# Patient Record
Sex: Female | Born: 1945 | Race: Black or African American | Hispanic: No | Marital: Single | Smoking: Never smoker
Health system: Southern US, Community
[De-identification: ages and names within clinical notes are randomized; demographics above are authoritative.]

## PROBLEM LIST (undated history)

## (undated) DIAGNOSIS — I639 Cerebral infarction, unspecified: Secondary | ICD-10-CM

## (undated) DIAGNOSIS — E119 Type 2 diabetes mellitus without complications: Secondary | ICD-10-CM

## (undated) DIAGNOSIS — R4701 Aphasia: Secondary | ICD-10-CM

## (undated) DIAGNOSIS — E7521 Fabry (-Anderson) disease: Secondary | ICD-10-CM

## (undated) DIAGNOSIS — F329 Major depressive disorder, single episode, unspecified: Secondary | ICD-10-CM

## (undated) DIAGNOSIS — Z720 Tobacco use: Secondary | ICD-10-CM

## (undated) DIAGNOSIS — I4891 Unspecified atrial fibrillation: Secondary | ICD-10-CM

## (undated) DIAGNOSIS — I10 Essential (primary) hypertension: Secondary | ICD-10-CM

## (undated) DIAGNOSIS — F32A Depression, unspecified: Secondary | ICD-10-CM

## (undated) HISTORY — DX: Unspecified atrial fibrillation: I48.91

## (undated) HISTORY — DX: Aphasia: R47.01

## (undated) HISTORY — DX: Fabry (-anderson) disease: E75.21

## (undated) HISTORY — DX: Depression, unspecified: F32.A

## (undated) HISTORY — DX: Type 2 diabetes mellitus without complications: E11.9

## (undated) HISTORY — DX: Tobacco use: Z72.0

## (undated) HISTORY — DX: Major depressive disorder, single episode, unspecified: F32.9

---

## 1988-11-18 HISTORY — PX: OTHER SURGICAL HISTORY: SHX169

## 2006-02-28 ENCOUNTER — Emergency Department (HOSPITAL_COMMUNITY): Admission: EM | Admit: 2006-02-28 | Discharge: 2006-02-28 | Payer: Self-pay | Admitting: Family Medicine

## 2006-08-06 ENCOUNTER — Ambulatory Visit (HOSPITAL_COMMUNITY): Admission: RE | Admit: 2006-08-06 | Discharge: 2006-08-06 | Payer: Self-pay | Admitting: Obstetrics

## 2006-08-09 ENCOUNTER — Encounter: Admission: RE | Admit: 2006-08-09 | Discharge: 2006-08-09 | Payer: Self-pay | Admitting: Obstetrics

## 2006-08-28 ENCOUNTER — Encounter: Admission: RE | Admit: 2006-08-28 | Discharge: 2006-08-28 | Payer: Self-pay | Admitting: Obstetrics

## 2007-12-16 ENCOUNTER — Other Ambulatory Visit: Admission: RE | Admit: 2007-12-16 | Discharge: 2007-12-16 | Payer: Self-pay | Admitting: Gynecology

## 2007-12-26 ENCOUNTER — Ambulatory Visit (HOSPITAL_COMMUNITY): Admission: RE | Admit: 2007-12-26 | Discharge: 2007-12-26 | Payer: Self-pay | Admitting: Family Medicine

## 2007-12-30 ENCOUNTER — Encounter: Payer: Self-pay | Admitting: Gynecology

## 2007-12-30 ENCOUNTER — Ambulatory Visit (HOSPITAL_COMMUNITY): Admission: RE | Admit: 2007-12-30 | Discharge: 2007-12-30 | Payer: Self-pay | Admitting: Gynecology

## 2008-01-20 ENCOUNTER — Ambulatory Visit: Payer: Self-pay | Admitting: Gastroenterology

## 2009-08-23 ENCOUNTER — Encounter: Admission: RE | Admit: 2009-08-23 | Discharge: 2009-08-23 | Payer: Self-pay | Admitting: Family Medicine

## 2009-08-26 ENCOUNTER — Ambulatory Visit (HOSPITAL_COMMUNITY): Admission: RE | Admit: 2009-08-26 | Discharge: 2009-08-26 | Payer: Self-pay | Admitting: Family Medicine

## 2009-10-21 ENCOUNTER — Encounter: Payer: Self-pay | Admitting: Gastroenterology

## 2009-11-24 ENCOUNTER — Encounter (INDEPENDENT_AMBULATORY_CARE_PROVIDER_SITE_OTHER): Payer: Self-pay | Admitting: *Deleted

## 2009-11-25 ENCOUNTER — Encounter (INDEPENDENT_AMBULATORY_CARE_PROVIDER_SITE_OTHER): Payer: Self-pay | Admitting: *Deleted

## 2009-11-25 ENCOUNTER — Ambulatory Visit: Payer: Self-pay | Admitting: Gastroenterology

## 2009-12-09 ENCOUNTER — Ambulatory Visit: Payer: Self-pay | Admitting: Gastroenterology

## 2010-04-10 ENCOUNTER — Encounter: Payer: Self-pay | Admitting: Obstetrics

## 2010-04-19 NOTE — Letter (Signed)
Summary: Previsit letter  [facility]  [address] [place], [place] [address]   Phone: [phone]  Fax: [phone]       10/21/2009 MRN: [id number]  [place] [address] [place], [place]  [address]  Dear Ms. Mayford Knife,  Welcome to the Gastroenterology Division at [facility].    You are scheduled to see a nurse for your pre-procedure visit on 11-25-09 at 9AM on the 3rd floor at [facility], [address]  We ask that you try to arrive at our office 15 minutes prior to your appointment time to allow for check-in.  Your nurse visit will consist of discussing your medical and surgical history, your immediate family medical history, and your medications.    Please bring a complete list of all your medications or, if you prefer, bring the medication bottles and we will list them.  We will need to be aware of both prescribed and over the counter drugs.  We will need to know exact dosage information as well.  If you are on blood thinners (Coumadin, Plavix, Aggrenox, Ticlid, etc.) please call our office today/prior to your appointment, as we need to consult with your physician about holding your medication.   Please be prepared to read and sign documents such as consent forms, a financial agreement, and acknowledgement forms.  If necessary, and with your consent, a friend or relative is welcome to sit-in on the nurse visit with you.  Please bring your insurance card so that we may make a copy of it.  If your insurance requires a referral to see a specialist, please bring your referral form from your primary care physician.  No co-pay is required for this nurse visit.     If you cannot keep your appointment, please call [phone] to cancel or reschedule prior to your appointment date.  This allows [place] the opportunity to schedule an appointment for another patient in need of care.    Thank you for choosing [facility] for your medical  needs.  We appreciate the opportunity to care for you.  Please visit [place] at our website  to learn more about our practice.                     Sincerely.                                                                                                                   The Gastroenterology Division

## 2010-04-19 NOTE — Letter (Signed)
Summary: Diabetic Instructions  [facility]  [address] [place], [place] [address]   Phone: [phone]  Fax: [phone]    Rebecca [name] 08-16-1945 MRN: [id number]   _  _   ORAL DIABETIC MEDICATION INSTRUCTIONS  The day before your procedure:   Take your diabetic pill as you do normally  The day of your procedure:   Do not take your diabetic pill    We will check your blood sugar levels during the admission process and again in Recovery before discharging you home  ________________________________________________________________________

## 2010-04-19 NOTE — Letter (Signed)
Summary: [facility] Instructions  [facility]  [address] [place], [place] [address]   Phone: [phone]  Fax: [phone]       Rebecca [name]    04-18-1945    MRN: [id number]        Procedure Day /Date:  Thursday 12/09/2009     Arrival Time: 9:00 am      Procedure Time: 10:00 am     Location of Procedure:                    _x _   Endoscopy Center (4th Floor)                        PREPARATION FOR COLONOSCOPY WITH MOVIPREP   Starting 5 days prior to your procedure Saturday 9/17 do not eat nuts, seeds, popcorn, corn, beans, peas,  salads, or any raw vegetables.  Do not take any fiber supplements (e.g. Metamucil, Citrucel, and Benefiber).  THE DAY BEFORE YOUR PROCEDURE         DATE: Wednesday 9/21  1.  Drink clear liquids the entire day-NO SOLID FOOD  2.  Do not drink anything colored red or purple.  Avoid juices with pulp.  No orange juice.  3.  Drink at least 64 oz. (8 glasses) of fluid/clear liquids during the day to prevent dehydration and help the prep work efficiently.  CLEAR LIQUIDS INCLUDE: Water Jello Ice Popsicles Tea (sugar ok, no milk/cream) Powdered fruit flavored drinks Coffee (sugar ok, no milk/cream) Gatorade Juice: apple, white grape, white cranberry  Lemonade Clear bullion, consomm, broth Carbonated beverages (any kind) Strained chicken noodle soup Hard Candy                             4.  In the morning, mix first dose of MoviPrep solution:    Empty 1 Pouch A and 1 Pouch B into the disposable container    Add lukewarm drinking water to the top line of the container. Mix to dissolve    Refrigerate (mixed solution should be used within 24 hrs)  5.  Begin drinking the prep at 5:00 p.m. The MoviPrep container is divided by 4 marks.   Every 15 minutes drink the solution down to the next mark (approximately 8 oz) until the full liter is complete.   6.  Follow completed prep with 16 oz of clear liquid of your choice  (Nothing red or purple).  Continue to drink clear liquids until bedtime.  7.  Before going to bed, mix second dose of MoviPrep solution:    Empty 1 Pouch A and 1 Pouch B into the disposable container    Add lukewarm drinking water to the top line of the container. Mix to dissolve    Refrigerate  THE DAY OF YOUR PROCEDURE      DATE: Thursday 9/22  Beginning at 5:00 a.m. (5 hours before procedure):         1. Every 15 minutes, drink the solution down to the next mark (approx 8 oz) until the full liter is complete.  2. Follow completed prep with 16 oz. of clear liquid of your choice.    3. You may drink clear liquids until 8:00 am (2 HOURS BEFORE PROCEDURE).   MEDICATION INSTRUCTIONS  Unless otherwise instructed, you should take regular prescription medications with a small sip of water   as early as possible the morning of  your procedure.  Diabetic patients - see separate instructions.           OTHER INSTRUCTIONS  You will need a responsible adult at least 66 years of age to accompany you and drive you home.   This person must remain in the waiting room during your procedure.  Wear loose fitting clothing that is easily removed.  Leave jewelry and other valuables at home.  However, you may wish to bring a book to read or  an iPod/MP3 player to listen to music as you wait for your procedure to start.  Remove all body piercing jewelry and leave at home.  Total time from sign-in until discharge is approximately 2-3 hours.  You should go home directly after your procedure and rest.  You can resume normal activities the  day after your procedure.  The day of your procedure you should not:   Drive   Make legal decisions   Operate machinery   Drink alcohol   Return to work  You will receive specific instructions about eating, activities and medications before you leave.    The above instructions have been reviewed and explained to me by   Ezra Sites RN   September  66, 2011 9:26 AM     I fully understand and can verbalize these instructions _____________________________ Date _________

## 2010-04-19 NOTE — Procedures (Signed)
Summary: Colonoscopy  Patient: Rebecca [name] Note: All result statuses are Final unless otherwise noted.  Tests: (1) Colonoscopy (COL)   COL Colonoscopy           DONE     [facility]     [address]     [place], [place]  [address]           COLONOSCOPY PROCEDURE REPORT           PATIENT:  Rebecca [name], Rebecca [name]  MR#:  [id number]     BIRTHDATE:  10/02/1945, 64 yrs. old  GENDER:  female           ENDOSCOPIST:  Barbette Hair. Arlyce Dice, MD     Referred by:           PROCEDURE DATE:  12/09/2009     PROCEDURE:  Diagnostic Colonoscopy     ASA CLASS:  Class II     INDICATIONS:  1) Routine Risk Screening           MEDICATIONS:   Fentanyl 50 mcg IV, Versed 4 mg IV           DESCRIPTION OF PROCEDURE:   After the risks benefits and     alternatives of the procedure were thoroughly explained, informed     consent was obtained.  Digital rectal exam was performed and     revealed no abnormalities.   The LB PCF-H180AL [id number] endoscope     was introduced through the anus and advanced to the cecum, which     was identified by the ileocecal valve, without limitations.  The     quality of the prep was excellent, using MoviPrep.  The instrument     was then slowly withdrawn as the colon was fully examined.     <<PROCEDUREIMAGES>>           FINDINGS:  A normal appearing cecum, ileocecal valve, and     appendiceal orifice were identified. The ascending, hepatic     flexure, transverse, splenic flexure, descending, sigmoid colon,     and rectum appeared unremarkable (see image2, image4, image5,     image6, image7, image8, image10, image11, and image12).     Retroflexed views in the rectum revealed no abnormalities.    The     time to cecum =  9.25  minutes. The scope was then withdrawn (time     =  6.50  min) from the patient and the procedure completed.           COMPLICATIONS:  None           ENDOSCOPIC IMPRESSION:     1) Normal colon     RECOMMENDATIONS:     1) Continue current  colorectal screening recommendations for     "routine risk" patients with a repeat colonoscopy in 10 years.           REPEAT EXAM:  In 10 year(s) for Colonoscopy.           ______________________________     Barbette Hair. Arlyce Dice, MD           CC: Clyda Greener, MD           n.     Rosalie Doctor:   Barbette Hair. Kaplan at 12/09/2009 11:11 AM           Amado Coe, [phone]  Note: An exclamation mark (!) indicates a result that was not dispersed into the flowsheet. Document Creation Date: 12/09/2009 11:12 AM _______________________________________________________________________  (  1) Order result status: Final Collection or observation date-time: 12/09/2009 11:04 Requested date-time:  Receipt date-time:  Reported date-time:  Referring Physician:   Ordering Physician: Melvia Heaps [phone]) Specimen Source:  Source: Launa Grill Order Number: [id number] Lab site:   Appended Document: Colonoscopy    Clinical Lists Changes  Observations: Added new observation of COLONNXTDUE: 11/2019 (12/09/2009 14:07)

## 2010-04-19 NOTE — Miscellaneous (Signed)
Summary: [facility]  Clinical Lists Changes  Medications: Added new medication of MOVIPREP 100 GM  SOLR (PEG-KCL-NACL-NASULF-NA ASC-C) As per prep instructions. - Signed Rx of MOVIPREP 100 GM  SOLR (PEG-KCL-NACL-NASULF-NA ASC-C) As per prep instructions.;  #1 x 0;  Signed;  Entered by: Ezra Sites RN;  Authorized by: Louis Meckel MD;  Method used: Electronically to [facility] [address] [place], [place], [place]  [address], Ph: [phone], Fax: [phone] Observations: Added new observation of NKA: T (11/25/2009 8:35)    Prescriptions: MOVIPREP 100 GM  SOLR (PEG-KCL-NACL-NASULF-NA ASC-C) As per prep instructions.  #1 x 0   Entered by:   Ezra Sites RN   Authorized by:   Louis Meckel MD   Signed by:   Ezra Sites RN on 11/25/2009   Method used:   Electronically to        [facility] (retail)       [address]       [place], [place]  [address]       Ph: [phone]       Fax: [phone]   RxID:   [id number]

## 2010-06-02 LAB — GLUCOSE, CAPILLARY
Glucose-Capillary: 118 mg/dL — ABNORMAL HIGH (ref 70–99)
Glucose-Capillary: 63 mg/dL — ABNORMAL LOW (ref 70–99)
Glucose-Capillary: 74 mg/dL (ref 70–99)

## 2010-07-04 ENCOUNTER — Ambulatory Visit (HOSPITAL_COMMUNITY)
Admission: RE | Admit: 2010-07-04 | Discharge: 2010-07-04 | Disposition: A | Discharge status: Home / Self Care | Payer: Medicare Other | Source: Ambulatory Visit | Attending: Family Medicine | Admitting: Family Medicine

## 2010-07-04 ENCOUNTER — Other Ambulatory Visit (HOSPITAL_COMMUNITY): Payer: Self-pay | Admitting: Family Medicine

## 2010-07-04 DIAGNOSIS — R079 Chest pain, unspecified: Secondary | ICD-10-CM | POA: Insufficient documentation

## 2010-07-04 DIAGNOSIS — R52 Pain, unspecified: Secondary | ICD-10-CM

## 2010-07-04 DIAGNOSIS — R059 Cough, unspecified: Secondary | ICD-10-CM | POA: Insufficient documentation

## 2010-07-04 DIAGNOSIS — R05 Cough: Secondary | ICD-10-CM | POA: Insufficient documentation

## 2010-07-04 DIAGNOSIS — I517 Cardiomegaly: Secondary | ICD-10-CM | POA: Insufficient documentation

## 2010-07-04 LAB — CBC
Hemoglobin: 13.8 g/dL (ref 12.0–15.0)
MCH: 28.4 pg (ref 26.0–34.0)
MCV: 84 fL (ref 78.0–100.0)
Platelets: 148 10*3/uL — ABNORMAL LOW (ref 150–400)
WBC: 3.6 10*3/uL — ABNORMAL LOW (ref 4.0–10.5)

## 2010-07-04 LAB — LIPID PANEL
Triglycerides: 37 mg/dL (ref <150)
VLDL: 7 mg/dL (ref 0–40)

## 2010-07-04 LAB — COMPREHENSIVE METABOLIC PANEL
AST: 32 U/L (ref 0–37)
BUN: 23 mg/dL (ref 6–23)
Calcium: 8.9 mg/dL (ref 8.4–10.5)
GFR calc Af Amer: >60 mL/min (ref >60)
GFR calc non Af Amer: 52 mL/min — ABNORMAL LOW (ref >60)
Glucose, Bld: 94 mg/dL (ref 70–99)
Sodium: 140 mEq/L (ref 135–145)
Total Bilirubin: 0.7 mg/dL (ref 0.3–1.2)
Total Protein: 6.8 g/dL (ref 6.0–8.3)

## 2010-07-04 LAB — HEMOGLOBIN A1C: Hgb A1c MFr Bld: 6.2 % — ABNORMAL HIGH (ref <5.7)

## 2010-07-11 ENCOUNTER — Encounter: Payer: Medicare Other | Attending: Family Medicine | Admitting: *Deleted

## 2010-07-11 DIAGNOSIS — E789 Disorder of lipoprotein metabolism, unspecified: Secondary | ICD-10-CM | POA: Insufficient documentation

## 2010-07-11 DIAGNOSIS — Z713 Dietary counseling and surveillance: Secondary | ICD-10-CM | POA: Insufficient documentation

## 2010-07-11 DIAGNOSIS — I10 Essential (primary) hypertension: Secondary | ICD-10-CM | POA: Insufficient documentation

## 2010-08-08 ENCOUNTER — Ambulatory Visit: Payer: 59 | Admitting: *Deleted

## 2010-08-10 ENCOUNTER — Encounter: Payer: Medicare Other | Attending: Family Medicine | Admitting: *Deleted

## 2010-08-10 DIAGNOSIS — I10 Essential (primary) hypertension: Secondary | ICD-10-CM | POA: Insufficient documentation

## 2010-08-10 DIAGNOSIS — E789 Disorder of lipoprotein metabolism, unspecified: Secondary | ICD-10-CM | POA: Insufficient documentation

## 2010-08-10 DIAGNOSIS — Z713 Dietary counseling and surveillance: Secondary | ICD-10-CM | POA: Insufficient documentation

## 2010-09-13 ENCOUNTER — Encounter: Payer: Medicare Other | Attending: Family Medicine | Admitting: Dietician

## 2010-09-13 DIAGNOSIS — E789 Disorder of lipoprotein metabolism, unspecified: Secondary | ICD-10-CM | POA: Insufficient documentation

## 2010-09-13 DIAGNOSIS — Z713 Dietary counseling and surveillance: Secondary | ICD-10-CM | POA: Insufficient documentation

## 2010-09-13 DIAGNOSIS — I10 Essential (primary) hypertension: Secondary | ICD-10-CM | POA: Insufficient documentation

## 2010-11-10 ENCOUNTER — Ambulatory Visit: Payer: 59 | Admitting: *Deleted

## 2010-12-20 LAB — LIPID PANEL
Cholesterol: 244 — ABNORMAL HIGH
HDL: 81
LDL Cholesterol: 152 — ABNORMAL HIGH
Total CHOL/HDL Ratio: 3

## 2010-12-20 LAB — CBC
HCT: 40.6
MCV: 86.1
RBC: 4.72
RDW: 14.2

## 2010-12-20 LAB — COMPREHENSIVE METABOLIC PANEL
Albumin: 3.6
CO2: 28
Chloride: 109
GFR calc Af Amer: >60
Potassium: 4.1
Sodium: 143

## 2010-12-20 LAB — TSH: TSH: 2.299

## 2011-02-13 ENCOUNTER — Other Ambulatory Visit (HOSPITAL_COMMUNITY): Payer: Self-pay | Admitting: Family Medicine

## 2011-02-13 DIAGNOSIS — Z1231 Encounter for screening mammogram for malignant neoplasm of breast: Secondary | ICD-10-CM

## 2011-03-16 ENCOUNTER — Ambulatory Visit (HOSPITAL_COMMUNITY)
Admission: RE | Admit: 2011-03-16 | Discharge: 2011-03-16 | Disposition: A | Discharge status: Home / Self Care | Payer: Medicare Other | Source: Ambulatory Visit | Attending: Family Medicine | Admitting: Family Medicine

## 2011-03-16 DIAGNOSIS — Z1231 Encounter for screening mammogram for malignant neoplasm of breast: Secondary | ICD-10-CM

## 2011-08-10 ENCOUNTER — Other Ambulatory Visit (HOSPITAL_COMMUNITY)
Admission: RE | Admit: 2011-08-10 | Discharge: 2011-08-10 | Disposition: A | Discharge status: Home / Self Care | Payer: Medicare Other | Source: Ambulatory Visit | Attending: Obstetrics and Gynecology | Admitting: Obstetrics and Gynecology

## 2011-08-10 ENCOUNTER — Other Ambulatory Visit: Payer: Self-pay | Admitting: Nurse Practitioner

## 2011-08-10 DIAGNOSIS — Z1159 Encounter for screening for other viral diseases: Secondary | ICD-10-CM | POA: Insufficient documentation

## 2011-08-10 DIAGNOSIS — Z01419 Encounter for gynecological examination (general) (routine) without abnormal findings: Secondary | ICD-10-CM | POA: Insufficient documentation

## 2013-03-04 ENCOUNTER — Other Ambulatory Visit: Payer: Self-pay | Admitting: Interventional Cardiology

## 2013-03-19 ENCOUNTER — Other Ambulatory Visit: Payer: Self-pay

## 2013-03-19 MED ORDER — METOPROLOL SUCCINATE ER 25 MG PO TB24: 25.0000 mg | ORAL_TABLET | ORAL | Status: DC

## 2013-03-23 ENCOUNTER — Inpatient Hospital Stay (HOSPITAL_COMMUNITY)
Admission: EM | Admit: 2013-03-23 | Discharge: 2013-03-27 | DRG: 065 | Disposition: A | Discharge status: Home / Self Care | Payer: Medicare Other | Attending: Family Medicine | Admitting: Family Medicine

## 2013-03-23 ENCOUNTER — Emergency Department (HOSPITAL_COMMUNITY): Payer: Medicare Other

## 2013-03-23 ENCOUNTER — Encounter (HOSPITAL_COMMUNITY): Payer: Self-pay | Admitting: Emergency Medicine

## 2013-03-23 DIAGNOSIS — Z79899 Other long term (current) drug therapy: Secondary | ICD-10-CM

## 2013-03-23 DIAGNOSIS — I4891 Unspecified atrial fibrillation: Secondary | ICD-10-CM | POA: Diagnosis present

## 2013-03-23 DIAGNOSIS — I6340 Cerebral infarction due to embolism of unspecified cerebral artery: Principal | ICD-10-CM | POA: Diagnosis present

## 2013-03-23 DIAGNOSIS — Z96649 Presence of unspecified artificial hip joint: Secondary | ICD-10-CM

## 2013-03-23 DIAGNOSIS — R944 Abnormal results of kidney function studies: Secondary | ICD-10-CM | POA: Diagnosis present

## 2013-03-23 DIAGNOSIS — Z87891 Personal history of nicotine dependence: Secondary | ICD-10-CM

## 2013-03-23 DIAGNOSIS — R4701 Aphasia: Secondary | ICD-10-CM | POA: Diagnosis present

## 2013-03-23 DIAGNOSIS — I472 Ventricular tachycardia, unspecified: Secondary | ICD-10-CM | POA: Diagnosis present

## 2013-03-23 DIAGNOSIS — I700 Atherosclerosis of aorta: Secondary | ICD-10-CM | POA: Diagnosis present

## 2013-03-23 DIAGNOSIS — R748 Abnormal levels of other serum enzymes: Secondary | ICD-10-CM | POA: Diagnosis present

## 2013-03-23 DIAGNOSIS — I428 Other cardiomyopathies: Secondary | ICD-10-CM | POA: Diagnosis present

## 2013-03-23 DIAGNOSIS — I4729 Other ventricular tachycardia: Secondary | ICD-10-CM | POA: Diagnosis present

## 2013-03-23 DIAGNOSIS — E119 Type 2 diabetes mellitus without complications: Secondary | ICD-10-CM | POA: Diagnosis present

## 2013-03-23 DIAGNOSIS — I639 Cerebral infarction, unspecified: Secondary | ICD-10-CM

## 2013-03-23 DIAGNOSIS — I10 Essential (primary) hypertension: Secondary | ICD-10-CM | POA: Diagnosis present

## 2013-03-23 DIAGNOSIS — I441 Atrioventricular block, second degree: Secondary | ICD-10-CM | POA: Diagnosis present

## 2013-03-23 DIAGNOSIS — E785 Hyperlipidemia, unspecified: Secondary | ICD-10-CM | POA: Diagnosis present

## 2013-03-23 DIAGNOSIS — I2789 Other specified pulmonary heart diseases: Secondary | ICD-10-CM | POA: Diagnosis present

## 2013-03-23 HISTORY — DX: Aphasia: R47.01

## 2013-03-23 HISTORY — DX: Essential (primary) hypertension: I10

## 2013-03-23 LAB — POCT I-STAT, CHEM 8
BUN: 19 mg/dL (ref 6–23)
Calcium, Ion: 1.18 mmol/L (ref 1.13–1.30)
Chloride: 106 mEq/L (ref 96–112)
Creatinine, Ser: 1 mg/dL (ref 0.50–1.10)
Glucose, Bld: 107 mg/dL — ABNORMAL HIGH (ref 70–99)
HCT: 46 % (ref 36.0–46.0)
Hemoglobin: 15.6 g/dL — ABNORMAL HIGH (ref 12.0–15.0)
Potassium: 3.8 mEq/L (ref 3.7–5.3)
SODIUM: 145 meq/L (ref 137–147)
TCO2: 25 mmol/L (ref 0–100)

## 2013-03-23 LAB — PROTIME-INR
INR: 1.02 (ref 0.00–1.49)
Prothrombin Time: 13.2 seconds (ref 11.6–15.2)

## 2013-03-23 LAB — TROPONIN I: Troponin I: <0.3 ng/mL (ref <0.30)

## 2013-03-23 LAB — POCT I-STAT TROPONIN I: Troponin i, poc: 0.13 ng/mL (ref 0.00–0.08)

## 2013-03-23 LAB — COMPREHENSIVE METABOLIC PANEL
ALK PHOS: 37 U/L — AB (ref 39–117)
ALT: 13 U/L (ref 0–35)
AST: 28 U/L (ref 0–37)
Albumin: 3.8 g/dL (ref 3.5–5.2)
BILIRUBIN TOTAL: 0.6 mg/dL (ref 0.3–1.2)
BUN: 18 mg/dL (ref 6–23)
CHLORIDE: 104 meq/L (ref 96–112)
CO2: 25 mEq/L (ref 19–32)
Calcium: 9.3 mg/dL (ref 8.4–10.5)
Creatinine, Ser: 0.89 mg/dL (ref 0.50–1.10)
GFR, EST AFRICAN AMERICAN: 75 mL/min — AB (ref >90)
GFR, EST NON AFRICAN AMERICAN: 65 mL/min — AB (ref >90)
GLUCOSE: 103 mg/dL — AB (ref 70–99)
Potassium: 3.9 mEq/L (ref 3.7–5.3)
Sodium: 144 mEq/L (ref 137–147)
Total Protein: 8.1 g/dL (ref 6.0–8.3)

## 2013-03-23 LAB — DIFFERENTIAL
Basophils Absolute: 0 10*3/uL (ref 0.0–0.1)
Basophils Relative: 1 % (ref 0–1)
Eosinophils Absolute: 0 10*3/uL (ref 0.0–0.7)
Eosinophils Relative: 1 % (ref 0–5)
LYMPHS ABS: 1.4 10*3/uL (ref 0.7–4.0)
Lymphocytes Relative: 36 % (ref 12–46)
MONO ABS: 0.5 10*3/uL (ref 0.1–1.0)
MONOS PCT: 12 % (ref 3–12)
NEUTROS ABS: 2 10*3/uL (ref 1.7–7.7)
Neutrophils Relative %: 51 % (ref 43–77)

## 2013-03-23 LAB — URINE MICROSCOPIC-ADD ON

## 2013-03-23 LAB — ETHANOL: Alcohol, Ethyl (B): <11 mg/dL (ref 0–11)

## 2013-03-23 LAB — URINALYSIS, ROUTINE W REFLEX MICROSCOPIC
Bilirubin Urine: NEGATIVE
Glucose, UA: NEGATIVE mg/dL
Hgb urine dipstick: NEGATIVE
Ketones, ur: 15 mg/dL — AB
Leukocytes, UA: NEGATIVE
Nitrite: NEGATIVE
PH: 5.5 (ref 5.0–8.0)
PROTEIN: 100 mg/dL — AB
SPECIFIC GRAVITY, URINE: 1.019 (ref 1.005–1.030)
Urobilinogen, UA: 0.2 mg/dL (ref 0.0–1.0)

## 2013-03-23 LAB — RAPID URINE DRUG SCREEN, HOSP PERFORMED
Amphetamines: NOT DETECTED
Barbiturates: NOT DETECTED
Benzodiazepines: NOT DETECTED
Cocaine: NOT DETECTED
Opiates: NOT DETECTED
Tetrahydrocannabinol: NOT DETECTED

## 2013-03-23 LAB — CBC
HEMATOCRIT: 41 % (ref 36.0–46.0)
HEMOGLOBIN: 13.9 g/dL (ref 12.0–15.0)
MCH: 28.8 pg (ref 26.0–34.0)
MCHC: 33.9 g/dL (ref 30.0–36.0)
MCV: 84.9 fL (ref 78.0–100.0)
Platelets: 187 10*3/uL (ref 150–400)
RBC: 4.83 MIL/uL (ref 3.87–5.11)
RDW: 14.9 % (ref 11.5–15.5)
WBC: 4 10*3/uL (ref 4.0–10.5)

## 2013-03-23 LAB — GLUCOSE, CAPILLARY
GLUCOSE-CAPILLARY: 167 mg/dL — AB (ref 70–99)
Glucose-Capillary: 103 mg/dL — ABNORMAL HIGH (ref 70–99)

## 2013-03-23 LAB — APTT: aPTT: 21 seconds — ABNORMAL LOW (ref 24–37)

## 2013-03-23 MED ORDER — ONDANSETRON HCL 4 MG/2ML IJ SOLN: 4.0000 mg | Freq: Three times a day (TID) | INTRAMUSCULAR | Status: AC | PRN

## 2013-03-23 MED ORDER — ACETAMINOPHEN 650 MG RE SUPP: 650.0000 mg | RECTAL | Status: DC | PRN

## 2013-03-23 MED ORDER — ACETAMINOPHEN 325 MG PO TABS
650.0000 mg | ORAL_TABLET | ORAL | Status: DC | PRN
  Administered 2013-03-25: 650 mg via ORAL
  Filled 2013-03-23: qty 2

## 2013-03-23 MED ORDER — INSULIN ASPART 100 UNIT/ML ~~LOC~~ SOLN: 0.0000 [IU] | Freq: Three times a day (TID) | SUBCUTANEOUS | Status: DC

## 2013-03-23 MED ORDER — SODIUM CHLORIDE 0.45 % IV SOLN
INTRAVENOUS | Status: DC
  Administered 2013-03-23 – 2013-03-25 (×4): via INTRAVENOUS
  Administered 2013-03-26: 1000 mL via INTRAVENOUS

## 2013-03-23 MED ORDER — ASPIRIN EC 81 MG PO TBEC
81.0000 mg | DELAYED_RELEASE_TABLET | Freq: Every day | ORAL | Status: DC
  Administered 2013-03-23 – 2013-03-25 (×3): 81 mg via ORAL
  Filled 2013-03-23 (×3): qty 1

## 2013-03-23 NOTE — Consult Note (Signed)
NEURO HOSPITALIST CONSULT NOTE    Reason for Consult: aphasia  HPI:                                                                                                                                          Rebecca [name] is an 68 y.o. female, right handed, with a past medical history significant for HTN, DM, brought to [facility] ED by family for further evaluation of confusion and language disturbance. She denies having similar symptoms before. Last known well is uncertain, but around 1300 today she was at a local store and observed to be in a confused state and having difficulty expressing herself.  ED physician was unable to locate family member that lives with patient in order to establish her last known well. Upon arrival to the ED she was described as having expressive aphasia. Son is at the bedside and said that " my mother never had a problem like this, she is slow and not able to get things together as she used to". Denies HA, vertigo, double vision, focal weakness or numbness, unsteadiness, slurred speech, visual disturbances, or trouble with comprehension of language. CT brain pending.   No past medical history on file.  No past surgical history on file.  No family history on file.  Social History:  has no tobacco, alcohol, and drug history on file.  Allergies not on file  MEDICATIONS:                                                                                                                     I have reviewed the patient's current medications.   ROS:  History obtained from the patient and son  General ROS: negative for - chills, fatigue, fever, night sweats, weight gain or weight loss Psychological ROS: negative for - behavioral disorder, hallucinations, mood swings or suicidal ideation. Admits to having short term  memory problems. Ophthalmic ROS: negative for - blurry vision, double vision, eye pain or loss of vision ENT ROS: negative for - epistaxis, nasal discharge, oral lesions, sore throat, tinnitus or vertigo Allergy and Immunology ROS: negative for - hives or itchy/watery eyes Hematological and Lymphatic ROS: negative for - bleeding problems, bruising or swollen lymph nodes Endocrine ROS: negative for - galactorrhea, hair pattern changes, polydipsia/polyuria or temperature intolerance Respiratory ROS: negative for - cough, hemoptysis, shortness of breath or wheezing Cardiovascular ROS: negative for - chest pain, dyspnea on exertion, edema or irregular heartbeat Gastrointestinal ROS: negative for - abdominal pain, diarrhea, hematemesis, nausea/vomiting or stool incontinence Genito-Urinary ROS: negative for - dysuria, hematuria, incontinence or urinary frequency/urgency Musculoskeletal ROS: negative for - joint swelling or muscular weakness Neurological ROS: as noted in HPI Dermatological ROS: negative for rash and skin lesion changes   Physical exam: pleasant female in no apparent distress. Blood pressure 158/115, pulse 72, temperature 98.7 F (37.1 C), temperature source Oral, resp. rate 17, SpO2 100.00%. Head: normocephalic. Neck: supple, no bruits, no JVD. Cardiac: no murmurs. Lungs: clear. Abdomen: soft, no tender, no mass. Extremities: no edema.  Neurologic Examination:                                                                                                      Mental Status: Alert, awake, oriented x 4, thought content appropriate. Comprehension intact but she is non fluent due to expressive dysphasia.  Able to follow 3 step commands without difficulty. Cranial Nerves: II: Discs flat bilaterally; Visual fields grossly normal, pupils equal, round, reactive to light and accommodation III,IV, VI: ptosis not present, extra-ocular motions intact bilaterally V,VII: smile symmetric,  facial light touch sensation normal bilaterally VIII: hearing normal bilaterally IX,X: gag reflex present XI: bilateral shoulder shrug XII: midline tongue extension without atrophy or fasciculations  Motor: Right : Upper extremity   5/5    Left:     Upper extremity   5/5  Lower extremity   5/5     Lower extremity   5/5 Tone and bulk:normal tone throughout; no atrophy noted Sensory: Pinprick and light touch intact throughout, bilaterally Deep Tendon Reflexes:  Right: Upper Extremity   Left: Upper extremity   biceps (C-5 to C-6) 2/4   biceps (C-5 to C-6) 2/4 tricep (C7) 2/4    triceps (C7) 2/4 Brachioradialis (C6) 2/4  Brachioradialis (C6) 2/4  Lower Extremity Lower Extremity  quadriceps (L-2 to L-4) 2/4   quadriceps (L-2 to L-4) 2/4 Achilles (S1) 2/4   Achilles (S1) 2/4  Plantars: Right: downgoing   Left: downgoing Cerebellar: normal finger-to-nose,  normal heel-to-shin test Gait:  No ataxia. CV: pulses palpable throughout    Lab Results  Component Value Date/Time   CHOL  Value: 150        ATP III CLASSIFICATION:  <200  mg/dL   Desirable  [id number]  mg/dL   Borderline High  >=045>=240    mg/dL   High        4/09/81194/16/2012  8:47 AM    Results for orders placed during the hospital encounter of 03/23/13 (from the past 48 hour(s))  GLUCOSE, CAPILLARY     Status: Abnormal   Collection Time    03/23/13  2:18 PM      Result Value Range   Glucose-Capillary 103 (*) 70 - 99 mg/dL   Comment 1 Documented in Chart     Comment 2 Notify RN      No results found.  Assessment/Plan: 68 y/o with PMH HTN, DM, comes in with acute expressive dysphasia without other associated lateralizing signs on neuro-exam. CT brain pending. Last known well uncertain and thus thrombolysis was not considered. 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 Admit to medicine and complete stroke work up. ASA 81 mg pending results of neuro testing. Will follow up.  Wyatt Portelasvaldo Camilo, MD 03/23/2013, 3:02 PM

## 2013-03-23 NOTE — ED Provider Notes (Signed)
CSN: [id number]     Arrival date & time 03/23/13  1400 History   First MD Initiated Contact with Patient 03/23/13 1420     Chief Complaint  Patient presents with  . Cerebrovascular Accident    HPI  PT was observed at store today at 1300 in a confused state. Unable to locate family member who lives with PT. Last seen normal is unknown at this time. [facility]iga       No past medical history on file. No past surgical history on file. No family history on file. History  Substance Use Topics  . Smoking status: Not on file  . Smokeless tobacco: Not on file  . Alcohol Use: Not on file   OB History   No data available     Review of Systems  Unable to perform ROS: Other    Allergies  Review of patient's allergies indicates not on file.  Home Medications   Current Outpatient Rx  Name  Route  Sig  Dispense  Refill  . metoprolol succinate (TOPROL-XL) 25 MG 24 hr tablet   Oral   Take 1 tablet (25 mg total) by mouth 3 (three) times a week.   36 tablet   1    BP 158/115  Pulse 72  Temp(Src) 98.7 F (37.1 C) (Oral)  Resp 17  SpO2 100% Physical Exam  Nursing note and vitals reviewed. Constitutional: She appears well-developed and well-nourished. No distress.  HENT:  Head: Normocephalic and atraumatic.  Eyes: Pupils are equal, round, and reactive to light.  Neck: Normal range of motion.  Cardiovascular: Normal rate and intact distal pulses.   Pulmonary/Chest: No respiratory distress.  Abdominal: Normal appearance. She exhibits no distension.  Musculoskeletal: Normal range of motion.  Neurological: She is alert. No cranial nerve deficit.  Neurologic Examination:                                                                                                      Mental Status: Alert, awake, oriented x 4, thought content appropriate. Comprehension intact but she is non fluent due to expressive dysphasia.  Able to follow 3 step commands  without difficulty. Cranial Nerves: II: Discs flat bilaterally; Visual fields grossly normal, pupils equal, round, reactive to light and accommodation III,IV, VI: ptosis not present, extra-ocular motions intact bilaterally V,VII: smile symmetric, facial light touch sensation normal bilaterally VIII: hearing normal bilaterally IX,X: gag reflex present XI: bilateral shoulder shrug XII: midline tongue extension without atrophy or fasciculations   Motor: Right :  Upper extremity   5/5                                        Left:     Upper extremity   5/5  Cerebellar: normal finger-to-nose,  normal heel-to-shin test  Skin: Skin is warm and dry. No rash noted.  Psychiatric: She has a normal mood and affect. Her behavior is normal.    ED  Course  Procedures (including critical care time) Labs Review Labs Reviewed  GLUCOSE, CAPILLARY - Abnormal; Notable for the following:    Glucose-Capillary 103 (*)    All other components within normal limits  APTT - Abnormal; Notable for the following:    aPTT 21 (*)    All other components within normal limits  POCT I-STAT, CHEM 8 - Abnormal; Notable for the following:    Glucose, Bld 107 (*)    Hemoglobin 15.6 (*)    All other components within normal limits  POCT I-STAT TROPONIN I - Abnormal; Notable for the following:    Troponin i, poc 0.13 (*)    All other components within normal limits  PROTIME-INR  CBC  DIFFERENTIAL  ETHANOL  COMPREHENSIVE METABOLIC PANEL  TROPONIN I  URINE RAPID DRUG SCREEN (HOSP PERFORMED)  URINALYSIS, ROUTINE W REFLEX MICROSCOPIC  TROPONIN I  HEMOGLOBIN A1C  LIPID PANEL   Imaging Review No results found.  EKG Interpretation    Date/Time:  Sunday March 23 2013 14:01:29 EST Ventricular Rate:  76 PR Interval:  178 QRS Duration: 102 QT Interval:  432 QTC Calculation: 486 R Axis:   -40 Text Interpretation:  Sinus rhythm with Premature atrial complexes Left axis deviation Minimal voltage criteria for  LVH, may be normal variant T wave abnormality, consider lateral ischemia Prolonged QT Abnormal ECG No significant change since last tracing Confirmed by Thedore Pickel  MD, Joaopedro Eschbach ([id number]) on 03/23/2013 3:12:33 PM           Neurology saw the patient in emergency department.  Consult in order was written by neurology.  Abnormal slight elevation in troponin may be labs i.e.  No EKG or history which supports ACS.  Plan is to repeat and house troponin.  Will be followed by family medicine.  They were notified of abnormal value. MDM   1. Expressive aphasia        Nelia Shi, MD 03/23/13 [phone]

## 2013-03-23 NOTE — H&P (Signed)
Family Medicine Teaching [facility] Admission History and Physical Service Pager: [phone]  Patient name: Rebecca [name] Medical record number: [id number] Date of birth: January 15, 1946 Age: 68 y.o. Gender: female  Primary Care Provider: Burtis Junes, MD Consultants: Neurology  Code Status: Full  Chief Complaint: difficulty with speech  Assessment and Plan: Rebecca [name] is a 68 y.o. female presenting with acute onset expressive aphasia. 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 PMH is significant for HTN, DM, ?HLD - pt cannot provide home medication regimen and son is unsure of what medications she takes.  # Possible CVA/TIA: acute onset expressive aphasia, slight right arm pronator drift. Neurology consulted in ED and determined no indication for thrombolysis given time of onset unknown. Given symptoms suspected left MCA branch infarct. Tox screen negative. EKG with no significant changes per ED physician (no old tracings in epic to compare) - stroke/TIA workup: CT head without obvious acute changes but evidence for chronic periventricular white-matter disease / remote small infarcts  - lipid panel, a1c, carotid doppler, 2d echo, MRI/MRA  - f/u any further neuro recommendations (appreciated) - ASA 81mg  pending workup - PT/OT/SLP evals  # Elevated Troponin : POCT troponin slightly elevated 0.13 - two negative serum troponins in the ED and EKG / history not suggestive of ACS - follow serial serum troponin x3.  # Diabetes, HTN, ?HLD: unknown what home medication regimen is, son to bring in medications - SSI moderate until home regimen determined - monitor BP, permissive HTN given question of stroke, as above - f/u lipid panel, A1c  FEN/GI: 1/2NS at 100 mL/h, NPO pending bedside swallow eval Prophylaxis: SCD's for DVT prophylaxis with question of stroke  Disposition: admit to telemetry  History of Present Illness: Rebecca [name] is a 68 y.o. female presenting  with difficulty talking. Most of history per son, with patient contributing. Son says that he received a phone call from his mother's phone around 13:00 today but it was an employee at grocery store telling him that his mom was acting abnormally and had difficulty speaking. EMS arrived and brought her to ED where stroke workup was started. Patient does not describe the incident. She says she was feeling funny earlier today, but can't explain what she means; she states things felt like they were "going in slow motion" but is otherwise vague about any symptoms other than having difficulty "finding her voice." She denies chest pain, dizziness, loss of consciousness, weakness, any pain on her body. She denies any feelings of heart racing or palpitations.  Review Of Systems: Per HPI with the following additions: none Otherwise 12 point review of systems was performed and was unremarkable.  Patient Active Problem List   Diagnosis Date Noted  . Expressive aphasia 03/23/2013   Past Medical History: Hypertension Diabetes mellitus  Past Surgical History: Bilateral hip replacements  Social History: History  Substance Use Topics  . Smoking status: Not on file  . Smokeless tobacco: Not on file  . Alcohol Use: Not on file   Additional social history: none  Please also refer to relevant sections of EMR.  Family History: No family history on file. Allergies and Medications: Allergies not on file No current facility-administered medications on file prior to encounter.   Current Outpatient Prescriptions on File Prior to Encounter  Medication Sig Dispense Refill  . metoprolol succinate (TOPROL-XL) 25 MG 24 hr tablet Take 1 tablet (25 mg total) by mouth 3 (three) times a week.  36 tablet  1    Objective: BP 158/115  Pulse 72  Temp(Src) 98.7 F (37.1 C) (Oral)  Resp 17  SpO2 100% Exam: General: elderly adult female, alert, cooperative in NAD, lying in bed. HEENT: PERRL, EOMI. MMM.   Cardiovascular: RRR, normal heart sounds, no murmurs appreciated Respiratory: CTAB, very mild end expiratory wheezes without increased WOB Abdomen: soft, nontender, nondistended, bowel sounds present Extremities: no edema or cyanosis Skin: warm and dry Neuro: A+O. Expressive aphasia present, able to speak 1-5 word sentences (son says he has already noted improvement in speech). Follows commands. CN2-12 grossly normal. Grip strength, arm extension and flexion 5/5 bilaterally, toe extension and flexion 5/5 bilaterally. Slight pronator drift of right arm though pt did not keep eyes closed and required repeated instructions to keep hands supinated (pronated them voluntarily)  Labs and Imaging: CBC BMET   Recent Labs Lab 03/23/13 1440 03/23/13 1501  WBC 4.0  --   HGB 13.9 15.6*  HCT 41.0 46.0  PLT 187  --     Recent Labs Lab 03/23/13 1440 03/23/13 1501  NA 144 145  K 3.9 3.8  CL 104 106  CO2 25  --   BUN 18 19  CREATININE 0.89 1.00  GLUCOSE 103* 107*  CALCIUM 9.3  --       Recent Labs Lab 03/23/13 1440  INR 1.02  APTT 21*     Recent Labs Lab 03/23/13 1440 03/23/13 1514  TROPONINI <0.30 <0.30  POC troponin - 0.13 (positive) at 1459  Urine 1/4:  03/23/2013 15:20  Color, Urine YELLOW  APPearance CLOUDY (A)  Specific Gravity, Urine 1.019  pH 5.5  Glucose NEGATIVE  Bilirubin Urine NEGATIVE  Ketones, ur 15 (A)  Protein 100 (A)  Urobilinogen, UA 0.2  Nitrite NEGATIVE  Leukocytes, UA NEGATIVE  Hgb urine dipstick NEGATIVE  Urine-Other MUCOUS PRESENT  WBC, UA 3-6  Squamous Epithelial / LPF MANY (A)  Bacteria, UA FEW (A)  Casts HYALINE CASTS (A)   UDS and serum EtOH - negative  ED EKG - NSR, flipped lateral T-waves but no reported change from previous, per ED provider (previous EKG not visible in EPIC)  Imaging:  CT Head w/o contrast, 1/4 @1547  IMPRESSION:  Advanced periventricular white matter disease for age and evidence  of remote infarcts. No definite  acute hemispheric infarction or  intracranial hemorrhage. No mass lesions.   Tawni CarnesAndrew Wight, MD 03/23/2013, 4:10 PM PGY-1, [facility] FPTS Intern pager: [phone], text pages welcome  FPTS Upper-Level Resident Addendum  I have independently interviewed and examined the patient. I have discussed the above with Dr. Waynetta SandyWight and agree with his documentation as above. The above reflects his original note with my edits for correction/additions/clarification in orange. Please see also any attending notes.   Bobbye Mortonhristopher M Tihanna Goodson, MD PGY-2, [facility] FPTS Service pager: [phone] (text pages welcome through [facility])

## 2013-03-23 NOTE — ED Notes (Signed)
PT was observed at store today at 1300 in a confused state. Unable to locate family member who lives with PT. Last seen normal is unknown at this time. Pt arrived to [facility] Alert with expressive asphiga

## 2013-03-23 NOTE — Progress Notes (Signed)
Received call from tele around 1902 about patient having 7 beats of VTACH before resuming NSR at 61. Assessed patient who denied any symptomology. Paged resident on call, no orders received as of right now. This information passed on to oncoming shift.

## 2013-03-24 ENCOUNTER — Inpatient Hospital Stay (HOSPITAL_COMMUNITY): Payer: Medicare Other

## 2013-03-24 DIAGNOSIS — I10 Essential (primary) hypertension: Secondary | ICD-10-CM

## 2013-03-24 DIAGNOSIS — I6350 Cerebral infarction due to unspecified occlusion or stenosis of unspecified cerebral artery: Secondary | ICD-10-CM

## 2013-03-24 DIAGNOSIS — I359 Nonrheumatic aortic valve disorder, unspecified: Secondary | ICD-10-CM

## 2013-03-24 DIAGNOSIS — E119 Type 2 diabetes mellitus without complications: Secondary | ICD-10-CM

## 2013-03-24 DIAGNOSIS — R4701 Aphasia: Secondary | ICD-10-CM

## 2013-03-24 LAB — LIPID PANEL
Cholesterol: 202 mg/dL — ABNORMAL HIGH (ref 0–200)
HDL: 85 mg/dL (ref >39)
LDL Cholesterol: 104 mg/dL — ABNORMAL HIGH (ref 0–99)
Total CHOL/HDL Ratio: 2.4 RATIO
Triglycerides: 66 mg/dL (ref <150)
VLDL: 13 mg/dL (ref 0–40)

## 2013-03-24 LAB — HEMOGLOBIN A1C
HEMOGLOBIN A1C: 6.2 % — AB (ref <5.7)
Hgb A1c MFr Bld: 6.1 % — ABNORMAL HIGH (ref <5.7)
Mean Plasma Glucose: 128 mg/dL — ABNORMAL HIGH (ref <117)
Mean Plasma Glucose: 131 mg/dL — ABNORMAL HIGH (ref <117)

## 2013-03-24 LAB — URINE CULTURE

## 2013-03-24 LAB — GLUCOSE, CAPILLARY
GLUCOSE-CAPILLARY: 88 mg/dL (ref 70–99)
GLUCOSE-CAPILLARY: 85 mg/dL (ref 70–99)
Glucose-Capillary: 90 mg/dL (ref 70–99)
Glucose-Capillary: 93 mg/dL (ref 70–99)

## 2013-03-24 MED ORDER — ATORVASTATIN CALCIUM 40 MG PO TABS
40.0000 mg | ORAL_TABLET | Freq: Every day | ORAL | Status: DC
  Administered 2013-03-24 – 2013-03-26 (×3): 40 mg via ORAL
  Filled 2013-03-24 (×4): qty 1

## 2013-03-24 NOTE — H&P (Signed)
FMTS Attending Admission Note: Renold Don MD Personal pager:  [phone] FPTS Service Pager:  [phone]  I  have seen and examined this patient, reviewed their chart. I have discussed this patient with the resident. I agree with the resident's findings, assessment and care plan.  Additionally:  Briefly 68 yo Female with PMH of HTN, DM who presents with 1 day history of expressive aphasia.  Negative CT scan in ED.  Neuro saw in ED and likely Left sided MCA stroke.  Admitted to FPTS.    Exam: Gen:  Alert and oriented, NAD Heart:  Grade III SEM noted Lungs:  Clear  Ext:  Trace edema Bl Neuro:  Able to answer in 2 word sentences but struggles with anything more than this.  Follows commands.  Cranial nerves intact.  Strength and sensation intact throughout.    Imp/Plan: 1.  CVA: - most likely etiology - agree with stroke wu  2.  Elevated POC troponin: - Serials are negative after point of care  Tobey Grim, MD 03/24/2013 9:13 AM

## 2013-03-24 NOTE — Progress Notes (Signed)
PT Cancellation Note  Patient Details Name: Rebecca [name] MRN: [id number] DOB: 1945/07/04   Cancelled Treatment:    Reason Eval/Treat Not Completed: Patient at procedure or test/unavailable, will try back as time permits.   Fabio Asa 03/24/2013, 11:24 AM

## 2013-03-24 NOTE — Progress Notes (Signed)
VASCULAR LAB PRELIMINARY  PRELIMINARY  PRELIMINARY  PRELIMINARY  Carotid duplex  completed.    Preliminary report:  Bilateral:  1-39% ICA stenosis.  Vertebral artery flow is antegrade.      Dezman Granda, RVT 03/24/2013, 10:53 AM

## 2013-03-24 NOTE — Progress Notes (Signed)
   CARE MANAGEMENT NOTE 03/24/2013  Patient:  Rebecca [name], Rebecca [name]   Account Number:  [id number]  Date Initiated:  03/24/2013  Documentation initiated by:  Jiles Crocker  Subjective/Objective Assessment:   STROKE WORKUP     Action/Plan:   CM FOLLOWING FOR DCP   Anticipated DC Date:  03/28/2013   Anticipated DC Plan:  POSSIBLY HOME W HOME HEALTH SERVICES, AWAITIN FOR  PT/OT EVALS FOR DISPOSITION     DC Planning Services  CM consult          Status of service:  In process, will continue to follow Medicare Important Message given?  NA - LOS <3 / Initial given by admissions (If response is "NO", the following Medicare IM given date fields will be blank)  Per UR Regulation:  Reviewed for med. necessity/level of care/duration of stay  Comments:  1/5/2015Abelino Derrick RN,BSN,MHA [phone]

## 2013-03-24 NOTE — Discharge Summary (Signed)
Family Medicine Teaching [facility] Discharge Summary  Patient name: Rebecca [name] Medical record number: [id number] Date of birth: [birth year] Age: 68 y.o. Gender: female Date of Admission: 03/23/2013  Date of Discharge: 03/27/12 Admitting Physician: Tobey Grim, MD  Primary Care Provider: Burtis Junes, MD Consultants: Cardiology, Neurology   Indication for Hospitalization: Aphasia   Discharge Diagnoses/Problem List:  Patient Active Problem List   Diagnosis Date Noted  . Expressive aphasia 03/23/2013  . Diabetes mellitus 03/23/2013  . Hypertension 03/23/2013  . Decreased calculated GFR 03/23/2013    Disposition: home   Discharge Condition: stable   Brief Hospital Course:  Rebecca [name] is a 68 y.o. female presenting with acute onset expressive aphasia. 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 PMH is significant for HTN, DM.    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  CT head without obvious acute changes.  EKG showed NSR.  Due to results of lipid panel atorvastatin 40 mg daily (goal LDL < 70 for diabetics ) started. ASA 81 mg and plavix started for secondary stroke prevention. A 2D echo showed 60-65% EF, Pulmonary HTN, and Grade 2 diastolic dysfunction. Carotid Doppler revealed no evidence of hemodynamically significant internal carotid artery stenosis. Vertebral artery flow is antegrade. MRI revealed an acute anterior left MCA territory infarct.  TEE performed to evaluate for embolic source.  TEE showed no LA clot at the time of the study but there was strong evidence suggestive of recent AF and high risk of LA. Anticoagulation was recommended so Xarelto 20 mg daily was started and plavix was discontinued. ASA 81 mg was continued as well.  Home OT and SLP were established.   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 Checking alpha galactosidase to confirm.   # Elevated Troponin : POCT troponin slightly elevated 0.13 but denied any chest pain. Cycle troponins negative and no changes on EKG. ACS ruled out.   # Diabetes: Stable with HgbA1c 6.2. Discharged on home medications.   #HTN: Allowed for permissive hypertension in the setting of an acute stroke. Held home metroprolol. Discontinued metoprolol and started lisinopril and HCTZ. Titrated up to lisinopril 5 gm and HCTZ 25 mg.    #Social: Arranged home health options.   Issues for Follow Up:  1. Stroke: Patient has a 10-15% risk of having another stroke over the next year, the highest risk is within 2 weeks of the most recent stroke/TIA (risk of having a stroke following a stroke or TIA is the same).  Follow up with Dr. Pearlean Brownie, Stroke Clinic, in 2 months. 2. Chronic diseases: good control of sugars and HTN and continue statin. 3. Fabry's disease: results pending. Not a lot of data to guide deicsion re risk stratification for SCD with Fabry A paper from [facility] 2012 suggests tachyarrhythmia are rare; we dont know how to correlate with her very abnormal MR.   Significant Procedures: TEE   Significant Labs and Imaging:   Recent Labs Lab 03/23/13 1440 03/23/13 1501 03/26/13 1020  WBC 4.0  --  4.2  HGB 13.9 15.6* 13.3  HCT 41.0 46.0 39.7  PLT 187  --  189    Recent Labs Lab 03/23/13 1440 03/23/13 1501 03/26/13 1020  NA 144 145 145  K 3.9 3.8 4.1  CL 104  106 105  CO2 25  --  26  GLUCOSE 103* 107* 82  BUN 18 19 14   CREATININE 0.89 1.00 0.93  CALCIUM 9.3  --  8.9  ALKPHOS 37*  --   --   AST 28  --   --   ALT 13  --   --   ALBUMIN 3.8  --   --    Urine cx: multiple bacterial morphotype's present.   HgbA1c: 6.2  UDS and serum EtOH - negative  ED EKG - NSR, flipped lateral T-waves but no reported change from previous,  per ED provider (previous EKG not visible in EPIC)  CT Head w/o contrast, 1/4 @1547   IMPRESSION:  Advanced periventricular white matter disease for age and evidence  of remote infarcts. No definite acute hemispheric infarction or  intracranial hemorrhage. No mass lesions.  EXAM:  MRI HEAD WITHOUT CONTRAST  MRA HEAD WITHOUT CONTRAST  IMPRESSION:  1. Acute anterior left MCA territory infarct. No mass effect or  hemorrhage.  2. Advanced underlying chronic small and medium-sized vessel  ischemia throughout the brain.  3. Generalized intracranial artery dolichoectasia. No left MCA  branch occlusion or focal stenosis identified. Artifactual signal  loss in the proximal basilar artery  2D Echocardiogram EF 60-65%. No thrombus. Possible small PFO by color doppler. Moderate pulmonary hypertension  Carotid Doppler No evidence of hemodynamically significant internal carotid artery stenosis. Vertebral artery flow is antegrade.   TEE 03/26/2013 Severely dilated left atrium, large LA independent with borderline emptying velocities, no LA thrombus, severe left ventricular hypertrophy with preserved systolic function, no significant valvular disease. There is strong evidence suggestive of recent atrial fibrillation and high risk of LA thrombus. Cardiologist recommends anti-coagulation.  Results/Tests Pending at Time of Discharge: Alpha galactosidase: pending   Discharge Medications:    Medication List    STOP taking these medications       metoprolol succinate 25 MG 24 hr tablet  Commonly known as:  TOPROL-XL      TAKE these medications       aspirin 81 MG chewable tablet  Commonly known as:  ASPIRIN CHILDRENS  Chew 1 tablet (81 mg total) by mouth daily.     atorvastatin 40 MG tablet  Commonly known as:  LIPITOR  Take 1 tablet (40 mg total) by mouth daily at 6 PM.     hydrochlorothiazide 25 MG tablet  Commonly known as:  HYDRODIURIL  Take 1 tablet (25 mg total) by mouth daily.      lisinopril 5 MG tablet  Commonly known as:  PRINIVIL,ZESTRIL  Take 1 tablet (5 mg total) by mouth daily.     Rivaroxaban 20 MG Tabs tablet  Commonly known as:  XARELTO  Take 1 tablet (20 mg total) by mouth daily.        Discharge Instructions: Please refer to Patient Instructions section of EMR for full details.  Patient was counseled important signs and symptoms that should prompt return to medical care, changes in medications, dietary instructions, activity restrictions, and follow up appointments.   Follow-Up Appointments: Follow-up Information   Follow up with Burtis Junes, MD On 04/01/2013. (10:30)    Specialty:  Family Medicine   Contact information:   [address] [address] [place] [address] [phone]       Follow up with [facility] [address] (Our office will call you to schedule date and time to pick up cardiac event monitor)    Specialty:  Cardiology   Contact  information:   [address], [address] [place] [address] [phone]      Follow up with Sherryl MangesSteven Klein, MD On 05/01/2013. (At 12:00 noon )    Specialty:  Cardiology   Contact information:   [address] [place] [address] [phone]       Myra RudeJeremy E Isa Hitz, MD 03/29/2013, 8:49 PM PGY-1, [facility]

## 2013-03-24 NOTE — Progress Notes (Signed)
PT Cancellation Note  Patient Details Name: Rebecca [name] MRN: [id number] DOB: 1946/03/16   Cancelled Treatment:    Reason Eval/Treat Not Completed: Patient at procedure or test/unavailable; pt once again off the floor. Will perform evaluation tomorrow am if patient available.   Fabio Asa 03/24/2013, 1:54 PM Charlotte Crumb, PT DPT  [phone]

## 2013-03-24 NOTE — Progress Notes (Signed)
Family Medicine Teaching Service Daily Progress Note Intern Pager: [phone]  Patient name: Rebecca [name] Medical record number: [id number] Date of birth: [birth year] Age: 68 y.o. Gender: female  Primary Care Provider: Burtis Junes, MD Consultants: Neurology  Code Status: Full   Pt Overview and Major Events to Date:   Assessment and Plan: Rebecca [name] is a 68 y.o. female presenting with acute onset expressive aphasia. 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 PMH is significant for HTN, DM, ?HLD - pt cannot provide home medication regimen and son is unsure of what medications she takes.   # Possible CVA/TIA: unchanged since admission. Will follow up with studies.  - lipid panel --> consider high intensity - 2D echo - Carotid Doppler    - MRI/MRA  - ASA 81mg  pending workup  - PT/OT/SLP evals  - speech therapy c/s   # Elevated Troponin : POCT troponin slightly elevated 0.13  - two negative serum troponins in the ED and EKG / history not suggestive of ACS  - trop (-) x 2.   # Diabetes, HTN, ?HLD: unknown what home medication regimen is, son to bring in medications  - SSI moderate until home regimen determined  - monitor BP, permissive HTN given question of stroke, as above  - f/u lipid panel, A1c   FEN/GI: 1/2NS at 100 mL/h, NPO pending bedside swallow eval  Prophylaxis: SCD's for DVT prophylaxis with question of stroke   Disposition: pending improvement   Subjective: Unable to speak this AM. Seems frustrated with her situation. No acute events overnight.   Objective: Temp:  [97.8 F (36.6 C)-98.7 F (37.1 C)] 98 F (36.7 C) (01/05 0445) Pulse Rate:  [57-78] 69 (01/05 0445) Resp:  [12-22] 18 (01/05 0445) BP: (153-178)/(81-103) 157/95 mmHg (01/05 0445) SpO2:  [96 %-100 %] 96 % (01/05 0445) Weight:  [172 lb (78.019 kg)] 172 lb (78.019 kg) (01/04 1934) Physical Exam: General: elderly adult female, alert, cooperative in NAD, lying in bed.   Cardiovascular: RRR, normal heart sounds, no murmurs appreciated  Respiratory: CTAB, no wheezes or crackles.  Extremities: warm and well perfused.  Skin: warm and dry  Neuro: A+O. Expressive aphasia present, able to speak 1-5 word sentences (Seems unchanged since admission). Follows commands. Grip strength,   Laboratory:  Recent Labs Lab 03/23/13 1440 03/23/13 1501  WBC 4.0  --   HGB 13.9 15.6*  HCT 41.0 46.0  PLT 187  --     Recent Labs Lab 03/23/13 1440 03/23/13 1501  NA 144 145  K 3.9 3.8  CL 104 106  CO2 25  --   BUN 18 19  CREATININE 0.89 1.00  CALCIUM 9.3  --   PROT 8.1  --   BILITOT 0.6  --   ALKPHOS 37*  --   ALT 13  --   AST 28  --   GLUCOSE 103* 107*   Lipid Panel     Component Value Date/Time   CHOL 202* 03/24/2013 0350   TRIG 66 03/24/2013 0350   HDL 85 03/24/2013 0350   CHOLHDL 2.4 03/24/2013 0350   VLDL 13 03/24/2013 0350   LDLCALC 104* 03/24/2013 0350    Recent Labs Lab 03/23/13 1418 03/23/13 2138  GLUCAP 103* 167*    Urine cx: pending   HgbA1c: pending   Imaging/Diagnostic Tests:   Myra Rude, MD 03/24/2013, 6:57 AM PGY-1, [place] [facility] FPTS Intern pager: [phone], text pages welcome

## 2013-03-24 NOTE — Progress Notes (Signed)
FMTS Attending  Note: Kinan Safley,MD I  have seen and examined this patient, reviewed their chart. I have discussed this patient with the resident. I agree with the resident's findings, assessment and care plan.  Awaiting MRI/MRA as part of stroke work up. Carotid doppler and ECHO as well, will follow report. Continue ASA 81 mg consider Statin.

## 2013-03-24 NOTE — Progress Notes (Signed)
Patient had 7 beats of V. Tach. Patient remain asymptomatic.Vitals signs are stable. Dr.Grunz notified. No new orders at this time.

## 2013-03-24 NOTE — Progress Notes (Signed)
2D Echo performed  03/24/2013 mlm  

## 2013-03-24 NOTE — Progress Notes (Signed)
NEURO HOSPITALIST PROGRESS NOTE   SUBJECTIVE:                                                                                                                        Remains unchanged, can not speak fluently. No other complains. MRI brain pending.  OBJECTIVE:                                                                                                                           Vital signs in last 24 hours: Temp:  [97.8 F (36.6 C)-98.7 F (37.1 C)] 98 F (36.7 C) (01/05 0445) Pulse Rate:  [57-78] 69 (01/05 0445) Resp:  [12-22] 18 (01/05 0445) BP: (153-178)/(81-103) 157/95 mmHg (01/05 0445) SpO2:  [96 %-100 %] 96 % (01/05 0445) Weight:  [78.019 kg (172 lb)] 78.019 kg (172 lb) (01/04 1934)  Intake/Output from previous day:   Intake/Output this shift: Total I/O In: 120 [P.O.:120] Out: -  Nutritional status: Carb Control  Past Medical History  Diagnosis Date  . Hypertension     Neurologic Exam:  Mental Status:  Alert, awake, oriented x 4, thought content appropriate. Comprehension intact but she is non fluent due to expressive dysphasia.  Can not write a sentence unless I dictate her what to write. Able to follow 3 step commands without difficulty.  Cranial Nerves:  II: Discs flat bilaterally; Visual fields grossly normal, pupils equal, round, reactive to light and accommodation  III,IV, VI: ptosis not present, extra-ocular motions intact bilaterally  V,VII: smile symmetric, facial light touch sensation normal bilaterally  VIII: hearing normal bilaterally  IX,X: gag reflex present  XI: bilateral shoulder shrug  XII: midline tongue extension without atrophy or fasciculations  Motor:  Right : Upper extremity 5/5 Left: Upper extremity 5/5  Lower extremity 5/5 Lower extremity 5/5  Tone and bulk:normal tone throughout; no atrophy noted  Sensory: Pinprick and light touch intact throughout, bilaterally  Deep Tendon Reflexes:  Right:  Upper Extremity Left: Upper extremity  biceps (C-5 to C-6) 2/4 biceps (C-5 to C-6) 2/4  tricep (C7) 2/4 triceps (C7) 2/4  Brachioradialis (C6) 2/4 Brachioradialis (C6) 2/4  Lower Extremity Lower Extremity  quadriceps (L-2 to L-4) 2/4 quadriceps (L-2 to L-4) 2/4  Achilles (S1) 2/4  Achilles (S1) 2/4  Plantars:  Right: downgoing Left: downgoing  Cerebellar:  normal finger-to-nose, normal heel-to-shin test  Gait:  No ataxia.   Lab Results: Lab Results  Component Value Date/Time   CHOL 202* 03/24/2013  3:50 AM   Lipid Panel  Recent Labs  03/24/13 0350  CHOL 202*  TRIG 66  HDL 85  CHOLHDL 2.4  VLDL 13  LDLCALC 216*    Studies/Results: Ct Head Wo Contrast  03/23/2013   CLINICAL DATA:  Mental status change.  EXAM: CT HEAD WITHOUT CONTRAST  TECHNIQUE: Contiguous axial images were obtained from the base of the skull through the vertex without intravenous contrast.  COMPARISON:  None.  FINDINGS: The ventricles are in the midline without mass effect or shift. Mild ventriculomegaly not out of proportion to the degree of cerebral atrophy. Fairly extensive periventricular white matter disease and probable remote infarct in the left centrum semiovale and also in the right frontal an posterior parietal regions. I do not see any definite CT findings for acute hemispheric infarction or intracranial hemorrhage. No mass lesions. The brainstem and cerebellum are grossly normal. Moderate vascular calcifications are noted.  No acute bony findings. The paranasal sinuses and mastoid air cells are clear. The globes are intact.  IMPRESSION: Advanced periventricular white matter disease for age and evidence of remote infarcts. No definite acute hemispheric infarction or intracranial hemorrhage. No mass lesions.   Electronically Signed   By: Loralie Champagne M.D.   On: 03/23/2013 15:58    MEDICATIONS                                                                                                                        I have reviewed the patient's current medications.  ASSESSMENT/PLAN:                                                                                                            Acute non fluent dysphasia. Suspect left cortical infarct involving a branch left MCA. Awaiting MRI/MRA brain. Speech therapy consult. Continue aspirin. Will follow up.       Rebecca Portela, MD [facility] [phone]  03/24/2013, 8:55 AM

## 2013-03-25 LAB — GLUCOSE, CAPILLARY
GLUCOSE-CAPILLARY: 95 mg/dL (ref 70–99)
Glucose-Capillary: 89 mg/dL (ref 70–99)
Glucose-Capillary: 88 mg/dL (ref 70–99)
Glucose-Capillary: 100 mg/dL — ABNORMAL HIGH (ref 70–99)
Glucose-Capillary: 87 mg/dL (ref 70–99)

## 2013-03-25 MED ORDER — CLOPIDOGREL BISULFATE 75 MG PO TABS
75.0000 mg | ORAL_TABLET | Freq: Every day | ORAL | Status: DC
  Administered 2013-03-25 – 2013-03-27 (×3): 75 mg via ORAL
  Filled 2013-03-25 (×4): qty 1

## 2013-03-25 MED ORDER — LISINOPRIL 2.5 MG PO TABS
2.5000 mg | ORAL_TABLET | Freq: Every day | ORAL | Status: DC
  Administered 2013-03-25 – 2013-03-26 (×2): 2.5 mg via ORAL
  Filled 2013-03-25 (×2): qty 1

## 2013-03-25 MED ORDER — HYDROCHLOROTHIAZIDE 10 MG/ML ORAL SUSPENSION
6.2500 mg | Freq: Every day | ORAL | Status: DC
  Filled 2013-03-25: qty 1.25

## 2013-03-25 MED ORDER — HYDROCHLOROTHIAZIDE 12.5 MG PO CAPS
12.5000 mg | ORAL_CAPSULE | Freq: Every day | ORAL | Status: DC
  Administered 2013-03-25 – 2013-03-26 (×2): 12.5 mg via ORAL
  Filled 2013-03-25 (×2): qty 1

## 2013-03-25 NOTE — Consult Note (Signed)
ELECTROPHYSIOLOGY CONSULT NOTE  Patient ID: Rebecca CoeConstance Donoso MRN: [id number], DOB/AGE: 69/11/1945   Admit date: 03/23/2013 Date of Consult: 03/25/2013  Primary Physician: Burtis JunesBLOUNT,ALVIN VINCENT, MD Primary Cardiologist: [facility] Reason for Consultation: Cryptogenic stroke; recommendations regarding Implantable Loop Recorder  History of Present Illness Rebecca [name] is a 68 y.o. female with a past medical history significant for hypertension.  She was found to have confusion and speech changes on 03-23-13 and [facility].  Imaging demonstrated a left anterior MCA infarct.  No cause has been identified. Inpatient stroke work-up is to be completed with a TEE. EP has been asked to evaluate for placement of an implantable loop recorder to monitor for atrial fibrillation.  She reports intermittent palpitations that are not associated with dizziness or pre-syncope and are self limiting.  She denies chest pain.  She has chronic shortness of breath that she feels is related to prior smoking history.  She denies prior syncope.  She denies family history of sudden death.  She states that she has seen a cardiologist before but is unsure of who it was.  Telemetry demonstrates sinus rhythm with intermittent Mobitz I heart block, PVC's, short run NSVT.   Echo has demonstrated severe LVH LAE and Pulm HTN in context of elevated LVEDP  ROS is negative except as outlined above.   FAMILY HX Notable for FABRYS Disease manifested in her son with renal failure >> x linked recessive so that the mother is an obligate heterozygote  Past Medical History Past Medical History  Diagnosis Date  . Hypertension     Past Surgical History History reviewed. No pertinent past surgical history.  Allergies/Intolerances Not on File Inpatient Medications . atorvastatin  40 mg Oral q1800  . clopidogrel  75 mg Oral Q breakfast  . hydrochlorothiazide  12.5 mg Oral Daily  .  insulin aspart  0-15 Units Subcutaneous TID WC  . lisinopril  2.5 mg Oral Daily   . sodium chloride 100 mL/hr at 03/25/13 1619   Social History History   Social History  . Marital Status: Single    Spouse Name: N/A    Number of Children: N/A  . Years of Education: N/A   Occupational History  . Not on file.   Social History Main Topics  . Smoking status: Never Smoker   . Smokeless tobacco: Not on file  . Alcohol Use: Not on file  . Drug Use: Not on file  . Sexual Activity: Not on file   Other Topics Concern  . Not on file   Social History Narrative  . No narrative on file     Physical Exam Blood pressure 155/95, pulse 71, temperature 97.9 F (36.6 C), temperature source Oral, resp. rate 18, height 5\' 1"  (1.549 m), weight 172 lb (78.019 kg), SpO2 98.00%.  General: Well developed, well appearing 68 y.o. female in no acute distress. HEENT: Normocephalic, atraumatic. EOMs intact. Sclera nonicteric. Oropharynx clear.  Neck: Supple without bruits. No JVD. Lungs: Respirations regular and unlabored, CTA bilaterally. No wheezes, rales or rhonchi. Heart: RRR. S1, S2 present. No murmurs, rub, S3 or S4. Abdomen: Soft, non-tender, non-distended. BS present x 4 quadrants. No hepatosplenomegaly.  Extremities: No clubbing, cyanosis or edema. DP/PT/Radials 2+ and equal bilaterally. Psych: Normal affect. Neuro: Alert and oriented X 3. Moves all extremities spontaneously. Musculoskeletal: No kyphosis. Skin: Intact. Warm and dry. No rashes or petechiae in exposed areas.   Labs Lab Results  Component Value Date   WBC  4.0 03/23/2013   HGB 15.6* 03/23/2013   HCT 46.0 03/23/2013   MCV 84.9 03/23/2013   PLT 187 03/23/2013    Recent Labs Lab 03/23/13 1440 03/23/13 1501  NA 144 145  K 3.9 3.8  CL 104 106  CO2 25  --   BUN 18 19  CREATININE 0.89 1.00  CALCIUM 9.3  --   PROT 8.1  --   BILITOT 0.6  --   ALKPHOS 37*  --   ALT 13  --   AST 28  --   GLUCOSE 103* 107*    Recent Labs   03/23/13 1440  INR 1.02    Radiology/Studies Ct Head Wo Contrast 03/23/2013   CLINICAL DATA:  Mental status change.  EXAM: CT HEAD WITHOUT CONTRAST  TECHNIQUE: Contiguous axial images were obtained from the base of the skull through the vertex without intravenous contrast.  COMPARISON:  None.  FINDINGS: The ventricles are in the midline without mass effect or shift. Mild ventriculomegaly not out of proportion to the degree of cerebral atrophy. Fairly extensive periventricular white matter disease and probable remote infarct in the left centrum semiovale and also in the right frontal an posterior parietal regions. I do not see any definite CT findings for acute hemispheric infarction or intracranial hemorrhage. No mass lesions. The brainstem and cerebellum are grossly normal. Moderate vascular calcifications are noted.  No acute bony findings. The paranasal sinuses and mastoid air cells are clear. The globes are intact.  IMPRESSION: Advanced periventricular white matter disease for age and evidence of remote infarcts. No definite acute hemispheric infarction or intracranial hemorrhage. No mass lesions.   Electronically Signed   By: Loralie Champagne M.D.   On: 03/23/2013 15:58   Mr Shirlee Latch Wo Contrast 03/24/2013   CLINICAL DATA:  68 year old female with acute onset expressive aphasia, right upper extremity pronator drift. Initial encounter.  EXAM: MRI HEAD WITHOUT CONTRAST  MRA HEAD WITHOUT CONTRAST  TECHNIQUE: Multiplanar, multiecho pulse sequences of the brain and surrounding structures were obtained without intravenous contrast. Angiographic images of the head were obtained using MRA technique without contrast.  COMPARISON:  Head CT without contrast 03/23/2013.  FINDINGS: MRI HEAD FINDINGS  Confluent restricted diffusion in the left middle frontal gyrus tracking to the left operculum (series 4, image 19). Primarily cortex is affected with mild cortical swelling, T2 and FLAIR hyperintensity. No associated  hemorrhage or mass effect. Major intracranial vascular flow voids are preserved with intracranial artery dolichoectasia.  Underlying multiple chronic micro hemorrhages in the brain, primarily in the deep gray matter nuclei. Underlying confluent bilateral cerebral white matter T2 and FLAIR hyperintensity and small chronic cortically based infarcts in the right ACA and MCA territories. Chronic lacunar infarcts in the corona radiata, deep gray matter nuclei, and cerebellar hemispheres. Brainstem within normal limits.  No midline shift, mass effect, evidence of mass lesion, ventriculomegaly, or extra-axial collection. Cervicomedullary junction and pituitary are within normal limits. Negative visualized cervical spine. Normal bone marrow signal. Postoperative changes to the globes. Visualized paranasal sinuses and mastoids are clear. Visible internal auditory structures appear normal. Negative scalp soft tissues.  MRA HEAD FINDINGS  Antegrade flow in the distal vertebral arteries, the left is dominant. There is motion artifact at the skullbase. The vertebrobasilar junction and proximal basilar artery are tortuous, which is felt to account for signal loss in the proximal basilar artery which is thought to be artifactual. Normal flow signal in the distal basilar artery, SCA origins and PCA origins. Bilateral PCA branches  are within normal limits.  Antegrade flow in both ICA siphons which are mildly to moderately dolichoectatic, more so the right. No ICA stenosis. Normal carotid termini. Normal MCA and ACA origins. Tortuous bilateral ACA vessels with diminutive or absent anterior communicating artery. Normal left posterior communicating artery, the right is diminutive or absent. Bilateral MCA branches are normal except for mild dolichoectasia. No left MCA M1 or M2 branch stenosis or occlusion identified.  IMPRESSION: 1. Acute anterior left MCA territory infarct. No mass effect or hemorrhage. 2. Advanced underlying chronic  small and medium-sized vessel ischemia throughout the brain. 3. Generalized intracranial artery dolichoectasia. No left MCA branch occlusion or focal stenosis identified. Artifactual signal loss in the proximal basilar artery.   Electronically Signed   By: Augusto Gamble M.D.   On: 03/24/2013 15:35   Echocardiogram  03-24-2013 EF 60-65% with severe concentric hypertrophy - consider infiltrative or hypertrophic cardiomyopathy.  No RWMA, grade 2 diastolic dysfunction, moderate pulmonary hypertension, LA 54.   12-lead ECG sinus rhythm, rate 76, QT 432 Telemetry sinus rhythm, intermittent Mobitz I heart block, PVC's, short run NSVT   Assessment and Plan 1. 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 fabrys disease heterozygote  3. LV thickening with diastolic dysfunction 4. HTN 5. 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

## 2013-03-25 NOTE — Progress Notes (Signed)
Stroke Team Progress Note  HISTORY Rebecca [name] is an 68 y.o. female, right handed, with a past medical history significant for HTN, DM, brought to [facility] ED 03/23/2013 by family for further evaluation of confusion and language disturbance. She denies having similar symptoms before. Last known well is uncertain, but around 1300 today she was at a local store and observed to be in a confused state and having difficulty expressing herself.  ED physician was unable to locate family member that lives with patient in order to establish her last known well.  Upon arrival to the ED she was described as having expressive aphasia. Son is at the bedside and said that " my mother never had a problem like this, she is slow and not able to get things together as she used to".  Denies HA, vertigo, double vision, focal weakness or numbness, unsteadiness, slurred speech, visual disturbances, or trouble with comprehension of language.  CT brain pending.   Patient was not a TPA candidate secondary to unknown time last known well. She was admitted for further evaluation and treatment.  SUBJECTIVE No family is at the bedside.  Overall she feels her condition is gradually improving. She felt her symptoms at time of onset were very frustrating.  OBJECTIVE Most recent Vital Signs: Filed Vitals:   03/25/13 0227 03/25/13 0538 03/25/13 0944 03/25/13 0945  BP: 174/99 178/98 163/104 160/99  Pulse: 63 62  67  Temp: 98.1 F (36.7 C) 97.8 F (36.6 C)  97.6 F (36.4 C)  TempSrc: Oral Oral  Oral  Resp: 18 18  18   Height:      Weight:      SpO2: 100% 100%  98%   CBG (last 3)   Recent Labs  03/25/13 0700 03/25/13 0836 03/25/13 1126  GLUCAP 89 88 95    IV Fluid Intake:   . sodium chloride 100 mL/hr at 03/25/13 0122    MEDICATIONS  . aspirin EC  81 mg Oral Daily  . atorvastatin  40 mg Oral q1800  . clopidogrel  75 mg Oral Q breakfast  . hydrochlorothiazide  6.25 mg Oral Daily  . insulin aspart  0-15 Units  Subcutaneous TID WC  . lisinopril  2.5 mg Oral Daily   PRN:  acetaminophen, acetaminophen  Diet:  Carb Control thin liquids Activity:  OOB with assistance DVT Prophylaxis:  SCDs   CLINICALLY SIGNIFICANT STUDIES Basic Metabolic Panel:  Recent Labs Lab 03/23/13 1440 03/23/13 1501  NA 144 145  K 3.9 3.8  CL 104 106  CO2 25  --   GLUCOSE 103* 107*  BUN 18 19  CREATININE 0.89 1.00  CALCIUM 9.3  --    Liver Function Tests:  Recent Labs Lab 03/23/13 1440  AST 28  ALT 13  ALKPHOS 37*  BILITOT 0.6  PROT 8.1  ALBUMIN 3.8   CBC:  Recent Labs Lab 03/23/13 1440 03/23/13 1501  WBC 4.0  --   NEUTROABS 2.0  --   HGB 13.9 15.6*  HCT 41.0 46.0  MCV 84.9  --   PLT 187  --    Coagulation:  Recent Labs Lab 03/23/13 1440  LABPROT 13.2  INR 1.02   Cardiac Enzymes:  Recent Labs Lab 03/23/13 1440 03/23/13 1514  TROPONINI <0.30 <0.30   Urinalysis:  Recent Labs Lab 03/23/13 1520  COLORURINE YELLOW  LABSPEC 1.019  PHURINE 5.5  GLUCOSEU NEGATIVE  HGBUR NEGATIVE  BILIRUBINUR NEGATIVE  KETONESUR 15*  PROTEINUR 100*  UROBILINOGEN 0.2  NITRITE NEGATIVE  LEUKOCYTESUR NEGATIVE   Lipid Panel    Component Value Date/Time   CHOL 202* 03/24/2013 0350   TRIG 66 03/24/2013 0350   HDL 85 03/24/2013 0350   CHOLHDL 2.4 03/24/2013 0350   VLDL 13 03/24/2013 0350   LDLCALC 104* 03/24/2013 0350   HgbA1C  Lab Results  Component Value Date   HGBA1C 6.2* 03/24/2013    Urine Drug Screen:     Component Value Date/Time   LABOPIA NONE DETECTED 03/23/2013 1520   COCAINSCRNUR NONE DETECTED 03/23/2013 1520   LABBENZ NONE DETECTED 03/23/2013 1520   AMPHETMU NONE DETECTED 03/23/2013 1520   THCU NONE DETECTED 03/23/2013 1520   LABBARB NONE DETECTED 03/23/2013 1520    Alcohol Level:  Recent Labs Lab 03/23/13 1440  ETH <11    CT of the brain  03/23/2013  Advanced periventricular white matter disease for age and evidence of remote infarcts. No definite acute hemispheric infarction or intracranial  hemorrhage. No mass lesions.     MRI of the brain  03/24/2013   1. Acute anterior left MCA territory infarct. No mass effect or hemorrhage. 2. Advanced underlying chronic small and medium-sized vessel ischemia throughout the brain.   MRA of the brain  03/24/2013     Generalized intracranial artery dolichoectasia. No left MCA branch occlusion or focal stenosis identified. Artifactual signal loss in the proximal basilar artery.     2D Echocardiogram  EF 60-65%. No thrombus. Possible small PFO by color doppler. Moderate pulmonary hypertension  TEE    Carotid Doppler  No evidence of hemodynamically significant internal carotid artery stenosis. Vertebral artery flow is antegrade.   CXR    EKG  normal sinus rhythm, PAC's noted.   Therapy Recommendations No PT, OP OT  Physical Exam   Present middle-aged [place] lady currently not in distress but appears frustrated due to speech difficulties.Awake alert. Afebrile. Head is nontraumatic. Neck is supple without bruit. Hearing is normal. Cardiac exam no murmur or gallop. Lungs are clear to auscultation. Distal pulses are well felt Neurological Exam : . Awake alert oriented x3. Mild expressive aphasia with word finding difficulties and nonfluent speech. No paraphasic errors. Good comprehension. Mild difficulty with naming and repetition. Extraocular movements are full range without nystagmus. Blinks to threat bilaterally. Mild right lower facial weakness. Tongue is midline. Motor system exam no upper or lower eczema to drift. Slightly diminished fine finger movements on the right. Orbits left-to-right upper extremity. Symmetric lower extremity strength without drift. Sensation is intact. Coordination is slow but I correct. Gait was not tested. ASSESSMENT Rebecca [name] is a 68 y.o. female presenting with aphasia. 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 Infarct felt to be  embolic secondary to unknown source.  On no antithrombotics prior  to admission. Now on aspirin 81 mg orally every day and clopidogrel 75 mg orally every day for secondary stroke prevention. Patient with resultant expressive aphasia. Work up underway.  hypertension Diabetes, HgbA1c 6.2, goal < 7.0 Hyperlipidemia, LDL 104, on no statin PTA, now on lipitor 40, goal LDL < 70 for diabetics  Hospital day # 2  TREATMENT/PLAN  Continue clopidogrel 75 mg orally every day alone for secondary stroke prevention. No indication for dual antiplatelets in this patient (may also use aspirin 325 mg alone as pt was naive prior to admission). Dual antiplatelets increases risk for intracerebral hemorrhage. TEE to look for embolic source. Arranged with [facility] for tomorrow.  If positive for PFO (patent foramen ovale), check bilateral  lower extremity venous dopplers to rule out DVT as possible source of stroke. (I have made patient NPO after midnight tonight). If TEE negative, a [facility] electrophysiologist will consult and place implantable loop recorder to evaluate for atrial fibrillation as etiology of stroke. This has been explained to patient/family by Dr. Pearlean Brownie and they are agreeable.   Annie Main, MSN, RN, ANVP-BC, ANP-BC, [facility] Pager: [phone] 03/25/2013 12:15 PM  I have personally obtained a history, examined the patient, evaluated imaging results, and formulated the assessment and plan of care. I agree with the above. Delia Heady, MD

## 2013-03-25 NOTE — Progress Notes (Signed)
Family Medicine Teaching Service Daily Progress Note Intern Pager: [phone]  Patient name: Rebecca [name] Medical record number: [id number] Date of birth: 06/20/1945 Age: 68 y.o. Gender: female  Primary Care Provider: Burtis JunesBLOUNT,ALVIN VINCENT, MD Consultants: Neurology  Code Status: Full   Pt Overview and Major Events to Date:   Assessment and Plan: Rebecca CoeConstance Balducci is a 68 y.o. female presenting with acute onset expressive aphasia. Found to have acute anterior left MCA territory infarct. Source is undetermined but will attain TEE, if negative will have loop recoder. PMH is significant for HTN, DM, ?HLD - pt cannot provide home medication regimen and son is unsure of what medications she takes.   # Acute anterior left MCA territory infarct.  - Atorvastatin 40 mg QD  - 2D echo: 60-65% EF, Pulmonary HTN, Grade 2 diastolic dysfunction  - Carotid Doppler: 1-39%  - ASA 81mg   - Plavix   - PT - no PT follow up.  - OT/SLP evals  - Neuro rec's: TEE and loop recorder.   # Elevated Troponin : POCT troponin slightly elevated 0.13  - two negative serum troponins in the ED and EKG / history not suggestive of ACS  - trop (-) x 2.   # Diabetes:  - SSI moderate until home regimen determined  - HgbA1c 6.2  #HTN: unknown what home medication regimen is, son to bring in medications  - metoprolol 25 mg held  - start lisinopril 2.5 and HCTZ 12.5 - monitor BP, permissive HTN given stroke, as above   #?HLD: started on statin as above.   FEN/GI: 1/2NS at 100 mL/h Prophylaxis: SCD's for DVT prophylaxis with question of stroke  Disposition: pending improvement   Subjective: Speaking seems improved this morning. She is able to put a sentence together and is able to read the paper. She still endorses a break and cannot have a full conversation.    Objective: Temp:  [97.5 F (36.4 C)-98.1 F (36.7 C)] 97.8 F (36.6 C) (01/06 0538) Pulse Rate:  [59-64] 62 (01/06 0538) Resp:  [18-20] 18 (01/06  0538) BP: (158-178)/(90-99) 178/98 mmHg (01/06 0538) SpO2:  [96 %-100 %] 100 % (01/06 0538) Physical Exam: General: elderly adult female, alert, cooperative in NAD, sitting on side of bed.  Cardiovascular: RRR, normal heart sounds, no murmurs appreciated  Respiratory: CTAB, no wheezes or crackles.  Extremities: warm and well perfused.  Skin: warm and dry  Neuro: A+O. Expressive aphasia presentbut improved compared to yesterday. Follows commands. ,   Laboratory:  Recent Labs Lab 03/23/13 1440 03/23/13 1501  WBC 4.0  --   HGB 13.9 15.6*  HCT 41.0 46.0  PLT 187  --     Recent Labs Lab 03/23/13 1440 03/23/13 1501  NA 144 145  K 3.9 3.8  CL 104 106  CO2 25  --   BUN 18 19  CREATININE 0.89 1.00  CALCIUM 9.3  --   PROT 8.1  --   BILITOT 0.6  --   ALKPHOS 37*  --   ALT 13  --   AST 28  --   GLUCOSE 103* 107*    Recent Labs Lab 03/24/13 0701 03/24/13 1209 03/24/13 1619 03/24/13 2103 03/25/13 0700  GLUCAP 88 90 93 85 89    Urine cx: multiple bacterial morphotype's present.   HgbA1c: 6.2  Imaging/Diagnostic Tests: EXAM:  MRI HEAD WITHOUT CONTRAST  MRA HEAD WITHOUT CONTRAST IMPRESSION:  1. Acute anterior left MCA territory infarct. No mass effect or  hemorrhage.  2. Advanced underlying  chronic small and medium-sized vessel  ischemia throughout the brain.  3. Generalized intracranial artery dolichoectasia. No left MCA  branch occlusion or focal stenosis identified. Artifactual signal  loss in the proximal basilar artery   Myra Rude, MD 03/25/2013, 7:11 AM PGY-1, [facility] Family Medicine FPTS Intern pager: [phone], text pages welcome

## 2013-03-25 NOTE — Progress Notes (Signed)
FMTS Attending  Note: Kana Reimann,MD I  have seen and examined this patient, reviewed their chart. I have discussed this patient with the resident. I agree with the resident's findings, assessment and care plan.  

## 2013-03-25 NOTE — Evaluation (Signed)
Speech Language Pathology Evaluation Patient Details Name: Rebecca [name] MRN: [id number] DOB: 03/23/1945 Today's Date: 03/25/2013 Time: 5643-3295 SLP Time Calculation (min): 47 min  Problem List:  Patient Active Problem List   Diagnosis Date Noted  . Expressive aphasia 03/23/2013  . Diabetes mellitus 03/23/2013  . Hypertension 03/23/2013  . Decreased calculated GFR 03/23/2013   Past Medical History:  Past Medical History  Diagnosis Date  . Hypertension    Past Surgical History: History reviewed. No pertinent past surgical history. HPI:  Rebecca [name] is a 68 y.o. female presenting with acute onset expressive aphasia. 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 PMH is significant for HTN, DM, ?HLD - pt cannot provide home medication regimen and son is unsure of what medications she takes.   Assessment / Plan / Recommendation Clinical Impression  Pt. exhibits a moderate expressive aphasia that is most noticable during conversational speech.  Pt. is able to answer basic questions and her speech is clear, but her language breaks down when complexity increases (such as in connceted conversational speech).  Pt. describes this as "gaps" stating, "I start out fine, and then it just stops."  Receptive language, reading aloud, and reading comprehension all appear intact.  Pt. becomes frustrated, and is frequently unable to complete her thought in conversation.  Pt. assisted her son, who does not drive, but lives with her.  She has been responsible for getting him to dialysis. Pt. has another son that also lives in [place].     SLP Assessment  Patient needs continued Speech Lanaguage Pathology Services    Follow Up Recommendations  Home health SLP;24 hour supervision/assistance    Frequency and Duration min 2x/week  2 weeks   Pertinent Vitals/Pain n/a   SLP Goals  SLP Goals Potential to Achieve Goals: Good SLP Goal #1: Pt. will use complete sentences to describe pictures  with min cues. SLP Goal #2: Pt. will utilize compensatory strategies to complete her thought in conversational speech.with min cues. and only moderate delays.  SLP Evaluation Prior Functioning  Cognitive/Linguistic Baseline: Within functional limits Type of Home: House  Lives With: Son (Who requires her assistance (on HD)) Available Help at Discharge: Friend(s) Education: 11th grade Vocation: Other (comment)   Cognition  Overall Cognitive Status: Impaired/Different from baseline Orientation Level: Oriented X4 Attention: Divided Divided Attention: Impaired Divided Attention Impairment: Verbal basic;Functional basic Memory: Impaired Memory Impairment: Decreased recall of new information Awareness: Appears intact Problem Solving: Appears intact Executive Function: Organizing;Decision Making;Self Correcting Organizing: Impaired Organizing Impairment: Verbal complex;Functional complex Decision Making: Impaired Decision Making Impairment: Verbal complex;Functional complex Self Correcting: Impaired Self Correcting Impairment: Verbal complex;Functional complex Safety/Judgment: Appears intact    Comprehension  Auditory Comprehension Overall Auditory Comprehension: Appears within functional limits for tasks assessed Reading Comprehension Reading Status: Within funtional limits    Expression Expression Primary Mode of Expression: Verbal Verbal Expression Overall Verbal Expression: Impaired Initiation: Impaired Level of Generative/Spontaneous Verbalization: Sentence Repetition: No impairment Naming: No impairment Pragmatics: Impairment Impairments: Abnormal affect;Eye contact Effective Techniques: Semantic cues;Phonemic cues;Sentence completion Written Expression Dominant Hand: Right Written Expression: Not tested   Oral / Motor Oral Motor/Sensory Function Overall Oral Motor/Sensory Function: Appears within functional limits for tasks assessed Motor Speech Overall Motor  Speech: Appears within functional limits for tasks assessed Respiration: Within functional limits Phonation: Normal Resonance: Within functional limits Articulation: Within functional limitis Intelligibility: Intelligible Motor Planning: Witnin functional limits   GO     Rebecca [name] 03/25/2013, 5:11 PM

## 2013-03-25 NOTE — Evaluation (Signed)
Occupational Therapy Evaluation Patient Details Name: Rebecca [name]: [id number] DOB: 04/13/1945 Today's Date: 03/25/2013 Time: [id number] OT Time Calculation (min): 29 min  OT Assessment / Plan / Recommendation History of present illness Rebecca [name] is a 68 y.o. female presenting with acute onset expressive aphasia. Leading Dx is left-MCA-territory CVA    Clinical Impression   Pt presents with below problem list. Pt will benefit from acute OT to increase independence prior to d/c.     OT Assessment  Patient needs continued OT Services    Follow Up Recommendations  Outpatient OT;Supervision/Assistance - 24 hour    Barriers to Discharge      Equipment Recommendations  Other (comment) (tbd)    Recommendations for Other Services    Frequency  Min 2X/week    Precautions / Restrictions Precautions Precautions: Fall Restrictions Weight Bearing Restrictions: No   Pertinent Vitals/Pain Pain in left hand (IV site). Nurse notified.     ADL  Grooming: Min guard;Wash/dry hands;Teeth care;Denture care Where Assessed - Grooming: Supported standing Lower Body Dressing: Min guard Where Assessed - Lower Body Dressing: Unsupported sit to stand Toilet Transfer: Supervision/safety Toilet Transfer Method: Sit to [place] Toilet Transfer Equipment: Other (comment);Bedside commode (bed) Toileting - Clothing Manipulation and Hygiene: Min guard Where Assessed - Toileting Clothing Manipulation and Hygiene: Sit to stand from 3-in-1 or toilet Tub/Shower Transfer Method: Not assessed Equipment Used: Gait belt Transfers/Ambulation Related to ADLs: Min guard ADL Comments: Pt out of breath during session. Educated on energy conservation. Recommended sitting to bathe and sitting for dressing.  Pt insisting on performing her routine in order. Pt became upset when standing at sink saying she has family in [place] and has children with health issues and no mother or father.  Pt stood at sink  and performed teeth/denture care as well as washed hands. Pt standing at sink and leaning over (appeared tired).    OT Diagnosis: Generalized weakness;Altered mental status  OT Problem List: Decreased strength;Impaired balance (sitting and/or standing);Decreased activity tolerance;Decreased knowledge of use of DME or AE;Decreased knowledge of precautions;Pain;Decreased cognition OT Treatment Interventions: Self-care/ADL training;Therapeutic exercise;DME and/or AE instruction;Therapeutic activities;Cognitive remediation/compensation;Visual/perceptual remediation/compensation;Patient/family education;Balance training   OT Goals(Current goals can be found in the care plan section) Acute Rehab OT Goals Patient Stated Goal: not stated OT Goal Formulation: With patient Time For Goal Achievement: 04/01/13 Potential to Achieve Goals: Good ADL Goals Pt Will Perform Lower Body Dressing: with modified independence;sit to/from stand Pt Will Transfer to Toilet: with modified independence;ambulating;regular height toilet Pt Will Perform Toileting - Clothing Manipulation and hygiene: with modified independence;sit to/from stand Pt Will Perform Tub/Shower Transfer: Shower transfer;with supervision;ambulating (shower equipment tbd) Additional ADL Goal #1: Pt will participate in further vision assessment.  Visit Information  Last OT Received On: 03/25/13 Assistance Needed: +1 History of Present Illness: Rebecca [name] is a 68 y.o. female presenting with acute onset expressive aphasia. Leading Dx is left-MCA-territory CVA        Prior Functioning     Home Living Family/patient expects to be discharged to:: Private residence Living Arrangements: Children Available Help at Discharge: Friend(s) Type of Home: House Home Access: Stairs to enter Entergy CorporationEntrance Stairs-Number of Steps: 14 Entrance Stairs-Rails: Right;Left Home Layout: Two level Alternate Level Stairs-Number of Steps: 14 Alternate Level  Stairs-Rails: Can reach both Home Equipment: Walker - 2 wheels Additional Comments: unsure of accuracy Prior Function Level of Independence: Independent Communication Communication: Expressive difficulties Dominant Hand: Right         Vision/Perception Vision - History  Baseline Vision: Wears glasses all the time Vision - Assessment Vision Assessment: Vision tested Visual Fields:  (inconsistent)   Cognition  Cognition Arousal/Alertness: Awake/alert Behavior During Therapy: Flat affect Overall Cognitive Status: No family/caregiver present to determine baseline cognitive functioning Area of Impairment: Problem solving;Following commands Following Commands: Follows one step commands with increased time Problem Solving: Slow processing    Extremity/Trunk Assessment Upper Extremity Assessment Upper Extremity Assessment: Generalized weakness Lower Extremity Assessment Lower Extremity Assessment: Defer to PT evaluation     Mobility Bed Mobility Bed Mobility: Supine to Sit;Sit to Supine Supine to Sit: 5: Supervision Sitting - Scoot to Edge of Bed: 5: Supervision Sit to Supine: 5: Supervision Details for Bed Mobility Assistance: Pt taking increased time. Supervision for safety. Transfers Transfers: Sit to Stand;Stand to Sit Sit to Stand: 5: Supervision;From bed;From chair/3-in-1 Stand to Sit: 5: Supervision;To bed;To chair/3-in-1 Details for Transfer Assistance: Supervision for safety. Cues for hand placement.     Exercise     Balance     End of Session OT - End of Session Equipment Utilized During Treatment: Gait belt Activity Tolerance: Patient tolerated treatment well Patient left: in bed;with call bell/phone within reach;with bed alarm set Nurse Communication: Other (comment) (cognition)  GO     Earlie Raveling OTR/L [phone] 03/25/2013, 2:31 PM

## 2013-03-25 NOTE — Evaluation (Signed)
Physical Therapy Evaluation Patient Details Name: Erini Sarkissian MRN: [id number] DOB: [birth year] Today's Date: 03/25/2013 Time: 9509-3267 PT Time Calculation (min): 25 min  PT Assessment / Plan / Recommendation History of Present Illness  Demica Sugai is a 68 y.o. female presenting with acute onset expressive aphasia. Leading Dx is left-MCA-territory CVA   Clinical Impression  Patient demonstrates significant deficits related to cognition and safety at this time as indicated below. Feel patient is a high safety risk due to no awareness of deficits present. At this time recommend 24/7 supervision upon discharge. Will continue to see and progress activity with education and safety focus.     PT Assessment  Patient needs continued PT services    Follow Up Recommendations  No PT follow up;Supervision/Assistance - 24 hour    Does the patient have the potential to tolerate intense rehabilitation      Barriers to Discharge Other (comment) unsure of home set up, patient unsafe to be without supervision secondary to concerning cognition deficts    Equipment Recommendations  None recommended by PT    Recommendations for Other Services Speech consult   Frequency Min 3X/week    Precautions / Restrictions Precautions Precautions:  (Safety concern secondary to aphasia) Restrictions Weight Bearing Restrictions: No   Pertinent Vitals/Pain No pain at this time, VSS      Mobility  Bed Mobility Bed Mobility: Supine to Sit;Sitting - Scoot to [place] of Bed;Sit to Supine Supine to Sit: 4: Min assist Sitting - Scoot to [place] of Bed: 4: Min assist Sit to Supine: 4: Min assist Details for Bed Mobility Assistance: increased time to perform, unable to follow command to sit EOB despite 4 attempts ( needed manual and visual cues to come to EOB (patient remained long sitting in bed not recognizing that she was not following command to sit EOB) Transfers Transfers: Sit to Stand;Stand to Sit Sit  to Stand: 5: Supervision Stand to Sit: 5: Supervision Details for Transfer Assistance: No physical assist required. Ambulation/Gait Ambulation/Gait Assistance: 4: Min assist Ambulation Distance (Feet): 440 Feet Assistive device: None Ambulation/Gait Assistance Details: assist for direction, assist to avoid objects (chairs, people) Unable to follow direction cues consistently Gait Pattern: Step-through pattern;Decreased stride length;Narrow base of support Gait velocity: WFL General Gait Details: patient fixated on touching every chair in path despite cues to avoid chairs, unable to follow directional cues, no recognition of deficits, stating "i thought I did everything you said"    Exercises     PT Diagnosis: Difficulty walking;Altered mental status  PT Problem List: Decreased activity tolerance;Decreased cognition;Decreased safety awareness PT Treatment Interventions: Gait training;Stair training;Functional mobility training;Therapeutic activities;Therapeutic exercise;Cognitive remediation;Patient/family education     PT Goals(Current goals can be found in the care plan section) Acute Rehab PT Goals PT Goal Formulation: With patient Time For Goal Achievement: 04/08/13 Potential to Achieve Goals: Fair  Visit Information  Last PT Received On: 03/25/13 Assistance Needed: +1 History of Present Illness: Kimana Week is a 68 y.o. female presenting with acute onset expressive aphasia. Leading Dx is left-MCA-territory CVA        Prior Functioning  Home Living Family/patient expects to be discharged to:: Private residence Available Help at Discharge: Friend(s) Type of Home: House Home Access: Stairs to enter [employer] of Steps: 14 Entrance Stairs-Rails: Right;Left Home Layout: Two level Alternate Level Stairs-Number of Steps: 14 Alternate Level Stairs-Rails: Can reach both Home Equipment: Walker - 2 wheels Additional Comments: question accuarcy of information  provided, significant prompting required secondary to aphasia  as when asked if she had family or children she said she did not, per chart, communication has been with son therefor patient clearly has children. Prior Function Level of Independence: Independent Dominant Hand: Right    Cognition  Cognition Arousal/Alertness: Awake/alert Behavior During Therapy: Flat affect Overall Cognitive Status: Impaired/Different from baseline Area of Impairment: Attention;Following commands;Safety/judgement;Awareness;Problem solving Current Attention Level: Focused;Sustained Following Commands: Follows one step commands inconsistently;Follows one step commands with increased time;Follows multi-step commands inconsistently Safety/Judgement: Decreased awareness of deficits Awareness: Emergent Problem Solving: Slow processing;Difficulty sequencing;Requires verbal cues;Requires tactile cues General Comments: patient unable to follow directional commands, unable to follow task oriented commands properly, difficulty word finding, poor awareness of environment and when discuessed patient states that she felt she did not make mistakes when attempting to follow commands. (patient cued to go a specific direction on 8 occassions, only able to correctly follow command x2 out of 8. Patient instructed to perform particular tasks, but could not carry out tasks directed.     Extremity/Trunk Assessment Upper Extremity Assessment Upper Extremity Assessment: Defer to OT evaluation Lower Extremity Assessment Lower Extremity Assessment: Generalized weakness   Balance    End of Session PT - End of Session Equipment Utilized During Treatment: Gait belt Activity Tolerance: Patient tolerated treatment well Patient left: in bed;with call bell/phone within reach;with bed alarm set Nurse Communication: Mobility status  GP     Fabio AsaWerner, Jemarion Roycroft J 03/25/2013, 10:43 AM  Charlotte Crumbevon Ondine Gemme, PT DPT  [phone]

## 2013-03-26 ENCOUNTER — Encounter (HOSPITAL_COMMUNITY)
Admission: EM | Disposition: A | Discharge status: Home / Self Care | Payer: Self-pay | Source: Home / Self Care | Attending: Family Medicine

## 2013-03-26 ENCOUNTER — Encounter (HOSPITAL_COMMUNITY): Payer: Self-pay

## 2013-03-26 ENCOUNTER — Inpatient Hospital Stay (HOSPITAL_COMMUNITY): Payer: Medicare Other

## 2013-03-26 DIAGNOSIS — I421 Obstructive hypertrophic cardiomyopathy: Secondary | ICD-10-CM

## 2013-03-26 DIAGNOSIS — I359 Nonrheumatic aortic valve disorder, unspecified: Secondary | ICD-10-CM

## 2013-03-26 HISTORY — PX: TEE WITHOUT CARDIOVERSION: SHX5443

## 2013-03-26 LAB — CBC
HEMATOCRIT: 39.7 % (ref 36.0–46.0)
Hemoglobin: 13.3 g/dL (ref 12.0–15.0)
MCH: 28.1 pg (ref 26.0–34.0)
MCHC: 33.5 g/dL (ref 30.0–36.0)
MCV: 83.8 fL (ref 78.0–100.0)
Platelets: 189 10*3/uL (ref 150–400)
RBC: 4.74 MIL/uL (ref 3.87–5.11)
RDW: 14.6 % (ref 11.5–15.5)
WBC: 4.2 10*3/uL (ref 4.0–10.5)

## 2013-03-26 LAB — BASIC METABOLIC PANEL
BUN: 14 mg/dL (ref 6–23)
CO2: 26 mEq/L (ref 19–32)
CREATININE: 0.93 mg/dL (ref 0.50–1.10)
Calcium: 8.9 mg/dL (ref 8.4–10.5)
Chloride: 105 mEq/L (ref 96–112)
GFR calc non Af Amer: 62 mL/min — ABNORMAL LOW (ref >90)
GFR, EST AFRICAN AMERICAN: 72 mL/min — AB (ref >90)
Glucose, Bld: 82 mg/dL (ref 70–99)
POTASSIUM: 4.1 meq/L (ref 3.7–5.3)
Sodium: 145 mEq/L (ref 137–147)

## 2013-03-26 LAB — GLUCOSE, CAPILLARY
GLUCOSE-CAPILLARY: 73 mg/dL (ref 70–99)
Glucose-Capillary: 73 mg/dL (ref 70–99)
Glucose-Capillary: 77 mg/dL (ref 70–99)
Glucose-Capillary: 76 mg/dL (ref 70–99)
Glucose-Capillary: 74 mg/dL (ref 70–99)

## 2013-03-26 SURGERY — LOOP RECORDER IMPLANT
Anesthesia: LOCAL

## 2013-03-26 SURGERY — ECHOCARDIOGRAM, TRANSESOPHAGEAL
Anesthesia: Moderate Sedation

## 2013-03-26 MED ORDER — FENTANYL CITRATE 0.05 MG/ML IJ SOLN
INTRAMUSCULAR | Status: AC
  Filled 2013-03-26: qty 2

## 2013-03-26 MED ORDER — LIDOCAINE VISCOUS 2 % MT SOLN
OROMUCOSAL | Status: AC
  Filled 2013-03-26: qty 15

## 2013-03-26 MED ORDER — MIDAZOLAM HCL 10 MG/2ML IJ SOLN
INTRAMUSCULAR | Status: DC | PRN
  Administered 2013-03-26: 2 mg via INTRAVENOUS
  Administered 2013-03-26: 1 mg via INTRAVENOUS

## 2013-03-26 MED ORDER — LISINOPRIL 5 MG PO TABS
5.0000 mg | ORAL_TABLET | Freq: Every day | ORAL | Status: DC
  Administered 2013-03-27: 5 mg via ORAL
  Filled 2013-03-26: qty 1

## 2013-03-26 MED ORDER — HYDROCHLOROTHIAZIDE 12.5 MG PO CAPS
12.5000 mg | ORAL_CAPSULE | Freq: Once | ORAL | Status: AC
  Administered 2013-03-26: 12.5 mg via ORAL
  Filled 2013-03-26 (×2): qty 1

## 2013-03-26 MED ORDER — LISINOPRIL 2.5 MG PO TABS
2.5000 mg | ORAL_TABLET | Freq: Once | ORAL | Status: AC
  Administered 2013-03-26: 2.5 mg via ORAL
  Filled 2013-03-26 (×2): qty 1

## 2013-03-26 MED ORDER — MIDAZOLAM HCL 5 MG/ML IJ SOLN
INTRAMUSCULAR | Status: AC
  Filled 2013-03-26: qty 2

## 2013-03-26 MED ORDER — BUTAMBEN-TETRACAINE-BENZOCAINE 2-2-14 % EX AERO
INHALATION_SPRAY | CUTANEOUS | Status: DC | PRN
  Administered 2013-03-26: 2 via TOPICAL

## 2013-03-26 MED ORDER — SODIUM CHLORIDE 0.9 % IV SOLN
INTRAVENOUS | Status: DC
  Administered 2013-03-26: 19:00:00 via INTRAVENOUS

## 2013-03-26 MED ORDER — GADOBENATE DIMEGLUMINE 529 MG/ML IV SOLN
24.0000 mL | Freq: Once | INTRAVENOUS | Status: AC
  Administered 2013-03-26: 24 mL via INTRAVENOUS

## 2013-03-26 MED ORDER — HYDROCHLOROTHIAZIDE 25 MG PO TABS
25.0000 mg | ORAL_TABLET | Freq: Every day | ORAL | Status: DC
  Administered 2013-03-27: 25 mg via ORAL
  Filled 2013-03-26: qty 1

## 2013-03-26 MED ORDER — FENTANYL CITRATE 0.05 MG/ML IJ SOLN
INTRAMUSCULAR | Status: DC | PRN
  Administered 2013-03-26: 25 ug via INTRAVENOUS

## 2013-03-26 MED ORDER — GLUCOSE-VITAMIN C 4-6 GM-MG PO CHEW
1.0000 | CHEWABLE_TABLET | Freq: Once | ORAL | Status: AC
  Administered 2013-03-26: 1 via ORAL

## 2013-03-26 MED ORDER — GLUCOSE-VITAMIN C 4-6 GM-MG PO CHEW
CHEWABLE_TABLET | ORAL | Status: AC
  Filled 2013-03-26: qty 1

## 2013-03-26 NOTE — Progress Notes (Signed)
Talked to patient about home health care choices, patient chose [facility]; Olegario Messier with [facility] called for arrangements; Abelino Derrick RN,BSN,MHA [phone]

## 2013-03-26 NOTE — Progress Notes (Signed)
Family Medicine Teaching Service Daily Progress Note Intern Pager: [phone]  Patient name: Rebecca CoeConstance [name] Medical record number: [id number] Date of birth: 05/07/1945 Age: 68 y.o. Gender: female  Primary Care Provider: Burtis JunesBLOUNT,ALVIN VINCENT, MD Consultants: Neurology  Code Status: Full   Pt Overview and Major Events to Date:  1/7: TEE today  Assessment and Plan: Rebecca [name] is a 68 y.o. female presenting with acute onset expressive aphasia. Found to have acute anterior left MCA territory infarct. Source is undetermined but will attain TEE, if negative will have loop recoder. PMH is significant for HTN, DM, ?HLD - pt cannot provide home medication regimen and son is unsure of what medications she takes.   # Acute anterior left MCA territory infarct.  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   # Diabetes:  - SSI moderate until home regimen determined  - HgbA1c 6.2  #HTN: unknown what home medication regimen is, son to bring in medications  - metoprolol 25 mg held  - start lisinopril 2.5 and HCTZ 12.5 - monitor BP, permissive HTN given stroke, as above   #?HLD: started on statin as above.   FEN/GI: 1/2NS at 100 mL/h Prophylaxis: SCD's for DVT prophylaxis with question of stroke  Disposition: pending improvement   Subjective: She is able to put a sentence together and is able to read the paper. She still endorses a break and cannot have a full conversation, which is unchanged from yesterday.      Objective: Temp:  [97.6 F (36.4  C)-98.5 F (36.9 C)] 98.4 F (36.9 C) (01/07 0546) Pulse Rate:  [54-71] 64 (01/07 0546) Resp:  [18] 18 (01/07 0546) BP: (142-164)/(85-104) 162/94 mmHg (01/07 0546) SpO2:  [97 %-100 %] 100 % (01/07 0546) Physical Exam: General: elderly adult female, alert, cooperative in NAD, sitting on side of bed.  Cardiovascular: RRR, normal heart sounds, no murmurs appreciated  Respiratory: CTAB, no wheezes or crackles.  Extremities: warm and well perfused.  Skin: warm and dry  Neuro: A+O. Expressive aphasia present. Follows commands. ,   Laboratory:  Recent Labs Lab 03/23/13 1440 03/23/13 1501  WBC 4.0  --   HGB 13.9 15.6*  HCT 41.0 46.0  PLT 187  --     Recent Labs Lab 03/23/13 1440 03/23/13 1501  NA 144 145  K 3.9 3.8  CL 104 106  CO2 25  --   BUN 18 19  CREATININE 0.89 1.00  CALCIUM 9.3  --   PROT 8.1  --   BILITOT 0.6  --   ALKPHOS 37*  --   ALT 13  --   AST 28  --   GLUCOSE 103* 107*    Recent Labs Lab 03/25/13 0836 03/25/13 1126 03/25/13 1621 03/25/13 2115 03/26/13 0645  GLUCAP 88 95 100* 87 73    Urine cx: multiple bacterial morphotype's present.   HgbA1c: 6.2  Imaging/Diagnostic Tests: EXAM:  MRI HEAD WITHOUT CONTRAST  MRA HEAD WITHOUT CONTRAST IMPRESSION:  1. Acute anterior left MCA territory  infarct. No mass effect or  hemorrhage.  2. Advanced underlying chronic small and medium-sized vessel  ischemia throughout the brain.  3. Generalized intracranial artery dolichoectasia. No left MCA  branch occlusion or focal stenosis identified. Artifactual signal  loss in the proximal basilar artery   Wenda Low, MD 03/26/2013, 8:53 AM PGY-1, [facility] Family Medicine FPTS Intern pager: [phone], text pages welcome

## 2013-03-26 NOTE — Progress Notes (Signed)
FMTS Attending  Note: Rebecca Aguon,MD I  have seen and examined this patient, reviewed their chart. I have discussed this patient with the resident. I agree with the resident's findings, assessment and care plan.  

## 2013-03-26 NOTE — Progress Notes (Signed)
  Echocardiogram Echocardiogram Transesophageal has been performed.  Rebecca [name] 03/26/2013, 4:14 PM

## 2013-03-26 NOTE — Progress Notes (Signed)
Occupational Therapy Treatment Patient Details Name: Rebecca [name] MRN: [id number] DOB: 1945-11-03 Today's Date: 03/26/2013 Time: 5929-2446 OT Time Calculation (min): 9 min  OT Assessment / Plan / Recommendation  History of present illness Rebecca [name] is a 68 y.o. female presenting with acute onset expressive aphasia. Leading Dx is left-MCA-territory CVA    OT comments  Pt very reluctant to engage in visual assessment. Therapist using functional reading task to help assess. Pt able to read news paper in all 4 quadrants of vision and various font sizes. Testing was not formal due to participation and patient reports regular checkups as the reason for not needing testing by therapist.  Follow Up Recommendations  Outpatient OT;Supervision/Assistance - 24 hour    Barriers to Discharge       Equipment Recommendations  Other (comment)    Recommendations for Other Services    Frequency Min 2X/week   Progress towards OT Goals Progress towards OT goals: Progressing toward goals  Plan Discharge plan remains appropriate    Precautions / Restrictions Precautions Precautions: Fall   Pertinent Vitals/Pain None reported    ADL  ADL Comments: Pt seens for visual assessment. pt reports "prisms in her glasses for Fabrys disease". Pt noted to have thicker lense but does not appear to be a "prism". Pt don glasses and was able to read all 4 quadrants of visual fields. Pt read print ranging from > 40 font to 10 font. Pt very luctant to engage in therapy at this time. Session stopped with MD Pearlean Brownie arrival to discuss pending treatment with patient.     OT Diagnosis:    OT Problem List:   OT Treatment Interventions:     OT Goals(current goals can now be found in the care plan section) Acute Rehab OT Goals Patient Stated Goal: not stated OT Goal Formulation: With patient Time For Goal Achievement: 04/01/13 Potential to Achieve Goals: Good ADL Goals Pt Will Perform Lower Body Dressing:  with modified independence;sit to/from stand Pt Will Transfer to Toilet: with modified independence;ambulating;regular height toilet Pt Will Perform Toileting - Clothing Manipulation and hygiene: with modified independence;sit to/from stand Pt Will Perform Tub/Shower Transfer: Shower transfer;with supervision;ambulating Additional ADL Goal #1: Pt will participate in further vision assessment.  Visit Information  Last OT Received On: 03/26/13 Assistance Needed: +1 History of Present Illness: Rebecca [name] is a 68 y.o. female presenting with acute onset expressive aphasia. Leading Dx is left-MCA-territory CVA     Subjective Data      Prior Functioning       Cognition  Cognition Arousal/Alertness: Awake/alert Behavior During Therapy: Flat affect General Comments: Pt able to verbalize Fabrys disease and reason for glasses. Pt very flat affect and closing eyes at time to avoid therapist    Mobility       Exercises      Balance    End of Session OT - End of Session Activity Tolerance: Patient tolerated treatment well Patient left: in bed  GO     Harolyn Rutherford 03/26/2013, 1:29 PM Pager: [phone]

## 2013-03-26 NOTE — Progress Notes (Signed)
Stroke Team Progress Note  HISTORY Amado CoeConstance Nippert is an 68 y.o. female, right handed, with a past medical history significant for HTN, DM, brought to [facility] ED 03/23/2013 by family for further evaluation of confusion and language disturbance. She denies having similar symptoms before. Last known well is uncertain, but around 1300 today she was at a local store and observed to be in a confused state and having difficulty expressing herself.  ED physician was unable to locate family member that lives with patient in order to establish her last known well.  Upon arrival to the ED she was described as having expressive aphasia. Son is at the bedside and said that " my mother never had a problem like this, she is slow and not able to get things together as she used to".  Denies HA, vertigo, double vision, focal weakness or numbness, unsteadiness, slurred speech, visual disturbances, or trouble with comprehension of language.  CT brain pending.   Patient was not a TPA candidate secondary to unknown time last known well. She was admitted for further evaluation and treatment.  SUBJECTIVE No family at bedside.  OBJECTIVE Most recent Vital Signs: Filed Vitals:   03/25/13 2144 03/26/13 0112 03/26/13 0546 03/26/13 1039  BP: 161/99 164/103 162/94 169/105  Pulse: 54 60 64 67  Temp: 98.5 F (36.9 C) 98.1 F (36.7 C) 98.4 F (36.9 C) 98 F (36.7 C)  TempSrc: Oral Oral Oral Oral  Resp: 18 18 18 18   Height:      Weight:      SpO2: 100% 98% 100% 100%   CBG (last 3)   Recent Labs  03/25/13 2115 03/26/13 0645 03/26/13 1211  GLUCAP 87 73 77    IV Fluid Intake:   . sodium chloride 1,000 mL (03/26/13 0546)    MEDICATIONS  . atorvastatin  40 mg Oral q1800  . clopidogrel  75 mg Oral Q breakfast  . hydrochlorothiazide  12.5 mg Oral Daily  . insulin aspart  0-15 Units Subcutaneous TID WC  . lisinopril  2.5 mg Oral Daily   PRN:  acetaminophen, acetaminophen  Diet:  NPO  Activity:  OOB with  assistance DVT Prophylaxis:  SCDs   CLINICALLY SIGNIFICANT STUDIES Basic Metabolic Panel:   Recent Labs Lab 03/23/13 1440 03/23/13 1501 03/26/13 1020  NA 144 145 145  K 3.9 3.8 4.1  CL 104 106 105  CO2 25  --  26  GLUCOSE 103* 107* 82  BUN 18 19 14   CREATININE 0.89 1.00 0.93  CALCIUM 9.3  --  8.9   Liver Function Tests:   Recent Labs Lab 03/23/13 1440  AST 28  ALT 13  ALKPHOS 37*  BILITOT 0.6  PROT 8.1  ALBUMIN 3.8   CBC:   Recent Labs Lab 03/23/13 1440 03/23/13 1501 03/26/13 1020  WBC 4.0  --  4.2  NEUTROABS 2.0  --   --   HGB 13.9 15.6* 13.3  HCT 41.0 46.0 39.7  MCV 84.9  --  83.8  PLT 187  --  189   Coagulation:   Recent Labs Lab 03/23/13 1440  LABPROT 13.2  INR 1.02   Cardiac Enzymes:   Recent Labs Lab 03/23/13 1440 03/23/13 1514  TROPONINI <0.30 <0.30   Urinalysis:   Recent Labs Lab 03/23/13 1520  COLORURINE YELLOW  LABSPEC 1.019  PHURINE 5.5  GLUCOSEU NEGATIVE  HGBUR NEGATIVE  BILIRUBINUR NEGATIVE  KETONESUR 15*  PROTEINUR 100*  UROBILINOGEN 0.2  NITRITE NEGATIVE  LEUKOCYTESUR NEGATIVE   Lipid  Panel    Component Value Date/Time   CHOL 202* 03/24/2013 0350   TRIG 66 03/24/2013 0350   HDL 85 03/24/2013 0350   CHOLHDL 2.4 03/24/2013 0350   VLDL 13 03/24/2013 0350   LDLCALC 104* 03/24/2013 0350   HgbA1C  Lab Results  Component Value Date   HGBA1C 6.2* 03/24/2013    Urine Drug Screen:     Component Value Date/Time   LABOPIA NONE DETECTED 03/23/2013 1520   COCAINSCRNUR NONE DETECTED 03/23/2013 1520   LABBENZ NONE DETECTED 03/23/2013 1520   AMPHETMU NONE DETECTED 03/23/2013 1520   THCU NONE DETECTED 03/23/2013 1520   LABBARB NONE DETECTED 03/23/2013 1520    Alcohol Level:   Recent Labs Lab 03/23/13 1440  ETH <11    CT of the brain  03/23/2013  Advanced periventricular white matter disease for age and evidence of remote infarcts. No definite acute hemispheric infarction or intracranial hemorrhage. No mass lesions.     MRI of the  brain  03/24/2013   1. Acute anterior left MCA territory infarct. No mass effect or hemorrhage. 2. Advanced underlying chronic small and medium-sized vessel ischemia throughout the brain.   MRA of the brain  03/24/2013     Generalized intracranial artery dolichoectasia. No left MCA branch occlusion or focal stenosis identified. Artifactual signal loss in the proximal basilar artery.     2D Echocardiogram  EF 60-65%. No thrombus. Possible small PFO by color doppler. Moderate pulmonary hypertension  TEE    Carotid Doppler  No evidence of hemodynamically significant internal carotid artery stenosis. Vertebral artery flow is antegrade.   CXR    EKG  normal sinus rhythm, PAC's noted.   Therapy Recommendations No PT, OP OT  Physical Exam   Present middle-aged [place] American lady currently not in distress but appears frustrated due to speech difficulties.Awake alert. Afebrile. Head is nontraumatic. Neck is supple without bruit. Hearing is normal. Cardiac exam no murmur or gallop. Lungs are clear to auscultation. Distal pulses are well felt Neurological Exam : . Awake alert oriented x3. Mild expressive aphasia with word finding difficulties and nonfluent speech. No paraphasic errors. Good comprehension. Mild difficulty with naming and repetition. Extraocular movements are full range without nystagmus. Blinks to threat bilaterally. Mild right lower facial weakness. Tongue is midline. Motor system exam no upper or lower eczema to drift. Slightly diminished fine finger movements on the right. Orbits left-to-right upper extremity. Symmetric lower extremity strength without drift. Sensation is intact. Coordination is slow but I correct. Gait was not tested.  ASSESSMENT [name] is a 68 y.o. female presenting with aphasia. 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 Infarct felt to be  embolic secondary to unknown source.  On no antithrombotics prior to admission. Now on clopidogrel 75 mg orally  every day for secondary stroke prevention. Patient with resultant expressive aphasia. Work up underway.  hypertension Diabetes, HgbA1c 6.2, goal < 7.0 Hyperlipidemia, LDL 104, on no statin PTA, now on lipitor 40, goal LDL < 70 for diabetics Pt's son has Fabre's disease  Hospital day # 3  TREATMENT/PLAN  Continue clopidogrel 75 mg orally every day for secondary stroke prevention (may use aspirin 325 mg alone as pt was naive prior to admission).  TEE to look for embolic source. Arranged with [facility] for today - scheduled for 1500.  If positive for PFO (patent foramen ovale), check bilateral lower extremity venous dopplers to rule out DVT as possible source of stroke. (I have made patient  NPO after midnight tonight). If TEE negative, recommend OP tele monitoring, to assess for atrial fibrillation.  OP OT and ST  SHARON BIBY, MSN, RN, ANVP-BC, ANP-BC, GNP-BC [facility] Pager: [phone] 03/26/2013 12:26 PM  I have personally obtained a history, examined the patient, evaluated imaging results, and formulated the assessment and plan of care. I agree with the above.  Delia Heady, MD

## 2013-03-26 NOTE — Progress Notes (Signed)
Patient returned from TEE at this time. Resting quietly in bed with son at side.

## 2013-03-26 NOTE — Progress Notes (Signed)
PT Cancellation Note  Patient Details Name: Rebecca [name] MRN: [id number] DOB: 30-Mar-1945   Cancelled Treatment:    Reason Eval/Treat Not Completed: Patient at procedure or test/unavailable, at TEE will follow up tomorrow.   Fabio Asa 03/26/2013, 2:09 PM

## 2013-03-26 NOTE — CV Procedure (Signed)
INDICATIONS: Stroke  PROCEDURE:   Informed consent was obtained prior to the procedure. The risks, benefits and alternatives for the procedure were discussed and the patient comprehended these risks.  Risks include, but are not limited to, cough, sore throat, vomiting, nausea, somnolence, esophageal and stomach trauma or perforation, bleeding, low blood pressure, aspiration, pneumonia, infection, trauma to the teeth and death.    After a procedural time-out, the oropharynx was anesthetized with 20% benzocaine spray. The patient was given 3 mg versed and 25 mcg fentanyl for moderate sedation.   The transesophageal probe was inserted in the esophagus and stomach without difficulty and multiple views were obtained.  The patient was kept under observation until the patient left the procedure room.  The patient left the procedure room in stable condition.   Agitated microbubble saline contrast was administered.  COMPLICATIONS:   There were no immediate complications.  FINDINGS:  Severely dilated left atrium Large LA appendage with borderline emptying velocities. No LA thrombus. Severe LV hypertrophy with preserved systolic function. No significant valvular disease (mild central MR). Very mild aortic atherosclerosis.  RECOMMENDATIONS:   Although there was no LA clot at the time of the study, there is strong evidence suggestive of recent AF and high risk of LA thrombus. Recommend anticoagulation.   Time Spent Directly with the Patient:  45 minutes   Ela Moffat 03/26/2013, 3:42 PM

## 2013-03-27 ENCOUNTER — Encounter (HOSPITAL_COMMUNITY): Payer: Self-pay | Admitting: Cardiovascular Disease

## 2013-03-27 DIAGNOSIS — I429 Cardiomyopathy, unspecified: Secondary | ICD-10-CM

## 2013-03-27 DIAGNOSIS — I6350 Cerebral infarction due to unspecified occlusion or stenosis of unspecified cerebral artery: Secondary | ICD-10-CM

## 2013-03-27 LAB — GLUCOSE, CAPILLARY
GLUCOSE-CAPILLARY: 78 mg/dL (ref 70–99)
GLUCOSE-CAPILLARY: 110 mg/dL — AB (ref 70–99)

## 2013-03-27 MED ORDER — RIVAROXABAN 20 MG PO TABS
20.0000 mg | ORAL_TABLET | Freq: Every day | ORAL | Status: DC
  Administered 2013-03-27: 20 mg via ORAL
  Filled 2013-03-27: qty 1

## 2013-03-27 MED ORDER — LISINOPRIL 5 MG PO TABS: 5.0000 mg | ORAL_TABLET | Freq: Every day | ORAL | Status: DC

## 2013-03-27 MED ORDER — RIVAROXABAN 20 MG PO TABS: 20.0000 mg | ORAL_TABLET | Freq: Every day | ORAL | Status: DC

## 2013-03-27 MED ORDER — ATORVASTATIN CALCIUM 40 MG PO TABS: 40.0000 mg | ORAL_TABLET | Freq: Every day | ORAL | Status: DC

## 2013-03-27 MED ORDER — ASPIRIN 81 MG PO CHEW: 81.0000 mg | CHEWABLE_TABLET | Freq: Every day | ORAL | Status: DC

## 2013-03-27 MED ORDER — HYDROCHLOROTHIAZIDE 25 MG PO TABS: 25.0000 mg | ORAL_TABLET | Freq: Every day | ORAL | Status: DC

## 2013-03-27 NOTE — Progress Notes (Signed)
SLP Cancellation Note  Patient Details Name: Rebecca [name] MRN: [id number] DOB: [birth year]   Cancelled treatment:       Reason Eval/Treat Not Completed: Patient declined, no reason specified   Nashley Cordoba, Riley Nearing 03/27/2013, 0930 AM

## 2013-03-27 NOTE — Progress Notes (Signed)
Family Medicine Teaching Service Daily Progress Note Intern Pager: [phone]  Patient name: Amado CoeConstance Hockett Medical record number: [id number] Date of birth: 09/29/1945 Age: 68 y.o. Gender: female  Primary Care Provider: Burtis JunesBLOUNT,ALVIN VINCENT, MD Consultants: Neurology, Cardiology   Code Status: Full   Pt Overview and Major Events to Date:  1/4: MRI Acute anterior left MCA territory infarct. 1/7: TEE   Assessment and Plan: Amado CoeConstance Snider is a 68 y.o. female presenting with acute onset expressive aphasia. Found to have acute anterior left MCA territory infarct. Source is undetermined but will attain TEE, if negative will have loop recoder. PMH is significant for HTN, DM, ?HLD - pt cannot provide home medication regimen and son is unsure of what medications she takes.   # Acute anterior left MCA territory infarct.  - Atorvastatin 40 mg QD  - TEE: no PFO - Plavix 75mg  qd  - PT/OT/SLP: rec home health - Cardio rec's: No LA clot, strong evidence suggestive of recent AF and high risk of LA thrombus. Recommend anticoagulation.  - Neuro: OP tele monitoring. To assess for Afib. OP OT and ST.   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   # Diabetes:  - SSI moderate until home regimen determined  - HgbA1c 6.2  #HTN: stable   - metoprolol 25 mg held and discontinued  - start lisinopril 5 and HCTZ 25 mg  - monitor BP, permissive HTN given stroke, as above   #Social: Arranging home health options.  - ST:  - OT: supervision/assistance   FEN/GI: 1/2NS at 100 mL/h Prophylaxis: SCD's for DVT prophylaxis with question of stroke  Disposition:  pending improvement   Subjective: patient's aphasia is unchanged. Still frustrated with her symptoms.   Objective: Temp:  [97.9 F (36.6 C)-98.9 F (37.2 C)] 98.7 F (37.1 C) (01/08 0500) Pulse Rate:  [56-83] 68 (01/08 0500) Resp:  [12-23] 20 (01/08 0500) BP: (136-192)/(68-129) 150/82 mmHg (01/08 0500) SpO2:  [94 %-100 %] 98 % (01/08 0500) Physical Exam: General: elderly adult female, alert, cooperative in NAD, sitting on side of bed.  Cardiovascular: RRR, normal heart sounds, no murmurs appreciated  Respiratory: CTAB, no wheezes or crackles.  Extremities: warm and well perfused.  Skin: warm and dry  Neuro: A+O. Expressive aphasia present. Follows commands. ,   Laboratory:  Recent Labs Lab 03/23/13 1440 03/23/13 1501 03/26/13 1020  WBC 4.0  --  4.2  HGB 13.9 15.6* 13.3  HCT 41.0 46.0 39.7  PLT 187  --  189    Recent Labs Lab 03/23/13 1440 03/23/13 1501 03/26/13 1020  NA 144 145 145  K 3.9 3.8 4.1  CL 104 106 105  CO2 25  --  26  BUN 18 19 14   CREATININE 0.89 1.00 0.93  CALCIUM 9.3  --  8.9  PROT 8.1  --   --   BILITOT 0.6  --   --   ALKPHOS 37*  --   --   ALT 13  --   --   AST 28  --   --   GLUCOSE 103* 107* 82    Recent Labs Lab 03/26/13 1211 03/26/13  1534 03/26/13 1705 03/26/13 2047 03/27/13 0616  GLUCAP 77 76 74 73 78    Urine cx: multiple bacterial morphotype's present.   HgbA1c: 6.2  Imaging/Diagnostic Tests: EXAM:  MRI HEAD WITHOUT CONTRAST  MRA HEAD WITHOUT CONTRAST IMPRESSION:  1. Acute anterior left MCA territory infarct. No mass effect or  hemorrhage.  2. Advanced underlying chronic small and medium-sized vessel  ischemia throughout the brain.  3. Generalized intracranial artery dolichoectasia. No left MCA  branch occlusion or focal stenosis identified. Artifactual signal  loss in the proximal basilar artery  TEE 1/7 FINDINGS:  Severely dilated left atrium  Large LA appendage with borderline emptying velocities.  No LA  thrombus.  Severe LV hypertrophy with preserved systolic function.  No significant valvular disease (mild central MR).  Very mild aortic atherosclerosis.   Myra Rude, MD 03/27/2013, 7:02 AM PGY-1, [facility] Family Medicine FPTS Intern pager: [phone], text pages welcome

## 2013-03-27 NOTE — Progress Notes (Signed)
Stroke Team Progress Note  HISTORY Rebecca [name] is an 68 y.o. female, right handed, with a past medical history significant for HTN, DM, brought to [facility] ED 03/23/2013 by family for further evaluation of confusion and language disturbance. She denies having similar symptoms before. Last known well is uncertain, but around 1300 today she was at a local store and observed to be in a confused state and having difficulty expressing herself.  ED physician was unable to locate family member that lives with patient in order to establish her last known well.  Upon arrival to the ED she was described as having expressive aphasia. Son is at the bedside and said that " my mother never had a problem like this, she is slow and not able to get things together as she used to".  Denies HA, vertigo, double vision, focal weakness or numbness, unsteadiness, slurred speech, visual disturbances, or trouble with comprehension of language.  CT brain pending.   Patient was not a TPA candidate secondary to unknown time last known well. She was admitted for further evaluation and treatment.  SUBJECTIVE No complaints.  OBJECTIVE Most recent Vital Signs: Filed Vitals:   03/26/13 2152 03/27/13 0213 03/27/13 0500 03/27/13 0900  BP: 156/98 148/88 150/82 132/86  Pulse: 63 67 68 68  Temp: 98.1 F (36.7 C) 98.9 F (37.2 C) 98.7 F (37.1 C) 98.4 F (36.9 C)  TempSrc: Oral Oral Oral Oral  Resp: 20 20 20 20   Height:      Weight:      SpO2: 94% 96% 98% 100%   CBG (last 3)   Recent Labs  03/26/13 2047 03/27/13 0616 03/27/13 1214  GLUCAP 73 78 110*    IV Fluid Intake:   . sodium chloride 1,000 mL (03/26/13 0546)  . sodium chloride 20 mL/hr at 03/26/13 1848    MEDICATIONS  . atorvastatin  40 mg Oral q1800  . hydrochlorothiazide  25 mg Oral Daily  . insulin aspart  0-15 Units Subcutaneous TID WC  . lisinopril  5 mg Oral Daily  . rivaroxaban  20 mg Oral Daily   PRN:  acetaminophen, acetaminophen  Diet:   Carb Control thin liquids Activity:  OOB with assistance DVT Prophylaxis:  SCDs   CLINICALLY SIGNIFICANT STUDIES Basic Metabolic Panel:   Recent Labs Lab 03/23/13 1440 03/23/13 1501 03/26/13 1020  NA 144 145 145  K 3.9 3.8 4.1  CL 104 106 105  CO2 25  --  26  GLUCOSE 103* 107* 82  BUN 18 19 14   CREATININE 0.89 1.00 0.93  CALCIUM 9.3  --  8.9   Liver Function Tests:   Recent Labs Lab 03/23/13 1440  AST 28  ALT 13  ALKPHOS 37*  BILITOT 0.6  PROT 8.1  ALBUMIN 3.8   CBC:   Recent Labs Lab 03/23/13 1440 03/23/13 1501 03/26/13 1020  WBC 4.0  --  4.2  NEUTROABS 2.0  --   --   HGB 13.9 15.6* 13.3  HCT 41.0 46.0 39.7  MCV 84.9  --  83.8  PLT 187  --  189   Coagulation:   Recent Labs Lab 03/23/13 1440  LABPROT 13.2  INR 1.02   Cardiac Enzymes:   Recent Labs Lab 03/23/13 1440 03/23/13 1514  TROPONINI <0.30 <0.30   Urinalysis:   Recent Labs Lab 03/23/13 1520  COLORURINE YELLOW  LABSPEC 1.019  PHURINE 5.5  GLUCOSEU NEGATIVE  HGBUR NEGATIVE  BILIRUBINUR NEGATIVE  KETONESUR 15*  PROTEINUR 100*  UROBILINOGEN 0.2  NITRITE NEGATIVE  LEUKOCYTESUR NEGATIVE   Lipid Panel    Component Value Date/Time   CHOL 202* 03/24/2013 0350   TRIG 66 03/24/2013 0350   HDL 85 03/24/2013 0350   CHOLHDL 2.4 03/24/2013 0350   VLDL 13 03/24/2013 0350   LDLCALC 104* 03/24/2013 0350   HgbA1C  Lab Results  Component Value Date   HGBA1C 6.2* 03/24/2013    Urine Drug Screen:     Component Value Date/Time   LABOPIA NONE DETECTED 03/23/2013 1520   COCAINSCRNUR NONE DETECTED 03/23/2013 1520   LABBENZ NONE DETECTED 03/23/2013 1520   AMPHETMU NONE DETECTED 03/23/2013 1520   THCU NONE DETECTED 03/23/2013 1520   LABBARB NONE DETECTED 03/23/2013 1520    Alcohol Level:   Recent Labs Lab 03/23/13 1440  ETH <11    CT of the brain  03/23/2013  Advanced periventricular white matter disease for age and evidence of remote infarcts. No definite acute hemispheric infarction or intracranial  hemorrhage. No mass lesions.     MRI of the brain  03/24/2013   1. Acute anterior left MCA territory infarct. No mass effect or hemorrhage. 2. Advanced underlying chronic small and medium-sized vessel ischemia throughout the brain.   MRA of the brain  03/24/2013     Generalized intracranial artery dolichoectasia. No left MCA branch occlusion or focal stenosis identified. Artifactual signal loss in the proximal basilar artery.     2D Echocardiogram  EF 60-65%. No thrombus. Possible small PFO by color doppler. Moderate pulmonary hypertension  TEE  03/26/2013  Severely dilated left atrium, large LA independent with borderline emptying velocities, no LA thrombus, severe left ventricular hypertrophy with preserved systolic function, no significant valvular disease. There is strong evidence suggestive of recent atrial fibrillation and high risk of LA thrombus. Cardiologist recommends anti-coagulation.  Carotid Doppler  No evidence of hemodynamically significant internal carotid artery stenosis. Vertebral artery flow is antegrade.    EKG  normal sinus rhythm, PAC's noted.   Therapy Recommendations No PT, OP OT  Physical Exam   Present middle-aged [place] lady currently not in distress but appears frustrated due to speech difficulties.Awake alert. Afebrile. Head is nontraumatic. Neck is supple without bruit. Hearing is normal. Cardiac exam no murmur or gallop. Lungs are clear to auscultation. Distal pulses are well felt Neurological Exam : . Awake alert oriented x3. Mild expressive aphasia with word finding difficulties and nonfluent speech. No paraphasic errors. Good comprehension. Mild difficulty with naming and repetition. Extraocular movements are full range without nystagmus. Blinks to threat bilaterally. Mild right lower facial weakness. Tongue is midline. Motor system exam no upper or lower eczema to drift. Slightly diminished fine finger movements on the right. Orbits left-to-right upper  extremity. Symmetric lower extremity strength without drift. Sensation is intact. Coordination is slow but I correct. Gait was not tested.  ASSESSMENT Rebecca [name] is a 68 y.o. female presenting with aphasia. 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 Infarct felt to be  embolic secondary to likely atrial fibrillation given TEE findings. Anti-coagulation recommended.  On no antithrombotics prior to admission. Changed to  Xarelto for secondary stroke prevention. Patient with resultant expressive aphasia. Workup completed.  hypertension Diabetes, HgbA1c 6.2, goal < 7.0 Hyperlipidemia, LDL 104, on no statin PTA, now on lipitor 40, goal LDL < 70 for diabetics Pt's son has Fabre's disease  Hospital day # 4  TREATMENT/PLAN  Agree with Xarelto for secondary stroke prevention OP OT and ST No further stroke workup indicated. Patient has a 10-15%  risk of having another stroke over the next year, the highest risk is within 2 weeks of the most recent stroke/TIA (risk of having a stroke following a stroke or TIA is the same). Ongoing risk factor control by Primary Care Physician Stroke Service will sign off. Please call should any needs arise. Follow up with Dr. Pearlean Brownie, Stroke Clinic, in 2 months.   Annie Main, MSN, RN, ANVP-BC, ANP-BC, [facility] Pager: [phone] 03/27/2013 3:23 PM  I have personally obtained a history, examined the patient, evaluated imaging results, and formulated the assessment and plan of care. I agree with the above. Delia Heady, MD

## 2013-03-27 NOTE — Progress Notes (Signed)
Patient noted to have 8 beat run of Vtach on telemetry. Reviewed past strips and noted this is not the first time. Will continue to monitor patient. Currently NSR on telemetry and sleeping quietly. Rebecca [name]

## 2013-03-27 NOTE — Progress Notes (Signed)
Discharge instructions given. Patient and her son verbalized understanding;  All questions were answered.

## 2013-03-27 NOTE — Discharge Instructions (Signed)
[name], you were admitted for a stroke. We have home health set up in order to keep working with you.  We are starting a medication called Xarelto which will help prevent a stroke.  Xarelto is a blood thinner so if you hit your head or have any bleeding anywhere on your body, please call your doctor. We are also starting a medication called atorvastatin. This help lower the cholesterol in your blood. We would also like you to take a 81 mg aspirin daily.   We are also changing your home blood pressure medications.  We would like you to stop taking metoprolol and start taking amlodipine and hydrochlorothiazide.   Please follow up with your regular doctor.    Stroke Prevention Some health problems and behaviors may make it more likely for you to have a stroke. Below are ways to lessen your risk of having a stroke.   Be active for at least 30 minutes on most or all days.  Do not smoke. Try not to be around others who smoke (secondhand smoke).  Do not drink too much alcohol.  Eat healthy foods, such as fruits and vegetables. If you were put on a specific diet, follow the diet as told.  Keep your cholesterol levels under control through diet and medicines. Look for foods that are low in saturated fat, trans fat, cholesterol, and are high in fiber.  If you have diabetes, follow all diet plans and take your medicine as told.  If you have high blood pressure (hypertension), follow all diet plans and take your medicine as told.  Keep a healthy weight. Eat foods that are low in calories, salt, saturated fat, trans fat, and cholesterol.  Do not take drugs.  Avoid birth control pills, if this applies. Talk to your doctor about the risks of taking birth control pills.  Talk to your doctor if you have sleep problems (sleep apnea).  Take all medicine as told by your doctor.  You may be told to take aspirin or blood thinner medicine. Take this medicine as told.  Understand your medicine  instructions. GET HELP RIGHT AWAY IF:  You lose feeling (numbness) or have weakness in the face, arm, or leg.  You lose feeling or have weakness on one side of the body.  You feel suddenly confused.  You have trouble talking or understanding what people are saying.  You have sudden trouble seeing in one or both eyes.  You have sudden trouble walking.  You are dizzy.  You lose your balance or your movements are clumsy (uncoordinated).  You suddenly have a very bad headache, and you do not know the cause.  You have new chest pain.  Your heart feels like it is fluttering or skipping a beat (irregular heartbeat). Do not wait to see if the symptoms above go away. Get help right away. Call your local emergency services (911 in [place] Do not drive yourself to the hospital. Document Released: 09/05/2011 Document Reviewed: 09/06/2012 [facility] Patient Information 2014 [place], [place].

## 2013-03-27 NOTE — Progress Notes (Signed)
      Patient Care Team: Burtis Junes, MD as PCP - General (Family Medicine)   HPI  Rebecca [name] is a 68 y.o. 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 Hypertension     Past Surgical History  Procedure Laterality Date  . Tee without cardioversion N/A 03/26/2013    Procedure: TRANSESOPHAGEAL ECHOCARDIOGRAM (TEE);  Surgeon: Thurmon Fair, MD;  Location: [facility] ENDOSCOPY;  Service: Cardiovascular;  Laterality: N/A;    Current Facility-Administered Medications  Medication Dose Route Frequency Provider Last Rate Last Dose  . 0.45 % sodium chloride infusion   Intravenous Continuous Stephanie Coup Street, MD 100 mL/hr at 03/26/13 0546 1,000 mL at 03/26/13 0546  . 0.9 %  sodium chloride infusion   Intravenous Continuous Thurmon Fair, MD 20 mL/hr at 03/26/13 1848    . acetaminophen (TYLENOL) tablet 650 mg  650 mg Oral Q4H PRN Stephanie Coup Street, MD   650 mg at 03/25/13 1248   Or  . acetaminophen (TYLENOL) suppository 650 mg  650 mg Rectal Q4H PRN Stephanie Coup Street, MD      . atorvastatin (LIPITOR) tablet 40 mg  40 mg Oral q1800 Myra Rude, MD   40 mg at 03/26/13 1846  . hydrochlorothiazide (HYDRODIURIL) tablet 25 mg  25 mg Oral Daily Wenda Low, MD   25 mg at 03/27/13 0953  . insulin aspart (novoLOG) injection 0-15 Units  0-15 Units Subcutaneous TID WC Stephanie Coup Street, MD      . lisinopril (PRINIVIL,ZESTRIL) tablet 5 mg  5 mg Oral Daily Wenda Low, MD   5 mg at 03/27/13 0953  . Rivaroxaban (XARELTO) tablet 20 mg  20 mg Oral Daily Andrena Mews, DO   20 mg at 03/27/13 1232     Not on File  Review of Systems negative except from HPI and PMH  Physical Exam BP 132/86  Pulse 68  Temp(Src) 98.4 F (36.9 C) (Oral)  Resp 20  Ht 5\' 1"  (1.549 m)  Wt 172 lb (78.019 kg)  BMI 32.52 kg/m2  SpO2 100% Well developed and well nourished in no acute distress HENT normal E scleral and icterus clear Neck Supple JVP flat; carotids brisk and full Clear to ausculation  Regular rate and rhythm, no murmurs gallops or rub Soft with active bowel sounds No clubbing cyanosis none Edema Alert and oriented, grossly normal motor and sensory function Skin Warm and Dry    Assessment and  Plan   FABRY cardiomyopathy  Cryptogenic stroke   not a lot of data to guide deicsion re risk stratification for SCD with Fabry  A paper from [employer] 2012 suggests tachyarrhythmia are rare; we dont know how to correlate with her very abnormal MR  Would anticipate discharge with monitor

## 2013-03-27 NOTE — Progress Notes (Signed)
FMTS Attending  Note: Cortny Bambach,MD I  have seen and examined this patient, reviewed their chart. I have discussed this patient with the resident. I agree with the resident's findings, assessment and care plan.  

## 2013-03-30 NOTE — Discharge Summary (Signed)
FMTS Attending  Note: Meagan Spease,MD I  have seen and examined this patient, reviewed their chart. I have discussed this patient with the resident. I agree with the resident's findings, assessment and care plan.  

## 2013-03-31 LAB — ALPHA GALACTOSIDASE: ALPHA GALACTOSIDASE, SERUM: 0.127 U/L (ref 0.074–0.457)

## 2013-04-11 ENCOUNTER — Other Ambulatory Visit: Payer: Self-pay

## 2013-04-11 DIAGNOSIS — I4891 Unspecified atrial fibrillation: Secondary | ICD-10-CM

## 2013-04-16 ENCOUNTER — Encounter (INDEPENDENT_AMBULATORY_CARE_PROVIDER_SITE_OTHER): Payer: Medicare Other

## 2013-04-16 ENCOUNTER — Encounter: Payer: Self-pay | Admitting: *Deleted

## 2013-04-16 DIAGNOSIS — I4891 Unspecified atrial fibrillation: Secondary | ICD-10-CM

## 2013-04-16 NOTE — Progress Notes (Unsigned)
Patient ID: Rebecca [name], female   DOB: [birth year], 68 y.o.   MRN: [id number] E-Cardio verite 30 day cardiac event monitor applied to patient.

## 2013-04-18 ENCOUNTER — Telehealth: Payer: Self-pay | Admitting: Internal Medicine

## 2013-04-18 NOTE — Telephone Encounter (Signed)
Rebecca [name] with dr coladonato's office calling for pt who states dr Graciela Husbands was to refer her to dr Arrie Aran for enzyme replacement therapy, and they haven't heard from [place], can we check with klein and call Rebecca [name] back with a referral if still needed?

## 2013-04-22 NOTE — Telephone Encounter (Signed)
Left message explaining Dr. Graciela Husbands will not be referring this pt for therapy. Dr. Graciela Husbands left message for Dr. Arrie Aran about this matter. Advised to call back with further questions/concerns. Closing encounter.

## 2013-04-23 ENCOUNTER — Telehealth: Payer: Self-pay

## 2013-04-23 NOTE — Telephone Encounter (Signed)
Spoke w/ Rebecca [name] and advised him that pt has not been seen in our office, but has appt to see Dr. Graciela Husbands in [place] on 05/01/13. He states that he spoke w/ Dr. Graciela Husbands about this pt's situation previously and that hers "is a special case". Advised him that Dr. Graciela Husbands is not in our office until Tuesday and that pt is sched to be seen in [place]. Rebecca [name] states that he will call the [place] office, as he would like to discuss this w/ Dr. Graciela Husbands.

## 2013-04-23 NOTE — Telephone Encounter (Signed)
Calling requesting a referral to neuphologist. With [facility] [employer]. Please call.

## 2013-04-29 ENCOUNTER — Encounter: Payer: Self-pay | Admitting: *Deleted

## 2013-05-01 ENCOUNTER — Ambulatory Visit (INDEPENDENT_AMBULATORY_CARE_PROVIDER_SITE_OTHER): Payer: Medicare Other | Admitting: Internal Medicine

## 2013-05-01 ENCOUNTER — Encounter: Payer: Self-pay | Admitting: Internal Medicine

## 2013-05-01 VITALS — BP 154/92 | HR 81 | Ht 60.0 in | Wt 175.0 lb

## 2013-05-01 DIAGNOSIS — Z720 Tobacco use: Secondary | ICD-10-CM

## 2013-05-01 DIAGNOSIS — E7521 Fabry (-Anderson) disease: Secondary | ICD-10-CM

## 2013-05-01 DIAGNOSIS — I4891 Unspecified atrial fibrillation: Secondary | ICD-10-CM

## 2013-05-01 DIAGNOSIS — F17200 Nicotine dependence, unspecified, uncomplicated: Secondary | ICD-10-CM

## 2013-05-01 DIAGNOSIS — E756 Lipid storage disorder, unspecified: Secondary | ICD-10-CM

## 2013-05-01 DIAGNOSIS — I6350 Cerebral infarction due to unspecified occlusion or stenosis of unspecified cerebral artery: Secondary | ICD-10-CM

## 2013-05-01 DIAGNOSIS — I639 Cerebral infarction, unspecified: Secondary | ICD-10-CM

## 2013-05-01 HISTORY — DX: Tobacco use: Z72.0

## 2013-05-01 HISTORY — DX: Fabry (-anderson) disease: E75.21

## 2013-05-01 HISTORY — DX: Unspecified atrial fibrillation: I48.91

## 2013-05-01 NOTE — Assessment & Plan Note (Signed)
Identified in her event recorder. We will terminate the event recorder. We'll stop her aspirin. We'll continue her on Rivaroxaban

## 2013-05-01 NOTE — Assessment & Plan Note (Addendum)
Dr Reynolds Bowl willl arrange for enzyme replacement therapy

## 2013-05-01 NOTE — Progress Notes (Signed)
      Patient Care Team: Burtis Junes, MD as PCP - General (Family Medicine)   HPI  Rebecca [name] is a 68 y.o. 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 An event recorder so that we demonstrated atrial fibrillation. She is currently on Rivaroxaban   She's had a vast improvement in her aphasic effects; she is also ambulating quite well  Past Medical History  Diagnosis Date  . Hypertension   . 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 Expressive aphasia 03/23/2013  . Atrial fibrillation 05/01/2013  . Tobacco abuse 05/01/2013    Past Surgical History  Procedure Laterality Date  . Tee without cardioversion N/A 03/26/2013    Procedure: TRANSESOPHAGEAL ECHOCARDIOGRAM (TEE);  Surgeon: Thurmon Fair, MD;  Location: [facility] ENDOSCOPY;  Service: Cardiovascular;  Laterality: N/A;    Current Outpatient Prescriptions  Medication Sig Dispense Refill  . atorvastatin (LIPITOR) 40 MG tablet Take 1 tablet (40 mg total) by mouth daily at 6 PM.  30 tablet  0  . hydrochlorothiazide (HYDRODIURIL) 25 MG tablet Take 1 tablet (25 mg total) by mouth daily.  30 tablet  0  . lisinopril (PRINIVIL,ZESTRIL) 5 MG tablet Take 1 tablet (5 mg total) by mouth daily.  30 tablet  0  . Rivaroxaban (XARELTO) 20 MG TABS tablet Take 1 tablet (20 mg total) by mouth daily.  30 tablet  0   No current facility-administered medications for this visit.    Not on File  Review of Systems negative except from HPI and PMH  Physical Exam BP 154/92  Pulse 81  Ht 5' (1.524 m)  Wt 175 lb (79.379 kg)  BMI 34.18 kg/m2 Well developed and well nourished in no acute distress HENT normal E scleral and icterus clear Neck Supple JVP flat; carotids brisk and full Clear to ausculation  Regular rate and rhythm, no murmurs gallops or rub Soft with active bowel sounds No clubbing cyanosis none Edema Alert and oriented, grossly normal motor and sensory function, walking with cane  Speech almost  normal Skin Warm and Dry   event recorder was reviewed and demonstrated atrial fibrillation  Assessment and  Plan

## 2013-05-01 NOTE — Patient Instructions (Signed)
Your physician has recommended you make the following change in your medication:  1) Stop Aspirin  Your physician wants you to follow-up in: 6 months with [employer], PA-C.  You will receive a reminder letter in the mail two months in advance. If you don't receive a letter, please call our office to schedule the follow-up appointment.

## 2013-05-02 ENCOUNTER — Telehealth: Payer: Self-pay | Admitting: Internal Medicine

## 2013-05-02 NOTE — Telephone Encounter (Signed)
Has this pt ever seen Dr Arrie Aran before?  Is this a new referral?  What is it for? Was this already ordered and pt is calling to follow up about when the appt is? If this is the case did you check with Wyvonnia Lora or Bonita Quin to see if they called pt with an appointment?

## 2013-05-02 NOTE — Telephone Encounter (Signed)
New message    Referral to Dr. Arrie Aran

## 2013-05-07 NOTE — Telephone Encounter (Signed)
Follow up   Faxing over form today . Any questions to give him call.

## 2013-05-09 ENCOUNTER — Telehealth: Payer: Self-pay | Admitting: Internal Medicine

## 2013-05-09 NOTE — Telephone Encounter (Signed)
New problem    Pt need to speak to nurse and Dr Graciela Husbands for pt to take infusions that her 2 sons takes. Pt needs a referral to Dr Arrie Aran for this infusions.

## 2013-05-13 NOTE — Telephone Encounter (Signed)
Referral paperwork in process now

## 2013-05-20 NOTE — Telephone Encounter (Signed)
Dr. Arrie Aran referral paperwork faxed

## 2013-07-04 ENCOUNTER — Telehealth: Payer: Self-pay | Admitting: *Deleted

## 2013-07-04 NOTE — Telephone Encounter (Signed)
Patient requests xarelto samples. I will place at the front desk for pick up. 

## 2013-07-07 ENCOUNTER — Other Ambulatory Visit (HOSPITAL_COMMUNITY): Payer: Self-pay | Admitting: Nephrology

## 2013-07-10 ENCOUNTER — Encounter: Payer: Self-pay | Admitting: *Deleted

## 2013-07-11 NOTE — Telephone Encounter (Signed)
This encounter was created in error - please disregard.

## 2013-07-25 ENCOUNTER — Telehealth: Payer: Self-pay | Admitting: *Deleted

## 2013-07-25 NOTE — Telephone Encounter (Signed)
Patient requests xarelto samples. I will place at the front desk for pick up. 

## 2013-08-07 ENCOUNTER — Ambulatory Visit: Payer: Self-pay | Admitting: Podiatry

## 2013-08-12 ENCOUNTER — Ambulatory Visit (INDEPENDENT_AMBULATORY_CARE_PROVIDER_SITE_OTHER): Payer: Medicare Other

## 2013-08-12 VITALS — BP 122/74 | HR 70 | Resp 12

## 2013-08-12 DIAGNOSIS — E1149 Type 2 diabetes mellitus with other diabetic neurological complication: Secondary | ICD-10-CM

## 2013-08-12 DIAGNOSIS — M79609 Pain in unspecified limb: Secondary | ICD-10-CM

## 2013-08-12 DIAGNOSIS — B351 Tinea unguium: Secondary | ICD-10-CM

## 2013-08-12 DIAGNOSIS — I6350 Cerebral infarction due to unspecified occlusion or stenosis of unspecified cerebral artery: Secondary | ICD-10-CM

## 2013-08-12 DIAGNOSIS — E1142 Type 2 diabetes mellitus with diabetic polyneuropathy: Secondary | ICD-10-CM

## 2013-08-12 DIAGNOSIS — L299 Pruritus, unspecified: Secondary | ICD-10-CM

## 2013-08-12 DIAGNOSIS — E1140 Type 2 diabetes mellitus with diabetic neuropathy, unspecified: Secondary | ICD-10-CM

## 2013-08-12 NOTE — Patient Instructions (Signed)
Diabetes and Foot Care Diabetes may cause you to have problems because of poor blood supply (circulation) to your feet and legs. This may cause the skin on your feet to become thinner, break easier, and heal more slowly. Your skin may become dry, and the skin may peel and crack. You may also have nerve damage in your legs and feet causing decreased feeling in them. You may not notice minor injuries to your feet that could lead to infections or more serious problems. Taking care of your feet is one of the most important things you can do for yourself.  HOME CARE INSTRUCTIONS  Wear shoes at all times, even in the house. Do not go barefoot. Bare feet are easily injured.  Check your feet daily for blisters, cuts, and redness. If you cannot see the bottom of your feet, use a mirror or ask someone for help.  Wash your feet with warm water (do not use hot water) and mild soap. Then pat your feet and the areas between your toes until they are completely dry. Do not soak your feet as this can dry your skin.  Apply a moisturizing lotion or petroleum jelly (that does not contain alcohol and is unscented) to the skin on your feet and to dry, brittle toenails. Do not apply lotion between your toes.  Trim your toenails straight across. Do not dig under them or around the cuticle. File the edges of your nails with an emery board or nail file.  Do not cut corns or calluses or try to remove them with medicine.  Wear clean socks or stockings every day. Make sure they are not too tight. Do not wear knee-high stockings since they may decrease blood flow to your legs.  Wear shoes that fit properly and have enough cushioning. To break in new shoes, wear them for just a few hours a day. This prevents you from injuring your feet. Always look in your shoes before you put them on to be sure there are no objects inside.  Do not cross your legs. This may decrease the blood flow to your feet.  If you find a minor scrape,  cut, or break in the skin on your feet, keep it and the skin around it clean and dry. These areas may be cleansed with mild soap and water. Do not cleanse the area with peroxide, alcohol, or iodine.  When you remove an adhesive bandage, be sure not to damage the skin around it.  If you have a wound, look at it several times a day to make sure it is healing.  Do not use heating pads or hot water bottles. They may burn your skin. If you have lost feeling in your feet or legs, you may not know it is happening until it is too late.  Make sure your health care provider performs a complete foot exam at least annually or more often if you have foot problems. Report any cuts, sores, or bruises to your health care provider immediately. SEEK MEDICAL CARE IF:   You have an injury that is not healing.  You have cuts or breaks in the skin.  You have an ingrown nail.  You notice redness on your legs or feet.  You feel burning or tingling in your legs or feet.  You have pain or cramps in your legs and feet.  Your legs or feet are numb.  Your feet always feel cold. SEEK IMMEDIATE MEDICAL CARE IF:   There is increasing redness,   swelling, or pain in or around a wound.  There is a red line that goes up your leg.  Pus is coming from a wound.  You develop a fever or as directed by your health care provider.  You notice a bad smell coming from an ulcer or wound. Document Released: 03/03/2000 Document Revised: 11/06/2012 Document Reviewed: 08/13/2012 [employer] Patient Information 2014 [employer].  

## 2013-08-12 NOTE — Progress Notes (Signed)
   Subjective:    Patient ID: Rebecca [name], female    DOB: Apr 22, 1945, 68 y.o.   MRN: [id number]  HPI  PT STATED SHE IS DIABETIC AND NEED TOENAILS TRIM.    Review of Systems  Respiratory: Positive for shortness of breath and wheezing.   Cardiovascular: Positive for palpitations and leg swelling.  Musculoskeletal: Positive for gait problem.  Skin: Positive for rash.       Objective:   Physical Exam 68 year old [place] American female presents at this time is to be well-developed well-nourished of the somewhat slow in her demeanor patient is status post a stroke within the last 6-9 months does have sometimes difficulty in putting or thoughts together however otherwise communicates well patient does have some hypersensitivity or her feet during the exam evaluation. 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 Patient is a scheduled have care and followup on this with some enzyme type treatment I am not familiar with. At this time patient does have thick brittle crumbly dystrophic gratified nails 1 through 5 bilateral patient been applying nail polish or his old nail polish on her nails currently the nails are tender both on palpation and attempted debridement at this time. There friable discolored dark and brittle in crumbly 1 through 5 bilateral interdigitally no maceration is noted patient may have a slight moccasin distribution of erythematous plaque are dry scaling fissured skin in particular run the heel areas. There is also nucleated keratotic lesion sub-hallux MTP joint left foot which a pinpoint bleeding which on debridement revealed pinpoint bleeding possibly verrucoid  appearance of the lesion. 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 On Semmes Weinstein testing intact sensation of dorsum of the foot and ankle areas decreased sensation to the plantar forefoot and arch. There is normal plantar response and DTRs.       Assessment & Plan:  Assessments at this time #1 onychomycosis and the presence of the nails 1 through 5 bilateral painful tender symptomatic all debris and the presence of pain in symptomology as well as diabetes with history peripheral neuropathy decreased epicritic sensation was confirmed on toes and plantar forefoot there is onychomycosis nails we did give the option of topical treatment however patient declined to accept the treatment at this time was told to take a year of topical applications once daily patient also is advised diabetic footcare recommendations for shoes and possibly obtain diabetic shoes for her at her future no barefoot no flimsy shoes or flip-flops are recommended. Patient is following up with her dermatologist regarding her skin conditions and is advised to continue to do so. Nails painful mycotic brittle nails 1 through 5 bilateral debridement at this time return in 3 months for continued diabetic foot and mycotic nail care next  Alvan Dame DPM

## 2013-08-13 ENCOUNTER — Other Ambulatory Visit: Payer: Self-pay

## 2013-08-19 ENCOUNTER — Telehealth: Payer: Self-pay | Admitting: *Deleted

## 2013-08-19 NOTE — Telephone Encounter (Signed)
Faxed patient assistance application to [employer] for help with patients xarelto, I did not get any tax return forms to fax with this application.

## 2013-08-21 ENCOUNTER — Other Ambulatory Visit (HOSPITAL_COMMUNITY): Payer: Self-pay | Admitting: Family Medicine

## 2013-08-21 DIAGNOSIS — Z1231 Encounter for screening mammogram for malignant neoplasm of breast: Secondary | ICD-10-CM

## 2013-08-26 ENCOUNTER — Ambulatory Visit (HOSPITAL_COMMUNITY)
Admission: RE | Admit: 2013-08-26 | Discharge: 2013-08-26 | Disposition: A | Discharge status: Home / Self Care | Payer: Medicare Other | Source: Ambulatory Visit | Attending: Family Medicine | Admitting: Family Medicine

## 2013-08-26 ENCOUNTER — Telehealth: Payer: Self-pay | Admitting: *Deleted

## 2013-08-26 ENCOUNTER — Other Ambulatory Visit: Payer: Self-pay | Admitting: Family Medicine

## 2013-08-26 ENCOUNTER — Other Ambulatory Visit (HOSPITAL_COMMUNITY): Payer: Self-pay | Admitting: Family Medicine

## 2013-08-26 DIAGNOSIS — R079 Chest pain, unspecified: Secondary | ICD-10-CM | POA: Insufficient documentation

## 2013-08-26 DIAGNOSIS — I517 Cardiomegaly: Secondary | ICD-10-CM | POA: Insufficient documentation

## 2013-08-26 DIAGNOSIS — Z1231 Encounter for screening mammogram for malignant neoplasm of breast: Secondary | ICD-10-CM | POA: Insufficient documentation

## 2013-08-26 DIAGNOSIS — I77819 Aortic ectasia, unspecified site: Secondary | ICD-10-CM | POA: Insufficient documentation

## 2013-08-26 DIAGNOSIS — N644 Mastodynia: Secondary | ICD-10-CM

## 2013-08-26 NOTE — Telephone Encounter (Signed)
2 week supply xarelto samples placed up front office for patient

## 2013-08-29 ENCOUNTER — Ambulatory Visit
Admission: RE | Admit: 2013-08-29 | Discharge: 2013-08-29 | Disposition: A | Discharge status: Home / Self Care | Payer: Medicare Other | Source: Ambulatory Visit | Attending: Family Medicine | Admitting: Family Medicine

## 2013-08-29 DIAGNOSIS — N644 Mastodynia: Secondary | ICD-10-CM

## 2013-08-30 ENCOUNTER — Other Ambulatory Visit: Payer: Self-pay | Admitting: Interventional Cardiology

## 2013-09-03 ENCOUNTER — Encounter: Payer: Self-pay | Admitting: Cardiology

## 2013-09-09 ENCOUNTER — Other Ambulatory Visit: Payer: Self-pay | Admitting: *Deleted

## 2013-09-09 MED ORDER — RIVAROXABAN 20 MG PO TABS: 20.0000 mg | ORAL_TABLET | Freq: Every day | ORAL | Status: DC

## 2013-09-23 ENCOUNTER — Telehealth: Payer: Self-pay | Admitting: *Deleted

## 2013-09-23 NOTE — Telephone Encounter (Signed)
Patient requests xarelto samples. I will place at the front desk for pick up. 

## 2013-09-24 ENCOUNTER — Telehealth: Payer: Self-pay | Admitting: Internal Medicine

## 2013-09-24 NOTE — Telephone Encounter (Signed)
Walk In pt Form " Patient Assistance Form" dropped off gave to [facility] 7.8.15/km

## 2013-09-26 ENCOUNTER — Telehealth: Payer: Self-pay | Admitting: Internal Medicine

## 2013-09-26 NOTE — Telephone Encounter (Signed)
New message     Pt dropped off forms for Dr Graciela Husbands to complete to get assistance with her medicatins.  When completed, please mail to patients home.

## 2013-10-01 NOTE — Telephone Encounter (Signed)
Informed pt that Dr. Graciela Husbands completed medication patient assistance form (for Xarelto). Completed form mailed to her home address per her request.

## 2013-10-06 ENCOUNTER — Encounter: Payer: Self-pay | Admitting: Internal Medicine

## 2013-10-10 ENCOUNTER — Ambulatory Visit (HOSPITAL_COMMUNITY)
Admission: RE | Admit: 2013-10-10 | Discharge: 2013-10-10 | Disposition: A | Discharge status: Home / Self Care | Payer: Medicare Other | Source: Ambulatory Visit | Attending: Nephrology | Admitting: Nephrology

## 2013-10-10 ENCOUNTER — Encounter (HOSPITAL_COMMUNITY): Payer: Self-pay

## 2013-10-10 DIAGNOSIS — E756 Lipid storage disorder, unspecified: Secondary | ICD-10-CM | POA: Diagnosis present

## 2013-10-10 MED ORDER — AGALSIDASE BETA 5 MG IV SOLR
1.0000 mg/kg | INTRAVENOUS | Status: DC
  Administered 2013-10-10: 80 mg via INTRAVENOUS
  Filled 2013-10-10: qty 16

## 2013-10-10 MED ORDER — ACETAMINOPHEN 500 MG PO TABS
1000.0000 mg | ORAL_TABLET | ORAL | Status: DC
  Administered 2013-10-10: 500 mg via ORAL
  Filled 2013-10-10: qty 2

## 2013-10-10 MED ORDER — SODIUM CHLORIDE 0.9 % IV SOLN
INTRAVENOUS | Status: DC
  Administered 2013-10-10: 250 mL via INTRAVENOUS

## 2013-10-10 NOTE — Discharge Instructions (Signed)
FABRAZYME Agalsidase Beta injection What is this medicine? AGALSIDASE BETA is used to replace an enzyme that is missing in patients with Fabry disease. It is not a cure. This medicine may be used for other purposes; ask your health care provider or pharmacist if you have questions. COMMON BRAND NAME(S): Fabrazyme What should I tell my health care provider before I take this medicine? They need to know if you have any of these conditions: -heart disease -an unusual or allergic reaction to agalsidase beta, mannitol, other medicines, foods, dyes, or preservatives -pregnant or trying to get pregnant -breast-feeding How should I use this medicine? This medicine is for infusion into a vein. It is given by a health care professional in a hospital or clinic setting. Talk to your pediatrician regarding the use of this medicine in children. Special care may be needed. Overdosage: If you think you have taken too much of this medicine contact a poison control center or emergency room at once. NOTE: This medicine is only for you. Do not share this medicine with others. What if I miss a dose? It is important not to miss your dose. Call your doctor or health care professional if you are unable to keep an appointment. What may interact with this medicine? -amiodarone -chloroquine -gentamicin -hydroxychloroquine -monobenzone This list may not describe all possible interactions. Give your health care provider a list of all the medicines, herbs, non-prescription drugs, or dietary supplements you use. Also tell them if you smoke, drink alcohol, or use illegal drugs. Some items may interact with your medicine. What should I watch for while using this medicine? Visit your doctor or health care professional for regular checks on your progress. Tell your doctor or healthcare professional if your symptoms do not start to get better or if they get worse. There is a registry for patients with Fabry disease. The  registry is used to gather information about the disease and its effects. Talk to your health care provider if you would like to join the registry. What side effects may I notice from receiving this medicine? Side effects that you should report to your doctor or health care professional as soon as possible: -allergic reactions like skin rash, itching or hives, swelling of the face, lips, or tongue -breathing problems -chest pain, tightness -depression -dizziness -fast, irregular heart beat -swelling of the arms or legs Side effects that usually do not require medical attention (report to your doctor or health care professional if they continue or are bothersome): -aches or pains -anxiety -fever or chills at the time of injection -headache -nausea, vomiting -stomach pain, upset This list may not describe all possible side effects. Call your doctor for medical advice about side effects. You may report side effects to FDA at 1-800-FDA-1088. Where should I keep my medicine? This drug is given in a hospital or clinic and will not be stored at home. NOTE: This sheet is a summary. It may not cover all possible information. If you have questions about this medicine, talk to your doctor, pharmacist, or health care provider.  2015, Elsevier/Gold Standard. (2005-11-13 12:25:00)

## 2013-10-13 ENCOUNTER — Encounter (HOSPITAL_COMMUNITY): Admission: RE | Admit: 2013-10-13 | Payer: Medicare Other | Source: Ambulatory Visit

## 2013-10-13 ENCOUNTER — Other Ambulatory Visit (HOSPITAL_COMMUNITY): Payer: Self-pay | Admitting: Nephrology

## 2013-10-21 ENCOUNTER — Telehealth: Payer: Self-pay

## 2013-10-21 NOTE — Telephone Encounter (Signed)
Patient called for samples of xarelto placed samples up front 

## 2013-10-24 ENCOUNTER — Encounter (HOSPITAL_COMMUNITY)
Admission: RE | Admit: 2013-10-24 | Discharge: 2013-10-24 | Disposition: A | Discharge status: Home / Self Care | Payer: Medicare Other | Source: Ambulatory Visit | Attending: Nephrology | Admitting: Nephrology

## 2013-10-24 ENCOUNTER — Encounter (HOSPITAL_COMMUNITY): Payer: Self-pay

## 2013-10-24 DIAGNOSIS — I4891 Unspecified atrial fibrillation: Secondary | ICD-10-CM | POA: Insufficient documentation

## 2013-10-24 DIAGNOSIS — I10 Essential (primary) hypertension: Secondary | ICD-10-CM | POA: Insufficient documentation

## 2013-10-24 DIAGNOSIS — E119 Type 2 diabetes mellitus without complications: Secondary | ICD-10-CM | POA: Insufficient documentation

## 2013-10-24 DIAGNOSIS — E756 Lipid storage disorder, unspecified: Secondary | ICD-10-CM | POA: Insufficient documentation

## 2013-10-24 DIAGNOSIS — R944 Abnormal results of kidney function studies: Secondary | ICD-10-CM | POA: Insufficient documentation

## 2013-10-24 DIAGNOSIS — F17200 Nicotine dependence, unspecified, uncomplicated: Secondary | ICD-10-CM | POA: Insufficient documentation

## 2013-10-24 DIAGNOSIS — R4701 Aphasia: Secondary | ICD-10-CM | POA: Diagnosis not present

## 2013-10-24 MED ORDER — SODIUM CHLORIDE 0.9 % IV SOLN
1.0000 mg/kg | INTRAVENOUS | Status: DC
  Administered 2013-10-24: 80 mg via INTRAVENOUS
  Filled 2013-10-24: qty 16

## 2013-10-24 MED ORDER — ACETAMINOPHEN 500 MG PO TABS: 1000.0000 mg | ORAL_TABLET | ORAL | Status: DC

## 2013-10-24 MED ORDER — SODIUM CHLORIDE 0.9 % IV SOLN
INTRAVENOUS | Status: DC
  Administered 2013-10-24: 08:00:00 via INTRAVENOUS

## 2013-10-24 NOTE — Discharge Instructions (Signed)
Agalsidase Beta injection °What is this medicine? °AGALSIDASE BETA is used to replace an enzyme that is missing in patients with Fabry disease. It is not a cure. °This medicine may be used for other purposes; ask your health care provider or pharmacist if you have questions. °COMMON BRAND NAME(S): Fabrazyme °What should I tell my health care provider before I take this medicine? °They need to know if you have any of these conditions: °-heart disease °-an unusual or allergic reaction to agalsidase beta, mannitol, other medicines, foods, dyes, or preservatives °-pregnant or trying to get pregnant °-breast-feeding °How should I use this medicine? °This medicine is for infusion into a vein. It is given by a health care professional in a hospital or clinic setting. °Talk to your pediatrician regarding the use of this medicine in children. Special care may be needed. °Overdosage: If you think you have taken too much of this medicine contact a poison control center or emergency room at once. °NOTE: This medicine is only for you. Do not share this medicine with others. °What if I miss a dose? °It is important not to miss your dose. Call your doctor or health care professional if you are unable to keep an appointment. °What may interact with this medicine? °-amiodarone °-chloroquine °-gentamicin °-hydroxychloroquine °-monobenzone °This list may not describe all possible interactions. Give your health care provider a list of all the medicines, herbs, non-prescription drugs, or dietary supplements you use. Also tell them if you smoke, drink alcohol, or use illegal drugs. Some items may interact with your medicine. °What should I watch for while using this medicine? °Visit your doctor or health care professional for regular checks on your progress. Tell your doctor or healthcare professional if your symptoms do not start to get better or if they get worse. °There is a registry for patients with Fabry disease. The registry is  used to gather information about the disease and its effects. Talk to your health care provider if you would like to join the registry. °What side effects may I notice from receiving this medicine? °Side effects that you should report to your doctor or health care professional as soon as possible: °-allergic reactions like skin rash, itching or hives, swelling of the face, lips, or tongue °-breathing problems °-chest pain, tightness °-depression °-dizziness °-fast, irregular heart beat °-swelling of the arms or legs °Side effects that usually do not require medical attention (report to your doctor or health care professional if they continue or are bothersome): °-aches or pains °-anxiety °-fever or chills at the time of injection °-headache °-nausea, vomiting °-stomach pain, upset °This list may not describe all possible side effects. Call your doctor for medical advice about side effects. You may report side effects to FDA at 1-800-FDA-1088. °Where should I keep my medicine? °This drug is given in a hospital or clinic and will not be stored at home. °NOTE: This sheet is a summary. It may not cover all possible information. If you have questions about this medicine, talk to your doctor, pharmacist, or health care provider. °© 2015, [employer]vier/[employer]. (2005-11-13 12:25:00) ° °

## 2013-10-28 ENCOUNTER — Ambulatory Visit: Payer: Medicare Other | Admitting: Cardiology

## 2013-11-07 ENCOUNTER — Other Ambulatory Visit: Payer: Self-pay | Admitting: Interventional Cardiology

## 2013-11-07 ENCOUNTER — Encounter (HOSPITAL_COMMUNITY)
Admission: RE | Admit: 2013-11-07 | Discharge: 2013-11-07 | Disposition: A | Discharge status: Home / Self Care | Payer: Medicare Other | Source: Ambulatory Visit | Attending: Nephrology | Admitting: Nephrology

## 2013-11-07 ENCOUNTER — Encounter (HOSPITAL_COMMUNITY): Payer: Self-pay

## 2013-11-07 DIAGNOSIS — E756 Lipid storage disorder, unspecified: Secondary | ICD-10-CM | POA: Diagnosis not present

## 2013-11-07 MED ORDER — SODIUM CHLORIDE 0.9 % IV SOLN
1.0000 mg/kg | INTRAVENOUS | Status: DC
  Administered 2013-11-07: 80 mg via INTRAVENOUS
  Filled 2013-11-07: qty 16

## 2013-11-07 MED ORDER — ACETAMINOPHEN 500 MG PO TABS: 1000.0000 mg | ORAL_TABLET | ORAL | Status: DC

## 2013-11-07 MED ORDER — SODIUM CHLORIDE 0.9 % IV SOLN
INTRAVENOUS | Status: DC
  Administered 2013-11-07: 08:00:00 via INTRAVENOUS

## 2013-11-11 ENCOUNTER — Ambulatory Visit: Payer: Medicare Other

## 2013-11-11 ENCOUNTER — Encounter (HOSPITAL_COMMUNITY): Payer: Medicare Other

## 2013-11-21 ENCOUNTER — Encounter (HOSPITAL_COMMUNITY): Payer: Self-pay

## 2013-11-21 ENCOUNTER — Encounter (HOSPITAL_COMMUNITY)
Admission: RE | Admit: 2013-11-21 | Discharge: 2013-11-21 | Disposition: A | Discharge status: Home / Self Care | Payer: Medicare Other | Source: Ambulatory Visit | Attending: Nephrology | Admitting: Nephrology

## 2013-11-21 DIAGNOSIS — E119 Type 2 diabetes mellitus without complications: Secondary | ICD-10-CM | POA: Diagnosis not present

## 2013-11-21 DIAGNOSIS — R944 Abnormal results of kidney function studies: Secondary | ICD-10-CM | POA: Diagnosis not present

## 2013-11-21 DIAGNOSIS — I10 Essential (primary) hypertension: Secondary | ICD-10-CM | POA: Insufficient documentation

## 2013-11-21 DIAGNOSIS — R4701 Aphasia: Secondary | ICD-10-CM | POA: Diagnosis not present

## 2013-11-21 DIAGNOSIS — E756 Lipid storage disorder, unspecified: Secondary | ICD-10-CM | POA: Insufficient documentation

## 2013-11-21 DIAGNOSIS — F17200 Nicotine dependence, unspecified, uncomplicated: Secondary | ICD-10-CM | POA: Insufficient documentation

## 2013-11-21 DIAGNOSIS — I4891 Unspecified atrial fibrillation: Secondary | ICD-10-CM | POA: Insufficient documentation

## 2013-11-21 MED ORDER — SODIUM CHLORIDE 0.9 % IV SOLN
INTRAVENOUS | Status: DC
  Administered 2013-11-21: 250 mL via INTRAVENOUS

## 2013-11-21 MED ORDER — SODIUM CHLORIDE 0.9 % IV SOLN
1.0000 mg/kg | INTRAVENOUS | Status: DC
  Administered 2013-11-21: 80 mg via INTRAVENOUS
  Filled 2013-11-21: qty 16

## 2013-11-21 MED ORDER — ACETAMINOPHEN 500 MG PO TABS: 1000.0000 mg | ORAL_TABLET | ORAL | Status: DC

## 2013-11-25 ENCOUNTER — Encounter (HOSPITAL_COMMUNITY): Payer: Medicare Other

## 2013-11-28 ENCOUNTER — Telehealth: Payer: Self-pay

## 2013-11-28 NOTE — Telephone Encounter (Signed)
Patient called for xarelto samples I placed samples up front

## 2013-12-09 ENCOUNTER — Encounter (HOSPITAL_COMMUNITY)
Admission: RE | Admit: 2013-12-09 | Discharge: 2013-12-09 | Disposition: A | Discharge status: Home / Self Care | Payer: Medicare Other | Source: Ambulatory Visit | Attending: Nephrology | Admitting: Nephrology

## 2013-12-09 ENCOUNTER — Encounter (HOSPITAL_COMMUNITY): Payer: Self-pay

## 2013-12-09 DIAGNOSIS — E756 Lipid storage disorder, unspecified: Secondary | ICD-10-CM | POA: Diagnosis not present

## 2013-12-09 LAB — GLUCOSE, CAPILLARY: Glucose-Capillary: 158 mg/dL — ABNORMAL HIGH (ref 70–99)

## 2013-12-09 MED ORDER — SODIUM CHLORIDE 0.9 % IV SOLN
INTRAVENOUS | Status: DC
  Administered 2013-12-09: 250 mL via INTRAVENOUS

## 2013-12-09 MED ORDER — ACETAMINOPHEN 500 MG PO TABS: 1000.0000 mg | ORAL_TABLET | ORAL | Status: DC

## 2013-12-09 MED ORDER — AGALSIDASE BETA 5 MG IV SOLR
1.0000 mg/kg | INTRAVENOUS | Status: DC
  Administered 2013-12-09: 70 mg via INTRAVENOUS
  Filled 2013-12-09: qty 14

## 2013-12-09 NOTE — Progress Notes (Signed)
Pt. Still not very communicative, pt. Stated " I feel rested now", pt's son stated " she's getting ready to move", pt. Ambulated to bathroom with RN via cane, and placed in W/C and taken to car per RN.

## 2013-12-09 NOTE — Progress Notes (Signed)
Pt arrived today. States she just feels weak and washed out. Very non-communicative, Son [name] is here with her. States he has seen no major changes at home but" there is a lot off stress at home" and" just life in general" VSS Pt warma nd dry. Pt took her tylenol and Allegra at home prior to arrival. Pt states she did not sleep well last night and just wants to take a nap Pt and son have upcoming appointment with Dr Abel Presto in 2 weeks. [name]

## 2013-12-10 ENCOUNTER — Encounter (HOSPITAL_COMMUNITY): Payer: Medicare Other

## 2013-12-18 ENCOUNTER — Ambulatory Visit (INDEPENDENT_AMBULATORY_CARE_PROVIDER_SITE_OTHER): Payer: Medicare Other | Admitting: Internal Medicine

## 2013-12-18 ENCOUNTER — Encounter: Payer: Self-pay | Admitting: Internal Medicine

## 2013-12-18 VITALS — BP 120/64 | HR 75 | Ht 60.0 in

## 2013-12-18 DIAGNOSIS — I480 Paroxysmal atrial fibrillation: Secondary | ICD-10-CM

## 2013-12-18 DIAGNOSIS — E7521 Fabry (-Anderson) disease: Secondary | ICD-10-CM

## 2013-12-18 DIAGNOSIS — I639 Cerebral infarction, unspecified: Secondary | ICD-10-CM

## 2013-12-18 LAB — BASIC METABOLIC PANEL
BUN: 20 mg/dL (ref 6–23)
CO2: 29 mEq/L (ref 19–32)
Calcium: 9 mg/dL (ref 8.4–10.5)
Chloride: 107 mEq/L (ref 96–112)
Creatinine, Ser: 1.1 mg/dL (ref 0.4–1.2)
GFR: 61.34 mL/min (ref >60.00)
GLUCOSE: 89 mg/dL (ref 70–99)
Potassium: 3.7 mEq/L (ref 3.5–5.1)
SODIUM: 141 meq/L (ref 135–145)

## 2013-12-18 NOTE — Patient Instructions (Signed)
Your physician recommends that you continue on your current medications as directed. Please refer to the Current Medication list given to you today.  Lab today: BMET  Your physician wants you to follow-up in: 6 months with Dr. Klein. You will receive a reminder letter in the mail two months in advance. If you don't receive a letter, please call our office to schedule the follow-up appointment.  

## 2013-12-18 NOTE — Progress Notes (Signed)
      Patient Care Team: Burtis Junes, MD as PCP - General (Family Medicine)   HPI  Rebecca [name] is a 68 y.o. 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 An event recorder so that we demonstrated atrial fibrillation. She is currently on Rivaroxaban   She's had a vast improvement in her aphasic effects; she is also ambulating quite well  Past Medical History  Diagnosis Date  . Hypertension   . 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 Expressive aphasia 03/23/2013  . Atrial fibrillation 05/01/2013  . Tobacco abuse 05/01/2013  . Diabetes mellitus without complication     Past Surgical History  Procedure Laterality Date  . Tee without cardioversion N/A 03/26/2013    Procedure: TRANSESOPHAGEAL ECHOCARDIOGRAM (TEE);  Surgeon: Thurmon Fair, MD;  Location: [facility] ENDOSCOPY;  Service: Cardiovascular;  Laterality: N/A;  . Cesarean section      x3  . Hip replacement Right 1990's  . Hip replacemet Left 1990's    Current Outpatient Prescriptions  Medication Sig Dispense Refill  . ALPHAGAN P 0.1 % SOLN       . atorvastatin (LIPITOR) 40 MG tablet Take 1 tablet (40 mg total) by mouth daily at 6 PM.  30 tablet  0  . lisinopril (PRINIVIL,ZESTRIL) 5 MG tablet Take 1 tablet (5 mg total) by mouth daily.  30 tablet  0  . metoprolol succinate (TOPROL-XL) 25 MG 24 hr tablet TAKE 1 TABLET THREE TIMES A WEEK  39 tablet  0  . rivaroxaban (XARELTO) 20 MG TABS tablet Take 1 tablet (20 mg total) by mouth daily.  30 tablet  0  . TOPROL XL 25 MG 24 hr tablet       . triamcinolone cream (KENALOG) 0.1 %       . hydrochlorothiazide (HYDRODIURIL) 25 MG tablet Take 1 tablet (25 mg total) by mouth daily.  30 tablet  0   No current facility-administered medications for this visit.    No Known Allergies  Review of Systems negative except from HPI and PMH  Physical Exam BP 120/64  Pulse 75  Ht 5' (1.524 m) Well developed and well nourished in no acute distress HENT  normal E scleral and icterus clear Neck Supple JVP flat; carotids brisk and full Clear to ausculation  Regular rate and rhythm, no murmurs gallops or rub Soft with active bowel sounds No clubbing cyanosis none Edema Alert and oriented, grossly normal motor and sensory function, walking with cane  Speech almost normal Skin Warm and Dry   event recorder was reviewed and demonstrated atrial fibrillation  Assessment and  Plan   Fabrys  Stroke   Atrial fibrillation-paroxysmal  We will continue her on her anticoagulation.  Is a long time discussing her daughter's recent diagnosis of bladder cancer

## 2013-12-24 ENCOUNTER — Encounter (HOSPITAL_COMMUNITY): Payer: Self-pay | Admitting: Emergency Medicine

## 2013-12-24 ENCOUNTER — Emergency Department (HOSPITAL_COMMUNITY)
Admission: EM | Admit: 2013-12-24 | Discharge: 2013-12-24 | Disposition: A | Discharge status: Home / Self Care | Payer: Medicare Other | Attending: Emergency Medicine | Admitting: Emergency Medicine

## 2013-12-24 ENCOUNTER — Emergency Department (HOSPITAL_COMMUNITY): Payer: Medicare Other

## 2013-12-24 DIAGNOSIS — Y92410 Unspecified street and highway as the place of occurrence of the external cause: Secondary | ICD-10-CM | POA: Diagnosis not present

## 2013-12-24 DIAGNOSIS — Z79899 Other long term (current) drug therapy: Secondary | ICD-10-CM | POA: Diagnosis not present

## 2013-12-24 DIAGNOSIS — Y9389 Activity, other specified: Secondary | ICD-10-CM | POA: Insufficient documentation

## 2013-12-24 DIAGNOSIS — Z8673 Personal history of transient ischemic attack (TIA), and cerebral infarction without residual deficits: Secondary | ICD-10-CM | POA: Insufficient documentation

## 2013-12-24 DIAGNOSIS — I4891 Unspecified atrial fibrillation: Secondary | ICD-10-CM | POA: Insufficient documentation

## 2013-12-24 DIAGNOSIS — R4789 Other speech disturbances: Secondary | ICD-10-CM | POA: Diagnosis not present

## 2013-12-24 DIAGNOSIS — Z8639 Personal history of other endocrine, nutritional and metabolic disease: Secondary | ICD-10-CM | POA: Diagnosis not present

## 2013-12-24 DIAGNOSIS — R2689 Other abnormalities of gait and mobility: Secondary | ICD-10-CM | POA: Diagnosis not present

## 2013-12-24 DIAGNOSIS — R4182 Altered mental status, unspecified: Secondary | ICD-10-CM | POA: Diagnosis present

## 2013-12-24 DIAGNOSIS — E119 Type 2 diabetes mellitus without complications: Secondary | ICD-10-CM | POA: Diagnosis not present

## 2013-12-24 DIAGNOSIS — Z7901 Long term (current) use of anticoagulants: Secondary | ICD-10-CM | POA: Diagnosis not present

## 2013-12-24 DIAGNOSIS — Z7952 Long term (current) use of systemic steroids: Secondary | ICD-10-CM | POA: Insufficient documentation

## 2013-12-24 DIAGNOSIS — Z043 Encounter for examination and observation following other accident: Secondary | ICD-10-CM | POA: Diagnosis not present

## 2013-12-24 DIAGNOSIS — I10 Essential (primary) hypertension: Secondary | ICD-10-CM | POA: Insufficient documentation

## 2013-12-24 DIAGNOSIS — Z96643 Presence of artificial hip joint, bilateral: Secondary | ICD-10-CM | POA: Insufficient documentation

## 2013-12-24 LAB — CBC WITH DIFFERENTIAL/PLATELET
BASOS ABS: 0 10*3/uL (ref 0.0–0.1)
Basophils Relative: 0 % (ref 0–1)
Eosinophils Absolute: 0 10*3/uL (ref 0.0–0.7)
Eosinophils Relative: 1 % (ref 0–5)
HCT: 37.4 % (ref 36.0–46.0)
HEMOGLOBIN: 12.3 g/dL (ref 12.0–15.0)
Lymphocytes Relative: 38 % (ref 12–46)
Lymphs Abs: 1 10*3/uL (ref 0.7–4.0)
MCH: 27.2 pg (ref 26.0–34.0)
MCHC: 32.9 g/dL (ref 30.0–36.0)
MCV: 82.7 fL (ref 78.0–100.0)
MONOS PCT: 11 % (ref 3–12)
Monocytes Absolute: 0.3 10*3/uL (ref 0.1–1.0)
NEUTROS ABS: 1.4 10*3/uL — AB (ref 1.7–7.7)
NEUTROS PCT: 50 % (ref 43–77)
Platelets: 160 10*3/uL (ref 150–400)
RBC: 4.52 MIL/uL (ref 3.87–5.11)
RDW: 15.1 % (ref 11.5–15.5)
WBC: 2.7 10*3/uL — ABNORMAL LOW (ref 4.0–10.5)

## 2013-12-24 LAB — COMPREHENSIVE METABOLIC PANEL
ALBUMIN: 3.6 g/dL (ref 3.5–5.2)
ALT: 13 U/L (ref 0–35)
AST: 27 U/L (ref 0–37)
Alkaline Phosphatase: 41 U/L (ref 39–117)
Anion gap: 11 (ref 5–15)
BILIRUBIN TOTAL: 0.4 mg/dL (ref 0.3–1.2)
BUN: 17 mg/dL (ref 6–23)
CHLORIDE: 106 meq/L (ref 96–112)
CO2: 26 mEq/L (ref 19–32)
Calcium: 9.1 mg/dL (ref 8.4–10.5)
Creatinine, Ser: 1.13 mg/dL — ABNORMAL HIGH (ref 0.50–1.10)
GFR calc Af Amer: 56 mL/min — ABNORMAL LOW (ref >90)
GFR calc non Af Amer: 48 mL/min — ABNORMAL LOW (ref >90)
Glucose, Bld: 127 mg/dL — ABNORMAL HIGH (ref 70–99)
Potassium: 3.9 mEq/L (ref 3.7–5.3)
Sodium: 143 mEq/L (ref 137–147)
Total Protein: 7.8 g/dL (ref 6.0–8.3)

## 2013-12-24 LAB — URINE MICROSCOPIC-ADD ON

## 2013-12-24 LAB — URINALYSIS, ROUTINE W REFLEX MICROSCOPIC
BILIRUBIN URINE: NEGATIVE
GLUCOSE, UA: NEGATIVE mg/dL
HGB URINE DIPSTICK: NEGATIVE
KETONES UR: NEGATIVE mg/dL
Leukocytes, UA: NEGATIVE
Nitrite: NEGATIVE
PH: 6 (ref 5.0–8.0)
Protein, ur: 30 mg/dL — AB
Specific Gravity, Urine: 1.027 (ref 1.005–1.030)
Urobilinogen, UA: 1 mg/dL (ref 0.0–1.0)
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
## 2013-12-24 NOTE — ED Notes (Signed)
Pt returned from x-ray, pt remains alert and oriented x's 3.  Family at bedside.

## 2013-12-24 NOTE — ED Notes (Signed)
Pt in x-ray at this time.  Family waiting in room for pt's return.
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
## 2013-12-24 NOTE — ED Notes (Signed)
EKG completed and given to EDP.  
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
## 2013-12-26 ENCOUNTER — Encounter (HOSPITAL_COMMUNITY): Payer: Self-pay

## 2013-12-26 ENCOUNTER — Encounter (HOSPITAL_COMMUNITY)
Admission: RE | Admit: 2013-12-26 | Discharge: 2013-12-26 | Disposition: A | Discharge status: Home / Self Care | Payer: Medicare Other | Source: Ambulatory Visit | Attending: Nephrology | Admitting: Nephrology

## 2013-12-26 DIAGNOSIS — I4891 Unspecified atrial fibrillation: Secondary | ICD-10-CM | POA: Insufficient documentation

## 2013-12-26 DIAGNOSIS — R4701 Aphasia: Secondary | ICD-10-CM | POA: Insufficient documentation

## 2013-12-26 DIAGNOSIS — F17200 Nicotine dependence, unspecified, uncomplicated: Secondary | ICD-10-CM | POA: Diagnosis not present

## 2013-12-26 DIAGNOSIS — E119 Type 2 diabetes mellitus without complications: Secondary | ICD-10-CM | POA: Diagnosis not present

## 2013-12-26 DIAGNOSIS — E779 Disorder of glycoprotein metabolism, unspecified: Secondary | ICD-10-CM | POA: Diagnosis not present

## 2013-12-26 DIAGNOSIS — R944 Abnormal results of kidney function studies: Secondary | ICD-10-CM | POA: Insufficient documentation

## 2013-12-26 DIAGNOSIS — I10 Essential (primary) hypertension: Secondary | ICD-10-CM | POA: Diagnosis not present

## 2013-12-26 MED ORDER — AGALSIDASE BETA 5 MG IV SOLR
1.0000 mg/kg | INTRAVENOUS | Status: DC
  Administered 2013-12-26: 70 mg via INTRAVENOUS
  Filled 2013-12-26: qty 14

## 2013-12-26 MED ORDER — ACETAMINOPHEN 500 MG PO TABS: 1000.0000 mg | ORAL_TABLET | ORAL | Status: DC

## 2013-12-26 MED ORDER — SODIUM CHLORIDE 0.9 % IV SOLN
INTRAVENOUS | Status: DC
  Administered 2013-12-26: 08:00:00 via INTRAVENOUS

## 2014-01-09 ENCOUNTER — Encounter (HOSPITAL_COMMUNITY)
Admission: RE | Admit: 2014-01-09 | Discharge: 2014-01-09 | Disposition: A | Discharge status: Home / Self Care | Payer: Medicare Other | Source: Ambulatory Visit | Attending: Nephrology | Admitting: Nephrology

## 2014-01-09 ENCOUNTER — Encounter (HOSPITAL_COMMUNITY): Payer: Self-pay

## 2014-01-09 ENCOUNTER — Other Ambulatory Visit (HOSPITAL_COMMUNITY): Payer: Self-pay | Admitting: Nephrology

## 2014-01-09 DIAGNOSIS — E779 Disorder of glycoprotein metabolism, unspecified: Secondary | ICD-10-CM | POA: Diagnosis not present

## 2014-01-09 MED ORDER — SODIUM CHLORIDE 0.9 % IV SOLN
1.0000 mg/kg | INTRAVENOUS | Status: DC
  Administered 2014-01-09: 70 mg via INTRAVENOUS
  Filled 2014-01-09: qty 14

## 2014-01-09 MED ORDER — SODIUM CHLORIDE 0.9 % IV SOLN
INTRAVENOUS | Status: DC
  Administered 2014-01-09: 08:00:00 via INTRAVENOUS

## 2014-01-09 MED ORDER — ACETAMINOPHEN 500 MG PO TABS: 500.0000 mg | ORAL_TABLET | ORAL | Status: DC

## 2014-01-23 ENCOUNTER — Encounter (HOSPITAL_COMMUNITY): Admission: RE | Admit: 2014-01-23 | Payer: Medicare Other | Source: Ambulatory Visit

## 2014-02-02 ENCOUNTER — Other Ambulatory Visit: Payer: Self-pay | Admitting: Interventional Cardiology

## 2014-02-06 ENCOUNTER — Encounter (HOSPITAL_COMMUNITY): Payer: Self-pay

## 2014-02-06 ENCOUNTER — Encounter (HOSPITAL_COMMUNITY)
Admission: RE | Admit: 2014-02-06 | Discharge: 2014-02-06 | Disposition: A | Discharge status: Home / Self Care | Payer: Medicare Other | Source: Ambulatory Visit | Attending: Nephrology | Admitting: Nephrology

## 2014-02-06 DIAGNOSIS — E779 Disorder of glycoprotein metabolism, unspecified: Secondary | ICD-10-CM | POA: Diagnosis not present

## 2014-02-06 DIAGNOSIS — I4891 Unspecified atrial fibrillation: Secondary | ICD-10-CM | POA: Insufficient documentation

## 2014-02-06 DIAGNOSIS — R944 Abnormal results of kidney function studies: Secondary | ICD-10-CM | POA: Diagnosis not present

## 2014-02-06 DIAGNOSIS — E119 Type 2 diabetes mellitus without complications: Secondary | ICD-10-CM | POA: Diagnosis not present

## 2014-02-06 DIAGNOSIS — I10 Essential (primary) hypertension: Secondary | ICD-10-CM | POA: Diagnosis not present

## 2014-02-06 DIAGNOSIS — F17200 Nicotine dependence, unspecified, uncomplicated: Secondary | ICD-10-CM | POA: Insufficient documentation

## 2014-02-06 DIAGNOSIS — R4701 Aphasia: Secondary | ICD-10-CM | POA: Diagnosis not present

## 2014-02-06 MED ORDER — ACETAMINOPHEN 500 MG PO TABS: 500.0000 mg | ORAL_TABLET | ORAL | Status: DC

## 2014-02-06 MED ORDER — SODIUM CHLORIDE 0.9 % IV SOLN
INTRAVENOUS | Status: DC
  Administered 2014-02-06: 08:00:00 via INTRAVENOUS

## 2014-02-06 MED ORDER — SODIUM CHLORIDE 0.9 % IV SOLN
1.0000 mg/kg | INTRAVENOUS | Status: DC
  Administered 2014-02-06: 70 mg via INTRAVENOUS
  Filled 2014-02-06: qty 14

## 2014-02-17 ENCOUNTER — Telehealth: Payer: Self-pay | Admitting: *Deleted

## 2014-02-17 NOTE — Telephone Encounter (Signed)
Tried to reach patient on 2 other occassions, left messages (11/17, 11/24). Received office visit from [facility] stating pt had not been on Xarelto for about a month. Spoke with patient who states she is currently taking this medication. Stressed importance of taking this medication.  Patient verbalized understanding.

## 2014-02-19 ENCOUNTER — Ambulatory Visit (INDEPENDENT_AMBULATORY_CARE_PROVIDER_SITE_OTHER): Payer: Medicare Other | Admitting: Psychiatry

## 2014-02-19 ENCOUNTER — Encounter (HOSPITAL_COMMUNITY): Payer: Self-pay | Admitting: Psychiatry

## 2014-02-19 VITALS — BP 122/75 | HR 84 | Ht 61.0 in | Wt 152.0 lb

## 2014-02-19 DIAGNOSIS — F0630 Mood disorder due to known physiological condition, unspecified: Secondary | ICD-10-CM

## 2014-02-19 DIAGNOSIS — F331 Major depressive disorder, recurrent, moderate: Secondary | ICD-10-CM

## 2014-02-19 MED ORDER — ESCITALOPRAM OXALATE 5 MG PO TABS: 5.0000 mg | ORAL_TABLET | Freq: Every day | ORAL | Status: DC

## 2014-02-19 MED ORDER — ESCITALOPRAM OXALATE 5 MG PO TABS: 10.0000 mg | ORAL_TABLET | Freq: Every day | ORAL | Status: DC
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
## 2014-02-20 ENCOUNTER — Encounter (HOSPITAL_COMMUNITY)
Admission: RE | Admit: 2014-02-20 | Discharge: 2014-02-20 | Disposition: A | Discharge status: Home / Self Care | Payer: Medicare Other | Source: Ambulatory Visit | Attending: Nephrology | Admitting: Nephrology

## 2014-02-20 ENCOUNTER — Encounter (HOSPITAL_COMMUNITY): Payer: Self-pay

## 2014-02-20 DIAGNOSIS — F17200 Nicotine dependence, unspecified, uncomplicated: Secondary | ICD-10-CM | POA: Diagnosis not present

## 2014-02-20 DIAGNOSIS — I4891 Unspecified atrial fibrillation: Secondary | ICD-10-CM | POA: Diagnosis not present

## 2014-02-20 DIAGNOSIS — E119 Type 2 diabetes mellitus without complications: Secondary | ICD-10-CM | POA: Diagnosis not present

## 2014-02-20 DIAGNOSIS — I10 Essential (primary) hypertension: Secondary | ICD-10-CM | POA: Diagnosis not present

## 2014-02-20 DIAGNOSIS — R944 Abnormal results of kidney function studies: Secondary | ICD-10-CM | POA: Insufficient documentation

## 2014-02-20 DIAGNOSIS — R4701 Aphasia: Secondary | ICD-10-CM | POA: Diagnosis not present

## 2014-02-20 DIAGNOSIS — E779 Disorder of glycoprotein metabolism, unspecified: Secondary | ICD-10-CM | POA: Insufficient documentation

## 2014-02-20 MED ORDER — ACETAMINOPHEN 500 MG PO TABS
500.0000 mg | ORAL_TABLET | ORAL | Status: DC
Start: 2014-02-20 — End: 2014-02-21

## 2014-02-20 MED ORDER — SODIUM CHLORIDE 0.9 % IV SOLN
INTRAVENOUS | Status: DC
  Administered 2014-02-20: 08:00:00 via INTRAVENOUS

## 2014-02-20 MED ORDER — SODIUM CHLORIDE 0.9 % IV SOLN
1.0000 mg/kg | INTRAVENOUS | Status: DC
  Administered 2014-02-20: 70 mg via INTRAVENOUS
  Filled 2014-02-20: qty 14

## 2014-02-20 NOTE — Discharge Instructions (Signed)
Agalsidase Beta injection °What is this medicine? °AGALSIDASE BETA is used to replace an enzyme that is missing in patients with Fabry disease. It is not a cure. °This medicine may be used for other purposes; ask your health care provider or pharmacist if you have questions. °COMMON BRAND NAME(S): Fabrazyme °What should I tell my health care provider before I take this medicine? °They need to know if you have any of these conditions: °-heart disease °-an unusual or allergic reaction to agalsidase beta, mannitol, other medicines, foods, dyes, or preservatives °-pregnant or trying to get pregnant °-breast-feeding °How should I use this medicine? °This medicine is for infusion into a vein. It is given by a health care professional in a hospital or clinic setting. °Talk to your pediatrician regarding the use of this medicine in children. Special care may be needed. °Overdosage: If you think you have taken too much of this medicine contact a poison control center or emergency room at once. °NOTE: This medicine is only for you. Do not share this medicine with others. °What if I miss a dose? °It is important not to miss your dose. Call your doctor or health care professional if you are unable to keep an appointment. °What may interact with this medicine? °-amiodarone °-chloroquine °-gentamicin °-hydroxychloroquine °-monobenzone °This list may not describe all possible interactions. Give your health care provider a list of all the medicines, herbs, non-prescription drugs, or dietary supplements you use. Also tell them if you smoke, drink alcohol, or use illegal drugs. Some items may interact with your medicine. °What should I watch for while using this medicine? °Visit your doctor or health care professional for regular checks on your progress. Tell your doctor or healthcare professional if your symptoms do not start to get better or if they get worse. °There is a registry for patients with Fabry disease. The registry is  used to gather information about the disease and its effects. Talk to your health care provider if you would like to join the registry. °What side effects may I notice from receiving this medicine? °Side effects that you should report to your doctor or health care professional as soon as possible: °-allergic reactions like skin rash, itching or hives, swelling of the face, lips, or tongue °-breathing problems °-chest pain, tightness °-depression °-dizziness °-fast, irregular heart beat °-swelling of the arms or legs °Side effects that usually do not require medical attention (report to your doctor or health care professional if they continue or are bothersome): °-aches or pains °-anxiety °-fever or chills at the time of injection °-headache °-nausea, vomiting °-stomach pain, upset °This list may not describe all possible side effects. Call your doctor for medical advice about side effects. You may report side effects to FDA at 1-800-FDA-1088. °Where should I keep my medicine? °This drug is given in a hospital or clinic and will not be stored at home. °NOTE: This sheet is a summary. It may not cover all possible information. If you have questions about this medicine, talk to your doctor, pharmacist, or health care provider. °© 2015, [employer]vier/[employer]. (2005-11-13 12:25:00) ° °

## 2014-03-03 ENCOUNTER — Other Ambulatory Visit: Payer: Self-pay | Admitting: *Deleted

## 2014-03-03 MED ORDER — RIVAROXABAN 20 MG PO TABS: 20.0000 mg | ORAL_TABLET | Freq: Every day | ORAL | Status: DC

## 2014-03-06 ENCOUNTER — Encounter (HOSPITAL_COMMUNITY)
Admission: RE | Admit: 2014-03-06 | Discharge: 2014-03-06 | Disposition: A | Discharge status: Home / Self Care | Payer: Medicare Other | Source: Ambulatory Visit | Attending: Nephrology | Admitting: Nephrology

## 2014-03-06 ENCOUNTER — Encounter (HOSPITAL_COMMUNITY): Payer: Self-pay

## 2014-03-06 DIAGNOSIS — E779 Disorder of glycoprotein metabolism, unspecified: Secondary | ICD-10-CM | POA: Diagnosis not present

## 2014-03-06 MED ORDER — SODIUM CHLORIDE 0.9 % IV SOLN
INTRAVENOUS | Status: DC
  Administered 2014-03-06: 250 mL via INTRAVENOUS

## 2014-03-06 MED ORDER — ACETAMINOPHEN 500 MG PO TABS
500.0000 mg | ORAL_TABLET | ORAL | Status: DC
Start: 2014-03-06 — End: 2014-03-07

## 2014-03-06 MED ORDER — SODIUM CHLORIDE 0.9 % IV SOLN
1.0000 mg/kg | INTRAVENOUS | Status: DC
  Administered 2014-03-06: 70 mg via INTRAVENOUS
  Filled 2014-03-06: qty 14

## 2014-03-10 ENCOUNTER — Telehealth: Payer: Self-pay | Admitting: *Deleted

## 2014-03-10 NOTE — Telephone Encounter (Signed)
Xarelto samples placed at the front desk for patient. 

## 2014-03-20 DIAGNOSIS — I639 Cerebral infarction, unspecified: Secondary | ICD-10-CM

## 2014-03-20 HISTORY — DX: Cerebral infarction, unspecified: I63.9

## 2014-03-24 ENCOUNTER — Encounter (HOSPITAL_COMMUNITY): Payer: Self-pay

## 2014-03-24 ENCOUNTER — Encounter (HOSPITAL_COMMUNITY)
Admission: RE | Admit: 2014-03-24 | Discharge: 2014-03-24 | Disposition: A | Discharge status: Home / Self Care | Payer: Medicare Other | Source: Ambulatory Visit | Attending: Nephrology | Admitting: Nephrology

## 2014-03-24 DIAGNOSIS — R4701 Aphasia: Secondary | ICD-10-CM | POA: Diagnosis not present

## 2014-03-24 DIAGNOSIS — F17200 Nicotine dependence, unspecified, uncomplicated: Secondary | ICD-10-CM | POA: Insufficient documentation

## 2014-03-24 DIAGNOSIS — R944 Abnormal results of kidney function studies: Secondary | ICD-10-CM | POA: Diagnosis not present

## 2014-03-24 DIAGNOSIS — E779 Disorder of glycoprotein metabolism, unspecified: Secondary | ICD-10-CM | POA: Diagnosis not present

## 2014-03-24 DIAGNOSIS — I4891 Unspecified atrial fibrillation: Secondary | ICD-10-CM | POA: Diagnosis not present

## 2014-03-24 DIAGNOSIS — E119 Type 2 diabetes mellitus without complications: Secondary | ICD-10-CM | POA: Insufficient documentation

## 2014-03-24 DIAGNOSIS — I10 Essential (primary) hypertension: Secondary | ICD-10-CM | POA: Diagnosis not present

## 2014-03-24 MED ORDER — ACETAMINOPHEN 500 MG PO TABS: 500.0000 mg | ORAL_TABLET | ORAL | Status: DC

## 2014-03-24 MED ORDER — SODIUM CHLORIDE 0.9 % IV SOLN
INTRAVENOUS | Status: DC
  Administered 2014-03-24: 08:00:00 via INTRAVENOUS

## 2014-03-24 MED ORDER — SODIUM CHLORIDE 0.9 % IV SOLN
1.0000 mg/kg | INTRAVENOUS | Status: DC
  Administered 2014-03-24: 70 mg via INTRAVENOUS
  Filled 2014-03-24: qty 14

## 2014-04-01 ENCOUNTER — Emergency Department (HOSPITAL_COMMUNITY): Payer: Medicare Other

## 2014-04-01 ENCOUNTER — Inpatient Hospital Stay (HOSPITAL_COMMUNITY)
Admission: EM | Admit: 2014-04-01 | Discharge: 2014-04-07 | DRG: 064 | Disposition: A | Discharge status: Rehabilitation Facility | Payer: Medicare Other | Attending: Internal Medicine | Admitting: Internal Medicine

## 2014-04-01 ENCOUNTER — Encounter (HOSPITAL_COMMUNITY): Payer: Self-pay

## 2014-04-01 DIAGNOSIS — R4701 Aphasia: Secondary | ICD-10-CM | POA: Diagnosis present

## 2014-04-01 DIAGNOSIS — I472 Ventricular tachycardia: Secondary | ICD-10-CM | POA: Diagnosis not present

## 2014-04-01 DIAGNOSIS — I69351 Hemiplegia and hemiparesis following cerebral infarction affecting right dominant side: Secondary | ICD-10-CM | POA: Diagnosis not present

## 2014-04-01 DIAGNOSIS — N390 Urinary tract infection, site not specified: Secondary | ICD-10-CM | POA: Diagnosis not present

## 2014-04-01 DIAGNOSIS — E46 Unspecified protein-calorie malnutrition: Secondary | ICD-10-CM | POA: Diagnosis not present

## 2014-04-01 DIAGNOSIS — R7989 Other specified abnormal findings of blood chemistry: Secondary | ICD-10-CM

## 2014-04-01 DIAGNOSIS — I272 Other secondary pulmonary hypertension: Secondary | ICD-10-CM | POA: Diagnosis present

## 2014-04-01 DIAGNOSIS — I429 Cardiomyopathy, unspecified: Secondary | ICD-10-CM | POA: Diagnosis present

## 2014-04-01 DIAGNOSIS — I5043 Acute on chronic combined systolic (congestive) and diastolic (congestive) heart failure: Secondary | ICD-10-CM | POA: Diagnosis present

## 2014-04-01 DIAGNOSIS — Z96649 Presence of unspecified artificial hip joint: Secondary | ICD-10-CM | POA: Diagnosis present

## 2014-04-01 DIAGNOSIS — I4891 Unspecified atrial fibrillation: Secondary | ICD-10-CM | POA: Diagnosis present

## 2014-04-01 DIAGNOSIS — E1142 Type 2 diabetes mellitus with diabetic polyneuropathy: Secondary | ICD-10-CM | POA: Diagnosis present

## 2014-04-01 DIAGNOSIS — Z7901 Long term (current) use of anticoagulants: Secondary | ICD-10-CM

## 2014-04-01 DIAGNOSIS — I63412 Cerebral infarction due to embolism of left middle cerebral artery: Secondary | ICD-10-CM | POA: Diagnosis present

## 2014-04-01 DIAGNOSIS — I69320 Aphasia following cerebral infarction: Secondary | ICD-10-CM | POA: Diagnosis not present

## 2014-04-01 DIAGNOSIS — I480 Paroxysmal atrial fibrillation: Secondary | ICD-10-CM | POA: Diagnosis present

## 2014-04-01 DIAGNOSIS — E119 Type 2 diabetes mellitus without complications: Secondary | ICD-10-CM

## 2014-04-01 DIAGNOSIS — I5031 Acute diastolic (congestive) heart failure: Secondary | ICD-10-CM | POA: Diagnosis present

## 2014-04-01 DIAGNOSIS — E785 Hyperlipidemia, unspecified: Secondary | ICD-10-CM | POA: Diagnosis present

## 2014-04-01 DIAGNOSIS — Z4659 Encounter for fitting and adjustment of other gastrointestinal appliance and device: Secondary | ICD-10-CM

## 2014-04-01 DIAGNOSIS — Z8673 Personal history of transient ischemic attack (TIA), and cerebral infarction without residual deficits: Secondary | ICD-10-CM

## 2014-04-01 DIAGNOSIS — I248 Other forms of acute ischemic heart disease: Secondary | ICD-10-CM | POA: Diagnosis present

## 2014-04-01 DIAGNOSIS — I482 Chronic atrial fibrillation, unspecified: Secondary | ICD-10-CM | POA: Diagnosis present

## 2014-04-01 DIAGNOSIS — I639 Cerebral infarction, unspecified: Secondary | ICD-10-CM | POA: Diagnosis present

## 2014-04-01 DIAGNOSIS — E43 Unspecified severe protein-calorie malnutrition: Secondary | ICD-10-CM | POA: Diagnosis present

## 2014-04-01 DIAGNOSIS — I10 Essential (primary) hypertension: Secondary | ICD-10-CM | POA: Diagnosis present

## 2014-04-01 DIAGNOSIS — R0682 Tachypnea, not elsewhere classified: Secondary | ICD-10-CM | POA: Diagnosis present

## 2014-04-01 DIAGNOSIS — Z720 Tobacco use: Secondary | ICD-10-CM | POA: Diagnosis present

## 2014-04-01 DIAGNOSIS — I5033 Acute on chronic diastolic (congestive) heart failure: Secondary | ICD-10-CM

## 2014-04-01 DIAGNOSIS — E7521 Fabry (-Anderson) disease: Secondary | ICD-10-CM | POA: Diagnosis present

## 2014-04-01 DIAGNOSIS — I481 Persistent atrial fibrillation: Secondary | ICD-10-CM

## 2014-04-01 DIAGNOSIS — R531 Weakness: Secondary | ICD-10-CM

## 2014-04-01 DIAGNOSIS — F329 Major depressive disorder, single episode, unspecified: Secondary | ICD-10-CM | POA: Diagnosis present

## 2014-04-01 DIAGNOSIS — R778 Other specified abnormalities of plasma proteins: Secondary | ICD-10-CM | POA: Diagnosis present

## 2014-04-01 HISTORY — DX: Cerebral infarction, unspecified: I63.9

## 2014-04-01 LAB — COMPREHENSIVE METABOLIC PANEL
ALK PHOS: 36 U/L — AB (ref 39–117)
ALT: 15 U/L (ref 0–35)
AST: 38 U/L — ABNORMAL HIGH (ref 0–37)
Albumin: 3.9 g/dL (ref 3.5–5.2)
Anion gap: 19 — ABNORMAL HIGH (ref 5–15)
BUN: 21 mg/dL (ref 6–23)
CO2: 19 mmol/L (ref 19–32)
Calcium: 9.6 mg/dL (ref 8.4–10.5)
Chloride: 105 mEq/L (ref 96–112)
Creatinine, Ser: 1.04 mg/dL (ref 0.50–1.10)
GFR calc Af Amer: 62 mL/min — ABNORMAL LOW (ref >90)
GFR calc non Af Amer: 54 mL/min — ABNORMAL LOW (ref >90)
Glucose, Bld: 130 mg/dL — ABNORMAL HIGH (ref 70–99)
Potassium: 4.3 mmol/L (ref 3.5–5.1)
SODIUM: 143 mmol/L (ref 135–145)
TOTAL PROTEIN: 7.5 g/dL (ref 6.0–8.3)
Total Bilirubin: 0.9 mg/dL (ref 0.3–1.2)

## 2014-04-01 LAB — CBC WITH DIFFERENTIAL/PLATELET
BASOS PCT: 0 % (ref 0–1)
Basophils Absolute: 0 10*3/uL (ref 0.0–0.1)
EOS ABS: 0 10*3/uL (ref 0.0–0.7)
EOS PCT: 0 % (ref 0–5)
HCT: 38.6 % (ref 36.0–46.0)
HEMOGLOBIN: 12.7 g/dL (ref 12.0–15.0)
Lymphocytes Relative: 28 % (ref 12–46)
Lymphs Abs: 1.5 10*3/uL (ref 0.7–4.0)
MCH: 27.6 pg (ref 26.0–34.0)
MCHC: 32.9 g/dL (ref 30.0–36.0)
MCV: 83.9 fL (ref 78.0–100.0)
MONO ABS: 0.4 10*3/uL (ref 0.1–1.0)
MONOS PCT: 7 % (ref 3–12)
NEUTROS PCT: 65 % (ref 43–77)
Neutro Abs: 3.4 10*3/uL (ref 1.7–7.7)
Platelets: 181 10*3/uL (ref 150–400)
RBC: 4.6 MIL/uL (ref 3.87–5.11)
RDW: 15.6 % — ABNORMAL HIGH (ref 11.5–15.5)
WBC: 5.4 10*3/uL (ref 4.0–10.5)

## 2014-04-01 LAB — URINALYSIS, ROUTINE W REFLEX MICROSCOPIC
Bilirubin Urine: NEGATIVE
GLUCOSE, UA: NEGATIVE mg/dL
Hgb urine dipstick: NEGATIVE
KETONES UR: NEGATIVE mg/dL
LEUKOCYTES UA: NEGATIVE
Nitrite: NEGATIVE
PROTEIN: 100 mg/dL — AB
Specific Gravity, Urine: 1.021 (ref 1.005–1.030)
Urobilinogen, UA: 0.2 mg/dL (ref 0.0–1.0)
pH: 5 (ref 5.0–8.0)

## 2014-04-01 LAB — URINE MICROSCOPIC-ADD ON

## 2014-04-01 LAB — ETHANOL: Alcohol, Ethyl (B): <5 mg/dL (ref 0–9)

## 2014-04-01 LAB — TROPONIN I: Troponin I: 0.25 ng/mL — ABNORMAL HIGH (ref <0.031)

## 2014-04-01 MED ORDER — POTASSIUM CHLORIDE CRYS ER 20 MEQ PO TBCR: 20.0000 meq | EXTENDED_RELEASE_TABLET | Freq: Once | ORAL | Status: DC

## 2014-04-01 MED ORDER — POTASSIUM CHLORIDE 10 MEQ/100ML IV SOLN
10.0000 meq | INTRAVENOUS | Status: AC
  Administered 2014-04-01: 10 meq via INTRAVENOUS
  Filled 2014-04-01: qty 100

## 2014-04-01 MED ORDER — FUROSEMIDE 10 MG/ML IJ SOLN
40.0000 mg | Freq: Two times a day (BID) | INTRAMUSCULAR | Status: DC
Start: 2014-04-01 — End: 2014-04-02
  Administered 2014-04-01 – 2014-04-02 (×2): 40 mg via INTRAVENOUS
  Filled 2014-04-01 (×4): qty 4

## 2014-04-01 NOTE — ED Notes (Signed)
Respiratory at bedside. Pt placed on bi-pap 

## 2014-04-01 NOTE — ED Notes (Signed)
MD at bedside. 

## 2014-04-01 NOTE — ED Notes (Addendum)
Per [facility]: Pts last seen normal around 0830 today when pts son left for dialysis. Pts son came home around 1600 and found the pt leaning on the kitchen cabinets. The son said that it wasn't like she was standing, but it wasn't like she was falling. The son helped the pt to the sofa in the living room. The pts other son noted that the pts right side "wasn't working like it should". Stroke scale positive due to right sided deficit. Family said that the pt was "normal, walking, and talking at 0830 this morning". Pt has not been verbal. Pt had slight expiratory wheeze. [facility] started albuterol, 10 mg, atrovent 0.5 mg. Pupils constricted. [facility] gave 125 mg of solumedrol and 50 cc of normal saline.   Pt had a stroke about 1 year ago and "wasn't right since then" "didn't act right for a little bit afterwards".

## 2014-04-01 NOTE — ED Notes (Signed)
Admitting MD at bedside.

## 2014-04-01 NOTE — ED Notes (Signed)
In-Out cath performed by RN Vance Peper. 2nd by EMT Apolinar Junes.

## 2014-04-01 NOTE — ED Notes (Signed)
Admitting MD requesting foley to be placed due to pt's altered mental status, unresponsiveness and IV diuresis.

## 2014-04-01 NOTE — ED Notes (Signed)
Respiratory at bedside.

## 2014-04-01 NOTE — Consult Note (Signed)
CARDIOLOGY CONSULT NOTE  Patient ID: Rebecca [name] MRN: [id number] DOB/AGE: 04/23/1945 69 y.o.  Admit date: 04/01/2014 Primary Cardiologist: Dr. 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  Patient had CVA with aphasia (resolved) in 1/15. She had a loop recorder that documented paroxysmal atrial fibrillation so was started on Xarelto.  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  At baseline, she walked with a cane and had mild residual aphasia.    She was admitted tonight with recurrent CVA.  She is aphasic (not able to communicate) and weak, R>L.  CT showed subacute left frontotemporal infarct.  Patient also was tachypneic with oxygen saturation in 80s on room air, now on Bipap.  Troponin was mildly elevated at 0.25, so cardiology was consulted.    Review of systems complete and found to be negative unless listed above in HPI  Past Medical History: 1. Fabry's disease: Gets Fabrozyme infusions regularly. 2. 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  Cardiac MRI 1/15 with EF 46%, moderate LVH, normal RV, extensive mid-wall LGE pattern concerning for Fabry's involvement. TEE (1/15) with EF 60-65%, severe LVH.  3. Atrial fibrillation: Paroxysmal with history of CVA.  4. HTN 5. Type II diabetes 6. 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  7. Smoker  Family History  Problem Relation Age of Onset  . Alcohol abuse Maternal Aunt     History   Social History  . Marital Status: Single    Spouse Name: N/A    Number of Children: N/A  . Years of Education: N/A   Occupational History  . Not on file.   Social History Main Topics  . Smoking status: Never Smoker   . Smokeless tobacco: Not on file  . Alcohol Use:  No  . Drug Use: No  . Sexual Activity: Not Currently   Other Topics Concern  . Not on file   Social History Narrative    Current Facility-Administered Medications  Medication Dose Route Frequency Provider Last Rate Last Dose  . furosemide (LASIX) injection 40 mg  40 mg Intravenous BID Laurey Morale, MD      . potassium chloride SA (K-DUR,KLOR-CON) CR tablet 20 mEq  20 mEq Oral Once Laurey Morale, MD       Current Outpatient Prescriptions  Medication Sig Dispense Refill  . acetaminophen (TYLENOL) 500 MG tablet Take 500 mg by mouth every 6 (six) hours as needed for moderate pain.    [name] agalsidase beta 1 mg/kg in sodium chloride 0.9 % Inject 1 mg/kg into the vein every 14 (fourteen) days.    [name] aspirin EC 81 MG tablet Take 81 mg by mouth daily.    [name] atorvastatin (LIPITOR) 40 MG tablet Take 1 tablet (40 mg total) by mouth daily at 6 PM. 30 tablet 0  . carboxymethylcellulose 1 % ophthalmic solution Apply 1 drop to eye as needed (for dry eyes).    . fexofenadine (ALLEGRA) 180 MG tablet Take 180 mg by mouth daily.    . Ibuprofen-Diphenhydramine Cit 200-38 MG TABS Take 1 tablet by mouth at bedtime as needed (for sleep).    [name] lisinopril (PRINIVIL,ZESTRIL) 5 MG tablet Take 1 tablet (5 mg total) by mouth daily. 30 tablet 0  . lisinopril (PRINIVIL,ZESTRIL) 5 MG tablet Take 5 mg by mouth daily.    [name]  metoprolol succinate (TOPROL-XL) 25 MG 24 hr tablet TAKE 1 TABLET THREE TIMES A WEEK 39 tablet 6  . Multiple Vitamin (MULTIVITAMIN WITH MINERALS) TABS tablet Take 1 tablet by mouth daily.    . rivaroxaban (XARELTO) 20 MG TABS tablet Take 1 tablet (20 mg total) by mouth daily. 90 tablet 0  . escitalopram (LEXAPRO) 5 MG tablet Take 1 tablet (5 mg total) by mouth daily. 30 tablet 1  . hydrochlorothiazide (HYDRODIURIL) 25 MG tablet Take 1 tablet (25 mg total) by mouth daily. 30 tablet 0    Physical exam Blood pressure 137/83, pulse 68, temperature 98.5 F (36.9 C), temperature source Rectal, resp. rate  17, SpO2 100 %. General: On Bipap, mildly tachypneic Neck: JVP 10+ cm, no thyromegaly or thyroid nodule.  Lungs: Crackles at bases.  CV: Nondisplaced PMI.  Heart regular S1/S2, no S3/S4, no murmur.  1+ edema 1/2 to knees bilaterally.  No carotid bruit.  Normal pedal pulses.  Abdomen: Soft, nontender, no hepatosplenomegaly, no distention.  Skin: Intact without lesions or rashes.  Neurologic: Awake, unable to answer questions, weak right side.  Extremities: No clubbing or cyanosis.  HEENT: Normal.   Labs:   Lab Results  Component Value Date   WBC 5.4 04/01/2014   HGB 12.7 04/01/2014   HCT 38.6 04/01/2014   MCV 83.9 04/01/2014   PLT 181 04/01/2014    Recent Labs Lab 04/01/14 1745  NA 143  K 4.3  CL 105  CO2 19  BUN 21  CREATININE 1.04  CALCIUM 9.6  PROT 7.5  BILITOT 0.9  ALKPHOS 36*  ALT 15  AST 38*  GLUCOSE 130*   Lab Results  Component Value Date   TROPONINI 0.25* 04/01/2014     Radiology: - CXR: Cardiomegaly, vascular congestion - CT head: Old CVA also subacute left frontotemporal infarction  EKG: NSR, PVC, LAFB, LVH, anteroseptal Qs (similar to prior ECG)  ASSESSMENT AND PLAN: 69 yo with 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 1. CVA: Per neurology.  She has a history of paroxysmal atrial fibrillation and prior CVA.  She has been on Xarelto, but unable to tell me whether she has been taking it compliantly.  If she has been taking it regularly, would consider eventual transition to Eliquis 5 mg bid.  She does not have bleeding on her CT, so likely can continue her anticoagulation.  2. Atrial fibrillation: Paroxysmal.  Currently in NSR.  As above, if she was compliant with Xarelto, would consider eventual transition to Eliquis.  3. 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  She is requiring Bipap due to hypoxia.  She has JVD and peripheral edema, so suspect a component of CHF.  - Start Lasix 40 mg IV bid, 1st dose now.   - Check BNP, follow BMET.  4. Elevated troponin: Mild elevation at 0.25.  This may be demand ischemia with acute on chronic diastolic CHF and hypoxia, may also be related to CVA (can see mild rise in troponin with CVA in absence of plaque rupture).  ECG is abnormal but not changed from prior.  Cannot tell me whether or not she has chest pain.  Of note, Fabry's does increase the risk of both epicardial and small vessel CAD.  - Cycle troponin to peak.  -  Would maintain her on a statin.  - She is on Xarelto.   Marca Ancona 04/01/2014 10:25 PM   Signed: @ 04/01/2014, 10:10 PM Co-Sign MD

## 2014-04-01 NOTE — ED Notes (Signed)
Pt remains on BiPap.  No change in mental status

## 2014-04-01 NOTE — ED Notes (Signed)
Pt to CT at this time.

## 2014-04-01 NOTE — ED Notes (Signed)
Lab at bedside

## 2014-04-01 NOTE — ED Notes (Signed)
Pharmacy at bedside

## 2014-04-01 NOTE — ED Notes (Signed)
Foley Catheter inserted. Witnessed by [employer].

## 2014-04-01 NOTE — H&P (Signed)
[facility] History and Physical  MARCIE SHEARON WGN:[id number] DOB: 1945/07/18 DOA: 04/01/2014  Referring physician: ED physician PCP: Burtis Junes, MD  Specialists:   Chief Complaint: aphasia  HPI: Rebecca [name] is a 69 y.o. female with a past medical history of hypertension, atrial fibrillation on Xarelto, Fabry disease, hx of tobacco abuse, diabetes mellitus, stroke, depression, who presents with aphasia.  Patient was recently hospitalized because of stroke. He was discharged on Xeralto for Afib. At that time she was noted to have expressive aphasia and mild weakness on the right side. Patient was brought to hospital today after her son found patient leaning on the counter "slumped over and not communicating and was shivering in all 4 extremities". [place] was found to have oxygen desaturation to 88% on room air, which improved to 100% after placed on BIPAP. When I evaluated the patient in ED, she was awake, but is nonverbal. She only followed some simple commands. It is difficult to assess whether patient has any pain.   Work up in the ED demonstrates CT-head no bleeding. Chest x-rays negative.No leukocytosis. His EKG showed PVC with T-wave inversion in lead 1. Trop is elevated at 0.25. Patient is admitted to inpatient for further evaluation and treatment. Neurology and cardiology were consulted by ED.  Review of Systems: As presented in the history of presenting illness, rest negative.  Where does patient live?  At home Can patient participate in ADLs? none  Allergy: No Known Allergies  Past Medical History  Diagnosis Date  . Hypertension   . 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 Expressive aphasia 03/23/2013  . Atrial fibrillation 05/01/2013  . Tobacco abuse 05/01/2013  . Diabetes mellitus without complication   . Depression   . Stroke     2015    Past Surgical History  Procedure Laterality Date  . Tee without cardioversion N/A 03/26/2013    Procedure:  TRANSESOPHAGEAL ECHOCARDIOGRAM (TEE);  Surgeon: Thurmon Fair, MD;  Location: [facility] ENDOSCOPY;  Service: Cardiovascular;  Laterality: N/A;  . Cesarean section      x3  . Hip replacement Right 1990's  . Hip replacemet Left 1990's    Social History:  reports that she has never smoked. She does not have any smokeless tobacco history on file. She reports that she does not drink alcohol or use illicit drugs.  Family History:  Family History  Problem Relation Age of Onset  . Alcohol abuse Maternal Aunt      Prior to Admission medications   Medication Sig Start Date End Date Taking? Authorizing Provider  acetaminophen (TYLENOL) 500 MG tablet Take 500 mg by mouth every 6 (six) hours as needed for moderate pain.   Yes Historical Provider, MD  agalsidase beta 1 mg/kg in sodium chloride 0.9 % Inject 1 mg/kg into the vein every 14 (fourteen) days.   Yes Historical Provider, MD  aspirin EC 81 MG tablet Take 81 mg by mouth daily.   Yes Historical Provider, MD  atorvastatin (LIPITOR) 40 MG tablet Take 1 tablet (40 mg total) by mouth daily at 6 PM. 03/27/13  Yes Myra Rude, MD  carboxymethylcellulose 1 % ophthalmic solution Apply 1 drop to eye as needed (for dry eyes).   Yes Historical Provider, MD  fexofenadine (ALLEGRA) 180 MG tablet Take 180 mg by mouth daily.   Yes Historical Provider, MD  Ibuprofen-Diphenhydramine Cit 200-38 MG TABS Take 1 tablet by mouth at bedtime as needed (for sleep).   Yes Historical Provider, MD  lisinopril (PRINIVIL,ZESTRIL) 5 MG  tablet Take 1 tablet (5 mg total) by mouth daily. 03/27/13  Yes Myra Rude, MD  lisinopril (PRINIVIL,ZESTRIL) 5 MG tablet Take 5 mg by mouth daily.   Yes Historical Provider, MD  metoprolol succinate (TOPROL-XL) 25 MG 24 hr tablet TAKE 1 TABLET THREE TIMES A WEEK 02/03/14  Yes Lyn Records III, MD  Multiple Vitamin (MULTIVITAMIN WITH MINERALS) TABS tablet Take 1 tablet by mouth daily.   Yes Historical Provider, MD  rivaroxaban (XARELTO) 20 MG  TABS tablet Take 1 tablet (20 mg total) by mouth daily. 03/03/14  Yes Duke Salvia, MD  escitalopram (LEXAPRO) 5 MG tablet Take 1 tablet (5 mg total) by mouth daily. 02/19/14   Thresa Ross, MD  hydrochlorothiazide (HYDRODIURIL) 25 MG tablet Take 1 tablet (25 mg total) by mouth daily. 03/27/13   Myra Rude, MD    Physical Exam: Filed Vitals:   04/01/14 2215 04/01/14 2230 04/01/14 2245 04/01/14 2300  BP: 155/100 155/113 139/92 156/94  Pulse: 74  82 74  Temp:      TempSrc:      Resp: 21 17 19 13   SpO2: 100%  100% 100%   General: Alert, on Bipap, follows only simple commands.  Non vocal.   Cranial Nerves:  HEENT:       Eyes: PERRL, EOMI, no scleral icterus       ENT: No discharge from the ears and nose, no pharynx injection, no tonsillar enlargement.        Neck: No JVD, no bruit, no mass felt. Cardiac: S1/S2, RRR, No murmurs, No gallops or rubs Pulm: Good air movement bilaterally. Clear to auscultation bilaterally. No rales, wheezing, rhonchi or rubs. Abd: Soft, nondistended, nontender, no rebound pain, no organomegaly, BS present Ext: trace leg edema bilaterally. 2+DP/PT pulse bilaterally Musculoskeletal: No joint deformities, erythema, or stiffness, ROM full Skin: No rashes.  Neuro: alert, Not oriented X3, Able to mover left arm antigravity, right arm falls to bed.  Withdraws bilateral legs from noxious stimuli. Brachial reflex 2+ bilaterally. Knee reflex 1+ bilaterally. Negative Babinski's sign.  Psych: could not be assessed   Labs on Admission:  Basic Metabolic Panel:  Recent Labs Lab 04/01/14 1745  NA 143  K 4.3  CL 105  CO2 19  GLUCOSE 130*  BUN 21  CREATININE 1.04  CALCIUM 9.6   Liver Function Tests:  Recent Labs Lab 04/01/14 1745  AST 38*  ALT 15  ALKPHOS 36*  BILITOT 0.9  PROT 7.5  ALBUMIN 3.9   No results for input(s): LIPASE, AMYLASE in the last 168 hours. No results for input(s): AMMONIA in the last 168 hours. CBC:  Recent Labs Lab  04/01/14 1745  WBC 5.4  NEUTROABS 3.4  HGB 12.7  HCT 38.6  MCV 83.9  PLT 181   Cardiac Enzymes:  Recent Labs Lab 04/01/14 1745  TROPONINI 0.25*    BNP (last 3 results) No results for input(s): PROBNP in the last 8760 hours. CBG: No results for input(s): GLUCAP in the last 168 hours.  Radiological Exams on Admission: Ct Head Wo Contrast  04/01/2014   CLINICAL DATA:  Unresponsive tonight.  Initial encounter.  EXAM: CT HEAD WITHOUT CONTRAST  TECHNIQUE: Contiguous axial images were obtained from the base of the skull through the vertex without intravenous contrast.  COMPARISON:  Head CT 12/25/2003.  MRI brain 03/24/2013.  FINDINGS: There is no evidence of acute intracranial hemorrhage, mass lesion, brain edema or extra-axial fluid collection. The ventricles and subarachnoid spaces are mildly  prominent but stable. Extensive chronic small vessel ischemic changes and old cortical infarcts are again noted bilaterally. There is increased low-density and gyral swelling in the left frontotemporal region (images 13-16) which could reflect a subacute infarct. Intracranial vascular calcifications again noted.  The visualized paranasal sinuses, mastoid air cells and middle ears are clear. The calvarium is intact.  IMPRESSION: Extensive chronic ischemic changes bilaterally with possible subacute extension in the left frontotemporal region. No evidence of acute intracranial hemorrhage.   Electronically Signed   By: Roxy Horseman M.D.   On: 04/01/2014 20:55   Dg Chest Port 1 View  04/01/2014   CLINICAL DATA:  Altered mental status with weakness. Possible stroke. Initial encounter.  EXAM: PORTABLE CHEST - 1 VIEW  COMPARISON:  12/24/2013.  FINDINGS: 1812 hr. There is stable cardiomegaly and vascular ectasia. The lungs are clear. There is no pleural effusion or pneumothorax. No acute osseous findings are seen. Multiple telemetry leads overlie the chest.  IMPRESSION: Stable cardiomegaly.  No acute  cardiopulmonary process demonstrated.   Electronically Signed   By: Roxy Horseman M.D.   On: 04/01/2014 18:41    EKG: Independently reviewed. PVC, T-wave inversion in lead I  Assessment/Plan Principal Problem:   Expressive aphasia Active Problems:   Diabetes mellitus   Hypertension   Fabry disease   Atrial fibrillation   Tobacco abuse   Elevated troponin   Stroke   Aphasia  Expressive aphagia: CT head is negative for intracranial bleeding. Neurology was consulted, concerning for left brain CVA versus possible seizure. -will admit to SDU -follow up Neuro's recommendations as follows: 1. HgbA1c, fasting lipid panel 2. MRI, MRA  of the brain without contrast 3. PT consult, OT consult, Speech consult 4. Echocardiogram 5. Carotid dopplers 6. Prophylactic therapy-Xeralto at home dose unless CT head shows bleed 7. Risk factor modification 8. Telemetry monitoring 9. Frequent neuro checks 10 NPO until passes stroke swallow screen 11. EEG  HTN:  -will hold lisinopril, HCTZ in the setting of possible acute stroke. -Continue metoprolol  Elevated troponin: Troponin 0.25. Difficult to assess whether patient has chest pain. Cardiology was consulted. Dr. Shirlee Latch evaluated the patient, and thought this may be demand ischemia with acute on chronic diastolic CHF and hypoxia, may also be related to CVA. -Appreciate Dr. Alford Highland recommendations -Trop x 3 - ASA and lipitor,  -continue metoprolol -Continue Xarelto.     Acute on chronic diastolic CHF:  Per Dr. 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  She is requiring Bipap due to hypoxia.  She has JVD and peripheral edema, so Dr. Shirlee Latch suspects patient has component of CHF.   - Start Lasix 40 mg IV bid, 1st dose now.    - Check BNP, follow BMET.    Afib: Heart rate is well controlled.  Patient is on Xarelto at home. -Continue metoprolol -Continue Xarelto  Fabry disease: on Agalsidase at home.  Hyperlipidemia: LDL was 104 on 03/24/13. -Continue Lipitor  DVT ppx: Xarelto  Code Status: Full code Family Communication: None at bed side.   Disposition Plan: Admit to inpatient   Date of Service 04/01/2014    Lorretta Harp [facility] Pager [phone]  If 7PM-7AM, please contact night-coverage [email or web address] Password [facility] 04/01/2014, 11:21 PM

## 2014-04-01 NOTE — ED Notes (Signed)
Resp. Tech called to transport pt to CT

## 2014-04-01 NOTE — ED Notes (Signed)
Return from CT.  Pt will open eyes to loud stimuli, but will not follow any other commands.

## 2014-04-01 NOTE — Consult Note (Addendum)
Referring Physician: Preston Fleeting    Chief Complaint: Stroke  HPI:                                                                                                                                         Rebecca [name] is an 69 y.o. female Who was recently  In hospital with stroke and placed on Xeralto at that time for Afib. Stroke work up at that time as below. At that time she was noted to have expressive aphasia and mild weakness on the right side. Patient was brought to hospital today after her sone came home and found her leaning on the counter "slumped over and not communicating.  She also was shivering in all 4 extremities".  She was brought to ED where she was noted to have respiratory compromise and placed on BIPAP. Currently she is awake but not conversive, will follow only simple commands and more notably on the left side. Winces to pain on the left greater than right.   Per son her baseline was --walking with a cane, able to converse but had difficulty with exp[ressing herself. No right sided weakness.    MRI of the brain 03/24/2013 1. Acute anterior left MCA territory infarct. No mass effect or hemorrhage. 2. Advanced underlying chronic small and medium-sized vessel ischemia throughout the brain.   MRA of the brain 03/24/2013 Generalized intracranial artery dolichoectasia. No left MCA branch occlusion or focal stenosis identified. Artifactual signal loss in the proximal basilar artery.   2D Echocardiogram EF 60-65%. No thrombus. Possible small PFO by color doppler. Moderate pulmonary hypertension  TEE 03/26/2013 Severely dilated left atrium, large LA independent with borderline emptying velocities, no LA thrombus, severe left ventricular hypertrophy with preserved systolic function, no significant valvular disease. There is strong evidence suggestive of recent atrial fibrillation and high risk of LA thrombus. Cardiologist recommends anti-coagulation.  Carotid Doppler No  evidence of hemodynamically significant internal carotid artery stenosis. Vertebral artery flow is antegrade.       Date last known well: Date: 04/01/2014 Time last known well: Time: 08:30 tPA Given: No: on Xeralto  Past Medical History  Diagnosis Date  . Hypertension   . 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 Expressive aphasia 03/23/2013  . Atrial fibrillation 05/01/2013  . Tobacco abuse 05/01/2013  . Diabetes mellitus without complication   . Depression   . Stroke     2015    Past Surgical History  Procedure Laterality Date  . Tee without cardioversion N/A 03/26/2013    Procedure: TRANSESOPHAGEAL ECHOCARDIOGRAM (TEE);  Surgeon: Thurmon Fair, MD;  Location: [facility] ENDOSCOPY;  Service: Cardiovascular;  Laterality: N/A;  . Cesarean section      x3  . Hip replacement Right 1990's  . Hip replacemet Left 1990's    Family History  Problem Relation Age of Onset  . Alcohol abuse Maternal Aunt    Social History:  reports that  she has never smoked. She does not have any smokeless tobacco history on file. She reports that she does not drink alcohol or use illicit drugs.  Allergies: No Known Allergies  Medications:                                                                                                                           No current facility-administered medications for this encounter.   Current Outpatient Prescriptions  Medication Sig Dispense Refill  . ALPHAGAN P 0.1 % SOLN Place 1 drop into both eyes at bedtime.     [name] atorvastatin (LIPITOR) 40 MG tablet Take 1 tablet (40 mg total) by mouth daily at 6 PM. 30 tablet 0  . escitalopram (LEXAPRO) 5 MG tablet Take 1 tablet (5 mg total) by mouth daily. 30 tablet 1  . hydrochlorothiazide (HYDRODIURIL) 25 MG tablet Take 1 tablet (25 mg total) by mouth daily. 30 tablet 0  . lisinopril (PRINIVIL,ZESTRIL) 5 MG tablet Take 1 tablet (5 mg total) by mouth daily. 30 tablet 0  . metoprolol succinate (TOPROL-XL) 25 MG 24 hr tablet TAKE 1 TABLET  THREE TIMES A WEEK 39 tablet 6  . rivaroxaban (XARELTO) 20 MG TABS tablet Take 1 tablet (20 mg total) by mouth daily. 90 tablet 0     ROS:                                                                                                                                       History obtained from son  General ROS: negative for - chills, fatigue, fever, night sweats, weight gain or weight loss Psychological ROS: negative for - behavioral disorder, hallucinations, memory difficulties, mood swings or suicidal ideation Ophthalmic ROS: negative for - blurry vision, double vision, eye pain or loss of vision ENT ROS: negative for - epistaxis, nasal discharge, oral lesions, sore throat, tinnitus or vertigo Allergy and Immunology ROS: negative for - hives or itchy/watery eyes Hematological and Lymphatic ROS: negative for - bleeding problems, bruising or swollen lymph nodes Endocrine ROS: negative for - galactorrhea, hair pattern changes, polydipsia/polyuria or temperature intolerance Respiratory ROS: negative for - cough, hemoptysis, shortness of breath or wheezing Cardiovascular ROS: negative for - chest pain, dyspnea on exertion, edema or irregular heartbeat Gastrointestinal ROS: negative for - abdominal pain, diarrhea, hematemesis, nausea/vomiting or stool incontinence Genito-Urinary ROS: negative for - dysuria, hematuria, incontinence or  urinary frequency/urgency Musculoskeletal ROS: negative for - joint swelling or muscular weakness Neurological ROS: as noted in HPI Dermatological ROS: negative for rash and skin lesion changes  Neurologic Examination:                                                                                                      Blood pressure 166/98, pulse 84, resp. rate 24, SpO2 90 %.  HEENT-  Normocephalic, no lesions, without obvious abnormality.  Normal external eye and conjunctiva.  Normal TM's bilaterally.  Normal auditory canals and external ears. Normal external  nose, mucus membranes and septum.  Normal pharynx. Cardiovascular- S1, S2 normal, pulses palpable throughout   Lungs- mild expiratory wheezing heard both lower lobes Abdomen- soft, non-tender; bowel sounds normal; no masses,  no organomegaly Extremities- no edema Lymph-no adenopathy palpable Musculoskeletal-no muscular tenderness noted Skin-warm and dry, no hyperpigmentation, vitiligo, or suspicious lesions  Neurological Examination Mental Status: Alert, on Bipap, follows only simple commands such as squeezing my hand.  Non vocal.  Cranial Nerves: II: Discs flat bilaterally; blinks to threat bilaterally, pupils equal, round, reactive to light and accommodation III,IV, VI: ptosis not present, doll's intact but will not track my finger V,VII: face symmetric, winces to pain in bilateral face VIII: opens eyes to name called IX,X: gag reflex present XI: bilateral shoulder shrug XII: midline tongue extension Motor: Able to left arm antigravity, right arm falls to bed.  Withdraws bilateral legs from noxious stimuli left >right.  Sensory: Pinprick and light touch intact throughout, bilaterally Deep Tendon Reflexes: 2+ and symmetric throughout, 1+ bilateral KJ and no AJ Plantars: Right: downgoing   Left: downgoing Cerebellar: Unable to assess Gait: unable to assess.        Lab Results: Basic Metabolic Panel: No results for input(s): NA, K, CL, CO2, GLUCOSE, BUN, CREATININE, CALCIUM, MG, PHOS in the last 168 hours.  Liver Function Tests: No results for input(s): AST, ALT, ALKPHOS, BILITOT, PROT, ALBUMIN in the last 168 hours. No results for input(s): LIPASE, AMYLASE in the last 168 hours. No results for input(s): AMMONIA in the last 168 hours.  CBC: No results for input(s): WBC, NEUTROABS, HGB, HCT, MCV, PLT in the last 168 hours.  Cardiac Enzymes: No results for input(s): CKTOTAL, CKMB, CKMBINDEX, TROPONINI in the last 168 hours.  Lipid Panel: No results for input(s): CHOL,  TRIG, HDL, CHOLHDL, VLDL, LDLCALC in the last 168 hours.  CBG: No results for input(s): GLUCAP in the last 168 hours.  Microbiology: Results for orders placed or performed during the hospital encounter of 03/23/13  Urine culture     Status: None   Collection Time: 03/23/13  3:20 PM  Result Value Ref Range Status   Specimen Description URINE, CLEAN CATCH  Final   Special Requests NONE  Final   Culture  Setup Time   Final    03/23/2013 21:22 Performed at [facility] Count   Final    40,000 COLONIES/ML Performed at [facility]   Culture   Final    Multiple bacterial morphotypes present, none  predominant. Suggest appropriate recollection if clinically indicated. Performed at [facility]   Report Status 03/24/2013 FINAL  Final    Coagulation Studies: No results for input(s): LABPROT, INR in the last 72 hours.  Imaging: No results found.   Felicie Morn PA-C [facility] [phone]  04/01/2014, 6:02 PM   Assessment: 69 y.o. female with multiple risk factors for stroke, including previous cerebral infarction, presenting with probable recurrent left MCA territory acute ischemic infarction. Seizure activity is less likely but cannot be ruled out at this point. CT scan of the head is pending.   Stroke Risk Factors - atrial fibrillation, diabetes mellitus and hypertension   1. HgbA1c, fasting lipid panel 2. MRI, MRA  of the brain without contrast 3. PT consult, OT consult, Speech consult 4. Echocardiogram 5. Carotid dopplers 6. Prophylactic therapy-Xeralto at home dose unless CT head shows bleed 7. Risk factor modification 8. Telemetry monitoring 9. Frequent neuro checks 10 NPO until passes stroke swallow screen 11. EEG  I personally participated in this patient's evaluation and management, including formulating the above clinical impression and management recommendations.  Venetia Maxon M.D. [facility] [phone]

## 2014-04-01 NOTE — ED Notes (Signed)
Family at bedside. 

## 2014-04-01 NOTE — ED Provider Notes (Signed)
CSN: [id number]     Arrival date & time 04/01/14  1726 History   First MD Initiated Contact with Patient 04/01/14 1728     Chief Complaint  Patient presents with  . Shortness of Breath  . Weakness     (Consider location/radiation/quality/duration/timing/severity/associated sxs/prior Treatment) Patient is a 69 y.o. female presenting with shortness of breath and weakness. The history is provided by the EMS personnel. The history is limited by the condition of the patient (Aphasia).  Shortness of Breath Weakness Associated symptoms include shortness of breath.  She was found by family some significant wall. Last seen normal at about 8:30 AM. She was not able to speak. EMS was called and did not activate code stroke because it had been greater than 8 hours since last seen normal. She does have history of a stroke. EMS noted some wheezing and oxygen saturation 90% and gave her a nebulizer treatment which is in process currently.  Past Medical History  Diagnosis Date  . Hypertension   . 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 Expressive aphasia 03/23/2013  . Atrial fibrillation 05/01/2013  . Tobacco abuse 05/01/2013  . Diabetes mellitus without complication   . Depression   . Stroke     2015   Past Surgical History  Procedure Laterality Date  . Tee without cardioversion N/A 03/26/2013    Procedure: TRANSESOPHAGEAL ECHOCARDIOGRAM (TEE);  Surgeon: Thurmon Fair, MD;  Location: [facility] ENDOSCOPY;  Service: Cardiovascular;  Laterality: N/A;  . Cesarean section      x3  . Hip replacement Right 1990's  . Hip replacemet Left 1990's   Family History  Problem Relation Age of Onset  . Alcohol abuse Maternal Aunt    History  Substance Use Topics  . Smoking status: Never Smoker   . Smokeless tobacco: Not on file  . Alcohol Use: No   OB History    No data available     Review of Systems  Unable to perform ROS: Patient nonverbal  Respiratory: Positive for shortness of breath.   Neurological: Positive for  weakness.      Allergies  Review of patient's allergies indicates no known allergies.  Home Medications   Prior to Admission medications   Medication Sig Start Date End Date Taking? Authorizing Provider  ALPHAGAN P 0.1 % SOLN Place 1 drop into both eyes at bedtime.  07/07/13   Historical Provider, MD  atorvastatin (LIPITOR) 40 MG tablet Take 1 tablet (40 mg total) by mouth daily at 6 PM. 03/27/13   Myra Rude, MD  escitalopram (LEXAPRO) 5 MG tablet Take 1 tablet (5 mg total) by mouth daily. 02/19/14   Thresa Ross, MD  hydrochlorothiazide (HYDRODIURIL) 25 MG tablet Take 1 tablet (25 mg total) by mouth daily. 03/27/13   Myra Rude, MD  lisinopril (PRINIVIL,ZESTRIL) 5 MG tablet Take 1 tablet (5 mg total) by mouth daily. 03/27/13   Myra Rude, MD  metoprolol succinate (TOPROL-XL) 25 MG 24 hr tablet TAKE 1 TABLET THREE TIMES A WEEK 02/03/14   Lyn Records III, MD  rivaroxaban (XARELTO) 20 MG TABS tablet Take 1 tablet (20 mg total) by mouth daily. 03/03/14   Duke Salvia, MD   BP 188/109 mmHg  Pulse 80  Resp 33  SpO2 99% Physical Exam  Nursing note and vitals reviewed.  69 year old female, resting comfortably and in no acute distress. Vital signs are significant for tachypnea and hypertension. Oxygen saturation is 99%, which is normal. Head is normocephalic and atraumatic.  Pupils are 3 mm and sluggishly reactive Oropharynx is clear. Neck is nontender and supple without adenopathy or JVD. Back is nontender and there is no CVA tenderness. Lungs have faint expiratory wheezes. Chest is nontender. Heart has regular rate and rhythm without murmur. Abdomen is soft, flat, nontender without masses or hepatosplenomegaly and peristalsis is normoactive. Extremities have 2+ edema, full range of motion is present. Skin is warm and dry without rash. Neurologic: She is awake but completely nonverbal and does not follow commands. She will look to the left but not to the right. Slight  right facial droop is present. She does seem to have some spontaneous movement of her left arm but there is very little spontaneous movement and she does not grasp with either hand and both arms will fall when picked up. There is no Babinski reflex.  ED Course  Procedures (including critical care time) Labs Review Results for orders placed or performed during the hospital encounter of 04/01/14  Comprehensive metabolic panel  Result Value Ref Range   Sodium 143 135 - 145 mmol/L   Potassium 4.3 3.5 - 5.1 mmol/L   Chloride 105 96 - 112 mEq/L   CO2 19 19 - 32 mmol/L   Glucose, Bld 130 (H) 70 - 99 mg/dL   BUN 21 6 - 23 mg/dL   Creatinine, Ser 2.44 0.50 - 1.10 mg/dL   Calcium 9.6 8.4 - 01.0 mg/dL   Total Protein 7.5 6.0 - 8.3 g/dL   Albumin 3.9 3.5 - 5.2 g/dL   AST 38 (H) 0 - 37 U/L   ALT 15 0 - 35 U/L   Alkaline Phosphatase 36 (L) 39 - 117 U/L   Total Bilirubin 0.9 0.3 - 1.2 mg/dL   GFR calc non Af Amer 54 (L) >90 mL/min   GFR calc Af Amer 62 (L) >90 mL/min   Anion gap 19 (H) 5 - 15  Troponin I  Result Value Ref Range   Troponin I 0.25 (H) <0.031 ng/mL  CBC with Differential  Result Value Ref Range   WBC 5.4 4.0 - 10.5 K/uL   RBC 4.60 3.87 - 5.11 MIL/uL   Hemoglobin 12.7 12.0 - 15.0 g/dL   HCT 27.2 53.6 - 64.4 %   MCV 83.9 78.0 - 100.0 fL   MCH 27.6 26.0 - 34.0 pg   MCHC 32.9 30.0 - 36.0 g/dL   RDW 03.4 (H) 74.2 - 59.5 %   Platelets 181 150 - 400 K/uL   Neutrophils Relative % 65 43 - 77 %   Neutro Abs 3.4 1.7 - 7.7 K/uL   Lymphocytes Relative 28 12 - 46 %   Lymphs Abs 1.5 0.7 - 4.0 K/uL   Monocytes Relative 7 3 - 12 %   Monocytes Absolute 0.4 0.1 - 1.0 K/uL   Eosinophils Relative 0 0 - 5 %   Eosinophils Absolute 0.0 0.0 - 0.7 K/uL   Basophils Relative 0 0 - 1 %   Basophils Absolute 0.0 0.0 - 0.1 K/uL  Urinalysis, Routine w reflex microscopic  Result Value Ref Range   Color, Urine YELLOW YELLOW   APPearance HAZY (A) CLEAR   Specific Gravity, Urine 1.021 1.005 - 1.030    pH 5.0 5.0 - 8.0   Glucose, UA NEGATIVE NEGATIVE mg/dL   Hgb urine dipstick NEGATIVE NEGATIVE   Bilirubin Urine NEGATIVE NEGATIVE   Ketones, ur NEGATIVE NEGATIVE mg/dL   Protein, ur 638 (A) NEGATIVE mg/dL   Urobilinogen, UA 0.2 0.0 - 1.0 mg/dL  Nitrite NEGATIVE NEGATIVE   Leukocytes, UA NEGATIVE NEGATIVE  Ethanol  Result Value Ref Range   Alcohol, Ethyl (B) <5 0 - 9 mg/dL  Urine microscopic-add on  Result Value Ref Range   Squamous Epithelial / LPF RARE RARE   RBC / HPF 0-2 <3 RBC/hpf   Bacteria, UA RARE RARE   Casts HYALINE CASTS (A) NEGATIVE   Urine-Other MUCOUS PRESENT    Imaging Review Ct Head Wo Contrast  04/01/2014   CLINICAL DATA:  Unresponsive tonight.  Initial encounter.  EXAM: CT HEAD WITHOUT CONTRAST  TECHNIQUE: Contiguous axial images were obtained from the base of the skull through the vertex without intravenous contrast.  COMPARISON:  Head CT 12/25/2003.  MRI brain 03/24/2013.  FINDINGS: There is no evidence of acute intracranial hemorrhage, mass lesion, brain edema or extra-axial fluid collection. The ventricles and subarachnoid spaces are mildly prominent but stable. Extensive chronic small vessel ischemic changes and old cortical infarcts are again noted bilaterally. There is increased low-density and gyral swelling in the left frontotemporal region (images 13-16) which could reflect a subacute infarct. Intracranial vascular calcifications again noted.  The visualized paranasal sinuses, mastoid air cells and middle ears are clear. The calvarium is intact.  IMPRESSION: Extensive chronic ischemic changes bilaterally with possible subacute extension in the left frontotemporal region. No evidence of acute intracranial hemorrhage.   Electronically Signed   By: Roxy Horseman M.D.   On: 04/01/2014 20:55   Dg Chest Port 1 View  04/01/2014   CLINICAL DATA:  Altered mental status with weakness. Possible stroke. Initial encounter.  EXAM: PORTABLE CHEST - 1 VIEW  COMPARISON:   12/24/2013.  FINDINGS: 1812 hr. There is stable cardiomegaly and vascular ectasia. The lungs are clear. There is no pleural effusion or pneumothorax. No acute osseous findings are seen. Multiple telemetry leads overlie the chest.  IMPRESSION: Stable cardiomegaly.  No acute cardiopulmonary process demonstrated.   Electronically Signed   By: Roxy Horseman M.D.   On: 04/01/2014 18:41     EKG Interpretation   Date/Time:  Wednesday April 01 2014 17:36:41 EST Ventricular Rate:  81 PR Interval:  183 QRS Duration: 108 QT Interval:  384 QTC Calculation: 446 R Axis:   -49 Text Interpretation:  Sinus rhythm Multiform ventricular premature  complexes Left anterior fascicular block LVH with secondary repolarization  abnormality Anterior Q waves, possibly due to LVH When compared with ECG  of 12/24/2013, Premature ventricular complexes are now Present Confirmed by  [facility]  MD, Samayra Hebel ([id number]) on 04/01/2014 6:00:17 PM      CRITICAL CARE Performed by: [email or web address] Total critical care time: 55 minutes Critical care time was exclusive of separately billable procedures and treating other patients. Critical care was necessary to treat or prevent imminent or life-threatening deterioration. Critical care was time spent personally by me on the following activities: development of treatment plan with patient and/or surrogate as well as nursing, discussions with consultants, evaluation of patient's response to treatment, examination of patient, obtaining history from patient or surrogate, ordering and performing treatments and interventions, ordering and review of laboratory studies, ordering and review of radiographic studies, pulse oximetry and re-evaluation of patient's condition.  MDM   Final diagnoses:  Weakness  Stroke  Elevated troponin I level    Sudden change in mentation with vague findings of right sided weakness relative to the left. Stroke is strongly suspected and she'll be sent for CT scan.  However, she is outside the window for interventions. Neurology has been consulted. Old records  of been reviewed and she had states stroke 1 year ago which manifested itself with expressive aphasia.  Family members arrived and states that current presentation is vertex identical to what she had last year. She is on rivaroxaban, so she is ready anticoagulated and does not need additional anticoagulation. Neurology has been consulted and agreed that this does appear to be a new stroke and just recommend continued current treatment. She is not a candidate for any interventions. Troponin is noted to be mildly elevated and this is of uncertain cause. Case is discussed with Dr. Clyde Lundborg of [facility] who agrees to admit the patient. He requests cardiology consultation be obtained because of elevated troponin. She actually is being treated appropriately for possible ACS and she is anticoagulated. Case has been discussed with Dr. Shirlee Latch of cardiology agrees to see the patient in consultation.  Dione Booze, MD 04/01/14 [phone]

## 2014-04-01 NOTE — ED Notes (Signed)
Neurology at bedside.

## 2014-04-02 ENCOUNTER — Ambulatory Visit (HOSPITAL_COMMUNITY): Payer: Medicare Other | Admitting: Psychiatry

## 2014-04-02 ENCOUNTER — Inpatient Hospital Stay (HOSPITAL_COMMUNITY): Payer: Medicare Other

## 2014-04-02 DIAGNOSIS — I4891 Unspecified atrial fibrillation: Secondary | ICD-10-CM

## 2014-04-02 DIAGNOSIS — R7989 Other specified abnormal findings of blood chemistry: Secondary | ICD-10-CM | POA: Diagnosis present

## 2014-04-02 DIAGNOSIS — R748 Abnormal levels of other serum enzymes: Secondary | ICD-10-CM

## 2014-04-02 DIAGNOSIS — R778 Other specified abnormalities of plasma proteins: Secondary | ICD-10-CM | POA: Diagnosis present

## 2014-04-02 DIAGNOSIS — E785 Hyperlipidemia, unspecified: Secondary | ICD-10-CM

## 2014-04-02 DIAGNOSIS — I5043 Acute on chronic combined systolic (congestive) and diastolic (congestive) heart failure: Secondary | ICD-10-CM | POA: Diagnosis present

## 2014-04-02 DIAGNOSIS — I359 Nonrheumatic aortic valve disorder, unspecified: Secondary | ICD-10-CM

## 2014-04-02 DIAGNOSIS — I5031 Acute diastolic (congestive) heart failure: Secondary | ICD-10-CM

## 2014-04-02 DIAGNOSIS — I272 Other secondary pulmonary hypertension: Secondary | ICD-10-CM

## 2014-04-02 DIAGNOSIS — R130 Aphagia: Secondary | ICD-10-CM

## 2014-04-02 DIAGNOSIS — I482 Chronic atrial fibrillation, unspecified: Secondary | ICD-10-CM | POA: Diagnosis present

## 2014-04-02 DIAGNOSIS — I10 Essential (primary) hypertension: Secondary | ICD-10-CM | POA: Diagnosis present

## 2014-04-02 DIAGNOSIS — E119 Type 2 diabetes mellitus without complications: Secondary | ICD-10-CM

## 2014-04-02 LAB — BASIC METABOLIC PANEL
Anion gap: 13 (ref 5–15)
BUN: 19 mg/dL (ref 6–23)
CALCIUM: 9.5 mg/dL (ref 8.4–10.5)
CO2: 23 mmol/L (ref 19–32)
CREATININE: 0.97 mg/dL (ref 0.50–1.10)
Chloride: 109 mEq/L (ref 96–112)
GFR, EST AFRICAN AMERICAN: 68 mL/min — AB (ref >90)
GFR, EST NON AFRICAN AMERICAN: 58 mL/min — AB (ref >90)
Glucose, Bld: 149 mg/dL — ABNORMAL HIGH (ref 70–99)
Potassium: 3.9 mmol/L (ref 3.5–5.1)
SODIUM: 145 mmol/L (ref 135–145)

## 2014-04-02 LAB — GLUCOSE, CAPILLARY
GLUCOSE-CAPILLARY: 129 mg/dL — AB (ref 70–99)
GLUCOSE-CAPILLARY: 107 mg/dL — AB (ref 70–99)
GLUCOSE-CAPILLARY: 91 mg/dL (ref 70–99)
Glucose-Capillary: 103 mg/dL — ABNORMAL HIGH (ref 70–99)
Glucose-Capillary: 142 mg/dL — ABNORMAL HIGH (ref 70–99)
Glucose-Capillary: 154 mg/dL — ABNORMAL HIGH (ref 70–99)

## 2014-04-02 LAB — TROPONIN I
Troponin I: 0.27 ng/mL — ABNORMAL HIGH (ref <0.031)
Troponin I: 0.28 ng/mL — ABNORMAL HIGH (ref <0.031)
Troponin I: 0.28 ng/mL — ABNORMAL HIGH (ref <0.031)

## 2014-04-02 LAB — LIPID PANEL
CHOL/HDL RATIO: 2.5 ratio
CHOLESTEROL: 177 mg/dL (ref 0–200)
HDL: 71 mg/dL (ref >39)
LDL CALC: 96 mg/dL (ref 0–99)
Triglycerides: 50 mg/dL (ref <150)
VLDL: 10 mg/dL (ref 0–40)

## 2014-04-02 LAB — RAPID URINE DRUG SCREEN, HOSP PERFORMED
AMPHETAMINES: NOT DETECTED
BARBITURATES: NOT DETECTED
BENZODIAZEPINES: NOT DETECTED
Cocaine: NOT DETECTED
Opiates: NOT DETECTED
Tetrahydrocannabinol: NOT DETECTED

## 2014-04-02 LAB — HEMOGLOBIN A1C
Hgb A1c MFr Bld: 6.1 % — ABNORMAL HIGH (ref <5.7)
MEAN PLASMA GLUCOSE: 128 mg/dL — AB (ref <117)

## 2014-04-02 LAB — MRSA PCR SCREENING: MRSA BY PCR: NEGATIVE

## 2014-04-02 LAB — BRAIN NATRIURETIC PEPTIDE: B NATRIURETIC PEPTIDE 5: 2537.4 pg/mL — AB (ref 0.0–100.0)

## 2014-04-02 MED ORDER — ACETAMINOPHEN 500 MG PO TABS: 500.0000 mg | ORAL_TABLET | Freq: Four times a day (QID) | ORAL | Status: DC | PRN

## 2014-04-02 MED ORDER — RIVAROXABAN 20 MG PO TABS
20.0000 mg | ORAL_TABLET | Freq: Every day | ORAL | Status: DC
  Filled 2014-04-02 (×2): qty 1

## 2014-04-02 MED ORDER — CETYLPYRIDINIUM CHLORIDE 0.05 % MT LIQD
7.0000 mL | Freq: Two times a day (BID) | OROMUCOSAL | Status: DC
  Administered 2014-04-02 – 2014-04-07 (×10): 7 mL via OROMUCOSAL

## 2014-04-02 MED ORDER — METOPROLOL SUCCINATE ER 25 MG PO TB24
25.0000 mg | ORAL_TABLET | Freq: Every day | ORAL | Status: DC
  Filled 2014-04-02: qty 1

## 2014-04-02 MED ORDER — ADULT MULTIVITAMIN W/MINERALS CH
1.0000 | ORAL_TABLET | Freq: Every day | ORAL | Status: DC
  Administered 2014-04-05 – 2014-04-07 (×3): 1 via ORAL
  Filled 2014-04-02 (×6): qty 1

## 2014-04-02 MED ORDER — METOPROLOL TARTRATE 1 MG/ML IV SOLN
5.0000 mg | Freq: Four times a day (QID) | INTRAVENOUS | Status: DC
  Administered 2014-04-02 – 2014-04-03 (×3): 5 mg via INTRAVENOUS
  Filled 2014-04-02 (×7): qty 5

## 2014-04-02 MED ORDER — CHLORHEXIDINE GLUCONATE 0.12 % MT SOLN
15.0000 mL | Freq: Two times a day (BID) | OROMUCOSAL | Status: DC
  Administered 2014-04-02 – 2014-04-07 (×11): 15 mL via OROMUCOSAL
  Filled 2014-04-02 (×14): qty 15

## 2014-04-02 MED ORDER — LORATADINE 10 MG PO TABS
10.0000 mg | ORAL_TABLET | Freq: Every day | ORAL | Status: DC
  Administered 2014-04-05 – 2014-04-07 (×3): 10 mg via ORAL
  Filled 2014-04-02 (×6): qty 1

## 2014-04-02 MED ORDER — ASPIRIN 300 MG RE SUPP
300.0000 mg | Freq: Every day | RECTAL | Status: DC
  Administered 2014-04-02 – 2014-04-06 (×5): 300 mg via RECTAL
  Filled 2014-04-02 (×5): qty 1

## 2014-04-02 MED ORDER — ATORVASTATIN CALCIUM 40 MG PO TABS
40.0000 mg | ORAL_TABLET | Freq: Every day | ORAL | Status: DC
Start: 2014-04-02 — End: 2014-04-07
  Administered 2014-04-04 – 2014-04-06 (×2): 40 mg via ORAL
  Filled 2014-04-02 (×7): qty 1

## 2014-04-02 MED ORDER — ESCITALOPRAM OXALATE 5 MG PO TABS
5.0000 mg | ORAL_TABLET | Freq: Every day | ORAL | Status: DC
  Administered 2014-04-05 – 2014-04-07 (×3): 5 mg via ORAL
  Filled 2014-04-02 (×7): qty 1

## 2014-04-02 MED ORDER — STROKE: EARLY STAGES OF RECOVERY BOOK
Freq: Once | Status: AC
  Administered 2014-04-02: 02:00:00
  Filled 2014-04-02: qty 1

## 2014-04-02 MED ORDER — ASPIRIN EC 81 MG PO TBEC
81.0000 mg | DELAYED_RELEASE_TABLET | Freq: Every day | ORAL | Status: DC
  Filled 2014-04-02: qty 1

## 2014-04-02 MED ORDER — DEXTROSE 5 % IV SOLN
INTRAVENOUS | Status: DC
  Administered 2014-04-02: 50 mL via INTRAVENOUS

## 2014-04-02 MED ORDER — INSULIN ASPART 100 UNIT/ML ~~LOC~~ SOLN
0.0000 [IU] | SUBCUTANEOUS | Status: DC
  Administered 2014-04-05 – 2014-04-06 (×7): 1 [IU] via SUBCUTANEOUS

## 2014-04-02 MED ORDER — POLYVINYL ALCOHOL 1.4 % OP SOLN: 1.0000 [drp] | OPHTHALMIC | Status: DC | PRN

## 2014-04-02 MED ORDER — SODIUM CHLORIDE 0.9 % IV SOLN
INTRAVENOUS | Status: DC
  Administered 2014-04-02: 01:00:00 via INTRAVENOUS
  Administered 2014-04-04: 10 mL/h via INTRAVENOUS

## 2014-04-02 MED ORDER — SENNOSIDES-DOCUSATE SODIUM 8.6-50 MG PO TABS
1.0000 | ORAL_TABLET | Freq: Every evening | ORAL | Status: DC | PRN
Start: 2014-04-02 — End: 2014-04-07
  Filled 2014-04-02: qty 1

## 2014-04-02 NOTE — Progress Notes (Signed)
Subjective: No chest pain, no SOB, responded by shaking her head  Objective: Vital signs in last 24 hours: Temp:  [98.1 F (36.7 C)-98.5 F (36.9 C)] 98.1 F (36.7 C) (01/14 0400) Pulse Rate:  [54-88] 62 (01/14 0800) Resp:  [11-33] 23 (01/14 0800) BP: (133-188)/(71-121) 133/71 mmHg (01/14 0800) SpO2:  [88 %-100 %] 95 % (01/14 0800) FiO2 (%):  [50 %] 50 % (01/14 0400) Weight change:    Intake/Output from previous day: -1105 01/13 0701 - 01/14 0700 In: 570 [I.V.:470; IV Piggyback:100] Out: 1600 [Urine:1600] Intake/Output this shift:    PE: General:Pleasant to flat affect, NAD, having echo done Skin:Warm and dry, brisk capillary refill HEENT:normocephalic, sclera clear, mucus membranes moist Heart:S1S2 RRR without murmur, gallup, rub or click Lungs:clear without rales, rhonchi, or wheezes ZOX:WRUE, non tender, + BS, do not palpate liver spleen or masses Ext:tr lower ext edema, 2+ pedal pulses, 2+ radial pulses Neuro:alert would not smile or stick out her tongue.appeared irritated to be asked, MAE, + facial symmetry Tele:  With SR with PACs, PVCs and some non conducted PACs, also short bursts of NSVT at times.  EKG:  SR with T wave inversion in V5-6  Lab Results:  Recent Labs  04/01/14 1745  WBC 5.4  HGB 12.7  HCT 38.6  PLT 181   BMET  Recent Labs  04/01/14 1745 04/02/14 0420  NA 143 145  K 4.3 3.9  CL 105 109  CO2 19 23  GLUCOSE 130* 149*  BUN 21 19  CREATININE 1.04 0.97  CALCIUM 9.6 9.5    Recent Labs  04/01/14 2210 04/02/14 0420  TROPONINI 0.27* 0.28*    Lab Results  Component Value Date   CHOL 177 04/02/2014   HDL 71 04/02/2014   LDLCALC 96 04/02/2014   TRIG 50 04/02/2014   CHOLHDL 2.5 04/02/2014   Lab Results  Component Value Date   HGBA1C 6.2* 03/24/2013      Hepatic Function Panel  Recent Labs  04/01/14 1745  PROT 7.5  ALBUMIN 3.9  AST 38*  ALT 15  ALKPHOS 36*  BILITOT 0.9    Recent Labs  04/02/14 0420    CHOL 177   No results for input(s): PROTIME in the last 72 hours.     Studies/Results: Dg Chest 2 View  04/02/2014   CLINICAL DATA:  Shortness of breath, and aphasia  EXAM: CHEST  2 VIEW  COMPARISON:  Portable chest x-ray of April 01, 2014  FINDINGS: The lungs are well-expanded and clear. The cardiopericardial silhouette is enlarged. The pulmonary vascularity is mildly prominent centrally without cephalization. There is no pulmonary edema. There is tortuosity of the descending thoracic aorta. The bony thorax exhibits no acute abnormalities.  IMPRESSION: Cardiomegaly without pulmonary edema. There is no pneumonia. Stable appearance of the chest since yesterday's study.   Electronically Signed   By: David  [place]   On: 04/02/2014 07:38   Ct Head Wo Contrast  04/01/2014   CLINICAL DATA:  Unresponsive tonight.  Initial encounter.  EXAM: CT HEAD WITHOUT CONTRAST  TECHNIQUE: Contiguous axial images were obtained from the base of the skull through the vertex without intravenous contrast.  COMPARISON:  Head CT 12/25/2003.  MRI brain 03/24/2013.  FINDINGS: There is no evidence of acute intracranial hemorrhage, mass lesion, brain edema or extra-axial fluid collection. The ventricles and subarachnoid spaces are mildly prominent but stable. Extensive chronic small vessel ischemic changes and old cortical infarcts are again noted bilaterally. There  is increased low-density and gyral swelling in the left frontotemporal region (images 13-16) which could reflect a subacute infarct. Intracranial vascular calcifications again noted.  The visualized paranasal sinuses, mastoid air cells and middle ears are clear. The calvarium is intact.  IMPRESSION: Extensive chronic ischemic changes bilaterally with possible subacute extension in the left frontotemporal region. No evidence of acute intracranial hemorrhage.   Electronically Signed   By: Roxy Horseman M.D.   On: 04/01/2014 20:55   Mr Brain Wo Contrast  04/02/2014    CLINICAL DATA:  Recent hospitalization for stroke, found noncommunicating and shivering today.  EXAM: MRI HEAD WITHOUT CONTRAST  MRA HEAD WITHOUT CONTRAST  TECHNIQUE: Multiplanar, multiecho pulse sequences of the brain and surrounding structures were obtained without intravenous contrast. Angiographic images of the head were obtained using MRA technique without contrast.  COMPARISON:  CT of the head March 24, 2013 and CT of the head April 01, 2014  FINDINGS: MRI HEAD FINDINGS  Moderately motion degraded examination. Reduced diffusion in LEFT frontal lobe, spanning greater territory than on prior MRI. Corresponding low ADC values. Multiple subcentimeter central with a lesser extent peripheral foci of susceptibility artifact, increased from prior examination though, in a similar distribution.  Moderate ventriculomegaly, likely on the basis of global parenchymal brain volume loss as there is overall commensurate enlargement of cerebral sulci and cerebellar folia. Bilateral small cerebellar infarcts, relatively unchanged. Bilateral occipital lobe encephalomalacia, advanced on the RIGHT, new on the LEFT. Severe confluent supratentorial white matter FLAIR T2 hyperintensities without mass effect. Cystic encephalomalacia of the LEFT frontal lobe underlying the area of T2 bright cytotoxic edema. Bilateral basal ganglia and thalamus lacunar infarcts.  No abnormal extra-axial fluid collections. Status post bilateral ocular lens implants. RIGHT maxillary mucosal retention cyst without paranasal sinus air-fluid levels. The mastoid air cells are well aerated. Mild sellar expansion, can be seen with arachnoid cyst or less likely empty sella considering a thin rind of apparent pituitary tissue along the floor. No cerebellar tonsillar ectopia. No suspicious calvarial bone marrow signal.  MRA HEAD FINDINGS  Moderately motion degraded examination.  Anterior circulation: Flow related enhancement observed within the cervical,  petrous, cavernous and supra clinoid internal carotid arteries. Due to motion, there is a duplicated appearance of the carotid siphons. Flow related enhancement within the anterior cerebral arteries, robust flow related enhancement of the RIGHT middle cerebral artery, somewhat less robust flow related enhancement of LEFT middle cerebral artery without focal conclusion.  Posterior circulation: LEFT vertebral artery is dominant. Very poor visualization of the proximal basilar artery, attributed to motion and tortuosity, with normal flow related enhancement of basilar tip. Normal flow early enhancement of the posterior cerebral arteries.  Dolichoectatic appearance intracranial vessels without large vessel occlusion. Limited assessment for aneurysm or vasculopathy due to the degree of motion.  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  Susceptibility artifact in a pattern suggesting sequela of chronic hypertension with severe white matter changes which may be seen with chronic small vessel ischemic disease. Bilateral basal ganglia and thalamus lacunar infarcts.  Bifrontal encephalomalacia, further propagation on the RIGHT, new on the LEFT consistent with interval bilateral posterior cerebral artery territory infarcts.  MRA HEAD: Moderately motion degraded examination, no large vessel occlusion. Slightly decreased flow related enhancement of the LEFT middle cerebral artery. Findings may be better characterized on CTA of the head as patient had difficulty remaining still for the examination. In addition, poor visualization of the proximal basilar artery which is  likely artifact though could be confirmed on CTA of the head.  Dolicoectatic intracranial vessels can be seen with chronic hypertension.   Electronically Signed   By: Awilda Metro   On: 04/02/2014 06:15   Dg Chest Port 1 View  04/01/2014   CLINICAL DATA:  Altered mental status with weakness.  Possible stroke. Initial encounter.  EXAM: PORTABLE CHEST - 1 VIEW  COMPARISON:  12/24/2013.  FINDINGS: 1812 hr. There is stable cardiomegaly and vascular ectasia. The lungs are clear. There is no pleural effusion or pneumothorax. No acute osseous findings are seen. Multiple telemetry leads overlie the chest.  IMPRESSION: Stable cardiomegaly.  No acute cardiopulmonary process demonstrated.   Electronically Signed   By: Roxy Horseman M.D.   On: 04/01/2014 18:41   Mr Maxine Glenn Head/brain Wo Cm  04/02/2014   CLINICAL DATA:  Recent hospitalization for stroke, found noncommunicating and shivering today.  EXAM: MRI HEAD WITHOUT CONTRAST  MRA HEAD WITHOUT CONTRAST  TECHNIQUE: Multiplanar, multiecho pulse sequences of the brain and surrounding structures were obtained without intravenous contrast. Angiographic images of the head were obtained using MRA technique without contrast.  COMPARISON:  CT of the head March 24, 2013 and CT of the head April 01, 2014  FINDINGS: MRI HEAD FINDINGS  Moderately motion degraded examination. Reduced diffusion in LEFT frontal lobe, spanning greater territory than on prior MRI. Corresponding low ADC values. Multiple subcentimeter central with a lesser extent peripheral foci of susceptibility artifact, increased from prior examination though, in a similar distribution.  Moderate ventriculomegaly, likely on the basis of global parenchymal brain volume loss as there is overall commensurate enlargement of cerebral sulci and cerebellar folia. Bilateral small cerebellar infarcts, relatively unchanged. Bilateral occipital lobe encephalomalacia, advanced on the RIGHT, new on the LEFT. Severe confluent supratentorial white matter FLAIR T2 hyperintensities without mass effect. Cystic encephalomalacia of the LEFT frontal lobe underlying the area of T2 bright cytotoxic edema. Bilateral basal ganglia and thalamus lacunar infarcts.  No abnormal extra-axial fluid collections. Status post bilateral ocular  lens implants. RIGHT maxillary mucosal retention cyst without paranasal sinus air-fluid levels. The mastoid air cells are well aerated. Mild sellar expansion, can be seen with arachnoid cyst or less likely empty sella considering a thin rind of apparent pituitary tissue along the floor. No cerebellar tonsillar ectopia. No suspicious calvarial bone marrow signal.  MRA HEAD FINDINGS  Moderately motion degraded examination.  Anterior circulation: Flow related enhancement observed within the cervical, petrous, cavernous and supra clinoid internal carotid arteries. Due to motion, there is a duplicated appearance of the carotid siphons. Flow related enhancement within the anterior cerebral arteries, robust flow related enhancement of the RIGHT middle cerebral artery, somewhat less robust flow related enhancement of LEFT middle cerebral artery without focal conclusion.  Posterior circulation: LEFT vertebral artery is dominant. Very poor visualization of the proximal basilar artery, attributed to motion and tortuosity, with normal flow related enhancement of basilar tip. Normal flow early enhancement of the posterior cerebral arteries.  Dolichoectatic appearance intracranial vessels without large vessel occlusion. Limited assessment for aneurysm or vasculopathy due to the degree of motion.  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  Susceptibility artifact in a pattern suggesting sequela of chronic hypertension with severe white matter changes which may be seen with chronic small vessel ischemic disease. Bilateral basal ganglia and thalamus lacunar infarcts.  Bifrontal encephalomalacia, further propagation on the RIGHT, new on the LEFT consistent with interval bilateral posterior cerebral artery territory infarcts.  MRA HEAD: Moderately motion degraded examination, no large vessel occlusion. Slightly decreased flow related enhancement of the LEFT middle cerebral  artery. Findings may be better characterized on CTA of the head as patient had difficulty remaining still for the examination. In addition, poor visualization of the proximal basilar artery which is likely artifact though could be confirmed on CTA of the head.  Dolicoectatic intracranial vessels can be seen with chronic hypertension.   Electronically Signed   By: Awilda Metro   On: 04/02/2014 06:15    Medications: I have reviewed the patient's current medications. Scheduled Meds: . antiseptic oral rinse  7 mL Mouth Rinse q12n4p  . aspirin  300 mg Rectal Daily  . atorvastatin  40 mg Oral q1800  . chlorhexidine  15 mL Mouth Rinse BID  . escitalopram  5 mg Oral Daily  . furosemide  40 mg Intravenous BID  . loratadine  10 mg Oral Daily  . metoprolol succinate  25 mg Oral Daily  . multivitamin with minerals  1 tablet Oral Daily  . rivaroxaban  20 mg Oral QAC supper   Continuous Infusions: . 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  She is aphasic (not able to communicate) and weak, R>L. CT showed subacute left frontotemporal infarct. Patient also was tachypneic with oxygen saturation in 80s on room air, now on Bipap. Troponin was mildly elevated at 0.25, so cardiology was consulted.   1. CVA: Per neurology. She has a history of paroxysmal atrial fibrillation and prior CVA. She has been on Xarelto, but unable to tell me whether she has been taking it compliantly. If she has been taking it regularly, would consider eventual transition to Eliquis 5 mg bid. She does not have bleeding on her CT, so likely can continue her anticoagulation.   2. Atrial fibrillation: Paroxysmal. Currently in NSR. As above, if she was compliant with Xarelto, would  consider eventual transition to Eliquis.   3. 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 She is requiring Bipap due to hypoxia. She has JVD and peripheral edema, so suspect a component of CHF.  - Start Lasix 40 mg IV bid, 1st dose yesterday. -1105, no weights noted will add. - BNP  2537, follow BMET--Cr stable.at 0.97.   Echo has been done , results P  4. Elevated troponin: Mild elevation at 0.25 and has stayed in that range. This may be demand ischemia with acute on chronic diastolic CHF and hypoxia, may also be related to CVA (can see mild rise in troponin with CVA in absence of plaque rupture). ECG is abnormal but not changed from prior. Cannot tell me whether or not she has chest pain. Of note, Fabry's does increase the risk of both epicardial and small vessel CAD.  - Cycle troponin to peak. --> 0.28 today - Would maintain her on a statin. Lipitor 40 - She is on Xarelto.      LOS: 1 day   Time spent with pt. :15 minutes. [facility]  Nurse Practitioner Certified Pager [phone] or after 5pm and on weekends call [phone] 04/02/2014, 11:10 AM  I have personally seen and examined patient and agree with note as outlined by  Nada Boozer NP.  Patient admitted with acute CVA and loop recorder noted PAF.  She has been on Xarelto and BB at home but unclear whether she has been compliant. Patient is aphasic and cannot respond to questions.  Currently NPO and Neuro placed patient on ASA PR for now and hopefully will start Xarelto tomorrow. Currently in NSR with a 5 beat run of WCT.  EF normal on echo.  If she has been compliant on Xarelto would consider changing to Eliquis which has a lower bleeding risk but if not compliant would not change to a BID dosing med.  Will place her on Lopressor 5mg  q6 hours while  unable to take PO.  Lungs are clear but has some residual LE edema after IV lasix.  IVF running at 75cc/hr - will change to [facility].  Signed: Armanda Magic, MD [facility] 04/02/2014

## 2014-04-02 NOTE — Progress Notes (Signed)
PT Cancellation Note  Patient Details Name: LADENA PENA MRN: [id number] DOB: 20-Nov-1945   Cancelled Treatment:    Reason Eval/Treat Not Completed: Patient not medically ready.  Pt currently with bedrest orders and Stroke Team note indicates lethargy.  Please update activity orders and advise when pt appropriate for mobility and therapy.     Estrella Alcaraz, Alison Murray 04/02/2014, 11:20 AM

## 2014-04-02 NOTE — Progress Notes (Signed)
UR completed.  Ishika Chesterfield, RN BSN MHA CCM Trauma/Neuro ICU Case Manager [phone]  

## 2014-04-02 NOTE — Progress Notes (Signed)
[facility] TEAM 1 - Stepdown/ICU TEAM Progress Note  Rebecca [name] RZN:[id number] DOB: [birth year] DOA: 04/01/2014 PCP: Burtis Junes, MD  Admit HPI / Brief Narrative: Rebecca [name] is a 69 y.o.BF PMHx Depression, hypertension, atrial fibrillation on Xarelto, Fabry disease, tobacco abuse, diabetes mellitus Type 2 uncontrolled, stroke, who presents with aphasia.  Patient was recently hospitalized because of stroke. He was discharged on Xeralto for Afib. At that time she was noted to have expressive aphasia and mild weakness on the right side. Patient was brought to hospital today after her son found patient leaning on the counter "slumped over and not communicating and was shivering in all 4 extremities".Patiwent was found to have oxygen desaturation to 88% on room air, which improved to 100% after placed on BIPAP. When I evaluated the patient in ED, she was awake, but is nonverbal. She only followed some simple commands. It is difficult to assess whether patient has any pain.   Work up in the ED demonstrates CT-head no bleeding. Chest x-rays negative.No leukocytosis. Her EKG showed PVC with T-wave inversion in lead 1. Trop is elevated at 0.25. Patient is admitted to inpatient for further evaluation and treatment. Neurology and cardiology were consulted by ED.   HPI/Subjective: 1/14 A/O 0 patient will open her eyes to painful stimuli, moves all extremities to painful stimuli, follows no commands.  Assessment/Plan: Expressive aphagia: CT head is negative for intracranial bleeding. Neurology was consulted, concerning for left brain CVA versus possible seizure. -will admit to SDU -follow up Neuro's recommendations as follows: -HgbA1c, fasting lipid panel pending- -PT consult, OT consult, Speech consult pending - Carotid dopplers pending -Frequent neuro checks -NPO until passes stroke swallow screen  HTN:  -In the setting of acute stroke maintain SBP 145-165 will hold  lisinopril, HCTZ in the setting of possible acute stroke. -Continue metoprolol IV 5 mg QID -Discontinue furosemide  Elevated troponin: Troponin 0.25. Difficult to assess whether patient has chest pain. Cardiology was consulted. Dr. Shirlee Latch evaluated the patient, and thought this may be demand ischemia with acute on chronic diastolic CHF and hypoxia, may also be related to CVA. -Trop x 3 - ASA and lipitor,  -continue metoprolol -Continue Xarelto.   Acute on chronic combined systolic and diastolic CHF:  -Per Dr. 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 -Was requiring Bipap due to hypoxia.  -She has JVD and peripheral edema, so Dr. Shirlee Latch suspects patient has component of CHF.  - Discontinue Lasix secondary to patient's acute stroke, and soft BP for acute stroke.In the setting of acute stroke maintain SBP 145-165 - Check BNP, follow BMET.  -Strict in and out since admission -1.5 L -Daily a.m. weight question mark?  Pulmonary hypertension -See HTN  Afib:  -Currently in NSR -Heart rate is well controlled.  -Continue metoprolol IV 5 mg QID -Continue home dose Xarelto  Diabetes type 2 controlled -Place on sensitive SSI -Obtain hemoglobin A1c  Hyperlipidemia:  -LDL was 104 on 03/24/13. -Continue Lipitor 40 mg daily  Fabry disease:  -Agalsidase 1 mg/kilogram q 14 days. Last dose some time last week.     Code Status: FULL Family Communication: no family present at time of exam Disposition Plan: Per neurology; SNF    Consultants: Dr Georga Hacking Camilo,(Neurology) Dr. Delia Heady (neurology, stroke) Dr. Marca Ancona (cardiology )    Procedure/Significant Events: 1/14 MRI/MRA Brain without contrast;- Acute on  chronic LEFT frontal lobe infarct (middle cerebral artery territory). - Bilateral basal ganglia and  thalamus lacunar infarcts. -Bifrontal encephalomalacia, further propagation on the RIGHT, new on the LEFT consistent with interval bilateral posterior cerebral artery territory infarcts. -no large vessel occlusion. Slightly decreased flow related enhancement of the LEFT middle cerebral artery.  1/14 EEG: normal asleep EEG. No electrographic seizures noted 1/14 echocardiogram;- Left ventricle: severeconcentric hypertrophy. -LVEF=45% to 50%.  -(grade 2 diastolic dysfunction). - Aortic valve:Moderate regurgitation. - Left atrium: severely dilated. -Right atrium: moderately dilated. -Pulmonary arteries: PApeak pressure: 42 mm Hg (S).   Culture NA  Antibiotics: NA  DVT prophylaxis: Xarelto   Devices    LINES / TUBES:      Continuous Infusions: . sodium chloride 75 mL/hr at 04/02/14 0700    Objective: VITAL SIGNS: Temp: 98.5 F (36.9 C) (01/14 1122) Temp Source: Oral (01/14 1122) BP: 132/79 mmHg (01/14 1100) Pulse Rate: 69 (01/14 1100) SPO2; FIO2:   Intake/Output Summary (Last 24 hours) at 04/02/14 1435 Last data filed at 04/02/14 1200  Gross per 24 hour  Intake    570 ml  Output   2600 ml  Net  -2030 ml     Exam: General: A/O 0, does not respond to commands, will open eyes with stimulation, No acute respiratory distress Lungs: Clear to auscultation bilaterally without wheezes or crackles Cardiovascular: Regular rate and rhythm without murmur gallop or rub normal S1 and S2 Abdomen: Nontender, nondistended, soft, bowel sounds positive, no rebound, no ascites, no appreciable mass Extremities: No significant cyanosis, clubbing, or edema bilateral lower extremities Neurologic; cranial nerves II through XII intact?, moves all extremities with painful stimuli, opens eyes will not track objects.  Data Reviewed: Basic Metabolic Panel:  Recent Labs Lab 04/01/14 1745 04/02/14 0420  NA 143 145  K 4.3 3.9  CL 105 109  CO2 19 23  GLUCOSE 130* 149*  BUN 21  19  CREATININE 1.04 0.97  CALCIUM 9.6 9.5   Liver Function Tests:  Recent Labs Lab 04/01/14 1745  AST 38*  ALT 15  ALKPHOS 36*  BILITOT 0.9  PROT 7.5  ALBUMIN 3.9   No results for input(s): LIPASE, AMYLASE in the last 168 hours. No results for input(s): AMMONIA in the last 168 hours. CBC:  Recent Labs Lab 04/01/14 1745  WBC 5.4  NEUTROABS 3.4  HGB 12.7  HCT 38.6  MCV 83.9  PLT 181   Cardiac Enzymes:  Recent Labs Lab 04/01/14 1745 04/01/14 2210 04/02/14 0420 04/02/14 1000  TROPONINI 0.25* 0.27* 0.28* 0.28*   BNP (last 3 results) No results for input(s): PROBNP in the last 8760 hours. CBG:  Recent Labs Lab 04/02/14 0035 04/02/14 0334 04/02/14 0746 04/02/14 1121  GLUCAP 142* 154* 129* 107*    Recent Results (from the past 240 hour(s))  MRSA PCR Screening     Status: None   Collection Time: 04/02/14  1:17 AM  Result Value Ref Range Status   MRSA by PCR NEGATIVE NEGATIVE Final    Comment:        The GeneXpert MRSA Assay (FDA approved for NASAL specimens only), is one component of a comprehensive MRSA colonization surveillance program. It is not intended to diagnose MRSA infection nor to guide or monitor treatment for MRSA infections.      Studies:  Recent x-ray studies have been reviewed in detail by the Attending Physician  Scheduled Meds:  Scheduled Meds: . antiseptic oral rinse  7 mL Mouth Rinse q12n4p  . aspirin  300 mg Rectal Daily  . atorvastatin  40 mg Oral q1800  . chlorhexidine  15 mL Mouth Rinse BID  . escitalopram  5 mg Oral Daily  . furosemide  40 mg Intravenous BID  . loratadine  10 mg Oral Daily  . metoprolol  5 mg Intravenous 4 times per day  . multivitamin with minerals  1 tablet Oral Daily  . rivaroxaban  20 mg Oral QAC supper    Time spent on care of this patient: 40 mins   Drema Dallas , MD   [facility] Office  [phone] Pager [phone]  On-Call/Text Page:      [email or web address]       password [email or web address]  If 7PM-7AM, please contact night-coverage [email or web address] Password [email or web address] 04/02/2014, 2:35 PM   LOS: 1 day

## 2014-04-02 NOTE — Progress Notes (Signed)
STROKE TEAM PROGRESS NOTE   HISTORY Rebecca [name] is an 69 y.o. female Who was recently In hospital with stroke and placed on Xeralto at that time for Afib. Stroke work up at that time as below. At that time she was noted to have expressive aphasia and mild weakness on the right side. Patient was brought to hospital today 04/01/2014 after her son came home and found her leaning on the counter "slumped over and not communicating. She also was shivering in all 4 extremities". She was brought to ED where she was noted to have respiratory compromise and placed on BIPAP. she was LKW earlier in the day at 0830a.  In the ED she is awake but not conversive, will follow only simple commands and more notably on the left side. Winces to pain on the left greater than right.  Per son her baseline was --walking with a cane, able to converse but had difficulty with exp[ressing herself. No right sided weakness.  Patient was not administered TPA secondary to being on xarelto. She was admitted for further evaluation and treatment.    SUBJECTIVE (INTERVAL HISTORY) Her RN and echo tech are at the bedside.   No family present. She remains quite sleepy and hard to arouse and aphasic. No witnessed seizure activity.   OBJECTIVE Temp:  [98.1 F (36.7 C)-98.5 F (36.9 C)] 98.1 F (36.7 C) (01/14 0400) Pulse Rate:  [54-88] 54 (01/14 0700) Cardiac Rhythm:  [-] Normal sinus rhythm (01/14 0533) Resp:  [11-33] 16 (01/14 0700) BP: (136-188)/(77-121) 154/91 mmHg (01/14 0600) SpO2:  [88 %-100 %] 100 % (01/14 0700) FiO2 (%):  [50 %] 50 % (01/14 0400)   Recent Labs Lab 04/02/14 0035  GLUCAP 142*    Recent Labs Lab 04/01/14 1745 04/02/14 0420  NA 143 145  K 4.3 3.9  CL 105 109  CO2 19 23  GLUCOSE 130* 149*  BUN 21 19  CREATININE 1.04 0.97  CALCIUM 9.6 9.5    Recent Labs Lab 04/01/14 1745  AST 38*  ALT 15  ALKPHOS 36*  BILITOT 0.9  PROT 7.5  ALBUMIN 3.9    Recent Labs Lab 04/01/14 1745   WBC 5.4  NEUTROABS 3.4  HGB 12.7  HCT 38.6  MCV 83.9  PLT 181    Recent Labs Lab 04/01/14 1745 04/01/14 2210 04/02/14 0420  TROPONINI 0.25* 0.27* 0.28*   No results for input(s): LABPROT, INR in the last 72 hours.  Recent Labs  04/01/14 1843  COLORURINE YELLOW  LABSPEC 1.021  PHURINE 5.0  GLUCOSEU NEGATIVE  HGBUR NEGATIVE  BILIRUBINUR NEGATIVE  KETONESUR NEGATIVE  PROTEINUR 100*  UROBILINOGEN 0.2  NITRITE NEGATIVE  LEUKOCYTESUR NEGATIVE       Component Value Date/Time   CHOL 177 04/02/2014 0420   TRIG 50 04/02/2014 0420   HDL 71 04/02/2014 0420   CHOLHDL 2.5 04/02/2014 0420   VLDL 10 04/02/2014 0420   LDLCALC 96 04/02/2014 0420   Lab Results  Component Value Date   HGBA1C 6.2* 03/24/2013      Component Value Date/Time   LABOPIA NONE DETECTED 04/02/2014 0130   COCAINSCRNUR NONE DETECTED 04/02/2014 0130   LABBENZ NONE DETECTED 04/02/2014 0130   AMPHETMU NONE DETECTED 04/02/2014 0130   THCU NONE DETECTED 04/02/2014 0130   LABBARB NONE DETECTED 04/02/2014 0130     Recent Labs Lab 04/01/14 1745  ETH <5    Dg Chest 2 View  04/02/2014   CLINICAL DATA:  Shortness of breath, and aphasia  EXAM:  CHEST  2 VIEW  COMPARISON:  Portable chest x-ray of April 01, 2014  FINDINGS: The lungs are well-expanded and clear. The cardiopericardial silhouette is enlarged. The pulmonary vascularity is mildly prominent centrally without cephalization. There is no pulmonary edema. There is tortuosity of the descending thoracic aorta. The bony thorax exhibits no acute abnormalities.  IMPRESSION: Cardiomegaly without pulmonary edema. There is no pneumonia. Stable appearance of the chest since yesterday's study.   Electronically Signed   By: David  [place]   On: 04/02/2014 07:38   Ct Head Wo Contrast  04/01/2014   CLINICAL DATA:  Unresponsive tonight.  Initial encounter.  EXAM: CT HEAD WITHOUT CONTRAST  TECHNIQUE: Contiguous axial images were obtained from the base of the skull  through the vertex without intravenous contrast.  COMPARISON:  Head CT 12/25/2003.  MRI brain 03/24/2013.  FINDINGS: There is no evidence of acute intracranial hemorrhage, mass lesion, brain edema or extra-axial fluid collection. The ventricles and subarachnoid spaces are mildly prominent but stable. Extensive chronic small vessel ischemic changes and old cortical infarcts are again noted bilaterally. There is increased low-density and gyral swelling in the left frontotemporal region (images 13-16) which could reflect a subacute infarct. Intracranial vascular calcifications again noted.  The visualized paranasal sinuses, mastoid air cells and middle ears are clear. The calvarium is intact.  IMPRESSION: Extensive chronic ischemic changes bilaterally with possible subacute extension in the left frontotemporal region. No evidence of acute intracranial hemorrhage.   Electronically Signed   By: Roxy Horseman M.D.   On: 04/01/2014 20:55   Mr Brain Wo Contrast  04/02/2014   CLINICAL DATA:  Recent hospitalization for stroke, found noncommunicating and shivering today.  EXAM: MRI HEAD WITHOUT CONTRAST  MRA HEAD WITHOUT CONTRAST  TECHNIQUE: Multiplanar, multiecho pulse sequences of the brain and surrounding structures were obtained without intravenous contrast. Angiographic images of the head were obtained using MRA technique without contrast.  COMPARISON:  CT of the head March 24, 2013 and CT of the head April 01, 2014  FINDINGS: MRI HEAD FINDINGS  Moderately motion degraded examination. Reduced diffusion in LEFT frontal lobe, spanning greater territory than on prior MRI. Corresponding low ADC values. Multiple subcentimeter central with a lesser extent peripheral foci of susceptibility artifact, increased from prior examination though, in a similar distribution.  Moderate ventriculomegaly, likely on the basis of global parenchymal brain volume loss as there is overall commensurate enlargement of cerebral sulci and  cerebellar folia. Bilateral small cerebellar infarcts, relatively unchanged. Bilateral occipital lobe encephalomalacia, advanced on the RIGHT, new on the LEFT. Severe confluent supratentorial white matter FLAIR T2 hyperintensities without mass effect. Cystic encephalomalacia of the LEFT frontal lobe underlying the area of T2 bright cytotoxic edema. Bilateral basal ganglia and thalamus lacunar infarcts.  No abnormal extra-axial fluid collections. Status post bilateral ocular lens implants. RIGHT maxillary mucosal retention cyst without paranasal sinus air-fluid levels. The mastoid air cells are well aerated. Mild sellar expansion, can be seen with arachnoid cyst or less likely empty sella considering a thin rind of apparent pituitary tissue along the floor. No cerebellar tonsillar ectopia. No suspicious calvarial bone marrow signal.  MRA HEAD FINDINGS  Moderately motion degraded examination.  Anterior circulation: Flow related enhancement observed within the cervical, petrous, cavernous and supra clinoid internal carotid arteries. Due to motion, there is a duplicated appearance of the carotid siphons. Flow related enhancement within the anterior cerebral arteries, robust flow related enhancement of the RIGHT middle cerebral artery, somewhat less robust flow related enhancement of LEFT middle  cerebral artery without focal conclusion.  Posterior circulation: LEFT vertebral artery is dominant. Very poor visualization of the proximal basilar artery, attributed to motion and tortuosity, with normal flow related enhancement of basilar tip. Normal flow early enhancement of the posterior cerebral arteries.  Dolichoectatic appearance intracranial vessels without large vessel occlusion. Limited assessment for aneurysm or vasculopathy due to the degree of motion.  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  Susceptibility artifact in a pattern suggesting sequela of  chronic hypertension with severe white matter changes which may be seen with chronic small vessel ischemic disease. Bilateral basal ganglia and thalamus lacunar infarcts.  Bifrontal encephalomalacia, further propagation on the RIGHT, new on the LEFT consistent with interval bilateral posterior cerebral artery territory infarcts.  MRA HEAD: Moderately motion degraded examination, no large vessel occlusion. Slightly decreased flow related enhancement of the LEFT middle cerebral artery. Findings may be better characterized on CTA of the head as patient had difficulty remaining still for the examination. In addition, poor visualization of the proximal basilar artery which is likely artifact though could be confirmed on CTA of the head.  Dolicoectatic intracranial vessels can be seen with chronic hypertension.   Electronically Signed   By: Awilda Metro   On: 04/02/2014 06:15   Dg Chest Port 1 View  04/01/2014   CLINICAL DATA:  Altered mental status with weakness. Possible stroke. Initial encounter.  EXAM: PORTABLE CHEST - 1 VIEW  COMPARISON:  12/24/2013.  FINDINGS: 1812 hr. There is stable cardiomegaly and vascular ectasia. The lungs are clear. There is no pleural effusion or pneumothorax. No acute osseous findings are seen. Multiple telemetry leads overlie the chest.  IMPRESSION: Stable cardiomegaly.  No acute cardiopulmonary process demonstrated.   Electronically Signed   By: Roxy Horseman M.D.   On: 04/01/2014 18:41   Mr Maxine Glenn Head/brain Wo Cm  04/02/2014   CLINICAL DATA:  Recent hospitalization for stroke, found noncommunicating and shivering today.  EXAM: MRI HEAD WITHOUT CONTRAST  MRA HEAD WITHOUT CONTRAST  TECHNIQUE: Multiplanar, multiecho pulse sequences of the brain and surrounding structures were obtained without intravenous contrast. Angiographic images of the head were obtained using MRA technique without contrast.  COMPARISON:  CT of the head March 24, 2013 and CT of the head April 01, 2014   FINDINGS: MRI HEAD FINDINGS  Moderately motion degraded examination. Reduced diffusion in LEFT frontal lobe, spanning greater territory than on prior MRI. Corresponding low ADC values. Multiple subcentimeter central with a lesser extent peripheral foci of susceptibility artifact, increased from prior examination though, in a similar distribution.  Moderate ventriculomegaly, likely on the basis of global parenchymal brain volume loss as there is overall commensurate enlargement of cerebral sulci and cerebellar folia. Bilateral small cerebellar infarcts, relatively unchanged. Bilateral occipital lobe encephalomalacia, advanced on the RIGHT, new on the LEFT. Severe confluent supratentorial white matter FLAIR T2 hyperintensities without mass effect. Cystic encephalomalacia of the LEFT frontal lobe underlying the area of T2 bright cytotoxic edema. Bilateral basal ganglia and thalamus lacunar infarcts.  No abnormal extra-axial fluid collections. Status post bilateral ocular lens implants. RIGHT maxillary mucosal retention cyst without paranasal sinus air-fluid levels. The mastoid air cells are well aerated. Mild sellar expansion, can be seen with arachnoid cyst or less likely empty sella considering a thin rind of apparent pituitary tissue along the floor. No cerebellar tonsillar ectopia. No suspicious calvarial bone marrow signal.  MRA HEAD FINDINGS  Moderately motion degraded examination.  Anterior circulation: Flow related  enhancement observed within the cervical, petrous, cavernous and supra clinoid internal carotid arteries. Due to motion, there is a duplicated appearance of the carotid siphons. Flow related enhancement within the anterior cerebral arteries, robust flow related enhancement of the RIGHT middle cerebral artery, somewhat less robust flow related enhancement of LEFT middle cerebral artery without focal conclusion.  Posterior circulation: LEFT vertebral artery is dominant. Very poor visualization of the  proximal basilar artery, attributed to motion and tortuosity, with normal flow related enhancement of basilar tip. Normal flow early enhancement of the posterior cerebral arteries.  Dolichoectatic appearance intracranial vessels without large vessel occlusion. Limited assessment for aneurysm or vasculopathy due to the degree of motion.  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  Susceptibility artifact in a pattern suggesting sequela of chronic hypertension with severe white matter changes which may be seen with chronic small vessel ischemic disease. Bilateral basal ganglia and thalamus lacunar infarcts.  Bifrontal encephalomalacia, further propagation on the RIGHT, new on the LEFT consistent with interval bilateral posterior cerebral artery territory infarcts.  MRA HEAD: Moderately motion degraded examination, no large vessel occlusion. Slightly decreased flow related enhancement of the LEFT middle cerebral artery. Findings may be better characterized on CTA of the head as patient had difficulty remaining still for the examination. In addition, poor visualization of the proximal basilar artery which is likely artifact though could be confirmed on CTA of the head.  Dolicoectatic intracranial vessels can be seen with chronic hypertension.   Electronically Signed   By: Awilda Metro   On: 04/02/2014 06:15    PHYSICAL EXAM Elderly african american lady not in distress.Awake alert. Afebrile. Head is nontraumatic. Neck is supple without bruit. Hearing is normal. Cardiac exam no murmur or gallop. Lungs are clear to auscultation. Distal pulses are well felt. Neurological Exam : Drowsy but can be aroused. Globally Aphasic and mute not speaking. Follows only occasional midline and few simple commands. Right gaze preference but able to look to the left past midline. Blinks to threat more on the left than the right. No facial weakness. Tongue midline. Able to  move all 4 extremities well against gravity without focal weakness noted. Deep tendon reflexes are 2+ symmetric. Plantars downgoing. Sensation appears preserved bilaterally. Coordination and gait cannot be tested reliably.   ASSESSMENT/PLAN [name] is a 69 y.o. female with history of hypertension, atrial fibrillation on Xarelto, Fabry disease, hx of tobacco abuse, diabetes mellitus, recent stroke and  depression presenting with aphasia. She did not receive IV t-PA due to being on anticoagulation.   Stroke:  Relatively large left frontal branch infarct,  Embolic secondary to known atrial fibrillation on xarelto  Resultant  Lethargy out of proportion to size of stroke, global aphasia, right hemianopsia   MRI  Relatively large new Left frontal infarct in setting of old left frontal, old bilateral basal banglia lacunes  MRA  Decreased flow L MCA  Carotid Doppler  pending   2D Echo  pending   HgbA1c pending  EEG pending. Concerned d/t increased lethargy  SCDs for VTE prophylaxis  Diet NPO time specified . Failed swallow eval d/t lethargy.  aspirin 81 mg orally every day and xarelto ( rivaroxaban) prior to admission, continued in hospital. Due to NPO status, changed to aspirin 300 mg suppository daily  Ongoing aggressive stroke risk factor management  Complete stroke work up  Therapy recommendations:  pending   Disposition:  pending   Atrial Fibrillation  Found  via loop recorder placed post stroke  Home meds:  Xarelto and aspirin, continued in the hospital, though pt unable to swallow. Changed to asa suppository for now. Once able to swallow, will change back   Hypertension  BP 133-188/71-109 past 24h (04/02/2014 @ 9:12 AM)  Permissive hypertension (OK if < 220/120) but gradually normalize in 5-7 days  Hyperlipidemia  Home meds:  lipitor 40, resumed in hospital though pt currently unable to swallow  LDL 96, goal < 70  Diabetes  HgbA1c pending  goal <  7.0  Other Stroke Risk Factors  Fabry's disease  Advanced age  Hx stroke/TIA - 1/15 left MCA CVA with aphasia, symptoms mostly resolved  Other Active Problems  Respiratory insuffiencey. On bipap at admission.  Depression   Elevated troponin: Mild elevation at 0.25. demand ischemia with acute on chronic diastolic CHF and hypoxia, may also be related to CVA. 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  Asymptomatic VT, 8 beats x 2 this am  Hospital day # 1  Rhoderick Moody [facility] See Amion for Pager information 04/02/2014 9:02 AM  I have personally examined this patient, reviewed notes, independently viewed imaging studies, participated in medical decision making and plan of care. I have made any additions or clarifications directly to the above note. Agree with note above. Mr. Caris has an embolic left middle cerebral artery infarct with significant residual aphasia. Etiology likely cardioembolic from known history of atrial fibrillation. Patient appears more drowsy than the size of her stroke hence we will check EEG for silent seizure activity. Interestingly she also has history of Fabry`s disease which is also on known to cause strokes and a younger age  Delia Heady, MD Medical Director [facility] Pager: [phone] 04/02/2014 12:39 PM   To contact Stroke Continuity provider, please refer to [email or web address]. After hours, contact General Neurology

## 2014-04-02 NOTE — Procedures (Signed)
EEG report.  Brief clinical history:  69 y.o. female with a past medical history of hypertension, atrial fibrillation on Xarelto, Fabry disease, hx of tobacco abuse, diabetes mellitus, stroke, depression, who presents with aphasia  Technique: this is a 17 channel routine scalp EEG performed at the bedside with bipolar and monopolar montages arranged in accordance to the international 10/20 system of electrode placement. One channel was dedicated to EKG recording.  No activating procedures.  Description: as the study begins and throughout the entire recording, there is evidence of intermixed intermittent and diffuse alpha frequencies and sleep pattern.  No focal or generalized epileptiform discharges noted.  EKG showed sinus rhythm.  Impression: this is a normal asleep EEG. No electrographic seizures noted. Please, be aware that a normal EEG does not exclude the possibility of epilepsy.  Clinical correlation is advised.   Wyatt Portela, MD

## 2014-04-02 NOTE — Evaluation (Signed)
Clinical/Bedside Swallow Evaluation Patient Details  Name: Rebecca [name] MRN: [id number] Date of Birth: 22-Feb-1946  Today's Date: 04/02/2014 Time: 1424-1430 SLP Time Calculation (min) (ACUTE ONLY): 6 min  Past Medical History:  Past Medical History  Diagnosis Date  . Hypertension   . 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 Expressive aphasia 03/23/2013  . Atrial fibrillation 05/01/2013  . Tobacco abuse 05/01/2013  . Diabetes mellitus without complication   . Depression   . Stroke     2015   Past Surgical History:  Past Surgical History  Procedure Laterality Date  . Tee without cardioversion N/A 03/26/2013    Procedure: TRANSESOPHAGEAL ECHOCARDIOGRAM (TEE);  Surgeon: Thurmon Fair, MD;  Location: [facility] ENDOSCOPY;  Service: Cardiovascular;  Laterality: N/A;  . Cesarean section      x3  . Hip replacement Right 1990's  . Hip replacemet Left 1990's   HPI:  69 y.o. 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 MRI Acute on chronic LEFT frontal lobe infarct (middle cerebral artery territory. Recently hospitalized with stroke with expressive aphasia and mild weakness on the right side. Failed RN stroke swallow scren.   Assessment / Plan / Recommendation Clinical Impression  Pt lethargic but awakened to verbal and tactile stimuli with frequent arousal required. Unable to follow oral-motor commands due to baseline aphasia. No attempts to manipulate ice cube or applesauce in oral cavity given cues an additional time; material suctioned from oral cavity. Continue NPO with ST continuing to follow for swallow and complete/docuement language assessment.     Aspiration Risk  Severe    Diet Recommendation NPO        Other  Recommendations Oral Care  Recommendations: Oral care BID   Follow Up Recommendations   (TBD)    Frequency and Duration min 2x/week  2 weeks   Pertinent Vitals/Pain None observed       Swallow Study           Oral/Motor/Sensory Function Overall Oral Motor/Sensory Function:  (unable to assess, no response to commands)   Ice Chips Ice chips: Impaired Presentation: Spoon Oral Phase Impairments:  (no attempts to manipulate)   Thin Liquid Thin Liquid: Not tested    Nectar Thick Nectar Thick Liquid: Not tested   Honey Thick Honey Thick Liquid: Not tested   Puree Puree: Impaired Presentation: Spoon Oral Phase Impairments:  (no attempts to manipulate) Oral Phase Functional Implications: Oral holding   Solid   GO    Solid: Not tested       [name] 04/02/2014,3:01 PM  Breck Coons Lonell Face.Ed [employer] [phone]

## 2014-04-02 NOTE — Progress Notes (Signed)
EEG completed; results pending.    

## 2014-04-02 NOTE — Progress Notes (Signed)
VASCULAR LAB PRELIMINARY  PRELIMINARY  PRELIMINARY  PRELIMINARY  Carotid Dopplers completed.    Preliminary report:  1-39% ICA stenosis.  Vertebral artery flow is antegrade.   Stavroula Rohde, RVT 04/02/2014, 11:50 AM

## 2014-04-02 NOTE — Progress Notes (Signed)
Echocardiogram 2D Echocardiogram has been performed.  Rebecca [name] 04/02/2014, 10:05 AM

## 2014-04-02 NOTE — Progress Notes (Signed)
eLink Physician-Brief Progress Note Patient Name: Rebecca [name] DOB: Dec 21, 1945 MRN: [id number]   Date of Service  04/02/2014  HPI/Events of Note  Acute encephalopathy > stroke? Stable on BIPAP/camera check Has Fabry's disease with cardiomyopathy, now with elevated Troponin of undetermined significance  eICU Interventions  No eICU intervention     Intervention Category Evaluation Type: New Patient Evaluation  [name] 04/02/2014, 1:06 AM

## 2014-04-02 NOTE — Progress Notes (Signed)
RT placed pt on venturi mask of 50%. Pt tolerating well at this time with all vitals within normal limits.

## 2014-04-03 DIAGNOSIS — R4701 Aphasia: Secondary | ICD-10-CM | POA: Diagnosis present

## 2014-04-03 DIAGNOSIS — I5043 Acute on chronic combined systolic (congestive) and diastolic (congestive) heart failure: Secondary | ICD-10-CM

## 2014-04-03 DIAGNOSIS — E785 Hyperlipidemia, unspecified: Secondary | ICD-10-CM | POA: Diagnosis present

## 2014-04-03 DIAGNOSIS — I480 Paroxysmal atrial fibrillation: Secondary | ICD-10-CM | POA: Diagnosis present

## 2014-04-03 LAB — GLUCOSE, CAPILLARY
GLUCOSE-CAPILLARY: 118 mg/dL — AB (ref 70–99)
Glucose-Capillary: 109 mg/dL — ABNORMAL HIGH (ref 70–99)
Glucose-Capillary: 111 mg/dL — ABNORMAL HIGH (ref 70–99)
Glucose-Capillary: 114 mg/dL — ABNORMAL HIGH (ref 70–99)
Glucose-Capillary: 117 mg/dL — ABNORMAL HIGH (ref 70–99)
Glucose-Capillary: 123 mg/dL — ABNORMAL HIGH (ref 70–99)

## 2014-04-03 LAB — LIPID PANEL
Cholesterol: 187 mg/dL (ref 0–200)
HDL: 66 mg/dL (ref >39)
LDL CALC: 102 mg/dL — AB (ref 0–99)
TRIGLYCERIDES: 96 mg/dL (ref <150)
Total CHOL/HDL Ratio: 2.8 RATIO
VLDL: 19 mg/dL (ref 0–40)

## 2014-04-03 LAB — COMPREHENSIVE METABOLIC PANEL
ALK PHOS: 35 U/L — AB (ref 39–117)
ALT: 16 U/L (ref 0–35)
ANION GAP: 10 (ref 5–15)
AST: 36 U/L (ref 0–37)
Albumin: 3.6 g/dL (ref 3.5–5.2)
BILIRUBIN TOTAL: 0.8 mg/dL (ref 0.3–1.2)
BUN: 18 mg/dL (ref 6–23)
CO2: 29 mmol/L (ref 19–32)
Calcium: 9.1 mg/dL (ref 8.4–10.5)
Chloride: 102 mEq/L (ref 96–112)
Creatinine, Ser: 1.03 mg/dL (ref 0.50–1.10)
GFR calc Af Amer: 63 mL/min — ABNORMAL LOW (ref >90)
GFR, EST NON AFRICAN AMERICAN: 54 mL/min — AB (ref >90)
Glucose, Bld: 117 mg/dL — ABNORMAL HIGH (ref 70–99)
Potassium: 4 mmol/L (ref 3.5–5.1)
Sodium: 141 mmol/L (ref 135–145)
Total Protein: 7.3 g/dL (ref 6.0–8.3)

## 2014-04-03 LAB — CBC
HEMATOCRIT: 37.5 % (ref 36.0–46.0)
HEMOGLOBIN: 12.4 g/dL (ref 12.0–15.0)
MCH: 27.6 pg (ref 26.0–34.0)
MCHC: 33.1 g/dL (ref 30.0–36.0)
MCV: 83.3 fL (ref 78.0–100.0)
PLATELETS: 200 10*3/uL (ref 150–400)
RBC: 4.5 MIL/uL (ref 3.87–5.11)
RDW: 15.1 % (ref 11.5–15.5)
WBC: 5.6 10*3/uL (ref 4.0–10.5)

## 2014-04-03 LAB — HEMOGLOBIN A1C
Hgb A1c MFr Bld: 6 % — ABNORMAL HIGH (ref <5.7)
MEAN PLASMA GLUCOSE: 126 mg/dL — AB (ref <117)

## 2014-04-03 LAB — MAGNESIUM: Magnesium: 1.8 mg/dL (ref 1.5–2.5)

## 2014-04-03 MED ORDER — APIXABAN 5 MG PO TABS
5.0000 mg | ORAL_TABLET | Freq: Two times a day (BID) | ORAL | Status: DC
  Filled 2014-04-03 (×4): qty 1

## 2014-04-03 MED ORDER — ACETAMINOPHEN 650 MG RE SUPP
650.0000 mg | Freq: Four times a day (QID) | RECTAL | Status: DC | PRN
  Administered 2014-04-03 – 2014-04-05 (×2): 650 mg via RECTAL
  Filled 2014-04-03 (×2): qty 1

## 2014-04-03 MED ORDER — METOPROLOL TARTRATE 1 MG/ML IV SOLN
2.5000 mg | Freq: Four times a day (QID) | INTRAVENOUS | Status: DC
  Administered 2014-04-03 – 2014-04-06 (×12): 2.5 mg via INTRAVENOUS
  Filled 2014-04-03 (×12): qty 5

## 2014-04-03 MED ORDER — WHITE PETROLATUM GEL
Status: AC
Start: 2014-04-03 — End: 2014-04-03
  Administered 2014-04-03: 11:00:00
  Filled 2014-04-03: qty 1

## 2014-04-03 NOTE — Plan of Care (Signed)
Problem: Progression Outcomes Goal: Educational plan initiated Outcome: Progressing Family advocators for patient explained all procedures today and MD had conversation with family

## 2014-04-03 NOTE — Progress Notes (Signed)
Dr. Joseph Art made aware of patient having short runs of V-Tach /remains stable

## 2014-04-03 NOTE — Evaluation (Signed)
Speech Language Pathology Evaluation Patient Details Name: Rebecca [name] MRN: [id number] DOB: 10/01/1945 Today's Date: 04/03/2014 Time: 5374-8270 SLP Time Calculation (min) (ACUTE ONLY): 8 min  Problem List:  Patient Active Problem List   Diagnosis Date Noted  . Global aphasia   . Paroxysmal atrial fibrillation   . Hyperlipidemia   . Acute diastolic heart failure 04/02/2014  . Elevated troponin I level   . Essential hypertension   . Acute on chronic combined systolic and diastolic CHF (congestive heart failure)   . Pulmonary hypertension   . Chronic atrial fibrillation   . Diabetes type 2, controlled   . HLD (hyperlipidemia)   . Elevated troponin 04/01/2014  . Stroke 04/01/2014  . Aphasia 04/01/2014  . 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 Atrial fibrillation 05/01/2013  . Tobacco abuse 05/01/2013  . Expressive aphasia 03/23/2013  . Diabetes mellitus 03/23/2013  . Hypertension 03/23/2013  . Decreased calculated GFR 03/23/2013   Past Medical History:  Past Medical History  Diagnosis Date  . Hypertension   . 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 Expressive aphasia 03/23/2013  . Atrial fibrillation 05/01/2013  . Tobacco abuse 05/01/2013  . Diabetes mellitus without complication   . Depression   . Stroke     2015   Past Surgical History:  Past Surgical History  Procedure Laterality Date  . Tee without cardioversion N/A 03/26/2013    Procedure: TRANSESOPHAGEAL ECHOCARDIOGRAM (TEE);  Surgeon: Thurmon Fair, MD;  Location: [facility] ENDOSCOPY;  Service: Cardiovascular;  Laterality: N/A;  . Cesarean section      x3  . Hip replacement Right 1990's  . Hip replacemet Left 1990's   HPI:  68 y.o. 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 MRI Acute  on chronic LEFT frontal lobe infarct (middle cerebral artery territory. Bilateral basal ganglia and thalamus lacunar infarcts. Recently hospitalized with stroke with expressive aphasia and mild weakness on the right side. Failed RN stroke swallow scren.   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 Poor motor planning/problem solving requiring hand over hand assist during trials liquid via cup. No response to yes/no questions re: biographical or environmental information. ST will continue cognitiv-linguistic tx while in hospital and recommend continued ST.     SLP Assessment  Patient needs continued Speech Lanaguage Pathology Services    Follow Up Recommendations  Skilled Nursing facility    Frequency and Duration min 2x/week  2 weeks   Pertinent Vitals/Pain Pain Assessment:  (discomfort versus restlessness, RN in room (aware)   SLP Goals  Progression toward goals: Progressing toward goals Potential to Achieve Goals (ACUTE ONLY):  (fair-good) Potential Considerations (ACUTE ONLY): Severity of impairments  SLP Evaluation Prior Functioning  Cognitive/Linguistic Baseline: Baseline deficits (global aphasia since 03/24/13)   Cognition  Overall Cognitive Status:  (CVA 1/5 global aphasia) Arousal/Alertness: Awake/alert Orientation Level:  (no response to y/n ?'s) Attention: Sustained Sustained Attention: Impaired Sustained Attention Impairment: Verbal basic;Functional basic Memory:  (TBA) Awareness: Impaired Awareness Impairment: Intellectual impairment;Emergent impairment;Anticipatory impairment Problem Solving: Appears intact Behaviors: Restless Safety/Judgment: Impaired    Comprehension  Auditory Comprehension Overall Auditory Comprehension: Appears within functional limits for tasks assessed Yes/No Questions:  Impaired Basic  Biographical Questions: 0-25% accurate Basic Immediate Environment Questions: 0-24% accurate Commands: Impaired One Step Basic Commands: 0-24% accurate Interfering Components: Attention Visual Recognition/Discrimination Discrimination: Not tested Reading Comprehension Reading Status: Not tested    Expression Expression Primary Mode of Expression:  (global aphasia, no gestures observed) Verbal Expression Overall Verbal Expression: Impaired Initiation: Impaired Automatic Speech:  (no response) Repetition:  (NT) Naming: Not tested Pragmatics: Impairment Impairments: Abnormal affect;Eye contact Written Expression Dominant Hand:  (? right?) Written Expression: Not tested   Oral / Motor Oral Motor/Sensory Function Overall Oral Motor/Sensory Function:  (unable to follow commands) Motor Speech Overall Motor Speech: Impaired Respiration: Within functional limits Phonation:  (global aphasia)   GO     [name] 04/03/2014, 12:21 PM  Breck Coons Lonell Face.Ed [employer] [phone]

## 2014-04-03 NOTE — Progress Notes (Signed)
Subjective: Shrugs her shoulders when asked if she had pain.  Objective: Vital signs in last 24 hours: Temp:  [98.3 F (36.8 C)-98.9 F (37.2 C)] 98.5 F (36.9 C) (01/15 0744) Pulse Rate:  [51-74] 61 (01/15 0744) Resp:  [12-26] 19 (01/15 0744) BP: (123-151)/(43-124) 129/43 mmHg (01/15 0744) SpO2:  [94 %-100 %] 100 % (01/15 0744) Weight:  [137 lb 12.6 oz (62.5 kg)-138 lb 0.1 oz (62.6 kg)] 138 lb 0.1 oz (62.6 kg) (01/15 0500) Weight change: -1 lb 15.8 oz (-0.9 kg) Last BM Date: 04/02/14 Intake/Output from previous day: -1125 (-2230 since admit)  Wt 138, down from 139  01/14 0701 - 01/15 0700 In: 1050 [I.V.:1050] Out: 2250 [Urine:2250] Intake/Output this shift: Total I/O In: 200 [P.O.:200] Out: 150 [Urine:150]  PE:  PE: General:Pleasant to flat affect, NAD, more alert today Skin:Warm and dry, brisk capillary refill HEENT:normocephalic, sclera clear, mucus membranes moist Heart:S1S2 RRR without murmur, gallup, rub or click Lungs:clear, ant.  without rales, rhonchi, or wheezes PFX:TKWI, non tender, + BS, do not palpate liver spleen or masses Ext:tr lower ext edema, 2+ pedal pulses, 2+ radial pulses Neuro:alert shakes her head and shrugs shoulders more appropriately   Tele: With SR with PACs, PVCs also short bursts of NSVT at times.  Lab Results:  Recent Labs  04/01/14 1745  WBC 5.4  HGB 12.7  HCT 38.6  PLT 181   BMET  Recent Labs  04/01/14 1745 04/02/14 0420  NA 143 145  K 4.3 3.9  CL 105 109  CO2 19 23  GLUCOSE 130* 149*  BUN 21 19  CREATININE 1.04 0.97  CALCIUM 9.6 9.5    Recent Labs  04/02/14 0420 04/02/14 1000  TROPONINI 0.28* 0.28*    Lab Results  Component Value Date   CHOL 187 04/03/2014   HDL 66 04/03/2014   LDLCALC 102* 04/03/2014   TRIG 96 04/03/2014   CHOLHDL 2.8 04/03/2014   Lab Results  Component Value Date   HGBA1C 6.1* 04/02/2014     Recent Labs  04/01/14 1745  PROT 7.5  ALBUMIN 3.9  AST 38*  ALT 15    ALKPHOS 36*  BILITOT 0.9    Recent Labs  04/03/14 0414  CHOL 187   No results for input(s): PROTIME in the last 72 hours.     Studies/Results: Dg Chest 2 View  04/02/2014   CLINICAL DATA:  Shortness of breath, and aphasia  EXAM: CHEST  2 VIEW  COMPARISON:  Portable chest x-ray of April 01, 2014  FINDINGS: The lungs are well-expanded and clear. The cardiopericardial silhouette is enlarged. The pulmonary vascularity is mildly prominent centrally without cephalization. There is no pulmonary edema. There is tortuosity of the descending thoracic aorta. The bony thorax exhibits no acute abnormalities.  IMPRESSION: Cardiomegaly without pulmonary edema. There is no pneumonia. Stable appearance of the chest since yesterday's study.   Electronically Signed   By: David  [place]   On: 04/02/2014 07:38   Ct Head Wo Contrast  04/01/2014   CLINICAL DATA:  Unresponsive tonight.  Initial encounter.  EXAM: CT HEAD WITHOUT CONTRAST  TECHNIQUE: Contiguous axial images were obtained from the base of the skull through the vertex without intravenous contrast.  COMPARISON:  Head CT 12/25/2003.  MRI brain 03/24/2013.  FINDINGS: There is no evidence of acute intracranial hemorrhage, mass lesion, brain edema or extra-axial fluid collection. The ventricles and subarachnoid spaces are mildly prominent but stable. Extensive chronic small vessel ischemic changes  and old cortical infarcts are again noted bilaterally. There is increased low-density and gyral swelling in the left frontotemporal region (images 13-16) which could reflect a subacute infarct. Intracranial vascular calcifications again noted.  The visualized paranasal sinuses, mastoid air cells and middle ears are clear. The calvarium is intact.  IMPRESSION: Extensive chronic ischemic changes bilaterally with possible subacute extension in the left frontotemporal region. No evidence of acute intracranial hemorrhage.   Electronically Signed   By: Roxy Horseman M.D.    On: 04/01/2014 20:55   Mr Brain Wo Contrast  04/02/2014   CLINICAL DATA:  Recent hospitalization for stroke, found noncommunicating and shivering today.  EXAM: MRI HEAD WITHOUT CONTRAST  MRA HEAD WITHOUT CONTRAST  TECHNIQUE: Multiplanar, multiecho pulse sequences of the brain and surrounding structures were obtained without intravenous contrast. Angiographic images of the head were obtained using MRA technique without contrast.  COMPARISON:  CT of the head March 24, 2013 and CT of the head April 01, 2014  FINDINGS: MRI HEAD FINDINGS  Moderately motion degraded examination. Reduced diffusion in LEFT frontal lobe, spanning greater territory than on prior MRI. Corresponding low ADC values. Multiple subcentimeter central with a lesser extent peripheral foci of susceptibility artifact, increased from prior examination though, in a similar distribution.  Moderate ventriculomegaly, likely on the basis of global parenchymal brain volume loss as there is overall commensurate enlargement of cerebral sulci and cerebellar folia. Bilateral small cerebellar infarcts, relatively unchanged. Bilateral occipital lobe encephalomalacia, advanced on the RIGHT, new on the LEFT. Severe confluent supratentorial white matter FLAIR T2 hyperintensities without mass effect. Cystic encephalomalacia of the LEFT frontal lobe underlying the area of T2 bright cytotoxic edema. Bilateral basal ganglia and thalamus lacunar infarcts.  No abnormal extra-axial fluid collections. Status post bilateral ocular lens implants. RIGHT maxillary mucosal retention cyst without paranasal sinus air-fluid levels. The mastoid air cells are well aerated. Mild sellar expansion, can be seen with arachnoid cyst or less likely empty sella considering a thin rind of apparent pituitary tissue along the floor. No cerebellar tonsillar ectopia. No suspicious calvarial bone marrow signal.  MRA HEAD FINDINGS  Moderately motion degraded examination.  Anterior circulation:  Flow related enhancement observed within the cervical, petrous, cavernous and supra clinoid internal carotid arteries. Due to motion, there is a duplicated appearance of the carotid siphons. Flow related enhancement within the anterior cerebral arteries, robust flow related enhancement of the RIGHT middle cerebral artery, somewhat less robust flow related enhancement of LEFT middle cerebral artery without focal conclusion.  Posterior circulation: LEFT vertebral artery is dominant. Very poor visualization of the proximal basilar artery, attributed to motion and tortuosity, with normal flow related enhancement of basilar tip. Normal flow early enhancement of the posterior cerebral arteries.  Dolichoectatic appearance intracranial vessels without large vessel occlusion. Limited assessment for aneurysm or vasculopathy due to the degree of motion.  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  Susceptibility artifact in a pattern suggesting sequela of chronic hypertension with severe white matter changes which may be seen with chronic small vessel ischemic disease. Bilateral basal ganglia and thalamus lacunar infarcts.  Bifrontal encephalomalacia, further propagation on the RIGHT, new on the LEFT consistent with interval bilateral posterior cerebral artery territory infarcts.  MRA HEAD: Moderately motion degraded examination, no large vessel occlusion. Slightly decreased flow related enhancement of the LEFT middle cerebral artery. Findings may be better characterized on CTA of the head as patient had difficulty remaining still for the examination. In addition,  poor visualization of the proximal basilar artery which is likely artifact though could be confirmed on CTA of the head.  Dolicoectatic intracranial vessels can be seen with chronic hypertension.   Electronically Signed   By: Awilda Metro   On: 04/02/2014 06:15   Dg Chest Port 1 View  04/01/2014    CLINICAL DATA:  Altered mental status with weakness. Possible stroke. Initial encounter.  EXAM: PORTABLE CHEST - 1 VIEW  COMPARISON:  12/24/2013.  FINDINGS: 1812 hr. There is stable cardiomegaly and vascular ectasia. The lungs are clear. There is no pleural effusion or pneumothorax. No acute osseous findings are seen. Multiple telemetry leads overlie the chest.  IMPRESSION: Stable cardiomegaly.  No acute cardiopulmonary process demonstrated.   Electronically Signed   By: Roxy Horseman M.D.   On: 04/01/2014 18:41   Mr Maxine Glenn Head/brain Wo Cm  04/02/2014   CLINICAL DATA:  Recent hospitalization for stroke, found noncommunicating and shivering today.  EXAM: MRI HEAD WITHOUT CONTRAST  MRA HEAD WITHOUT CONTRAST  TECHNIQUE: Multiplanar, multiecho pulse sequences of the brain and surrounding structures were obtained without intravenous contrast. Angiographic images of the head were obtained using MRA technique without contrast.  COMPARISON:  CT of the head March 24, 2013 and CT of the head April 01, 2014  FINDINGS: MRI HEAD FINDINGS  Moderately motion degraded examination. Reduced diffusion in LEFT frontal lobe, spanning greater territory than on prior MRI. Corresponding low ADC values. Multiple subcentimeter central with a lesser extent peripheral foci of susceptibility artifact, increased from prior examination though, in a similar distribution.  Moderate ventriculomegaly, likely on the basis of global parenchymal brain volume loss as there is overall commensurate enlargement of cerebral sulci and cerebellar folia. Bilateral small cerebellar infarcts, relatively unchanged. Bilateral occipital lobe encephalomalacia, advanced on the RIGHT, new on the LEFT. Severe confluent supratentorial white matter FLAIR T2 hyperintensities without mass effect. Cystic encephalomalacia of the LEFT frontal lobe underlying the area of T2 bright cytotoxic edema. Bilateral basal ganglia and thalamus lacunar infarcts.  No abnormal  extra-axial fluid collections. Status post bilateral ocular lens implants. RIGHT maxillary mucosal retention cyst without paranasal sinus air-fluid levels. The mastoid air cells are well aerated. Mild sellar expansion, can be seen with arachnoid cyst or less likely empty sella considering a thin rind of apparent pituitary tissue along the floor. No cerebellar tonsillar ectopia. No suspicious calvarial bone marrow signal.  MRA HEAD FINDINGS  Moderately motion degraded examination.  Anterior circulation: Flow related enhancement observed within the cervical, petrous, cavernous and supra clinoid internal carotid arteries. Due to motion, there is a duplicated appearance of the carotid siphons. Flow related enhancement within the anterior cerebral arteries, robust flow related enhancement of the RIGHT middle cerebral artery, somewhat less robust flow related enhancement of LEFT middle cerebral artery without focal conclusion.  Posterior circulation: LEFT vertebral artery is dominant. Very poor visualization of the proximal basilar artery, attributed to motion and tortuosity, with normal flow related enhancement of basilar tip. Normal flow early enhancement of the posterior cerebral arteries.  Dolichoectatic appearance intracranial vessels without large vessel occlusion. Limited assessment for aneurysm or vasculopathy due to the degree of motion.  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  Susceptibility artifact in a pattern suggesting sequela of chronic hypertension with severe white matter changes which may be seen with chronic small vessel ischemic disease. Bilateral basal ganglia and thalamus lacunar infarcts.  Bifrontal encephalomalacia, further propagation on the RIGHT, new on the LEFT  consistent with interval bilateral posterior cerebral artery territory infarcts.  MRA HEAD: Moderately motion degraded examination, no large vessel occlusion. Slightly  decreased flow related enhancement of the LEFT middle cerebral artery. Findings may be better characterized on CTA of the head as patient had difficulty remaining still for the examination. In addition, poor visualization of the proximal basilar artery which is likely artifact though could be confirmed on CTA of the head.  Dolicoectatic intracranial vessels can be seen with chronic hypertension.   Electronically Signed   By: Awilda Metro   On: 04/02/2014 06:15   ECHO: Left ventricle: The cavity size was normal. There was severe concentric hypertrophy. Systolic function was mildly reduced. The estimated ejection fraction was in the range of 45% to 50%. Wall motion was normal; there were no regional wall motion abnormalities. Features are consistent with a pseudonormal left ventricular filling pattern, with concomitant abnormal relaxation and increased filling pressure (grade 2 diastolic dysfunction). Doppler parameters are consistent with elevated ventricular end-diastolic filling pressure. - Aortic valve: Posteriorly directed jet of the aortic insufficiency hitting the anterior mitral valve leaflet. There was moderate regurgitation. - Aortic root: The aortic root was normal in size. - Mitral valve: Structurally normal valve. There was mild regurgitation. - Left atrium: The atrium was severely dilated. - Right ventricle: Systolic function was mildly reduced. - Right atrium: The atrium was moderately dilated. - Tricuspid valve: There was mild regurgitation. - Pulmonic valve: There was no regurgitation. - Pulmonary arteries: Systolic pressure was mildly increased. PA peak pressure: 42 mm Hg (S). - Inferior vena cava: The vessel was normal in size. - Pericardium, extracardiac: There was no pericardial effusion. Impressions: - Compared to the prior study from 03/24/2013 the left ventricle still has severe left ventricular hypertrophy but there is now mild  systolic dysfunction with grade 2 diastolic dysfunction and elevated filling pressures. Right ventricular systolic function is mildly reduced. RVSP 42 mmHg. Moderate aortic and mitral regurgitation. Severely dilated left atrium. 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  Medications: I have reviewed the patient's current medications. Scheduled Meds: . antiseptic oral rinse  7 mL Mouth Rinse q12n4p  . apixaban  5 mg Oral BID  . aspirin  300 mg Rectal Daily  . atorvastatin  40 mg Oral q1800  . chlorhexidine  15 mL Mouth Rinse BID  . escitalopram  5 mg Oral Daily  . insulin aspart  0-9 Units Subcutaneous 6 times per day  . loratadine  10 mg Oral Daily  . metoprolol  2.5 mg Intravenous 4 times per day  . multivitamin with minerals  1 tablet Oral Daily   Continuous Infusions: . sodium chloride 10 mL/hr at 04/02/14 1800  . 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 She is aphasic (not able to communicate) and weak, R>L. CT showed subacute left frontotemporal infarct. Patient also was tachypneic with oxygen saturation in 80s on room air, was on Bipap. Troponin was mildly elevated at 0.25, so cardiology was consulted.   1. CVA: Per neurology. She has a history of paroxysmal atrial fibrillation and prior CVA. She has been on Xarelto, but unable to tell me whether she has been taking it compliantly. If she has been taking it regularly, Eliquis 5 mg bid has been ordered instead due to better bleeding risk profile. She does not have bleeding on  her CT, so likely can continue her anticoagulation.  2. Atrial fibrillation: Paroxysmal. Currently in NSR. As above, she was compliant with Xarelto, would consider eventual transition to  Eliquis. Son states she was compliant.  NPO, still not taking oral, unable to do swallowing test until pt tries.  con't IV lopressor.  Pt not on anticoagulation, she did not pass swallowing test, If feeding tube placed could give meds but currently may need to be on heparin.  3. 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 She is requiring Bipap due to hypoxia. She has JVD and peripheral edema, so suspect a component of CHF.  - Lasix 40 mg IV bid, 1st dose yesterday.now has been stopped -1125, today, wt down 2 lbs. . - BNP 2537, follow BMET--Cr stable.at 0.97.   Echo has been done , results  Above, similar to previous echo.  4. Elevated troponin: Mild elevation at 0.25 and has stayed in that range. This may be demand ischemia with acute on chronic diastolic CHF and hypoxia, may also be related to CVA (can see mild rise in troponin with CVA in absence of plaque rupture). ECG is abnormal but not changed from prior. no chest pain. Of note, Fabry's does increase the risk of both epicardial and small vessel CAD.  - Cycle troponin to peak. --> 0.28 today - Would maintain her on a statin. Lipitor 40 ? Need for nuc study   5. NSVT still with brief episodes, 5 beats at times. On lopressor at 2.5 every 6 hours  6. HTN mostly in the 150s/90s on occ down.  7.  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 :15 minutes. [facility] R  Nurse Practitioner Certified Pager [phone] or after 5pm and on weekends call [phone] 04/03/2014, 11:04 AM  I have personally seen and examined patient and agree with note as outlined by Nada Boozer NP.  Maintaining NSR on IV lopressor.  Change back to PO once taking PO.  Xarelto and ASA ordered but only getting ASA per rectum and not able to swallow  Xarelto at this time.  Per neuro, will restart once she can swallow.  If this is going to be a prolonged time recommend that Heparin gtt IV be started in the interim.  Will leave final decision to Neuro.  Troponin bump is flat and unlikely to represent NSTEMI.  This is most likely secondary to demand ischemia and can see troponin bump with CVA.  EF on most recent echo is decreased at 45-50% compared to prior echo a year ago.  I do not think nuclear stress test is warranted at this time because cath contraindicated at this time due to acute CVA especially if she has not had any chest pain.    Signed: Armanda Magic, MD [facility] 04/03/2014

## 2014-04-03 NOTE — Progress Notes (Signed)
OT Cancellation Note  Patient Details Name: Rebecca [name] MRN: [id number] DOB: Jan 17, 1946   Cancelled Treatment:    Reason Eval/Treat Not Completed: Patient not medically ready Pt with bedrest orders. Please update activity orders when appropriate in order to begin therapy. Thanks [facility] Jeidy Hoerner, OTR/L  [phone] 04/03/2014 04/03/2014, 6:56 AM

## 2014-04-03 NOTE — Progress Notes (Signed)
STROKE TEAM PROGRESS NOTE   HISTORY Rebecca [name] is an 69 y.o. female Who was recently In hospital with stroke and placed on Xeralto at that time for Afib. Stroke work up at that time as below. At that time she was noted to have expressive aphasia and mild weakness on the right side. Patient was brought to hospital today 04/01/2014 after her son came home and found her leaning on the counter "slumped over and not communicating. She also was shivering in all 4 extremities". She was brought to ED where she was noted to have respiratory compromise and placed on BIPAP. she was LKW earlier in the day at 0830a.  In the ED she is awake but not conversive, will follow only simple commands and more notably on the left side. Winces to pain on the left greater than right.  Per son her baseline was --walking with a cane, able to converse but had difficulty with exp[ressing herself. No right sided weakness.  Patient was not administered TPA secondary to being on xarelto. She was admitted for further evaluation and treatment.    SUBJECTIVE (INTERVAL HISTORY) Her son is the bedside.   She is kore alert and interactive but remains quite aphasic. No witnessed seizure activity.   OBJECTIVE Temp:  [98.3 F (36.8 C)-98.9 F (37.2 C)] 98.5 F (36.9 C) (01/15 0744) Pulse Rate:  [51-74] 61 (01/15 0744) Cardiac Rhythm:  [-] Normal sinus rhythm (01/15 0937) Resp:  [12-26] 19 (01/15 0744) BP: (123-151)/(43-124) 129/43 mmHg (01/15 0744) SpO2:  [94 %-100 %] 100 % (01/15 0744) Weight:  [137 lb 12.6 oz (62.5 kg)-138 lb 0.1 oz (62.6 kg)] 138 lb 0.1 oz (62.6 kg) (01/15 0500)   Recent Labs Lab 04/02/14 1121 04/02/14 1756 04/02/14 2015 04/03/14 0032 04/03/14 0423  GLUCAP 107* 103* 91 109* 118*    Recent Labs Lab 04/01/14 1745 04/02/14 0420  NA 143 145  K 4.3 3.9  CL 105 109  CO2 19 23  GLUCOSE 130* 149*  BUN 21 19  CREATININE 1.04 0.97  CALCIUM 9.6 9.5    Recent Labs Lab 04/01/14 1745  AST  38*  ALT 15  ALKPHOS 36*  BILITOT 0.9  PROT 7.5  ALBUMIN 3.9    Recent Labs Lab 04/01/14 1745  WBC 5.4  NEUTROABS 3.4  HGB 12.7  HCT 38.6  MCV 83.9  PLT 181    Recent Labs Lab 04/01/14 1745 04/01/14 2210 04/02/14 0420 04/02/14 1000  TROPONINI 0.25* 0.27* 0.28* 0.28*   No results for input(s): LABPROT, INR in the last 72 hours.  Recent Labs  04/01/14 1843  COLORURINE YELLOW  LABSPEC 1.021  PHURINE 5.0  GLUCOSEU NEGATIVE  HGBUR NEGATIVE  BILIRUBINUR NEGATIVE  KETONESUR NEGATIVE  PROTEINUR 100*  UROBILINOGEN 0.2  NITRITE NEGATIVE  LEUKOCYTESUR NEGATIVE       Component Value Date/Time   CHOL 187 04/03/2014 0414   TRIG 96 04/03/2014 0414   HDL 66 04/03/2014 0414   CHOLHDL 2.8 04/03/2014 0414   VLDL 19 04/03/2014 0414   LDLCALC 102* 04/03/2014 0414   Lab Results  Component Value Date   HGBA1C 6.1* 04/02/2014      Component Value Date/Time   LABOPIA NONE DETECTED 04/02/2014 0130   COCAINSCRNUR NONE DETECTED 04/02/2014 0130   LABBENZ NONE DETECTED 04/02/2014 0130   AMPHETMU NONE DETECTED 04/02/2014 0130   THCU NONE DETECTED 04/02/2014 0130   LABBARB NONE DETECTED 04/02/2014 0130     Recent Labs Lab 04/01/14 1745  ETH <5  Dg Chest 2 View  04/02/2014   CLINICAL DATA:  Shortness of breath, and aphasia  EXAM: CHEST  2 VIEW  COMPARISON:  Portable chest x-ray of April 01, 2014  FINDINGS: The lungs are well-expanded and clear. The cardiopericardial silhouette is enlarged. The pulmonary vascularity is mildly prominent centrally without cephalization. There is no pulmonary edema. There is tortuosity of the descending thoracic aorta. The bony thorax exhibits no acute abnormalities.  IMPRESSION: Cardiomegaly without pulmonary edema. There is no pneumonia. Stable appearance of the chest since yesterday's study.   Electronically Signed   By: David  [place]   On: 04/02/2014 07:38   Ct Head Wo Contrast  04/01/2014   CLINICAL DATA:  Unresponsive tonight.   Initial encounter.  EXAM: CT HEAD WITHOUT CONTRAST  TECHNIQUE: Contiguous axial images were obtained from the base of the skull through the vertex without intravenous contrast.  COMPARISON:  Head CT 12/25/2003.  MRI brain 03/24/2013.  FINDINGS: There is no evidence of acute intracranial hemorrhage, mass lesion, brain edema or extra-axial fluid collection. The ventricles and subarachnoid spaces are mildly prominent but stable. Extensive chronic small vessel ischemic changes and old cortical infarcts are again noted bilaterally. There is increased low-density and gyral swelling in the left frontotemporal region (images 13-16) which could reflect a subacute infarct. Intracranial vascular calcifications again noted.  The visualized paranasal sinuses, mastoid air cells and middle ears are clear. The calvarium is intact.  IMPRESSION: Extensive chronic ischemic changes bilaterally with possible subacute extension in the left frontotemporal region. No evidence of acute intracranial hemorrhage.   Electronically Signed   By: Roxy Horseman M.D.   On: 04/01/2014 20:55   Mr Brain Wo Contrast  04/02/2014   CLINICAL DATA:  Recent hospitalization for stroke, found noncommunicating and shivering today.  EXAM: MRI HEAD WITHOUT CONTRAST  MRA HEAD WITHOUT CONTRAST  TECHNIQUE: Multiplanar, multiecho pulse sequences of the brain and surrounding structures were obtained without intravenous contrast. Angiographic images of the head were obtained using MRA technique without contrast.  COMPARISON:  CT of the head March 24, 2013 and CT of the head April 01, 2014  FINDINGS: MRI HEAD FINDINGS  Moderately motion degraded examination. Reduced diffusion in LEFT frontal lobe, spanning greater territory than on prior MRI. Corresponding low ADC values. Multiple subcentimeter central with a lesser extent peripheral foci of susceptibility artifact, increased from prior examination though, in a similar distribution.  Moderate ventriculomegaly,  likely on the basis of global parenchymal brain volume loss as there is overall commensurate enlargement of cerebral sulci and cerebellar folia. Bilateral small cerebellar infarcts, relatively unchanged. Bilateral occipital lobe encephalomalacia, advanced on the RIGHT, new on the LEFT. Severe confluent supratentorial white matter FLAIR T2 hyperintensities without mass effect. Cystic encephalomalacia of the LEFT frontal lobe underlying the area of T2 bright cytotoxic edema. Bilateral basal ganglia and thalamus lacunar infarcts.  No abnormal extra-axial fluid collections. Status post bilateral ocular lens implants. RIGHT maxillary mucosal retention cyst without paranasal sinus air-fluid levels. The mastoid air cells are well aerated. Mild sellar expansion, can be seen with arachnoid cyst or less likely empty sella considering a thin rind of apparent pituitary tissue along the floor. No cerebellar tonsillar ectopia. No suspicious calvarial bone marrow signal.  MRA HEAD FINDINGS  Moderately motion degraded examination.  Anterior circulation: Flow related enhancement observed within the cervical, petrous, cavernous and supra clinoid internal carotid arteries. Due to motion, there is a duplicated appearance of the carotid siphons. Flow related enhancement within the anterior cerebral arteries, robust  flow related enhancement of the RIGHT middle cerebral artery, somewhat less robust flow related enhancement of LEFT middle cerebral artery without focal conclusion.  Posterior circulation: LEFT vertebral artery is dominant. Very poor visualization of the proximal basilar artery, attributed to motion and tortuosity, with normal flow related enhancement of basilar tip. Normal flow early enhancement of the posterior cerebral arteries.  Dolichoectatic appearance intracranial vessels without large vessel occlusion. Limited assessment for aneurysm or vasculopathy due to the degree of motion.  IMPRESSION: MRI HEAD: Acute on chronic  LEFT frontal lobe infarct (middle cerebral artery territory).  Susceptibility artifact in a pattern suggesting sequela of chronic hypertension with severe white matter changes which may be seen with chronic small vessel ischemic disease. Bilateral basal ganglia and thalamus lacunar infarcts.  Bifrontal encephalomalacia, further propagation on the RIGHT, new on the LEFT consistent with interval bilateral posterior cerebral artery territory infarcts.  MRA HEAD: Moderately motion degraded examination, no large vessel occlusion. Slightly decreased flow related enhancement of the LEFT middle cerebral artery. Findings may be better characterized on CTA of the head as patient had difficulty remaining still for the examination. In addition, poor visualization of the proximal basilar artery which is likely artifact though could be confirmed on CTA of the head.  Dolicoectatic intracranial vessels can be seen with chronic hypertension.   Electronically Signed   By: Awilda Metro   On: 04/02/2014 06:15   Dg Chest Port 1 View  04/01/2014   CLINICAL DATA:  Altered mental status with weakness. Possible stroke. Initial encounter.  EXAM: PORTABLE CHEST - 1 VIEW  COMPARISON:  12/24/2013.  FINDINGS: 1812 hr. There is stable cardiomegaly and vascular ectasia. The lungs are clear. There is no pleural effusion or pneumothorax. No acute osseous findings are seen. Multiple telemetry leads overlie the chest.  IMPRESSION: Stable cardiomegaly.  No acute cardiopulmonary process demonstrated.   Electronically Signed   By: Roxy Horseman M.D.   On: 04/01/2014 18:41   Mr Maxine Glenn Head/brain Wo Cm  04/02/2014   CLINICAL DATA:  Recent hospitalization for stroke, found noncommunicating and shivering today.  EXAM: MRI HEAD WITHOUT CONTRAST  MRA HEAD WITHOUT CONTRAST  TECHNIQUE: Multiplanar, multiecho pulse sequences of the brain and surrounding structures were obtained without intravenous contrast. Angiographic images of the head were obtained  using MRA technique without contrast.  COMPARISON:  CT of the head March 24, 2013 and CT of the head April 01, 2014  FINDINGS: MRI HEAD FINDINGS  Moderately motion degraded examination. Reduced diffusion in LEFT frontal lobe, spanning greater territory than on prior MRI. Corresponding low ADC values. Multiple subcentimeter central with a lesser extent peripheral foci of susceptibility artifact, increased from prior examination though, in a similar distribution.  Moderate ventriculomegaly, likely on the basis of global parenchymal brain volume loss as there is overall commensurate enlargement of cerebral sulci and cerebellar folia. Bilateral small cerebellar infarcts, relatively unchanged. Bilateral occipital lobe encephalomalacia, advanced on the RIGHT, new on the LEFT. Severe confluent supratentorial white matter FLAIR T2 hyperintensities without mass effect. Cystic encephalomalacia of the LEFT frontal lobe underlying the area of T2 bright cytotoxic edema. Bilateral basal ganglia and thalamus lacunar infarcts.  No abnormal extra-axial fluid collections. Status post bilateral ocular lens implants. RIGHT maxillary mucosal retention cyst without paranasal sinus air-fluid levels. The mastoid air cells are well aerated. Mild sellar expansion, can be seen with arachnoid cyst or less likely empty sella considering a thin rind of apparent pituitary tissue along the floor. No cerebellar tonsillar ectopia. No suspicious  calvarial bone marrow signal.  MRA HEAD FINDINGS  Moderately motion degraded examination.  Anterior circulation: Flow related enhancement observed within the cervical, petrous, cavernous and supra clinoid internal carotid arteries. Due to motion, there is a duplicated appearance of the carotid siphons. Flow related enhancement within the anterior cerebral arteries, robust flow related enhancement of the RIGHT middle cerebral artery, somewhat less robust flow related enhancement of LEFT middle cerebral  artery without focal conclusion.  Posterior circulation: LEFT vertebral artery is dominant. Very poor visualization of the proximal basilar artery, attributed to motion and tortuosity, with normal flow related enhancement of basilar tip. Normal flow early enhancement of the posterior cerebral arteries.  Dolichoectatic appearance intracranial vessels without large vessel occlusion. Limited assessment for aneurysm or vasculopathy due to the degree of motion.  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  Susceptibility artifact in a pattern suggesting sequela of chronic hypertension with severe white matter changes which may be seen with chronic small vessel ischemic disease. Bilateral basal ganglia and thalamus lacunar infarcts.  Bifrontal encephalomalacia, further propagation on the RIGHT, new on the LEFT consistent with interval bilateral posterior cerebral artery territory infarcts.  MRA HEAD: Moderately motion degraded examination, no large vessel occlusion. Slightly decreased flow related enhancement of the LEFT middle cerebral artery. Findings may be better characterized on CTA of the head as patient had difficulty remaining still for the examination. In addition, poor visualization of the proximal basilar artery which is likely artifact though could be confirmed on CTA of the head.  Dolicoectatic intracranial vessels can be seen with chronic hypertension.   Electronically Signed   By: Awilda Metro   On: 04/02/2014 06:15    PHYSICAL EXAM Elderly african american lady not in distress.Awake alert. Afebrile. Head is nontraumatic. Neck is supple without bruit. Hearing is normal. Cardiac exam no murmur or gallop. Lungs are clear to auscultation. Distal pulses are well felt. Neurological Exam : awake alert. Globally Aphasic and mute not speaking. Follows   few simple commands. Right gaze preference but able to look to the left past midline. Blinks to  threat more on the left than the right. No facial weakness. Tongue midline. Able to move all 4 extremities well against gravity without focal weakness noted. Deep tendon reflexes are 2+ symmetric. Plantars downgoing. Sensation appears preserved bilaterally. Coordination and gait cannot be tested reliably.   ASSESSMENT/PLAN Rebecca [name] is a 69 y.o. female with history of hypertension, atrial fibrillation on Xarelto, Fabry disease, hx of tobacco abuse, diabetes mellitus, recent stroke and  depression presenting with aphasia. She did not receive IV t-PA due to being on anticoagulation.   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 Vertebral artery flow is antegrade.     2D Echo  Left ventricle: The cavity size was normal. There was severe concentric hypertrophy. Systolic function was mildly reduced. The estimated ejection fraction was in the range of 45% to 50%. Wall motion was normal; there were no regional wall motion abnormalities.  HgbA1c 6.1  EEG normal. No epileptiform activity  SCDs for VTE prophylaxis    . Failed swallow eval d/t lethargy.  aspirin 81 mg orally  every day and xarelto ( rivaroxaban) prior to admission, continued in hospital. Due to NPO status, changed to aspirin 300 mg suppository daily  Ongoing aggressive stroke risk factor management  Complete stroke work up  Therapy recommendations:  pending   Disposition:  pending   Atrial Fibrillation  Found via loop recorder placed post stroke  Home meds:  Xarelto and aspirin, continued in the hospital, though pt unable to swallow. Changed to asa suppository for now. Once able to swallow, will  change back   Hypertension  BP 133-188/71-109 past 24h (04/03/2014 @ 9:55 AM)  Permissive hypertension (OK if < 220/120) but gradually normalize in 5-7 days  Hyperlipidemia  Home meds:  lipitor 40, resumed in hospital though pt currently unable to swallow  LDL 96, goal < 70  Diabetes  HgbA1c 6.1 goal < 7.0  Other Stroke Risk Factors  Fabry's disease  Advanced age  Hx stroke/TIA - 1/15 left MCA CVA with aphasia, symptoms mostly resolved  Other Active Problems  Respiratory insuffiencey. On bipap at admission.  Depression   Elevated troponin: Mild elevation at 0.25. demand ischemia with acute on chronic diastolic CHF and hypoxia, may also be related to CVA. 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  Asymptomatic VT, 8 beats x 2 this am  Hospital day # 2    Rebecca [name] has an embolic left middle cerebral artery infarct with significant residual aphasia. Etiology likely cardioembolic from known history of atrial fibrillation. nterestingly she also has history of Fabry`s disease which is also on known to cause strokes and a younger age .Unlcear as to wether she was compliant with taking xarelto or not. Continue for now but consider changing to Eliquis if history of compliance is confirmed from son.Give aspirin rectally till she is able to swallow. D/W Dr Joseph Art and patient`s son.Mobilize out of bed and therapy consults. She will likely need rehab.Stroke team will sign off. Call for questions. Follow up as outpatient in 2 months. Delia Heady, MD Medical Director [facility] Pager: [phone] 04/03/2014 9:55 AM   To contact Stroke Continuity provider, please refer to [email or web address]. After hours, contact General Neurology

## 2014-04-03 NOTE — Progress Notes (Signed)
PT Cancellation Note  Patient Details Name: Rebecca [name] MRN: [id number] DOB: 1945-12-17   Cancelled Treatment:      Pt with bedrest orders. Please update activity orders when appropriate in order to begin therapy. Thank you.  Fabio Asa 04/03/2014, 8:03 AM Charlotte Crumb, PT DPT  [phone]

## 2014-04-03 NOTE — Progress Notes (Signed)
Speech Language Pathology Treatment: Dysphagia  Patient Details Name: Rebecca [name] MRN: [id number] DOB: [birth year] Today's Date: 04/03/2014 Time: 3845-3646 SLP Time Calculation (min) (ACUTE ONLY): 8 min  Assessment / Plan / Recommendation Clinical Impression  Pt seen for ability to initiate po's and speech-language-cognitive assessment (see full report). Pt alert and extremely restless in bed, globally aphasic and unable to follow SLP's commands or communicate needs (? pain, etc although no true grimacing). Multiple attempts with ice, thin liquid and applesauce. Initiated swallow x 1 with cup sip water, self fed with hand over hand total assist. Verbal/oral apraxia present in addition to global aphasia. Given clinical finding since yesterday, she is not yet appropriate for objective swallow evaluation and should remain NPO with temporary means of nutrition. SLP will continue to follow for objective assessment when able. Continue oral care.   HPI HPI: 69 y.o. 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 MRI Acute on chronic LEFT frontal lobe infarct (middle cerebral artery territory. Bilateral basal ganglia and thalamus lacunar infarcts. Recently hospitalized with stroke with expressive aphasia and mild weakness on the right side. Failed RN stroke swallow scren.   Pertinent Vitals Pain Assessment:  (discomfort versus restlessness, RN in room (aware))  SLP Plan  Continue with current plan of care    Recommendations Diet recommendations: NPO              Oral Care Recommendations: Oral care BID Follow up Recommendations: Skilled Nursing facility Plan: Continue with current plan of care    GO     [name] 04/03/2014,  11:53 AM  Breck Coons Lonell Face.Ed [employer] [phone]

## 2014-04-03 NOTE — Progress Notes (Signed)
Patients son says that his mother does not take flu shots

## 2014-04-03 NOTE — Progress Notes (Addendum)
[place] TEAM 1 - Stepdown/ICU TEAM Progress Note  Rebecca [name] ZOX:[id number] DOB: 01-10-1946 DOA: 04/01/2014 PCP: Burtis Junes, MD  Admit HPI / Brief Narrative: Rebecca [name] is a 69 y.o.BF PMHx Depression, hypertension, atrial fibrillation on Xarelto, Fabry disease, tobacco abuse, diabetes mellitus Type 2 uncontrolled, stroke, who presents with aphasia.  Patient was recently hospitalized because of stroke. He was discharged on Xeralto for Afib. At that time she was noted to have expressive aphasia and mild weakness on the right side. Patient was brought to hospital today after her son found patient leaning on the counter "slumped over and not communicating and was shivering in all 4 extremities".Patiwent was found to have oxygen desaturation to 88% on room air, which improved to 100% after placed on BIPAP. When I evaluated the patient in ED, she was awake, but is nonverbal. She only followed some simple commands. It is difficult to assess whether patient has any pain.   Work up in the ED demonstrates CT-head no bleeding. Chest x-rays negative.No leukocytosis. Her EKG showed PVC with T-wave inversion in lead 1. Trop is elevated at 0.25. Patient is admitted to inpatient for further evaluation and treatment. Neurology and cardiology were consulted by ED.   HPI/Subjective: 1/15 A/O patient opens her eyes simultaneously, moved simultaneously, appears to be mildly agitated. Follows all commands   Assessment/Plan: Global aphagia: CT head is negative for intracranial bleeding. Neurology was consulted, concerning for left brain CVA versus possible seizure. -will admit to SDU -follow up Neuro's recommendations as follows: -HgbA1c, fasting lipid panel pending- -PT consult, OT consult, Speech consult pending - Carotid dopplers pending -Frequent neuro checks -NPO until passes stroke swallow screen  HTN:  -In the setting of acute stroke maintain SBP 145-165 will hold  lisinopril, HCTZ in the setting of possible acute stroke. -Continue metoprolol IV 2.5 mg QID -Discontinue furosemide  Elevated troponin:  -Troponin 0.25. Difficult to assess whether patient has chest pain. Cardiology was consulted. Dr. Shirlee Latch evaluated the patient, and thought this may be demand ischemia with acute on chronic diastolic CHF and hypoxia, may also be related to CVA. - ASA 300 mg PR -Lipitor 40 mg daily,  -continue metoprolol 2.5 mg QID -Son [name] states that mother is religious about taking her Xarelto. After talking with Dr. Pearlean Brownie (stroke team) Will change patient to Eliquis 5 mg BID, after patient passes swallow evaluation  Acute on chronic combined systolic and diastolic CHF:  -Per Dr. 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 -Was requiring Bipap due to hypoxia.  -She has JVD and peripheral edema, so Dr. Shirlee Latch suspects patient has component of CHF.  - Discontinue Lasix secondary to patient's acute stroke, and soft BP for acute stroke.In the setting of acute stroke maintain SBP 145-165 - Check BNP, follow BMET.  -Strict in and out since admission -2.2 L -Daily a.m. weight question mark; 1/14 bed weight= 62.5 kg             1/15 bed weight= 62.6 kg  Pulmonary hypertension -See HTN  Paroxysmal Afib:  -Currently in NSR -Heart rate is well controlled.  -Patient still running slightly low BP for new stroke patient decrease  metoprolol IV 2.5 mg QID -Patient had new CVA while on Xarelto; DC Xarelto start Eliquis 5 mg BID  Diabetes type 2 controlled -Continue on sensitive SSI -hemoglobin A1c pending -Patient able  to cooperate today will reconsult speech for swallowing evaluation  Hyperlipidemia:  -LDL was 104 on 03/24/13. -Continue Lipitor 40 mg daily  Fabry disease:  -Agalsidase 1 mg/kilogram q 14  days. Last dose per son last dose administered on 8 January  week.     Code Status: FULL Family Communication: [name] (Son) present at time of exam Disposition Plan: Per neurology; SNF    Consultants: Dr Georga Hacking Camilo,(Neurology) Dr. Delia Heady (neurology, stroke) Dr. Marca Ancona (cardiology )    Procedure/Significant Events: 1/14 MRI/MRA Brain without contrast;- Acute on chronic LEFT frontal lobe infarct (middle cerebral artery territory). - Bilateral basal ganglia and thalamus lacunar infarcts. -Bifrontal encephalomalacia, further propagation on the RIGHT, new on the LEFT consistent with interval bilateral posterior cerebral artery territory infarcts. -no large vessel occlusion. Slightly decreased flow related enhancement of the LEFT middle cerebral artery.  1/14 EEG: normal asleep EEG. No electrographic seizures noted 1/14 echocardiogram;- Left ventricle: severeconcentric hypertrophy. -LVEF=45% to 50%.  -(grade 2 diastolic dysfunction). - Aortic valve:Moderate regurgitation. - Left atrium: severely dilated. -Right atrium: moderately dilated. -Pulmonary arteries: PApeak pressure: 42 mm Hg (S).   Culture NA  Antibiotics: NA  DVT prophylaxis: Xarelto   Devices    LINES / TUBES:      Continuous Infusions: . sodium chloride 10 mL/hr at 04/02/14 1800  . dextrose 50 mL (04/02/14 1903)    Objective: VITAL SIGNS: Temp: 98.5 F (36.9 C) (01/15 0744) Temp Source: Oral (01/15 0744) BP: 129/43 mmHg (01/15 0744) Pulse Rate: 61 (01/15 0744) SPO2; FIO2:   Intake/Output Summary (Last 24 hours) at 04/03/14 0910 Last data filed at 04/03/14 0600  Gross per 24 hour  Intake   1050 ml  Output   2250 ml  Net  -1200 ml     Exam: General: A/O, responds to commands, eyes are spontaneously open, will track you around room,  No acute respiratory distress Lungs: Positive diffuse expiratory wheezing, negative crackles  Cardiovascular: Regular rate  and rhythm without murmur gallop or rub normal S1 and S2 Abdomen: Nontender, nondistended, soft, bowel sounds positive, no rebound, no ascites, no appreciable mass Extremities: No significant cyanosis, clubbing, or edema bilateral lower extremities Neurologic; cranial nerves II through XII intact, tongue/uvula midline, all extremities strength 5/5, appears to have sensation intact throughout. Follows all commands. Continued global aphasia. .  Data Reviewed: Basic Metabolic Panel:  Recent Labs Lab 04/01/14 1745 04/02/14 0420  NA 143 145  K 4.3 3.9  CL 105 109  CO2 19 23  GLUCOSE 130* 149*  BUN 21 19  CREATININE 1.04 0.97  CALCIUM 9.6 9.5   Liver Function Tests:  Recent Labs Lab 04/01/14 1745  AST 38*  ALT 15  ALKPHOS 36*  BILITOT 0.9  PROT 7.5  ALBUMIN 3.9   No results for input(s): LIPASE, AMYLASE in the last 168 hours. No results for input(s): AMMONIA in the last 168 hours. CBC:  Recent Labs Lab 04/01/14 1745  WBC 5.4  NEUTROABS 3.4  HGB 12.7  HCT 38.6  MCV 83.9  PLT 181   Cardiac Enzymes:  Recent Labs Lab 04/01/14 1745 04/01/14 2210 04/02/14 0420 04/02/14 1000  TROPONINI 0.25* 0.27* 0.28* 0.28*   BNP (last 3 results) No results for input(s): PROBNP in the last 8760 hours. CBG:  Recent Labs Lab 04/02/14 1121 04/02/14 1756 04/02/14 2015 04/03/14 0032 04/03/14 0423  GLUCAP 107* 103* 91 109* 118*    Recent Results (from the past 240 hour(s))  MRSA PCR Screening  Status: None   Collection Time: 04/02/14  1:17 AM  Result Value Ref Range Status   MRSA by PCR NEGATIVE NEGATIVE Final    Comment:        The GeneXpert MRSA Assay (FDA approved for NASAL specimens only), is one component of a comprehensive MRSA colonization surveillance program. It is not intended to diagnose MRSA infection nor to guide or monitor treatment for MRSA infections.      Studies:  Recent x-ray studies have been reviewed in detail by the Attending  Physician  Scheduled Meds:  Scheduled Meds: . antiseptic oral rinse  7 mL Mouth Rinse q12n4p  . aspirin  300 mg Rectal Daily  . atorvastatin  40 mg Oral q1800  . chlorhexidine  15 mL Mouth Rinse BID  . escitalopram  5 mg Oral Daily  . insulin aspart  0-9 Units Subcutaneous 6 times per day  . loratadine  10 mg Oral Daily  . metoprolol  5 mg Intravenous 4 times per day  . multivitamin with minerals  1 tablet Oral Daily  . rivaroxaban  20 mg Oral QAC supper    Time spent on care of this patient: 40 mins   Drema Dallas , MD   [facility] Office  [phone] Pager [phone]  On-Call/Text Page:      [email or web address]      password [email or web address]  If 7PM-7AM, please contact night-coverage [email or web address] Password [email or web address] 04/03/2014, 9:10 AM   LOS: 2 days

## 2014-04-04 ENCOUNTER — Inpatient Hospital Stay (HOSPITAL_COMMUNITY): Payer: Medicare Other

## 2014-04-04 LAB — GLUCOSE, CAPILLARY
GLUCOSE-CAPILLARY: 111 mg/dL — AB (ref 70–99)
GLUCOSE-CAPILLARY: 120 mg/dL — AB (ref 70–99)
GLUCOSE-CAPILLARY: 104 mg/dL — AB (ref 70–99)
GLUCOSE-CAPILLARY: 106 mg/dL — AB (ref 70–99)
Glucose-Capillary: 108 mg/dL — ABNORMAL HIGH (ref 70–99)
Glucose-Capillary: 134 mg/dL — ABNORMAL HIGH (ref 70–99)

## 2014-04-04 LAB — HEPARIN LEVEL (UNFRACTIONATED): Heparin Unfractionated: 0.53 IU/mL (ref 0.30–0.70)

## 2014-04-04 MED ORDER — JEVITY 1.2 CAL PO LIQD
1000.0000 mL | ORAL | Status: DC
  Administered 2014-04-04: 20 mL/h
  Administered 2014-04-06: 1000 mL
  Filled 2014-04-04 (×7): qty 1000

## 2014-04-04 MED ORDER — HEPARIN BOLUS VIA INFUSION
3000.0000 [IU] | Freq: Once | INTRAVENOUS | Status: AC
  Administered 2014-04-04: 3000 [IU] via INTRAVENOUS
  Filled 2014-04-04: qty 3000

## 2014-04-04 MED ORDER — HEPARIN (PORCINE) IN NACL 100-0.45 UNIT/ML-% IJ SOLN
900.0000 [IU]/h | INTRAMUSCULAR | Status: DC
  Administered 2014-04-04 – 2014-04-06 (×3): 900 [IU]/h via INTRAVENOUS
  Filled 2014-04-04 (×4): qty 250

## 2014-04-04 NOTE — Progress Notes (Signed)
Patient is in no respiratory distress and BiPAP is not needed at this time.  BBS-Clear, SPO2-100%,.  RN to notify RT if needed.

## 2014-04-04 NOTE — Progress Notes (Signed)
ANTICOAGULATION CONSULT NOTE - Initial Consult  Pharmacy Consult for Heparin Indication: atrial fibrillation  No Known Allergies  Patient Measurements: Height: 5\' 4"  (162.6 cm) Weight: 138 lb 0.1 oz (62.6 kg) IBW/kg (Calculated) : 54.7 Heparin Dosing Weight: 62.6 kg  Vital Signs: Temp: 97.5 F (36.4 C) (01/16 0747) Temp Source: Oral (01/16 0747) BP: 171/94 mmHg (01/16 0747) Pulse Rate: 67 (01/16 0747)  Labs:  Recent Labs  04/01/14 1745 04/01/14 2210 04/02/14 0420 04/02/14 1000 04/03/14 1005  HGB 12.7  --   --   --  12.4  HCT 38.6  --   --   --  37.5  PLT 181  --   --   --  200  CREATININE 1.04  --  0.97  --  1.03  TROPONINI 0.25* 0.27* 0.28* 0.28*  --     Estimated Creatinine Clearance: 44.5 mL/min (by C-G formula based on Cr of 1.03).   Medical History: Past Medical History  Diagnosis Date  . Hypertension   . 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 Expressive aphasia 03/23/2013  . Atrial fibrillation 05/01/2013  . Tobacco abuse 05/01/2013  . Diabetes mellitus without complication   . Depression   . Stroke     2015    Assessment: 69 yo F presents on 1/13 with aphasia. PMH pertinent for Afib, HTN, DM, stroke, depression. Transitioned to Eliquis for Xarelto this hospital visit but unable to pass swallow eval. Plan is to place NG tube today but will cover with heparin until then. CBC stable. No s/s of bleed.  Goal of Therapy:  Heparin level 0.3-0.7 units/ml Monitor platelets by anticoagulation protocol: Yes   Plan:  Give heparin bolus of 3,000 units Start heparin gtt at 900 units/hr Check 6 HL at 1930 Monitor daily HL, CBC, s/s of bleed F/U restart of apixaban (swallow eval or NG tube placement)   Mavin Dyke J 04/04/2014,12:30 PM

## 2014-04-04 NOTE — Progress Notes (Signed)
Pharmacy: [facility]  Patient is a 69 y.o F on heparin for afib.  Heparin level now back therapeutic at 0.53.  No bleeding documented.  Plan: - continue current rate of 900 units/hr  Dorna Leitz, PharmD, BCPS

## 2014-04-04 NOTE — Progress Notes (Signed)
INITIAL NUTRITION ASSESSMENT  Pt meets criteria for SEVERE MALNUTRITION in the context of acute illness as evidenced by 9 % weight loss x 1 month and severe fat and muscle depletion.  DOCUMENTATION CODES Per approved criteria  -Severe malnutrition in the context of acute illness or injury   INTERVENTION: Initiate Jevity 1.2 @ 20 ml/hr via NG tube and increase by 10 ml every 4 hours to goal rate of 60 ml/hr.   Tube feeding regimen provides 1728 kcal (>100% of minimum needs), 80 grams of protein, and 1166 ml of H2O.   Monitor magnesium, potassium, and phosphorus daily for at least 3 days, MD to replete as needed, as pt is at risk for refeeding syndrome given severe malnutrition.  NUTRITION DIAGNOSIS: Inadequate oral intake related to inability to eat as evidenced by NPO status  Goal: Pt to meet >/= 90% of their estimated nutrition needs   Monitor:  TF tolerance and adequacy, weight trends  Reason for Assessment: Consult received to initiate and manage enteral nutrition support.  69 y.o. female  Admitting Dx: Expressive aphasia  ASSESSMENT: Pt with recent admission for stroke admitted with expressive aphasia.  Pt failed swallow eval and consulted to start TF.  No family present, pt unable to answer questions. Per chart review pt has lost 9% of her body weight x 1 month.   Nutrition Focused Physical Exam:  Subcutaneous Fat:  Orbital Region: WDL Upper Arm Region: WDL Thoracic and Lumbar Region: WDl  Muscle:  Temple Region: severe depletion Clavicle Bone Region: severe depletion Clavicle and Acromion Bone Region: severe depletion Scapular Bone Region: severe depletion  Dorsal Hand: severe depletion Patellar Region: mild/moderate depletion Anterior Thigh Region: severe depletion Posterior Calf Region: severe depletion  Edema: not present   Height: Ht Readings from Last 1 Encounters:  04/02/14 5\' 4"  (1.626 m)    Weight: Wt Readings from Last 1 Encounters:   04/03/14 138 lb 0.1 oz (62.6 kg)    Ideal Body Weight: 54.5 kg   % Ideal Body Weight: 115%  Wt Readings from Last 10 Encounters:  04/03/14 138 lb 0.1 oz (62.6 kg)  02/19/14 152 lb (68.947 kg)  12/24/13 150 lb (68.04 kg)  11/07/13 158 lb (71.668 kg)  05/01/13 175 lb (79.379 kg)  03/23/13 172 lb (78.019 kg)    Usual Body Weight: 152 lb   % Usual Body Weight: 91%  BMI:  Body mass index is 23.68 kg/(m^2).  Estimated Nutritional Needs: Kcal: 1600-1800 Protein: 75-90 grams Fluid: > 1.6 L/day  Skin: intact   Diet Order:    EDUCATION NEEDS: -No education needs identified at this time   Intake/Output Summary (Last 24 hours) at 04/04/14 1211 Last data filed at 04/04/14 0951  Gross per 24 hour  Intake      0 ml  Output    625 ml  Net   -625 ml    Last BM: 1/15   Labs:   Recent Labs Lab 04/01/14 1745 04/02/14 0420 04/03/14 1005  NA 143 145 141  K 4.3 3.9 4.0  CL 105 109 102  CO2 19 23 29   BUN 21 19 18   CREATININE 1.04 0.97 1.03  CALCIUM 9.6 9.5 9.1  MG  --   --  1.8  GLUCOSE 130* 149* 117*    CBG (last 3)   Recent Labs  04/03/14 2340 04/04/14 0423 04/04/14 0746  GLUCAP 108* 111* 134*    Scheduled Meds: . antiseptic oral rinse  7 mL Mouth Rinse q12n4p  .  apixaban  5 mg Oral BID  . aspirin  300 mg Rectal Daily  . atorvastatin  40 mg Oral q1800  . chlorhexidine  15 mL Mouth Rinse BID  . escitalopram  5 mg Oral Daily  . insulin aspart  0-9 Units Subcutaneous 6 times per day  . loratadine  10 mg Oral Daily  . metoprolol  2.5 mg Intravenous 4 times per day  . multivitamin with minerals  1 tablet Oral Daily    Continuous Infusions: . sodium chloride 10 mL/hr at 04/02/14 1800  . dextrose 50 mL/hr at 04/03/14 1600    Past Medical History  Diagnosis Date  . Hypertension   . 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 Expressive aphasia 03/23/2013  . Atrial fibrillation 05/01/2013  . Tobacco abuse 05/01/2013  . Diabetes mellitus without complication   .  Depression   . Stroke     2015    Past Surgical History  Procedure Laterality Date  . Tee without cardioversion N/A 03/26/2013    Procedure: TRANSESOPHAGEAL ECHOCARDIOGRAM (TEE);  Surgeon: Thurmon Fair, MD;  Location: [facility] ENDOSCOPY;  Service: Cardiovascular;  Laterality: N/A;  . Cesarean section      x3  . Hip replacement Right 1990's  . Hip replacemet Left 1990's    Kendell Bane RD, LDN, CNSC [phone] Pager [phone] After Hours Pager

## 2014-04-04 NOTE — Progress Notes (Signed)
Speech Language Pathology Treatment: Dysphagia;Cognitive-Linquistic  Patient Details Name: Rebecca [name] MRN: [id number] DOB: [birth year] Today's Date: 04/04/2014 Time: 0601-5615 SLP Time Calculation (min) (ACUTE ONLY): 22 min  Assessment / Plan / Recommendation Clinical Impression  SLP provided max verbal and contextual cues to facilitate pt awareness and participation in basic functional tasks and PO trials. Pt was not able to follow any verbal commands even with max visual cues, but she was able to grasp wash cloth, wash face briefly with contextual and visual cues, grasp cup and initiate cup sips. Despite this progress, pt continues to orally hold thick and puree boluses. Initially pt did initiate transit and swallow with one bite of puree and one sips of thin liquids. Immediate aspiration of thin liquids occurred with weak cough. Attention to task waned with oral suction needed to remove bolus.   Overall, severe oral weakness and minimal ability to attend to PO necessitate recommendation for short term alternate nutrition. Even if pt could safely consume purees or thickened liquids, quantity of intake and consistency with function would be poor. SLP will f/u for improvement in further trials.    HPI HPI: 69 y.o. 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 MRI Acute on chronic LEFT frontal lobe infarct (middle cerebral artery territory. Bilateral basal ganglia and thalamus lacunar infarcts. Recently hospitalized with stroke with expressive aphasia and mild weakness on the right side. Failed RN stroke swallow scren.   Pertinent Vitals Pain Assessment: Faces Faces Pain Scale: Hurts little more  SLP Plan  Continue with current plan of care     Recommendations Diet recommendations: NPO Medication Administration: Via alternative means              Oral Care Recommendations: Oral care Q4 per protocol Follow up Recommendations: Inpatient Rehab Plan: Continue with current plan of care    GO    [facility], [place] CCC-SLP [phone]  [name] 04/04/2014, 8:46 AM

## 2014-04-04 NOTE — Progress Notes (Signed)
[facility] TEAM 1 - Stepdown/ICU TEAM Progress Note  Rebecca [name]:[id number] DOB: December 31, 1945 DOA: 04/01/2014 PCP: Burtis Junes, MD  Admit HPI / Brief Narrative: Rebecca [name] is a 69 y.o.BF PMHx Depression, hypertension, atrial fibrillation on Xarelto, Fabry disease, tobacco abuse, diabetes mellitus Type 2 uncontrolled, stroke, who presents with aphasia.  Patient was recently hospitalized because of stroke. He was discharged on Xeralto for Afib. At that time she was noted to have expressive aphasia and mild weakness on the right side. Patient was brought to hospital today after her son found patient leaning on the counter "slumped over and not communicating and was shivering in all 4 extremities".Patiwent was found to have oxygen desaturation to 88% on room air, which improved to 100% after placed on BIPAP. When I evaluated the patient in ED, she was awake, but is nonverbal. She only followed some simple commands. It is difficult to assess whether patient has any pain.   Work up in the ED demonstrates CT-head no bleeding. Chest x-rays negative.No leukocytosis. Her EKG showed PVC with T-wave inversion in lead 1. Trop is elevated at 0.25. Patient is admitted to inpatient for further evaluation and treatment. Neurology and cardiology were consulted by ED.   HPI/Subjective: 1/16 A/O patient opens her eyes simultaneously, moved simultaneously, appears to be mildly agitated. Follows all commands   Assessment/Plan: Global aphagia: CT head is negative for intracranial bleeding. Neurology was consulted, concerning for left brain CVA versus possible seizure. -will admit to SDU -follow up Neuro's recommendations as follows: -HgbA1c, fasting lipid panel pending- -PT consult, OT consult, Speech consult pending - Carotid dopplers pending -Frequent neuro checks -1/16 Failed swallow eval remain NPO; reevaluate in 72 hours   HTN:  -In the setting of acute stroke maintain SBP  145-165 will hold lisinopril, HCTZ in the setting of possible acute stroke. -Continue metoprolol IV 2.5 mg QID -Discontinue furosemide  Elevated troponin:  -Troponin 0.25. Difficult to assess whether patient has chest pain. Cardiology was consulted. Dr. Shirlee Latch evaluated the patient, and thought this may be demand ischemia with acute on chronic diastolic CHF and hypoxia, may also be related to CVA. - ASA 300 mg PR -Lipitor 40 mg daily,  -continue metoprolol 2.5 mg QID -Son Alveta Quintela states that mother is religious about taking her Xarelto. After talking with Dr. Pearlean Brownie (stroke team) Will change patient to Eliquis 5 mg BID, after patient passes swallow evaluation -1/16 patient failed swallow evaluation NG tube will be placed; all compatible meds to be administered via NG tube  Acute on chronic combined systolic and diastolic CHF:  -Per Dr. 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 -Was requiring Bipap due to hypoxia.  -She has JVD and peripheral edema, so Dr. Shirlee Latch suspects patient has component of CHF.  - Continue Hold Lasix secondary to patient's acute stroke. maintain SBP 145-165 - Check BNP, follow BMET.  -Strict in and out since admission -2.8 L -Daily a.m. weight question mark; 1/14 bed weight= 62.5 kg             1/15 bed weight= 62.6 kg  Pulmonary hypertension -See HTN  Paroxysmal Afib:  -Currently in NSR -Heart rate is well controlled.  -new stroke patient continue metoprolol IV 2.5 mg QID, allow permissive HTN -Continue Eliquis 5 mg BID  Diabetes type 2 controlled -Continue on sensitive SSI -1/15 hemoglobin A1c =6.0 -Patient  failed swallow study by speech; remain NPO. -Place NG tube and begin feedings per dietitian (sons agreed to placement)   Hyperlipidemia:  -LDL was 104 on 03/24/13. -Continue Lipitor 40  mg daily  Fabry disease:  -Agalsidase 1 mg/kilogram q 14 days. Last dose per son last dose administered on 8 January  week.     Code Status: FULL Family Communication: [name] (Son) present at time of exam Disposition Plan: Per neurology; SNF    Consultants: Dr Georga Hacking Camilo,(Neurology) Dr. Delia Heady (neurology, stroke) Dr. Marca Ancona (cardiology )    Procedure/Significant Events: 1/14 MRI/MRA Brain without contrast;- Acute on chronic LEFT frontal lobe infarct (middle cerebral artery territory). - Bilateral basal ganglia and thalamus lacunar infarcts. -Bifrontal encephalomalacia, further propagation on the RIGHT, new on the LEFT consistent with interval bilateral posterior cerebral artery territory infarcts. -no large vessel occlusion. Slightly decreased flow related enhancement of the LEFT middle cerebral artery.  1/14 EEG: normal asleep EEG. No electrographic seizures noted 1/14 echocardiogram;- Left ventricle: severeconcentric hypertrophy. -LVEF=45% to 50%.  -(grade 2 diastolic dysfunction). - Aortic valve:Moderate regurgitation. - Left atrium: severely dilated. -Right atrium: moderately dilated. -Pulmonary arteries: PApeak pressure: 42 mm Hg (S).   Culture NA  Antibiotics: NA  DVT prophylaxis: Heparin-->  Eliquis (once NG tube placed)    Devices    LINES / TUBES:      Continuous Infusions: . sodium chloride 10 mL/hr at 04/02/14 1800  . dextrose 50 mL/hr at 04/03/14 1600    Objective: VITAL SIGNS: Temp: 97.5 F (36.4 C) (01/16 0747) Temp Source: Oral (01/16 0747) BP: 171/94 mmHg (01/16 0747) Pulse Rate: 67 (01/16 0747) SPO2; FIO2:   Intake/Output Summary (Last 24 hours) at 04/04/14 1120 Last data filed at 04/04/14 0951  Gross per 24 hour  Intake      0 ml  Output    625 ml  Net   -625 ml     Exam: General: A/O, responds to commands, eyes are spontaneously open, will track you around room,  No acute respiratory  distress, patient appears very frustrated that she is unable to communicate. Lungs: Positive diffuse expiratory wheezing, negative crackles  Cardiovascular: Regular rate and rhythm without murmur gallop or rub normal S1 and S2 Abdomen: Nontender, nondistended, soft, bowel sounds positive, no rebound, no ascites, no appreciable mass Extremities: No significant cyanosis, clubbing, or edema bilateral lower extremities Neurologic; cranial nerves II through XII intact, tongue/uvula midline, all extremities strength 5/5, appears to have sensation intact throughout. Follows all commands. Continued global aphasia. .  Data Reviewed: Basic Metabolic Panel:  Recent Labs Lab 04/01/14 1745 04/02/14 0420 04/03/14 1005  NA 143 145 141  K 4.3 3.9 4.0  CL 105 109 102  CO2 GLUCOSE 130* 149* 117*  BUN CREATININE 1.04 0.97 1.03  CALCIUM 9.6 9.5 9.1  MG  --   --  1.8   Liver Function Tests:  Recent Labs Lab 04/01/14 1745 04/03/14 1005  AST 38* 36  ALT 15 16  ALKPHOS 36* 35*  BILITOT 0.9 0.8  PROT 7.5 7.3  ALBUMIN 3.9 3.6   No results for input(s): LIPASE, AMYLASE in the last 168 hours. No results for input(s): AMMONIA in the last 168 hours. CBC:  Recent Labs Lab 04/01/14 1745 04/03/14 1005  WBC 5.4 5.6  NEUTROABS 3.4  --   HGB 12.7 12.4  HCT 38.6 37.5  MCV 83.9 83.3  PLT 181 200   Cardiac Enzymes:  Recent  Labs Lab 04/01/14 1745 04/01/14 2210 04/02/14 0420 04/02/14 1000  TROPONINI 0.25* 0.27* 0.28* 0.28*   BNP (last 3 results) No results for input(s): PROBNP in the last 8760 hours. CBG:  Recent Labs Lab 04/03/14 1547 04/03/14 2007 04/03/14 2340 04/04/14 0423 04/04/14 0746  GLUCAP 111* 117* 108* 111* 134*    Recent Results (from the past 240 hour(s))  MRSA PCR Screening     Status: None   Collection Time: 04/02/14  1:17 AM  Result Value Ref Range Status   MRSA by PCR NEGATIVE NEGATIVE Final    Comment:        The GeneXpert MRSA Assay  (FDA approved for NASAL specimens only), is one component of a comprehensive MRSA colonization surveillance program. It is not intended to diagnose MRSA infection nor to guide or monitor treatment for MRSA infections.      Studies:  Recent x-ray studies have been reviewed in detail by the Attending Physician  Scheduled Meds:  Scheduled Meds: . antiseptic oral rinse  7 mL Mouth Rinse q12n4p  . apixaban  5 mg Oral BID  . aspirin  300 mg Rectal Daily  . atorvastatin  40 mg Oral q1800  . chlorhexidine  15 mL Mouth Rinse BID  . escitalopram  5 mg Oral Daily  . insulin aspart  0-9 Units Subcutaneous 6 times per day  . loratadine  10 mg Oral Daily  . metoprolol  2.5 mg Intravenous 4 times per day  . multivitamin with minerals  1 tablet Oral Daily    Time spent on care of this patient: 40 mins   Drema Dallas , MD   [facility] Office  [phone] Pager [phone]  On-Call/Text Page:      [email or web address]      password [email or web address]  If 7PM-7AM, please contact night-coverage [email or web address] Password [email or web address] 04/04/2014, 11:20 AM   LOS: 3 days

## 2014-04-05 ENCOUNTER — Inpatient Hospital Stay (HOSPITAL_COMMUNITY): Payer: Medicare Other

## 2014-04-05 DIAGNOSIS — E43 Unspecified severe protein-calorie malnutrition: Secondary | ICD-10-CM

## 2014-04-05 LAB — PHOSPHORUS: PHOSPHORUS: 3.6 mg/dL (ref 2.3–4.6)

## 2014-04-05 LAB — GLUCOSE, CAPILLARY
GLUCOSE-CAPILLARY: 122 mg/dL — AB (ref 70–99)
GLUCOSE-CAPILLARY: 66 mg/dL — AB (ref 70–99)
GLUCOSE-CAPILLARY: 123 mg/dL — AB (ref 70–99)
GLUCOSE-CAPILLARY: 100 mg/dL — AB (ref 70–99)
Glucose-Capillary: 104 mg/dL — ABNORMAL HIGH (ref 70–99)
Glucose-Capillary: 131 mg/dL — ABNORMAL HIGH (ref 70–99)
Glucose-Capillary: 92 mg/dL (ref 70–99)

## 2014-04-05 LAB — CBC
HCT: 37.1 % (ref 36.0–46.0)
HEMOGLOBIN: 12.3 g/dL (ref 12.0–15.0)
MCH: 27.2 pg (ref 26.0–34.0)
MCHC: 33.2 g/dL (ref 30.0–36.0)
MCV: 82.1 fL (ref 78.0–100.0)
Platelets: 205 10*3/uL (ref 150–400)
RBC: 4.52 MIL/uL (ref 3.87–5.11)
RDW: 14.6 % (ref 11.5–15.5)
WBC: 4.4 10*3/uL (ref 4.0–10.5)

## 2014-04-05 LAB — HEPARIN LEVEL (UNFRACTIONATED): HEPARIN UNFRACTIONATED: 0.49 [IU]/mL (ref 0.30–0.70)

## 2014-04-05 LAB — MAGNESIUM: MAGNESIUM: 2 mg/dL (ref 1.5–2.5)

## 2014-04-05 MED ORDER — DEXTROSE 50 % IV SOLN
25.0000 mL | Freq: Once | INTRAVENOUS | Status: AC
  Administered 2014-04-05: 25 mL via INTRAVENOUS

## 2014-04-05 MED ORDER — DEXTROSE 50 % IV SOLN
INTRAVENOUS | Status: AC
  Filled 2014-04-05: qty 50

## 2014-04-05 NOTE — Progress Notes (Signed)
ANTICOAGULATION CONSULT NOTE - Follow Up Consult  Pharmacy Consult for Heparin Indication: atrial fibrillation  No Known Allergies  Patient Measurements: Height: 5\' 4"  (162.6 cm) Weight: 138 lb 0.1 oz (62.6 kg) IBW/kg (Calculated) : 54.7 Heparin Dosing Weight: 62.6 kg  Vital Signs: Temp: 97.3 F (36.3 C) (01/17 0800) Temp Source: Axillary (01/17 0800) BP: 114/85 mmHg (01/17 0800) Pulse Rate: 56 (01/17 0800)  Labs:  Recent Labs  04/02/14 1000 04/03/14 1005 04/04/14 1915 04/05/14 0305  HGB  --  12.4  --  12.3  HCT  --  37.5  --  37.1  PLT  --  200  --  205  HEPARINUNFRC  --   --  0.53 0.49  CREATININE  --  1.03  --   --   TROPONINI 0.28*  --   --   --     Estimated Creatinine Clearance: 44.5 mL/min (by C-G formula based on Cr of 1.03).  Assessment: 69 yo F presents on 1/13 with aphasia. PMH pertinent for Afib, HTN, DM, stroke, depression. Transitioned to Eliquis for Xarelto this hospital visit but unable to pass swallow eval. Plan is to place NG tube but will cover with heparin until then. HL therapeutic at 0.49. CBC stable. No s/s of bleed.  Goal of Therapy:  Heparin level 0.3-0.7 units/ml Monitor platelets by anticoagulation protocol: Yes   Plan:  Heparin gtt at 900 units/hr Hold apixaban Monitor daily HL, CBC, s/s of bleed F/U swallow eval or NG tube placement for apixaban restart  Memori Sammon J 04/05/2014,8:30 AM

## 2014-04-05 NOTE — Progress Notes (Signed)
    Nothing new to update Notes reviewed.   Donato Schultz, MD

## 2014-04-05 NOTE — Evaluation (Signed)
Physical Therapy Evaluation Patient Details Name: Rebecca [name] MRN: [id number] DOB: August 19, 1945 Today's Date: 04/05/2014   History of Present Illness  69 y.o. female admitted to [facility] on 04/01/14 for syncopal episode at home with resulting aphasia.  Stroke workup in progress.  MRI revealed, "Acute on chronic LEFT frontal lobe infarct (middle cerebral artery territory).  Susceptibility artifact in a pattern suggesting sequela of chronic hypertension with severe white matter changes which may be seen with chronic small vessel ischemic disease. Bilateral basal ganglia and thalamus lacunar infarcts. Bifrontal encephalomalacia, further propagation on the RIGHT, new on the LEFT consistent with interval bilateral posterior cerebral artery territory infarcts."Pt also with HTN, elevated triponin (cardiology consulted and thought this may be demand ischemia).  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  Clinical Impression  Pt seems to be better at command following today ~75% of one step commands she could follow and at times needed both visual and or manual assist to get the cues.  Pt was also able to walk with two person assist for safety down the hallway, but demonstrates some balance deficits with staggering gait pattern.  She would benefit from intensive therapy in an inpatient rehab setting before going home.   PT to follow acutely for deficits listed below.       Follow Up Recommendations CIR    Equipment Recommendations  Rolling walker with 5" wheels    Recommendations for Other Services Rehab consult     Precautions / Restrictions Precautions Precautions: Fall Precaution Comments: pt is unstready on her feet.       Mobility  Bed Mobility Overal bed mobility: Needs Assistance Bed Mobility: Supine to Sit     Supine to sit: Mod assist     General bed mobility comments: Mod assist to support trunk during  transitions.  I think this would have been easier if I were not waking the pt up from sleeping.   Transfers Overall transfer level: Needs assistance Equipment used: 1 person hand held assist Transfers: Sit to/from [employer] Sit to Stand: Min assist Stand pivot transfers: Min assist       General transfer comment: Min assist to support trunk to get to standing.  Pt has a posterior lean and is supported by bil legs on the bed for balance.  Min hand held assist to stand and pivot 1-2 steps to the recliner chair (also posterior preference during transfer).    Ambulation/Gait Ambulation/Gait assistance: +2 safety/equipment;Min assist Ambulation Distance (Feet): 75 Feet Assistive device: 2 person hand held assist Gait Pattern/deviations: Step-through pattern;Staggering left;Staggering right Gait velocity: decreased Gait velocity interpretation: Below normal speed for age/gender General Gait Details: two person hand held assist to support pt for balance.  Min assist level overall.  Second person needed for line management, safety and balance.  Pt with staggering gait pattern, and increased DOE during gait (despite O2 in the high 90s on RA).  DOE 2/4.        Modified Rankin (Stroke Patients Only) Modified Rankin (Stroke Patients Only) Pre-Morbid Rankin Score:  (unknown) Modified Rankin: Moderately severe disability     Balance Overall balance assessment: Needs assistance Sitting-balance support: Feet supported;No upper extremity supported Sitting balance-Leahy Scale: Good     Standing balance support: Bilateral upper extremity supported;No upper extremity supported;Single extremity supported Standing balance-Leahy Scale: Fair  Pertinent Vitals/Pain Pain Assessment: Faces Pain Score: 0-No pain    Home Living Family/patient expects to be discharged to:: Unsure                 Additional Comments: Pt with  significant aphasia and unable to report hx.  No family present to report.     Prior Function                    Extremity/Trunk Assessment   Upper Extremity Assessment: Difficult to assess due to impaired cognition           Lower Extremity Assessment: Difficult to assess due to impaired cognition (functionally can bear weight bil with no signs of buckling)      Cervical / Trunk Assessment: Normal  Communication   Communication: Receptive difficulties;Expressive difficulties;Other (comment) (per SLP note global )  Cognition     Overall Cognitive Status: Difficult to assess                               Assessment/Plan    PT Assessment Patient needs continued PT services  PT Diagnosis Difficulty walking;Abnormality of gait;Generalized weakness   PT Problem List Decreased strength;Decreased activity tolerance;Decreased balance;Decreased mobility;Decreased cognition;Decreased knowledge of use of DME;Decreased safety awareness;Decreased knowledge of precautions  PT Treatment Interventions DME instruction;Gait training;Functional mobility training;Stair training;Therapeutic activities;Therapeutic exercise;Balance training;Neuromuscular re-education;Patient/family education;Cognitive remediation   PT Goals (Current goals can be found in the Care Plan section) Acute Rehab PT Goals Patient Stated Goal: unable to state PT Goal Formulation: Patient unable to participate in goal setting Time For Goal Achievement: 04/19/14 Potential to Achieve Goals: Good    Frequency Min 4X/week   Barriers to discharge   unsure of home environment.        End of Session Equipment Utilized During Treatment: Gait belt Activity Tolerance: Patient limited by fatigue;Other (comment) (limited by DOE) Patient left: in chair;with call bell/phone within reach;with chair alarm set Nurse Communication: Mobility status         Time: 7619-5093 PT Time Calculation (min) (ACUTE  ONLY): 38 min   Charges:   PT Evaluation $Initial PT Evaluation Tier I: 1 Procedure PT Treatments $Gait Training: 8-22 mins $Therapeutic Activity: 8-22 mins        Ariannah Arenson B. Trica Usery, PT, DPT [phone]   04/05/2014, 3:09 PM

## 2014-04-05 NOTE — Progress Notes (Signed)
[place] TEAM 1 - Stepdown/ICU TEAM Progress Note  NATASSJA OLLIS ZOX:[id number] DOB: [birth year] DOA: 04/01/2014 PCP: Burtis Junes, MD  Admit HPI / Brief Narrative: Rebecca [name] is a 69 y.o.BF PMHx Depression, hypertension, atrial fibrillation on Xarelto, Fabry disease, tobacco abuse, diabetes mellitus Type 2 uncontrolled, stroke, who presents with aphasia.  Patient was recently hospitalized because of stroke. He was discharged on Xeralto for Afib. At that time she was noted to have expressive aphasia and mild weakness on the right side. Patient was brought to hospital today after her son found patient leaning on the counter "slumped over and not communicating and was shivering in all 4 extremities".Patiwent was found to have oxygen desaturation to 88% on room air, which improved to 100% after placed on BIPAP. When I evaluated the patient in ED, she was awake, but is nonverbal. She only followed some simple commands. It is difficult to assess whether patient has any pain.   Work up in the ED demonstrates CT-head no bleeding. Chest x-rays negative.No leukocytosis. Her EKG showed PVC with T-wave inversion in lead 1. Trop is elevated at 0.25. Patient is admitted to inpatient for further evaluation and treatment. Neurology and cardiology were consulted by ED.   HPI/Subjective: 1/17 A/O patient opens her eyes simultaneously, moved simultaneously, appears to be mildly agitated. Follows all commands   Assessment/Plan: Global aphagia: CT head is negative for intracranial bleeding. Neurology was consulted, concerning for left brain CVA versus possible seizure. -will admit to SDU -follow up Neuro's recommendations as follows: -HgbA1c, fasting lipid panel pending- -PT consult, OT consult, Speech consult pending - Carotid dopplers pending -Frequent neuro checks -1/16 Failed swallow eval remain NPO; reevaluate in 72 hours   HTN:  -In the setting of acute stroke maintain SBP  145-165 will hold lisinopril, HCTZ in the setting of possible acute stroke. -Continue metoprolol IV 2.5 mg QID -Discontinue furosemide  Elevated troponin:  -Troponin 0.25. Difficult to assess whether patient has chest pain. Cardiology was consulted. Dr. Shirlee Latch evaluated the patient, and thought this may be demand ischemia with acute on chronic diastolic CHF and hypoxia, may also be related to CVA. - ASA 300 mg PR -Lipitor 40 mg daily,  -continue metoprolol 2.5 mg QID -Son Christalyn Goertz states that mother is religious about taking her Xarelto. After talking with Dr. Pearlean Brownie (stroke team) Will change patient to Eliquis 5 mg BID, after patient passes swallow evaluation -1/16 patient failed swallow evaluation NG tube will be placed; all compatible meds to be administered via NG tube  Acute on chronic combined systolic and diastolic CHF:  -Per Dr. 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 -Was requiring Bipap due to hypoxia.  -She has JVD and peripheral edema, so Dr. Shirlee Latch suspects patient has component of CHF.  - Continue Hold Lasix secondary to patient's acute stroke. maintain SBP 145-165 - Check BNP, follow BMET.  -Strict in and out since admission -2.8 L -Daily a.m. weight question mark; 1/14 bed weight= 62.5 kg             1/15 bed weight= 62.6 kg  Pulmonary hypertension -See HTN  Paroxysmal Afib:  -Currently in NSR -Heart rate is well controlled.  -new stroke patient continue metoprolol IV 2.5 mg QID, allow permissive HTN -Continue Eliquis 5 mg BID  Diabetes type 2 controlled -Continue on sensitive SSI -1/15 hemoglobin A1c =6.0 -Patient  failed swallow study by speech; remain NPO. -NG tube placed continue to feed Jevity 1.2 CAL, goal rate of 21ml/hr   Hyperlipidemia:  -LDL was 104 on 03/24/13. -Continue Lipitor 40 mg  daily  Fabry disease:  -Agalsidase 1 mg/kilogram q 14 days. Last dose per son last dose administered on 8 January  week.  Protein-calorie malnutrition, severe -Continue Jevity 1.2 CAL, goal rate of 79ml/hr -Currently at 27ml/hr    Code Status: FULL Family Communication: [name] (Son) present at time of exam Disposition Plan: Per neurology; SNF    Consultants: Dr Georga Hacking Camilo,(Neurology) Dr. Delia Heady (neurology, stroke) Dr. Marca Ancona (cardiology )    Procedure/Significant Events: 1/14 MRI/MRA Brain without contrast;- Acute on chronic LEFT frontal lobe infarct (middle cerebral artery territory). - Bilateral basal ganglia and thalamus lacunar infarcts. -Bifrontal encephalomalacia, further propagation on the RIGHT, new on the LEFT consistent with interval bilateral posterior cerebral artery territory infarcts. -no large vessel occlusion. Slightly decreased flow related enhancement of the LEFT middle cerebral artery.  1/14 EEG: normal asleep EEG. No electrographic seizures noted 1/14 echocardiogram;- Left ventricle: severeconcentric hypertrophy. -LVEF=45% to 50%.  -(grade 2 diastolic dysfunction). - Aortic valve:Moderate regurgitation. - Left atrium: severely dilated. -Right atrium: moderately dilated. -Pulmonary arteries: PApeak pressure: 42 mm Hg (S).   Culture NA  Antibiotics: NA  DVT prophylaxis: Heparin-->  Eliquis (once NG tube placed)    Devices    LINES / TUBES:      Continuous Infusions: . sodium chloride 10 mL/hr (04/04/14 1419)  . dextrose 50 mL/hr at 04/03/14 1600  . feeding supplement (JEVITY 1.2 CAL) 1,000 mL (04/05/14 0524)  . heparin 900 Units/hr (04/05/14 1325)    Objective: VITAL SIGNS: Temp: 97.8 F (36.6 C) (01/17 1210) Temp Source: Axillary (01/17 1210) BP: 154/96 mmHg (01/17 1421) Pulse Rate: 59 (01/17 1421) SPO2; FIO2:   Intake/Output Summary (Last 24 hours) at 04/05/14 1431 Last data filed at  04/05/14 1018  Gross per 24 hour  Intake  997.3 ml  Output    500 ml  Net  497.3 ml     Exam: General: A/O, responds to commands, eyes are spontaneously open, will track you around room,  No acute respiratory distress, patient appears very frustrated that she is unable to communicate. Lungs: Positive diffuse expiratory wheezing, negative crackles  Cardiovascular: Regular rate and rhythm without murmur gallop or rub normal S1 and S2 Abdomen: Nontender, nondistended, soft, bowel sounds positive, no rebound, no ascites, no appreciable mass Extremities: No significant cyanosis, clubbing, or edema bilateral lower extremities Neurologic; cranial nerves II through XII intact, tongue/uvula midline, all extremities strength 5/5, appears to have sensation intact throughout. Follows all commands. Continued global aphasia. .  Data Reviewed: Basic Metabolic Panel:  Recent Labs Lab 04/01/14 1745 04/02/14 0420 04/03/14 1005 04/05/14 0305  NA 143 145 141  --   K 4.3 3.9 4.0  --   CL 105 109 102  --   CO2 --   GLUCOSE 130* 149* 117*  --   BUN --   CREATININE 1.04 0.97 1.03  --   CALCIUM 9.6 9.5 9.1  --   MG  --   --  1.8 2.0  PHOS  --   --   --  3.6   Liver Function Tests:  Recent Labs Lab 04/01/14 1745 04/03/14 1005  AST 38* 36  ALT 15 16  ALKPHOS 36* 35*  BILITOT 0.9 0.8  PROT 7.5 7.3  ALBUMIN  3.9 3.6   No results for input(s): LIPASE, AMYLASE in the last 168 hours. No results for input(s): AMMONIA in the last 168 hours. CBC:  Recent Labs Lab 04/01/14 1745 04/03/14 1005 04/05/14 0305  WBC 5.4 5.6 4.4  NEUTROABS 3.4  --   --   HGB 12.7 12.4 12.3  HCT 38.6 37.5 37.1  MCV 83.9 83.3 82.1  PLT 181 200 205   Cardiac Enzymes:  Recent Labs Lab 04/01/14 1745 04/01/14 2210 04/02/14 0420 04/02/14 1000  TROPONINI 0.25* 0.27* 0.28* 0.28*   BNP (last 3 results) No results for input(s): PROBNP in the last 8760 hours. CBG:  Recent Labs Lab  04/04/14 2355 04/05/14 0436 04/05/14 0759 04/05/14 1205 04/05/14 1421  GLUCAP 104* 122* 131* 66* 123*    Recent Results (from the past 240 hour(s))  MRSA PCR Screening     Status: None   Collection Time: 04/02/14  1:17 AM  Result Value Ref Range Status   MRSA by PCR NEGATIVE NEGATIVE Final    Comment:        The GeneXpert MRSA Assay (FDA approved for NASAL specimens only), is one component of a comprehensive MRSA colonization surveillance program. It is not intended to diagnose MRSA infection nor to guide or monitor treatment for MRSA infections.      Studies:  Recent x-ray studies have been reviewed in detail by the Attending Physician  Scheduled Meds:  Scheduled Meds: . antiseptic oral rinse  7 mL Mouth Rinse q12n4p  . aspirin  300 mg Rectal Daily  . atorvastatin  40 mg Oral q1800  . chlorhexidine  15 mL Mouth Rinse BID  . escitalopram  5 mg Oral Daily  . insulin aspart  0-9 Units Subcutaneous 6 times per day  . loratadine  10 mg Oral Daily  . metoprolol  2.5 mg Intravenous 4 times per day  . multivitamin with minerals  1 tablet Oral Daily    Time spent on care of this patient: 40 mins   Drema Dallas , MD   [facility] Office  [phone] Pager [phone]  On-Call/Text Page:      [email or web address]      password [email or web address]  If 7PM-7AM, please contact night-coverage [email or web address] Password [email or web address] 04/05/2014, 2:31 PM   LOS: 4 days

## 2014-04-05 NOTE — Progress Notes (Signed)
Patient is in no respiratory distress and BiPAP is not needed at this time. 

## 2014-04-05 NOTE — Progress Notes (Signed)
CBG dropped to 66 D50 ,half amp given per protocol ,MD made aware.

## 2014-04-05 NOTE — Progress Notes (Signed)
NGT removed per order from Dr. Joseph Art d/t incorrect placement. No s/s of acute distress noted.

## 2014-04-06 ENCOUNTER — Inpatient Hospital Stay (HOSPITAL_COMMUNITY): Payer: Medicare Other

## 2014-04-06 LAB — CBC
HCT: 41.7 % (ref 36.0–46.0)
Hemoglobin: 13.9 g/dL (ref 12.0–15.0)
MCH: 28.4 pg (ref 26.0–34.0)
MCHC: 33.3 g/dL (ref 30.0–36.0)
MCV: 85.3 fL (ref 78.0–100.0)
PLATELETS: 214 10*3/uL (ref 150–400)
RBC: 4.89 MIL/uL (ref 3.87–5.11)
RDW: 14.8 % (ref 11.5–15.5)
WBC: 4.1 10*3/uL (ref 4.0–10.5)

## 2014-04-06 LAB — GLUCOSE, CAPILLARY
GLUCOSE-CAPILLARY: 125 mg/dL — AB (ref 70–99)
GLUCOSE-CAPILLARY: 122 mg/dL — AB (ref 70–99)
Glucose-Capillary: 123 mg/dL — ABNORMAL HIGH (ref 70–99)
Glucose-Capillary: 132 mg/dL — ABNORMAL HIGH (ref 70–99)
Glucose-Capillary: 145 mg/dL — ABNORMAL HIGH (ref 70–99)
Glucose-Capillary: 93 mg/dL (ref 70–99)

## 2014-04-06 LAB — HEPARIN LEVEL (UNFRACTIONATED): Heparin Unfractionated: 0.68 IU/mL (ref 0.30–0.70)

## 2014-04-06 LAB — MAGNESIUM: Magnesium: 2 mg/dL (ref 1.5–2.5)

## 2014-04-06 LAB — PHOSPHORUS: Phosphorus: 4.1 mg/dL (ref 2.3–4.6)

## 2014-04-06 MED ORDER — RESOURCE THICKENUP CLEAR PO POWD
ORAL | Status: DC | PRN
  Filled 2014-04-06: qty 125

## 2014-04-06 MED ORDER — METOPROLOL TARTRATE 12.5 MG HALF TABLET
12.5000 mg | ORAL_TABLET | Freq: Two times a day (BID) | ORAL | Status: DC
  Administered 2014-04-06 – 2014-04-07 (×2): 12.5 mg via ORAL
  Filled 2014-04-06 (×3): qty 1

## 2014-04-06 MED ORDER — APIXABAN 5 MG PO TABS
5.0000 mg | ORAL_TABLET | Freq: Two times a day (BID) | ORAL | Status: DC
  Administered 2014-04-06 – 2014-04-07 (×2): 5 mg via ORAL
  Filled 2014-04-06 (×3): qty 1

## 2014-04-06 MED ORDER — ASPIRIN 81 MG PO CHEW
81.0000 mg | CHEWABLE_TABLET | Freq: Every day | ORAL | Status: DC
  Administered 2014-04-07: 81 mg via ORAL
  Filled 2014-04-06: qty 1

## 2014-04-06 MED ORDER — ACETAMINOPHEN 325 MG PO TABS: 650.0000 mg | ORAL_TABLET | Freq: Four times a day (QID) | ORAL | Status: DC | PRN

## 2014-04-06 NOTE — Progress Notes (Signed)
Physical Therapy Treatment Patient Details Name: Rebecca [name] MRN: [id number] DOB: July 13, 1945 Today's Date: 04/06/2014    History of Present Illness 69 y.o. female admitted to [facility] on 04/01/14 for syncopal episode at home with resulting aphasia.  Stroke workup in progress.  MRI revealed, "Acute on chronic LEFT frontal lobe infarct (middle cerebral artery territory).  Susceptibility artifact in a pattern suggesting sequela of chronic hypertension with severe white matter changes which may be seen with chronic small vessel ischemic disease. Bilateral basal ganglia and thalamus lacunar infarcts. Bifrontal encephalomalacia, further propagation on the RIGHT, new on the LEFT consistent with interval bilateral posterior cerebral artery territory infarcts."Pt also with HTN, elevated triponin (cardiology consulted and thought this may be demand ischemia).  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    PT Comments    Patient progressing with mobility at this time, tolerated increased activity with use of assistive device today. Demonstrates awareness deficits with mobility, continuous cues for positioning and balance. Will continue to see and progress as tolerated. Continue to feel patient is an ideal candidate for comprehensive inpatient rehabilitations.    Follow Up Recommendations  CIR     Equipment Recommendations  Rolling walker with 5" wheels    Recommendations for Other Services Rehab consult     Precautions / Restrictions Precautions Precautions: Fall Precaution Comments: pt is unstready on her feet.  Restrictions Weight Bearing Restrictions: No    Mobility  Bed Mobility Overal bed mobility: Needs Assistance Bed Mobility: Supine to Sit     Supine to sit: Mod assist     General bed mobility comments: Observed with nursing to come to EOB,   Transfers Overall transfer level: Needs assistance Equipment  used: Rolling walker (2 wheeled) Transfers: Sit to/from Stand Sit to Stand: Min guard         General transfer comment: min guard for stability, patient was able to perform x3 with no physical assist on the 2nd and 3rd transfer  Ambulation/Gait Ambulation/Gait assistance: Min assist;Mod assist Ambulation Distance (Feet): 90 Feet Assistive device: Rolling walker (2 wheeled) Gait Pattern/deviations: Step-through pattern;Scissoring;Staggering left;Staggering right Gait velocity: decreased Gait velocity interpretation: Below normal speed for age/gender General Gait Details: patient with difficulty controlling RW initially, continuously off to the right during mobility with manual assist to bring back in positionin.  Patient with occassional occurance of stepping on her own feet during scissoring, attempted cues- unsuccessful response.     Stairs            Wheelchair Mobility    Modified Rankin (Stroke Patients Only) Modified Rankin (Stroke Patients Only) Pre-Morbid Rankin Score:  (unknown) Modified Rankin: Moderately severe disability     Balance Overall balance assessment: Needs assistance Sitting-balance support: Feet supported Sitting balance-Leahy Scale: Good     Standing balance support: Bilateral upper extremity supported Standing balance-Leahy Scale: Fair                      Cognition Arousal/Alertness: Awake/alert Behavior During Therapy: Restless Overall Cognitive Status: Difficult to assess Area of Impairment: Following commands;Safety/judgement;Problem solving       Following Commands: Follows one step commands consistently;Follows one step commands with increased time;Follows multi-step commands inconsistently     Problem Solving: Slow processing;Difficulty sequencing;Requires verbal cues;Requires tactile cues General Comments: patient responding to commands with verbal and tactile cues    Exercises      General Comments General  comments (skin integrity, edema,  etc.): Verbal cues to direct to task, patient able to performed transfer trials without physical assist, increased cues for general LE ther ex, receptive to verbal and tactile cues, limited with visual cues      Pertinent Vitals/Pain Pain Assessment: Faces Pain Score: 0-No pain Faces Pain Scale: No hurt    Home Living Family/patient expects to be discharged to:: Unsure               Additional Comments: Pt with significant aphasia and unable to report hx.  No family present to report.     Prior Function        Comments: Patient with significant aphasia and unable to report PLOF. No family present.   PT Goals (current goals can now be found in the care plan section) Acute Rehab PT Goals Patient Stated Goal: unable to state PT Goal Formulation: Patient unable to participate in goal setting Time For Goal Achievement: 04/19/14 Potential to Achieve Goals: Good Progress towards PT goals: Progressing toward goals    Frequency  Min 4X/week    PT Plan Current plan remains appropriate    Co-evaluation             End of Session Equipment Utilized During Treatment: Gait belt Activity Tolerance: Patient limited by fatigue;Other Patient left: in chair;with call bell/phone within reach;with chair alarm set (with SLP)     Time: 5462-7035 PT Time Calculation (min) (ACUTE ONLY): 25 min  Charges:  $Gait Training: 8-22 mins $Therapeutic Activity: 8-22 mins                    G CodesFabio Asa 04-24-14, 11:04 AM Charlotte Crumb, PT DPT  [phone]

## 2014-04-06 NOTE — Progress Notes (Signed)
Patient had a 5 beat run vtach and nonsustained ventricular bigeminy. Notified MD and EKG obtained.  Will continue to monitor

## 2014-04-06 NOTE — Progress Notes (Addendum)
Speech Language Pathology Treatment: Dysphagia;Cognitive-Linquistic  Patient Details Name: Rebecca [name] MRN: [id number] DOB: May 08, 1945 Today's Date: 04/06/2014 Time: 1103-1594 SLP Time Calculation (min) (ACUTE ONLY): 53 min  Assessment / Plan / Recommendation Clinical Impression  SLP provided therapeutic intervention and moderate contextual and tactile cues to improve automaticity with PO intake. Pt was able to self feed puree, initially with limited ability to initiate moving spoon tot lips and opening mouth. With tactile cues, pt became very automatic and only needed min tactile cues to initiate spoon to puree. Pts function appears consistent with apraxic impairment.  Significant concern for aspiration with liquids; recommend MBS to determine best diet textures.   Also provided max cues to facilitate auditory comprehension with single and multi-step tasks. Pt again needs extra time and sometimes tactile cues to initiate movements for commands, but appears to comprehend 1-2 step commands well. She was unable to give clear Y/N responses via head nod with max cues. Confrontational oral and head movements appear most difficult for pt. 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 Attempted automatic tasks such as counting spoons and Melodic Intonation Therapy techniques with no significant response. Will continue efforts. Hopeful for CIR soon.    HPI HPI: 69 y.o. 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 MRI Acute on chronic LEFT frontal lobe infarct (middle cerebral artery territory. Bilateral basal ganglia and thalamus lacunar infarcts. Recently hospitalized with stroke with expressive aphasia and mild weakness on the right  side. Failed RN stroke swallow scren.   Pertinent Vitals Pain Assessment: Faces Faces Pain Scale: No hurt  SLP Plan  Continue with current plan of care    Recommendations Diet recommendations: NPO              Oral Care Recommendations: Oral care Q4 per protocol Follow up Recommendations: Inpatient Rehab Plan: Continue with current plan of care    GO    [facility], MA CCC-SLP [phone]  [name] 04/06/2014, 1:19 PM

## 2014-04-06 NOTE — Progress Notes (Signed)
Rehab Admissions Coordinator Note:  Patient was screened by Trish Mage for appropriateness for an Inpatient Acute Rehab Consult.  At this time, we are recommending Inpatient Rehab consult.  Trish Mage 04/06/2014, 9:31 AM  I can be reached at [phone].

## 2014-04-06 NOTE — Progress Notes (Signed)
SUBJECTIVE:  Shrugs shoulders and does not indicate dyspnea or pain  PHYSICAL EXAM Filed Vitals:   04/05/14 1658 04/05/14 1944 04/05/14 2332 04/06/14 0408  BP: 130/93 156/90 124/85 147/99  Pulse: 62 61 60 63  Temp: 98.8 F (37.1 C) 97.3 F (36.3 C) 97.9 F (36.6 C) 98 F (36.7 C)  TempSrc: Axillary Axillary Axillary Axillary  Resp: Height:      Weight:      SpO2: 100% 100% 100% 94%   General:  No distress Lungs:  Clear with decreased breath sounds Heart:  RRR Abdomen:  Positive bowel sounds, no rebound no guarding Extremities:  No edema   LABS:  Results for orders placed or performed during the hospital encounter of 04/01/14 (from the past 24 hour(s))  Glucose, capillary     Status: Abnormal   Collection Time: 04/05/14  7:59 AM  Result Value Ref Range   Glucose-Capillary 131 (H) 70 - 99 mg/dL  Glucose, capillary     Status: Abnormal   Collection Time: 04/05/14 12:05 PM  Result Value Ref Range   Glucose-Capillary 66 (L) 70 - 99 mg/dL  Glucose, capillary     Status: Abnormal   Collection Time: 04/05/14  2:21 PM  Result Value Ref Range   Glucose-Capillary 123 (H) 70 - 99 mg/dL  Glucose, capillary     Status: None   Collection Time: 04/05/14  4:56 PM  Result Value Ref Range   Glucose-Capillary 92 70 - 99 mg/dL  Glucose, capillary     Status: Abnormal   Collection Time: 04/05/14  7:46 PM  Result Value Ref Range   Glucose-Capillary 100 (H) 70 - 99 mg/dL  Glucose, capillary     Status: Abnormal   Collection Time: 04/06/14 12:00 AM  Result Value Ref Range   Glucose-Capillary 132 (H) 70 - 99 mg/dL  Heparin level (unfractionated)     Status: None   Collection Time: 04/06/14  3:59 AM  Result Value Ref Range   Heparin Unfractionated 0.68 0.30 - 0.70 IU/mL  CBC     Status: None   Collection Time: 04/06/14  3:59 AM  Result Value Ref Range   WBC 4.1 4.0 - 10.5 K/uL   RBC 4.89 3.87 - 5.11 MIL/uL   Hemoglobin 13.9 12.0 - 15.0 g/dL   HCT 16.1 09.6 - 04.5  %   MCV 85.3 78.0 - 100.0 fL   MCH 28.4 26.0 - 34.0 pg   MCHC 33.3 30.0 - 36.0 g/dL   RDW 40.9 81.1 - 91.4 %   Platelets 214 150 - 400 K/uL  Phosphorus     Status: None   Collection Time: 04/06/14  3:59 AM  Result Value Ref Range   Phosphorus 4.1 2.3 - 4.6 mg/dL  Magnesium     Status: None   Collection Time: 04/06/14  3:59 AM  Result Value Ref Range   Magnesium 2.0 1.5 - 2.5 mg/dL  Glucose, capillary     Status: Abnormal   Collection Time: 04/06/14  4:07 AM  Result Value Ref Range   Glucose-Capillary 125 (H) 70 - 99 mg/dL    Intake/Output Summary (Last 24 hours) at 04/06/14 0740 Last data filed at 04/06/14 0500  Gross per 24 hour  Intake    833 ml  Output    300 ml  Net    533 ml    ASSESSMENT AND PLAN:  PAF:  Eliquis when she passes her swallow study.  Currently maintaining NSR.  ELEVATED TROPONIN:  Mild troponin bump without new all motion abnormality on echo.  No plans for further noninvasive evaluation.    ACUTE ON CHRONIC DIASTOLIC HF:   Weight was down yesterday if this is accurate.  She seems to be euvolemic.  No change in meds.    Rollene Rotunda 04/06/2014 7:40 AM

## 2014-04-06 NOTE — Progress Notes (Addendum)
[facility] TEAM 1 - Stepdown/ICU TEAM Progress Note  Rebecca [name] XOV:[id number] DOB: 21-Aug-1945 DOA: 04/01/2014 PCP: Burtis Junes, MD  Admit HPI / Brief Narrative: 69 y.o.F w/ Hx depression, hypertension, atrial fibrillation on Xarelto, Fabry disease, tobacco abuse, diabetes mellitus 2 uncontrolled, and stroke, who presented with aphasia.  Patient was recently hospitalized because of stroke and d/c on Xeralto for Afib. At that time she was noted to have expressive aphasia and mild weakness on the right side. Patient was brought to hospital again after her son found patient leaning on the counter "slumped over and not communicating and shivering in all 4 extremities".Patient was found to have oxygen desaturation to 88% on room air, which improved to 100% after placed on BIPAP.  In ED she was awake but nonverbal. She only followed some simple commands. It is difficult to assess whether patient has any pain. Her compliance w/ Xarelto was questioned.    Work up in the ED demonstrates CT-head no bleeding. Chest x-rays negative. No leukocytosis. Her EKG showed PVC with T-wave inversion in lead 1. Trop was elevated at 0.25.   HPI/Subjective: Pt is alert and interactive, but unable to provide a hx due to her aphasia.  She appears comfortable.  She follows commands.    Assessment/Plan:  Aphasia -CT head negative for intracranial bleeding - Neurology consulted -concerning for left brain CVA versus possible seizure -PT consult, OT consult, Speech consult completed - diet begun and CIR recommended  -Carotid dopplers w/o signif stenosis   HTN -BP is currently reasonably controlled - follow w/o change today   Elevated troponin -peaked at 0.28 - Cardiology following - without new all motion abnormality on echo no further eval planning   Acute on chronic combined systolic and diastolic CHF  -Per Dr. 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 -care per Cardiology - appears to be euvolemic today   Pulmonary hypertension  Paroxysmal Afib   -Currently in NSR -Heart rate is well controlled -Eliquis 5 mg BID  Diabetes type 2 controlled -CBG reasonably controlled at present - follow trend   Hyperlipidemia  -LDL was 104 on 03/24/13 -Continue Lipitor 40 mg daily  Fabry disease  -Agalsidase 1 mg/kilogram q 14 days. Last dose per son administered on 8 January   Protein-calorie malnutrition, severe -Continue Jevity 1.2 CAL until clear pt will tolerate near dysphagia diet recommended by SLP   Code Status: FULL Family Communication: no family present at time of exam today  Disposition Plan: SDU - possible transfer to tele bed in AM, w/ ultimate d/c to CIR   Consultants: Dr Georga Hacking Camilo,(Neurology) Dr. Delia Heady (neurology, stroke) Dr. Marca Ancona (cardiology )  Procedure/Significant Events: 1/14 MRI/MRA Brain without contrast;- Acute on chronic LEFT frontal lobe infarct (middle cerebral artery territory). - Bilateral basal ganglia and thalamus lacunar infarcts. -Bifrontal encephalomalacia, further propagation on the RIGHT, new on the LEFT consistent with interval bilateral posterior cerebral artery territory infarcts. -no large vessel occlusion. Slightly decreased flow related enhancement of the LEFT middle cerebral artery.  1/14 EEG: normal asleep EEG. No electrographic seizures noted 1/14 echocardiogram;- Left ventricle: severeconcentric hypertrophy. -LVEF=45% to 50%.  -(grade 2 diastolic dysfunction). - Aortic valve:Moderate regurgitation. - Left atrium: severely dilated. -Right atrium: moderately dilated. -Pulmonary arteries: PApeak pressure: 42 mm Hg (S).  Antibiotics: NA  DVT prophylaxis: Heparin-->  Eliquis  Objective: Blood pressure 134/76,  pulse 61, temperature 98.6 F (37 C),  temperature source Oral, resp. rate 22, height  (1.626 m), weight 58.605 kg (129 lb 3.2 oz), SpO2 96 %.  Intake/Output Summary (Last 24 hours) at 04/06/14 1158 Last data filed at 04/06/14 0900  Gross per 24 hour  Intake    804 ml  Output    300 ml  Net    504 ml   Exam: General: No acute respiratory distress, alert and interactive  Lungs: CTA th/o w/o wheeze  Cardiovascular: Regular rate and rhythm without murmur gallop or rub  Abdomen: Nontender, nondistended, soft, bowel sounds positive, no rebound, no ascites, no appreciable mass Extremities: No significant cyanosis, clubbing, or edema bilateral lower extremities Neurologic:  Continued expressive aphasia  Data Reviewed: Basic Metabolic Panel:  Recent Labs Lab 04/01/14 1745 04/02/14 0420 04/03/14 1005 04/05/14 0305 04/06/14 0359  NA 143 145 141  --   --   K 4.3 3.9 4.0  --   --   CL 105 109 102  --   --   CO2 --   --   GLUCOSE 130* 149* 117*  --   --   BUN --   --   CREATININE 1.04 0.97 1.03  --   --   CALCIUM 9.6 9.5 9.1  --   --   MG  --   --  1.8 2.0 2.0  PHOS  --   --   --  3.6 4.1   Liver Function Tests:  Recent Labs Lab 04/01/14 1745 04/03/14 1005  AST 38* 36  ALT 15 16  ALKPHOS 36* 35*  BILITOT 0.9 0.8  PROT 7.5 7.3  ALBUMIN 3.9 3.6   CBC:  Recent Labs Lab 04/01/14 1745 04/03/14 1005 04/05/14 0305 04/06/14 0359  WBC 5.4 5.6 4.4 4.1  NEUTROABS 3.4  --   --   --   HGB 12.7 12.4 12.3 13.9  HCT 38.6 37.5 37.1 41.7  MCV 83.9 83.3 82.1 85.3  PLT 181 200 205 214   Cardiac Enzymes:  Recent Labs Lab 04/01/14 1745 04/01/14 2210 04/02/14 0420 04/02/14 1000  TROPONINI 0.25* 0.27* 0.28* 0.28*   CBG:  Recent Labs Lab 04/05/14 1656 04/05/14 1946 04/06/14 04/06/14 0407 04/06/14 0831  GLUCAP 92 100* 132* 125* 122*    Recent Results (from the past 240 hour(s))  MRSA PCR Screening     Status: None   Collection Time: 04/02/14  1:17 AM  Result Value Ref Range  Status   MRSA by PCR NEGATIVE NEGATIVE Final    Comment:        The GeneXpert MRSA Assay (FDA approved for NASAL specimens only), is one component of a comprehensive MRSA colonization surveillance program. It is not intended to diagnose MRSA infection nor to guide or monitor treatment for MRSA infections.      Studies:  Recent x-ray studies have been reviewed in detail by the Attending Physician  Scheduled Meds:  Scheduled Meds: . antiseptic oral rinse  7 mL Mouth Rinse q12n4p  . aspirin  300 mg Rectal Daily  . atorvastatin  40 mg Oral q1800  . chlorhexidine  15 mL Mouth Rinse BID  . escitalopram  5 mg Oral Daily  . insulin aspart  0-9 Units Subcutaneous 6 times per day  . loratadine  10 mg Oral Daily  . metoprolol  2.5 mg Intravenous 4 times per day  . multivitamin with minerals  1 tablet Oral Daily  Time spent on care of this patient: 35 mins  Lonia Blood, MD [facility] For Consults/Admissions - Flow Manager [phone] Office  [phone]  Contact MD directly via text page:      [email or web address]      password [facility]  04/06/2014, 11:58 AM   LOS: 5 days

## 2014-04-06 NOTE — Progress Notes (Signed)
Occupational Therapy Evaluation Patient Details Name: SHEMAIAH DEMARS MRN: [id number] DOB: Feb 11, 1946 Today's Date: 04/06/2014    History of Present Illness 69 y.o. female admitted to [facility] on 04/01/14 for syncopal episode at home with resulting aphasia.  Stroke workup in progress.  MRI revealed, "Acute on chronic LEFT frontal lobe infarct (middle cerebral artery territory).  Susceptibility artifact in a pattern suggesting sequela of chronic hypertension with severe white matter changes which may be seen with chronic small vessel ischemic disease. Bilateral basal ganglia and thalamus lacunar infarcts. Bifrontal encephalomalacia, further propagation on the RIGHT, new on the LEFT consistent with interval bilateral posterior cerebral artery territory infarcts."Pt also with HTN, elevated triponin (cardiology consulted and thought this may be demand ischemia).  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   Clinical Impression   Patient presents to OT with decreased ADL independence and safety due to the deficits listed below. She will benefit from skilled OT to maximize independence and to facilitate a safe discharge plan. Feel patient will benefit from intensive rehab program. Recommend CIR. OT will follow.    Follow Up Recommendations  CIR    Equipment Recommendations  Other (comment) (to be determined)    Recommendations for Other Services Rehab consult     Precautions / Restrictions Precautions Precautions: Fall      Mobility Bed Mobility                  Transfers                      Balance                                            ADL Overall ADL's : Needs assistance/impaired Eating/Feeding: NPO   Grooming: Wash/dry hands;Wash/dry face;Oral care;Moderate assistance Grooming Details (indicate cue type and reason): hand over hand to initiate washing hands with cloth, then  patient perseverating on task. Patient unable to follow verbal/visual/tactile cues to wash face. Mod A to swab mouth with toothette after verbal and hand over hand initiation of task. Upper Body Bathing: Total assistance Upper Body Bathing Details (indicate cue type and reason): Attempted to use verbal/visual/tactile cues and hand over hand to initiate this task but patient unable to follow. Lower Body Bathing: Total assistance                       Functional mobility during ADLs:  (mod A supine to long sit in the bed) General ADL Comments: Patient limited by global aphasia, decreased motor planning, apraxia, difficulty processing information and following commands. She does move her arms spontaneously in bed and was able to wash hands once given verbal/visual/tactile cues but then became perseverative. Unable to wash face or perform UB bathing. Swabbed mouth with toothette and verbal/visual/tactile cues with initial hand over hand and mod A for thoroughness.     Vision                 Additional Comments: Formal testing difficult due to global apahasia. Able to track to left without difficulty. Can track to R with cues but difficult to sustain gaze. Further testing warranted.   Perception     Praxis Praxis Praxis tested?: Deficits Deficits: Perseveration;Organization    Pertinent Vitals/Pain Pain Assessment: Faces Pain Score: 0-No  pain     Hand Dominance  (unknown and patient unable to state)   Extremity/Trunk Assessment Upper Extremity Assessment Upper Extremity Assessment: Difficult to assess due to impaired cognition (PROM WFL, does move arms against gravity)   Lower Extremity Assessment Lower Extremity Assessment: Defer to PT evaluation   Cervical / Trunk Assessment Cervical / Trunk Assessment: Normal   Communication Communication Communication: Receptive difficulties;Expressive difficulties;Other (comment)   Cognition Arousal/Alertness:  Awake/alert Behavior During Therapy: Restless Overall Cognitive Status: Difficult to assess                     General Comments       Exercises       Shoulder Instructions      Home Living Family/patient expects to be discharged to:: Unsure                                 Additional Comments: Pt with significant aphasia and unable to report hx.  No family present to report.       Prior Functioning/Environment          Comments: Patient with significant aphasia and unable to report PLOF. No family present.    OT Diagnosis: Generalized weakness;Apraxia;Cognitive deficits   OT Problem List: Decreased strength;Decreased activity tolerance;Impaired balance (sitting and/or standing);Impaired vision/perception;Decreased cognition;Decreased knowledge of use of DME or AE   OT Treatment/Interventions: Self-care/ADL training;Neuromuscular education;DME and/or AE instruction;Therapeutic activities;Cognitive remediation/compensation;Visual/perceptual remediation/compensation;Patient/family education;Balance training    OT Goals(Current goals can be found in the care plan section) Acute Rehab OT Goals Patient Stated Goal: unable to state OT Goal Formulation: Patient unable to participate in goal setting Time For Goal Achievement: 04/20/14 Potential to Achieve Goals: Good  OT Frequency: Min 3X/week   Barriers to D/C:            Co-evaluation              End of Session    Activity Tolerance: Patient tolerated treatment well Patient left: in bed;with call bell/phone within reach   Time: 0804-0824 OT Time Calculation (min): 20 min Charges:  OT General Charges $OT Visit: 1 Procedure OT Evaluation $Initial OT Evaluation Tier I: 1 Procedure OT Treatments $Self Care/Home Management : 8-22 mins G-Codes:    Emileigh Kellett A 2014/04/19, 9:10 AM

## 2014-04-06 NOTE — Progress Notes (Signed)
ANTICOAGULATION CONSULT NOTE - Follow Up Consult  Pharmacy Consult for Heparin Indication: atrial fibrillation  No Known Allergies  Patient Measurements: Height: 5\' 4"  (162.6 cm) Weight: 129 lb 3.2 oz (58.605 kg) IBW/kg (Calculated) : 54.7 Heparin Dosing Weight: 62.6 kg  Vital Signs: Temp: 98 F (36.7 C) (01/18 0408) Temp Source: Axillary (01/18 0408) BP: 147/99 mmHg (01/18 0408) Pulse Rate: 63 (01/18 0408)  Labs:  Recent Labs  04/03/14 1005 04/04/14 1915 04/05/14 0305 04/06/14 0359  HGB 12.4  --  12.3 13.9  HCT 37.5  --  37.1 41.7  PLT 200  --  205 214  HEPARINUNFRC  --  0.53 0.49 0.68  CREATININE 1.03  --   --   --     Estimated Creatinine Clearance: 44.5 mL/min (by C-G formula based on Cr of 1.03).  Assessment: 69 yo F presents on 1/13 with aphasia. PMH pertinent for Afib, HTN, DM, stroke, depression. Transitioned to Eliquis for Xarelto this hospital visit but unable to pass swallow eval. Plan is to place NG tube but will cover with heparin until then. HL therapeutic at 0.68. CBC stable. No s/s of bleed.  Goal of Therapy:  Heparin level 0.3-0.7 units/ml Monitor platelets by anticoagulation protocol: Yes   Plan:  Continue Heparin gtt at 900 units/hr Hold apixaban Monitor daily HL, CBC, s/s of bleed F/U swallow eval or NG tube placement for apixaban restart  Link Snuffer, PharmD, BCPS Clinical Pharmacist [phone]  04/06/2014,7:44 AM

## 2014-04-06 NOTE — Procedures (Signed)
Objective Swallowing Evaluation: Modified Barium Swallowing Study  Patient Details  Name: Rebecca [name] MRN: [id number] Date of Birth: 1945-04-18  Today's Date: 04/06/2014 Time: SLP Start Time (ACUTE ONLY): 1330-SLP Stop Time (ACUTE ONLY): 1400 SLP Time Calculation (min) (ACUTE ONLY): 30 min  Past Medical History:  Past Medical History  Diagnosis Date  . Hypertension   . 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 Expressive aphasia 03/23/2013  . Atrial fibrillation 05/01/2013  . Tobacco abuse 05/01/2013  . Diabetes mellitus without complication   . Depression   . Stroke     2015   Past Surgical History:  Past Surgical History  Procedure Laterality Date  . Tee without cardioversion N/A 03/26/2013    Procedure: TRANSESOPHAGEAL ECHOCARDIOGRAM (TEE);  Surgeon: Thurmon Fair, MD;  Location: [facility] ENDOSCOPY;  Service: Cardiovascular;  Laterality: N/A;  . Cesarean section      x3  . Hip replacement Right 1990's  . Hip replacemet Left 1990's   HPI:  HPI: 69 y.o. 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 MRI Acute on chronic LEFT frontal lobe infarct (middle cerebral artery territory. Bilateral basal ganglia and thalamus lacunar infarcts. Recently hospitalized with stroke with expressive aphasia and mild weakness on the right side. Failed RN stroke swallow scren.  No Data Recorded  Assessment / Plan / Recommendation CHL IP CLINICAL IMPRESSIONS 04/06/2014  Dysphagia Diagnosis Moderate pharyngeal phase dysphagia;Moderate oral phase dysphagia  Clinical impression Pt demonstrates a primary oral phase dysphagia with reduced ability to initiate oral movements for PO intake. Pt requires moderate assist for cup to mouth and appropriate head posture (tilts  head back). There is right sided oral weakness with moderate residuals remaining post swallow. Moderate sensorimotor oropharyngeal impairment is characterized by significantly delayed swallow response and pt silently aspirates thin liquids before the swallow (trace). Suspect larger amount of aspirate would elicit strong cough as seen at bedside. Pt tolerates nectar thick liquids and purees without penetration or aspiration though bolus size does need to be supervised to decrease risk. Pt should also be cued to attempt transit of oral residuals with second swallow, or suction to right side should be applied intermittently during meals. Recommend initiating a Dys 1 (puree) diet with nectar thick liquids with full supervision.       CHL IP TREATMENT RECOMMENDATION 04/06/2014  Treatment Plan Recommendations Therapy as outlined in treatment plan below     CHL IP DIET RECOMMENDATION 04/06/2014  Diet Recommendations Dysphagia 1 (Puree);Nectar-thick liquid  Liquid Administration via Cup  Medication Administration Whole meds with puree  Compensations Slow rate;Small sips/bites;Check for pocketing;Check for anterior loss;Multiple dry swallows after each bite/sip  Postural Changes and/or Swallow Maneuvers Seated upright 90 degrees;Out of bed for meals     CHL IP OTHER RECOMMENDATIONS 04/06/2014  Recommended Consults (None)  Oral Care Recommendations Oral care BID  Other Recommendations Order thickener from pharmacy;Have oral suction available     CHL IP FOLLOW UP RECOMMENDATIONS 04/06/2014  Follow up Recommendations Inpatient Rehab     CHL IP FREQUENCY AND DURATION 04/06/2014  Speech Therapy Frequency (ACUTE ONLY) min 2x/week  Treatment Duration 2 weeks     Pertinent Vitals/Pain NA    SLP Swallow Goals No flowsheet data found.  No flowsheet data found.    CHL IP REASON FOR REFERRAL 04/06/2014  Reason  for Referral Objectively evaluate swallowing function     CHL IP ORAL PHASE 04/06/2014  Lips  (None)  Tongue (None)  Mucous membranes (None)  Nutritional status (None)  Other (None)  Oxygen therapy (None)  Oral Phase Impaired  Oral - Pudding Teaspoon (None)  Oral - Pudding Cup (None)  Oral - Honey Teaspoon (None)  Oral - Honey Cup (None)  Oral - Honey Syringe (None)  Oral - Nectar Teaspoon (None)  Oral - Nectar Cup Right anterior bolus loss;Weak lingual manipulation;Lingual pumping;Reduced posterior propulsion;Right pocketing in lateral sulci;Lingual/palatal residue;Delayed oral transit  Oral - Nectar Straw Other (Comment)  Oral - Nectar Syringe (None)  Oral - Ice Chips (None)  Oral - Thin Teaspoon (None)  Oral - Thin Cup Right anterior bolus loss;Weak lingual manipulation;Lingual pumping;Reduced posterior propulsion;Right pocketing in lateral sulci;Lingual/palatal residue;Delayed oral transit  Oral - Thin Straw (None)  Oral - Thin Syringe (None)  Oral - Puree Right anterior bolus loss;Weak lingual manipulation;Lingual pumping;Reduced posterior propulsion;Right pocketing in lateral sulci;Delayed oral transit  Oral - Mechanical Soft (None)  Oral - Regular Right anterior bolus loss;Weak lingual manipulation;Lingual pumping;Reduced posterior propulsion;Right pocketing in lateral sulci;Lingual/palatal residue;Delayed oral transit;Impaired mastication  Oral - Multi-consistency (None)  Oral - Pill Weak lingual manipulation;Lingual pumping;Reduced posterior propulsion;Right pocketing in lateral sulci;Delayed oral transit  Oral Phase - Comment (None)      CHL IP PHARYNGEAL PHASE 04/06/2014  Pharyngeal Phase Impaired  Pharyngeal - Pudding Teaspoon (None)  Penetration/Aspiration details (pudding teaspoon) (None)  Pharyngeal - Pudding Cup (None)  Penetration/Aspiration details (pudding cup) (None)  Pharyngeal - Honey Teaspoon (None)  Penetration/Aspiration details (honey teaspoon) (None)  Pharyngeal - Honey Cup (None)  Penetration/Aspiration details (honey cup) (None)  Pharyngeal -  Honey Syringe (None)  Penetration/Aspiration details (honey syringe) (None)  Pharyngeal - Nectar Teaspoon (None)  Penetration/Aspiration details (nectar teaspoon) (None)  Pharyngeal - Nectar Cup Delayed swallow initiation;Pharyngeal residue - valleculae  Penetration/Aspiration details (nectar cup) Material does not enter airway  Pharyngeal - Nectar Straw (None)  Penetration/Aspiration details (nectar straw) (None)  Pharyngeal - Nectar Syringe (None)  Penetration/Aspiration details (nectar syringe) (None)  Pharyngeal - Ice Chips (None)  Penetration/Aspiration details (ice chips) (None)  Pharyngeal - Thin Teaspoon (None)  Penetration/Aspiration details (thin teaspoon) (None)  Pharyngeal - Thin Cup Delayed swallow initiation;Penetration/Aspiration before swallow;Trace aspiration;Pharyngeal residue - valleculae  Penetration/Aspiration details (thin cup) Material enters airway, passes BELOW cords without attempt by patient to eject out (silent aspiration)  Pharyngeal - Thin Straw (None)  Penetration/Aspiration details (thin straw) (None)  Pharyngeal - Thin Syringe (None)  Penetration/Aspiration details (thin syringe') (None)  Pharyngeal - Puree Delayed swallow initiation  Penetration/Aspiration details (puree) (None)  Pharyngeal - Mechanical Soft (None)  Penetration/Aspiration details (mechanical soft) (None)  Pharyngeal - Regular Delayed swallow initiation  Penetration/Aspiration details (regular) (None)  Pharyngeal - Multi-consistency (None)  Penetration/Aspiration details (multi-consistency) (None)  Pharyngeal - Pill Delayed swallow initiation  Penetration/Aspiration details (pill) (None)  Pharyngeal Comment (None)     No flowsheet data found.  No flowsheet data found.        [name], MA CCC-SLP [phone]  [name] 04/06/2014, 3:01 PM

## 2014-04-07 ENCOUNTER — Inpatient Hospital Stay (HOSPITAL_COMMUNITY)
Admission: RE | Admit: 2014-04-07 | Discharge: 2014-05-01 | DRG: 056 | Disposition: A | Discharge status: Skilled Nursing Facility | Payer: Medicare Other | Source: Intra-hospital | Attending: Physical Medicine & Rehabilitation | Admitting: Physical Medicine & Rehabilitation

## 2014-04-07 DIAGNOSIS — I10 Essential (primary) hypertension: Secondary | ICD-10-CM | POA: Diagnosis present

## 2014-04-07 DIAGNOSIS — F17200 Nicotine dependence, unspecified, uncomplicated: Secondary | ICD-10-CM | POA: Diagnosis present

## 2014-04-07 DIAGNOSIS — B9620 Unspecified Escherichia coli [E. coli] as the cause of diseases classified elsewhere: Secondary | ICD-10-CM | POA: Diagnosis not present

## 2014-04-07 DIAGNOSIS — R627 Adult failure to thrive: Secondary | ICD-10-CM | POA: Diagnosis present

## 2014-04-07 DIAGNOSIS — I69351 Hemiplegia and hemiparesis following cerebral infarction affecting right dominant side: Principal | ICD-10-CM

## 2014-04-07 DIAGNOSIS — I5033 Acute on chronic diastolic (congestive) heart failure: Secondary | ICD-10-CM | POA: Diagnosis present

## 2014-04-07 DIAGNOSIS — E46 Unspecified protein-calorie malnutrition: Secondary | ICD-10-CM | POA: Diagnosis not present

## 2014-04-07 DIAGNOSIS — I69392 Facial weakness following cerebral infarction: Secondary | ICD-10-CM

## 2014-04-07 DIAGNOSIS — Z96643 Presence of artificial hip joint, bilateral: Secondary | ICD-10-CM | POA: Diagnosis present

## 2014-04-07 DIAGNOSIS — I69391 Dysphagia following cerebral infarction: Secondary | ICD-10-CM | POA: Diagnosis not present

## 2014-04-07 DIAGNOSIS — I69390 Apraxia following cerebral infarction: Secondary | ICD-10-CM | POA: Diagnosis not present

## 2014-04-07 DIAGNOSIS — R1312 Dysphagia, oropharyngeal phase: Secondary | ICD-10-CM | POA: Diagnosis present

## 2014-04-07 DIAGNOSIS — R4701 Aphasia: Secondary | ICD-10-CM

## 2014-04-07 DIAGNOSIS — F329 Major depressive disorder, single episode, unspecified: Secondary | ICD-10-CM | POA: Diagnosis present

## 2014-04-07 DIAGNOSIS — I69320 Aphasia following cerebral infarction: Secondary | ICD-10-CM | POA: Diagnosis not present

## 2014-04-07 DIAGNOSIS — Z4659 Encounter for fitting and adjustment of other gastrointestinal appliance and device: Secondary | ICD-10-CM

## 2014-04-07 DIAGNOSIS — E1142 Type 2 diabetes mellitus with diabetic polyneuropathy: Secondary | ICD-10-CM | POA: Diagnosis present

## 2014-04-07 DIAGNOSIS — R1310 Dysphagia, unspecified: Secondary | ICD-10-CM

## 2014-04-07 DIAGNOSIS — I4891 Unspecified atrial fibrillation: Secondary | ICD-10-CM | POA: Diagnosis present

## 2014-04-07 DIAGNOSIS — N390 Urinary tract infection, site not specified: Secondary | ICD-10-CM | POA: Diagnosis not present

## 2014-04-07 DIAGNOSIS — E785 Hyperlipidemia, unspecified: Secondary | ICD-10-CM | POA: Diagnosis present

## 2014-04-07 DIAGNOSIS — Z7189 Other specified counseling: Secondary | ICD-10-CM | POA: Insufficient documentation

## 2014-04-07 DIAGNOSIS — I639 Cerebral infarction, unspecified: Secondary | ICD-10-CM

## 2014-04-07 DIAGNOSIS — Z431 Encounter for attention to gastrostomy: Secondary | ICD-10-CM

## 2014-04-07 DIAGNOSIS — E7521 Fabry (-Anderson) disease: Secondary | ICD-10-CM | POA: Diagnosis present

## 2014-04-07 DIAGNOSIS — I63512 Cerebral infarction due to unspecified occlusion or stenosis of left middle cerebral artery: Secondary | ICD-10-CM

## 2014-04-07 LAB — GLUCOSE, CAPILLARY
GLUCOSE-CAPILLARY: 100 mg/dL — AB (ref 70–99)
GLUCOSE-CAPILLARY: 88 mg/dL (ref 70–99)
Glucose-Capillary: 113 mg/dL — ABNORMAL HIGH (ref 70–99)
Glucose-Capillary: 86 mg/dL (ref 70–99)
Glucose-Capillary: 111 mg/dL — ABNORMAL HIGH (ref 70–99)

## 2014-04-07 LAB — COMPREHENSIVE METABOLIC PANEL
ALBUMIN: 3 g/dL — AB (ref 3.5–5.2)
ALK PHOS: 34 U/L — AB (ref 39–117)
ALT: 15 U/L (ref 0–35)
AST: 28 U/L (ref 0–37)
Anion gap: 8 (ref 5–15)
BUN: 20 mg/dL (ref 6–23)
CO2: 31 mmol/L (ref 19–32)
CREATININE: 0.9 mg/dL (ref 0.50–1.10)
Calcium: 8.9 mg/dL (ref 8.4–10.5)
Chloride: 105 mEq/L (ref 96–112)
GFR calc Af Amer: 74 mL/min — ABNORMAL LOW (ref >90)
GFR, EST NON AFRICAN AMERICAN: 64 mL/min — AB (ref >90)
Glucose, Bld: 96 mg/dL (ref 70–99)
Potassium: 3.9 mmol/L (ref 3.5–5.1)
SODIUM: 144 mmol/L (ref 135–145)
TOTAL PROTEIN: 6.4 g/dL (ref 6.0–8.3)
Total Bilirubin: 0.7 mg/dL (ref 0.3–1.2)

## 2014-04-07 MED ORDER — ADULT MULTIVITAMIN W/MINERALS CH
1.0000 | ORAL_TABLET | Freq: Every day | ORAL | Status: DC
  Administered 2014-04-08 – 2014-04-09 (×2): 1 via ORAL
  Filled 2014-04-07 (×4): qty 1

## 2014-04-07 MED ORDER — POLYVINYL ALCOHOL 1.4 % OP SOLN: 1.0000 [drp] | OPHTHALMIC | Status: DC | PRN

## 2014-04-07 MED ORDER — ONDANSETRON HCL 4 MG PO TABS: 4.0000 mg | ORAL_TABLET | Freq: Four times a day (QID) | ORAL | Status: DC | PRN

## 2014-04-07 MED ORDER — ESCITALOPRAM OXALATE 5 MG PO TABS
5.0000 mg | ORAL_TABLET | Freq: Every day | ORAL | Status: DC
  Administered 2014-04-08 – 2014-05-01 (×22): 5 mg via ORAL
  Filled 2014-04-07 (×27): qty 1

## 2014-04-07 MED ORDER — SENNOSIDES-DOCUSATE SODIUM 8.6-50 MG PO TABS: 1.0000 | ORAL_TABLET | Freq: Every evening | ORAL | Status: DC | PRN

## 2014-04-07 MED ORDER — ACETAMINOPHEN 325 MG PO TABS: 650.0000 mg | ORAL_TABLET | Freq: Four times a day (QID) | ORAL | Status: DC | PRN

## 2014-04-07 MED ORDER — CETYLPYRIDINIUM CHLORIDE 0.05 % MT LIQD
7.0000 mL | Freq: Two times a day (BID) | OROMUCOSAL | Status: DC
  Administered 2014-04-08 – 2014-04-30 (×28): 7 mL via OROMUCOSAL

## 2014-04-07 MED ORDER — ACETAMINOPHEN 325 MG PO TABS
325.0000 mg | ORAL_TABLET | ORAL | Status: DC | PRN
  Administered 2014-04-08 – 2014-04-09 (×2): 650 mg via ORAL
  Filled 2014-04-07 (×3): qty 2

## 2014-04-07 MED ORDER — APIXABAN 5 MG PO TABS
5.0000 mg | ORAL_TABLET | Freq: Two times a day (BID) | ORAL | Status: DC
  Administered 2014-04-07 – 2014-04-14 (×15): 5 mg via ORAL
  Filled 2014-04-07 (×19): qty 1

## 2014-04-07 MED ORDER — METOPROLOL TARTRATE 25 MG PO TABS: 12.5000 mg | ORAL_TABLET | Freq: Two times a day (BID) | ORAL | Status: DC

## 2014-04-07 MED ORDER — SORBITOL 70 % SOLN
30.0000 mL | Freq: Every day | Status: DC | PRN
  Administered 2014-04-14 – 2014-04-25 (×3): 30 mL via ORAL
  Filled 2014-04-07 (×3): qty 30

## 2014-04-07 MED ORDER — APIXABAN 5 MG PO TABS: 5.0000 mg | ORAL_TABLET | Freq: Two times a day (BID) | ORAL | Status: DC

## 2014-04-07 MED ORDER — CHLORHEXIDINE GLUCONATE 0.12 % MT SOLN
15.0000 mL | Freq: Two times a day (BID) | OROMUCOSAL | Status: DC
  Administered 2014-04-07 – 2014-05-01 (×38): 15 mL via OROMUCOSAL
  Filled 2014-04-07 (×52): qty 15

## 2014-04-07 MED ORDER — LORATADINE 10 MG PO TABS
10.0000 mg | ORAL_TABLET | Freq: Every day | ORAL | Status: DC
  Administered 2014-04-08 – 2014-05-01 (×22): 10 mg via ORAL
  Filled 2014-04-07 (×29): qty 1

## 2014-04-07 MED ORDER — RESOURCE THICKENUP CLEAR PO POWD
ORAL | Status: DC | PRN
  Filled 2014-04-07: qty 125

## 2014-04-07 MED ORDER — JEVITY 1.2 CAL PO LIQD
1000.0000 mL | ORAL | Status: DC
Start: 2014-04-07 — End: 2014-09-22

## 2014-04-07 MED ORDER — ONDANSETRON HCL 4 MG/2ML IJ SOLN: 4.0000 mg | Freq: Four times a day (QID) | INTRAMUSCULAR | Status: DC | PRN

## 2014-04-07 MED ORDER — ATORVASTATIN CALCIUM 40 MG PO TABS
40.0000 mg | ORAL_TABLET | Freq: Every day | ORAL | Status: DC
  Administered 2014-04-07 – 2014-04-30 (×20): 40 mg via ORAL
  Filled 2014-04-07 (×25): qty 1

## 2014-04-07 MED ORDER — RESOURCE THICKENUP CLEAR PO POWD: 125.0000 | ORAL | Status: DC | PRN

## 2014-04-07 MED ORDER — INSULIN ASPART 100 UNIT/ML ~~LOC~~ SOLN: 0.0000 [IU] | SUBCUTANEOUS | Status: DC

## 2014-04-07 MED ORDER — METOPROLOL TARTRATE 12.5 MG HALF TABLET
12.5000 mg | ORAL_TABLET | Freq: Two times a day (BID) | ORAL | Status: DC
  Administered 2014-04-07 – 2014-04-10 (×6): 12.5 mg via ORAL
  Filled 2014-04-07 (×8): qty 1

## 2014-04-07 NOTE — Consult Note (Signed)
Physical Medicine and Rehabilitation Consult Reason for Consult: CVA Referring Physician: [facility]   HPI: Rebecca [name] is a 69 y.o. right handed female  with history of Fabrys disease, hypertension, atrial fibrillation maintained on Xarelto question compliance, tobacco abuse, diabetes mellitus and peripheral neuropathy past CVA January 2015 and residual expressive aphasia. At baseline patient used a cane. 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 Bifrontal encephalomalacia further propagation on the right new on the left consistent with interval bilateral posterior cerebral artery territory infarct. MRA with decreased flow in left MCA. Carotid Dopplers with no ICA stenosis. Echocardiogram with ejection fraction of 50% no wall motion abnormalities. EEG negative for seizure. Patient did not receive TPA. Neurology consulted placed on Eliquis as there was some question to her medical compliance with Xarelto prior to admission as well as low-dose aspirin. Dysphagia one nectar thick liquid diet.   Review of Systems  Unable to perform ROS: language   Past Medical History  Diagnosis Date  . Hypertension   . 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 Expressive aphasia 03/23/2013  . Atrial fibrillation 05/01/2013  . Tobacco abuse 05/01/2013  . Diabetes mellitus without complication   . Depression   . Stroke     2015   Past Surgical History  Procedure Laterality Date  . Tee without cardioversion N/A 03/26/2013    Procedure: TRANSESOPHAGEAL ECHOCARDIOGRAM (TEE);  Surgeon: Thurmon Fair, MD;  Location: [facility] ENDOSCOPY;  Service: Cardiovascular;  Laterality: N/A;  . Cesarean section      x3  . Hip replacement Right 1990's  . Hip replacemet Left 1990's   Family History  Problem Relation Age of Onset  . Alcohol abuse Maternal  Aunt    Social History:  reports that she has never smoked. She does not have any smokeless tobacco history on file. She reports that she does not drink alcohol or use illicit drugs. Allergies: No Known Allergies Medications Prior to Admission  Medication Sig Dispense Refill  . atorvastatin (LIPITOR) 40 MG tablet Take 1 tablet (40 mg total) by mouth daily at 6 PM. 30 tablet 0  . lisinopril (PRINIVIL,ZESTRIL) 5 MG tablet Take 1 tablet (5 mg total) by mouth daily. 30 tablet 0  . metoprolol succinate (TOPROL-XL) 25 MG 24 hr tablet TAKE 1 TABLET THREE TIMES A WEEK (Patient taking differently: Take 25 mg by mouth 3 (three) times a week. ) 39 tablet 6  . acetaminophen (TYLENOL) 500 MG tablet Take 500 mg by mouth every 6 (six) hours as needed for moderate pain.    [name] agalsidase beta 1 mg/kg in sodium chloride 0.9 % Inject 1 mg/kg into the vein every 14 (fourteen) days.    [name] aspirin EC 81 MG tablet Take 81 mg by mouth daily.    . brimonidine (ALPHAGAN P) 0.1 % SOLN Place 1 drop into both eyes 2 (two) times daily.    . carboxymethylcellulose 1 % ophthalmic solution Apply 1 drop to eye as needed (for dry eyes).    [name] escitalopram (LEXAPRO) 5 MG tablet Take 1 tablet (5 mg total) by mouth daily. 30 tablet 1  . fexofenadine (ALLEGRA) 180 MG tablet Take 180 mg by mouth daily.    . hydrochlorothiazide (HYDRODIURIL) 25 MG tablet Take 1 tablet (25 mg total) by mouth daily. 30 tablet 0  . Ibuprofen-Diphenhydramine Cit 200-38 MG TABS  Take 1 tablet by mouth at bedtime as needed (for sleep).    . Multiple Vitamin (MULTIVITAMIN WITH MINERALS) TABS tablet Take 1 tablet by mouth daily.    . rivaroxaban (XARELTO) 20 MG TABS tablet Take 1 tablet (20 mg total) by mouth daily. 90 tablet 0    Home: Home Living Family/patient expects to be discharged to:: Unsure Living Arrangements: Spouse/significant other Additional Comments: Pt with significant aphasia and unable to report hx.  No family present to report.     Functional History: Prior Function Comments: Patient with significant aphasia and unable to report PLOF. No family present. Functional Status:  Mobility: Bed Mobility Overal bed mobility: Needs Assistance Bed Mobility: Supine to Sit Supine to sit: Mod assist General bed mobility comments: Observed with nursing to come to EOB,  Transfers Overall transfer level: Needs assistance Equipment used: Rolling walker (2 wheeled) Transfers: Sit to/from Stand Sit to Stand: Min guard Stand pivot transfers: Min assist General transfer comment: min guard for stability, patient was able to perform x3 with no physical assist on the 2nd and 3rd transfer Ambulation/Gait Ambulation/Gait assistance: Min assist, Mod assist Ambulation Distance (Feet): 90 Feet Assistive device: Rolling walker (2 wheeled) Gait Pattern/deviations: Step-through pattern, Scissoring, Staggering left, Staggering right Gait velocity: decreased Gait velocity interpretation: Below normal speed for age/gender General Gait Details: patient with difficulty controlling RW initially, continuously off to the right during mobility with manual assist to bring back in positionin.  Patient with occassional occurance of stepping on her own feet during scissoring, attempted cues- unsuccessful response.      ADL: ADL Overall ADL's : Needs assistance/impaired Eating/Feeding: NPO Grooming: Wash/dry hands, Wash/dry face, Oral care, Moderate assistance Grooming Details (indicate cue type and reason): hand over hand to initiate washing hands with cloth, then patient perseverating on task. Patient unable to follow verbal/visual/tactile cues to wash face. Mod A to swab mouth with toothette after verbal and hand over hand initiation of task. Upper Body Bathing: Total assistance Upper Body Bathing Details (indicate cue type and reason): Attempted to use verbal/visual/tactile cues and hand over hand to initiate this task but patient unable to  follow. Lower Body Bathing: Total assistance Functional mobility during ADLs:  (mod A supine to long sit in the bed) General ADL Comments: Patient limited by global aphasia, decreased motor planning, apraxia, difficulty processing information and following commands. She does move her arms spontaneously in bed and was able to wash hands once given verbal/visual/tactile cues but then became perseverative. Unable to wash face or perform UB bathing. Swabbed mouth with toothette and verbal/visual/tactile cues with initial hand over hand and mod A for thoroughness.  Cognition: Cognition Overall Cognitive Status: Difficult to assess Arousal/Alertness: Awake/alert Orientation Level: Other (comment) (UTA; nonverbal) Attention: Sustained Sustained Attention: Impaired Sustained Attention Impairment: Verbal basic, Functional basic Memory:  (TBA) Awareness: Impaired Awareness Impairment: Intellectual impairment, Emergent impairment, Anticipatory impairment Problem Solving: Appears intact Behaviors: Restless Safety/Judgment: Impaired Cognition Arousal/Alertness: Awake/alert Behavior During Therapy: Restless Overall Cognitive Status: Difficult to assess Area of Impairment: Following commands, Safety/judgement, Problem solving Following Commands: Follows one step commands consistently, Follows one step commands with increased time, Follows multi-step commands inconsistently Problem Solving: Slow processing, Difficulty sequencing, Requires verbal cues, Requires tactile cues General Comments: patient responding to commands with verbal and tactile cues Difficult to assess due to: Impaired communication  Blood pressure 138/85, pulse 56, temperature 98.3 F (36.8 C), temperature source Oral, resp. rate 16, height  (1.626 m), weight 58.605 kg (129 lb 3.2 oz),  SpO2 100 %. Physical Exam  Constitutional:  69 year old African-American female  Eyes:  Pupils round and reactive to light  Neck: Normal  range of motion. Neck supple. No thyromegaly present.  Cardiovascular:  Cardiac rate controlled  Respiratory: Effort normal and breath sounds normal. No respiratory distress.  GI: Soft. Bowel sounds are normal. She exhibits no distension.  Neurological: She is alert.  Patient globally aphasic. She did not follow simple one-step demonstrated commands. Ate her ice cream independently. Slow to initiate tasks. Mild right facial doop. Right > Left weakness but moves all 4's (very inconsistent due to language deficits.) very alert   Skin: Skin is warm and dry.  Psychiatric:  flat    Results for orders placed or performed during the hospital encounter of 04/01/14 (from the past 24 hour(s))  Glucose, capillary     Status: Abnormal   Collection Time: 04/06/14  8:31 AM  Result Value Ref Range   Glucose-Capillary 122 (H) 70 - 99 mg/dL  Glucose, capillary     Status: Abnormal   Collection Time: 04/06/14 11:58 AM  Result Value Ref Range   Glucose-Capillary 145 (H) 70 - 99 mg/dL  Glucose, capillary     Status: Abnormal   Collection Time: 04/06/14  4:17 PM  Result Value Ref Range   Glucose-Capillary 123 (H) 70 - 99 mg/dL  Glucose, capillary     Status: None   Collection Time: 04/06/14  8:39 PM  Result Value Ref Range   Glucose-Capillary 93 70 - 99 mg/dL  Glucose, capillary     Status: None   Collection Time: 04/06/14 11:56 PM  Result Value Ref Range   Glucose-Capillary 88 70 - 99 mg/dL  Comprehensive metabolic panel     Status: Abnormal   Collection Time: 04/07/14  3:06 AM  Result Value Ref Range   Sodium 144 135 - 145 mmol/L   Potassium 3.9 3.5 - 5.1 mmol/L   Chloride 105 96 - 112 mEq/L   CO2 31 19 - 32 mmol/L   Glucose, Bld 96 70 - 99 mg/dL   BUN 20 6 - 23 mg/dL   Creatinine, Ser 3.42 0.50 - 1.10 mg/dL   Calcium 8.9 8.4 - 87.6 mg/dL   Total Protein 6.4 6.0 - 8.3 g/dL   Albumin 3.0 (L) 3.5 - 5.2 g/dL   AST 28 0 - 37 U/L   ALT 15 0 - 35 U/L   Alkaline Phosphatase 34 (L) 39 - 117 U/L    Total Bilirubin 0.7 0.3 - 1.2 mg/dL   GFR calc non Af Amer 64 (L) >90 mL/min   GFR calc Af Amer 74 (L) >90 mL/min   Anion gap 8 5 - 15   Dg Abd Portable 1v  04/05/2014   CLINICAL DATA:  NG tube placement  EXAM: PORTABLE ABDOMEN - 1 VIEW  COMPARISON:  04/04/2014  FINDINGS: Feeding tube tip is in the mid portion of the stomach, in similar position to prior study. Nonobstructive bowel gas pattern.  IMPRESSION: Feeding tube tip in the mid portion of the stomach.   Electronically Signed   By: Charlett Nose M.D.   On: 04/05/2014 18:37   Dg Swallowing Func-speech Pathology  04/06/2014   Riley Nearing [place]bl[place], CCC-SLP     04/06/2014  3:03 PM  Objective Swallowing Evaluation: Modified Barium Swallowing Study   Patient Details  Name: LONI KOLIN MRN: [id number] Date of Birth: 03-Jul-1945  Today's Date: 04/06/2014 Time: SLP Start Time (ACUTE ONLY): 1330-SLP Stop Time (ACUTE  ONLY):  1400 SLP Time Calculation (min) (ACUTE ONLY): 30 min  Past Medical History:  Past Medical History  Diagnosis Date  . Hypertension   . 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 Expressive aphasia 03/23/2013  . Atrial fibrillation 05/01/2013  . Tobacco abuse 05/01/2013  . Diabetes mellitus without complication   . Depression   . Stroke     2015   Past Surgical History:  Past Surgical History  Procedure Laterality Date  . Tee without cardioversion N/A 03/26/2013    Procedure: TRANSESOPHAGEAL ECHOCARDIOGRAM (TEE);  Surgeon:  Thurmon Fair, MD;  Location: [facility] ENDOSCOPY;  Service:  Cardiovascular;  Laterality: N/A;  . Cesarean section      x3  . Hip replacement Right 1990's  . Hip replacemet Left 1990's   HPI:  HPI: 69 y.o. female with a past medical history of hypertension,  atrial fibrillation, Fabry disease (Fabry disease causes a  buildup of fatty material in the autonomic nervous system, eyes,  kidneys, and cardiovascular system) , hx of tobacco abuse,  diabetes mellitus, stroke, depression, who presents with aphasia.  MRI Acute on chronic LEFT frontal  lobe infarct (middle cerebral  artery territory. Bilateral basal ganglia and thalamus lacunar  infarcts. Recently hospitalized with stroke with expressive  aphasia and mild weakness on the right side. Failed RN stroke  swallow scren.  No Data Recorded  Assessment / Plan / Recommendation CHL IP CLINICAL IMPRESSIONS 04/06/2014  Dysphagia Diagnosis Moderate pharyngeal phase dysphagia;Moderate  oral phase dysphagia  Clinical impression Pt demonstrates a primary oral phase  dysphagia with reduced ability to initiate oral movements for PO  intake. Pt requires moderate assist for cup to mouth and  appropriate head posture (tilts head back). There is right sided  oral weakness with moderate residuals remaining post swallow.  Moderate sensorimotor oropharyngeal impairment is characterized  by significantly delayed swallow response and pt silently  aspirates thin liquids before the swallow (trace). Suspect larger  amount of aspirate would elicit strong cough as seen at bedside.  Pt tolerates nectar thick liquids and purees without penetration  or aspiration though bolus size does need to be supervised to  decrease risk. Pt should also be cued to attempt transit of oral  residuals with second swallow, or suction to right side should be  applied intermittently during meals. Recommend initiating a Dys 1  (puree) diet with nectar thick liquids with full supervision.       CHL IP TREATMENT RECOMMENDATION 04/06/2014  Treatment Plan Recommendations Therapy as outlined in treatment  plan below     CHL IP DIET RECOMMENDATION 04/06/2014  Diet Recommendations Dysphagia 1 (Puree);Nectar-thick liquid  Liquid Administration via Cup Medication Administration Whole  meds with puree  Compensations Slow rate;Small sips/bites;Check for  pocketing;Check for anterior loss;Multiple dry swallows after  each bite/sip  Postural Changes and/or Swallow Maneuvers Seated upright 90  degrees;Out of bed for meals     CHL IP OTHER RECOMMENDATIONS 04/06/2014   Recommended Consults (None)  Oral Care Recommendations Oral care BID  Other Recommendations Order thickener from pharmacy;Have oral  suction available     CHL IP FOLLOW UP RECOMMENDATIONS 04/06/2014  Follow up Recommendations Inpatient Rehab     CHL IP FREQUENCY AND DURATION 04/06/2014  Speech Therapy Frequency (ACUTE ONLY) min 2x/week  Treatment Duration 2 weeks     Pertinent Vitals/Pain NA    SLP Swallow Goals No flowsheet data found.  No flowsheet data found.    CHL IP REASON FOR REFERRAL 04/06/2014  Reason for Referral Objectively evaluate  swallowing function     CHL IP ORAL PHASE 04/06/2014  Lips (None)  Tongue (None)  Mucous membranes (None)  Nutritional status (None)  Other (None)  Oxygen therapy (None)  Oral Phase Impaired  Oral - Pudding Teaspoon (None)  Oral - Pudding Cup (None)  Oral - Honey Teaspoon (None)  Oral - Honey Cup (None)  Oral - Honey Syringe (None)  Oral - Nectar Teaspoon (None)  Oral - Nectar Cup Right anterior bolus loss;Weak lingual  manipulation;Lingual pumping;Reduced posterior propulsion;Right  pocketing in lateral sulci;Lingual/palatal residue;Delayed oral  transit  Oral - Nectar Straw Other (Comment)  Oral - Nectar Syringe (None)  Oral - Ice Chips (None)  Oral - Thin Teaspoon (None)  Oral - Thin Cup Right anterior bolus loss;Weak lingual  manipulation;Lingual pumping;Reduced posterior propulsion;Right  pocketing in lateral sulci;Lingual/palatal residue;Delayed oral  transit  Oral - Thin Straw (None)  Oral - Thin Syringe (None)  Oral - Puree Right anterior bolus loss;Weak lingual  manipulation;Lingual pumping;Reduced posterior propulsion;Right  pocketing in lateral sulci;Delayed oral transit  Oral - Mechanical Soft (None)  Oral - Regular Right anterior bolus loss;Weak lingual  manipulation;Lingual pumping;Reduced posterior propulsion;Right  pocketing in lateral sulci;Lingual/palatal residue;Delayed oral  transit;Impaired mastication  Oral - Multi-consistency (None)  Oral - Pill Weak lingual  manipulation;Lingual pumping;Reduced  posterior propulsion;Right pocketing in lateral sulci;Delayed  oral transit  Oral Phase - Comment (None)      CHL IP PHARYNGEAL PHASE 04/06/2014  Pharyngeal Phase Impaired  Pharyngeal - Pudding Teaspoon (None)  Penetration/Aspiration details (pudding teaspoon) (None)  Pharyngeal - Pudding Cup (None)  Penetration/Aspiration details (pudding cup) (None)  Pharyngeal - Honey Teaspoon (None)  Penetration/Aspiration details (honey teaspoon) (None)  Pharyngeal - Honey Cup (None)  Penetration/Aspiration details (honey cup) (None)  Pharyngeal - Honey Syringe (None)  Penetration/Aspiration details (honey syringe) (None)  Pharyngeal - Nectar Teaspoon (None)  Penetration/Aspiration details (nectar teaspoon) (None)  Pharyngeal - Nectar Cup Delayed swallow initiation;Pharyngeal  residue - valleculae  Penetration/Aspiration details (nectar cup) Material does not  enter airway  Pharyngeal - Nectar Straw (None)  Penetration/Aspiration details (nectar straw) (None)  Pharyngeal - Nectar Syringe (None)  Penetration/Aspiration details (nectar syringe) (None)  Pharyngeal - Ice Chips (None)  Penetration/Aspiration details (ice chips) (None)  Pharyngeal - Thin Teaspoon (None)  Penetration/Aspiration details (thin teaspoon) (None)  Pharyngeal - Thin Cup Delayed swallow  initiation;Penetration/Aspiration before swallow;Trace  aspiration;Pharyngeal residue - valleculae  Penetration/Aspiration details (thin cup) Material enters airway,  passes BELOW cords without attempt by patient to eject out  (silent aspiration)  Pharyngeal - Thin Straw (None)  Penetration/Aspiration details (thin straw) (None)  Pharyngeal - Thin Syringe (None)  Penetration/Aspiration details (thin syringe') (None)  Pharyngeal - Puree Delayed swallow initiation  Penetration/Aspiration details (puree) (None)  Pharyngeal - Mechanical Soft (None)  Penetration/Aspiration details (mechanical soft) (None)  Pharyngeal - Regular Delayed swallow  initiation  Penetration/Aspiration details (regular) (None)  Pharyngeal - Multi-consistency (None)  Penetration/Aspiration details (multi-consistency) (None)  Pharyngeal - Pill Delayed swallow initiation  Penetration/Aspiration details (pill) (None)  Pharyngeal Comment (None)     No flowsheet data found.  No flowsheet data found.        [name], [place] CCC-SLP [phone]  [name] 04/06/2014, 3:01 PM     Assessment/Plan: Diagnosis: left frontal infarct 1. Does the need for close, 24 hr/day medical supervision in concert with the patient's rehab needs make it unreasonable for this patient to be served in a less intensive setting? Yes 2. Co-Morbidities requiring supervision/potential complications: afib, dm,  htn 3. Due to bladder management, bowel management, safety, skin/wound care, disease management, medication administration, pain management and patient education, does the patient require 24 hr/day rehab nursing? Yes 4. Does the patient require coordinated care of a physician, rehab nurse, PT (1-2 hrs/day, 5 days/week), OT (1-2 hrs/day, 5 days/week) and SLP (1-2 hrs/day, 5 days/week) to address physical and functional deficits in the context of the above medical diagnosis(es)? Yes Addressing deficits in the following areas: balance, endurance, locomotion, strength, transferring, bowel/bladder control, bathing, dressing, feeding, grooming, toileting, cognition, speech, language, swallowing and psychosocial support 5. Can the patient actively participate in an intensive therapy program of at least 3 hrs of therapy per day at least 5 days per week? Yes 6. The potential for patient to make measurable gains while on inpatient rehab is good 7. Anticipated functional outcomes upon discharge from inpatient rehab are supervision  with PT, supervision with OT, mod assist with SLP. 8. Estimated rehab length of stay to reach the above functional goals is: 11-15 days 9. Does the patient have  adequate social supports and living environment to accommodate these discharge functional goals? Yes 10. Anticipated D/C setting: Home 11. Anticipated post D/C treatments: HH therapy and Outpatient therapy 12. Overall Rehab/Functional Prognosis: excellent  RECOMMENDATIONS: This patient's condition is appropriate for continued rehabilitative care in the following setting: CIR Patient has agreed to participate in recommended program. Yes Note that insurance prior authorization may be required for reimbursement for recommended care.  Comment: Rehab Admissions Coordinator to follow up.  Thanks,  Ranelle Oyster, MD, [place] [facility]     04/07/2014

## 2014-04-07 NOTE — Plan of Care (Signed)
Problem: Progression Outcomes Goal: Pain controlled Outcome: Completed/Met Date Met:  04/07/14 Patient has no complaints of pain and continues to have CPOT of 0.

## 2014-04-07 NOTE — PMR Pre-admission (Signed)
PMR Admission Coordinator Pre-Admission Assessment  Patient: Rebecca [name] is an 69 y.o., female MRN: [id number] DOB: 09/09/1945 Height: 5\' 4"  (162.6 cm) Weight: 58.605 kg (129 lb 3.2 oz)              Insurance Information  PRIMARY: Medicare A & B      Policy#: [id number]      Subscriber: self Pre-Cert#: verified in [employer]: retired Financial risk analyst. Date: A & B: 08-18-09     Deduct: $1288      Out of Pocket Max: none      Life Max: unlimited CIR: 100%      SNF: 100% days 1-20; 80% days 21-100 (100 days max) Outpatient: 80%     Co-Pay: 20% Home Health: 100%      Co-Pay: none DME: 80%     Co-Pay: 20% Providers: pt's preference  SECONDARY: Generic [employer]      Policy#: [id number]      Subscriber: self Benefits:  Phone #: [phone]       Emergency Contact Information Contact Information    Name Relation Home Work [place] Son [phone]       Current Medical History  Patient Admitting Diagnosis: left frontal infarct  History of Present Illness: Rebecca [name] is a 69 y.o. 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 At baseline patient used a cane. 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 Bifrontal encephalomalacia further propagation on the right new on the left consistent with interval bilateral posterior cerebral artery territory infarct. MRA with decreased flow in left MCA. Carotid Dopplers with no ICA stenosis. Echocardiogram with ejection fraction of 50% no wall motion abnormalities. EEG negative for seizure. Patient did not receive TPA. Neurology consulted placed on Eliquis as  there was some question to her medical compliance with Xarelto prior to admission as well as low-dose aspirin. Dysphagia one nectar thick liquid diet.  NIH Total: 6  Past Medical History  Past Medical History  Diagnosis Date  . Hypertension   . 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 Expressive aphasia 03/23/2013  . Atrial fibrillation 05/01/2013  . Tobacco abuse 05/01/2013  . Diabetes mellitus without complication   . Depression   . Stroke     2015    Family History  family history includes Alcohol abuse in her maternal aunt.  Prior Rehab/Hospitalizations: pt had a previous CVA in January 2015 and had follow up SLP.   Current Medications   Current facility-administered medications:  .  0.9 %  sodium chloride infusion, , Intravenous, Continuous, Leone Brand, NP, Last Rate: 10 mL/hr at 04/04/14 1419, 10 mL/hr at 04/04/14 1419 .  acetaminophen (TYLENOL) suppository 650 mg, 650 mg, Rectal, Q6H PRN, Drema Dallas, MD, 650 mg at 04/05/14 0016 .  acetaminophen (TYLENOL) tablet 650 mg, 650 mg, Oral, Q6H PRN, Lonia Blood, MD .  antiseptic oral rinse (CPC / CETYLPYRIDINIUM CHLORIDE 0.05%) solution 7 mL, 7 mL, Mouth Rinse, q12n4p, Lorretta Harp, MD, 7 mL at 04/06/14 1600 .  apixaban (ELIQUIS) tablet 5 mg, 5 mg, Oral, BID, Lonia Blood, MD, 5 mg at 04/07/14 1049 .  aspirin chewable tablet 81 mg, 81 mg, Oral,  Daily, Lonia Blood, MD, 81 mg at 04/07/14 1049 .  atorvastatin (LIPITOR) tablet 40 mg, 40 mg, Oral, q1800, Lorretta Harp, MD, 40 mg at 04/06/14 1712 .  chlorhexidine (PERIDEX) 0.12 % solution 15 mL, 15 mL, Mouth Rinse, BID, Lorretta Harp, MD, 15 mL at 04/07/14 0852 .  escitalopram (LEXAPRO) tablet 5 mg, 5 mg, Oral, Daily, Lorretta Harp, MD, 5 mg at 04/06/14 0906 .  feeding supplement (JEVITY 1.2 CAL) liquid 1,000 mL, 1,000 mL, Per Tube, Continuous, Heather Cornelison Pitts, RD, Last Rate: 60 mL/hr at 04/06/14 0955, 1,000 mL at 04/06/14 0955 .  insulin aspart (novoLOG) injection 0-9 Units, 0-9 Units,  Subcutaneous, 6 times per day, Drema Dallas, MD, 1 Units at 04/06/14 1711 .  loratadine (CLARITIN) tablet 10 mg, 10 mg, Oral, Daily, Lorretta Harp, MD, 10 mg at 04/07/14 1049 .  metoprolol tartrate (LOPRESSOR) tablet 12.5 mg, 12.5 mg, Oral, BID, Lonia Blood, MD, 12.5 mg at 04/07/14 1049 .  multivitamin with minerals tablet 1 tablet, 1 tablet, Oral, Daily, Lorretta Harp, MD, 1 tablet at 04/07/14 1048 .  polyvinyl alcohol (LIQUIFILM TEARS) 1.4 % ophthalmic solution 1 drop, 1 drop, Both Eyes, PRN, Lorretta Harp, MD .  RESOURCE THICKENUP CLEAR, , Oral, PRN, Lonia Blood, MD .  senna-docusate (Senokot-S) tablet 1 tablet, 1 tablet, Oral, QHS PRN, Lorretta Harp, MD  Patients Current Diet: DIET - DYS 1, nectar thick, full supervision, small sips, check for pocketing, pills whole in puree  Precautions / Restrictions Precautions Precautions: Fall Precaution Comments: pt is unstready on her feet.  Restrictions Weight Bearing Restrictions: No   Prior Activity Level Limited Community (1-2x/wk): Pt was independent prior to CVA and did get out and drive on errands about 3 x/week. Son states that pt prefers to be solitary and used to enjoy gardening and being outside.   Home Assistive Devices / Equipment  pt has a shower bench and cane per son.  Prior Functional Level Prior Function Comments: Patient with significant aphasia and unable to report PLOF. Pt's son [name] gave baseline information.   Current Functional Level Cognition  Arousal/Alertness: Awake/alert Overall Cognitive Status: Difficult to assess Difficult to assess due to: Impaired communication Orientation Level: Oriented to person, Other (comment) (nonverbal) Following Commands: Follows one step commands consistently, Follows one step commands with increased time, Follows multi-step commands inconsistently General Comments: patient responding to commands with verbal and tactile cues Attention: Sustained Sustained Attention:  Impaired Sustained Attention Impairment: Verbal basic, Functional basic Memory:  (TBA) Awareness: Impaired Awareness Impairment: Intellectual impairment, Emergent impairment, Anticipatory impairment Problem Solving: Appears intact Behaviors: Restless Safety/Judgment: Impaired    Extremity Assessment (includes Sensation/Coordination)  Upper Extremity Assessment: Difficult to assess due to impaired cognition (PROM WFL, does move arms against gravity)  Lower Extremity Assessment: Defer to PT evaluation    ADLs  Overall ADL's : Needs assistance/impaired Eating/Feeding: NPO Grooming: Wash/dry hands, Wash/dry face, Oral care, Moderate assistance Grooming Details (indicate cue type and reason): hand over hand to initiate washing hands with cloth, then patient perseverating on task. Patient unable to follow verbal/visual/tactile cues to wash face. Mod A to swab mouth with toothette after verbal and hand over hand initiation of task. Upper Body Bathing: Total assistance Upper Body Bathing Details (indicate cue type and reason): Attempted to use verbal/visual/tactile cues and hand over hand to initiate this task but patient unable to follow. Lower Body Bathing: Total assistance Functional mobility during ADLs:  (mod A supine to long sit in the bed) General  ADL Comments: Patient limited by global aphasia, decreased motor planning, apraxia, difficulty processing information and following commands. She does move her arms spontaneously in bed and was able to wash hands once given verbal/visual/tactile cues but then became perseverative. Unable to wash face or perform UB bathing. Swabbed mouth with toothette and verbal/visual/tactile cues with initial hand over hand and mod A for thoroughness.    Mobility  Overal bed mobility: Needs Assistance Bed Mobility: Supine to Sit Supine to sit: Mod assist General bed mobility comments: Observed with nursing to come to EOB,     Transfers  Overall transfer  level: Needs assistance Equipment used: Rolling walker (2 wheeled) Transfers: Sit to/from Stand Sit to Stand: Min guard Stand pivot transfers: Min assist General transfer comment: min guard for stability, patient was able to perform x3 with no physical assist on the 2nd and 3rd transfer    Ambulation / Gait / Stairs / Wheelchair Mobility  Ambulation/Gait Ambulation/Gait assistance: Min assist, Mod assist Ambulation Distance (Feet): 90 Feet Assistive device: Rolling walker (2 wheeled) Gait Pattern/deviations: Step-through pattern, Scissoring, Staggering left, Staggering right Gait velocity: decreased Gait velocity interpretation: Below normal speed for age/gender General Gait Details: patient with difficulty controlling RW initially, continuously off to the right during mobility with manual assist to bring back in positionin.  Patient with occassional occurance of stepping on her own feet during scissoring, attempted cues- unsuccessful response.      Posture / Balance  see PT note, needing min to mod assist with dynamic standing activities    Special needs/care consideration BiPAP/CPAP no CPM no  Continuous Drip IV no Dialysis no         Life Vest no  Oxygen no  Special Bed no  Trach Size no  Wound Vac (area) no       Skin - no current issues                               Bowel mgmt: last BM on 04-01-14 Bladder mgmt: incontinence noted per RN Diabetic mgmt -managed at home with meds    Previous Home Environment Living Arrangements: Spouse/significant other Additional Comments: Pt with significant aphasia and unable to report hx.     Discharge Living Setting Plans for Discharge Living Setting: Other (Comment) (Possible plan to go live with pt's son [name], see comments below) Type of Home at Discharge: House Discharge Home Layout: Two level, One level Discharge Home Access: Stairs to enter Does the patient have any problems obtaining your medications?:  No  Social/Family/Support Systems Patient Roles: Caregiver (pt cares for adult disabled son who is on HD. This son does not need physical assist and will be moving out to his own apartment soon per [name].) Contact Information: Son Pamie [place]  Anticipated Caregiver: son [name] (other son who takes HD will be moving into his own apartment soon) Anticipated Caregiver's Contact Information: see above Ability/Limitations of Caregiver: Son [name] has some concerns over helping his mom with self care skills and if she will be incontinent or not. He is willing to try to help her and hopes she will be as independent as possible. [name] also stated they are working towards putting pt's house on the market. Caregiver Availability: 24/7 Discharge Plan Discussed with Primary Caregiver: Yes (discussed ) Is Caregiver In Agreement with Plan?: Yes Does Caregiver/Family have Issues with Lodging/Transportation while Pt is in Rehab?: No  Goals/Additional Needs Patient/Family Goal for Rehab: Supervision  with PT/OT and mod assist with SLP Expected length of stay: 11-15 days Cultural Considerations: none Dietary Needs: Dys 1, nectar thick, full supervision, small sips, check for pocketing, pills whole in puree Equipment Needs: to be determined Pt/Family Agrees to Admission and willing to participate: Yes (spoke with pt's son [name] by phone on 04-07-14) Program Orientation Provided & Reviewed with Pt/Caregiver Including Roles  & Responsibilities: Yes   Decrease burden of Care through IP rehab admission: NA   Possible need for SNF placement upon discharge: Possible, pt's son [name] shared that he is willing to have pt move in with him at the end of rehab but he has concerns about pt's self care tasks (like bathing/being incontinent). He is willing to have her home with him if he is comfortable assisting her.  Patient Condition: This patient's condition remains as documented in the consult dated 04-07-14, in which  the Rehabilitation Physician determined and documented that the patient's condition is appropriate for intensive rehabilitative care in an inpatient rehabilitation facility. Will admit to inpatient rehab today.  Preadmission Screen Completed By:  Juliann Mule, PT, 04/07/2014 12:09 PM ______________________________________________________________________   Discussed status with Dr. Riley Kill on 04-07-14 at 1204 and received telephone approval for admission today.  Admission Coordinator:  Juliann Mule, PT, time1204/Date 04-07-14

## 2014-04-07 NOTE — H&P (Signed)
Physical Medicine and Rehabilitation Admission H&P   Chief Complaint  Patient presents with  . Shortness of Breath  . Weakness  : HPI: Rebecca [name] is a 69 y.o. right handed female with history of Fabrys disease, diastolic congestive heart failure, hypertension, atrial fibrillation maintained on Xarelto and low-dose aspirin with question of medical compliance, tobacco abuse, diabetes mellitus and peripheral neuropathy past CVA January 2015 and residual expressive aphasia. Independent prior to admission living with her son. 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 Bifrontal encephalomalacia further propagation on the right new on the left consistent with interval bilateral posterior cerebral artery territory infarct. MRA with decreased flow in left MCA. Carotid Dopplers with no ICA stenosis. Echocardiogram with ejection fraction of 50% no wall motion abnormalities. EEG negative for seizure. Patient did not receive TPA. Neurology consulted placed on Eliquis alone as there was some question to her medical compliance with Xarelto prior to admission. Dysphagia one nectar thick liquid diet. Physical therapy and occupational evaluations completed with recommendations of physical medicine rehabilitation consult. Patient was admitted for comprehensive rehabilitation program  ROS Review of Systems  Unable to perform ROS: language     Past Medical History  Diagnosis Date  . Hypertension   . 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 Expressive aphasia 03/23/2013  . Atrial fibrillation 05/01/2013  . Tobacco abuse 05/01/2013  . Diabetes mellitus without complication   . Depression   . Stroke     2015   Past Surgical History  Procedure Laterality Date  . Tee without cardioversion  N/A 03/26/2013    Procedure: TRANSESOPHAGEAL ECHOCARDIOGRAM (TEE); Surgeon: Sanda Klein, MD; Location: [facility] ENDOSCOPY; Service: Cardiovascular; Laterality: N/A;  . Cesarean section      x3  . Hip replacement Right 1990's  . Hip replacemet Left 1990's   Family History  Problem Relation Age of Onset  . Alcohol abuse Maternal Aunt    Social History:  reports that she has never smoked. She does not have any smokeless tobacco history on file. She reports that she does not drink alcohol or use illicit drugs. Allergies: No Known Allergies Medications Prior to Admission  Medication Sig Dispense Refill  . atorvastatin (LIPITOR) 40 MG tablet Take 1 tablet (40 mg total) by mouth daily at 6 PM. 30 tablet 0  . lisinopril (PRINIVIL,ZESTRIL) 5 MG tablet Take 1 tablet (5 mg total) by mouth daily. 30 tablet 0  . metoprolol succinate (TOPROL-XL) 25 MG 24 hr tablet TAKE 1 TABLET THREE TIMES A WEEK (Patient taking differently: Take 25 mg by mouth 3 (three) times a week. ) 39 tablet 6  . acetaminophen (TYLENOL) 500 MG tablet Take 500 mg by mouth every 6 (six) hours as needed for moderate pain.    [name] agalsidase beta 1 mg/kg in sodium chloride 0.9 % Inject 1 mg/kg into the vein every 14 (fourteen) days.    [name] aspirin EC 81 MG tablet Take 81 mg by mouth daily.    . brimonidine (ALPHAGAN P) 0.1 % SOLN Place 1 drop into both eyes 2 (two) times daily.    . carboxymethylcellulose 1 % ophthalmic solution Apply 1 drop to eye as needed (for dry eyes).    [name] escitalopram (LEXAPRO) 5 MG tablet Take 1 tablet (5 mg total) by mouth daily. 30 tablet 1  . fexofenadine (ALLEGRA) 180 MG tablet Take  180 mg by mouth daily.    . hydrochlorothiazide (HYDRODIURIL) 25 MG tablet Take 1 tablet (25 mg total) by mouth daily. 30 tablet 0  . Ibuprofen-Diphenhydramine Cit 200-38 MG TABS Take 1 tablet by mouth at bedtime as needed (for sleep).    .  Multiple Vitamin (MULTIVITAMIN WITH MINERALS) TABS tablet Take 1 tablet by mouth daily.    . rivaroxaban (XARELTO) 20 MG TABS tablet Take 1 tablet (20 mg total) by mouth daily. 90 tablet 0    Home: Home Living Family/patient expects to be discharged to:: Unsure Living Arrangements: Spouse/significant other Additional Comments: Pt with significant aphasia and unable to report hx. No family present to report.   Functional History: Prior Function Comments: Patient with significant aphasia and unable to report PLOF. No family present.  Functional Status:  Mobility: Bed Mobility Overal bed mobility: Needs Assistance Bed Mobility: Supine to Sit Supine to sit: Mod assist General bed mobility comments: Observed with nursing to come to EOB,  Transfers Overall transfer level: Needs assistance Equipment used: Rolling walker (2 wheeled) Transfers: Sit to/from Stand Sit to Stand: Min guard Stand pivot transfers: Min assist General transfer comment: min guard for stability, patient was able to perform x3 with no physical assist on the 2nd and 3rd transfer Ambulation/Gait Ambulation/Gait assistance: Min assist, Mod assist Ambulation Distance (Feet): 90 Feet Assistive device: Rolling walker (2 wheeled) Gait Pattern/deviations: Step-through pattern, Scissoring, Staggering left, Staggering right Gait velocity: decreased Gait velocity interpretation: Below normal speed for age/gender General Gait Details: patient with difficulty controlling RW initially, continuously off to the right during mobility with manual assist to bring back in positionin. Patient with occassional occurance of stepping on her own feet during scissoring, attempted cues- unsuccessful response.     ADL: ADL Overall ADL's : Needs assistance/impaired Eating/Feeding: NPO Grooming: Wash/dry hands, Wash/dry face, Oral care, Moderate assistance Grooming Details (indicate cue type and reason): hand over hand to  initiate washing hands with cloth, then patient perseverating on task. Patient unable to follow verbal/visual/tactile cues to wash face. Mod A to swab mouth with toothette after verbal and hand over hand initiation of task. Upper Body Bathing: Total assistance Upper Body Bathing Details (indicate cue type and reason): Attempted to use verbal/visual/tactile cues and hand over hand to initiate this task but patient unable to follow. Lower Body Bathing: Total assistance Functional mobility during ADLs: (mod A supine to long sit in the bed) General ADL Comments: Patient limited by global aphasia, decreased motor planning, apraxia, difficulty processing information and following commands. She does move her arms spontaneously in bed and was able to wash hands once given verbal/visual/tactile cues but then became perseverative. Unable to wash face or perform UB bathing. Swabbed mouth with toothette and verbal/visual/tactile cues with initial hand over hand and mod A for thoroughness.  Cognition: Cognition Overall Cognitive Status: Difficult to assess Arousal/Alertness: Awake/alert Orientation Level: Oriented to person, Other (comment) (nonverbal) Attention: Sustained Sustained Attention: Impaired Sustained Attention Impairment: Verbal basic, Functional basic Memory: (TBA) Awareness: Impaired Awareness Impairment: Intellectual impairment, Emergent impairment, Anticipatory impairment Problem Solving: Appears intact Behaviors: Restless Safety/Judgment: Impaired Cognition Arousal/Alertness: Awake/alert Behavior During Therapy: Restless Overall Cognitive Status: Difficult to assess Area of Impairment: Following commands, Safety/judgement, Problem solving Following Commands: Follows one step commands consistently, Follows one step commands with increased time, Follows multi-step commands inconsistently Problem Solving: Slow processing, Difficulty sequencing, Requires verbal cues, Requires tactile  cues General Comments: patient responding to commands with verbal and tactile cues Difficult to assess due to:  Impaired communication  Physical Exam: Blood pressure 146/117, pulse 61, temperature 98.5 F (36.9 C), temperature source Oral, resp. rate 12, height 5' 4"  (1.626 m), weight 58.605 kg (129 lb 3.2 oz), SpO2 100 %. Physical Exam Constitutional:  69 year old African-American female  Eyes:  Pupils round and reactive to light  Neck: Normal range of motion. Neck supple. No thyromegaly present.  Cardiovascular:  Cardiac rate controlled  Respiratory: Effort normal and breath sounds normal. No respiratory distress.  GI: Soft. Bowel sounds are normal. She exhibits no distension.  Neurological: She is alert.  Patient globally aphasic. She did not follow simple one-step demonstrated commands. Ate her ice cream independently. Slow to initiate tasks. Mild right facial doop. Right > Left weakness but moves all 4's (very inconsistent due to language deficits.) very alert  Skin: Skin is warm and dry.  Psychiatric:  flat     Lab Results Last 48 Hours    Results for orders placed or performed during the hospital encounter of 04/01/14 (from the past 48 hour(s))  Glucose, capillary Status: Abnormal   Collection Time: 04/05/14 12:05 PM  Result Value Ref Range   Glucose-Capillary 66 (L) 70 - 99 mg/dL  Glucose, capillary Status: Abnormal   Collection Time: 04/05/14 2:21 PM  Result Value Ref Range   Glucose-Capillary 123 (H) 70 - 99 mg/dL  Glucose, capillary Status: None   Collection Time: 04/05/14 4:56 PM  Result Value Ref Range   Glucose-Capillary 92 70 - 99 mg/dL  Glucose, capillary Status: Abnormal   Collection Time: 04/05/14 7:46 PM  Result Value Ref Range   Glucose-Capillary 100 (H) 70 - 99 mg/dL  Glucose, capillary Status: Abnormal   Collection Time: 04/06/14 12:00 AM  Result Value Ref Range    Glucose-Capillary 132 (H) 70 - 99 mg/dL  Heparin level (unfractionated) Status: None   Collection Time: 04/06/14 3:59 AM  Result Value Ref Range   Heparin Unfractionated 0.68 0.30 - 0.70 IU/mL    Comment:   IF HEPARIN RESULTS ARE BELOW EXPECTED VALUES, AND PATIENT DOSAGE HAS BEEN CONFIRMED, SUGGEST FOLLOW UP TESTING OF ANTITHROMBIN III LEVELS.   CBC Status: None   Collection Time: 04/06/14 3:59 AM  Result Value Ref Range   WBC 4.1 4.0 - 10.5 K/uL   RBC 4.89 3.87 - 5.11 MIL/uL   Hemoglobin 13.9 12.0 - 15.0 g/dL   HCT 41.7 36.0 - 46.0 %   MCV 85.3 78.0 - 100.0 fL   MCH 28.4 26.0 - 34.0 pg   MCHC 33.3 30.0 - 36.0 g/dL   RDW 14.8 11.5 - 15.5 %   Platelets 214 150 - 400 K/uL  Phosphorus Status: None   Collection Time: 04/06/14 3:59 AM  Result Value Ref Range   Phosphorus 4.1 2.3 - 4.6 mg/dL  Magnesium Status: None   Collection Time: 04/06/14 3:59 AM  Result Value Ref Range   Magnesium 2.0 1.5 - 2.5 mg/dL  Glucose, capillary Status: Abnormal   Collection Time: 04/06/14 4:07 AM  Result Value Ref Range   Glucose-Capillary 125 (H) 70 - 99 mg/dL  Glucose, capillary Status: Abnormal   Collection Time: 04/06/14 8:31 AM  Result Value Ref Range   Glucose-Capillary 122 (H) 70 - 99 mg/dL  Glucose, capillary Status: Abnormal   Collection Time: 04/06/14 11:58 AM  Result Value Ref Range   Glucose-Capillary 145 (H) 70 - 99 mg/dL  Glucose, capillary Status: Abnormal   Collection Time: 04/06/14 4:17 PM  Result Value Ref Range   Glucose-Capillary 123 (H) 70 -  99 mg/dL  Glucose, capillary Status: None   Collection Time: 04/06/14 8:39 PM  Result Value Ref Range   Glucose-Capillary 93 70 - 99 mg/dL  Glucose, capillary Status: None   Collection Time: 04/06/14 11:56 PM  Result Value Ref Range    Glucose-Capillary 88 70 - 99 mg/dL  Comprehensive metabolic panel Status: Abnormal   Collection Time: 04/07/14 3:06 AM  Result Value Ref Range   Sodium 144 135 - 145 mmol/L    Comment: Please note change in reference range.   Potassium 3.9 3.5 - 5.1 mmol/L    Comment: Please note change in reference range.   Chloride 105 96 - 112 mEq/L   CO2 31 19 - 32 mmol/L   Glucose, Bld 96 70 - 99 mg/dL   BUN 20 6 - 23 mg/dL   Creatinine, Ser 0.90 0.50 - 1.10 mg/dL   Calcium 8.9 8.4 - 10.5 mg/dL   Total Protein 6.4 6.0 - 8.3 g/dL   Albumin 3.0 (L) 3.5 - 5.2 g/dL   AST 28 0 - 37 U/L   ALT 15 0 - 35 U/L   Alkaline Phosphatase 34 (L) 39 - 117 U/L   Total Bilirubin 0.7 0.3 - 1.2 mg/dL   GFR calc non Af Amer 64 (L) >90 mL/min   GFR calc Af Amer 74 (L) >90 mL/min    Comment: (NOTE) The eGFR has been calculated using the CKD EPI equation. This calculation has not been validated in all clinical situations. eGFR's persistently <90 mL/min signify possible Chronic Kidney Disease.    Anion gap 8 5 - 15  Glucose, capillary Status: Abnormal   Collection Time: 04/07/14 4:33 AM  Result Value Ref Range   Glucose-Capillary 113 (H) 70 - 99 mg/dL  Glucose, capillary Status: None   Collection Time: 04/07/14 8:18 AM  Result Value Ref Range   Glucose-Capillary 86 70 - 99 mg/dL      Imaging Results (Last 48 hours)    Dg Abd Portable 1v  04/05/2014 CLINICAL DATA: NG tube placement EXAM: PORTABLE ABDOMEN - 1 VIEW COMPARISON: 04/04/2014 FINDINGS: Feeding tube tip is in the mid portion of the stomach, in similar position to prior study. Nonobstructive bowel gas pattern. IMPRESSION: Feeding tube tip in the mid portion of the stomach. Electronically Signed By: Rolm Baptise M.D. On: 04/05/2014 18:37   Dg Swallowing Func-speech Pathology  04/06/2014 Katherene Ponto Deblois,  CCC-SLP 04/06/2014 3:03 PM Objective Swallowing Evaluation: Modified Barium Swallowing Study Patient Details Name: Rebecca [name] MRN: [id number] Date of Birth: [birth year] Today's Date: 04/06/2014 Time: SLP Start Time (ACUTE ONLY): 1330-SLP Stop Time (ACUTE ONLY): 1400 SLP Time Calculation (min) (ACUTE ONLY): 30 min Past Medical History: Past Medical History Diagnosis Date . Hypertension . Fabry disease 05/01/2013 . Expressive aphasia 03/23/2013 . Atrial fibrillation 05/01/2013 . Tobacco abuse 05/01/2013 . Diabetes mellitus without complication . Depression . Stroke 2015 Past Surgical History: Past Surgical History Procedure Laterality Date . Tee without cardioversion N/A 03/26/2013 Procedure: TRANSESOPHAGEAL ECHOCARDIOGRAM (TEE); Surgeon: Sanda Klein, MD; Location: [facility] ENDOSCOPY; Service: Cardiovascular; Laterality: N/A; . Cesarean section x3 . Hip replacement Right 1990's . Hip replacemet Left 1990's HPI: HPI: 69 y.o. 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 MRI Acute on chronic LEFT frontal lobe infarct (middle cerebral artery territory. Bilateral basal ganglia and  thalamus lacunar infarcts. Recently hospitalized with stroke with expressive aphasia and mild weakness on the right side. Failed RN stroke swallow scren. No Data Recorded Assessment / Plan / Recommendation CHL IP CLINICAL IMPRESSIONS 04/06/2014 Dysphagia Diagnosis Moderate pharyngeal phase dysphagia;Moderate oral phase dysphagia Clinical impression Pt demonstrates a primary oral phase dysphagia with reduced ability to initiate oral movements for PO intake. Pt requires moderate assist for cup to mouth and appropriate head posture  (tilts head back). There is right sided oral weakness with moderate residuals remaining post swallow. Moderate sensorimotor oropharyngeal impairment is characterized by significantly delayed swallow response and pt silently aspirates thin liquids before the swallow (trace). Suspect larger amount of aspirate would elicit strong cough as seen at bedside. Pt tolerates nectar thick liquids and purees without penetration or aspiration though bolus size does need to be supervised to decrease risk. Pt should also be cued to attempt transit of oral residuals with second swallow, or suction to right side should be applied intermittently during meals. Recommend initiating a Dys 1 (puree) diet with nectar thick liquids with full supervision. CHL IP TREATMENT RECOMMENDATION 04/06/2014 Treatment Plan Recommendations Therapy as outlined in treatment plan below CHL IP DIET RECOMMENDATION 04/06/2014 Diet Recommendations Dysphagia 1 (Puree);Nectar-thick liquid Liquid Administration via Cup Medication Administration Whole meds with puree Compensations Slow rate;Small sips/bites;Check for pocketing;Check for anterior loss;Multiple dry swallows after each bite/sip Postural Changes and/or Swallow Maneuvers Seated upright 90 degrees;Out of bed for meals CHL IP OTHER RECOMMENDATIONS 04/06/2014 Recommended Consults (None) Oral Care Recommendations Oral care BID Other Recommendations Order thickener from pharmacy;Have oral suction available CHL IP FOLLOW UP RECOMMENDATIONS 04/06/2014 Follow up Recommendations Inpatient Rehab CHL IP FREQUENCY AND DURATION 04/06/2014 Speech Therapy Frequency (ACUTE ONLY) min 2x/week Treatment Duration 2 weeks Pertinent Vitals/Pain NA SLP Swallow Goals No flowsheet data found. No flowsheet data found. CHL IP REASON FOR REFERRAL 04/06/2014 Reason for Referral Objectively evaluate swallowing function CHL IP ORAL PHASE 04/06/2014 Lips (None) Tongue  (None) Mucous membranes (None) Nutritional status (None) Other (None) Oxygen therapy (None) Oral Phase Impaired Oral - Pudding Teaspoon (None) Oral - Pudding Cup (None) Oral - Honey Teaspoon (None) Oral - Honey Cup (None) Oral - Honey Syringe (None) Oral - Nectar Teaspoon (None) Oral - Nectar Cup Right anterior bolus loss;Weak lingual manipulation;Lingual pumping;Reduced posterior propulsion;Right pocketing in lateral sulci;Lingual/palatal residue;Delayed oral transit Oral - Nectar Straw Other (Comment) Oral - Nectar Syringe (None) Oral - Ice Chips (None) Oral - Thin Teaspoon (None) Oral - Thin Cup Right anterior bolus loss;Weak lingual manipulation;Lingual pumping;Reduced posterior propulsion;Right pocketing in lateral sulci;Lingual/palatal residue;Delayed oral transit Oral - Thin Straw (None) Oral - Thin Syringe (None) Oral - Puree Right anterior bolus loss;Weak lingual manipulation;Lingual pumping;Reduced posterior propulsion;Right pocketing in lateral sulci;Delayed oral transit Oral - Mechanical Soft (None) Oral - Regular Right anterior bolus loss;Weak lingual manipulation;Lingual pumping;Reduced posterior propulsion;Right pocketing in lateral sulci;Lingual/palatal residue;Delayed oral transit;Impaired mastication Oral - Multi-consistency (None) Oral - Pill Weak lingual manipulation;Lingual pumping;Reduced posterior propulsion;Right pocketing in lateral sulci;Delayed oral transit Oral Phase - Comment (None) CHL IP PHARYNGEAL PHASE 04/06/2014 Pharyngeal Phase Impaired Pharyngeal - Pudding Teaspoon (None) Penetration/Aspiration details (pudding teaspoon) (None) Pharyngeal - Pudding Cup (None) Penetration/Aspiration details (pudding cup) (None) Pharyngeal - Honey Teaspoon (None) Penetration/Aspiration details (honey teaspoon) (None) Pharyngeal - Honey Cup (None) Penetration/Aspiration details (honey cup) (None) Pharyngeal - Honey Syringe (None)  Penetration/Aspiration details (honey syringe) (None) Pharyngeal - Nectar Teaspoon (None) Penetration/Aspiration details (nectar teaspoon) (None) Pharyngeal - Nectar Cup Delayed swallow initiation;Pharyngeal residue - valleculae Penetration/Aspiration details (  nectar cup) Material does not enter airway Pharyngeal - Nectar Straw (None) Penetration/Aspiration details (nectar straw) (None) Pharyngeal - Nectar Syringe (None) Penetration/Aspiration details (nectar syringe) (None) Pharyngeal - Ice Chips (None) Penetration/Aspiration details (ice chips) (None) Pharyngeal - Thin Teaspoon (None) Penetration/Aspiration details (thin teaspoon) (None) Pharyngeal - Thin Cup Delayed swallow initiation;Penetration/Aspiration before swallow;Trace aspiration;Pharyngeal residue - valleculae Penetration/Aspiration details (thin cup) Material enters airway, passes BELOW cords without attempt by patient to eject out (silent aspiration) Pharyngeal - Thin Straw (None) Penetration/Aspiration details (thin straw) (None) Pharyngeal - Thin Syringe (None) Penetration/Aspiration details (thin syringe') (None) Pharyngeal - Puree Delayed swallow initiation Penetration/Aspiration details (puree) (None) Pharyngeal - Mechanical Soft (None) Penetration/Aspiration details (mechanical soft) (None) Pharyngeal - Regular Delayed swallow initiation Penetration/Aspiration details (regular) (None) Pharyngeal - Multi-consistency (None) Penetration/Aspiration details (multi-consistency) (None) Pharyngeal - Pill Delayed swallow initiation Penetration/Aspiration details (pill) (None) Pharyngeal Comment (None) No flowsheet data found. No flowsheet data found. Herbie [place], [place] [phone] Lynann Beaver 04/06/2014, 3:01 PM        Medical Problem List and Plan: 1. 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 DVT  Prophylaxis/Anticoagulation: Eliquis. Monitor for any bleeding episodes 3. Pain Management: Tylenol as needed  4. Dysphagia.[name] Dysphagia 1 nectar liquids. Follow-up speech therapy 5. Neuropsych: This patient is not capable of making decisions on her own behalf. 6. Skin/Wound Care: Routine skin checks 7. Fluids/Electrolytes/Nutrition: Strict I and O follow-up chemistries 8. Atrial fibrillation/hypertension. Presently on Eliquis and low-dose Lopressor. Follow-up cardiology services. Cardiac rate control.(Patient on lisinopril 5 mg daily and hydrochlorothiazide 25 mg daily prior to admission and will resume as tolerated) 9. Mood/depression. Lexapro 5 mg daily. Provide emotional support 10. Hyperlipidemia. Lipitor 11. Diabetes mellitus with peripheral neuropathy. Hemoglobin A1c 6.0. Check blood sugars before meals and at bedtime 12. Acute on chronic diastolic congestive heart failure. Monitor for any signs of fluid overload 13. Tobacco abuse. Counseling    Post Admission Physician Evaluation: 1. Functional deficits secondary to cardio-embolic left frontal infarct. 2. Patient is admitted to receive collaborative, interdisciplinary care between the physiatrist, rehab nursing staff, and therapy team. 3. Patient's level of medical complexity and substantial therapy needs in context of that medical necessity cannot be provided at a lesser intensity of care such as a SNF. 4. Patient has experienced substantial functional loss from his/her baseline which was documented above under the "Functional History" and "Functional Status" headings. Judging by the patient's diagnosis, physical exam, and functional history, the patient has potential for functional progress which will result in measurable gains while on inpatient rehab. These gains will be of substantial and practical use upon discharge in facilitating mobility and self-care at the household level. 5. Physiatrist will provide 24 hour management of  medical needs as well as oversight of the therapy plan/treatment and provide guidance as appropriate regarding the interaction of the two. 6. 24 hour rehab nursing will assist with bladder management, bowel management, safety, skin/wound care, disease management, medication administration, pain management and patient education and help integrate therapy concepts, techniques,education, etc. 7. PT will assess and treat for/with: Lower extremity strength, range of motion, stamina, balance, functional mobility, safety, adaptive techniques and equipment, NMR, visual and cognitive perceptual rx, family education, leisure activities. Goals are: supervision. 8. OT will assess and treat for/with: ADL's, functional mobility, safety, upper extremity strength, adaptive techniques and equipment,NMR, visual and cognitive perceptual rx, leisure awareness, ego-support. Goals are: supervision. Therapy may proceed with showering this patient. 9. SLP will assess  and treat for/with: language, swallowing, communication, safety, stroke education. Goals are: min to mod assist. 10. Case Management and Social Worker will assess and treat for psychological issues and discharge planning. 11. Team conference will be held weekly to assess progress toward goals and to determine barriers to discharge. 12. Patient will receive at least 3 hours of therapy per day at least 5 days per week. 13. ELOS: 11-15 days  14. Prognosis: excellent     Meredith Staggers, MD, [place] [facility] 04/07/2014

## 2014-04-07 NOTE — Progress Notes (Signed)
Pt transferred to Rehab from 2C14. Asleep at time of arrival. Nonverbal, per report and upon assessment.  IV heparin infusing. Spoke to pharmacy, as no order received for IV heparin. Per pharmacist, okay to discontinue. Unable to orientate pt to rehab, due to nonverbal issues. Staff will await until family is at bedside to proceed with orientation.

## 2014-04-07 NOTE — Plan of Care (Signed)
Problem: Progression Outcomes Goal: Tolerating diet/TF at goal rate Outcome: Completed/Met Date Met:  04/07/14 Pt is off of tube feed and tolerating a dysphagia 1 diet (nectar thick)

## 2014-04-07 NOTE — Care Management Note (Addendum)
    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  YES (If response is "NO", the following Medicare IM given date fields will be blank) Date Medicare IM given:  04/07/2014 Medicare IM given by:  [place] Throckmorton Date Additional Medicare IM given:   Additional Medicare IM given by:    Discharge Disposition:  IP REHAB FACILITY  Per UR Regulation:  Reviewed for med. necessity/level of care/duration of stay  If discussed at Long Length of Stay Meetings, dates discussed:    Comments:  04/07/13 1504 [facility] RN MSN BSN CCM MD determined pt appropriate for admission to CIR.  Inpt rehab liaison talked with son and stated he agreed to assist with discharge planning when she discharges from rehab.  04/06/14 0733 Verdis Prime RN MSN BSN CCM Transferred to [facility], therapies recommend CIR but chart review indicate pt lives alone.  Will notify CSW re probable SNF. 1513 TC to number listed for son, [employer], no answer, VM message left requesting return call. 1540 Per son, pt lives with another son who is disabled and has ESRD on HD.  Mr Depaepe also states that pt is attempting to sell her house 2/2 inability to manage same, and was borderline functional PTA.  He inquired about in-home assistance for both pt and the son she lives with - provided information about [facility]  PCS.

## 2014-04-07 NOTE — Progress Notes (Signed)
SUBJECTIVE:   Nonverbal but alert and responsive.  Denies pain.   PHYSICAL EXAM Filed Vitals:   04/07/14 0436 04/07/14 0500 04/07/14 0600 04/07/14 0700  BP:   138/79   Pulse:  60 58 62  Temp: 98.3 F (36.8 C)     TempSrc: Oral     Resp:  Height:      Weight:      SpO2:  100% 100% 98%   General:  No distress Lungs:  Clear with decreased breath sounds Heart:  RRR Abdomen:  Positive bowel sounds, no rebound no guarding Extremities:  No edema   LABS:  Results for orders placed or performed during the hospital encounter of 04/01/14 (from the past 24 hour(s))  Glucose, capillary     Status: Abnormal   Collection Time: 04/06/14  8:31 AM  Result Value Ref Range   Glucose-Capillary 122 (H) 70 - 99 mg/dL  Glucose, capillary     Status: Abnormal   Collection Time: 04/06/14 11:58 AM  Result Value Ref Range   Glucose-Capillary 145 (H) 70 - 99 mg/dL  Glucose, capillary     Status: Abnormal   Collection Time: 04/06/14  4:17 PM  Result Value Ref Range   Glucose-Capillary 123 (H) 70 - 99 mg/dL  Glucose, capillary     Status: None   Collection Time: 04/06/14  8:39 PM  Result Value Ref Range   Glucose-Capillary 93 70 - 99 mg/dL  Glucose, capillary     Status: None   Collection Time: 04/06/14 11:56 PM  Result Value Ref Range   Glucose-Capillary 88 70 - 99 mg/dL  Comprehensive metabolic panel     Status: Abnormal   Collection Time: 04/07/14  3:06 AM  Result Value Ref Range   Sodium 144 135 - 145 mmol/L   Potassium 3.9 3.5 - 5.1 mmol/L   Chloride 105 96 - 112 mEq/L   CO2 31 19 - 32 mmol/L   Glucose, Bld 96 70 - 99 mg/dL   BUN 20 6 - 23 mg/dL   Creatinine, Ser 1.61 0.50 - 1.10 mg/dL   Calcium 8.9 8.4 - 09.6 mg/dL   Total Protein 6.4 6.0 - 8.3 g/dL   Albumin 3.0 (L) 3.5 - 5.2 g/dL   AST 28 0 - 37 U/L   ALT 15 0 - 35 U/L   Alkaline Phosphatase 34 (L) 39 - 117 U/L   Total Bilirubin 0.7 0.3 - 1.2 mg/dL   GFR calc non Af Amer 64 (L) >90 mL/min   GFR calc Af Amer 74  (L) >90 mL/min   Anion gap 8 5 - 15  Glucose, capillary     Status: Abnormal   Collection Time: 04/07/14  4:33 AM  Result Value Ref Range   Glucose-Capillary 113 (H) 70 - 99 mg/dL    Intake/Output Summary (Last 24 hours) at 04/07/14 0827 Last data filed at 04/07/14 0700  Gross per 24 hour  Intake 845.15 ml  Output    250 ml  Net 595.15 ml    ASSESSMENT AND PLAN:  PAF:   NSR, now on Eliquis.  Discuss with neurology.  Does she need both ASA and Eliquis.  She would not need ASA from a cardiac standpoint.    ELEVATED TROPONIN:  Mild troponin bump without new all motion abnormality on echo.  No plans for further noninvasive evaluation.    ACUTE ON CHRONIC DIASTOLIC HF:    She seems to be euvolemic.  She can follow  with Dr. Graciela Husbands as an out patient.  Please call us with further questions.     Rebecca [name] [facility] 04/07/2014 8:27 AM

## 2014-04-07 NOTE — Progress Notes (Signed)
Rehab admissions - I received a call back from pt's son [name] and he was supportive of his mother coming to inpatient rehab. Further information was given about our rehab program and questions were answered. Son also said he is willing for pt to come live with him at the end of rehab but did share concerns over possibly needing to assist her with self care skills. Support was provided and I explained that skilled nursing could be explored at that point if needed.  I received medical clearance from Dr. Joseph Art and we will admit pt today to inpatient rehab. I also updated [place], social work.  I will update pt soon and discussed to meet up with pt's son [name] later today to complete admission paperwork.  Please call me with any questions. Thanks.  Juliann Mule, PT Rehabilitation Admissions Coordinator [phone]

## 2014-04-07 NOTE — Progress Notes (Signed)
Patient is being transferred to inpatient rehab per wheelchair.  [name], patient's son is notified of this transfer.

## 2014-04-07 NOTE — Progress Notes (Signed)
Rehab admissions - I met with pt in follow up to rehab MD consult to explain the possibility of inpatient rehab. Pt was aphasic and could only nod her head appropriately. Informational brochures were given and I left a voicemail with pt's son as pt was unable to give any baseline information.  I also spoke with Blain Pais, case manager about pt's case.  I will update later today after I have spoken with family. Pt is a good candidate for CIR, however it is likely that son (and other disabled son) may not be able to provide needed care at the end of a possible rehab stay. It is likely that pt may need skilled nursing at the end of rehab.  Please call me with any questions. Thanks.  Nanetta Batty, PT Rehabilitation Admissions Coordinator [phone]

## 2014-04-07 NOTE — Discharge Summary (Signed)
Physician Discharge Summary  Rebecca [name] ZOX:[id number] DOB: November 28, 1945 DOA: 04/01/2014  PCP: Rebecca Junes, MD  Admit date: 04/01/2014 Discharge date: 04/07/2014  Time spent: 40 minutes Recommendations for Outpatient Follow-up:  Global aphagia:  -1/14 brain MRI/MRA Acute on chronic LEFT frontal lobe infarct (middle cerebral artery territory)/ Bilateral basal ganglia and thalamus lacunar infarcts. -Carotid dopplers w/o signif stenosis  -1/19 past swallow test  Dysphagia 1 (puree);Nectar-thick liquid; would continue Jevity 1.2 CAL 3ml/hr in order to meet her caloric intake needs -Follow-up with PCP once discharged from CIR  HTN:  -Continue metoprolol 12.5 mg BID  Elevated troponin:  -peaked at 0.28 - Cardiology following - without new all motion abnormality on echo no further eval planning   Acute on chronic combined systolic and diastolic CHF: -Per Dr. 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 -Per cardiology appears to be euvolemic   Pulmonary hypertension -See HTN  Paroxysmal Afib:  -Currently in NSR -Heart rate is well controlled.  -Continue Eliquis 5 mg BID  Diabetes type 2 controlled -1/15 hemoglobin A1c =6.0 -1/19 past swallow test  Dysphagia 1 (puree);Nectar-thick liquid; -NG tube placed continue to feed Jevity 1.2 CAL, goal rate of 50ml/hr in order for patient to meet caloric needs  Hyperlipidemia:  -LDL was 104 on 03/24/13. -Continue Lipitor 40 mg daily  Fabry disease:  -Agalsidase 1 mg/kilogram q 14 days. Last dose per son last dose administered on 8 January week.  Protein-calorie malnutrition, severe -Continue Jevity 1.2 CAL, goal rate of 35ml/hr -See diabetes type 2 controlled       Discharge Diagnoses:  Principal Problem:   Expressive aphasia Active Problems:   Diabetes  mellitus   Hypertension   Fabry disease   Atrial fibrillation   Tobacco abuse   Elevated troponin   Stroke   Aphasia   Acute diastolic heart failure   Elevated troponin I level   Essential hypertension   Acute on chronic combined systolic and diastolic CHF (congestive heart failure)   Pulmonary hypertension   Chronic atrial fibrillation   Diabetes type 2, controlled   HLD (hyperlipidemia)   Global aphasia   Paroxysmal atrial fibrillation   Hyperlipidemia   Protein-calorie malnutrition, severe   Discharge Condition: Stable  Diet recommendation: Carb modified  Filed Weights   04/02/14 1800 04/03/14 0500 04/05/14 1442  Weight: 62.5 kg (137 lb 12.6 oz) 62.6 kg (138 lb 0.1 oz) 58.605 kg (129 lb 3.2 oz)    History of present illness:  Rebecca [name] is a 69 y.o.BF PMHx Depression, hypertension, atrial fibrillation on Xarelto, Fabry disease, tobacco abuse, diabetes mellitus Type 2 uncontrolled, stroke, who presents with aphasia.  Patient was recently hospitalized because of stroke. He was discharged on Xeralto for Afib. At that time she was noted to have expressive aphasia and mild weakness on the right side. Patient was brought to hospital today after her son found patient leaning on the counter "slumped over and not communicating and was shivering in all 4 extremities".Patiwent was found to have oxygen desaturation to 88% on room air, which improved to 100% after placed on BIPAP. When I evaluated the patient in ED, she was awake, but is nonverbal. She only followed some simple commands. It is difficult to assess whether patient has any pain.   Work up in the ED demonstrates CT-head no bleeding. Chest x-rays negative.No leukocytosis. Her EKG showed PVC  with T-wave inversion in lead 1. Trop is elevated at 0.25. Patient is admitted to inpatient for further evaluation and treatment. Neurology and cardiology were consulted by ED. During his hospitalization patient was found to  have Acute on chronic LEFT frontal lobe infarct (middle Cerebral artery territory)/ Bilateral basal ganglia and thalamus lacunar infarcts.   Consultants: Dr Georga Hacking Camilo,(Neurology) Dr. Delia Heady (neurology, stroke) Dr. Marca Ancona (cardiology )    Procedure/Significant Events: 1/14 MRI/MRA Brain without contrast;- Acute on chronic LEFT frontal lobe infarct (middle cerebral artery territory). - Bilateral basal ganglia and thalamus lacunar infarcts. -Bifrontal encephalomalacia, further propagation on the RIGHT, new on the LEFT consistent with interval bilateral posterior cerebral artery territory infarcts. -no large vessel occlusion. Slightly decreased flow related enhancement of the LEFT middle cerebral artery.  1/14 EEG: normal asleep EEG. No electrographic seizures noted 1/14 echocardiogram;- Left ventricle: severeconcentric hypertrophy. -LVEF=45% to 50%.  -(grade 2 diastolic dysfunction). - Aortic valve:Moderate regurgitation. - Left atrium: severely dilated. -Right atrium: moderately dilated. -Pulmonary arteries: PApeak pressure: 42 mm Hg (S).  Discharge Exam: Filed Vitals:   04/07/14 0700 04/07/14 0833 04/07/14 1049 04/07/14 1152  BP:  146/117 163/87 108/85  Pulse: 62 61 81 60  Temp:  98.5 F (36.9 C)  98.9 F (37.2 C)  TempSrc:  Oral  Oral  Resp: 21 12  24   Height:      Weight:      SpO2: 98% 100%  98%    General: No acute respiratory distress, alert and interactive, sitting in chair comfortably  Lungs: CTA th/o w/o wheeze  Cardiovascular: Regular rate and rhythm without murmur gallop or rub  Abdomen: Nontender, nondistended, soft, bowel sounds positive, no rebound, no ascites, no appreciable mass Extremities: No significant cyanosis, clubbing, or edema bilateral lower extremities Neurologic: Continued global aphasia Discharge Instructions     Medication List    STOP taking these medications        hydrochlorothiazide 25 MG tablet   Commonly known as:  HYDRODIURIL     lisinopril 5 MG tablet  Commonly known as:  PRINIVIL,ZESTRIL     metoprolol succinate 25 MG 24 hr tablet  Commonly known as:  TOPROL-XL     rivaroxaban 20 MG Tabs tablet  Commonly known as:  XARELTO      TAKE these medications        acetaminophen 500 MG tablet  Commonly known as:  TYLENOL  Take 500 mg by mouth every 6 (six) hours as needed for moderate pain.     agalsidase beta 1 mg/kg in sodium chloride 0.9 %  Inject 1 mg/kg into the vein every 14 (fourteen) days.     apixaban 5 MG Tabs tablet  Commonly known as:  ELIQUIS  Take 1 tablet (5 mg total) by mouth 2 (two) times daily.     aspirin EC 81 MG tablet  Take 81 mg by mouth daily.     atorvastatin 40 MG tablet  Commonly known as:  LIPITOR  Take 1 tablet (40 mg total) by mouth daily at 6 PM.     brimonidine 0.1 % Soln  Commonly known as:  ALPHAGAN P  Place 1 drop into both eyes 2 (two) times daily.     carboxymethylcellulose 1 % ophthalmic solution  Apply 1 drop to eye as needed (for dry eyes).     escitalopram 5 MG tablet  Commonly known as:  LEXAPRO  Take 1 tablet (5 mg total) by mouth daily.     feeding supplement (  JEVITY 1.2 CAL) Liqd  Place 1,000 mLs into feeding tube continuous.     fexofenadine 180 MG tablet  Commonly known as:  ALLEGRA  Take 180 mg by mouth daily.     Ibuprofen-Diphenhydramine Cit 200-38 MG Tabs  Take 1 tablet by mouth at bedtime as needed (for sleep).     metoprolol tartrate 25 MG tablet  Commonly known as:  LOPRESSOR  Take 0.5 tablets (12.5 mg total) by mouth 2 (two) times daily.     multivitamin with minerals Tabs tablet  Take 1 tablet by mouth daily.     RESOURCE THICKENUP CLEAR Powd  Take 15,000 g by mouth as needed.     senna-docusate 8.6-50 MG per tablet  Commonly known as:  Senokot-S  Take 1 tablet by mouth at bedtime as needed for mild constipation.       No Known Allergies Follow-up Information    Follow up with  Rebecca Junes, MD. Schedule an appointment as soon as possible for a visit in 2 weeks.   Specialty:  Family Medicine   Why:  Hospital follow-up stroke, uncontrolled diabetes, HTN   Contact information:   [address] [address] [place] [address] [phone]        The results of significant diagnostics from this hospitalization (including imaging, microbiology, ancillary and laboratory) are listed below for reference.    Significant Diagnostic Studies: Dg Chest 2 View  04/02/2014   CLINICAL DATA:  Shortness of breath, and aphasia  EXAM: CHEST  2 VIEW  COMPARISON:  Portable chest x-ray of April 01, 2014  FINDINGS: The lungs are well-expanded and clear. The cardiopericardial silhouette is enlarged. The pulmonary vascularity is mildly prominent centrally without cephalization. There is no pulmonary edema. There is tortuosity of the descending thoracic aorta. The bony thorax exhibits no acute abnormalities.  IMPRESSION: Cardiomegaly without pulmonary edema. There is no pneumonia. Stable appearance of the chest since yesterday's study.   Electronically Signed   By: David  [place]   On: 04/02/2014 07:38   Ct Head Wo Contrast  04/01/2014   CLINICAL DATA:  Unresponsive tonight.  Initial encounter.  EXAM: CT HEAD WITHOUT CONTRAST  TECHNIQUE: Contiguous axial images were obtained from the base of the skull through the vertex without intravenous contrast.  COMPARISON:  Head CT 12/25/2003.  MRI brain 03/24/2013.  FINDINGS: There is no evidence of acute intracranial hemorrhage, mass lesion, brain edema or extra-axial fluid collection. The ventricles and subarachnoid spaces are mildly prominent but stable. Extensive chronic small vessel ischemic changes and old cortical infarcts are again noted bilaterally. There is increased low-density and gyral swelling in the left frontotemporal region (images 13-16) which could reflect a subacute infarct. Intracranial vascular calcifications again noted.   The visualized paranasal sinuses, mastoid air cells and middle ears are clear. The calvarium is intact.  IMPRESSION: Extensive chronic ischemic changes bilaterally with possible subacute extension in the left frontotemporal region. No evidence of acute intracranial hemorrhage.   Electronically Signed   By: Roxy Horseman M.D.   On: 04/01/2014 20:55   Mr Brain Wo Contrast  04/02/2014   CLINICAL DATA:  Recent hospitalization for stroke, found noncommunicating and shivering today.  EXAM: MRI HEAD WITHOUT CONTRAST  MRA HEAD WITHOUT CONTRAST  TECHNIQUE: Multiplanar, multiecho pulse sequences of the brain and surrounding structures were obtained without intravenous contrast. Angiographic images of the head were obtained using MRA technique without contrast.  COMPARISON:  CT of the head March 24, 2013 and CT of the  head April 01, 2014  FINDINGS: MRI HEAD FINDINGS  Moderately motion degraded examination. Reduced diffusion in LEFT frontal lobe, spanning greater territory than on prior MRI. Corresponding low ADC values. Multiple subcentimeter central with a lesser extent peripheral foci of susceptibility artifact, increased from prior examination though, in a similar distribution.  Moderate ventriculomegaly, likely on the basis of global parenchymal brain volume loss as there is overall commensurate enlargement of cerebral sulci and cerebellar folia. Bilateral small cerebellar infarcts, relatively unchanged. Bilateral occipital lobe encephalomalacia, advanced on the RIGHT, new on the LEFT. Severe confluent supratentorial white matter FLAIR T2 hyperintensities without mass effect. Cystic encephalomalacia of the LEFT frontal lobe underlying the area of T2 bright cytotoxic edema. Bilateral basal ganglia and thalamus lacunar infarcts.  No abnormal extra-axial fluid collections. Status post bilateral ocular lens implants. RIGHT maxillary mucosal retention cyst without paranasal sinus air-fluid levels. The mastoid air cells are  well aerated. Mild sellar expansion, can be seen with arachnoid cyst or less likely empty sella considering a thin rind of apparent pituitary tissue along the floor. No cerebellar tonsillar ectopia. No suspicious calvarial bone marrow signal.  MRA HEAD FINDINGS  Moderately motion degraded examination.  Anterior circulation: Flow related enhancement observed within the cervical, petrous, cavernous and supra clinoid internal carotid arteries. Due to motion, there is a duplicated appearance of the carotid siphons. Flow related enhancement within the anterior cerebral arteries, robust flow related enhancement of the RIGHT middle cerebral artery, somewhat less robust flow related enhancement of LEFT middle cerebral artery without focal conclusion.  Posterior circulation: LEFT vertebral artery is dominant. Very poor visualization of the proximal basilar artery, attributed to motion and tortuosity, with normal flow related enhancement of basilar tip. Normal flow early enhancement of the posterior cerebral arteries.  Dolichoectatic appearance intracranial vessels without large vessel occlusion. Limited assessment for aneurysm or vasculopathy due to the degree of motion.  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  Susceptibility artifact in a pattern suggesting sequela of chronic hypertension with severe white matter changes which may be seen with chronic small vessel ischemic disease. Bilateral basal ganglia and thalamus lacunar infarcts.  Bifrontal encephalomalacia, further propagation on the RIGHT, new on the LEFT consistent with interval bilateral posterior cerebral artery territory infarcts.  MRA HEAD: Moderately motion degraded examination, no large vessel occlusion. Slightly decreased flow related enhancement of the LEFT middle cerebral artery. Findings may be better characterized on CTA of the head as patient had difficulty remaining still for the  examination. In addition, poor visualization of the proximal basilar artery which is likely artifact though could be confirmed on CTA of the head.  Dolicoectatic intracranial vessels can be seen with chronic hypertension.   Electronically Signed   By: Awilda Metro   On: 04/02/2014 06:15   Dg Chest Port 1 View  04/01/2014   CLINICAL DATA:  Altered mental status with weakness. Possible stroke. Initial encounter.  EXAM: PORTABLE CHEST - 1 VIEW  COMPARISON:  12/24/2013.  FINDINGS: 1812 hr. There is stable cardiomegaly and vascular ectasia. The lungs are clear. There is no pleural effusion or pneumothorax. No acute osseous findings are seen. Multiple telemetry leads overlie the chest.  IMPRESSION: Stable cardiomegaly.  No acute cardiopulmonary process demonstrated.   Electronically Signed   By: Roxy Horseman M.D.   On: 04/01/2014 18:41   Dg Abd Portable 1v  04/05/2014   CLINICAL DATA:  NG tube placement  EXAM: PORTABLE ABDOMEN - 1 VIEW  COMPARISON:  04/04/2014  FINDINGS: Feeding tube tip is in the mid portion of the stomach, in similar position to prior study. Nonobstructive bowel gas pattern.  IMPRESSION: Feeding tube tip in the mid portion of the stomach.   Electronically Signed   By: Charlett Nose M.D.   On: 04/05/2014 18:37   Dg Abd Portable 1v  04/04/2014   CLINICAL DATA:  69 year old female undergoing feeding tube placement  EXAM: PORTABLE ABDOMEN - 1 VIEW  COMPARISON:  Chest x-ray 04/02/2014  FINDINGS: Interval placement of a weighted tip enteric feeding tube. The tip of the tube overlies the body of the stomach. The bowel gas pattern is not obstructed. Atherosclerotic calcifications are present within the abdominal aorta and the iliac arteries. Rotary levoconvex scoliosis with multilevel degenerative disc disease. Surgical changes of bilateral total hip arthroplasties. Incompletely imaged cardiomegaly.  IMPRESSION: The tip of the enteric feeding tube overlies the mid gastric body.   Electronically  Signed   By: Malachy Moan M.D.   On: 04/04/2014 14:21   Dg Swallowing Func-speech Pathology  04/06/2014   Riley Nearing [place], CCC-SLP     04/06/2014  3:03 PM  Objective Swallowing Evaluation: Modified Barium Swallowing Study   Patient Details  Name: BETTYE AMMAN MRN: [id number] Date of Birth: 1946-03-05  Today's Date: 04/06/2014 Time: SLP Start Time (ACUTE ONLY): 1330-SLP Stop Time (ACUTE  ONLY): 1400 SLP Time Calculation (min) (ACUTE ONLY): 30 min  Past Medical History:  Past Medical History  Diagnosis Date  . Hypertension   . 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 Expressive aphasia 03/23/2013  . Atrial fibrillation 05/01/2013  . Tobacco abuse 05/01/2013  . Diabetes mellitus without complication   . Depression   . Stroke     2015   Past Surgical History:  Past Surgical History  Procedure Laterality Date  . Tee without cardioversion N/A 03/26/2013    Procedure: TRANSESOPHAGEAL ECHOCARDIOGRAM (TEE);  Surgeon:  Thurmon Fair, MD;  Location: [facility] ENDOSCOPY;  Service:  Cardiovascular;  Laterality: N/A;  . Cesarean section      x3  . Hip replacement Right 1990's  . Hip replacemet Left 1990's   HPI:  HPI: 69 y.o. female with a past medical history of hypertension,  atrial fibrillation, Fabry disease (Fabry disease causes a  buildup of fatty material in the autonomic nervous system, eyes,  kidneys, and cardiovascular system) , hx of tobacco abuse,  diabetes mellitus, stroke, depression, who presents with aphasia.  MRI Acute on chronic LEFT frontal lobe infarct (middle cerebral  artery territory. Bilateral basal ganglia and thalamus lacunar  infarcts. Recently hospitalized with stroke with expressive  aphasia and mild weakness on the right side. Failed RN stroke  swallow scren.  No Data Recorded  Assessment / Plan / Recommendation CHL IP CLINICAL IMPRESSIONS 04/06/2014  Dysphagia Diagnosis Moderate pharyngeal phase dysphagia;Moderate  oral phase dysphagia  Clinical impression Pt demonstrates a primary oral phase  dysphagia  with reduced ability to initiate oral movements for PO  intake. Pt requires moderate assist for cup to mouth and  appropriate head posture (tilts head back). There is right sided  oral weakness with moderate residuals remaining post swallow.  Moderate sensorimotor oropharyngeal impairment is characterized  by significantly delayed swallow response and pt silently  aspirates thin liquids before the swallow (trace). Suspect larger  amount of aspirate would elicit strong cough as seen at bedside.  Pt tolerates nectar thick liquids and purees without penetration  or aspiration though bolus size does need to be supervised to  decrease risk. Pt should also be cued to attempt transit of oral  residuals with second swallow, or suction to right side should be  applied intermittently during meals. Recommend initiating a Dys 1  (puree) diet with nectar thick liquids with full supervision.       CHL IP TREATMENT RECOMMENDATION 04/06/2014  Treatment Plan Recommendations Therapy as outlined in treatment  plan below     CHL IP DIET RECOMMENDATION 04/06/2014  Diet Recommendations Dysphagia 1 (Puree);Nectar-thick liquid  Liquid Administration via Cup Medication Administration Whole  meds with puree  Compensations Slow rate;Small sips/bites;Check for  pocketing;Check for anterior loss;Multiple dry swallows after  each bite/sip  Postural Changes and/or Swallow Maneuvers Seated upright 90  degrees;Out of bed for meals     CHL IP OTHER RECOMMENDATIONS 04/06/2014  Recommended Consults (None)  Oral Care Recommendations Oral care BID  Other Recommendations Order thickener from pharmacy;Have oral  suction available     CHL IP FOLLOW UP RECOMMENDATIONS 04/06/2014  Follow up Recommendations Inpatient Rehab     CHL IP FREQUENCY AND DURATION 04/06/2014  Speech Therapy Frequency (ACUTE ONLY) min 2x/week  Treatment Duration 2 weeks     Pertinent Vitals/Pain NA    SLP Swallow Goals No flowsheet data found.  No flowsheet data found.    CHL IP REASON FOR  REFERRAL 04/06/2014  Reason for Referral Objectively evaluate swallowing function     CHL IP ORAL PHASE 04/06/2014  Lips (None)  Tongue (None)  Mucous membranes (None)  Nutritional status (None)  Other (None)  Oxygen therapy (None)  Oral Phase Impaired  Oral - Pudding Teaspoon (None)  Oral - Pudding Cup (None)  Oral - Honey Teaspoon (None)  Oral - Honey Cup (None)  Oral - Honey Syringe (None)  Oral - Nectar Teaspoon (None)  Oral - Nectar Cup Right anterior bolus loss;Weak lingual  manipulation;Lingual pumping;Reduced posterior propulsion;Right  pocketing in lateral sulci;Lingual/palatal residue;Delayed oral  transit  Oral - Nectar Straw Other (Comment)  Oral - Nectar Syringe (None)  Oral - Ice Chips (None)  Oral - Thin Teaspoon (None)  Oral - Thin Cup Right anterior bolus loss;Weak lingual  manipulation;Lingual pumping;Reduced posterior propulsion;Right  pocketing in lateral sulci;Lingual/palatal residue;Delayed oral  transit  Oral - Thin Straw (None)  Oral - Thin Syringe (None)  Oral - Puree Right anterior bolus loss;Weak lingual  manipulation;Lingual pumping;Reduced posterior propulsion;Right  pocketing in lateral sulci;Delayed oral transit  Oral - Mechanical Soft (None)  Oral - Regular Right anterior bolus loss;Weak lingual  manipulation;Lingual pumping;Reduced posterior propulsion;Right  pocketing in lateral sulci;Lingual/palatal residue;Delayed oral  transit;Impaired mastication  Oral - Multi-consistency (None)  Oral - Pill Weak lingual manipulation;Lingual pumping;Reduced  posterior propulsion;Right pocketing in lateral sulci;Delayed  oral transit  Oral Phase - Comment (None)      CHL IP PHARYNGEAL PHASE 04/06/2014  Pharyngeal Phase Impaired  Pharyngeal - Pudding Teaspoon (None)  Penetration/Aspiration details (pudding teaspoon) (None)  Pharyngeal - Pudding Cup (None)  Penetration/Aspiration details (pudding cup) (None)  Pharyngeal - Honey Teaspoon (None)  Penetration/Aspiration details (honey teaspoon) (None)   Pharyngeal - Honey Cup (None)  Penetration/Aspiration details (honey cup) (None)  Pharyngeal - Honey Syringe (None)  Penetration/Aspiration details (honey syringe) (None)  Pharyngeal - Nectar Teaspoon (None)  Penetration/Aspiration details (nectar teaspoon) (None)  Pharyngeal - Nectar Cup Delayed swallow initiation;Pharyngeal  residue - valleculae  Penetration/Aspiration details (nectar cup) Material does not  enter airway  Pharyngeal - Nectar Straw (None)  Penetration/Aspiration details (nectar straw) (None)  Pharyngeal - Nectar Syringe (None)  Penetration/Aspiration details (nectar syringe) (None)  Pharyngeal - Ice Chips (None)  Penetration/Aspiration details (ice chips) (None)  Pharyngeal - Thin Teaspoon (None)  Penetration/Aspiration details (thin teaspoon) (None)  Pharyngeal - Thin Cup Delayed swallow  initiation;Penetration/Aspiration before swallow;Trace  aspiration;Pharyngeal residue - valleculae  Penetration/Aspiration details (thin cup) Material enters airway,  passes BELOW cords without attempt by patient to eject out  (silent aspiration)  Pharyngeal - Thin Straw (None)  Penetration/Aspiration details (thin straw) (None)  Pharyngeal - Thin Syringe (None)  Penetration/Aspiration details (thin syringe') (None)  Pharyngeal - Puree Delayed swallow initiation  Penetration/Aspiration details (puree) (None)  Pharyngeal - Mechanical Soft (None)  Penetration/Aspiration details (mechanical soft) (None)  Pharyngeal - Regular Delayed swallow initiation  Penetration/Aspiration details (regular) (None)  Pharyngeal - Multi-consistency (None)  Penetration/Aspiration details (multi-consistency) (None)  Pharyngeal - Pill Delayed swallow initiation  Penetration/Aspiration details (pill) (None)  Pharyngeal Comment (None)     No flowsheet data found.  No flowsheet data found.        [name], [place] CCC-SLP [phone]  Dyanne Iha Riley Nearing 04/06/2014, 3:01 PM    Mr Maxine Glenn Head/brain Wo Cm  04/02/2014   CLINICAL DATA:   Recent hospitalization for stroke, found noncommunicating and shivering today.  EXAM: MRI HEAD WITHOUT CONTRAST  MRA HEAD WITHOUT CONTRAST  TECHNIQUE: Multiplanar, multiecho pulse sequences of the brain and surrounding structures were obtained without intravenous contrast. Angiographic images of the head were obtained using MRA technique without contrast.  COMPARISON:  CT of the head March 24, 2013 and CT of the head April 01, 2014  FINDINGS: MRI HEAD FINDINGS  Moderately motion degraded examination. Reduced diffusion in LEFT frontal lobe, spanning greater territory than on prior MRI. Corresponding low ADC values. Multiple subcentimeter central with a lesser extent peripheral foci of susceptibility artifact, increased from prior examination though, in a similar distribution.  Moderate ventriculomegaly, likely on the basis of global parenchymal brain volume loss as there is overall commensurate enlargement of cerebral sulci and cerebellar folia. Bilateral small cerebellar infarcts, relatively unchanged. Bilateral occipital lobe encephalomalacia, advanced on the RIGHT, new on the LEFT. Severe confluent supratentorial white matter FLAIR T2 hyperintensities without mass effect. Cystic encephalomalacia of the LEFT frontal lobe underlying the area of T2 bright cytotoxic edema. Bilateral basal ganglia and thalamus lacunar infarcts.  No abnormal extra-axial fluid collections. Status post bilateral ocular lens implants. RIGHT maxillary mucosal retention cyst without paranasal sinus air-fluid levels. The mastoid air cells are well aerated. Mild sellar expansion, can be seen with arachnoid cyst or less likely empty sella considering a thin rind of apparent pituitary tissue along the floor. No cerebellar tonsillar ectopia. No suspicious calvarial bone marrow signal.  MRA HEAD FINDINGS  Moderately motion degraded examination.  Anterior circulation: Flow related enhancement observed within the cervical, petrous, cavernous and  supra clinoid internal carotid arteries. Due to motion, there is a duplicated appearance of the carotid siphons. Flow related enhancement within the anterior cerebral arteries, robust flow related enhancement of the RIGHT middle cerebral artery, somewhat less robust flow related enhancement of LEFT middle cerebral artery without focal conclusion.  Posterior circulation: LEFT vertebral artery is dominant. Very poor visualization of the proximal basilar artery, attributed to motion and tortuosity, with normal flow related enhancement of basilar tip. Normal flow early enhancement of the posterior cerebral arteries.  Dolichoectatic appearance intracranial vessels without large vessel occlusion. Limited assessment for aneurysm or vasculopathy due to the degree of motion.  IMPRESSION: MRI HEAD: Acute on chronic LEFT frontal lobe infarct (middle cerebral  artery territory).  Susceptibility artifact in a pattern suggesting sequela of chronic hypertension with severe white matter changes which may be seen with chronic small vessel ischemic disease. Bilateral basal ganglia and thalamus lacunar infarcts.  Bifrontal encephalomalacia, further propagation on the RIGHT, new on the LEFT consistent with interval bilateral posterior cerebral artery territory infarcts.  MRA HEAD: Moderately motion degraded examination, no large vessel occlusion. Slightly decreased flow related enhancement of the LEFT middle cerebral artery. Findings may be better characterized on CTA of the head as patient had difficulty remaining still for the examination. In addition, poor visualization of the proximal basilar artery which is likely artifact though could be confirmed on CTA of the head.  Dolicoectatic intracranial vessels can be seen with chronic hypertension.   Electronically Signed   By: Awilda Metro   On: 04/02/2014 06:15    Microbiology: Recent Results (from the past 240 hour(s))  MRSA PCR Screening     Status: None   Collection Time:  04/02/14  1:17 AM  Result Value Ref Range Status   MRSA by PCR NEGATIVE NEGATIVE Final    Comment:        The GeneXpert MRSA Assay (FDA approved for NASAL specimens only), is one component of a comprehensive MRSA colonization surveillance program. It is not intended to diagnose MRSA infection nor to guide or monitor treatment for MRSA infections.      Labs: Basic Metabolic Panel:  Recent Labs Lab 04/01/14 1745 04/02/14 0420 04/03/14 1005 04/05/14 0305 04/06/14 0359 04/07/14 0306  NA 143 145 141  --   --  144  K 4.3 3.9 4.0  --   --  3.9  CL 105 109 102  --   --  105  CO2 --   --  31  GLUCOSE 130* 149* 117*  --   --  96  BUN --   --  20  CREATININE 1.04 0.97 1.03  --   --  0.90  CALCIUM 9.6 9.5 9.1  --   --  8.9  MG  --   --  1.8 2.0 2.0  --   PHOS  --   --   --  3.6 4.1  --    Liver Function Tests:  Recent Labs Lab 04/01/14 1745 04/03/14 1005 04/07/14 0306  AST 38* 36 28  ALT ALKPHOS 36* 35* 34*  BILITOT 0.9 0.8 0.7  PROT 7.5 7.3 6.4  ALBUMIN 3.9 3.6 3.0*   No results for input(s): LIPASE, AMYLASE in the last 168 hours. No results for input(s): AMMONIA in the last 168 hours. CBC:  Recent Labs Lab 04/01/14 1745 04/03/14 1005 04/05/14 0305 04/06/14 0359  WBC 5.4 5.6 4.4 4.1  NEUTROABS 3.4  --   --   --   HGB 12.7 12.4 12.3 13.9  HCT 38.6 37.5 37.1 41.7  MCV 83.9 83.3 82.1 85.3  PLT 181 200 205 214   Cardiac Enzymes:  Recent Labs Lab 04/01/14 1745 04/01/14 2210 04/02/14 0420 04/02/14 1000  TROPONINI 0.25* 0.27* 0.28* 0.28*   BNP: BNP (last 3 results) No results for input(s): PROBNP in the last 8760 hours. CBG:  Recent Labs Lab 04/06/14 2039 04/06/14 2356 04/07/14 0433 04/07/14 0818 04/07/14 1151  GLUCAP 93 88 113* 86 111*       Signed:  Carolyne Littles, MD [facility] [phone] pager

## 2014-04-07 NOTE — Discharge Instructions (Signed)

## 2014-04-07 NOTE — Progress Notes (Signed)
Speech Language Pathology Treatment: Dysphagia  Patient Details Name: Rebecca [name] MRN: [id number] DOB: February 03, 1946 Today's Date: 04/07/2014 Time: 2800-3491 SLP Time Calculation (min) (ACUTE ONLY): 26 min  Assessment / Plan / Recommendation Clinical Impression  Pt appeared tired and distracted. Nurse revealed pt did not get any sleep last night. SLP provided visual, tactile and contextual cues during PO trials to initiate motor movements with feeding. When given nectar thick liquids presented in a cup no overt s/s of aspiration were noted. Pt refused PO trials with puree. Pt coughed immediately after upgraded trial with small sips of thin liquids. Pt was given visual cues to answer yes/no questions, to select a choice when given two options and to identify a named object. SLP attempted pt use writing to answer questions with no success.  Recommended pt continue on current dys1/nectar thick liquid diet and rehabilitation services.     HPI HPI: 69 y.o. 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 MRI Acute on chronic LEFT frontal lobe infarct (middle cerebral artery territory. Bilateral basal ganglia and thalamus lacunar infarcts. Recently hospitalized with stroke with expressive aphasia and mild weakness on the right side. Failed RN stroke swallow scren.   Pertinent Vitals Pain Assessment: Faces Faces Pain Scale: No hurt  SLP Plan  Continue with current plan of care    Recommendations Diet recommendations: Dysphagia 1 (puree);Nectar-thick liquid Liquids provided via: Cup;Straw Medication Administration: Whole meds with puree Supervision: Staff to assist with self feeding Compensations: Slow rate;Small sips/bites Postural Changes and/or  Swallow Maneuvers: Seated upright 90 degrees              Oral Care Recommendations: Oral care BID Follow up Recommendations: Inpatient Rehab Plan: Continue with current plan of care    GO     Bermudez-Bosch, Audyn Dimercurio 04/07/2014, 10:52 AM

## 2014-04-08 ENCOUNTER — Inpatient Hospital Stay (HOSPITAL_COMMUNITY): Payer: Self-pay

## 2014-04-08 ENCOUNTER — Inpatient Hospital Stay (HOSPITAL_COMMUNITY): Payer: Medicare Other | Admitting: Occupational Therapy

## 2014-04-08 ENCOUNTER — Inpatient Hospital Stay (HOSPITAL_COMMUNITY): Payer: Self-pay | Admitting: Speech Pathology

## 2014-04-08 ENCOUNTER — Encounter (HOSPITAL_COMMUNITY): Payer: Self-pay | Admitting: *Deleted

## 2014-04-08 ENCOUNTER — Encounter: Payer: Self-pay | Admitting: *Deleted

## 2014-04-08 DIAGNOSIS — I69391 Dysphagia following cerebral infarction: Secondary | ICD-10-CM

## 2014-04-08 LAB — CBC WITH DIFFERENTIAL/PLATELET
BASOS ABS: 0 10*3/uL (ref 0.0–0.1)
Basophils Relative: 0 % (ref 0–1)
Eosinophils Absolute: 0 10*3/uL (ref 0.0–0.7)
Eosinophils Relative: 0 % (ref 0–5)
HCT: 38.7 % (ref 36.0–46.0)
Hemoglobin: 12.7 g/dL (ref 12.0–15.0)
LYMPHS ABS: 1.3 10*3/uL (ref 0.7–4.0)
Lymphocytes Relative: 28 % (ref 12–46)
MCH: 27.4 pg (ref 26.0–34.0)
MCHC: 32.8 g/dL (ref 30.0–36.0)
MCV: 83.4 fL (ref 78.0–100.0)
Monocytes Absolute: 0.6 10*3/uL (ref 0.1–1.0)
Monocytes Relative: 14 % — ABNORMAL HIGH (ref 3–12)
Neutro Abs: 2.6 10*3/uL (ref 1.7–7.7)
Neutrophils Relative %: 58 % (ref 43–77)
PLATELETS: 209 10*3/uL (ref 150–400)
RBC: 4.64 MIL/uL (ref 3.87–5.11)
RDW: 15 % (ref 11.5–15.5)
WBC: 4.6 10*3/uL (ref 4.0–10.5)

## 2014-04-08 LAB — GLUCOSE, CAPILLARY
GLUCOSE-CAPILLARY: 103 mg/dL — AB (ref 70–99)
Glucose-Capillary: 100 mg/dL — ABNORMAL HIGH (ref 70–99)
Glucose-Capillary: 103 mg/dL — ABNORMAL HIGH (ref 70–99)
Glucose-Capillary: 108 mg/dL — ABNORMAL HIGH (ref 70–99)
Glucose-Capillary: 113 mg/dL — ABNORMAL HIGH (ref 70–99)
Glucose-Capillary: 116 mg/dL — ABNORMAL HIGH (ref 70–99)
Glucose-Capillary: 97 mg/dL (ref 70–99)
Glucose-Capillary: 99 mg/dL (ref 70–99)

## 2014-04-08 LAB — COMPREHENSIVE METABOLIC PANEL
ALBUMIN: 3.6 g/dL (ref 3.5–5.2)
ALT: 19 U/L (ref 0–35)
AST: 35 U/L (ref 0–37)
Alkaline Phosphatase: 37 U/L — ABNORMAL LOW (ref 39–117)
Anion gap: 13 (ref 5–15)
BUN: 26 mg/dL — ABNORMAL HIGH (ref 6–23)
CO2: 27 mmol/L (ref 19–32)
Calcium: 9.5 mg/dL (ref 8.4–10.5)
Chloride: 104 mEq/L (ref 96–112)
Creatinine, Ser: 1 mg/dL (ref 0.50–1.10)
GFR calc Af Amer: 65 mL/min — ABNORMAL LOW (ref >90)
GFR calc non Af Amer: 56 mL/min — ABNORMAL LOW (ref >90)
Glucose, Bld: 97 mg/dL (ref 70–99)
Potassium: 4.1 mmol/L (ref 3.5–5.1)
SODIUM: 144 mmol/L (ref 135–145)
TOTAL PROTEIN: 7.5 g/dL (ref 6.0–8.3)
Total Bilirubin: 0.9 mg/dL (ref 0.3–1.2)

## 2014-04-08 MED ORDER — MEGESTROL ACETATE 400 MG/10ML PO SUSP
400.0000 mg | Freq: Two times a day (BID) | ORAL | Status: DC
  Administered 2014-04-08 – 2014-05-01 (×41): 400 mg via ORAL
  Filled 2014-04-08 (×52): qty 10

## 2014-04-08 MED ORDER — SODIUM CHLORIDE 0.45 % IV SOLN
INTRAVENOUS | Status: DC
  Administered 2014-04-08 – 2014-04-10 (×2): via INTRAVENOUS

## 2014-04-08 NOTE — Evaluation (Signed)
Occupational Therapy Assessment and Plan  Patient Details  Name: Rebecca [name] MRN: [id number] Date of Birth: 08-09-1945  OT Diagnosis: apraxia, cognitive deficits, hemiplegia affecting dominant side and muscle weakness (generalized) Rehab Potential: Rehab Potential (ACUTE ONLY): Fair ELOS: 7-10 days   Today's Date: 04/08/2014 OT Individual Time: 1448-1856 OT Individual Time Calculation (min): 60 min     Problem List:  Patient Active Problem List   Diagnosis Date Noted  . Cardioembolic stroke 31/49/7026  . Protein-calorie malnutrition, severe 04/05/2014  . Global aphasia   . Paroxysmal atrial fibrillation   . Hyperlipidemia   . Acute diastolic heart failure 37/85/8850  . Elevated troponin I level   . Essential hypertension   . Acute on chronic combined systolic and diastolic CHF (congestive heart failure)   . Pulmonary hypertension   . Chronic atrial fibrillation   . Diabetes type 2, controlled   . HLD (hyperlipidemia)   . Elevated troponin 04/01/2014  . Stroke 04/01/2014  . Aphasia 04/01/2014  . 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 Atrial fibrillation 05/01/2013  . Tobacco abuse 05/01/2013  . Expressive aphasia 03/23/2013  . Diabetes mellitus 03/23/2013  . Hypertension 03/23/2013  . Decreased calculated GFR 03/23/2013    Past Medical History:  Past Medical History  Diagnosis Date  . Hypertension   . 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 Expressive aphasia 03/23/2013  . Atrial fibrillation 05/01/2013  . Tobacco abuse 05/01/2013  . Diabetes mellitus without complication   . Depression   . Stroke     2015   Past Surgical History:  Past Surgical History  Procedure Laterality Date  . Tee without cardioversion N/A 03/26/2013    Procedure: TRANSESOPHAGEAL ECHOCARDIOGRAM (TEE);  Surgeon: Sanda Klein, MD;  Location: [facility] ENDOSCOPY;  Service: Cardiovascular;  Laterality: N/A;  . Cesarean section      x3  . Hip replacement Right 1990's  . Hip replacemet Left 1990's    Assessment &  Plan Clinical Impression: Rebecca [name] is a 69 y.o. 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 Independent prior to admission living with her son. 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 Bifrontal encephalomalacia further propagation on the right new on the left consistent with interval bilateral posterior cerebral artery territory infarct. MRA with decreased flow in left MCA. Carotid Dopplers with no ICA stenosis. Echocardiogram with ejection fraction of 50% no wall motion abnormalities. EEG negative for seizure. Patient did not receive TPA. Neurology consulted placed on Eliquis alone as there was some question to her medical compliance with Xarelto prior to admission. Dysphagia one nectar thick liquid diet. Physical therapy and occupational evaluations completed with recommendations of physical medicine rehabilitation consult. Patient was admitted for comprehensive rehabilitation program.  Patient transferred to CIR on 04/07/2014 .    Patient currently requires max with basic self-care skills secondary to muscle weakness, decreased cardiorespiratoy endurance, motor apraxia and decreased coordination, ideational apraxia, decreased attention and decreased problem solving and decreased standing balance and hemiplegia.  Prior to hospitalization, patient was independent per pre admission report.  Patient will benefit from skilled intervention to increase independence with basic self-care skills prior to discharge to SNF.[name]  Anticipate patient  will require 24  hour supervision and minimal physical assistance and OT in SNF.[name]  OT - End of Session Activity Tolerance: Tolerates 10 - 20 min activity with multiple rests Endurance Deficit: Yes Endurance Deficit Description: gait quality declined after 30' OT Assessment Rehab Potential (ACUTE ONLY): Fair Barriers to Discharge: Decreased caregiver support Barriers to Discharge Comments: Uncertain at this time who pt will have as a caregiver OT Patient demonstrates impairments in the following area(s): Balance;Cognition;Endurance;Motor;Perception OT Basic ADL's Functional Problem(s): Eating;Grooming;Bathing;Dressing;Toileting OT Transfers Functional Problem(s): Toilet;Tub/Shower OT Additional Impairment(s): Fuctional Use of Upper Extremity OT Plan OT Intensity: Minimum of 1-2 x/day, 45 to 90 minutes OT Frequency: 5 out of 7 days OT Duration/Estimated Length of Stay: 7-10 days OT Treatment/Interventions: Balance/vestibular training;Cognitive remediation/compensation;Discharge planning;DME/adaptive equipment instruction;Functional mobility training;Neuromuscular re-education;Patient/family education;Self Care/advanced ADL retraining;Therapeutic Activities;Therapeutic Exercise;UE/LE Strength taining/ROM;UE/LE Coordination activities;Visual/perceptual remediation/compensation OT Self Feeding Anticipated Outcome(s): supervision OT Basic Self-Care Anticipated Outcome(s): min A OT Toileting Anticipated Outcome(s): min A OT Bathroom Transfers Anticipated Outcome(s): supervision stand pivot to toilet or tub bench OT Recommendation Patient destination: [facility] (SNF) Follow Up Recommendations: Skilled nursing facility Equipment Recommended: To be determined   Skilled Therapeutic Intervention Pt seen for initial evaluation and self care training with a focus on attention, problem solving, motor planning, balance and endurance. No family present and pt appeared somewhat anxious. Explained to pt  with slow verbal instructions, tactile cues, and visual instructions role of OT and what she would be working on. It is very unclear as to how much pt is comprehending. Pt is not able to express herself. Pt taken via w/c to toilet and pt guided to transfer to toilet. She would not respond with head nods if she needed to use bathroom. She did urinate, but could not understand when asked if she needed to sit longer. Pt needed max cues and A to thoroughly cleanse herself as she did have bowel on her body from earlier incontinence.  Pt needed total cues of visual and tactile to complete basic self care along with mod A for bathing and max to total A for dressing as pt would not initiate on her own. Pt assisted with TED hose and quick release belt applied to w/c. Pt taken to nursing station for supervision.  OT Evaluation Precautions/Restrictions  Precautions Precautions: Fall Precaution Comments: pt is unstready on her feet. pt apparently knows how to turn off bed alarm   Vital Signs Therapy Vitals Pulse Rate: 85 BP: (!) 158/100 mmHg Patient Position (if appropriate): Sitting (after gait x 40') Oxygen Therapy SpO2: 91 % O2 Device: Not Delivered Pain Pain Assessment Pain Assessment: Faces Faces Pain Scale: No hurt Pain Location: Head Patients Stated Pain Goal: 0 Pain Intervention(s): Medication (See eMAR) Home Living/Prior Functioning Home Living Available Help at Discharge: Family (other son, [name], possibly) Type of Home: House Home Layout: Two level Additional Comments: Pt with significant aphasia and unable to report hx.  No family present to report.   Lives With: Son (disabled on HD; planned to move out soon) Prior Function Level of Independence: Independent with gait (owned [facility]) Driving: Yes Vocation: Retired Leisure: Hobbies-yes (Comment) (gardening; preferred to be alon) Comments: Patient with significant aphasia and unable to report PLOF. No family present. ADL ADL ADL  Comments: Refer to FIM Vision/Perception  Vision- History Patient Visual Report: Other (comment) (difficult to assess) Vision- Assessment Additional Comments: Difficult to assess due to aphasia Perception Comments: difficult to assess due to aphasia  Cognition Overall Cognitive Status: Difficult to  assess Arousal/Alertness: Awake/alert Orientation Level:  (non verbal) Attention: Sustained Sustained Attention: Impaired Sustained Attention Impairment: Functional basic Problem Solving: Impaired Problem Solving Impairment: Functional basic Sensation Sensation Light Touch:  (difficult to assess; appears intact) Stereognosis: Not tested Hot/Cold: Not tested Proprioception: Appears Intact Coordination Gross Motor Movements are Fluid and Coordinated: No Fine Motor Movements are Fluid and Coordinated: No Coordination and Movement Description: Impaired RUE coordination in shoulder and fingers Motor  Motor Motor: Hemiplegia;Motor apraxia Motor - Skilled Clinical Observations: generalized weakness Mobility  Bed Mobility Bed Mobility: Not assessed  Trunk/Postural Assessment  Cervical Assessment Cervical Assessment: Within Functional Limits Thoracic Assessment Thoracic Assessment: Within Functional Limits Lumbar Assessment Lumbar Assessment: Within Functional Limits Postural Control Postural Control: Within Functional Limits  Balance Balance Balance Assessed: Yes Static Sitting Balance Static Sitting - Level of Assistance: 5: Stand by assistance Static Standing Balance Static Standing - Level of Assistance: 4: Min assist Dynamic Standing Balance Dynamic Standing - Balance Support: Right upper extremity supported (mod assist) Dynamic Standing - Level of Assistance: 3: Mod assist Extremity/Trunk Assessment RUE Assessment RUE Assessment: Exceptions to [facility] RUE AROM (degrees) Overall AROM Right Upper Extremity: Within functional limits for tasks performed RUE Strength RUE  Overall Strength: Deficits (3+/5) LUE Assessment LUE Assessment: Within Functional Limits  FIM:  FIM - Grooming Grooming Steps: Wash, rinse, dry face Grooming: 2: Patient completes 1 of 4 or 2 of 5 steps FIM - Bathing Bathing Steps Patient Completed: Right Arm;Abdomen;Front perineal area;Right upper leg;Left upper leg Bathing: 3: Mod-Patient completes 5-7 50f10 parts or 50-74% FIM - Upper Body Dressing/Undressing Upper body dressing/undressing steps patient completed: Thread/unthread left sleeve of pullover shirt/dress Upper body dressing/undressing: 2: Max-Patient completed 25-49% of tasks FIM - Lower Body Dressing/Undressing Lower body dressing/undressing steps patient completed: Pull pants up/down Lower body dressing/undressing: 1: Total-Patient completed less than 25% of tasks FIM - Toileting Toileting: 1: Total-Patient completed zero steps, helper did all 3 FIM - BControl and instrumentation engineerDevices: Arm rests Bed/Chair Transfer: 4: Bed > Chair or W/C: Min A (steadying Pt. > 75%) (upright recliner> w/c) FIM - TRadio producerDevices: Grab bars Toilet Transfers: 4-To toilet/BSC: Min A (steadying Pt. > 75%);4-From toilet/BSC: Min A (steadying Pt. > 75%) FIM - Tub/Shower Transfers Tub/shower Transfers: 0-Activity did not occur or was simulated   Refer to Care Plan for Long Term Goals  Recommendations for other services: None  Discharge Criteria: Patient will be discharged from OT if patient refuses treatment 3 consecutive times without medical reason, if treatment goals not met, if there is a change in medical status, if patient makes no progress towards goals or if patient is discharged from hospital.  The above assessment, treatment plan, treatment alternatives and goals were discussed and mutually agreed upon: No family available/patient unable  [facility]1/20/2016, 11:38 AM

## 2014-04-08 NOTE — Evaluation (Signed)
Speech Language Pathology Assessment and Plan  Patient Details  Name: BONNY VANLEEUWEN MRN: [id number] Date of Birth: 1946/01/09  SLP Diagnosis: Apraxia;Cognitive Impairments;Dysphagia;Aphasia  Rehab Potential: Poor ELOS: 7-10 days     Today's Date: 04/08/2014 SLP Individual Time: 1300-1405 SLP Individual Time Calculation (min): 65 min   Problem List:  Patient Active Problem List   Diagnosis Date Noted  . Cardioembolic stroke 87/68/1157  . Protein-calorie malnutrition, severe 04/05/2014  . Global aphasia   . Paroxysmal atrial fibrillation   . Hyperlipidemia   . Acute diastolic heart failure 26/20/3559  . Elevated troponin I level   . Essential hypertension   . Acute on chronic combined systolic and diastolic CHF (congestive heart failure)   . Pulmonary hypertension   . Chronic atrial fibrillation   . Diabetes type 2, controlled   . HLD (hyperlipidemia)   . Elevated troponin 04/01/2014  . Stroke 04/01/2014  . Aphasia 04/01/2014  . 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 Atrial fibrillation 05/01/2013  . Tobacco abuse 05/01/2013  . Expressive aphasia 03/23/2013  . Diabetes mellitus 03/23/2013  . Hypertension 03/23/2013  . Decreased calculated GFR 03/23/2013   Past Medical History:  Past Medical History  Diagnosis Date  . Hypertension   . 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 Expressive aphasia 03/23/2013  . Atrial fibrillation 05/01/2013  . Tobacco abuse 05/01/2013  . Diabetes mellitus without complication   . Depression   . Stroke     2015   Past Surgical History:  Past Surgical History  Procedure Laterality Date  . Tee without cardioversion N/A 03/26/2013    Procedure: TRANSESOPHAGEAL ECHOCARDIOGRAM (TEE);  Surgeon: Sanda Klein, MD;  Location: [facility] ENDOSCOPY;  Service: Cardiovascular;  Laterality: N/A;  . Cesarean section      x3  . Hip replacement Right 1990's  . Hip replacemet Left 1990's    Assessment / Plan / Recommendation Clinical Impression   SHERILYNN DIEU is a  69 y.o. right handed female with history of Fabrys disease, diastolic congestive heart failure, hypertension, atrial fibrillation maintained on Xarelto and low-dose aspirin with question of medical compliance, tobacco abuse, diabetes mellitus and peripheral neuropathy post CVA January 2015 and residual expressive aphasia. Independent prior to admission living with her son. Presented 04/01/2014 with worsening aphasia and right-sided weakness as well as respiratory compromise and placed on BiPAP. MRI of the brain shows acute on chronic left frontal lobe middle cerebral artery territory infarct. Bifrontal encephalomalacia further propagation on the right new on the left consistent with interval bilateral posterior cerebral artery territory infarct.  Dysphagia one nectar thick liquid diet. Physical therapy and occupational evaluations completed with recommendations of physical medicine rehabilitation consult. Patient was admitted for comprehensive rehabilitation program Pt presents with s/s of a severe oralpharyngeal dysphagia with motor planning and cognitive components.  Pt required max cuing to initiate a swallow response with both dys 1 solids and nectar thick liquids.  Pt presents with right labial, lingual, and buccal weakness and impaired oral motor coordination with poor management of oral secretions and right anterior loss of saliva and PO due to decreased labial seal.  Pt also exhibits poor awareness of boluses with limited attempts to manipulate or posteriorly transit materials which SLP suspects is related to both decreased focused attention, decreased alertness, and motor planning impairments.  Pt also requires constant cuing or hand over hand assist for self feeding due to perseveration and poor initiation.  Anticipate that pt will have difficulty meeting nutritional needs orally and may benefit from alternative means  of nutrition.  P.A.  Made aware of SLP's concerns.   Additionally, pt presents with  severe global aphasia with apraxia and is nonverbal at this time.  Pt also presents with poor arousal during functional tasks and decreased focused attention which impact all higher level cognitive processes such as memory, awareness, and problem solving.  Pt is able to intermittently follow 1-step commands with max to total assist multimodal cuing as well as auditory and tactile stimulation.   Pt would benefit from skilled ST while inpatient in order to maximize functional independence and reduce burden of care upon discharge.  Anticipate that pt will require long term 24/7 supervision, assistance for medication and financial management, and SLP follow up at next level of care.  Unsure of assistance available at discharge, so pt may need SNF placement at discharge.    Skilled Therapeutic Interventions          Cognitive-linguistic and bedside swallow evaluation completed.  See note above.     SLP Assessment  Patient will need skilled Speech Lanaguage Pathology Services during CIR admission    Recommendations  Diet Recommendations: Dysphagia 1 (Puree);Nectar-thick liquid Liquid Administration via: Cup Medication Administration: Whole meds with puree Supervision: Staff to assist with self feeding;Full supervision/cueing for compensatory strategies Compensations: Slow rate;Small sips/bites Postural Changes and/or Swallow Maneuvers: Seated upright 90 degrees Oral Care Recommendations: Oral care BID Patient destination:  (TBD) Follow up Recommendations: 24 hour supervision/assistance;Skilled Nursing facility Equipment Recommended: None recommended by SLP    SLP Frequency 5 out of 7 days   SLP Treatment/Interventions Cognitive remediation/compensation;Cueing hierarchy;Dysphagia/aspiration precaution training;Functional tasks;Patient/family education;Oral motor exercises;Multimodal communication approach;Internal/external aids;Environmental controls;Speech/Language facilitation    Pain Pain  Assessment Pain Assessment: Faces Faces Pain Scale: No hurt Prior Functioning Cognitive/Linguistic Baseline: Baseline deficits (global aphasia since 03/24/13) Type of Home: House  Lives With: Son Available Help at Discharge: Family Vocation: Retired  Industrial/product designer Term Goals: Week 1: SLP Short Term Goal 1 (Week 1): Pt will initiate a timely swallow response over 50% of observable opportunities during presentations of her currently prescribed diet with max assist multimodal cuing.   SLP Short Term Goal 2 (Week 1): Pt will focus attention to verbal/auditory stimulation during structured therapeutic tasks for 15-30 seconds with max assist.   SLP Short Term Goal 3 (Week 1): Pt will follow 1 step commands for 25-50% accuracy with max assist multimodal cuing.  SLP Short Term Goal 4 (Week 1): Pt will improve functional communication via any modality (i.e. gestures, pointing, facial expressions, vocalization) during structured tasks with max assist multimodal cuing.   See FIM for current functional status Refer to Care Plan for Long Term Goals  Recommendations for other services: None  Discharge Criteria: Patient will be discharged from SLP if patient refuses treatment 3 consecutive times without medical reason, if treatment goals not met, if there is a change in medical status, if patient makes no progress towards goals or if patient is discharged from hospital.  The above assessment, treatment plan, treatment alternatives and goals were discussed and mutually agreed upon: No family available/patient unable  Emilio Math 04/08/2014, 4:35 PM

## 2014-04-08 NOTE — Discharge Instructions (Signed)
Information on my medicine - ELIQUIS® (apixaban) ° °This medication education was reviewed with me or my healthcare representative as part of my discharge preparation.  ° °Why was Eliquis® prescribed for you? °Eliquis® was prescribed for you to reduce the risk of a blood clot forming that can cause a stroke if you have a medical condition called atrial fibrillation (a type of irregular heartbeat). ° °What do You need to know about Eliquis® ? °Take your Eliquis® 5mg TWICE DAILY - one tablet in the morning and one tablet in the evening with or without food. If you have difficulty swallowing the tablet whole please discuss with your pharmacist how to take the medication safely. ° °Take Eliquis® exactly as prescribed by your doctor and DO NOT stop taking Eliquis® without talking to the doctor who prescribed the medication.  Stopping may increase your risk of developing a stroke.  Refill your prescription before you run out. ° °After discharge, you should have regular check-up appointments with your healthcare provider that is prescribing your Eliquis®.  In the future your dose may need to be changed if your kidney function or weight changes by a significant amount or as you get older. ° °What do you do if you miss a dose? °If you miss a dose, take it as soon as you remember on the same day and resume taking twice daily.  Do not take more than one dose of ELIQUIS at the same time to make up a missed dose. ° °Important Safety Information °A possible side effect of Eliquis® is bleeding. You should call your healthcare provider right away if you experience any of the following: °? Bleeding from an injury or your nose that does not stop. °? Unusual colored urine (red or dark brown) or unusual colored stools (red or black). °? Unusual bruising for unknown reasons. °? A serious fall or if you hit your head (even if there is no bleeding). ° °Some medicines may interact with Eliquis® and might increase your risk of bleeding or  clotting while on Eliquis®. To help avoid this, consult your healthcare provider or pharmacist prior to using any new prescription or non-prescription medications, including herbals, vitamins, non-steroidal anti-inflammatory drugs (NSAIDs) and supplements. ° °This website has more information on Eliquis® (apixaban): http://www.eliquis.com/eliquis/home ° °

## 2014-04-08 NOTE — Progress Notes (Signed)
Patient information reviewed and entered into eRehab system by Tora Duck, RN, CRRN, PPS Coordinator.  Information including medical coding and functional independence measure will be reviewed and updated through discharge.     Per nursing patient was given "Data Collection Information Summary for Patients in Inpatient Rehabilitation Facilities with attached "Privacy Act Statement-Health Care Records" upon admission but unable to review until family is present.  Handout is at bedside.

## 2014-04-08 NOTE — Evaluation (Addendum)
Physical Therapy Assessment and Plan  Patient Details  Name: VARA MAIRENA MRN: [id number] Date of Birth: 07-Jun-1945  PT Diagnosis: Abnormality of gait, Cognitive deficits, Hemiparesis dominant and Muscle weakness Rehab Potential: Good ELOS: 7-10   Today's Date: 04/08/2014 PT Individual Time: 0800-0900 PT Individual Time Calculation (min): 60 min    Problem List:  Patient Active Problem List   Diagnosis Date Noted  . Cardioembolic stroke 75/12/2583  . Protein-calorie malnutrition, severe 04/05/2014  . Global aphasia   . Paroxysmal atrial fibrillation   . Hyperlipidemia   . Acute diastolic heart failure 27/78/2423  . Elevated troponin I level   . Essential hypertension   . Acute on chronic combined systolic and diastolic CHF (congestive heart failure)   . Pulmonary hypertension   . Chronic atrial fibrillation   . Diabetes type 2, controlled   . HLD (hyperlipidemia)   . Elevated troponin 04/01/2014  . Stroke 04/01/2014  . Aphasia 04/01/2014  . 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 Atrial fibrillation 05/01/2013  . Tobacco abuse 05/01/2013  . Expressive aphasia 03/23/2013  . Diabetes mellitus 03/23/2013  . Hypertension 03/23/2013  . Decreased calculated GFR 03/23/2013    Past Medical History:  Past Medical History  Diagnosis Date  . Hypertension   . 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 Expressive aphasia 03/23/2013  . Atrial fibrillation 05/01/2013  . Tobacco abuse 05/01/2013  . Diabetes mellitus without complication   . Depression   . Stroke     2015   Past Surgical History:  Past Surgical History  Procedure Laterality Date  . Tee without cardioversion N/A 03/26/2013    Procedure: TRANSESOPHAGEAL ECHOCARDIOGRAM (TEE);  Surgeon: Sanda Klein, MD;  Location: [facility] ENDOSCOPY;  Service: Cardiovascular;  Laterality: N/A;  . Cesarean section      x3  . Hip replacement Right 1990's  . Hip replacemet Left 1990's    Assessment & Plan Clinical Impression: RANELL FINELLI is a 69  y.o. right handed female with history of Fabrys disease, diastolic congestive heart failure, hypertension, atrial fibrillation maintained on Xarelto and low-dose aspirin with question of medical compliance, tobacco abuse, diabetes mellitus and peripheral neuropathy past CVA January 2015 and residual expressive aphasia. Independent prior to admission living with her son. 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 Bifrontal encephalomalacia further propagation on the right new on the left consistent with interval bilateral posterior cerebral artery territory infarct. MRA with decreased flow in left MCA. Carotid Dopplers with no ICA stenosis. Echocardiogram with ejection fraction of 50% no wall motion abnormalities.  Patient transferred to CIR on 04/07/2014 .   Patient currently requires mod with mobility secondary to decreased cardiorespiratoy endurance and unbalanced muscle activation.  Prior to hospitalization, patient was modified independent  with mobility and lived with Son (disabled on HD; planned to move out soon) in a House home.  Home access is unknown.   .  Patient will benefit from skilled PT intervention to maximize safe functional mobility, minimize fall risk and decrease caregiver burden for planned discharge home with 24 hour supervision.  Anticipate patient will benefit from follow up [place] at discharge.  PT - End of Session Activity Tolerance: Tolerates < 10 min activity with changes in vital signs Endurance Deficit: Yes Endurance Deficit Description: gait quality declined after 30' PT Assessment Rehab Potential (ACUTE/IP ONLY): Good Barriers to Discharge: Decreased caregiver support  Barriers to Discharge Comments: unsure of home environment PT Patient demonstrates impairments in the following area(s):  Balance;Edema;Endurance;Motor;Safety;Sensory PT Transfers Functional Problem(s): Bed Mobility;Bed to Chair;Car;Furniture;Floor PT Locomotion Functional Problem(s): Ambulation;Wheelchair Mobility;Stairs PT Plan PT Intensity: Minimum of 1-2 x/day ,45 to 90 minutes PT Frequency: 5 out of 7 days PT Duration Estimated Length of Stay: 7-10 PT Treatment/Interventions: Ambulation/gait training;Balance/vestibular training;Cognitive remediation/compensation;Discharge planning;DME/adaptive equipment instruction;Functional electrical stimulation;Functional mobility training;Patient/family education;Neuromuscular re-education;Psychosocial support;Splinting/orthotics;Therapeutic Exercise;Therapeutic Activities;Stair training;UE/LE Strength taining/ROM;UE/LE Coordination activities;Wheelchair propulsion/positioning PT Transfers Anticipated Outcome(s): supervision for basic and car PT Locomotion Anticipated Outcome(s): supervision x 150' gait and up/down 5 steps 2 rails PT Recommendation Follow Up Recommendations: Home health PT Patient destination:  (unknown at admission) Equipment Recommended: To be determined  Skilled Therapeutic Intervention- pt compliant and cooperative during eval, but non-verbal.  She did smile several times when negotiating stairs.  Pt required HTN meds during tx.  PT assessed pt's swallowing of pills, nectar and applesauce, which was extremely slow and required max cueing. Pt placed in 16x16 w/c, and bil legrests adjusted for pt's height and frame. At end of session, pt's quick release belt was applied in w/c and pt placed at nurse's station until next therapy session. PT Evaluation Precautions/Restrictions Precautions Precautions: Fall Precaution Comments: pt is unstready on her feet. pt apparently knows how to turn off bed alarm General   Vital SignsTherapy Vitals Pulse Rate: 85 Resp: 18 BP: (!) 158/100 mmHg; RN informed and pt took meds during PT eval Patient Position (if  appropriate): Sitting (after gait x 40') Oxygen Therapy SpO2: 91 % O2 Device: Not Delivered Pain Pain Assessment Pain Assessment:  (difficult to assess; pt gripped forehand a couple of times, ? HA; RN informed) Home Living/Prior Functioning Home Living Available Help at Discharge: Family (other son, [name], possibly) Type of Home: House Home Layout: Two level Additional Comments: Pt with significant aphasia and unable to report hx.  No family present to report.   Lives With: Son (disabled on HD; planned to move out soon) Prior Function Level of Independence: Independent with gait (owned [facility]) Driving: Yes Vocation: Retired Leisure: Hobbies-yes (Comment) (gardening; preferred to be alon) Comments: Patient with significant aphasia and unable to report PLOF. No family present. Vision/Perception - difficult to assess; some R inattention noted    Cognition Overall Cognitive Status: Difficult to assess Arousal/Alertness: Awake/alert Attention: Sustained Sensation Sensation Light Touch:  (difficult to assess; appears intact) Proprioception: Appears Intact Motor  Motor Motor - Skilled Clinical Observations: generalized weakness  Mobility Bed Mobility Bed Mobility: Not assessed Transfers Transfers: Yes Stand Pivot Transfers: 4: Min assist Stand Pivot Transfer Details: Visual cues/gestures for sequencing;Manual facilitation for weight shifting Locomotion  Ambulation Ambulation: Yes Ambulation/Gait Assistance: 3: Mod assist Ambulation Distance (Feet): 40 Feet Assistive device: None Ambulation/Gait Assistance Details: Manual facilitation for weight shifting Gait Gait: Yes Gait Pattern: Impaired Gait Pattern: Step-through pattern;Decreased step length - right;Decreased step length - left;Decreased hip/knee flexion - left;Decreased hip/knee flexion - right;Decreased weight shift to right;Narrow base of support (bil hip IR, R>L; significant pes planus bil) Gait velocity:  decreased Stairs / Additional Locomotion Stairs: Yes Stairs Assistance: 3: Mod assist Stairs Assistance Details: Manual facilitation for weight bearing Stairs Assistance Details (indicate cue type and reason): mod assist as she descended step with self selected LLE Stair Management Technique: Two rails;Step to pattern Number of Stairs: 5 Height of Stairs: 7 Wheelchair Mobility Wheelchair Mobility: Yes Wheelchair Assistance: 4: Energy manager: Both upper extremities Wheelchair Parts Management: Needs assistance Distance: 15  Trunk/Postural Assessment  Cervical Assessment Cervical Assessment: Within Functional Limits Thoracic Assessment Thoracic Assessment: Within Functional Limits Lumbar Assessment Lumbar Assessment: Within Functional Limits Postural Control Postural Control: Within Functional Limits  Balance Balance Balance Assessed: Yes Static Sitting Balance Static Sitting - Level of Assistance: 5: Stand by assistance Static Standing Balance Static Standing - Level of Assistance: 4: Min assist Dynamic Standing Balance Dynamic Standing - Balance Support: Right upper extremity supported (mod assist) Extremity Assessment      RLE Assessment RLE Assessment: Exceptions to [facility] (unable to formally assess strength; min non pitting edema) RLE Strength RLE Overall Strength Comments: R hip mildly weak for abduction and ER during gait LLE Assessment LLE Assessment: Within Functional Limits (mild non pitting edema)  FIM:  FIM - Locomotion: Wheelchair Distance: 15 FIM - Locomotion: Ambulation Ambulation/Gait Assistance: 3: Mod assist   Refer to Care Plan for Long Term Goals  Recommendations for other services: None  Discharge Criteria: Patient will be discharged from PT if patient refuses treatment 3 consecutive times without medical reason, if treatment goals not met, if there is a change in medical status, if patient makes no progress towards goals or if  patient is discharged from hospital.  The above assessment, treatment plan, treatment alternatives and goals were discussed and mutually agreed upon: No family available/patient unable  Aylah Yeary 04/08/2014, 9:43 AM

## 2014-04-08 NOTE — Progress Notes (Signed)
69 y.o. 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 Independent prior to admission living with her son. 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 Bifrontal encephalomalacia further propagation on the right new on the left consistent with interval bilateral posterior cerebral artery territory infarct. MRA with decreased flow in left MCA. Carotid Dopplers with no ICA stenosis. Echocardiogram with ejection fraction of 50% no wall motion abnormalities. EEG negative for seizure. Patient did not receive TPA. Neurology consulted placed on Eliquis alone as there was some question to her medical compliance with Xarelto prior to admission.   Subjective/Complaints: Patient with limited ability to follow commands. Has not eaten anything today. No IV access currently.  Objective: Vital Signs: Blood pressure 162/92, pulse 66, temperature 98.5 F (36.9 C), temperature source Oral, resp. rate 18, SpO2 100 %. Dg Swallowing Func-speech Pathology  04/06/2014   Riley Nearing Deblois, CCC-SLP     04/06/2014  3:03 PM  Objective Swallowing Evaluation: Modified Barium Swallowing Study   Patient Details  Name: Rebecca [name] MRN: [id number] Date of Birth: 08/03/1945  Today's Date: 04/06/2014 Time: SLP Start Time (ACUTE ONLY): 1330-SLP Stop Time (ACUTE  ONLY): 1400 SLP Time Calculation (min) (ACUTE ONLY): 30 min  Past Medical History:  Past Medical History  Diagnosis Date  . Hypertension   . 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 Expressive aphasia 03/23/2013  . Atrial fibrillation 05/01/2013   . Tobacco abuse 05/01/2013  . Diabetes mellitus without complication   . Depression   . Stroke     2015   Past Surgical History:  Past Surgical History  Procedure Laterality Date  . Tee without cardioversion N/A 03/26/2013    Procedure: TRANSESOPHAGEAL ECHOCARDIOGRAM (TEE);  Surgeon:  Thurmon Fair, MD;  Location: [facility] ENDOSCOPY;  Service:  Cardiovascular;  Laterality: N/A;  . Cesarean section      x3  . Hip replacement Right 1990's  . Hip replacemet Left 1990's   HPI:  HPI: 69 y.o. female with a past medical history of hypertension,  atrial fibrillation, Fabry disease (Fabry disease causes a  buildup of fatty material in the autonomic nervous system, eyes,  kidneys, and cardiovascular system) , hx of tobacco abuse,  diabetes mellitus, stroke, depression, who presents with aphasia.  MRI Acute on chronic LEFT frontal lobe infarct (middle cerebral  artery territory. Bilateral basal ganglia and thalamus lacunar  infarcts. Recently hospitalized with stroke with expressive  aphasia and mild weakness on the right side. Failed RN stroke  swallow scren.  No Data Recorded  Assessment / Plan / Recommendation CHL IP CLINICAL IMPRESSIONS 04/06/2014  Dysphagia Diagnosis Moderate pharyngeal phase dysphagia;Moderate  oral phase dysphagia  Clinical impression Pt demonstrates a primary oral phase  dysphagia with reduced ability to initiate oral movements for PO  intake. Pt requires moderate assist for cup to mouth and  appropriate head posture (tilts head back). There is right sided  oral weakness with moderate residuals remaining post swallow.  Moderate sensorimotor oropharyngeal impairment is characterized  by significantly delayed swallow response and pt silently  aspirates thin liquids before the swallow (trace). Suspect larger  amount of aspirate would elicit strong cough as seen at bedside.  Pt tolerates nectar thick liquids and purees without penetration  or aspiration though bolus size does need to be supervised to  decrease  risk. Pt should also be cued to attempt transit of oral  residuals with second swallow, or suction to right side should be  applied intermittently during meals. Recommend initiating a Dys 1  (puree) diet with nectar thick liquids with full supervision.       CHL IP TREATMENT RECOMMENDATION 04/06/2014  Treatment Plan Recommendations Therapy as outlined in treatment  plan below     CHL IP DIET RECOMMENDATION 04/06/2014  Diet Recommendations Dysphagia 1 (Puree);Nectar-thick liquid  Liquid Administration via Cup Medication Administration Whole  meds with puree  Compensations Slow rate;Small sips/bites;Check for  pocketing;Check for anterior loss;Multiple dry swallows after  each bite/sip  Postural Changes and/or Swallow Maneuvers Seated upright 90  degrees;Out of bed for meals     CHL IP OTHER RECOMMENDATIONS 04/06/2014  Recommended Consults (None)  Oral Care Recommendations Oral care BID  Other Recommendations Order thickener from pharmacy;Have oral  suction available     CHL IP FOLLOW UP RECOMMENDATIONS 04/06/2014  Follow up Recommendations Inpatient Rehab     CHL IP FREQUENCY AND DURATION 04/06/2014  Speech Therapy Frequency (ACUTE ONLY) min 2x/week  Treatment Duration 2 weeks     Pertinent Vitals/Pain NA    SLP Swallow Goals No flowsheet data found.  No flowsheet data found.    CHL IP REASON FOR REFERRAL 04/06/2014  Reason for Referral Objectively evaluate swallowing function     CHL IP ORAL PHASE 04/06/2014  Lips (None)  Tongue (None)  Mucous membranes (None)  Nutritional status (None)  Other (None)  Oxygen therapy (None)  Oral Phase Impaired  Oral - Pudding Teaspoon (None)  Oral - Pudding Cup (None)  Oral - Honey Teaspoon (None)  Oral - Honey Cup (None)  Oral - Honey Syringe (None)  Oral - Nectar Teaspoon (None)  Oral - Nectar Cup Right anterior bolus loss;Weak lingual  manipulation;Lingual pumping;Reduced posterior propulsion;Right  pocketing in lateral sulci;Lingual/palatal residue;Delayed oral  transit  Oral - Nectar  Straw Other (Comment)  Oral - Nectar Syringe (None)  Oral - Ice Chips (None)  Oral - Thin Teaspoon (None)  Oral - Thin Cup Right anterior bolus loss;Weak lingual  manipulation;Lingual pumping;Reduced posterior propulsion;Right  pocketing in lateral sulci;Lingual/palatal residue;Delayed oral  transit  Oral - Thin Straw (None)  Oral - Thin Syringe (None)  Oral - Puree Right anterior bolus loss;Weak lingual  manipulation;Lingual pumping;Reduced posterior propulsion;Right  pocketing in lateral sulci;Delayed oral transit  Oral - Mechanical Soft (None)  Oral - Regular Right anterior bolus loss;Weak lingual  manipulation;Lingual pumping;Reduced posterior propulsion;Right  pocketing in lateral sulci;Lingual/palatal residue;Delayed oral  transit;Impaired mastication  Oral - Multi-consistency (None)  Oral - Pill Weak lingual manipulation;Lingual pumping;Reduced  posterior propulsion;Right pocketing in lateral sulci;Delayed  oral transit  Oral Phase - Comment (None)      CHL IP PHARYNGEAL PHASE 04/06/2014  Pharyngeal Phase Impaired  Pharyngeal - Pudding Teaspoon (None)  Penetration/Aspiration details (pudding teaspoon) (None)  Pharyngeal - Pudding Cup (None)  Penetration/Aspiration details (pudding cup) (None)  Pharyngeal - Honey Teaspoon (None)  Penetration/Aspiration details (honey teaspoon) (None)  Pharyngeal - Honey Cup (None)  Penetration/Aspiration details (honey cup) (None)  Pharyngeal - Honey Syringe (None)  Penetration/Aspiration details (honey syringe) (None)  Pharyngeal - Nectar Teaspoon (None)  Penetration/Aspiration details (nectar teaspoon) (None)  Pharyngeal - Nectar Cup Delayed swallow initiation;Pharyngeal  residue - valleculae  Penetration/Aspiration details (nectar cup) Material does  not  enter airway  Pharyngeal - Nectar Straw (None)  Penetration/Aspiration details (nectar straw) (None)  Pharyngeal - Nectar Syringe (None)  Penetration/Aspiration details (nectar syringe) (None)  Pharyngeal - Ice Chips (None)   Penetration/Aspiration details (ice chips) (None)  Pharyngeal - Thin Teaspoon (None)  Penetration/Aspiration details (thin teaspoon) (None)  Pharyngeal - Thin Cup Delayed swallow  initiation;Penetration/Aspiration before swallow;Trace  aspiration;Pharyngeal residue - valleculae  Penetration/Aspiration details (thin cup) Material enters airway,  passes BELOW cords without attempt by patient to eject out  (silent aspiration)  Pharyngeal - Thin Straw (None)  Penetration/Aspiration details (thin straw) (None)  Pharyngeal - Thin Syringe (None)  Penetration/Aspiration details (thin syringe') (None)  Pharyngeal - Puree Delayed swallow initiation  Penetration/Aspiration details (puree) (None)  Pharyngeal - Mechanical Soft (None)  Penetration/Aspiration details (mechanical soft) (None)  Pharyngeal - Regular Delayed swallow initiation  Penetration/Aspiration details (regular) (None)  Pharyngeal - Multi-consistency (None)  Penetration/Aspiration details (multi-consistency) (None)  Pharyngeal - Pill Delayed swallow initiation  Penetration/Aspiration details (pill) (None)  Pharyngeal Comment (None)     No flowsheet data found.  No flowsheet data found.        [name], MA CCC-SLP [phone]  DeBlois, Riley Nearing 04/06/2014, 3:01 PM    Results for orders placed or performed during the hospital encounter of 04/07/14 (from the past 72 hour(s))  Glucose, capillary     Status: Abnormal   Collection Time: 04/07/14  8:01 PM  Result Value Ref Range   Glucose-Capillary 100 (H) 70 - 99 mg/dL  Glucose, capillary     Status: Abnormal   Collection Time: 04/08/14 12:04 AM  Result Value Ref Range   Glucose-Capillary 113 (H) 70 - 99 mg/dL  Glucose, capillary     Status: None   Collection Time: 04/08/14  3:50 AM  Result Value Ref Range   Glucose-Capillary 99 70 - 99 mg/dL  CBC WITH DIFFERENTIAL     Status: Abnormal   Collection Time: 04/08/14  7:30 AM  Result Value Ref Range   WBC 4.6 4.0 - 10.5 K/uL   RBC 4.64 3.87 -  5.11 MIL/uL   Hemoglobin 12.7 12.0 - 15.0 g/dL   HCT 82.5 00.3 - 70.4 %   MCV 83.4 78.0 - 100.0 fL   MCH 27.4 26.0 - 34.0 pg   MCHC 32.8 30.0 - 36.0 g/dL   RDW 88.8 91.6 - 94.5 %   Platelets 209 150 - 400 K/uL   Neutrophils Relative % 58 43 - 77 %   Neutro Abs 2.6 1.7 - 7.7 K/uL   Lymphocytes Relative 28 12 - 46 %   Lymphs Abs 1.3 0.7 - 4.0 K/uL   Monocytes Relative 14 (H) 3 - 12 %   Monocytes Absolute 0.6 0.1 - 1.0 K/uL   Eosinophils Relative 0 0 - 5 %   Eosinophils Absolute 0.0 0.0 - 0.7 K/uL   Basophils Relative 0 0 - 1 %   Basophils Absolute 0.0 0.0 - 0.1 K/uL  Glucose, capillary     Status: Abnormal   Collection Time: 04/08/14  7:35 AM  Result Value Ref Range   Glucose-Capillary 116 (H) 70 - 99 mg/dL     HEENT: normal Cardio: RRR and no murmurs Resp: CTA B/L and unlabored GI: BS positive and nontender nondistended Extremity:  Pulses positive and No Edema Skin:   Intact Neuro: Alert/Oriented, Abnormal Sensory unable to assess secondary to aphasia and Abnormal Motor 4/5 bilateral upper and lower extremities Musc/Skel:  Other no pain with upper or lower  extremity active range of motion, no joint swelling noted Gen. no acute distress   Assessment/Plan: 1. 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 Physiatrist is providing close team supervision and 24 hour management of active medical problems listed below. Physiatrist and rehab team continue to assess barriers to discharge/monitor patient progress toward functional and medical goals. FIM:                   Comprehension Comprehension Mode: Auditory Comprehension: 5-Understands basic 90% of the time/requires cueing < 10% of the time  Expression Expression Mode: Nonverbal  Social Interaction Social Interaction Mode: Asleep Social Interaction: 3-Interacts  appropriately 50 - 74% of the time - May be physically or verbally inappropriate.  Problem Solving Problem Solving Mode: Asleep Problem Solving: 2-Solves basic 25 - 49% of the time - needs direction more than half the time to initiate, plan or complete simple activities  Memory Memory Mode: Not assessed (non verbal- UTA )  Medical Problem List and Plan: 1. 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  DVT Prophylaxis/Anticoagulation: Eliquis. Monitor for any bleeding episodes 3. Pain Management: Tylenol as needed   4. Dysphagia.[name] Dysphagia 1 nectar liquids. Follow-up speech therapy 5. Neuropsych: This patient is not capable of making decisions on her own behalf. 6. Skin/Wound Care: Routine skin checks 7. Fluids/Electrolytes/Nutrition: Strict I and O follow-up chemistries 8. Atrial fibrillation/hypertension. Presently on Eliquis and low-dose Lopressor. Follow-up cardiology services. Cardiac rate control.(Patient on lisinopril 5 mg daily and hydrochlorothiazide 25 mg daily prior to admission and will resume as tolerated) 9. Mood/depression. Lexapro 5 mg daily. Provide emotional support 10. Hyperlipidemia. Lipitor 11. Diabetes mellitus with peripheral neuropathy. Hemoglobin A1c 6.0. Check blood sugars before meals and at bedtime 12. Acute on chronic diastolic congestive heart failure. Monitor for any signs of fluid overload 13. Tobacco abuse. Counseling  LOS (Days) 1 A FACE TO FACE EVALUATION WAS PERFORMED  Madelynn Malson E 04/08/2014, 8:31 AM

## 2014-04-08 NOTE — Patient Care Conference (Signed)
Inpatient RehabilitationTeam Conference and Plan of Care Update Date: 04/08/2014   Time: 10:40 AM    Patient Name: Rebecca [name]      Medical Record Number: [id number]  Date of Birth: [birth year] Sex: Female         Room/Bed: 4W23C/4W23C-01 Payor Info: Payor: MEDICARE / Plan: MEDICARE PART A AND B / Product Type: *No Product type* /    Admitting Diagnosis: L FRONTAL CVA  Admit Date/Time:  04/07/2014  4:53 PM Admission Comments: No comment available   Primary Diagnosis:  Cardioembolic stroke Principal Problem: Cardioembolic stroke  Patient Active Problem List   Diagnosis Date Noted  . 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 Cardioembolic stroke 04/07/2014  . Protein-calorie malnutrition, severe 04/05/2014  . Global aphasia   . Paroxysmal atrial fibrillation   . Hyperlipidemia   . Acute diastolic heart failure 04/02/2014  . Elevated troponin I level   . Essential hypertension   . Acute on chronic combined systolic and diastolic CHF (congestive heart failure)   . Pulmonary hypertension   . Chronic atrial fibrillation   . Diabetes type 2, controlled   . HLD (hyperlipidemia)   . Elevated troponin 04/01/2014  . Stroke 04/01/2014  . Aphasia 04/01/2014  . 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 Atrial fibrillation 05/01/2013  . Tobacco abuse 05/01/2013  . Expressive aphasia 03/23/2013  . Diabetes mellitus 03/23/2013  . Hypertension 03/23/2013  . Decreased calculated GFR 03/23/2013    Expected Discharge Date:  7 to 10 days => SNF?  Team Members Present: Physician leading conference: Dr. Claudette Laws Social Worker Present: Staci Acosta, LCSW Nurse Present: Carmie End, RN PT Present: Wanda Plump, PT;Blair Hobble, PT OT Present: Rosalio Loud, Kirkland Hun, OT SLP Present: Jackalyn Lombard, SLP PPS Coordinator present : Tora Duck, RN, CRRN     Current Status/Progress Goal Weekly Team Focus  Medical   severe apraxia, poor intake  adequate intake  med  management, IVF   Bowel/Bladder   Incontinent of bowel and bladder LBM 1/20  Timed toileting q 3-4 hrs  Assist with cont.   Swallow/Nutrition/ Hydration   pending ST eval         ADL's   mod A bathing, max-total A with dressing and toileting, min A standpivot toilet transfers, apraxia, global aphasia  supervision standing balance and toilet transfers; min A bathing, dressing and toileting  ADL retraining, functional mobility, balance, RUE functional use, communication adaptations, family ed   Mobility   transfers Min A; w/c self-prop 15' Mod A; gait Mod A x40' no AD; 5 stairs Mod A   supervision basic and car transfers, gait x 150' and up/down 5 steps 2 rails  neuro re-ed; balance; gait training; ther ex; ther activity; communication   Communication   pending ST eval          Safety/Cognition/ Behavioral Observations  pending ST eval          Pain   no s/s of pain  assess face for discomfort/pain and medicate appropriately  assess pain level and effective of medication   Skin   no skin breakdown noted  assess skin q shift  assist to keep skin clean and dry    Rehab Goals Patient on target to meet rehab goals: Yes Rehab Goals Revised: None - pt is just being evaluated today *See Care Plan and progress notes for long and short-term goals.  Barriers to Discharge: severe cognitive deficits    Possible Resolutions to Barriers:  see above  Discharge Planning/Teaching Needs:  CSW will meet with son [name] to discuss the d/c plan for pt.  Team questions the need for pt to go to SNF after CIR.  Family education is available to pt's family should they plan to take pt home at d/c.   Team Discussion:  MD stated that pt has pretty good strength, but her expressive and receptive aphasia is very limiting to her.  She is not able to do oral care and is not eating well.  This is due to cognitive deficits, most likely.  Pt needs to have medications crushed for her.  Nursing to try timed  toileting, since she cannot communicate her needs.  Pt with supervision goals overall for PT, but min assist for OT.  ST to still evaluate.  Revisions to Treatment Plan:  None   Continued Need for Acute Rehabilitation Level of Care: The patient requires daily medical management by a physician with specialized training in physical medicine and rehabilitation for the following conditions: Daily direction of a multidisciplinary physical rehabilitation program to ensure safe treatment while eliciting the highest outcome that is of practical value to the patient.: Yes Daily medical management of patient stability for increased activity during participation in an intensive rehabilitation regime.: Yes Daily analysis of laboratory values and/or radiology reports with any subsequent need for medication adjustment of medical intervention for : Neurological problems  Talia Hoheisel, [place]ck 04/09/2014, 11:16 AM

## 2014-04-08 NOTE — Progress Notes (Signed)
Pt knows how to turn off bed alarm per NT. Pt stayed at nurses station throughout the night in a chair with a  Quick release belt. Slept quite a bit despite some interruptions. No complaints of pain. Will continue to assess Rudie Meyer, RN

## 2014-04-09 ENCOUNTER — Inpatient Hospital Stay (HOSPITAL_COMMUNITY): Payer: Medicare Other

## 2014-04-09 ENCOUNTER — Inpatient Hospital Stay (HOSPITAL_COMMUNITY): Payer: Medicare Other | Admitting: Occupational Therapy

## 2014-04-09 DIAGNOSIS — I69390 Apraxia following cerebral infarction: Secondary | ICD-10-CM

## 2014-04-09 LAB — GLUCOSE, CAPILLARY
GLUCOSE-CAPILLARY: 89 mg/dL (ref 70–99)
GLUCOSE-CAPILLARY: 88 mg/dL (ref 70–99)
GLUCOSE-CAPILLARY: 74 mg/dL (ref 70–99)
Glucose-Capillary: 87 mg/dL (ref 70–99)
Glucose-Capillary: 105 mg/dL — ABNORMAL HIGH (ref 70–99)

## 2014-04-09 MED ORDER — INSULIN ASPART 100 UNIT/ML ~~LOC~~ SOLN: 0.0000 [IU] | Freq: Three times a day (TID) | SUBCUTANEOUS | Status: DC

## 2014-04-09 NOTE — Progress Notes (Signed)
Patient to nurses station after using bathroom. Patient chose to get in the chair instead of bed. Safety belt on.

## 2014-04-09 NOTE — Progress Notes (Signed)
Inpatient Rehabilitation Center Individual Statement of Services  Patient Name:  Rebecca [name]  Date:  04/09/2014  Welcome to the Inpatient Rehabilitation Center.  Our goal is to provide you with an individualized program based on your diagnosis and situation, designed to meet your specific needs.  With this comprehensive rehabilitation program, you will be expected to participate in at least 3 hours of rehabilitation therapies Monday-Friday, with modified therapy programming on the weekends.  Your rehabilitation program will include the following services:  Physical Therapy (PT), Occupational Therapy (OT), Speech Therapy (ST), 24 hour per day rehabilitation nursing, Case Management (Social Worker), Rehabilitation Medicine, Nutrition Services and Pharmacy Services  Weekly team conferences will be held on Wednesdays to discuss your progress.  Your Social Worker will talk with you frequently to get your input and to update you on team discussions.  Team conferences with you and your family in attendance may also be held.  Expected length of stay:  7 to 10 days Overall anticipated outcome:  Supervision to minimal assistance  Depending on your progress and recovery, your program may change. Your Social Worker will coordinate services and will keep you informed of any changes. Your Social Worker's name and contact numbers are listed  below.  The following services may also be recommended but are not provided by the Inpatient Rehabilitation Center:   Driving Evaluations  Home Health Rehabiltiation Services  Outpatient Rehabilitation Services   Arrangements will be made to provide these services after discharge if needed.  Arrangements include referral to agencies that provide these services.  Your insurance has been verified to be:  Medicare and [place] Your primary doctor is:  Dr. Clyda Greener  Pertinent information will be shared with your doctor and your insurance company.  Social  Worker:  Staci Acosta, LCSW  [phone] or [phone]  Information discussed with and copy given to patient by: Elvera Lennox, 04/09/2014, 11:36 AM

## 2014-04-09 NOTE — Progress Notes (Signed)
Social Work Patient ID: Rebecca [name], female   DOB: Apr 08, 1945, 69 y.o.   MRN: [id number]   CSW met with pt and pt's son, [name], on 04-08-14 to update them on team conference discussion.  Pt's son is trying to figure out a plan for pt's d/c.  He is willing to have pt come to his home, but is not sure of providing some of the personal care his mother may need.  CSW suggested that son come to watch pt go through therapies and see if he feels he can provide the care.  CSW also mentioned to him that we could look for SNF care if that would be better for pt to make that move first before coming to his home.  Pt's son will think about options and see her therapies/progress and we will discuss it again beginning of next week.  Son is very supportive of his mother and is concerned that she is not able to communicate with him and that pt has a better reaction to the staff when they enter than room than to him and his brother.  CSW offered support and will continue to follow pt and assist her and family with the next step in pt's recovery plan.

## 2014-04-09 NOTE — Progress Notes (Signed)
Speech Language Pathology Daily Session Note  Patient Details  Name: Rebecca [name] MRN: [id number] Date of Birth: 1946/02/13  Today's Date: 04/09/2014 SLP Co-Treatment Time: 1100 (Co-tx with PT ([phone] SLP Co-Treatment Time Calculation (min): 30 min  Short Term Goals: Week 1: SLP Short Term Goal 1 (Week 1): Pt will initiate a timely swallow response over 50% of observable opportunities during presentations of her currently prescribed diet with max assist multimodal cuing.   SLP Short Term Goal 2 (Week 1): Pt will focus attention to verbal/auditory stimulation during structured therapeutic tasks for 15-30 seconds with max assist.   SLP Short Term Goal 3 (Week 1): Pt will follow 1 step commands for 25-50% accuracy with max assist multimodal cuing.  SLP Short Term Goal 4 (Week 1): Pt will improve functional communication via any modality (i.e. gestures, pointing, facial expressions, vocalization) during structured tasks with max assist multimodal cuing.   Skilled Therapeutic Interventions:  Pt was seen for a skilled PT/ST co-tx targeting goals for cognition and functional communication.  Please see PT note for details related to specific goals and interventions.  Pt was received from the RN station, seated upright in wheelchair, awake, alert, and conveyed agreement to participate in ST via nonverbal means (i.e. Head nod).  SLP and PT facilitated the session with basic, familiar kitchen tasks targeting sustained attention and initiation.  Pt required max-total assist to sort basic food items into two categories by size.  Pt was noted with significantly improved attention, initiation, and participation in structured therapeutic tasks when she was given increased processing time and allowed some flexibility in the sequence with which she completed structured tasks.  Pt was also noted with x1 attempt to verbalize needs and wants; however, no vocalizations appreciated at this date due to  impaired coordination of breath support for speech with articulation.  Pt consistently declined PO trials of nectar thick liquids when offered throughout the treatment session.  Continue per current plan of care.     FIM:  Comprehension Comprehension Mode: Auditory Comprehension: 2-Understands basic 25 - 49% of the time/requires cueing 51 - 75% of the time Expression Expression Mode: Nonverbal Expression: 1-Expresses basis less than 25% of the time/requires cueing greater than 75% of the time. Social Interaction Social Interaction: 2-Interacts appropriately 25 - 49% of time - Needs frequent redirection. Problem Solving Problem Solving: 2-Solves basic 25 - 49% of the time - needs direction more than half the time to initiate, plan or complete simple activities Memory Memory: 1-Recognizes or recalls less than 25% of the time/requires cueing greater than 75% of the time FIM - Eating Eating Activity: 2: Hand over hand assist  Pain Pain Assessment Pain Assessment: No/denies pain Faces Pain Scale: No hurt  Therapy/Group: Individual Therapy  Sharine Cadle, Melanee Spry 04/09/2014, 12:57 PM

## 2014-04-09 NOTE — Progress Notes (Signed)
Patient brought to nursing station in recliner after toilet attempt and refusing to go back to bed. Quick release belt in place.

## 2014-04-09 NOTE — Progress Notes (Signed)
Social Work Assessment and Plan  Patient Details  Name: Rebecca [name] MRN: [id number] Date of Birth: 1945/06/24  Today's Date: 04/09/2014  Problem List:  Patient Active Problem List   Diagnosis Date Noted  . 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 Cardioembolic stroke 30/16/0109  . Protein-calorie malnutrition, severe 04/05/2014  . Global aphasia   . Paroxysmal atrial fibrillation   . Hyperlipidemia   . Acute diastolic heart failure 32/35/5732  . Elevated troponin I level   . Essential hypertension   . Acute on chronic combined systolic and diastolic CHF (congestive heart failure)   . Pulmonary hypertension   . Chronic atrial fibrillation   . Diabetes type 2, controlled   . HLD (hyperlipidemia)   . Elevated troponin 04/01/2014  . Stroke 04/01/2014  . Aphasia 04/01/2014  . 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 Atrial fibrillation 05/01/2013  . Tobacco abuse 05/01/2013  . Expressive aphasia 03/23/2013  . Diabetes mellitus 03/23/2013  . Hypertension 03/23/2013  . Decreased calculated GFR 03/23/2013   Past Medical History:  Past Medical History  Diagnosis Date  . Hypertension   . 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 Expressive aphasia 03/23/2013  . Atrial fibrillation 05/01/2013  . Tobacco abuse 05/01/2013  . Diabetes mellitus without complication   . Depression   . Stroke     2015   Past Surgical History:  Past Surgical History  Procedure Laterality Date  . Tee without cardioversion N/A 03/26/2013    Procedure: TRANSESOPHAGEAL ECHOCARDIOGRAM (TEE);  Surgeon: Sanda Klein, MD;  Location: [facility] ENDOSCOPY;  Service: Cardiovascular;  Laterality: N/A;  . Cesarean section      x3  . Hip replacement Right 1990's  . Hip replacemet Left 1990's   Social History:  reports that she has never smoked. She does not have any smokeless tobacco history on file. She reports that she does not drink alcohol or use illicit drugs.  Family / Support Systems Marital Status:  Single Patient Roles: Caregiver (Pt helps her son who is on HD.) Children: Rebecca [name] - son [phone] Anticipated Caregiver: [name] (other son who takes HD will be moving into his own apartment soon) Ability/Limitations of Caregiver: [name] has some concerns over helping his mom with self care skills and if she will be incontinent or not. He is willing to try to help her and hopes she will be as independent as possible. Caregiver Availability: 24/7 Family Dynamics: supportive son  Social History Preferred language: English Religion: None Read: Yes Write: Yes Employment Status: Retired (pt retired from the [employer]) Date Retired/Disabled/Unemployed: 2004 Age Retired: 56 Public relations account executive Issues: none reported Guardian/Conservator: none reported   Abuse/Neglect Physical Abuse: Denies Verbal Abuse: Denies Sexual Abuse: Denies Exploitation of patient/patient's resources: Denies Self-Neglect: Denies  Emotional Status Pt's affect, behavior and adjustment status: Pt cannot communicate with CSW due to her aphasia and cognitive deficits, so it was difficult to assess this.  Pt seemed a little frustrated by her lack of ability to communicate.  Pt's son admits to this being hard for him to go through, but he is encouraged because he sees how much better she is now compared to when she first came in to the hospital. Recent Psychosocial Issues: Pt's son is planning to sell pt's house so they can find more appropriate, accessible housing for pt. Psychiatric History: depression on and off throughout her life Substance Abuse History: None reported  Patient / Family Perceptions, Expectations & Goals Pt/Family understanding of illness & functional limitations: Son  has a good understanding of pt's condition and feels his questions have been answered thus far. Premorbid pt/family roles/activities: Pt enjoys spending time in her garden, planting things and being  outside. Anticipated changes in roles/activities/participation: Pt's son would like for pt to enjoy the things she used to enjoy when she can. Pt/family expectations/goals: Son would like for pt to get as independent as possible.  Community [employer] Agencies: None Premorbid Home Care/DME Agencies: Other (Comment) (had [facility] January 2015) Transportation available at discharge: son Resource referrals recommended: Support group (specify)  Discharge Planning Living Arrangements: Children (pt's son was living with her, but was planning to move to an apartment anyway.) Support Systems: Children, Other relatives Type of Residence: Private residence Insurance Resources: [employer], Multimedia programmer (specify) Psychologist, counselling) Financial Resources: [place] Referred: No Living Expenses: Own Money Management: Patient, Family Does the patient have any problems obtaining your medications?: No Home Management: Pt was taking care of her home PTA. Patient/Family Preliminary Plans: Son is trying to determine if he can provide the care pt will need at d/c.  Otherwise, pt's son will explore the option of SNF with CSW. Barriers to Discharge: Steps, Self care Social Work Anticipated Follow Up Needs: HH/OP Expected length of stay: 7-10 days  Clinical Impression CSW met with pt and son 04-08-14 to introduce self and role of CSW, as well as complete assessment.  Pt was not able to communicate her answers to CSW's questions, so son spoke on her behalf.  She seemed to agree with most of what he said, although it is difficult to ascertain pt's level of understanding.  Son is very willing to have pt at his home and was already planning to sell pt's home so that they could find more suitable housing for pt PTA.  Son is unsure if he can provide pt with all of the care needs she will have, especially in regard to personal care needs.  CSW encouraged son to come observe pt in therapy  and see what her needs are going to be and then we would talk at the beginning of the week to make a d/c plan - home with him vs. SNF.  He was agreeable to this.  CSW will assist son and pt, as needed, as we plan for pt's d/c.  Elvyn Krohn, Silvestre Mesi 04/09/2014, 2:26 PM

## 2014-04-09 NOTE — Progress Notes (Signed)
69 y.o. 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 Independent prior to admission living with her son. 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 Bifrontal encephalomalacia further propagation on the right new on the left consistent with interval bilateral posterior cerebral artery territory infarct. MRA with decreased flow in left MCA. Carotid Dopplers with no ICA stenosis. Echocardiogram with ejection fraction of 50% no wall motion abnormalities. EEG negative for seizure. Patient did not receive TPA. Neurology consulted placed on Eliquis alone as there was some question to her medical compliance with Xarelto prior to admission.   Subjective/Complaints: Per PT responding to gestural cues, per RN slept well last noc Appetite poor  Remains severely aphasic/apraxic Review of Systems - cannot obtain secondary to aphasia  Objective: Vital Signs: Blood pressure 163/97, pulse 60, temperature 98.4 F (36.9 C), temperature source Oral, resp. rate 18, SpO2 100 %. No results found. Results for orders placed or performed during the hospital encounter of 04/07/14 (from the past 72 hour(s))  Glucose, capillary     Status: Abnormal   Collection Time: 04/07/14  8:01 PM  Result Value Ref Range   Glucose-Capillary 100 (H) 70 - 99 mg/dL  Glucose, capillary     Status: Abnormal   Collection Time: 04/08/14 12:04 AM  Result Value Ref Range   Glucose-Capillary 113 (H) 70 - 99 mg/dL  Glucose, capillary     Status: None   Collection Time: 04/08/14  3:50 AM  Result  Value Ref Range   Glucose-Capillary 99 70 - 99 mg/dL  CBC WITH DIFFERENTIAL     Status: Abnormal   Collection Time: 04/08/14  7:30 AM  Result Value Ref Range   WBC 4.6 4.0 - 10.5 K/uL   RBC 4.64 3.87 - 5.11 MIL/uL   Hemoglobin 12.7 12.0 - 15.0 g/dL   HCT 38.7 36.0 - 46.0 %   MCV 83.4 78.0 - 100.0 fL   MCH 27.4 26.0 - 34.0 pg   MCHC 32.8 30.0 - 36.0 g/dL   RDW 15.0 11.5 - 15.5 %   Platelets 209 150 - 400 K/uL   Neutrophils Relative % 58 43 - 77 %   Neutro Abs 2.6 1.7 - 7.7 K/uL   Lymphocytes Relative 28 12 - 46 %   Lymphs Abs 1.3 0.7 - 4.0 K/uL   Monocytes Relative 14 (H) 3 - 12 %   Monocytes Absolute 0.6 0.1 - 1.0 K/uL   Eosinophils Relative 0 0 - 5 %   Eosinophils Absolute 0.0 0.0 - 0.7 K/uL   Basophils Relative 0 0 - 1 %   Basophils Absolute 0.0 0.0 - 0.1 K/uL  Comprehensive metabolic panel     Status: Abnormal   Collection Time: 04/08/14  7:30 AM  Result Value Ref Range   Sodium 144 135 - 145 mmol/L    Comment: Please note change in reference range.   Potassium 4.1 3.5 - 5.1 mmol/L    Comment: Please note change in reference range.   Chloride 104 96 - 112 mEq/L   CO2 27 19 - 32 mmol/L   Glucose, Bld 97 70 - 99 mg/dL   BUN 26 (H) 6 -  23 mg/dL   Creatinine, Ser 1.00 0.50 - 1.10 mg/dL   Calcium 9.5 8.4 - 10.5 mg/dL   Total Protein 7.5 6.0 - 8.3 g/dL   Albumin 3.6 3.5 - 5.2 g/dL   AST 35 0 - 37 U/L   ALT 19 0 - 35 U/L   Alkaline Phosphatase 37 (L) 39 - 117 U/L   Total Bilirubin 0.9 0.3 - 1.2 mg/dL   GFR calc non Af Amer 56 (L) >90 mL/min   GFR calc Af Amer 65 (L) >90 mL/min    Comment: (NOTE) The eGFR has been calculated using the CKD EPI equation. This calculation has not been validated in all clinical situations. eGFR's persistently <90 mL/min signify possible Chronic Kidney Disease.    Anion gap 13 5 - 15  Glucose, capillary     Status: Abnormal   Collection Time: 04/08/14  7:35 AM  Result Value Ref Range   Glucose-Capillary 116 (H) 70 - 99 mg/dL  Glucose,  capillary     Status: Abnormal   Collection Time: 04/08/14 12:56 PM  Result Value Ref Range   Glucose-Capillary 103 (H) 70 - 99 mg/dL  Glucose, capillary     Status: Abnormal   Collection Time: 04/08/14  4:35 PM  Result Value Ref Range   Glucose-Capillary 108 (H) 70 - 99 mg/dL   Comment 1 Notify RN   Glucose, capillary     Status: None   Collection Time: 04/08/14  8:31 PM  Result Value Ref Range   Glucose-Capillary 97 70 - 99 mg/dL  Glucose, capillary     Status: Abnormal   Collection Time: 04/08/14 11:53 PM  Result Value Ref Range   Glucose-Capillary 103 (H) 70 - 99 mg/dL  Glucose, capillary     Status: None   Collection Time: 04/09/14  4:31 AM  Result Value Ref Range   Glucose-Capillary 87 70 - 99 mg/dL  Glucose, capillary     Status: None   Collection Time: 04/09/14  7:50 AM  Result Value Ref Range   Glucose-Capillary 89 70 - 99 mg/dL   Comment 1 Notify RN      HEENT: normal Cardio: RRR and no murmurs Resp: CTA B/L and unlabored GI: BS positive and nontender nondistended Extremity:  Pulses positive and No Edema Skin:   Intact Neuro: Alert/Oriented, Abnormal Sensory unable to assess secondary to aphasia and Abnormal Motor 4/5 bilateral upper and lower extremities Musc/Skel:  Other no pain with upper or lower extremity active range of motion, no joint swelling noted Gen. no acute distress   Assessment/Plan: 1. 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 Physiatrist is providing close team supervision and 24 hour management of active medical problems listed below. Physiatrist and rehab team continue to assess barriers to discharge/monitor patient progress toward functional and medical goals. FIM: FIM - Bathing Bathing Steps Patient Completed: Right Arm, Abdomen, Front perineal area, Right upper leg, Left upper leg Bathing: 3:  Mod-Patient completes 5-7 [id number] parts or 50-74%  FIM - Upper Body Dressing/Undressing Upper body dressing/undressing steps patient completed: Thread/unthread left sleeve of pullover shirt/dress Upper body dressing/undressing: 2: Max-Patient completed 25-49% of tasks FIM - Lower Body Dressing/Undressing Lower body dressing/undressing steps patient completed: Pull pants up/down Lower body dressing/undressing: 1: Total-Patient completed less than 25% of tasks  FIM - Toileting Toileting: 1: Total-Patient completed zero steps, helper did all 3  FIM -  Radio producer Devices: Product manager Transfers: 4-To toilet/BSC: Min A (steadying Pt. > 75%), 4-From toilet/BSC: Min A (steadying Pt. > 75%)  FIM - Bed/Chair Transfer Bed/Chair Transfer Assistive Devices: Arm rests Bed/Chair Transfer: 4: Bed > Chair or W/C: Min A (steadying Pt. > 75%) (upright recliner> w/c)  FIM - Locomotion: Wheelchair Distance: 15 Locomotion: Wheelchair: 1: Travels less than 50 ft with supervision, cueing or coaxing FIM - Locomotion: Ambulation Locomotion: Ambulation Assistive Devices: Other (comment) (none) Ambulation/Gait Assistance: 3: Mod assist Locomotion: Ambulation: 1: Travels less than 50 ft with moderate assistance (Pt: 50 - 74%)  Comprehension Comprehension Mode: Auditory Comprehension: 1-Understands basic less than 25% of the time/requires cueing 75% of the time  Expression Expression Mode: Nonverbal Expression: 1-Expresses basis less than 25% of the time/requires cueing greater than 75% of the time.  Social Interaction Social Interaction Mode: Asleep Social Interaction: 1-Interacts appropriately less than 25% of the time. May be withdrawn or combative.  Problem Solving Problem Solving Mode: Asleep Problem Solving: 1-Solves basic less than 25% of the time - needs direction nearly all the time or does not effectively solve problems and may need a restraint for  safety  Memory Memory Mode: Not assessed Memory: 1-Recognizes or recalls less than 25% of the time/requires cueing greater than 75% of the time  Medical Problem List and Plan: 1. 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  DVT Prophylaxis/Anticoagulation: Eliquis. Monitor for any bleeding episodes 3. Pain Management: Tylenol as needed   4. Dysphagia.[name] Dysphagia 1 nectar liquids. Follow-up speech therapy 5. Neuropsych: This patient is not capable of making decisions on her own behalf. 6. Skin/Wound Care: Routine skin checks 7. Fluids/Electrolytes/Nutrition: Strict I and O follow-up chemistries 8. Atrial fibrillation/hypertension. Presently on Eliquis and low-dose Lopressor. Follow-up cardiology services. Cardiac rate control.(Patient on lisinopril 5 mg daily and hydrochlorothiazide 25 mg daily prior to admission and will resume as tolerated) 9. Mood/depression. Lexapro 5 mg daily. Provide emotional support 10. Hyperlipidemia. Lipitor 11. Diabetes mellitus with peripheral neuropathy. Hemoglobin A1c 6.0. Check blood sugars before meals and at bedtime 12. Acute on chronic diastolic congestive heart failure. Monitor for any signs of fluid overload 13. Tobacco abuse. Counseling  LOS (Days) 2 A FACE TO FACE EVALUATION WAS PERFORMED  KIRSTEINS,ANDREW E 04/09/2014, 8:19 AM

## 2014-04-09 NOTE — Progress Notes (Signed)
Andrey Farmer [facility] Admission Coordinator Signed Physical Medicine and Rehabilitation PMR Pre-admission 04/07/2014 10:11 AM  Related encounter: ED to Hosp-Admission (Discharged) from 04/01/2014 in [facility] 2C STEPDOWN    Expand All Collapse All   PMR Admission Coordinator Pre-Admission Assessment  Patient: Rebecca [name] is an 69 y.o., female MRN: [id number] DOB: 1945/08/11 Height:  (162.6 cm) Weight: 58.605 kg (129 lb 3.2 oz)  Insurance Information  PRIMARY: Medicare A & B Policy#: [id number] Subscriber: self Pre-Cert#: verified in [employer]: retired Financial risk analyst. Date: A & B: 08-18-09 Deduct: $1288 Out of Pocket Max: none Life Max: unlimited CIR: 100% SNF: 100% days 1-20; 80% days 21-100 (100 days max) Outpatient: 80% Co-Pay: 20% Home Health: 100% Co-Pay: none DME: 80% Co-Pay: 20% Providers: pt's preference  SECONDARY: Generic [employer] Policy#: [id number] Subscriber: self Benefits: Phone #: [phone]   Emergency Contact Information Contact Information    Name Relation Home Work [place] Son [phone]       Current Medical History  Patient Admitting Diagnosis: left frontal infarct  History of Present Illness: Rebecca [name] is a 69 y.o. 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 At baseline patient used a cane. Presented 04/01/2014 with worsening aphasia and right-sided weakness as well as respiratory compromise and placed on BiPAP.[name] MRI of the brain shows acute on chronic left frontal lobe middle cerebral  artery territory infarct. Bifrontal encephalomalacia further propagation on the right new on the left consistent with interval bilateral posterior cerebral artery territory infarct. MRA with decreased flow in left MCA. Carotid Dopplers with no ICA stenosis. Echocardiogram with ejection fraction of 50% no wall motion abnormalities. EEG negative for seizure. Patient did not receive TPA. Neurology consulted placed on Eliquis as there was some question to her medical compliance with Xarelto prior to admission as well as low-dose aspirin. Dysphagia one nectar thick liquid diet.  NIH Total: 6  Past Medical History  Past Medical History  Diagnosis Date  . Hypertension   . 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 Expressive aphasia 03/23/2013  . Atrial fibrillation 05/01/2013  . Tobacco abuse 05/01/2013  . Diabetes mellitus without complication   . Depression   . Stroke     2015    Family History  family history includes Alcohol abuse in her maternal aunt.  Prior Rehab/Hospitalizations: pt had a previous CVA in January 2015 and had follow up SLP.  Current Medications   Current facility-administered medications:  . 0.9 % sodium chloride infusion, , Intravenous, Continuous, Leone Brand, NP, Last Rate: 10 mL/hr at 04/04/14 1419, 10 mL/hr at 04/04/14 1419 . acetaminophen (TYLENOL) suppository 650 mg, 650 mg, Rectal, Q6H PRN, Drema Dallas, MD, 650 mg at 04/05/14 0016 . acetaminophen (TYLENOL) tablet 650 mg, 650 mg, Oral, Q6H PRN, Lonia Blood, MD . antiseptic oral rinse (CPC / CETYLPYRIDINIUM CHLORIDE 0.05%) solution 7 mL, 7 mL, Mouth Rinse, q12n4p, Lorretta Harp, MD, 7 mL at 04/06/14 1600 . apixaban (ELIQUIS) tablet 5 mg, 5 mg, Oral, BID, Lonia Blood, MD, 5 mg at 04/07/14 1049 . aspirin chewable tablet 81 mg, 81 mg, Oral, Daily, Lonia Blood, MD, 81 mg at 04/07/14 1049 . atorvastatin (LIPITOR) tablet 40 mg, 40 mg, Oral, q1800, Lorretta Harp, MD, 40 mg  at 04/06/14 1712 . chlorhexidine (PERIDEX) 0.12 % solution 15 mL, 15 mL, Mouth Rinse,  BID, Lorretta Harp, MD, 15 mL at 04/07/14 [phone] . escitalopram (LEXAPRO) tablet 5 mg, 5 mg, Oral, Daily, Lorretta Harp, MD, 5 mg at 04/06/14 0906 . feeding supplement (JEVITY 1.2 CAL) liquid 1,000 mL, 1,000 mL, Per Tube, Continuous, Heather Cornelison Pitts, RD, Last Rate: 60 mL/hr at 04/06/14 0955, 1,000 mL at 04/06/14 0955 . insulin aspart (novoLOG) injection 0-9 Units, 0-9 Units, Subcutaneous, 6 times per day, Drema Dallas, MD, 1 Units at 04/06/14 1711 . loratadine (CLARITIN) tablet 10 mg, 10 mg, Oral, Daily, Lorretta Harp, MD, 10 mg at 04/07/14 1049 . metoprolol tartrate (LOPRESSOR) tablet 12.5 mg, 12.5 mg, Oral, BID, Lonia Blood, MD, 12.5 mg at 04/07/14 1049 . multivitamin with minerals tablet 1 tablet, 1 tablet, Oral, Daily, Lorretta Harp, MD, 1 tablet at 04/07/14 1048 . polyvinyl alcohol (LIQUIFILM TEARS) 1.4 % ophthalmic solution 1 drop, 1 drop, Both Eyes, PRN, Lorretta Harp, MD . RESOURCE THICKENUP CLEAR, , Oral, PRN, Lonia Blood, MD . senna-docusate (Senokot-S) tablet 1 tablet, 1 tablet, Oral, QHS PRN, Lorretta Harp, MD  Patients Current Diet: DIET - DYS 1, nectar thick, full supervision, small sips, check for pocketing, pills whole in puree  Precautions / Restrictions Precautions Precautions: Fall Precaution Comments: pt is unstready on her feet.  Restrictions Weight Bearing Restrictions: No   Prior Activity Level Limited Community (1-2x/wk): Pt was independent prior to CVA and did get out and drive on errands about 3 x/week. Son states that pt prefers to be solitary and used to enjoy gardening and being outside.   Home Assistive Devices / Equipment  pt has a shower bench and cane per son.  Prior Functional Level Prior Function Comments: Patient with significant aphasia and unable to report PLOF. Pt's son [name] gave baseline information.   Current Functional Level Cognition   Arousal/Alertness: Awake/alert Overall Cognitive Status: Difficult to assess Difficult to assess due to: Impaired communication Orientation Level: Oriented to person, Other (comment) (nonverbal) Following Commands: Follows one step commands consistently, Follows one step commands with increased time, Follows multi-step commands inconsistently General Comments: patient responding to commands with verbal and tactile cues Attention: Sustained Sustained Attention: Impaired Sustained Attention Impairment: Verbal basic, Functional basic Memory: (TBA) Awareness: Impaired Awareness Impairment: Intellectual impairment, Emergent impairment, Anticipatory impairment Problem Solving: Appears intact Behaviors: Restless Safety/Judgment: Impaired   Extremity Assessment (includes Sensation/Coordination)  Upper Extremity Assessment: Difficult to assess due to impaired cognition (PROM WFL, does move arms against gravity)  Lower Extremity Assessment: Defer to PT evaluation    ADLs  Overall ADL's : Needs assistance/impaired Eating/Feeding: NPO Grooming: Wash/dry hands, Wash/dry face, Oral care, Moderate assistance Grooming Details (indicate cue type and reason): hand over hand to initiate washing hands with cloth, then patient perseverating on task. Patient unable to follow verbal/visual/tactile cues to wash face. Mod A to swab mouth with toothette after verbal and hand over hand initiation of task. Upper Body Bathing: Total assistance Upper Body Bathing Details (indicate cue type and reason): Attempted to use verbal/visual/tactile cues and hand over hand to initiate this task but patient unable to follow. Lower Body Bathing: Total assistance Functional mobility during ADLs: (mod A supine to long sit in the bed) General ADL Comments: Patient limited by global aphasia, decreased motor planning, apraxia, difficulty processing information and following commands. She does move her arms spontaneously in  bed and was able to wash hands once given verbal/visual/tactile cues but then became perseverative. Unable to wash face or perform UB bathing. Swabbed mouth with toothette and verbal/visual/tactile cues  with initial hand over hand and mod A for thoroughness.    Mobility  Overal bed mobility: Needs Assistance Bed Mobility: Supine to Sit Supine to sit: Mod assist General bed mobility comments: Observed with nursing to come to EOB,     Transfers  Overall transfer level: Needs assistance Equipment used: Rolling walker (2 wheeled) Transfers: Sit to/from Stand Sit to Stand: Min guard Stand pivot transfers: Min assist General transfer comment: min guard for stability, patient was able to perform x3 with no physical assist on the 2nd and 3rd transfer    Ambulation / Gait / Stairs / Wheelchair Mobility  Ambulation/Gait Ambulation/Gait assistance: Min assist, Mod assist Ambulation Distance (Feet): 90 Feet Assistive device: Rolling walker (2 wheeled) Gait Pattern/deviations: Step-through pattern, Scissoring, Staggering left, Staggering right Gait velocity: decreased Gait velocity interpretation: Below normal speed for age/gender General Gait Details: patient with difficulty controlling RW initially, continuously off to the right during mobility with manual assist to bring back in positionin. Patient with occassional occurance of stepping on her own feet during scissoring, attempted cues- unsuccessful response.     Posture / Balance  see PT note, needing min to mod assist with dynamic standing activities    Special needs/care consideration BiPAP/CPAP no CPM no  Continuous Drip IV no Dialysis no  Life Vest no  Oxygen no  Special Bed no  Trach Size no  Wound Vac (area) no  Skin - no current issues  Bowel mgmt: last BM on 04-01-14 Bladder mgmt: incontinence noted per RN Diabetic mgmt -managed at home with meds    Previous  Home Environment Living Arrangements: Spouse/significant other Additional Comments: Pt with significant aphasia and unable to report hx.   Discharge Living Setting Plans for Discharge Living Setting: Other (Comment) (Possible plan to go live with pt's son [name], see comments below) Type of Home at Discharge: House Discharge Home Layout: Two level, One level Discharge Home Access: Stairs to enter Does the patient have any problems obtaining your medications?: No  Social/Family/Support Systems Patient Roles: Caregiver (pt cares for adult disabled son who is on HD. This son does not need physical assist and will be moving out to his own apartment soon per [name].) Contact Information: Son Khaliah Boehlke  Anticipated Caregiver: son [name] (other son who takes HD will be moving into his own apartment soon) Anticipated Caregiver's Contact Information: see above Ability/Limitations of Caregiver: Son [name] has some concerns over helping his mom with self care skills and if she will be incontinent or not. He is willing to try to help her and hopes she will be as independent as possible. [name] also stated they are working towards putting pt's house on the market. Caregiver Availability: 24/7 Discharge Plan Discussed with Primary Caregiver: Yes (discussed ) Is Caregiver In Agreement with Plan?: Yes Does Caregiver/Family have Issues with Lodging/Transportation while Pt is in Rehab?: No  Goals/Additional Needs Patient/Family Goal for Rehab: Supervision with PT/OT and mod assist with SLP Expected length of stay: 11-15 days Cultural Considerations: none Dietary Needs: Dys 1, nectar thick, full supervision, small sips, check for pocketing, pills whole in puree Equipment Needs: to be determined Pt/Family Agrees to Admission and willing to participate: Yes (spoke with pt's son [name] by phone on 04-07-14) Program Orientation Provided & Reviewed with Pt/Caregiver Including Roles & Responsibilities:  Yes   Decrease burden of Care through IP rehab admission: NA   Possible need for SNF placement upon discharge: Possible, pt's son [name] shared that he is willing to have  pt move in with him at the end of rehab but he has concerns about pt's self care tasks (like bathing/being incontinent). He is willing to have her home with him if he is comfortable assisting her.  Patient Condition: This patient's condition remains as documented in the consult dated 04-07-14, in which the Rehabilitation Physician determined and documented that the patient's condition is appropriate for intensive rehabilitative care in an inpatient rehabilitation facility. Will admit to inpatient rehab today.  Preadmission Screen Completed By: Juliann Mule, PT, 04/07/2014 12:09 PM ______________________________________________________________________  Discussed status with Dr. Riley Kill on 04-07-14 at 1204 and received telephone approval for admission today.  Admission Coordinator: Juliann Mule, PT, time1204/Date 04-07-14          Cosigned by: Ranelle Oyster, MD at 04/07/2014 12:55 PM  Revision History     Date/Time User Provider Type Action   04/07/2014 12:55 PM Ranelle Oyster, MD Physician Cosign   04/07/2014 12:09 PM Andrey Farmer Macil Crady Rehab Admission Coordinator Sign

## 2014-04-09 NOTE — Progress Notes (Signed)
Physical Therapy Session Note  Patient Details  Name: Rebecca [name] MRN: [id number] Date of Birth: March 31, 1945  Today's Date: 04/09/2014 PT Co-Treatment Time:  11:30- 12:05    Short Term Goals: Week 1:  PT Short Term Goal 1 (Week 1): = LTGs due to LOS  Skilled Therapeutic Interventions/Progress Updates:  Pt received for co-tx with SLP at nurse's station in w/c with quick release. Pt continues to present to PT as non-verbal.  Today's treatment focused on cognitive, therapeutic activities in multiple functional positions with household tasks for improved initiation and attention to task. Tx performed in ADL apartment kitchen and bedroom with no AD and mod A due to continuous, multimodal cueing, coaxing, and reminders for attention.   Activities included: - Sweeping kitchen and putting away broom in closet x 15 minutes - Sorting boxes/canned food at counter x 11 minutes, making bed x 5 minutes  - Rest breaks with offers of nectar juice, which pt refuses.   Pt transferred with S from standing to w/c and was returned to nurses station with quick release belt for full supervision.  Pt tolerated treatment well. Pt is compliant to most activities, but requires max multimodal cueing for initiation and sustained attention to task. Physically, pt demonstrates physical ability to attend to R and good reach across midline but with max cueing. Good postural control, demonstrating appropriate righting responses, midline orientation, and bimanual hand use. Transfers standing>EOB and standing> w/c demonstrate appropriate technique for eccentric lowering with control.  Some possible pain behavior noted: pt puts L hand to L hip/L rear pelvis with fatigue to standing tasks. When pt is asked if she is in pain, she sometimes shrugs. Yes/no questions are difficult to determine pt's response.   Therapy Documentation Precautions:  Precautions Precautions: Fall Precaution Comments: pt is unstready on her  feet. pt apparently knows how to turn off bed alarm Restrictions Weight Bearing Restrictions: No Pain: Pain Assessment Pain Assessment: Faces Faces Pain Scale: No hurt  Locomotion : Ambulation Ambulation/Gait Assistance: 3: Mod assist   See FIM for current functional status  Therapy/Group: Co-Treatment  Carollee Leitz 04/09/2014, 12:47 PM

## 2014-04-09 NOTE — Progress Notes (Signed)
Social Work Patient ID: [name], female   DOB: 08-Feb-1946, 69 y.o.   MRN: [id number]   Vista Deck Kenora Spayd, LCSW Social Worker Signed  Patient Care Conference 04/08/2014 11:51 AM    Expand All Collapse All   Inpatient RehabilitationTeam Conference and Plan of Care Update Date: 04/08/2014   Time: 10:40 AM     Patient Name: Rebecca [name]       Medical Record Number: [id number]  Date of Birth: 1945/11/16 Sex: Female         Room/Bed: 4W23C/4W23C-01 Payor Info: Payor: MEDICARE / Plan: MEDICARE PART A AND B / Product Type: *No Product type* /    Admitting Diagnosis: L FRONTAL CVA   Admit Date/Time:  04/07/2014  4:53 PM Admission Comments: No comment available   Primary Diagnosis:  Cardioembolic stroke Principal Problem: Cardioembolic stroke    Patient Active Problem List     Diagnosis  Date Noted   .  Apraxia following CVA (cerebrovascular accident)  04/09/2014   .  Cardioembolic stroke  30/16/0109   .  Protein-calorie malnutrition, severe  04/05/2014   .  Global aphasia     .  Paroxysmal atrial fibrillation     .  Hyperlipidemia     .  Acute diastolic heart failure  04/02/2014   .  Elevated troponin I level     .  Essential hypertension     .  Acute on chronic combined systolic and diastolic CHF (congestive heart failure)     .  Pulmonary hypertension     .  Chronic atrial fibrillation     .  Diabetes type 2, controlled     .  HLD (hyperlipidemia)     .  Elevated troponin  04/01/2014   .  Stroke  04/01/2014   .  Aphasia  04/01/2014   .  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  Atrial fibrillation  05/01/2013   .  Tobacco abuse  05/01/2013   .  Expressive aphasia  03/23/2013   .  Diabetes mellitus  03/23/2013   .  Hypertension  03/23/2013   .  Decreased calculated GFR  03/23/2013     Expected Discharge Date:  7 to 10 days => SNF?  Team Members Present: Physician leading conference: Dr. Claudette Laws Social Worker Present: Staci Acosta, LCSW Nurse Present:  Carmie End, RN PT Present: Wanda Plump, PT;Blair Hobble, PT OT Present: Rosalio Loud, Kirkland Hun, OT SLP Present: Jackalyn Lombard, SLP PPS Coordinator present : Tora Duck, RN, CRRN        Current Status/Progress  Goal  Weekly Team Focus   Medical     severe apraxia, poor intake  adequate intake  med management, IVF   Bowel/Bladder     Incontinent of bowel and bladder LBM 1/20  Timed toileting q 3-4 hrs  Assist with cont.   Swallow/Nutrition/ Hydration     pending ST eval         ADL's     mod A bathing, max-total A with dressing and toileting, min A standpivot toilet transfers, apraxia, global aphasia  supervision standing balance and toilet transfers; min A bathing, dressing and toileting  ADL retraining, functional mobility, balance, RUE functional use, communication adaptations, family ed   Mobility     transfers Min A; w/c self-prop 15' Mod A; gait Mod A x40' no AD; 5 stairs Mod A   supervision basic and car transfers, gait x 150' and up/down 5 steps 2 rails  neuro  re-ed; balance; gait training; ther ex; ther activity; communication   Communication     pending ST eval          Safety/Cognition/ Behavioral Observations    pending ST eval          Pain     no s/s of pain  assess face for discomfort/pain and medicate appropriately  assess pain level and effective of medication    Skin     no skin breakdown noted  assess skin q shift  assist to keep skin clean and dry    Rehab Goals Patient on target to meet rehab goals: Yes Rehab Goals Revised: None - pt is just being evaluated today *See Care Plan and progress notes for long and short-term goals.    Barriers to Discharge:  severe cognitive deficits     Possible Resolutions to Barriers:   see above     Discharge Planning/Teaching Needs:   CSW will meet with son [name] to discuss the d/c plan for pt.  Team questions the need for pt to go to SNF after CIR.  Family education is available to pt's family should they  plan to take pt home at d/c.    Team Discussion:    MD stated that pt has pretty good strength, but her expressive and receptive aphasia is very limiting to her.  She is not able to do oral care and is not eating well.  This is due to cognitive deficits, most likely.  Pt needs to have medications crushed for her.  Nursing to try timed toileting, since she cannot communicate her needs.  Pt with supervision goals overall for PT, but min assist for OT.  ST to still evaluate.   Revisions to Treatment Plan:    None    Continued Need for Acute Rehabilitation Level of Care: The patient requires daily medical management by a physician with specialized training in physical medicine and rehabilitation for the following conditions: Daily direction of a multidisciplinary physical rehabilitation program to ensure safe treatment while eliciting the highest outcome that is of practical value to the patient.: Yes Daily medical management of patient stability for increased activity during participation in an intensive rehabilitation regime.: Yes Daily analysis of laboratory values and/or radiology reports with any subsequent need for medication adjustment of medical intervention for : Neurological problems  Chaniece Barbato, Vista Deck 04/09/2014, 11:16 AM

## 2014-04-09 NOTE — Progress Notes (Signed)
Occupational Therapy Session Note  Patient Details  Name: SENAIDA KLINGLER MRN: [id number] Date of Birth: [birth year]  Today's Date: 04/09/2014 OT Individual Time: 0905-1005 OT Individual Time Calculation (min): 60 min    Short Term Goals: No short term goals set  Skilled Therapeutic Interventions/Progress Updates:    Pt seen for BADL retraining of self feeding, toileting, showering, dressing with a focus on motor planning, attention to task, use of RUE, balance, and activity tolerance.  Pt assisted with self feeding, needing hand over hand guiding at times. Pt had very minimal appetite and was not interested in eating much. With each bite, she needed cues to clear food off her tongue. She had difficulty moving her tongue. Pt guided to stand and walk to toilet with min A, pt was able to sit down and stand using grab bar with steady A. She then ambulated to shower with min A and guiding cues, pt showered with max A as she was not initiating using her hands to wash thoroughly. She stood in shower with steady A to wash perineal area. She then ambulated back to w/c in room to dress with max to total A as pt was having difficulty initiating.  Pt set up in w/c with quick release belt on. Nurse tech arrived to continue to assist pt with her breakfast.  Therapy Documentation Precautions:  Precautions Precautions: Fall Precaution Comments: pt is unstready on her feet. pt apparently knows how to turn off bed alarm Restrictions Weight Bearing Restrictions: No    Vital Signs: Therapy Vitals Pulse Rate: 60 BP: (!) 163/97 mmHg Pain: Pain Assessment Pain Assessment: Faces Faces Pain Scale: No hurt ADL: ADL ADL Comments: Refer to FIM  See FIM for current functional status  Therapy/Group: Individual Therapy  Zanita Millman 04/09/2014, 10:18 AM

## 2014-04-09 NOTE — Progress Notes (Addendum)
Physical Therapy Session Note  Patient Details  Name: Rebecca [name] MRN: [id number] Date of Birth: 01/12/1946  Today's Date: 04/09/2014 PT Individual Time: 0800-0900 PT Individual Time Calculation (min): 60 min   Short Term Goals: Week 1:  PT Short Term Goal 1 (Week 1): = LTGs due to LOS  Skilled Therapeutic Interventions/Progress Updates:  neuromuscular re-education via visual and gestural cues, VCs, manual cues for: -gait without AD x 90' with min assist, gait x100; throughout obstacle course of cones, with mod assist -up/down steps with self selected pattern, step to leading with LLE up, LLE down with resulting R knee buckling due to poor eccentric control, mod assist on 7" high steps; 2 rails -bil hand use in sitting and standing folding pillow cases and towels on table; good automatic use of R hand, some perseveration noted; biased in standing for increased RLE wt bearing  Gait velocity for 30 m timed walk test= 0.73'/sec; recurrent fall risk.  Applied quick release belt to pt in w/c, and returned pt to room where she was met by OT for next session.      Therapy Documentation Precautions:  Precautions Precautions: Fall Precaution Comments: pt is unstready on her feet. pt apparently knows how to turn off bed alarm Restrictions Weight Bearing Restrictions: No   Vital Signs: Therapy Vitals Pulse Rate: 60 BP: (!) 163/97 mmHg; RN had just given meds  Pain: Pain Assessment Pain Assessment: Faces Faces Pain Scale: No hurt   Locomotion : Ambulation Ambulation/Gait Assistance: 3: Mod assist     See FIM for current functional status  Therapy/Group: Individual Therapy  Evalise Abruzzese 04/09/2014, 10:56 AM

## 2014-04-09 NOTE — IPOC Note (Signed)
Overall Plan of Care [facility]) Patient Details Name: Rebecca [name] MRN: [id number] DOB: Dec 24, 1945  Admitting Diagnosis: L FRONTAL CVA  Hospital Problems: Principal Problem:   Cardioembolic stroke Active Problems:   Expressive aphasia   Fabry disease   Atrial fibrillation   Apraxia following CVA (cerebrovascular accident)     Functional Problem List: Nursing Behavior, Bladder, Bowel, Endurance, Nutrition  PT Balance, Edema, Endurance, Motor, Safety, Sensory  OT Balance, Cognition, Endurance, Motor, Perception  SLP Cognition, Nutrition, Safety, Linguistic  TR         Basic ADL's: OT Eating, Grooming, Bathing, Dressing, Toileting     Advanced  ADL's: OT       Transfers: PT Bed Mobility, Bed to Chair, Car, [employer], Civil Service fast streamer, Research scientist (life sciences): PT Ambulation, Psychologist, prison and probation services, Stairs     Additional Impairments: OT Fuctional Use of Upper Extremity  SLP Swallowing, Communication, Social Cognition comprehension, expression Problem Solving, Memory, Attention, Awareness  TR      Anticipated Outcomes Item Anticipated Outcome  Self Feeding supervision  Swallowing  max assist    Basic self-care  min A  Toileting  min A   Bathroom Transfers supervision stand pivot to toilet or tub bench  Bowel/Bladder  Min A  Transfers  supervision for basic and car  Locomotion  supervision x 150' gait and up/down 5 steps 2 rails  Communication  max assist   Cognition  max assist   Pain  <3  Safety/Judgment  Supervision   Therapy Plan: PT Intensity: Minimum of 1-2 x/day ,45 to 90 minutes PT Frequency: 5 out of 7 days PT Duration Estimated Length of Stay: 7-10 OT Intensity: Minimum of 1-2 x/day, 45 to 90 minutes OT Frequency: 5 out of 7 days OT Duration/Estimated Length of Stay: 7-10 days SLP Intensity: Minumum of 1-2 x/day, 30 to 90 minutes SLP Frequency: 5 out of 7 days SLP Duration/Estimated Length of Stay: 7-10 days        Team  Interventions: Nursing Interventions Patient/Family Education, Bladder Management, Bowel Management, Disease Management/Prevention, Discharge Planning, Skin Care/Wound Management  PT interventions Ambulation/gait training, Balance/vestibular training, Cognitive remediation/compensation, Discharge planning, DME/adaptive equipment instruction, Functional electrical stimulation, Functional mobility training, Patient/family education, Neuromuscular re-education, Psychosocial support, Splinting/orthotics, Therapeutic Exercise, Therapeutic Activities, Stair training, UE/LE Strength taining/ROM, UE/LE Coordination activities, Wheelchair propulsion/positioning  OT Interventions Balance/vestibular training, Cognitive remediation/compensation, Discharge planning, DME/adaptive equipment instruction, Functional mobility training, Neuromuscular re-education, Patient/family education, Self Care/advanced ADL retraining, Therapeutic Activities, Therapeutic Exercise, UE/LE Strength taining/ROM, UE/LE Coordination activities, Visual/perceptual remediation/compensation  SLP Interventions Cognitive remediation/compensation, Cueing hierarchy, Dysphagia/aspiration precaution training, Functional tasks, Patient/family education, Oral motor exercises, Multimodal communication approach, Internal/external aids, Environmental controls, Speech/Language facilitation  TR Interventions    SW/CM Interventions Discharge Planning, Psychosocial Support, Patient/Family Education    Team Discharge Planning: Destination: PT- (unknown at admission) ,OT- Skilled Nursing Facility (SNF) , SLP- (TBD) Projected Follow-up: PT-Home health PT, OT-  Skilled nursing facility, SLP-24 hour supervision/assistance, Skilled Nursing facility Projected Equipment Needs: PT-To be determined, OT- To be determined, SLP-None recommended by SLP Equipment Details: PT- , OT-  Patient/family involved in discharge planning: PT- Patient unable/family or caregiver not  available,  OT-Patient unable/family or caregiver not available (Pt is not verbal and her cognitive abilities are difficult to assess at this time.), SLP-Patient unable/family or caregive not available  MD ELOS: 11-15 d Medical Rehab Prognosis:  Good Assessment: 69 y.o. right handed female with history of Fabrys disease, diastolic congestive heart failure, hypertension, atrial fibrillation maintained  on Xarelto and low-dose aspirin with question of medical compliance, tobacco abuse, diabetes mellitus and peripheral neuropathy past CVA January 2015 and residual expressive aphasia. Independent prior to admission living with her son. 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 Bifrontal encephalomalacia further propagation on the right new on the left consistent with interval bilateral posterior cerebral artery territory infarct. MRA with decreased flow in left MCA. Carotid Dopplers with no ICA stenosis.   Now requiring 24/7 Rehab RN,MD, as well as CIR level PT, OT and SLP.  Treatment team will focus on ADLs and mobility with goals set at [facility] A/Sup  See Team Conference Notes for weekly updates to the plan of care

## 2014-04-10 ENCOUNTER — Inpatient Hospital Stay (HOSPITAL_COMMUNITY): Payer: Self-pay | Admitting: *Deleted

## 2014-04-10 ENCOUNTER — Inpatient Hospital Stay (HOSPITAL_COMMUNITY): Payer: Medicare Other

## 2014-04-10 ENCOUNTER — Other Ambulatory Visit (HOSPITAL_COMMUNITY): Payer: Self-pay | Admitting: Nephrology

## 2014-04-10 ENCOUNTER — Inpatient Hospital Stay (HOSPITAL_COMMUNITY): Payer: Medicare Other | Admitting: Speech Pathology

## 2014-04-10 ENCOUNTER — Encounter (HOSPITAL_COMMUNITY): Payer: Self-pay | Admitting: Occupational Therapy

## 2014-04-10 ENCOUNTER — Ambulatory Visit (HOSPITAL_COMMUNITY): Payer: Self-pay | Admitting: *Deleted

## 2014-04-10 ENCOUNTER — Encounter (HOSPITAL_COMMUNITY): Payer: Medicare Other

## 2014-04-10 ENCOUNTER — Inpatient Hospital Stay (HOSPITAL_COMMUNITY): Payer: Self-pay | Admitting: Occupational Therapy

## 2014-04-10 ENCOUNTER — Encounter (HOSPITAL_COMMUNITY): Admission: RE | Admit: 2014-04-10 | Payer: Medicare Other | Source: Ambulatory Visit

## 2014-04-10 DIAGNOSIS — R4702 Dysphasia: Secondary | ICD-10-CM

## 2014-04-10 DIAGNOSIS — E7521 Fabry (-Anderson) disease: Secondary | ICD-10-CM

## 2014-04-10 DIAGNOSIS — R482 Apraxia: Secondary | ICD-10-CM

## 2014-04-10 LAB — GLUCOSE, CAPILLARY
GLUCOSE-CAPILLARY: 68 mg/dL — AB (ref 70–99)
GLUCOSE-CAPILLARY: 86 mg/dL (ref 70–99)
Glucose-Capillary: 117 mg/dL — ABNORMAL HIGH (ref 70–99)
Glucose-Capillary: 69 mg/dL — ABNORMAL LOW (ref 70–99)
Glucose-Capillary: 74 mg/dL (ref 70–99)
Glucose-Capillary: 79 mg/dL (ref 70–99)
Glucose-Capillary: 85 mg/dL (ref 70–99)

## 2014-04-10 MED ORDER — ADULT MULTIVITAMIN LIQUID CH
5.0000 mL | Freq: Every day | ORAL | Status: DC
  Administered 2014-04-10 – 2014-05-01 (×20): 5 mL
  Filled 2014-04-10 (×25): qty 5

## 2014-04-10 MED ORDER — INSULIN ASPART 100 UNIT/ML ~~LOC~~ SOLN
0.0000 [IU] | SUBCUTANEOUS | Status: DC
  Administered 2014-04-11 (×2): 1 [IU] via SUBCUTANEOUS
  Administered 2014-04-11: 2 [IU] via SUBCUTANEOUS
  Administered 2014-04-11 – 2014-04-14 (×9): 1 [IU] via SUBCUTANEOUS
  Administered 2014-04-14: 2 [IU] via SUBCUTANEOUS
  Administered 2014-04-15 – 2014-04-18 (×2): 1 [IU] via SUBCUTANEOUS
  Administered 2014-04-22: 2 [IU] via SUBCUTANEOUS
  Administered 2014-04-23 (×2): 1 [IU] via SUBCUTANEOUS
  Administered 2014-04-23: 3 [IU] via SUBCUTANEOUS
  Administered 2014-04-24: 2 [IU] via SUBCUTANEOUS
  Administered 2014-04-24: 1 [IU] via SUBCUTANEOUS
  Administered 2014-04-24: 2 [IU] via SUBCUTANEOUS
  Administered 2014-04-25: 1 [IU] via SUBCUTANEOUS
  Administered 2014-04-25: 2 [IU] via SUBCUTANEOUS
  Administered 2014-04-25 (×2): 1 [IU] via SUBCUTANEOUS
  Administered 2014-04-25: 2 [IU] via SUBCUTANEOUS
  Administered 2014-04-26 – 2014-04-28 (×11): 1 [IU] via SUBCUTANEOUS
  Administered 2014-04-29: 2 [IU] via SUBCUTANEOUS
  Administered 2014-04-29: 1 [IU] via SUBCUTANEOUS
  Administered 2014-04-29 – 2014-04-30 (×3): 2 [IU] via SUBCUTANEOUS
  Administered 2014-05-01: 1 [IU] via SUBCUTANEOUS

## 2014-04-10 MED ORDER — JEVITY 1.2 CAL PO LIQD
1000.0000 mL | ORAL | Status: DC
Start: 2014-04-10 — End: 2014-04-15
  Administered 2014-04-10 – 2014-04-14 (×5): 1000 mL
  Filled 2014-04-10 (×15): qty 1000

## 2014-04-10 MED ORDER — METOPROLOL TARTRATE 25 MG/10 ML ORAL SUSPENSION
12.5000 mg | Freq: Two times a day (BID) | ORAL | Status: DC
  Administered 2014-04-10 – 2014-05-01 (×37): 12.5 mg
  Filled 2014-04-10 (×46): qty 5

## 2014-04-10 MED ORDER — CLONIDINE HCL 0.1 MG/24HR TD PTWK
0.1000 mg | MEDICATED_PATCH | TRANSDERMAL | Status: DC
  Administered 2014-04-10 – 2014-05-01 (×3): 0.1 mg via TRANSDERMAL
  Filled 2014-04-10 (×6): qty 1

## 2014-04-10 MED ORDER — GLUCOSE 40 % PO GEL
ORAL | Status: AC
  Administered 2014-04-10: 37.5 g
  Filled 2014-04-10: qty 1

## 2014-04-10 MED ORDER — FREE WATER
100.0000 mL | Freq: Every day | Status: DC
  Administered 2014-04-10 – 2014-04-15 (×24): 100 mL

## 2014-04-10 NOTE — Progress Notes (Signed)
Occupational Therapy Session Note  Patient Details  Name: Rebecca [name] MRN: [id number] Date of Birth: [birth year]  Today's Date: 04/10/2014 OT Individual Time: 1400-1500 OT Individual Time Calculation (min): 60 min    Skilled Therapeutic Interventions/Progress Updates:    Pt began session working on functional homemanagement tasks of vaccuming the floor and sorting clean dishes from the dishwasher.  Pt needed max demonstrational hand over hand assistance to perform all tasks.  Pt would assist with minimal parts of task but would not continue.  She needed hand over hand assistance to sort dishes and did not respond to demonstrational cueing that did not involve hand over hand.  Pt became more agitated throughout session as therapist would have to provide hand over hand cueing and physical guidance during transfers secondary to receptive difficulties.  Pt unable to respond to questions regarding location or reason for being in the hospital.  She did nod her head on two occasions "no" when therapist asked her if she lived in [place] and if she liked snow.  Min assist overall for standing balance and transfers throughout session.    Therapy Documentation Precautions:  Precautions Precautions: Fall Precaution Comments: pt is unstready on her feet. pt apparently knows how to turn off bed alarm Restrictions Weight Bearing Restrictions: No  Pain: Pain Assessment Pain Assessment: Faces Faces Pain Scale: No hurt ADL: ADL ADL Comments: Refer to FIM  See FIM for current functional status  Therapy/Group: Individual Therapy  Cloe Sockwell OTR/L 04/10/2014, 3:25 PM

## 2014-04-10 NOTE — Significant Event (Signed)
Hypoglycemic Event  CBG: 68  Treatment: 15 GM gel  Symptoms: None  Follow-up CBG: Time:0840 CBG Result:85  Possible Reasons for Event: Inadequate meal intake  Comments/MD notified:Kirstiens MD    [name], Rebecca Whitmyer Carol  Remember to initiate Hypoglycemia Order Set & complete 

## 2014-04-10 NOTE — Progress Notes (Signed)
Rebecca Ba09.6iAndrey Campanile70Marland KPhineas Realck [name][address] [id number] [id number] [id number] Bo Marlett DTEXTTAG>  ryl Harbor1EMarlandSherian ZO:XWRUHeide Spar ZO:XWRUHeide Spar >40981Marletta LorSherian MaroonDerryl Harbor72Edilia [phone] EvaElveria RisingTroy SinePrudy FeelerLupita Raider93Zonia [id number] Malachy ChamberGala [id number] Priest72Glade LloydRoyann [phone] HenriArgentina PonderCassandria SanteeVerne SpurrDoneen PoissonLennette BihariMearl Latin5[phone] Heide SparkZO:XWRUESentara Albemarle Medical CenterPearlean BrownieRenee Harder64Lynelle Smoke

## 2014-04-10 NOTE — Progress Notes (Addendum)
69 y.o. 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 Independent prior to admission living with her son. 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 Echocardiogram with ejection fraction of 50% no wall motion abnormalities. EEG negative for seizure. Patient did not receive TPA. Neurology consulted placed on Eliquis alone as there was some question to her medical compliance with Xarelto prior to admission.   Subjective/Complaints: Refusing meds Appetite poor  Remains severely aphasic/apraxic Review of Systems - cannot obtain secondary to aphasia  Objective: Vital Signs: Blood pressure 154/102, pulse 67, temperature 99.6 F (37.6 C), temperature source Oral, resp. rate 18, SpO2 99 %. No results found. Results for orders placed or performed during the hospital encounter of 04/07/14 (from the past 72 hour(s))  Glucose, capillary     Status: Abnormal   Collection Time: 04/07/14  8:01 PM  Result Value Ref Range   Glucose-Capillary 100 (H) 70 - 99 mg/dL  Glucose, capillary     Status: Abnormal   Collection Time: 04/08/14 12:04 AM  Result Value Ref Range   Glucose-Capillary 113 (H) 70 - 99 mg/dL  Glucose, capillary     Status: None   Collection Time: 04/08/14  3:50 AM  Result Value Ref Range   Glucose-Capillary 99 70 - 99 mg/dL  CBC WITH DIFFERENTIAL     Status: Abnormal   Collection Time: 04/08/14  7:30 AM  Result Value Ref Range   WBC 4.6 4.0 - 10.5 K/uL   RBC 4.64 3.87 - 5.11 MIL/uL   Hemoglobin 12.7 12.0 - 15.0 g/dL   HCT 38.7 36.0 - 46.0 %    MCV 83.4 78.0 - 100.0 fL   MCH 27.4 26.0 - 34.0 pg   MCHC 32.8 30.0 - 36.0 g/dL   RDW 15.0 11.5 - 15.5 %   Platelets 209 150 - 400 K/uL   Neutrophils Relative % 58 43 - 77 %   Neutro Abs 2.6 1.7 - 7.7 K/uL   Lymphocytes Relative 28 12 - 46 %   Lymphs Abs 1.3 0.7 - 4.0 K/uL   Monocytes Relative 14 (H) 3 - 12 %   Monocytes Absolute 0.6 0.1 - 1.0 K/uL   Eosinophils Relative 0 0 - 5 %   Eosinophils Absolute 0.0 0.0 - 0.7 K/uL   Basophils Relative 0 0 - 1 %   Basophils Absolute 0.0 0.0 - 0.1 K/uL  Comprehensive metabolic panel     Status: Abnormal   Collection Time: 04/08/14  7:30 AM  Result Value Ref Range   Sodium 144 135 - 145 mmol/L    Comment: Please note change in reference range.   Potassium 4.1 3.5 - 5.1 mmol/L    Comment: Please note change in reference range.   Chloride 104 96 - 112 mEq/L   CO2 27 19 - 32 mmol/L   Glucose, Bld 97 70 - 99 mg/dL   BUN 26 (H) 6 - 23 mg/dL   Creatinine, Ser 1.00 0.50 - 1.10 mg/dL   Calcium 9.5 8.4 - 10.5 mg/dL   Total Protein 7.5 6.0 - 8.3 g/dL   Albumin 3.6 3.5 - 5.2 g/dL   AST 35 0 - 37  U/L   ALT 19 0 - 35 U/L   Alkaline Phosphatase 37 (L) 39 - 117 U/L   Total Bilirubin 0.9 0.3 - 1.2 mg/dL   GFR calc non Af Amer 56 (L) >90 mL/min   GFR calc Af Amer 65 (L) >90 mL/min    Comment: (NOTE) The eGFR has been calculated using the CKD EPI equation. This calculation has not been validated in all clinical situations. eGFR's persistently <90 mL/min signify possible Chronic Kidney Disease.    Anion gap 13 5 - 15  Glucose, capillary     Status: Abnormal   Collection Time: 04/08/14  7:35 AM  Result Value Ref Range   Glucose-Capillary 116 (H) 70 - 99 mg/dL  Glucose, capillary     Status: Abnormal   Collection Time: 04/08/14 12:56 PM  Result Value Ref Range   Glucose-Capillary 103 (H) 70 - 99 mg/dL  Glucose, capillary     Status: Abnormal   Collection Time: 04/08/14  4:35 PM  Result Value Ref Range   Glucose-Capillary 108 (H) 70 - 99 mg/dL    Comment 1 Notify RN   Glucose, capillary     Status: None   Collection Time: 04/08/14  8:31 PM  Result Value Ref Range   Glucose-Capillary 97 70 - 99 mg/dL  Glucose, capillary     Status: Abnormal   Collection Time: 04/08/14 11:53 PM  Result Value Ref Range   Glucose-Capillary 103 (H) 70 - 99 mg/dL  Glucose, capillary     Status: None   Collection Time: 04/09/14  4:31 AM  Result Value Ref Range   Glucose-Capillary 87 70 - 99 mg/dL  Glucose, capillary     Status: None   Collection Time: 04/09/14  7:50 AM  Result Value Ref Range   Glucose-Capillary 89 70 - 99 mg/dL   Comment 1 Notify RN   Glucose, capillary     Status: Abnormal   Collection Time: 04/09/14 12:11 PM  Result Value Ref Range   Glucose-Capillary 105 (H) 70 - 99 mg/dL   Comment 1 Notify RN   Glucose, capillary     Status: None   Collection Time: 04/09/14  4:35 PM  Result Value Ref Range   Glucose-Capillary 88 70 - 99 mg/dL  Glucose, capillary     Status: None   Collection Time: 04/09/14  8:30 PM  Result Value Ref Range   Glucose-Capillary 74 70 - 99 mg/dL     HEENT: normal Cardio: RRR and no murmurs Resp: CTA B/L and unlabored GI: BS positive and nontender nondistended Extremity:  Pulses positive and No Edema Skin:   Intact Neuro: Alert/Oriented, Abnormal Sensory unable to assess secondary to aphasia and Abnormal Motor 4/5 bilateral upper and lower extremities Musc/Skel:  Other no pain with upper or lower extremity active range of motion, no joint swelling noted Gen. no acute distress   Assessment/Plan: 1. 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 Physiatrist is providing close team supervision and 24 hour management of active medical problems listed below. Physiatrist and rehab team continue to assess barriers to discharge/monitor patient progress toward  functional and medical goals. FIM: FIM - Bathing Bathing Steps Patient Completed: Right upper leg, Left upper leg, Front perineal area, Abdomen Bathing: 2: Max-Patient completes 3-4 43f10 parts or 25-49%  FIM - Upper Body Dressing/Undressing Upper body dressing/undressing steps patient completed: Thread/unthread left sleeve of  pullover shirt/dress Upper body dressing/undressing: 1: Total-Patient completed less than 25% of tasks FIM - Lower Body Dressing/Undressing Lower body dressing/undressing steps patient completed: Pull pants up/down Lower body dressing/undressing: 1: Total-Patient completed less than 25% of tasks  FIM - Toileting Toileting: 1: Total-Patient completed zero steps, helper did all 3  FIM - Radio producer Devices: Grab bars Toilet Transfers: 4-To toilet/BSC: Min A (steadying Pt. > 75%), 4-From toilet/BSC: Min A (steadying Pt. > 75%)  FIM - Bed/Chair Transfer Bed/Chair Transfer Assistive Devices: HOB elevated, Arm rests Bed/Chair Transfer: 5: Supine > Sit: Supervision (verbal cues/safety issues), 5: Bed > Chair or W/C: Supervision (verbal cues/safety issues)  FIM - Locomotion: Wheelchair Distance: 15 Locomotion: Wheelchair: 1: Total Assistance/staff pushes wheelchair (Pt<25%) FIM - Locomotion: Ambulation Locomotion: Ambulation Assistive Devices: Other (comment) (none) Ambulation/Gait Assistance: 3: Mod assist Locomotion: Ambulation: 2: Travels 50 - 149 ft with moderate assistance (Pt: 50 - 74%)  Comprehension Comprehension Mode: Auditory Comprehension: 2-Understands basic 25 - 49% of the time/requires cueing 51 - 75% of the time  Expression Expression Mode: Nonverbal Expression: 1-Expresses basis less than 25% of the time/requires cueing greater than 75% of the time.  Social Interaction Social Interaction Mode: Asleep Social Interaction: 2-Interacts appropriately 25 - 49% of time - Needs frequent redirection.  Problem  Solving Problem Solving Mode: Asleep Problem Solving: 2-Solves basic 25 - 49% of the time - needs direction more than half the time to initiate, plan or complete simple activities  Memory Memory Mode: Not assessed Memory: 1-Recognizes or recalls less than 25% of the time/requires cueing greater than 75% of the time  Medical Problem List and Plan: 1. 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  DVT Prophylaxis/Anticoagulation: Eliquis. Monitor for any bleeding episodes 3. Pain Management: Tylenol as needed   4. Dysphagia.[name] Dysphagia 1 nectar liquids. Follow-up speech therapy 5. Neuropsych: This patient is not capable of making decisions on her own behalf. 6. Skin/Wound Care: Routine skin checks 7. Fluids/Electrolytes/Nutrition: Strict I and O follow-up chemistries 8. Atrial fibrillation/hypertension. Presently on Eliquis and low-dose Lopressor. Follow-up cardiology services. Cardiac rate control.(Patient on lisinopril 5 mg daily and hydrochlorothiazide 25 mg daily prior to admission and will resume as tolerated) 9. Mood/depression. Lexapro 5 mg daily. Provide emotional support 10. Hyperlipidemia. Lipitor 11. Diabetes mellitus with peripheral neuropathy. Hemoglobin A1c 6.0. Check blood sugars before meals and at bedtime 12. Acute on chronic diastolic congestive heart failure. Monitor for any signs of fluid overload 13. Tobacco abuse. Counseling 14.  Poor intake cognitive based,minimal caloric and fluid intake,  place feeding tube, per RN taking a couple hours to cajole pt into taking meds 15.  Fabry's disease hold off on Fabrazyme based on her current hemodynamic status LOS (Days) 3 A FACE TO FACE EVALUATION WAS PERFORMED  Rebecca [name] E 04/10/2014, 7:03 AM

## 2014-04-10 NOTE — Progress Notes (Signed)
Speech Language Pathology Daily Session Notes  Patient Details  Name: Rebecca [name] MRN: [id number] Date of Birth: [birth year]  Today's Date: 04/10/2014  Session 1 SLP Individual Time: 1500-1530 SLP Individual Time Calculation (min): 30 min Session 2 SLP Individual Time: 1130-1200 SLP Individual Time Calculation (min): 30 min  Short Term Goals: Week 1: SLP Short Term Goal 1 (Week 1): Pt will initiate a timely swallow response over 50% of observable opportunities during presentations of her currently prescribed diet with max assist multimodal cuing.   SLP Short Term Goal 2 (Week 1): Pt will focus attention to verbal/auditory stimulation during structured therapeutic tasks for 15-30 seconds with max assist.   SLP Short Term Goal 3 (Week 1): Pt will follow 1 step commands for 25-50% accuracy with max assist multimodal cuing.  SLP Short Term Goal 4 (Week 1): Pt will improve functional communication via any modality (i.e. gestures, pointing, facial expressions, vocalization) during structured tasks with max assist multimodal cuing.   Skilled Therapeutic Interventions: Session 1 Skilled treatment session focused on addressing dysphagia goals.  SLP attempted to facilitate session with hand-over-hand assist for self-feeding; however patient resistant to this cuing and as a result, cues were faded to Max assist multimodal cues to self-feeding lunch of Dys.1 textures with refusal of nectar-thick liquids and thin liquids via cup.  Patient consumed 4 small bites with increased time and Max multimodal cues to sustain attention to task with swallow initiated following first two bites then patient attempted three bites without swallow initiation and SLP needed to remove spoon until swallow was initiated for safety.  Max multimodal cuing and dry spoon were attempted but patient never initiated swallow and as a result, patient required Total assist with oral swab to remove oral residue.  RN made aware.       Session 2 Skilled treatment session focused on addressing multimodal communication goals.  SLP facilitated session with Max multimodal cues within the context of a task for yes/no questions to express basic needs/wants.  Patient appeared to perseverate on head nod no with closing eyes and turning head away from therapist, until biographical questions that are more obvious in nature such as "Is your name Kortne?" were asked.  Patient then mouthed yes and nodded head yes.  Patient also able to write first name with encouragement and increased time with 80% accuracy but was unable to write last name SLP attempted verbal and written cues but when cues were initiated patient refused to participate further by closing eyes and turning head away from SLP. Session ended with Max multimodal cues to follow 1-step commands during transfer from chair to bed.  Continue with current plan of care in hopes of patient participating in familiar therapeutic activities.    FIM:  Comprehension Comprehension Mode: Auditory Comprehension: 2-Understands basic 25 - 49% of the time/requires cueing 51 - 75% of the time Expression Expression Mode: Nonverbal Expression: 1-Expresses basis less than 25% of the time/requires cueing greater than 75% of the time. Social Interaction Social Interaction: 2-Interacts appropriately 25 - 49% of time - Needs frequent redirection. Problem Solving Problem Solving: 1-Solves basic less than 25% of the time - needs direction nearly all the time or does not effectively solve problems and may need a restraint for safety Memory Memory: 1-Recognizes or recalls less than 25% of the time/requires cueing greater than 75% of the time FIM - Eating Eating Activity: 2: Hand over hand assist  Pain Pain Assessment Pain Assessment: No/denies pain Faces Pain Scale: No hurt  x2  Therapy/Group: Individual Therapy x2  Charlane Ferretti., CCC-SLP [phone]  Rebecca [name] 04/10/2014, 5:43 PM

## 2014-04-10 NOTE — Progress Notes (Signed)
INITIAL NUTRITION ASSESSMENT  DOCUMENTATION CODES Per approved criteria  -Not Applicable   INTERVENTION: Initiate Jevity 1.2 @ 20 ml/hr via NGT and increase by 10 ml every 4 hours to goal rate of 70 ml/hr. TF should infuse at 70 ml/hr for 20 hours daily.   Tube feeding regimen provides 1680 kcal (100% of needs), 78 grams of protein, and 1130 ml of H2O.   Provide 100 ml free water flushes via NGT 5 times daily.    NUTRITION DIAGNOSIS: Inadequate oral intake related to dysphagia and poor appetite as evidenced by <25% meal completion.   Goal: Pt to meet >/= 90% of their estimated nutrition needs   Monitor:  TF initiation/tolerance, weight trend, labs  Reason for Assessment: Consult for Tube Feeding Management  69 y.o. female  Admitting Dx: Cardioembolic stroke  ASSESSMENT: 69 y.o. right handed female with history of Fabrys disease, diastolic congestive heart failure, hypertension, atrial fibrillation maintained on Xarelto and low-dose aspirin with question of medical compliance, tobacco abuse, diabetes mellitus and peripheral neuropathy past CVA January 2015 and residual expressive aphasia. Presented 04/01/2014 with worsening aphasia and right-sided weakness as well as respiratory compromise.  Per nursing notes, pt is eating 0-10% of most meals, 25% of a couple. Weight history shows pt has lost 23 lbs since the beginning of December- 15% weight loss in less than 2 months is severe for time frame. She has lost 26% of her body weight in the past year.  Pt nonverbal. Per RN pt has bee refusing most PO's and will need all of nutrition via NGT. Pt has NGT in place.   Labs: Glucose ranging 68 to 116 mg/dL, decreased GFR  Height: Ht Readings from Last 1 Encounters:  04/02/14 5\' 4"  (1.626 m)    Weight: Wt Readings from Last 1 Encounters:  04/05/14 129 lb 3.2 oz (58.605 kg)    Ideal Body Weight: 120 lbs  % Ideal Body Weight: 107%  Wt Readings from Last 10 Encounters:   04/05/14 129 lb 3.2 oz (58.605 kg)  02/19/14 152 lb (68.947 kg)  12/24/13 150 lb (68.04 kg)  11/07/13 158 lb (71.668 kg)  05/01/13 175 lb (79.379 kg)  03/23/13 172 lb (78.019 kg)    Usual Body Weight: 175 lbs  % Usual Body Weight: 74%  BMI:  Body Mass Index of 22.1 kg/(m^2)  Estimated Nutritional Needs: Kcal: 1500-1750 Protein: 70-80 grams Fluid: 1.5-1.7 L/day  Skin: +1 RLE and LLE edema  Diet Order: DIET - DYS 1, nectar-thick  EDUCATION NEEDS: -No education needs identified at this time   Intake/Output Summary (Last 24 hours) at 04/10/14 0946 Last data filed at 04/10/14 0800  Gross per 24 hour  Intake  987.5 ml  Output      0 ml  Net  987.5 ml    Last BM: 1/21   Labs:   Recent Labs Lab 04/03/14 1005 04/05/14 0305 04/06/14 0359 04/07/14 0306 04/08/14 0730  NA 141  --   --  144 144  K 4.0  --   --  3.9 4.1  CL 102  --   --  105 104  CO2 29  --   --  31 27  BUN 18  --   --  20 26*  CREATININE 1.03  --   --  0.90 1.00  CALCIUM 9.1  --   --  8.9 9.5  MG 1.8 2.0 2.0  --   --   PHOS  --  3.6 4.1  --   --  GLUCOSE 117*  --   --  96 97    CBG (last 3)   Recent Labs  04/09/14 2030 04/10/14 0710 04/10/14 0840  GLUCAP 74 68* 85    Scheduled Meds: . antiseptic oral rinse  7 mL Mouth Rinse q12n4p  . apixaban  5 mg Oral BID  . atorvastatin  40 mg Oral q1800  . chlorhexidine  15 mL Mouth Rinse BID  . cloNIDine  0.1 mg Transdermal Weekly  . escitalopram  5 mg Oral Daily  . insulin aspart  0-9 Units Subcutaneous TID AC  . loratadine  10 mg Oral Daily  . megestrol  400 mg Oral BID  . metoprolol tartrate  12.5 mg Oral BID  . multivitamin with minerals  1 tablet Oral Daily    Continuous Infusions: . sodium chloride Stopped (04/10/14 9604)    Past Medical History  Diagnosis Date  . Hypertension   . 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 Expressive aphasia 03/23/2013  . Atrial fibrillation 05/01/2013  . Tobacco abuse 05/01/2013  . Diabetes mellitus without  complication   . Depression   . Stroke     2015    Past Surgical History  Procedure Laterality Date  . Tee without cardioversion N/A 03/26/2013    Procedure: TRANSESOPHAGEAL ECHOCARDIOGRAM (TEE);  Surgeon: Thurmon Fair, MD;  Location: [facility] ENDOSCOPY;  Service: Cardiovascular;  Laterality: N/A;  . Cesarean section      x3  . Hip replacement Right 1990's  . Hip replacemet Left 1990's   Ian Malkin RD, LDN Inpatient Clinical Dietitian Pager: [phone] After Hours Pager: [phone]

## 2014-04-10 NOTE — Progress Notes (Signed)
Patient refusing scheduled medications at this time: megace, peridex, eliquis, lopressor. BP 148/82, pulse 62. Per report, patient does better with more rest and was just put in bed before shift change.

## 2014-04-10 NOTE — Progress Notes (Signed)
Patient accepted eliquis and lopressor medications at this time while at nurses station. BP 182/102, pulse 64. Will continue to monitor.

## 2014-04-11 ENCOUNTER — Inpatient Hospital Stay (HOSPITAL_COMMUNITY): Payer: Medicare Other | Admitting: *Deleted

## 2014-04-11 ENCOUNTER — Ambulatory Visit (HOSPITAL_COMMUNITY): Payer: Self-pay | Admitting: Speech Pathology

## 2014-04-11 ENCOUNTER — Inpatient Hospital Stay (HOSPITAL_COMMUNITY): Payer: Medicare Other | Admitting: Occupational Therapy

## 2014-04-11 LAB — GLUCOSE, CAPILLARY
GLUCOSE-CAPILLARY: 149 mg/dL — AB (ref 70–99)
Glucose-Capillary: 131 mg/dL — ABNORMAL HIGH (ref 70–99)
Glucose-Capillary: 145 mg/dL — ABNORMAL HIGH (ref 70–99)
Glucose-Capillary: 168 mg/dL — ABNORMAL HIGH (ref 70–99)
Glucose-Capillary: 135 mg/dL — ABNORMAL HIGH (ref 70–99)

## 2014-04-11 MED ORDER — TRAZODONE HCL 50 MG PO TABS: 50.0000 mg | ORAL_TABLET | Freq: Every evening | ORAL | Status: DC | PRN

## 2014-04-11 NOTE — Progress Notes (Signed)
Physical Therapy Session Note  Patient Details  Name: Rebecca [name] MRN: [id number] Date of Birth: [birth year]  Today's Date: 04/11/2014 PT Co-Treatment Time: 10:30-1100; 30 Min Total time in session with Pt and SLP: 60 min (1000-1100)   Short Term Goals: Week 1:  PT Short Term Goal 1 (Week 1): = LTGs due to LOS  Skilled Therapeutic Interventions/Progress Updates:  Pt received in reclining w/c with NG tube in place and running. Pt nonverbal throughout session, but occasionally nods head or shakes head. Pt mostly resistant to therapy session and has eyes closed during session.     Gait Training: 2nd assist for IV pole management due to NG tube running PT instructs pt in ambulation x 35' with HHA progressing to one hand hold and one furniture walking x 35' req mod A and weight shifting for balance.  PT instructs pt in ambulating to the toilet x 60' with HHA req min-mod A with facilitation at arms for trunk rotation and weight shift (PT in front and facing pt).  PT instructs pt in ambulating to the recliner x 25' with B HHA req min-mod A with facilitation at arms for weight shift/trunk rotation (PT behind pt with arms to the side of pt).   Therapeutic Activity: PT instructs pt in recliner to loveseat transfer req min A to stand and step transfer with HHA.  PT instructs pt in placing dirty linens from recliner into laundry basket, doffing dirty pillow case, and donning clean pillow case req max A for activity, but min A for dynamic standing balance.  PT instructs pt in toilet transfer and pt req tot A to change brief soiled with urine and for perineal hygiene with washcloth - pt does attempt anterior and posterior hygiene in stand req min A.   Pt demonstrates poor participation during PT session and req max encouragement to participate in activities. Pt has relatively good standing balance, but low activity tolerance, and low initiation to participate in activities that are not meaningful to  pt. PT attempted to find out what pt's interests were to target therapy to include this, but pt only shrugged shoulders with PT questions. Pt left up in recliner with quick-release belt in place at RN station and NG tube intact and plugged in. Continue per PT POC.   Therapy Documentation Precautions:  Precautions Precautions: Fall Precaution Comments: pt is unstready on her feet. pt apparently knows how to turn off bed alarm Restrictions Weight Bearing Restrictions: No   Pain: Pain Assessment Pain Assessment: No/denies pain  See FIM for current functional status  Therapy/Group: Co-Treatment with Joni Reining, SLP  [facility] M 04/11/2014, 10:25 AM

## 2014-04-11 NOTE — Progress Notes (Signed)
Speech Language Pathology Daily Session Note  Patient Details  Name: MARYANNA DUDIK MRN: [id number] Date of Birth: 06-May-1945  Today's Date: 04/11/2014 SLP Co-Treatment Time: 1000-1030 SLP Co-Treatment Time Calculation (min): 30 min  Short Term Goals: Week 1: SLP Short Term Goal 1 (Week 1): Pt will initiate a timely swallow response over 50% of observable opportunities during presentations of her currently prescribed diet with max assist multimodal cuing.   SLP Short Term Goal 2 (Week 1): Pt will focus attention to verbal/auditory stimulation during structured therapeutic tasks for 15-30 seconds with max assist.   SLP Short Term Goal 3 (Week 1): Pt will follow 1 step commands for 25-50% accuracy with max assist multimodal cuing.  SLP Short Term Goal 4 (Week 1): Pt will improve functional communication via any modality (i.e. gestures, pointing, facial expressions, vocalization) during structured tasks with max assist multimodal cuing.   Skilled Therapeutic Interventions:    Pt was seen for skilled ST/PT co-treatment targeting cognitive goals while engaged in functional mobility tasks.  See PT note for specific goals and interventions.  Upon arrival, pt was seated in recliner at RN station with eyes closed and required mod encouragement for participation in therapy.  SLP facilitated the session with a structured kitchen sorting task targeting sustained attention, initiation, and functional problem solving, during which pt required max-total assist which SLP suspects was primarily behavioral in nature due to decreased motivation to participate in structured therapy.  Pt was noted with improved engagement in tasks (i.e. Eyes opened, responding to questions appropriately via head nod/shake, shoulder shrug, smiling in response to humor) when they were spontaneous, familiar, and functional in nature (i.e. Changing a soiled brief and completing toileting/self care tasks, replacing dirty linens with clean  ones).  During the aforementioned tasks, pt benefited from direct mod assist multimodal cues for sequencing and initiation and was compliant for participation in therapy.  Continue per current plan of care.    FIM:  Comprehension Comprehension Mode: Auditory Comprehension: 2-Understands basic 25 - 49% of the time/requires cueing 51 - 75% of the time Expression Expression Mode: Nonverbal Expression: 1-Expresses basis less than 25% of the time/requires cueing greater than 75% of the time. Social Interaction Social Interaction: 2-Interacts appropriately 25 - 49% of time - Needs frequent redirection. Problem Solving Problem Solving: 2-Solves basic 25 - 49% of the time - needs direction more than half the time to initiate, plan or complete simple activities Memory Memory: 1-Recognizes or recalls less than 25% of the time/requires cueing greater than 75% of the time  Pain Pain Assessment Pain Assessment: No/denies pain  Therapy/Group: Other: PT/ST co-treatment   Bedford Winsor, Melanee Spry 04/11/2014, 6:45 PM

## 2014-04-11 NOTE — Progress Notes (Signed)
Occupational Therapy Session Note  Patient Details  Name: NZINGHA PIRAINO MRN: [id number] Date of Birth: 09-Feb-1946  Today's Date: 04/11/2014 OT Individual Time: 0800-0900 OT Individual Time Calculation (min): 60 min    Short Term Goals: Week 1:      Skilled Therapeutic Interventions/Progress Updates:  Pt received in day room with breakfast tray. Pt shakes head "no" when asked if she is hurting. No signs or symptoms of pain during session. Pt able to grasp spoon but in very immature gross grasp that turned spoon incorrectly which did not allow pt to place food into mouth. Pt required [facility] assist for utensil use with pt demonstrating the ability to scoop food 75% of the time onto utensil. When eating magic cup, pt held cup in L hand while holding utensil in R.  Pt required verbal cues for small bites and to swallow what was in mouth before taking another bite. Pt assisted back to room for set up grooming to wash face and hands after meal. Pt assisted in recliner chair with QRB donned back to RN station for supervision.  Therapy Documentation Precautions:  Precautions Precautions: Fall Precaution Comments: pt is unstready on her feet. pt apparently knows how to turn off bed alarm Restrictions Weight Bearing Restrictions: No  See FIM for current functional status  Therapy/Group: Individual Therapy  Lowella Grip 04/11/2014, 6:30 PM

## 2014-04-11 NOTE — Progress Notes (Signed)
Occupational Therapy Session Note  Patient Details  Name: Rebecca [name] MRN: [id number] Date of Birth: 02-15-1946  Today's Date: 04/11/2014 OT Individual Time: 1515-1615  (60 min)  OT Individual Time Calculation (min): 60 min    Short Term Goals: Week 1:     Skilled Therapeutic Interventions/Progress Updates:    TX focused on UE AROM, PROM.   [name] Pt needing max encouragement to participate.  Provided max stimulation and multimodal cues. Pt. Performed exercises but kept eyes closes the entire time.    BP=  107/65, 62.    Left pt in bed with all needs in reach.   Therapy Documentation Precautions:  Precautions Precautions: Fall Precaution Comments: pt is unstready on her feet. pt apparently knows how to turn off bed alarm Restrictions Weight Bearing Restrictions: No     Pain:  None indicated   ADL: ADL ADL Comments: Refer to FIM        See FIM for current functional status  Therapy/Group: Individual Therapy  Humberto Seals 04/11/2014, 3:27 PM

## 2014-04-11 NOTE — Progress Notes (Addendum)
69 y.o. 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 Independent prior to admission living with her son. 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 Echocardiogram with ejection fraction of 50% no wall motion abnormalities. EEG negative for seizure. Patient did not receive TPA. Neurology consulted placed on Eliquis alone as there was some question to her medical compliance with Xarelto prior to admission.   Subjective/Complaints: Up last night. Remains confused and severely aphasic/apraxic Review of Systems - cannot obtain secondary to aphasia  Objective: Vital Signs: Blood pressure 150/94, pulse 92, temperature 98 F (36.7 C), temperature source Oral, resp. rate 20, SpO2 100 %. Dg Abd Portable 1v  04/10/2014   CLINICAL DATA:  Feeding tube placement.  EXAM: PORTABLE ABDOMEN - 1 VIEW  COMPARISON:  None.  FINDINGS: A feeding tube is been placed with the tip located at the level of the mid body of the stomach. There is some contrast in the colon. No evidence of bowel obstruction.  IMPRESSION: Feeding tube tip is located at the level of the mid body of the stomach.   Electronically Signed   By: Irish Lack M.D.   On: 04/10/2014 09:36   Results for orders placed or performed during the hospital encounter of 04/07/14 (from the past 72 hour(s))  Glucose, capillary     Status: Abnormal   Collection Time: 04/08/14  4:35 PM  Result Value Ref Range   Glucose-Capillary 108 (H) 70 - 99 mg/dL   Comment 1 Notify RN   Glucose, capillary     Status: None   Collection Time: 04/08/14  8:31 PM  Result Value Ref Range   Glucose-Capillary 97 70 - 99 mg/dL  Glucose, capillary     Status: Abnormal   Collection Time: 04/08/14 11:53 PM  Result Value Ref Range   Glucose-Capillary 103 (H) 70 - 99 mg/dL  Glucose, capillary     Status: None   Collection Time: 04/09/14  4:31 AM  Result Value Ref Range   Glucose-Capillary 87 70 - 99 mg/dL  Glucose, capillary     Status: None   Collection Time: 04/09/14  7:50 AM  Result Value Ref Range   Glucose-Capillary 89 70 - 99 mg/dL   Comment 1 Notify RN   Glucose, capillary     Status: Abnormal   Collection Time: 04/09/14 12:11 PM  Result Value Ref Range   Glucose-Capillary 105 (H) 70 - 99 mg/dL   Comment 1 Notify RN   Glucose, capillary     Status: None   Collection Time: 04/09/14  4:35 PM  Result Value Ref Range   Glucose-Capillary 88 70 - 99 mg/dL  Glucose, capillary     Status: None   Collection Time: 04/09/14  8:30 PM  Result Value Ref Range   Glucose-Capillary 74 70 - 99 mg/dL  Glucose, capillary     Status: Abnormal   Collection Time: 04/10/14  7:10 AM  Result Value Ref Range   Glucose-Capillary 68 (L) 70 - 99 mg/dL  Glucose, capillary     Status: None   Collection Time: 04/10/14  8:40 AM  Result Value Ref Range   Glucose-Capillary 85 70 - 99  mg/dL  Glucose, capillary     Status: Abnormal   Collection Time: 04/10/14 11:33 AM  Result Value Ref Range   Glucose-Capillary 69 (L) 70 - 99 mg/dL   Comment 1 Notify RN   Glucose, capillary     Status: None   Collection Time: 04/10/14 12:29 PM  Result Value Ref Range   Glucose-Capillary 79 70 - 99 mg/dL  Glucose, capillary     Status: None   Collection Time: 04/10/14  3:59 PM  Result Value Ref Range   Glucose-Capillary 86 70 - 99 mg/dL  Glucose, capillary     Status: None   Collection Time: 04/10/14  8:15 PM  Result Value Ref Range   Glucose-Capillary 74 70 - 99 mg/dL  Glucose, capillary     Status: Abnormal   Collection Time: 04/10/14 11:59  PM  Result Value Ref Range   Glucose-Capillary 117 (H) 70 - 99 mg/dL   Comment 1 Notify RN   Glucose, capillary     Status: Abnormal   Collection Time: 04/11/14  4:07 AM  Result Value Ref Range   Glucose-Capillary 131 (H) 70 - 99 mg/dL  Glucose, capillary     Status: Abnormal   Collection Time: 04/11/14  8:06 AM  Result Value Ref Range   Glucose-Capillary 145 (H) 70 - 99 mg/dL  Glucose, capillary     Status: Abnormal   Collection Time: 04/11/14 12:02 PM  Result Value Ref Range   Glucose-Capillary 149 (H) 70 - 99 mg/dL   Comment 1 Notify RN      HEENT: normal Cardio: RRR and no murmurs Resp: CTA B/L and unlabored GI: BS positive and nontender nondistended Extremity:  Pulses positive and No Edema Skin:   Intact Neuro: Alert/Oriented, Abnormal Sensory unable to assess secondary to aphasia and Abnormal Motor 4/5 bilateral upper and lower extremities Musc/Skel:  Other no pain with upper or lower extremity active range of motion, no joint swelling noted Gen. no acute distress   Assessment/Plan: 1. 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 Physiatrist is providing close team supervision and 24 hour management of active medical problems listed below. Physiatrist and rehab team continue to assess barriers to discharge/monitor patient progress toward functional and medical goals. FIM: FIM - Bathing Bathing Steps Patient Completed: Right upper leg, Left upper leg, Front perineal area, Abdomen Bathing: 2: Max-Patient completes 3-4 60f 10 parts or 25-49%  FIM - Upper Body Dressing/Undressing Upper body dressing/undressing steps patient completed: Thread/unthread left sleeve of pullover shirt/dress Upper body dressing/undressing: 1: Total-Patient completed less than 25% of tasks FIM - Lower Body Dressing/Undressing Lower body  dressing/undressing steps patient completed: Pull pants up/down Lower body dressing/undressing: 1: Total-Patient completed less than 25% of tasks  FIM - Toileting Toileting: 1: Two helpers  FIM - Diplomatic Services operational officer Devices: Psychiatrist Transfers: 1-Two helpers (2nd assist for IV pole with NG tube)  FIM - Banker Devices: Arm rests Bed/Chair Transfer: 4: Chair or W/C > Bed: Min A (steadying Pt. > 75%)  FIM - Locomotion: Wheelchair Distance: 15 Locomotion: Wheelchair: 0: Activity did not occur FIM - Locomotion: Ambulation Locomotion: Ambulation Assistive Devices: Other (comment) (hand hold assist) Ambulation/Gait Assistance: 3: Mod assist (min-mod A for balance) Locomotion: Ambulation: 2: Travels 50 - 149 ft with moderate assistance (Pt: 50 - 74%)  Comprehension Comprehension Mode: Auditory Comprehension: 2-Understands  basic 25 - 49% of the time/requires cueing 51 - 75% of the time  Expression Expression Mode: Nonverbal Expression: 1-Expresses basis less than 25% of the time/requires cueing greater than 75% of the time.  Social Interaction Social Interaction Mode: Asleep Social Interaction: 2-Interacts appropriately 25 - 49% of time - Needs frequent redirection.  Problem Solving Problem Solving Mode: Asleep Problem Solving: 1-Solves basic less than 25% of the time - needs direction nearly all the time or does not effectively solve problems and may need a restraint for safety  Memory Memory Mode: Not assessed Memory: 1-Recognizes or recalls less than 25% of the time/requires cueing greater than 75% of the time  Medical Problem List and Plan: 1. 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  DVT Prophylaxis/Anticoagulation: Eliquis. Monitor for any bleeding episodes 3. Pain Management: Tylenol as needed   4. Dysphagia.[name] Dysphagia 1 nectar  liquids. Follow-up speech therapy 5. Neuropsych: This patient is not capable of making decisions on her own behalf.  -?sleep aid 6. Skin/Wound Care: Routine skin checks 7. Fluids/Electrolytes/Nutrition: Strict I and O follow-up chemistries 8. Atrial fibrillation/hypertension. Presently on Eliquis and low-dose Lopressor. Follow-up cardiology services. Cardiac rate control.(Patient on lisinopril 5 mg daily and hydrochlorothiazide 25 mg daily prior to admission and will resume as tolerated) 9. Mood/depression. Lexapro 5 mg daily. Provide emotional support 10. Hyperlipidemia. Lipitor 11. Diabetes mellitus with peripheral neuropathy. Hemoglobin A1c 6.0.   -sugars better with initiation of TF 12. Acute on chronic diastolic congestive heart failure. Monitor for any signs of fluid overload 13. Tobacco abuse. Counseling 14.  Poor intake cognitive based,minimal caloric and fluid intake-  -TF  -D1 diet  -IVF 15.  Fabry's disease hold off on Fabrazyme based on her current hemodynamic status LOS (Days) 4 A FACE TO FACE EVALUATION WAS PERFORMED  Aldred Mase T 04/11/2014, 1:08 PM

## 2014-04-12 ENCOUNTER — Inpatient Hospital Stay (HOSPITAL_COMMUNITY): Payer: Self-pay | Admitting: Physical Therapy

## 2014-04-12 ENCOUNTER — Inpatient Hospital Stay (HOSPITAL_COMMUNITY): Payer: Medicare Other | Admitting: *Deleted

## 2014-04-12 LAB — GLUCOSE, CAPILLARY
GLUCOSE-CAPILLARY: 150 mg/dL — AB (ref 70–99)
GLUCOSE-CAPILLARY: 111 mg/dL — AB (ref 70–99)
Glucose-Capillary: 104 mg/dL — ABNORMAL HIGH (ref 70–99)
Glucose-Capillary: 110 mg/dL — ABNORMAL HIGH (ref 70–99)
Glucose-Capillary: 111 mg/dL — ABNORMAL HIGH (ref 70–99)
Glucose-Capillary: 133 mg/dL — ABNORMAL HIGH (ref 70–99)
Glucose-Capillary: 139 mg/dL — ABNORMAL HIGH (ref 70–99)

## 2014-04-12 NOTE — Progress Notes (Addendum)
Physical Therapy Session Note  Patient Details  Name: Rebecca [name] MRN: [id number] Date of Birth: 08-20-1945  Today's Date: 04/12/2014 PT Individual Time: 0900-1000, 1330-1407 PT Individual Time Calculation (min): 60 min, 37 min  Short Term Goals: Week 1:  PT Short Term Goal 1 (Week 1): = LTGs due to LOS  Skilled Therapeutic Interventions/Progress Updates:    Pt continues to be limited by communication deficits in session, but at points responds to one word question with head shake and later a "yea." Pt best initiating self relevant tasks, and gait/transfer activities with gesturing and prompting. Automatic tasks such as ball toss harder to perform. Improved initiation in pm session requiring decreased cues to initiate, but increased cues for redirection. Pt would continue to benefit from skilled PT services to increase functional mobility.  Therapy Documentation Precautions:  Precautions Precautions: Fall Precaution Comments: pt is unstready on her feet. pt apparently knows how to turn off bed alarm Restrictions Weight Bearing Restrictions: No Vital Signs: Therapy Vitals Pulse Rate: 67 BP: (!) 111/91 mmHg Patient Position (if appropriate): Sitting (s/p activity), SpO2 100% RA Pain: Pain Assessment Pain Assessment: No/denies pain Mobility:  Pt is min A with transfers with cues for safety, attention, and technique Locomotion :   Pt is mod A with gait 15' with cues for sequencing, attention, and safety with Zebulon and HHA, 50'x1 in am, x1 in pm with RW. Other Treatments:  Tx 1: Pt with decreased attention and initiation managed with frequent cog rest, giving pt increased time, redirecting as necessary, and decreasing stim of environment.Communication with gesturing and one/few word commands trialed. Pt performs transfers x20 in session. Pt performs static standing 1'x4. Scooting x 10 in session. Ball toss trialed x5.  Tx 2:Pt with decreased attention managed with frequent cog  rest, giving pt increased time, redirecting as necessary, and decreasing stim of environment.  Pt performs static standing balance 1'x3. Pt performs reaching tasks to mod excursion in standing x10. Dynamic gait including visual scanning and obstacle negotiation. Pt educated on rehab plan.  See FIM for current functional status  Therapy/Group: Individual Therapy  Christia Reading 04/12/2014, 9:56 AM

## 2014-04-12 NOTE — Progress Notes (Signed)
69 y.o. 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 Independent prior to admission living with her son. 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 Echocardiogram with ejection fraction of 50% no wall motion abnormalities. EEG negative for seizure. Patient did not receive TPA. Neurology consulted placed on Eliquis alone as there was some question to her medical compliance with Xarelto prior to admission.   Subjective/Complaints: Seems to have done a little better last night. Stayed in room most of night. Review of Systems - cannot obtain secondary to aphasia  Objective: Vital Signs: Blood pressure 111/91, pulse 67, temperature 98.3 F (36.8 C), temperature source Oral, resp. rate 18, weight 62.3 kg (137 lb 5.6 oz), SpO2 100 %. No results found. Results for orders placed or performed during the hospital encounter of 04/07/14 (from the past 72 hour(s))  Glucose, capillary     Status: Abnormal   Collection Time: 04/09/14 12:11 PM  Result Value Ref Range   Glucose-Capillary 105 (H) 70 - 99 mg/dL   Comment 1 Notify RN   Glucose, capillary     Status: None   Collection Time: 04/09/14  4:35 PM  Result Value Ref Range   Glucose-Capillary 88 70 - 99 mg/dL  Glucose, capillary     Status: None   Collection Time: 04/09/14  8:30 PM  Result Value Ref Range   Glucose-Capillary 74 70 - 99 mg/dL  Glucose, capillary     Status: Abnormal   Collection Time: 04/10/14  7:10 AM  Result Value Ref Range   Glucose-Capillary 68 (L) 70 - 99 mg/dL  Glucose, capillary      Status: None   Collection Time: 04/10/14  8:40 AM  Result Value Ref Range   Glucose-Capillary 85 70 - 99 mg/dL  Glucose, capillary     Status: Abnormal   Collection Time: 04/10/14 11:33 AM  Result Value Ref Range   Glucose-Capillary 69 (L) 70 - 99 mg/dL   Comment 1 Notify RN   Glucose, capillary     Status: None   Collection Time: 04/10/14 12:29 PM  Result Value Ref Range   Glucose-Capillary 79 70 - 99 mg/dL  Glucose, capillary     Status: None   Collection Time: 04/10/14  3:59 PM  Result Value Ref Range   Glucose-Capillary 86 70 - 99 mg/dL  Glucose, capillary     Status: None   Collection Time: 04/10/14  8:15 PM  Result Value Ref Range   Glucose-Capillary 74 70 - 99 mg/dL  Glucose, capillary     Status: Abnormal   Collection Time: 04/10/14 11:59 PM  Result Value Ref Range   Glucose-Capillary 117 (H) 70 - 99 mg/dL   Comment 1 Notify RN   Glucose, capillary     Status: Abnormal   Collection Time: 04/11/14  4:07 AM  Result Value Ref Range   Glucose-Capillary 131 (H) 70 - 99 mg/dL  Glucose, capillary     Status: Abnormal   Collection Time: 04/11/14  8:06 AM  Result Value Ref Range   Glucose-Capillary 145 (H) 70 - 99 mg/dL  Glucose, capillary     Status: Abnormal   Collection Time: 04/11/14 12:02  PM  Result Value Ref Range   Glucose-Capillary 149 (H) 70 - 99 mg/dL   Comment 1 Notify RN   Glucose, capillary     Status: Abnormal   Collection Time: 04/11/14  3:45 PM  Result Value Ref Range   Glucose-Capillary 168 (H) 70 - 99 mg/dL  Glucose, capillary     Status: Abnormal   Collection Time: 04/11/14  8:39 PM  Result Value Ref Range   Glucose-Capillary 135 (H) 70 - 99 mg/dL  Glucose, capillary     Status: Abnormal   Collection Time: 04/12/14 12:39 AM  Result Value Ref Range   Glucose-Capillary 104 (H) 70 - 99 mg/dL  Glucose, capillary     Status: Abnormal   Collection Time: 04/12/14  4:21 AM  Result Value Ref Range   Glucose-Capillary 150 (H) 70 - 99 mg/dL  Glucose,  capillary     Status: Abnormal   Collection Time: 04/12/14  8:36 AM  Result Value Ref Range   Glucose-Capillary 133 (H) 70 - 99 mg/dL     HEENT: normal Cardio: RRR and no murmurs Resp: CTA B/L and unlabored GI: BS positive and nontender nondistended Extremity:  Pulses positive and No Edema Skin:   Intact Neuro: Alert/Oriented, Abnormal Sensory unable to assess secondary to aphasia and Abnormal Motor 4/5 bilateral upper and lower extremities. Does make eye-contact when cued. Musc/Skel:  Other no pain with upper or lower extremity active range of motion, no joint swelling noted Gen. no acute distress   Assessment/Plan: 1. 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 Physiatrist is providing close team supervision and 24 hour management of active medical problems listed below. Physiatrist and rehab team continue to assess barriers to discharge/monitor patient progress toward functional and medical goals. FIM: FIM - Bathing Bathing Steps Patient Completed: Right upper leg, Left upper leg, Front perineal area, Abdomen Bathing: 2: Max-Patient completes 3-4 95f 10 parts or 25-49%  FIM - Upper Body Dressing/Undressing Upper body dressing/undressing steps patient completed: Thread/unthread left sleeve of pullover shirt/dress Upper body dressing/undressing: 1: Total-Patient completed less than 25% of tasks FIM - Lower Body Dressing/Undressing Lower body dressing/undressing steps patient completed: Pull pants up/down Lower body dressing/undressing: 1: Total-Patient completed less than 25% of tasks  FIM - Toileting Toileting: 1: Two helpers  FIM - Diplomatic Services operational officer Devices: Psychiatrist Transfers: 1-Two helpers (2nd assist for IV pole with NG tube)  FIM - Banker  Devices: Arm rests Bed/Chair Transfer: 4: Chair or W/C > Bed: Min A (steadying Pt. > 75%)  FIM - Locomotion: Wheelchair Distance: 15 Locomotion: Wheelchair: 0: Activity did not occur FIM - Locomotion: Ambulation Locomotion: Ambulation Assistive Devices: Other (comment) (hand hold assist) Ambulation/Gait Assistance: 3: Mod assist (min-mod A for balance) Locomotion: Ambulation: 2: Travels 50 - 149 ft with moderate assistance (Pt: 50 - 74%)  Comprehension Comprehension Mode: Auditory Comprehension: 2-Understands basic 25 - 49% of the time/requires cueing 51 - 75% of the time  Expression Expression Mode: Nonverbal Expression: 1-Expresses basis less than 25% of the time/requires cueing greater than 75% of the time.  Social Interaction Social Interaction Mode: Asleep Social Interaction: 2-Interacts appropriately 25 - 49% of time - Needs frequent redirection.  Problem Solving Problem Solving Mode: Asleep Problem Solving: 2-Solves basic 25 - 49% of the time - needs direction more than half the time to initiate,  plan or complete simple activities  Memory Memory Mode: Not assessed Memory: 1-Recognizes or recalls less than 25% of the time/requires cueing greater than 75% of the time  Medical Problem List and Plan: 1. 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  DVT Prophylaxis/Anticoagulation: Eliquis. Monitor for any bleeding episodes 3. Pain Management: Tylenol as needed   4. Dysphagia.[name] Dysphagia 1 nectar liquids. Follow-up speech therapy 5. Neuropsych: This patient is not capable of making decisions on her own behalf.  -added low dose trazodone for sleep/restlessness 6. Skin/Wound Care: Routine skin checks 7. Fluids/Electrolytes/Nutrition: Strict I and O follow-up chemistries 8. Atrial fibrillation/hypertension. Presently on Eliquis and low-dose Lopressor. Follow-up cardiology services. Cardiac rate control.(Patient on  lisinopril 5 mg daily and hydrochlorothiazide 25 mg daily prior to admission and will resume as tolerated) 9. Mood/depression. Lexapro 5 mg daily. Provide emotional support 10. Hyperlipidemia. Lipitor 11. Diabetes mellitus with peripheral neuropathy. Hemoglobin A1c 6.0.   -sugars better with initiation of TF 12. Acute on chronic diastolic congestive heart failure. Monitor for any signs of fluid overload 13. Tobacco abuse. Counseling 14.  Poor intake cognitive based,minimal caloric and fluid intake-  -TF  -D1 diet  -IVF 15.  Fabry's disease hold off on Fabrazyme based on her current hemodynamic status LOS (Days) 5 A FACE TO FACE EVALUATION WAS PERFORMED  SWARTZ,ZACHARY T 04/12/2014, 9:32 AM

## 2014-04-12 NOTE — Plan of Care (Signed)
Problem: RH BLADDER ELIMINATION Goal: RH STG MANAGE BLADDER WITH ASSISTANCE STG Manage Bladder With Assistance. Min A  Outcome: Not Progressing Incontinent

## 2014-04-12 NOTE — Progress Notes (Signed)
Occupational Therapy Session Note  Patient Details  Name: SYANNA CONOVER MRN: [id number] Date of Birth: [birth year]  Today's Date: 04/12/2014 OT Individual Time: 1000-1100 OT Individual Time Calculation (min): 60 min 1st session        Skilled Therapeutic Interventions/Progress Updates:    Pt. Sitting at nursing station upon OT arrival.  Propelled pt in wc to room and sat at sink for bathing and dressing.  Addressed focused attention, following 1 step commands, sit to stand, standing balance.  Pt attempted one time to speak during session, but unable to complete.  Pt. Stood for 2-3 minutes during peri care with minimal assist.  Returned to nursing station with safety belt on.    Therapy Documentation Precautions:  Precautions Precautions: Fall Precaution Comments: pt is unstready on her feet. pt apparently knows how to turn off bed alarm Restrictions Weight Bearing Restrictions: No      Pain: Pain Assessment Pain Assessment: No/denies pain ADL: ADL ADL Comments: Refer to FIM    Other Treatments:     2nd session: Time: 1300-1330  (30 ) 2nd session Pain:none Individual session:  Assisted pt with feeding, standing, pressure relief and toileting.  Pt. Finishing lunch.  She was pocketing food and provided facilitation for swallowing.  Took pt to room to to oral care.  Required total assist for this.  Pt. Stood with minimal assist while OT changed wet depends.  She stood statically for 3 minutes.  Left pt with next therapy.    See FIM for current functional status  Therapy/Group: Individual Therapy  Humberto Seals 04/12/2014, 10:56 AM

## 2014-04-13 ENCOUNTER — Inpatient Hospital Stay (HOSPITAL_COMMUNITY): Payer: Medicare Other

## 2014-04-13 ENCOUNTER — Inpatient Hospital Stay (HOSPITAL_COMMUNITY): Payer: Medicare Other | Admitting: Occupational Therapy

## 2014-04-13 ENCOUNTER — Inpatient Hospital Stay (HOSPITAL_COMMUNITY): Payer: Self-pay

## 2014-04-13 DIAGNOSIS — I482 Chronic atrial fibrillation: Secondary | ICD-10-CM

## 2014-04-13 LAB — GLUCOSE, CAPILLARY
GLUCOSE-CAPILLARY: 121 mg/dL — AB (ref 70–99)
GLUCOSE-CAPILLARY: 121 mg/dL — AB (ref 70–99)
Glucose-Capillary: 103 mg/dL — ABNORMAL HIGH (ref 70–99)
Glucose-Capillary: 106 mg/dL — ABNORMAL HIGH (ref 70–99)
Glucose-Capillary: 143 mg/dL — ABNORMAL HIGH (ref 70–99)

## 2014-04-13 LAB — URINALYSIS, ROUTINE W REFLEX MICROSCOPIC
BILIRUBIN URINE: NEGATIVE
Glucose, UA: NEGATIVE mg/dL
HGB URINE DIPSTICK: NEGATIVE
Ketones, ur: NEGATIVE mg/dL
NITRITE: POSITIVE — AB
PH: 6 (ref 5.0–8.0)
PROTEIN: NEGATIVE mg/dL
SPECIFIC GRAVITY, URINE: 1.019 (ref 1.005–1.030)
UROBILINOGEN UA: 1 mg/dL (ref 0.0–1.0)

## 2014-04-13 LAB — URINE MICROSCOPIC-ADD ON

## 2014-04-13 MED ORDER — CIPROFLOXACIN HCL 250 MG PO TABS
250.0000 mg | ORAL_TABLET | Freq: Two times a day (BID) | ORAL | Status: DC
  Administered 2014-04-13 (×2): 250 mg via ORAL
  Filled 2014-04-13 (×7): qty 1

## 2014-04-13 NOTE — Progress Notes (Signed)
Speech Language Pathology Daily Session Note  Patient Details  Name: SHARMELL MEIS MRN: [id number] Date of Birth: 12-26-1945  Today's Date: 04/13/2014 SLP Individual Time: 1430-1537 SLP Individual Time Calculation (min): 67 min  Short Term Goals: Week 1: SLP Short Term Goal 1 (Week 1): Pt will initiate a timely swallow response over 50% of observable opportunities during presentations of her currently prescribed diet with max assist multimodal cuing.   SLP Short Term Goal 2 (Week 1): Pt will focus attention to verbal/auditory stimulation during structured therapeutic tasks for 15-30 seconds with max assist.   SLP Short Term Goal 3 (Week 1): Pt will follow 1 step commands for 25-50% accuracy with max assist multimodal cuing.  SLP Short Term Goal 4 (Week 1): Pt will improve functional communication via any modality (i.e. gestures, pointing, facial expressions, vocalization) during structured tasks with max assist multimodal cuing.   Skilled Therapeutic Interventions: Skilled treatment focused on automatic participation in self feeding and management of PO, initiation in functional tasks and verbal communication. Pt initially resisted in efforts to engage with therapist. With max contextual cues and encouragement from son, pt initiated self feeding of cup sips of thin diet coke with SLP providing moderate tactile cues to tilt cup for small sips. Anterior spillage and delayed swallow observed without cough (pt typically with delayed sensation of aspirate). Pt unlikely to participate in PO intake if not self feeding and self motivated. Son provided some insight into pts behavior at baseline; she has been depressed, less interactive with family members, gardens up to 5 hours a day, likes [employer], playing cards. Pt did initiate cutting deck of cards with max contextual cues but hands off assist. Pt also verbalized "yes" in response to question x1, and verbalized unintelligibly at phrase  level x1. Pt clearly trying to initiate speech at end of session with significant groping behaviors observed. Will continue efforts.    FIM:  Comprehension Comprehension Mode: Auditory Comprehension: 2-Understands basic 25 - 49% of the time/requires cueing 51 - 75% of the time Expression Expression Mode: Verbal Expression: 1-Expresses basis less than 25% of the time/requires cueing greater than 75% of the time. Social Interaction Social Interaction: 1-Interacts appropriately less than 25% of the time. May be withdrawn or combative. Problem Solving Problem Solving: 1-Solves basic less than 25% of the time - needs direction nearly all the time or does not effectively solve problems and may need a restraint for safety Memory Memory: 1-Recognizes or recalls less than 25% of the time/requires cueing greater than 75% of the time FIM - Eating Eating Activity: 4: Help with managing cup/glass  Pain Pain Assessment Faces Pain Scale: No hurt  Therapy/Group: Individual Therapy  Anona Giovannini, Riley Nearing 04/13/2014, 4:02 PM

## 2014-04-13 NOTE — Progress Notes (Signed)
Physical Therapy Session Note  Patient Details  Name: Rebecca [name] MRN: [id number] Date of Birth: 11-Apr-1945  Today's Date: 04/13/2014 PT Individual Time: 0800-0900 PT Individual Time Calculation (min): 60 min   Short Term Goals: Week 1: PT Short Term Goal 1 (Week 1): = LTGs due to LOS  Skilled Therapeutic Interventions/Progress Updates:    Today's treatment focused on trying different automaticmobility tasks to improve patient initiation and participation in therapy. Note that NG tube was pinned to L shoulder of gown during PT. Pt was received seated in w/c with RN after morning hygiene.   Therapeutic activity in standing and household ambulation with automatic tasks to encourage initiation with functional mobility.  - "tidy up room," curtains, opening/closing drawers, personal items x 10 min, with S, max VCs  - dynamic standing at sink to comb hair x 10 min, Mod A to manipulate comb and organize task  - gait x 150' with Min A for handhold stability, cues for initiation, maneuvering in busy hallway, and encouragement for attention to task.  - stairs up/down 5 stairs with 2 rails and Min A with multimodal cues for hand placement; pt uses self-selected step-to with intermittent step-through pattern with poor eccentric control of RLE.  Neuromuscular reeducation in standing with 2 rails on Biodex for visual feedback to encourage participation in postural control exercise. PT attempted weight shifting activity on Biodex x 8 minutes for improved dynamic postural control. Pt required max multimodal cues to participate, including manual facilitation of bil weight shift medial/lateral and A/P.  Pt was returned in w/c to nurses station for supervision, with quick release belt.   Pt continues to present as non-verbal. During PT, pt continues to need visual and gestural cues for initiation, attention, and completion of tasks. Marked eye closing today, unsure if due to fatigue, disinterest, or  combination. Pt's participation in therapy is limited by global aphasia and inattention.    Therapy Documentation Precautions:  Precautions Precautions: Fall Precaution Comments: pt is unstready on her feet. pt apparently knows how to turn off bed alarm Restrictions Weight Bearing Restrictions: No Vital Signs: Therapy Vitals Pulse Rate: 67 BP: 116/81 mmHg Patient Position (if appropriate): Sitting Pain: Pain Assessment Pain Assessment: No/denies pain   Locomotion : Ambulation Ambulation/Gait Assistance:  (>150') Wheelchair Mobility Distance: 15   See FIM for current functional status  Therapy/Group: Individual Therapy  Carollee Leitz 04/13/2014, 10:40 AM

## 2014-04-13 NOTE — Progress Notes (Signed)
69 y.o. 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 Independent prior to admission living with her son. 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 Echocardiogram with ejection fraction of 50% no wall motion abnormalities. EEG negative for seizure. Patient did not receive TPA. Neurology consulted placed on Eliquis alone as there was some question to her medical compliance with Xarelto prior to admission.   Subjective/Complaints: Spoke with PT , very inconsistent participation, needs hand over hand as well as visual cues to participate Review of Systems - cannot obtain secondary to aphasia  Objective: Vital Signs: Blood pressure 116/81, pulse 67, temperature 98.5 F (36.9 C), temperature source Oral, resp. rate 17, weight 63.1 kg (139 lb 1.8 oz), SpO2 97 %. No results found. Results for orders placed or performed during the hospital encounter of 04/07/14 (from the past 72 hour(s))  Glucose, capillary     Status: None   Collection Time: 04/10/14  8:40 AM  Result Value Ref Range   Glucose-Capillary 85 70 - 99 mg/dL  Glucose, capillary     Status: Abnormal   Collection Time: 04/10/14 11:33 AM  Result Value Ref Range   Glucose-Capillary 69 (L) 70 - 99 mg/dL   Comment 1 Notify RN   Glucose, capillary     Status: None   Collection Time: 04/10/14 12:29 PM  Result Value Ref Range   Glucose-Capillary 79 70 - 99 mg/dL  Glucose, capillary     Status: None   Collection Time: 04/10/14  3:59 PM  Result Value Ref Range   Glucose-Capillary 86 70 - 99 mg/dL   Glucose, capillary     Status: None   Collection Time: 04/10/14  8:15 PM  Result Value Ref Range   Glucose-Capillary 74 70 - 99 mg/dL  Glucose, capillary     Status: Abnormal   Collection Time: 04/10/14 11:59 PM  Result Value Ref Range   Glucose-Capillary 117 (H) 70 - 99 mg/dL   Comment 1 Notify RN   Glucose, capillary     Status: Abnormal   Collection Time: 04/11/14  4:07 AM  Result Value Ref Range   Glucose-Capillary 131 (H) 70 - 99 mg/dL  Glucose, capillary     Status: Abnormal   Collection Time: 04/11/14  8:06 AM  Result Value Ref Range   Glucose-Capillary 145 (H) 70 - 99 mg/dL  Glucose, capillary     Status: Abnormal   Collection Time: 04/11/14 12:02 PM  Result Value Ref Range   Glucose-Capillary 149 (H) 70 - 99 mg/dL   Comment 1 Notify RN   Glucose, capillary     Status: Abnormal   Collection Time: 04/11/14  3:45 PM  Result Value Ref Range   Glucose-Capillary 168 (H) 70 - 99 mg/dL  Glucose, capillary     Status: Abnormal   Collection Time: 04/11/14  8:39 PM  Result Value Ref Range   Glucose-Capillary 135 (H) 70 - 99 mg/dL  Glucose, capillary     Status: Abnormal   Collection Time: 04/12/14 12:39 AM  Result Value Ref Range   Glucose-Capillary 104 (H) 70 - 99 mg/dL  Glucose, capillary     Status: Abnormal  Collection Time: 04/12/14  4:21 AM  Result Value Ref Range   Glucose-Capillary 150 (H) 70 - 99 mg/dL  Glucose, capillary     Status: Abnormal   Collection Time: 04/12/14  8:36 AM  Result Value Ref Range   Glucose-Capillary 133 (H) 70 - 99 mg/dL  Glucose, capillary     Status: Abnormal   Collection Time: 04/12/14 12:27 PM  Result Value Ref Range   Glucose-Capillary 111 (H) 70 - 99 mg/dL  Glucose, capillary     Status: Abnormal   Collection Time: 04/12/14  4:32 PM  Result Value Ref Range   Glucose-Capillary 111 (H) 70 - 99 mg/dL  Glucose, capillary     Status: Abnormal   Collection Time: 04/12/14  8:09 PM  Result Value Ref Range   Glucose-Capillary 139 (H)  70 - 99 mg/dL  Glucose, capillary     Status: Abnormal   Collection Time: 04/12/14 11:56 PM  Result Value Ref Range   Glucose-Capillary 110 (H) 70 - 99 mg/dL  Glucose, capillary     Status: Abnormal   Collection Time: 04/13/14  4:26 AM  Result Value Ref Range   Glucose-Capillary 121 (H) 70 - 99 mg/dL     HEENT: normal Cardio: RRR and no murmurs Resp: CTA B/L and unlabored GI: BS positive and nontender nondistended Extremity:  Pulses positive and No Edema Skin:   Intact Neuro: Alert/Oriented, Abnormal Sensory unable to assess secondary to aphasia and Abnormal Motor 4/5 bilateral upper and lower extremities. Does make eye-contact when cued. Musc/Skel:  Other no pain with upper or lower extremity active range of motion, no joint swelling noted Gen. no acute distress   Assessment/Plan: 1. 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 Physiatrist is providing close team supervision and 24 hour management of active medical problems listed below. Physiatrist and rehab team continue to assess barriers to discharge/monitor patient progress toward functional and medical goals. 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 FIM: FIM - Bathing Bathing Steps Patient Completed: Abdomen, Chest, Right upper leg, Left upper leg Bathing: 2: Max-Patient completes 3-4 85f 10 parts or 25-49%  FIM - Upper Body Dressing/Undressing Upper body dressing/undressing steps patient completed: Thread/unthread left sleeve of pullover shirt/dress Upper body dressing/undressing: 0: Wears gown/pajamas-no public clothing FIM - Lower Body Dressing/Undressing Lower body dressing/undressing steps patient completed: Pull pants up/down Lower body dressing/undressing: 0: Wears gown/pajamas-no public clothing  FIM - Toileting Toileting: 0: Activity did not occur  FIM  - Diplomatic Services operational officer Devices: Psychiatrist Transfers: 1-Two helpers (2nd assist for IV pole with NG tube)  FIM - Banker Devices: Arm rests Bed/Chair Transfer: 4: Chair or W/C > Bed: Min A (steadying Pt. > 75%)  FIM - Locomotion: Wheelchair Distance: 15 Locomotion: Wheelchair: 0: Activity did not occur FIM - Locomotion: Ambulation Locomotion: Ambulation Assistive Devices: Other (comment) (hand hold assist) Ambulation/Gait Assistance: 3: Mod assist (min-mod A for balance) Locomotion: Ambulation: 2: Travels 50 - 149 ft with moderate assistance (Pt: 50 - 74%)  Comprehension Comprehension Mode: Auditory Comprehension: 2-Understands basic 25 - 49% of the time/requires cueing 51 - 75% of the time  Expression Expression Mode: Nonverbal Expression: 1-Expresses basis less than 25% of the time/requires cueing greater than 75% of the time.  Social Interaction Social Interaction Mode: Asleep Social Interaction: 1-Interacts appropriately less than 25% of the time.  May be withdrawn or combative.  Problem Solving Problem Solving Mode: Asleep Problem Solving: 1-Solves basic less than 25% of the time - needs direction nearly all the time or does not effectively solve problems and may need a restraint for safety  Memory Memory Mode: Not assessed Memory: 1-Recognizes or recalls less than 25% of the time/requires cueing greater than 75% of the time  Medical Problem List and Plan: 1. 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  DVT Prophylaxis/Anticoagulation: Eliquis. Monitor for any bleeding episodes 3. Pain Management: Tylenol as needed   4. Dysphagia.[name] Dysphagia 1 nectar liquids. Follow-up speech therapy 5. Neuropsych: This patient is not capable of making decisions on her own behalf.  -added low dose trazodone for sleep/restlessness 6. Skin/Wound Care:  Routine skin checks 7. Fluids/Electrolytes/Nutrition: Strict I and O follow-up chemistries 8. Atrial fibrillation/hypertension. Presently on Eliquis and low-dose Lopressor. Follow-up cardiology services. Cardiac rate control.(Patient on lisinopril 5 mg daily and hydrochlorothiazide 25 mg daily prior to admission and will resume as tolerated) 9. Mood/depression. Lexapro 5 mg daily. Provide emotional support 10. Hyperlipidemia. Lipitor 11. Diabetes mellitus with peripheral neuropathy. Hemoglobin A1c 6.0.   -sugars better with initiation of TF 12. Acute on chronic diastolic congestive heart failure. Monitor for any signs of fluid overload 13. Tobacco abuse. Counseling 14.  Poor intake cognitive based,minimal caloric and fluid intake-  -TF  -D1 diet  - may d/c IVF 15.  Fabry's disease hold off on Fabrazyme based on her current hemodynamic status LOS (Days) 6 A FACE TO FACE EVALUATION WAS PERFORMED  KIRSTEINS,ANDREW E 04/13/2014, 8:37 AM

## 2014-04-13 NOTE — Progress Notes (Signed)
Occupational Therapy Session Note  Patient Details  Name: Rebecca [name] MRN: [id number] Date of Birth: 06-11-1945  Today's Date: 04/13/2014 OT Individual Time: 1000-1100 OT Individual Time Calculation (min): 60 min    Short Term Goals: No short term goals set  Skilled Therapeutic Interventions/Progress Updates:    Pt seen for BADL training to include toilet transfers, shower transfers, bathing, dressing, and oral care. Pt requires total A with self care and min A with transfers due to severely impaired initiation, attention, motor planning, functional use of UE. Pt keeps her eyes closed constantly and will only open them when prompted with a firm voice. Numerous attempts this session to engage pt. She could initiate on a few steps, but needs hand over hand guiding. Pt did stand in shower and a sink for 10 min at a time with min A. Pt is not participating in her rehab program. Pt sat in w/c with quick release belt and taken to nursing station for supervision.  Therapy Documentation Precautions:  Precautions Precautions: Fall Precaution Comments: pt is unstready on her feet. pt apparently knows how to turn off bed alarm Restrictions Weight Bearing Restrictions: No     Pain: Pain Assessment Pain Assessment: No/denies pain ADL: ADL ADL Comments: Refer to FIM  See FIM for current functional status  Therapy/Group: Individual Therapy  SAGUIER,JULIA 04/13/2014, 11:49 AM

## 2014-04-14 ENCOUNTER — Inpatient Hospital Stay (HOSPITAL_COMMUNITY): Payer: Self-pay | Admitting: *Deleted

## 2014-04-14 ENCOUNTER — Inpatient Hospital Stay (HOSPITAL_COMMUNITY): Payer: Medicare Other | Admitting: Occupational Therapy

## 2014-04-14 ENCOUNTER — Ambulatory Visit (HOSPITAL_COMMUNITY): Payer: Self-pay | Admitting: *Deleted

## 2014-04-14 LAB — GLUCOSE, CAPILLARY
GLUCOSE-CAPILLARY: 113 mg/dL — AB (ref 70–99)
GLUCOSE-CAPILLARY: 134 mg/dL — AB (ref 70–99)
GLUCOSE-CAPILLARY: 140 mg/dL — AB (ref 70–99)
GLUCOSE-CAPILLARY: 152 mg/dL — AB (ref 70–99)
Glucose-Capillary: 131 mg/dL — ABNORMAL HIGH (ref 70–99)
Glucose-Capillary: 120 mg/dL — ABNORMAL HIGH (ref 70–99)
Glucose-Capillary: 108 mg/dL — ABNORMAL HIGH (ref 70–99)

## 2014-04-14 LAB — URINE CULTURE: Colony Count: >=100000

## 2014-04-14 MED ORDER — CEPHALEXIN 250 MG PO CAPS
250.0000 mg | ORAL_CAPSULE | Freq: Three times a day (TID) | ORAL | Status: AC
  Administered 2014-04-14 – 2014-04-19 (×17): 250 mg via ORAL
  Filled 2014-04-14 (×19): qty 1

## 2014-04-14 NOTE — Progress Notes (Signed)
Speech Language Pathology Daily Session Note  Patient Details  Name: Rebecca [name] MRN: [id number] Date of Birth: [birth year]  Today's Date: 04/14/2014 SLP Co-Treatment Time: 0800 (0800-0900 cotx with PT )-0830 SLP Co-Treatment Time Calculation (min): 30 min  Short Term Goals: Week 1: SLP Short Term Goal 1 (Week 1): Pt will initiate a timely swallow response over 50% of observable opportunities during presentations of her currently prescribed diet with max assist multimodal cuing.   SLP Short Term Goal 2 (Week 1): Pt will focus attention to verbal/auditory stimulation during structured therapeutic tasks for 15-30 seconds with max assist.   SLP Short Term Goal 3 (Week 1): Pt will follow 1 step commands for 25-50% accuracy with max assist multimodal cuing.  SLP Short Term Goal 4 (Week 1): Pt will improve functional communication via any modality (i.e. gestures, pointing, facial expressions, vocalization) during structured tasks with max assist multimodal cuing.   Skilled Therapeutic Interventions:  Pt was seen for skilled ST/PT co-tx cognitive targeting cognitive goals while engaged in functional ambulation.  See PT note for specific goals and interventions.  Upon arrival, pt was reclined in bed, awake, alert, and shrugged her shoulders when asked if she was ready for therapy.  Pt provided mod assist multimodal and environmental cues as well as increased time to facilitate initiation for getting out of bed.  Pt required hand over hand assist while standing at the sink to initiate and sequence oral care due to motor planning impairments and decreased sustained attention.  Pt demonstrated improvements in ability to complete basic self care tasks when distractions were limited (i.e. TV off, door to the room closed).  Pt with decreased motivation to participate in therapy when SLP attempted to engage pt in a basic, structured cooking task, closing her eyes and refusing to follow any structured  commands.  Pt did initiate request for seated rest break via nonverbal means (gesturing, reaching towards wheelchair) after standing and was noted to vocalize "yes" in response to basic, functional yes/no questions on 3 occassions.  Continue per current plan of care.    FIM:  Comprehension Comprehension Mode: Auditory Comprehension: 2-Understands basic 25 - 49% of the time/requires cueing 51 - 75% of the time Expression Expression Mode: Verbal Expression: 1-Expresses basis less than 25% of the time/requires cueing greater than 75% of the time. Social Interaction Social Interaction: 2-Interacts appropriately 25 - 49% of time - Needs frequent redirection. Problem Solving Problem Solving: 2-Solves basic 25 - 49% of the time - needs direction more than half the time to initiate, plan or complete simple activities Memory Memory: 1-Recognizes or recalls less than 25% of the time/requires cueing greater than 75% of the time FIM - Eating Eating Activity: 4: Help with managing cup/glass  Pain Pain Assessment Pain Assessment: No/denies pain  Therapy/Group: Other: Co-tx with PT   Rebecca [name], Melanee Spry 04/14/2014, 12:36 PM

## 2014-04-14 NOTE — Progress Notes (Signed)
69 y.o. 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 Independent prior to admission living with her son. 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 Echocardiogram with ejection fraction of 50% no wall motion abnormalities. EEG negative for seizure. Patient did not receive TPA. Neurology consulted placed on Eliquis alone as there was some question to her medical compliance with Xarelto prior to admission.   Subjective/Complaints: Pt awake, remains expressively aphasic and apraxic  Review of Systems - cannot obtain secondary to aphasia  Objective: Vital Signs: Blood pressure 109/74, pulse 60, temperature 98.3 F (36.8 C), temperature source Oral, resp. rate 18, weight 62.6 kg (138 lb 0.1 oz), SpO2 100 %. No results found. Results for orders placed or performed during the hospital encounter of 04/07/14 (from the past 72 hour(s))  Glucose, capillary     Status: Abnormal   Collection Time: 04/11/14  8:06 AM  Result Value Ref Range   Glucose-Capillary 145 (H) 70 - 99 mg/dL  Glucose, capillary     Status: Abnormal   Collection Time: 04/11/14 12:02 PM  Result Value Ref Range   Glucose-Capillary 149 (H) 70 - 99 mg/dL   Comment 1 Notify RN   Glucose, capillary     Status: Abnormal   Collection Time: 04/11/14  3:45 PM  Result Value Ref Range   Glucose-Capillary 168 (H) 70 - 99 mg/dL  Glucose, capillary     Status: Abnormal   Collection Time: 04/11/14  8:39 PM  Result Value Ref Range   Glucose-Capillary 135 (H) 70 - 99 mg/dL  Glucose, capillary      Status: Abnormal   Collection Time: 04/12/14 12:39 AM  Result Value Ref Range   Glucose-Capillary 104 (H) 70 - 99 mg/dL  Urinalysis, Routine w reflex microscopic     Status: Abnormal   Collection Time: 04/12/14  1:02 AM  Result Value Ref Range   Color, Urine YELLOW YELLOW   APPearance CLOUDY (A) CLEAR   Specific Gravity, Urine 1.019 1.005 - 1.030   pH 6.0 5.0 - 8.0   Glucose, UA NEGATIVE NEGATIVE mg/dL   Hgb urine dipstick NEGATIVE NEGATIVE   Bilirubin Urine NEGATIVE NEGATIVE   Ketones, ur NEGATIVE NEGATIVE mg/dL   Protein, ur NEGATIVE NEGATIVE mg/dL   Urobilinogen, UA 1.0 0.0 - 1.0 mg/dL   Nitrite POSITIVE (A) NEGATIVE   Leukocytes, UA SMALL (A) NEGATIVE  Urine culture     Status: None   Collection Time: 04/12/14  1:02 AM  Result Value Ref Range   Specimen Description URINE, CATHETERIZED    Special Requests NONE    Colony Count      >=100,000 COLONIES/ML Performed at [facility]    Culture      ESCHERICHIA COLI Performed at [facility]    Report Status 04/14/2014 FINAL    Organism ID, Bacteria ESCHERICHIA COLI       Susceptibility   Escherichia coli - MIC*    AMPICILLIN <=2 SENSITIVE Sensitive     CEFAZOLIN <=4 SENSITIVE Sensitive     CEFTRIAXONE <=1 SENSITIVE Sensitive     CIPROFLOXACIN <=0.25 SENSITIVE Sensitive     GENTAMICIN <=1 SENSITIVE Sensitive  LEVOFLOXACIN <=0.12 SENSITIVE Sensitive     NITROFURANTOIN 32 SENSITIVE Sensitive     TOBRAMYCIN <=1 SENSITIVE Sensitive     TRIMETH/SULFA <=20 SENSITIVE Sensitive     PIP/TAZO <=4 SENSITIVE Sensitive     * ESCHERICHIA COLI  Urine microscopic-add on     Status: Abnormal   Collection Time: 04/12/14  1:02 AM  Result Value Ref Range   Squamous Epithelial / LPF RARE RARE   WBC, UA 11-20 <3 WBC/hpf   Bacteria, UA MANY (A) RARE  Glucose, capillary     Status: Abnormal   Collection Time: 04/12/14  4:21 AM  Result Value Ref Range   Glucose-Capillary 150 (H) 70 - 99 mg/dL  Glucose, capillary      Status: Abnormal   Collection Time: 04/12/14  8:36 AM  Result Value Ref Range   Glucose-Capillary 133 (H) 70 - 99 mg/dL  Glucose, capillary     Status: Abnormal   Collection Time: 04/12/14 12:27 PM  Result Value Ref Range   Glucose-Capillary 111 (H) 70 - 99 mg/dL  Glucose, capillary     Status: Abnormal   Collection Time: 04/12/14  4:32 PM  Result Value Ref Range   Glucose-Capillary 111 (H) 70 - 99 mg/dL  Glucose, capillary     Status: Abnormal   Collection Time: 04/12/14  8:09 PM  Result Value Ref Range   Glucose-Capillary 139 (H) 70 - 99 mg/dL  Glucose, capillary     Status: Abnormal   Collection Time: 04/12/14 11:56 PM  Result Value Ref Range   Glucose-Capillary 110 (H) 70 - 99 mg/dL  Glucose, capillary     Status: Abnormal   Collection Time: 04/13/14  4:26 AM  Result Value Ref Range   Glucose-Capillary 121 (H) 70 - 99 mg/dL  Glucose, capillary     Status: Abnormal   Collection Time: 04/13/14  9:03 AM  Result Value Ref Range   Glucose-Capillary 103 (H) 70 - 99 mg/dL  Glucose, capillary     Status: Abnormal   Collection Time: 04/13/14 11:59 AM  Result Value Ref Range   Glucose-Capillary 106 (H) 70 - 99 mg/dL  Glucose, capillary     Status: Abnormal   Collection Time: 04/13/14  4:08 PM  Result Value Ref Range   Glucose-Capillary 143 (H) 70 - 99 mg/dL  Glucose, capillary     Status: Abnormal   Collection Time: 04/13/14  8:13 PM  Result Value Ref Range   Glucose-Capillary 121 (H) 70 - 99 mg/dL  Glucose, capillary     Status: Abnormal   Collection Time: 04/14/14 12:26 AM  Result Value Ref Range   Glucose-Capillary 134 (H) 70 - 99 mg/dL  Glucose, capillary     Status: Abnormal   Collection Time: 04/14/14  4:20 AM  Result Value Ref Range   Glucose-Capillary 131 (H) 70 - 99 mg/dL  Glucose, capillary     Status: Abnormal   Collection Time: 04/14/14  7:47 AM  Result Value Ref Range   Glucose-Capillary 120 (H) 70 - 99 mg/dL   Comment 1 Notify RN      HEENT:  normal Cardio: RRR and no murmurs Resp: CTA B/L and unlabored GI: BS positive and nontender nondistended Extremity:  Pulses positive and No Edema Skin:   Intact Neuro: Alert/Oriented, Abnormal Sensory unable to assess secondary to aphasia and Abnormal Motor 4/5 bilateral upper and lower extremities. Now able to do MMT with verbal commands Musc/Skel:  Other no pain with upper or lower extremity active range of  motion, no joint swelling noted Gen. no acute distress   Assessment/Plan: 1. 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 Physiatrist is providing close team supervision and 24 hour management of active medical problems listed below. Physiatrist and rehab team continue to assess barriers to discharge/monitor patient progress toward functional and medical goals. 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 but is improving May have some overall cognitive improvement with treatment of UTI FIM: FIM - Bathing Bathing Steps Patient Completed: Front perineal area Bathing: 1: Total-Patient completes 0-2 of 10 parts or less than 25%  FIM - Upper Body Dressing/Undressing Upper body dressing/undressing steps patient completed: Pull shirt over trunk Upper body dressing/undressing: 1: Total-Patient completed less than 25% of tasks FIM - Lower Body Dressing/Undressing Lower body dressing/undressing steps patient completed: Pull pants up/down Lower body dressing/undressing: 1: Total-Patient completed less than 25% of tasks  FIM - Toileting Toileting: 0: Activity did not occur  FIM - Diplomatic Services operational officer Devices: Grab bars Toilet Transfers: 4-From toilet/BSC: Min A (steadying Pt. > 75%), 4-To toilet/BSC: Min A (steadying Pt. > 75%)  FIM - Bed/Chair Transfer Bed/Chair Transfer Assistive Devices: Arm  rests Bed/Chair Transfer: 4: Chair or W/C > Bed: Min A (steadying Pt. > 75%)  FIM - Locomotion: Wheelchair Distance: 15 Locomotion: Wheelchair: 1: Total Assistance/staff pushes wheelchair (Pt<25%) FIM - Locomotion: Ambulation Locomotion: Ambulation Assistive Devices: Other (comment) (none) Ambulation/Gait Assistance:  (>150') Locomotion: Ambulation: 4: Travels 150 ft or more with minimal assistance (Pt.>75%)  Comprehension Comprehension Mode: Auditory Comprehension: 2-Understands basic 25 - 49% of the time/requires cueing 51 - 75% of the time  Expression Expression Mode: Verbal Expression: 1-Expresses basis less than 25% of the time/requires cueing greater than 75% of the time.  Social Interaction Social Interaction Mode: Asleep Social Interaction: 1-Interacts appropriately less than 25% of the time. May be withdrawn or combative.  Problem Solving Problem Solving Mode: Asleep Problem Solving: 1-Solves basic less than 25% of the time - needs direction nearly all the time or does not effectively solve problems and may need a restraint for safety  Memory Memory Mode: Not assessed Memory: 1-Recognizes or recalls less than 25% of the time/requires cueing greater than 75% of the time  Medical Problem List and Plan: 1. 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  DVT Prophylaxis/Anticoagulation: Eliquis. Monitor for any bleeding episodes 3. Pain Management: Tylenol as needed   4. Dysphagia.[name] Dysphagia 1 nectar liquids. Follow-up speech therapy 5. Neuropsych: This patient is not capable of making decisions on her own behalf.  -added low dose trazodone for sleep/restlessness 6. Skin/Wound Care: Routine skin checks 7. Fluids/Electrolytes/Nutrition: Strict I and O follow-up chemistries 8. Atrial fibrillation/hypertension. Presently on Eliquis and low-dose Lopressor. Follow-up cardiology services. Cardiac rate  control.(Patient on lisinopril 5 mg daily and hydrochlorothiazide 25 mg daily prior to admission and will resume as tolerated) 9. Mood/depression. Lexapro 5 mg daily. Provide emotional support 10. Hyperlipidemia. Lipitor 11. Diabetes mellitus with peripheral neuropathy. Hemoglobin A1c 6.0.   -sugars better with initiation of TF 12. Acute on chronic diastolic congestive heart failure. Monitor for any signs of fluid overload 13. Tobacco abuse. Counseling 14.  Poor intake cognitive based,minimal caloric and fluid intake-  -TF  -D1 diet  - may d/c IVF since on H20 flushes 15.  Fabry's disease hold off on Fabrazyme based  on her current hemodynamic status 16.  E coli pansensitive, Keflex for 7 d tx LOS (Days) 7 A FACE TO FACE EVALUATION WAS PERFORMED  Damont Balles E 04/14/2014, 8:05 AM

## 2014-04-14 NOTE — Progress Notes (Signed)
Physical Therapy Session Note  Patient Details  Name: Rebecca [name] MRN: [id number] Date of Birth: 1945-05-11  Today's Date: 04/14/2014 PT Co-Treatment Time: 0830-0900 PT Co-Treatment Time Calculation (min): 30 min  Short Term Goals: Week 1:  PT Short Term Goal 1 (Week 1): = LTGs due to LOS  Skilled Therapeutic Interventions/Progress Updates:    Today's co- treatment focused on trying different automatic functional tasks to improve patient initiation and participation in therapy. Pt presents again today with NG tube, pinned to gown. Before morning co-tx, RN informed SPT and SLP that pt refused to participate or allow RN to perform oral hygiene and that her mouth was dry. This need was addressed as a therapeutic activity by SLP.  Therapeutic activity in standing to encourage initiation with functional mobility.  - Gait x 170' x 10 minutes, with Min A for handhold stability during first trial, x60' x 5 minutes during second trial, with mod cues for initiation, maneuvering in busy hallway, and encouragement for  attention to task.   - Household ambulation in ADL apartment: Pt did not participate in kitchen reaching/sorting task, but did tolerate gait x 8 feet with Min A for handhold stability and x 10 minutes standing at kitchen  counter with BUE support.   Pt was returned in w/c to her room to sit up and wait for next therapy session, positioned within eyesight of RN, quick release belt and call bell in reach.   Pt continues to present as non-verbal, though today pt vocalized an audible "yeah" to SPT and SLP in response to yes/no question. Pt demonstrated improved ability to make her needs known through gestures, shoulder shrugging, and facial expression. Less eye closing today. Pt did not participate in kitchen reaching/sorting task, but tolerated x 10 minutes standing at kitchen counter with BUE support. Pt does lean on counter with B elbow support when fatigued. Similarly, she indicates  the need to rest during gait by reaching for hallway rail or w/c. Pt denies c/o pain.  During mobility, pt continues to need mod-max visual and gestural cues for initiation, attention, and completion of tasks.  Therapy Documentation Precautions:  Precautions Precautions: Fall Precaution Comments: pt is unstready on her feet. pt apparently knows how to turn off bed alarm Restrictions Weight Bearing Restrictions: No   Pain: Pain Assessment Pain Assessment: No/denies pain  See FIM for current functional status  Therapy/Group: Co-Treatment  Carollee Leitz 04/14/2014, 11:23 AM

## 2014-04-14 NOTE — Progress Notes (Signed)
Physical Therapy Session Note  Patient Details  Name: Rebecca [name] MRN: [id number] Date of Birth: 1945-10-08  Today's Date: 04/14/2014 PT Individual Time: 1500-1600 PT Individual Time Calculation (min): 60 min   Short Term Goals: Week 1:  PT Short Term Goal 1 (Week 1): = LTGs due to LOS  Skilled Therapeutic Interventions/Progress Updates:  Tx focused on functional mobility training, gait in varying environments, and stairs. Son, [name], present and encouraging for pt's participation. Pt performed bed mobility with S, as well as basic transfers.   Pt performed functional ambulation as well as gait training in controlled setting as well as outdoors and solarium 1x150' and 3x25'. Pt required Min HHA and safety cues for thresholds, inclines, and changes in surface type. Pt performed 5 stairs with bil rails and close S.   Pt performed static and dynamic standing balance during functional mobility tasks, including standing 3x22min with 1 UE support and no overt LOB. Pt challenged to locate items throughout solarium.   Pt left up with RN and family in [facility]      Therapy Documentation Precautions:  Precautions Precautions: Fall Precaution Comments: pt is [place] on her feet. pt apparently knows how to turn off bed alarm Restrictions Weight Bearing Restrictions: No   See FIM for current functional status  Therapy/Group: Individual Therapy  Clydene Laming, PT, DPT  04/14/2014, 4:13 PM

## 2014-04-14 NOTE — Progress Notes (Signed)
Occupational Therapy Session Note  Patient Details  Name: Rebecca [name] MRN: [id number] Date of Birth: 08/16/1945  Today's Date: 04/14/2014 OT Individual Time: 1005-1105 OT Individual Time Calculation (min): 60 min    Short Term Goals: No short term goals set  Skilled Therapeutic Interventions/Progress Updates:    Pt seen this session for BADL retraining with a focus on pt initiation. Pt has consistently been passive, not making eye contact or closing eyes this last week. She needs constant guiding or hand over hand A with all self care due to poor initiation, receptive aphasia, apraxia. Pt does understand some verbal instructions. Today, pt was asked to make a choice between a shower or bath at sink. Numerous attempts (pictures, yes/no checks, head nods,etc) were made to have pt choose. Pt would smile and look away. This was attempted again for her to choose 1 of 2 shirts. Ultimately, this clinician decided for her. She does follow verbal commands to stand up, scoot back in chair, etc. She was taken to toilet but brief was already soaked with urine. Pt was not able to go anymore. She did refuse the donning of TEDS by kicking her feet and shaking her head no.   Per report of son, pt loves to garden. Pt taken to day room to water plants. Pt would not stand up from w/c. Pt did understand this clinicians negotiation to stand for just 2 minutes and then she stood. Supervision to stand and transfer back to w/c. She did stand for 8 minutes or so, but no initiation with choosing which plants should be watered or holding the watering can. Pt taken back to room under nursing care and supervision.  Therapy Documentation Precautions:  Precautions Precautions: Fall Precaution Comments: pt is unstready on her feet. pt apparently knows how to turn off bed alarm Restrictions Weight Bearing Restrictions: No  Pain: Pain Assessment Pain Assessment: No/denies pain ADL: ADL ADL Comments: Refer to  FIM  See FIM for current functional status  Therapy/Group: Individual Therapy  Deyton Ellenbecker 04/14/2014, 11:25 AM

## 2014-04-15 ENCOUNTER — Ambulatory Visit (HOSPITAL_COMMUNITY): Payer: Self-pay | Admitting: Speech Pathology

## 2014-04-15 ENCOUNTER — Inpatient Hospital Stay (HOSPITAL_COMMUNITY): Payer: Medicare Other | Admitting: Occupational Therapy

## 2014-04-15 ENCOUNTER — Inpatient Hospital Stay (HOSPITAL_COMMUNITY): Payer: Medicare Other

## 2014-04-15 ENCOUNTER — Inpatient Hospital Stay (HOSPITAL_COMMUNITY): Payer: Self-pay | Admitting: Physical Therapy

## 2014-04-15 HISTORY — PX: OTHER SURGICAL HISTORY: SHX169

## 2014-04-15 LAB — GLUCOSE, CAPILLARY
GLUCOSE-CAPILLARY: 98 mg/dL (ref 70–99)
Glucose-Capillary: 135 mg/dL — ABNORMAL HIGH (ref 70–99)
Glucose-Capillary: 114 mg/dL — ABNORMAL HIGH (ref 70–99)
Glucose-Capillary: 98 mg/dL (ref 70–99)
Glucose-Capillary: 119 mg/dL — ABNORMAL HIGH (ref 70–99)

## 2014-04-15 NOTE — Progress Notes (Signed)
69 y.o. 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 Independent prior to admission living with her son. 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 Echocardiogram with ejection fraction of 50% no wall motion abnormalities. EEG negative for seizure. Patient did not receive TPA. Neurology consulted placed on Eliquis alone as there was some question to her medical compliance with Xarelto prior to admission.   Subjective/Complaints: Pt awake, remains expressively aphasic and apraxic Still with poor po intake Discussed with pt need to replace feeding tube if not taking po. SLP at bedside preparing to trial po fluids Son (per PA) ok with PEG if needed Review of Systems - cannot obtain secondary to aphasia  Objective: Vital Signs: Blood pressure 115/71, pulse 71, temperature 98.3 F (36.8 C), temperature source Oral, resp. rate 17, weight 62.6 kg (138 lb 0.1 oz), SpO2 98 %. No results found. Results for orders placed or performed during the hospital encounter of 04/07/14 (from the past 72 hour(s))  Glucose, capillary     Status: Abnormal   Collection Time: 04/12/14  8:36 AM  Result Value Ref Range   Glucose-Capillary 133 (H) 70 - 99 mg/dL  Glucose, capillary     Status: Abnormal   Collection Time: 04/12/14 12:27 PM  Result Value Ref Range   Glucose-Capillary 111 (H) 70 - 99 mg/dL  Glucose, capillary     Status: Abnormal   Collection Time: 04/12/14  4:32 PM  Result Value Ref Range   Glucose-Capillary 111 (H) 70 - 99 mg/dL  Glucose, capillary      Status: Abnormal   Collection Time: 04/12/14  8:09 PM  Result Value Ref Range   Glucose-Capillary 139 (H) 70 - 99 mg/dL  Glucose, capillary     Status: Abnormal   Collection Time: 04/12/14 11:56 PM  Result Value Ref Range   Glucose-Capillary 110 (H) 70 - 99 mg/dL  Glucose, capillary     Status: Abnormal   Collection Time: 04/13/14  4:26 AM  Result Value Ref Range   Glucose-Capillary 121 (H) 70 - 99 mg/dL  Glucose, capillary     Status: Abnormal   Collection Time: 04/13/14  9:03 AM  Result Value Ref Range   Glucose-Capillary 103 (H) 70 - 99 mg/dL  Glucose, capillary     Status: Abnormal   Collection Time: 04/13/14 11:59 AM  Result Value Ref Range   Glucose-Capillary 106 (H) 70 - 99 mg/dL  Glucose, capillary     Status: Abnormal   Collection Time: 04/13/14  4:08 PM  Result Value Ref Range   Glucose-Capillary 143 (H) 70 - 99 mg/dL  Glucose, capillary     Status: Abnormal   Collection Time: 04/13/14  8:13 PM  Result Value Ref Range   Glucose-Capillary 121 (H) 70 - 99 mg/dL  Glucose, capillary     Status: Abnormal   Collection Time: 04/14/14 12:26 AM  Result Value Ref Range   Glucose-Capillary 134 (H) 70 - 99 mg/dL  Glucose, capillary     Status: Abnormal   Collection Time: 04/14/14  4:20 AM  Result Value Ref Range   Glucose-Capillary 131 (H) 70 - 99 mg/dL  Glucose, capillary  Status: Abnormal   Collection Time: 04/14/14  7:47 AM  Result Value Ref Range   Glucose-Capillary 120 (H) 70 - 99 mg/dL   Comment 1 Notify RN   Glucose, capillary     Status: Abnormal   Collection Time: 04/14/14 12:23 PM  Result Value Ref Range   Glucose-Capillary 140 (H) 70 - 99 mg/dL   Comment 1 Notify RN   Glucose, capillary     Status: Abnormal   Collection Time: 04/14/14  4:57 PM  Result Value Ref Range   Glucose-Capillary 108 (H) 70 - 99 mg/dL  Glucose, capillary     Status: Abnormal   Collection Time: 04/14/14  8:09 PM  Result Value Ref Range   Glucose-Capillary 152 (H) 70 - 99 mg/dL   Glucose, capillary     Status: Abnormal   Collection Time: 04/14/14 11:45 PM  Result Value Ref Range   Glucose-Capillary 113 (H) 70 - 99 mg/dL  Glucose, capillary     Status: Abnormal   Collection Time: 04/15/14  4:02 AM  Result Value Ref Range   Glucose-Capillary 135 (H) 70 - 99 mg/dL  Glucose, capillary     Status: Abnormal   Collection Time: 04/15/14  7:04 AM  Result Value Ref Range   Glucose-Capillary 114 (H) 70 - 99 mg/dL     HEENT: normal Cardio: RRR and no murmurs Resp: CTA B/L and unlabored GI: BS positive and nontender nondistended Extremity:  Pulses positive and No Edema Skin:   Intact Neuro: Alert/Oriented, Abnormal Sensory unable to assess secondary to aphasia and Abnormal Motor 4/5 bilateral upper and lower extremities. Now able to do MMT with verbal commands Musc/Skel:  Other no pain with upper or lower extremity active range of motion, no joint swelling noted Gen. no acute distress   Assessment/Plan: 1. 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 Physiatrist is providing close team supervision and 24 hour management of active medical problems listed below. Physiatrist and rehab team continue to assess barriers to discharge/monitor patient progress toward functional and medical goals. Team conference today please see physician documentation under team conference tab, met with team face-to-face to discuss problems,progress, and goals. Formulized individual treatment plan based on medical history, underlying problem and comorbidities. FIM: FIM - Bathing Bathing Steps Patient Completed: Front perineal area, Chest Bathing: 1: Total-Patient completes 0-2 of 10 parts or less than 25%  FIM - Upper Body Dressing/Undressing Upper body dressing/undressing steps patient completed: Pull shirt over trunk Upper body  dressing/undressing: 1: Total-Patient completed less than 25% of tasks FIM - Lower Body Dressing/Undressing Lower body dressing/undressing steps patient completed: Pull pants up/down Lower body dressing/undressing: 1: Total-Patient completed less than 25% of tasks  FIM - Musician Devices: Grab bar or rail for support Toileting: 1: Total-Patient completed zero steps, helper did all 3  FIM - Radio producer Devices: Grab bars Toilet Transfers: 4-From toilet/BSC: Min A (steadying Pt. > 75%), 4-To toilet/BSC: Min A (steadying Pt. > 75%)  FIM - Bed/Chair Transfer Bed/Chair Transfer Assistive Devices: Arm rests Bed/Chair Transfer: 4: Chair or W/C > Bed: Min A (steadying Pt. > 75%)  FIM - Locomotion: Wheelchair Distance: 15 Locomotion: Wheelchair: 1: Travels less than 50 ft with supervision, cueing or coaxing FIM - Locomotion: Ambulation Locomotion: Ambulation Assistive Devices: Other (comment) (hand hold assist +1) Ambulation/Gait Assistance:  (>150') Locomotion: Ambulation: 4: Travels 150 ft  or more with minimal assistance (Pt.>75%)  Comprehension Comprehension Mode: Auditory Comprehension: 2-Understands basic 25 - 49% of the time/requires cueing 51 - 75% of the time  Expression Expression Mode: Verbal, Nonverbal Expression: 1-Expresses basis less than 25% of the time/requires cueing greater than 75% of the time.  Social Interaction Social Interaction Mode: Asleep Social Interaction: 1-Interacts appropriately less than 25% of the time. May be withdrawn or combative.  Problem Solving Problem Solving Mode: Asleep Problem Solving: 1-Solves basic less than 25% of the time - needs direction nearly all the time or does not effectively solve problems and may need a restraint for safety  Memory Memory Mode: Not assessed Memory: 1-Recognizes or recalls less than 25% of the time/requires cueing greater than 75% of the time  Medical  Problem List and Plan: 1. 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  DVT Prophylaxis/Anticoagulation: Eliquis. Monitor for any bleeding episodes 3. Pain Management: Tylenol as needed   4. Dysphagia.[name] Dysphagia 1 nectar liquids. Follow-up speech therapy 5. Neuropsych: This patient is not capable of making decisions on her own behalf.  -added low dose trazodone for sleep/restlessness 6. Skin/Wound Care: Routine skin checks 7. Fluids/Electrolytes/Nutrition: Strict I and O follow-up chemistries 8. Atrial fibrillation/hypertension. Presently on Eliquis and low-dose Lopressor. Follow-up cardiology services. Cardiac rate control.(Patient on lisinopril 5 mg daily and hydrochlorothiazide 25 mg daily prior to admission and will resume as tolerated) 9. Mood/depression. Lexapro 5 mg daily. Provide emotional support 10. Hyperlipidemia. Lipitor 11. Diabetes mellitus with peripheral neuropathy. Hemoglobin A1c 6.0.   -sugars better with initiation of TF 12. Acute on chronic diastolic congestive heart failure. Monitor for any signs of fluid overload 13. Tobacco abuse. Counseling 14.  Poor intake cognitive based,minimal caloric and fluid intake-  -TF  -D1 diet  - may d/c IVF since on H20 flushes 15.  Fabry's disease hold off on Fabrazyme based on her current hemodynamic status 16.  E coli pansensitive, Keflex for 7 d tx LOS (Days) 8 A FACE TO FACE EVALUATION WAS PERFORMED  Dalonte Hardage E 04/15/2014, 8:07 AM

## 2014-04-15 NOTE — Progress Notes (Signed)
Occupational Therapy Session Note  Patient Details  Name: Rebecca [name] MRN: [id number] Date of Birth: 1945/11/21  Today's Date: 04/15/2014 OT Individual Time: 1120-1140 OT Individual Time Calculation (min): 20 min    Short Term Goals: No short term goals set  Skilled Therapeutic Interventions/Progress Updates:    Received pt from primary OT in hallway, continued ambulating with RW back to therapy gym to retrieve w/c.  Pt required increased time (decreased selected gait speed).  Demonstration cues for proper turning technique with RW as pt attempting to push RW to side and ambulate to w/c without AD.  Attempted to engage pt in table top task with pt shaking head no or averting eyes to various suggestions, pt did smile and face brightened with discussion regarding grandsons with attempt to verbalize names and ages but no success.  Returned to room and completed stand step transfer with supervision w/c to bed.    Therapy Documentation Precautions:  Precautions Precautions: Fall Precaution Comments: pt is unstready on her feet. pt apparently knows how to turn off bed alarm Restrictions Weight Bearing Restrictions: No Vital Signs: Therapy Vitals Temp: 98.8 F (37.1 C) Temp Source: Oral Pulse Rate: 69 Resp: 18 BP: 122/78 mmHg Patient Position (if appropriate): Sitting Oxygen Therapy SpO2: 100 % O2 Device: Not Delivered Pain: Pain Assessment Pain Assessment: No/denies pain ADL: ADL ADL Comments: Refer to FIM  See FIM for current functional status  Therapy/Group: Individual Therapy  Rosalio Loud 04/15/2014, 2:56 PM

## 2014-04-15 NOTE — Progress Notes (Signed)
Pt in bed, resting quietly, responds to verbal and light tactile stimuli with eye opening, no verbal responses. NG present to T nares, Jevity 1.2 @70ml /hr infusing with no difficulty. Placement checked previously in shift, prior to medication administration per auscultation. PO ATB therapy continues, no adverse effects noted. Medicated 04/14/14 per dayshift with sorbitol, positive large results. Incontinent of B/B. Abdomen SNT, +bowel sounds. Continues to refuse PO intake. Able to perform HS mouth care with difficulty, pt attempts to clamp mouth shut, positive repeated encouragement. Poor oral hygiene noted. Frequent checks related to poor safety awareness, no impulsiveness noted. Patient appears withdrawn, slightly hostile to interactions with staff. Flat affect. No eye contact. Will continue to monitor.  Dorna Leitz, RN

## 2014-04-15 NOTE — Progress Notes (Signed)
Speech Language Pathology Weekly Progress and Session Note  Patient Details  Name: Rebecca [name] MRN: [id number] Date of Birth: 1945-07-25  Beginning of progress report period: April 08, 2014 End of progress report period: April 15, 2014  Today's Date: 04/15/2014 SLP Individual Time: 0802-0902 SLP Individual Time Calculation (min): 60 min  Short Term Goals: Week 1: SLP Short Term Goal 1 (Week 1): Pt will initiate a timely swallow response over 50% of observable opportunities during presentations of her currently prescribed diet with max assist multimodal cuing.   SLP Short Term Goal 1 - Progress (Week 1): Met SLP Short Term Goal 2 (Week 1): Pt will focus attention to verbal/auditory stimulation during structured therapeutic tasks for 15-30 seconds with max assist.   SLP Short Term Goal 2 - Progress (Week 1): Met SLP Short Term Goal 3 (Week 1): Pt will follow 1 step commands for 25-50% accuracy with max assist multimodal cuing.  SLP Short Term Goal 3 - Progress (Week 1): Met SLP Short Term Goal 4 (Week 1): Pt will improve functional communication via any modality (i.e. gestures, pointing, facial expressions, vocalization) during structured tasks with max assist multimodal cuing.  SLP Short Term Goal 4 - Progress (Week 1): Met    New Short Term Goals: Week 2: SLP Short Term Goal 1 (Week 2): Pt will initiate a timely swallow response over 75% of observable opportunities during presentations of her currently prescribed diet with mod-max assist multimodal cuing.   SLP Short Term Goal 2 (Week 2): Pt will sustain attention during basic, familiar self care tasks for 2-3 minutes with max assist.   SLP Short Term Goal 3 (Week 2): Pt will follow 1 step commands for 75% accuracy with max assist multimodal cuing.  SLP Short Term Goal 4 (Week 2): Pt will improve functional communication via any modality (i.e. gestures, pointing, facial expressions, vocalization) during structured tasks with mod  assist multimodal cuing.   Weekly Progress Updates:  Pt made small gains this reporting period and has met 4 out of 4 short term goals, largely due to improved motivation to participate in therapy.  Currently, pt requires max to hand over hand assist to complete very basic cognitive tasks due to decreased sustained attention as well as poor sequencing and initiation during functional tasks.  Pt is currently on a dys 1 diet with nectar thick liquids and requires max encouragement for PO intake.  Continue to recommend alternative means of nutrition although pt has demonstrated modest improvements for awareness of boluses and oral manipulation of materials with the implementation of cold stimulation.  Continue to recommend that pt have 24/7 supervision and follow up speech therapy at discharge.  No family has been present for family training at this time.  Pt would continue to benefit from skilled ST while inpatient in order to maximize functional independence and to reduce burden of care prior to discharge.      Intensity: Minumum of 1-2 x/day, 30 to 90 minutes Frequency: 5 out of 7 days Duration/Length of Stay: 7-10 days  Treatment/Interventions: Cognitive remediation/compensation;Cueing hierarchy;Dysphagia/aspiration precaution training;Functional tasks;Patient/family education;Oral motor exercises;Multimodal communication approach;Internal/external aids;Environmental controls;Speech/Language facilitation   Daily Session  Skilled Therapeutic Interventions: Pt was seen for skilled ST targeting dysphagia goals.  Upon arrival, pt was with RN in bed, being cleaned up from an incontinent episode.  Pt transferred to wheelchair to maximize sustained attention and alertness during structured therapeutic tasks.  Pt initially required mod-max encouragement for participation in structured tasks, which SLP was able  to fade to min with increased task familiarity and extra time.  SLP completed skilled observations  with presentations of pt's currently prescribed diet with pt exhibiting improved initiation for self feeding and no overt s/s of aspiration with pureed consistencies.  Pt continues to present with prolonged posterior transit of materials; however, no pocketing was noted during breakfast with extra time and with limited distractions.  Pt continues to present with decreased PO intake and only consumed 80% of a magic cup over a 60 minute treatment session.  Pt demonstrated sustained attention for periods of ~30 seconds before requiring redirection due to environmental distractions.  Pt also benefited from hand over hand faded to max assist verbal cues to correct anterior loss of pureed consistencies.  Pt declined any other PO including oatmeal, scrambled eggs, as well as both thickened and thin liquids.  No vocalizations appreciated on this date and pt conveyed her immediate needs/wants via yes/no responses, gestures, and facial expressions.  Goals updated on this date to reflect current progress and plan of care.       FIM:  Comprehension Comprehension Mode: Auditory Comprehension: 2-Understands basic 25 - 49% of the time/requires cueing 51 - 75% of the time Expression Expression Mode: Nonverbal Expression: 1-Expresses basis less than 25% of the time/requires cueing greater than 75% of the time. Social Interaction Social Interaction: 2-Interacts appropriately 25 - 49% of time - Needs frequent redirection. Problem Solving Problem Solving: 2-Solves basic 25 - 49% of the time - needs direction more than half the time to initiate, plan or complete simple activities Memory Memory: 1-Recognizes or recalls less than 25% of the time/requires cueing greater than 75% of the time FIM - Eating Eating Activity: 5: Needs verbal cues/supervision;4: Help with managing cup/glass  Pain Pain Assessment Pain Assessment: No/denies pain  Therapy/Group: Individual Therapy  Braden Cimo, Selinda Orion 04/15/2014, 12:27  PM

## 2014-04-15 NOTE — Progress Notes (Signed)
Patient ID: Rebecca [name], female   DOB: January 19, 1946, 69 y.o.   MRN: [id number]    Request for perc G tube placement in IR CVA x2 Dysphagia On eloquis---Held (must be off 48 hrs)  Last dose 1/26 pm Check CT abd for anatomy If approved will move forward 1/29

## 2014-04-15 NOTE — Progress Notes (Signed)
Attempted to give pt medications whole in puree as recommended by ST, with pt refusing. Notified Deatra Ina, PA.

## 2014-04-15 NOTE — Progress Notes (Signed)
Checked on pt at 0705. NG tube not in place. Removed NG tube, unable to advance it back into place. Dan Angiulli. PA notified.

## 2014-04-15 NOTE — Progress Notes (Signed)
Physical Therapy Note  Patient Details  Name: Rebecca [name] MRN: [id number] Date of Birth: 08-Dec-1945 Today's Date: 04/15/2014  Attempted to see patient for scheduled session; however, pt holding eyes tightly closed and shaking head "no" at suggestion of participation in PT session. PT and RN provided manual facilitation of movement of bilat feet toward EOB to initiate supine > sit. Once seated EOB, pt actively resisting assistance for sit>stand transfer despite max encouragement to attempt to ambulate to bathroom.   Pt missing 45 minutes of skilled physical therapy due to pt refusal. RN aware.  Calvert Cantor 04/15/2014, 2:10 PM

## 2014-04-15 NOTE — Progress Notes (Signed)
Occupational Therapy Session Note  Patient Details  Name: Rebecca [name] MRN: [id number] Date of Birth: January 26, 1946  Today's Date: 04/15/2014 OT Individual Time: 1751-0258 and 5277-8242 OT Individual Time Calculation (min): 40 min and 27 min   Short Term Goals: No short term goals set  Skilled Therapeutic Interventions/Progress Updates:    Visit 1: Pt seen for BADL retraining of toileting, grooming, bathing, dressing with a focus on initiation. Pt did demonstrate improved initiation with some tasks such as cleansing herself after toileting, putting her L leg in pants leg, and putting her dentures in. She did need a great deal of extra time to complete each task. She walked in and out of bathroom with B hand held A.  Pt adjusted in w/c with quick release belt and taken to nursing station.  Visit 2: Pt seen for functional activity of ambulation with a RW with a focus on activity tolerance and R side awareness.  Pt taken in w/c to gym and presented with a RW. Pt initially refused to stand up pushing this therapist away and shaking her head. She eventually agreed with some gentle tactile prompting. Pt walked from gym to day room and back towards gym >150 feet. Pt stopped frequently to listen to conversations.  She did not express any fatigue or desire to sit down. As pt walked towards gym door, pt's next therapist arrived.   Therapy Documentation Precautions:  Precautions Precautions: Fall Precaution Comments: pt is unstready on her feet. pt apparently knows how to turn off bed alarm Restrictions Weight Bearing Restrictions: No   Pain: Pain Assessment Pain Assessment: No/denies pain ADL: ADL ADL Comments: Refer to FIM  See FIM for current functional status  Therapy/Group: Individual Therapy  Dannelle Rhymes 04/15/2014, 11:42 AM

## 2014-04-15 NOTE — Plan of Care (Signed)
Problem: RH BOWEL ELIMINATION Goal: RH STG MANAGE BOWEL WITH ASSISTANCE STG Manage Bowel with minimal assistance.  Outcome: Not Progressing Medicated with sorbitol per dayshift, last documented BM 04/11/13. ABD, SNT, +BS, + flatus

## 2014-04-16 ENCOUNTER — Inpatient Hospital Stay (HOSPITAL_COMMUNITY): Payer: Medicare Other | Admitting: Occupational Therapy

## 2014-04-16 ENCOUNTER — Ambulatory Visit (HOSPITAL_COMMUNITY): Payer: Self-pay | Admitting: *Deleted

## 2014-04-16 ENCOUNTER — Encounter (HOSPITAL_COMMUNITY): Payer: Self-pay | Admitting: Radiology

## 2014-04-16 LAB — BASIC METABOLIC PANEL
Anion gap: 7 (ref 5–15)
BUN: 30 mg/dL — ABNORMAL HIGH (ref 6–23)
CALCIUM: 9.2 mg/dL (ref 8.4–10.5)
CHLORIDE: 106 mmol/L (ref 96–112)
CO2: 28 mmol/L (ref 19–32)
Creatinine, Ser: 1.09 mg/dL (ref 0.50–1.10)
GFR calc Af Amer: 59 mL/min — ABNORMAL LOW (ref >90)
GFR, EST NON AFRICAN AMERICAN: 51 mL/min — AB (ref >90)
Glucose, Bld: 120 mg/dL — ABNORMAL HIGH (ref 70–99)
POTASSIUM: 4.3 mmol/L (ref 3.5–5.1)
Sodium: 141 mmol/L (ref 135–145)

## 2014-04-16 LAB — CBC
HCT: 37.6 % (ref 36.0–46.0)
Hemoglobin: 12.3 g/dL (ref 12.0–15.0)
MCH: 27.5 pg (ref 26.0–34.0)
MCHC: 32.7 g/dL (ref 30.0–36.0)
MCV: 83.9 fL (ref 78.0–100.0)
Platelets: 254 10*3/uL (ref 150–400)
RBC: 4.48 MIL/uL (ref 3.87–5.11)
RDW: 15.3 % (ref 11.5–15.5)
WBC: 3.7 10*3/uL — AB (ref 4.0–10.5)

## 2014-04-16 LAB — GLUCOSE, CAPILLARY
GLUCOSE-CAPILLARY: 96 mg/dL (ref 70–99)
GLUCOSE-CAPILLARY: 115 mg/dL — AB (ref 70–99)
Glucose-Capillary: 100 mg/dL — ABNORMAL HIGH (ref 70–99)
Glucose-Capillary: 102 mg/dL — ABNORMAL HIGH (ref 70–99)
Glucose-Capillary: 93 mg/dL (ref 70–99)
Glucose-Capillary: 98 mg/dL (ref 70–99)

## 2014-04-16 LAB — PROTIME-INR
INR: 1.27 (ref 0.00–1.49)
Prothrombin Time: 16 seconds — ABNORMAL HIGH (ref 11.6–15.2)

## 2014-04-16 LAB — APTT: aPTT: 29 seconds (ref 24–37)

## 2014-04-16 MED ORDER — CEFAZOLIN SODIUM-DEXTROSE 2-3 GM-% IV SOLR
2.0000 g | INTRAVENOUS | Status: AC
  Filled 2014-04-16: qty 50

## 2014-04-16 NOTE — Progress Notes (Signed)
Patient up in wheelchair at nurse station, poor safety awareness. Awake, alert, tracks staff with eyes. Nonverbal, responds and occasionally follows simple commands. Apical heart rate irregular. Lungs CTA, diminsished. Took scheduled meds PO with repeated encouragement, nectar thick liquids. Continues B/B incontinence. Abdomen SNT, +BS. Possible PEG placement, awaiting finalization of date/orders. Need consent form signed per family/POA.  Dorna Leitz, RN

## 2014-04-16 NOTE — Plan of Care (Signed)
Problem: RH Balance Goal: LTG Patient will maintain dynamic sitting balance (PT) LTG: Patient will maintain dynamic sitting balance with assistance during mobility activities (PT)  Goal downgraded due to decreased level of participation in PT   Problem: RH Car Transfers Goal: LTG Patient will perform car transfers with assist (PT) LTG: Patient will perform car transfers with assistance (PT).  Outcome: Not Applicable Date Met:  02/54/86 Goal DC'd due to change in DC venue, likely NH  Problem: RH Floor Transfers Goal: LTG Patient will perform floor transfers w/assist (PT) LTG: Patient will perform floor transfers with assistance (PT).  Outcome: Not Applicable Date Met:  28/24/17 Goal DC'd due to change in discharge destination, likely NHP.   Problem: RH Ambulation Goal: LTG Patient will ambulate in community environment (PT) LTG: Patient will ambulate in community environment, # of feet with assistance (PT).  Outcome: Not Applicable Date Met:  53/01/04 Community ambulation goal DC'd due to change in discharge venue, likely NHP

## 2014-04-16 NOTE — Plan of Care (Signed)
Problem: RH Eating Goal: LTG Patient will perform eating w/assist, cues/equip (OT) LTG: Patient will perform eating with assist, with/without cues using equipment (OT)  Goal downgraded to min A due to decreased pt participation and initiation.  Problem: RH Grooming Goal: LTG Patient will perform grooming w/assist,cues/equip (OT) LTG: Patient will perform grooming with assist, with/without cues using equipment (OT)  Goal downgraded to mod A due to decreased pt participation and initiation.  Problem: RH Bathing Goal: LTG Patient will bathe with assist, cues/equipment (OT) LTG: Patient will bathe specified number of body parts with assist with/without cues using equipment (position) (OT)  Goal downgraded to max A due to decreased pt participation and initiation.     Problem: RH Dressing Goal: LTG Patient will perform upper body dressing (OT) LTG Patient will perform upper body dressing with assist, with/without cues (OT).  Goal downgraded to max A due to decreased pt participation and initiation.     Goal: LTG Patient will perform lower body dressing w/assist (OT) LTG: Patient will perform lower body dressing with assist, with/without cues in positioning using equipment (OT)  Goal downgraded to max A due to decreased pt participation and initiation.      Problem: RH Toileting Goal: LTG Patient will perform toileting w/assist, cues/equip (OT) LTG: Patient will perform toiletiing (clothes management/hygiene) with assist, with/without cues using equipment (OT)  Goal downgraded to max A due to decreased pt participation and initiation.      Problem: RH Functional Use of Upper Extremity Goal: LTG Patient will use RT/LT upper extremity as a (OT) LTG: Patient will use right/left upper extremity as a stabilizer/gross assist/diminished/nondominant/dominant level with assist, with/without cues during functional activity (OT)  Goal downgraded due to decreased pt participation and  initiation.

## 2014-04-16 NOTE — Progress Notes (Signed)
Speech Language Pathology Daily Session Note  Patient Details  Name: Rebecca [name] MRN: [id number] Date of Birth: 07/05/1945  Today's Date: 04/16/2014 SLP Co-Treatment Time: 0800 (co-tx with PT, 800-900 total session time )-0830 SLP Co-Treatment Time Calculation (min): 30 min  Short Term Goals: Week 2: SLP Short Term Goal 1 (Week 2): Pt will initiate a timely swallow response over 75% of observable opportunities during presentations of her currently prescribed diet with mod-max assist multimodal cuing.   SLP Short Term Goal 2 (Week 2): Pt will sustain attention during basic, familiar self care tasks for 2-3 minutes with max assist.   SLP Short Term Goal 3 (Week 2): Pt will follow 1 step commands for 75% accuracy with max assist multimodal cuing.  SLP Short Term Goal 4 (Week 2): Pt will improve functional communication via any modality (i.e. gestures, pointing, facial expressions, vocalization) during structured tasks with mod assist multimodal cuing.   Skilled Therapeutic Interventions:   Pt was seen for skilled PT/ST co-tx targeting cognitive goals while engaged in functional mobility tasks.  Upon arrival, pt was reclined in bed and refused to participate in therapy via nonverbal means (i.e. Facial expressions, hand gestures, resisting hand over hand assist).  Pt with obvious attempts at vocalizations x1 to convey immediate needs/wants; however, no phonation appreciated on this date.  Pt was eventually able to be persuaded to get out of bed with max-total assist of 2 people. Pt was transferred to recliner and then taken to the bathroom where she benefited from mod multimodal cues for initiation and sequencing of transfer from recliner to toilet, mod verbal cues to initiate hygiene. Once pt completed toileting, SLP and PT facilitated the session with basic sinkside self care tasks such as washing hands and brushing teeth.  Pt required max to hand over hand assist and extra time to initiate and  complete the abovementioned tasks in a timely manner.   Pt left in recliner with nurse tech present for full supervision during breakfast.  Continue per current plan of care.   FIM:  Comprehension Comprehension Mode: Auditory Comprehension: 2-Understands basic 25 - 49% of the time/requires cueing 51 - 75% of the time Expression Expression Mode: Nonverbal Expression: 1-Expresses basis less than 25% of the time/requires cueing greater than 75% of the time. Social Interaction Social Interaction: 2-Interacts appropriately 25 - 49% of time - Needs frequent redirection. Problem Solving Problem Solving: 2-Solves basic 25 - 49% of the time - needs direction more than half the time to initiate, plan or complete simple activities Memory Memory: 1-Recognizes or recalls less than 25% of the time/requires cueing greater than 75% of the time  Pain Pain Assessment Pain Assessment: No/denies pain  Therapy/Group: Other: co-tx  Maryjane Hurter 04/16/2014, 12:56 PM

## 2014-04-16 NOTE — Progress Notes (Signed)
Social Work Patient ID: Rebecca [name], female   DOB: 02/01/1946, 69 y.o.   MRN: [id number]   Rebecca Deck [name], LCSW Social Worker Signed  Patient Care Conference 04/16/2014 10:17 AM    Expand All Collapse All   Inpatient RehabilitationTeam Conference and Plan of Care Update Date: 04/15/2014   Time: 10:35 AM     Patient Name: Rebecca [name]       Medical Record Number: [id number]  Date of Birth: [birth year] Sex: Female         Room/Bed: 4W23C/4W23C-01 Payor Info: Payor: MEDICARE / Plan: MEDICARE PART A AND B / Product Type: *No Product type* /    Admitting Diagnosis: L FRONTAL CVA   Admit Date/Time:  04/07/2014  4:53 PM Admission Comments: No comment available   Primary Diagnosis:  Cardioembolic stroke Principal Problem: Cardioembolic stroke    Patient Active Problem List     Diagnosis  Date Noted   .  Apraxia following CVA (cerebrovascular accident)  04/09/2014   .  Cardioembolic stroke  78/29/5621   .  Protein-calorie malnutrition, severe  04/05/2014   .  Global aphasia     .  Paroxysmal atrial fibrillation     .  Hyperlipidemia     .  Acute diastolic heart failure  04/02/2014   .  Elevated troponin I level     .  Essential hypertension     .  Acute on chronic combined systolic and diastolic CHF (congestive heart failure)     .  Pulmonary hypertension     .  Chronic atrial fibrillation     .  Diabetes type 2, controlled     .  HLD (hyperlipidemia)     .  Elevated troponin  04/01/2014   .  Stroke  04/01/2014   .  Aphasia  04/01/2014   .  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  Atrial fibrillation  05/01/2013   .  Tobacco abuse  05/01/2013   .  Expressive aphasia  03/23/2013   .  Diabetes mellitus  03/23/2013   .  Hypertension  03/23/2013   .  Decreased calculated GFR  03/23/2013     Expected Discharge Date:  SNF  Team Members Present: Physician leading conference: Dr. Claudette [name] Social Worker Present: Rebecca Acosta, LCSW Nurse Present: Rebecca End,  RN PT Present: Rebecca [name], PT;Rebecca [name], PT OT Present: Rebecca [name], Rebecca [name], OT SLP Present: Rebecca [name], SLP PPS Coordinator present : Rebecca Duck, RN, CRRN        Current Status/Progress  Goal  Weekly Team Focus   Medical     severe apraxia, poor intake  adequate nutrition  PEG placement   Bowel/Bladder     incontinent of bowel and bladder. initating timed tolieting with pt refusing at times.   manage b/b min assist  timed toileting   Swallow/Nutrition/ Hydration     Dys 1, nectar thick liquids; little to no PO intake   Max assist with least restrictive diet    trials of advanced consistencies with improved sustained attention to facilitate improved PO intake    ADL's     Pt has not progressed due to severely impaired initiation, attention, apraxia and engagement in therapy. Max with bathing (mod on admission), total dressing, toileting; min A transfers  supervision standing balance and toilet transfers; min A bathing, dressing and toileting  ADL retraining, functional mobility, balance, RUE functional use, cognition - initiation, attention   Mobility     S  transfers, Min A gait with HHA x150', 5 stairs with S  supervision basic and car transfers, gait x 150' and up/down 5 steps 2 rails  Activity tolerance, balance training, communication,    Communication     nonverbal except for intermittent 1 word utterances   Max for multimodal communication   improving functional communication via any modality    Safety/Cognition/ Behavioral Observations    max-total assist for initiation, sustained attention, sequencing, basic functional problem solving   max assist   improve sustained attention.    Pain     no complaints of pain         Skin     CDI          Rehab Goals Patient on target to meet rehab goals: Yes Rehab Goals Revised: none *See Care Plan and progress notes for long and short-term goals.    Barriers to Discharge:  Refused treatments     Possible  Resolutions to Barriers:   prob SNF     Discharge Planning/Teaching Needs:   Pt will continue to need rehab after CIR.  Pt's son, [name], is agreeable to SNF transfer.  None needed due to plan for SNF at d/c.    Team Discussion:    Pt has no new medical issues, but is still having difficulty eating enough to sustain her and she will most likely need a PEG until her apraxia improves.  ST feels pt does better with less distractions and less cues.  She wonders how much of her disinterest is actually due to depression or behavioral in some way, as pt does not fully understand why she is here and who we are.  Apraxia slightly better today with magic cup.  Pt is allowing OT more to complete her oral care, but pt is not assisting.  Pt is reaching PT goals and is doing well physically.  Pt refuses to toilet with nursing, despite their attempts.  They continue to try.   Revisions to Treatment Plan:    Therapies will be qd    Continued Need for Acute Rehabilitation Level of Care: The patient requires daily medical management by a physician with specialized training in physical medicine and rehabilitation for the following conditions: Daily direction of a multidisciplinary physical rehabilitation program to ensure safe treatment while eliciting the highest outcome that is of practical value to the patient.: Yes Daily medical management of patient stability for increased activity during participation in an intensive rehabilitation regime.: Yes Daily analysis of laboratory values and/or radiology reports with any subsequent need for medication adjustment of medical intervention for : Neurological problems  Rebecca [name], Rebecca Deck 04/16/2014, 10:18 AM

## 2014-04-16 NOTE — Progress Notes (Signed)
NUTRITION FOLLOW UP  INTERVENTION: Once PEG tube is placed and ready for use: Initiate Jevity 1.2 @ 35 ml/hr via PEG and increase by 10 ml every 4 hours to goal rate of 70 ml/hr. TF should infuse at 70 ml/hr for 20 hours daily.   Tube feeding regimen provides 1680 kcal (100% of needs), 78 grams of protein, and 1130 ml of H2O.   And Provide 100 ml free water flushes via PEG 5 times daily.   Will continue to monitor.  NUTRITION DIAGNOSIS: Inadequate oral intake related to dysphagia and poor appetite as evidenced by <25% meal completion.; ongoing  Goal: Pt to meet >/= 90% of their estimated nutrition needs; not met  Monitor:  TF initiation/tolerance, weight trend, labs  69 y.o. female  Admitting Dx: Cardioembolic stroke  ASSESSMENT: 69 y.o. right handed female with history of Fabrys disease, diastolic congestive heart failure, hypertension, atrial fibrillation maintained on Xarelto and low-dose aspirin with question of medical compliance, tobacco abuse, diabetes mellitus and peripheral neuropathy past CVA January 2015 and residual expressive aphasia. Presented 04/01/2014 with worsening aphasia and right-sided weakness as well as respiratory compromise.  Per RN, pt pulled out her NGT yesterday. Current plans for PEG tube scheduled for placement tomorrow. Pt is currently on a dysphagia 1 diet with nectar thick liquids. Meal completion has ranged from zero to a couple of bites of food only. Per RN pt has only been eating when she feels like it. Recommend TF to provide 100% of nutrition as po intake is poor.   Will continue to monitor.  Labs: Low GFR. High BUN  Height: Ht Readings from Last 1 Encounters:  04/02/14 _0  (1.626 m)    Weight: Wt Readings from Last 1 Encounters:  04/16/14 138 lb 7.2 oz (62.8 kg)    Body mass index is 23.75 kg/(m^2).  Re-Estimated Nutritional Needs: Kcal: 1650-1850 Protein: 75-85 grams Fluid: 1.6-1.8.5 L/day  Skin: +1 RLE and LLE  edema  Diet Order: DIET - DYS 1 Diet NPO time specified Except for: Sips with Meds, nectar-thick   Intake/Output Summary (Last 24 hours) at 04/16/14 1423 Last data filed at 04/16/14 1000  Gross per 24 hour  Intake     60 ml  Output      0 ml  Net     60 ml    Last BM: 1/27  Labs:   Recent Labs Lab 04/16/14 1255  NA 141  K 4.3  CL 106  CO2 28  BUN 30*  CREATININE 1.09  CALCIUM 9.2  GLUCOSE 120*    CBG (last 3)   Recent Labs  04/16/14 0402 04/16/14 0857 04/16/14 1158  GLUCAP 96 102* 115*    Scheduled Meds: . antiseptic oral rinse  7 mL Mouth Rinse q12n4p  . atorvastatin  40 mg Oral q1800  . [START ON 04/17/2014]  ceFAZolin (ANCEF) IV  2 g Intravenous On Call  . cephALEXin  250 mg Oral 3 times per day  . chlorhexidine  15 mL Mouth Rinse BID  . cloNIDine  0.1 mg Transdermal Weekly  . escitalopram  5 mg Oral Daily  . insulin aspart  0-9 Units Subcutaneous 6 times per day  . loratadine  10 mg Oral Daily  . megestrol  400 mg Oral BID  . metoprolol tartrate  12.5 mg Per Tube BID  . multivitamin  5 mL Per Tube Daily    Continuous Infusions:    Past Medical History  Diagnosis Date  . Hypertension   .  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 Expressive aphasia 03/23/2013  . Atrial fibrillation 05/01/2013  . Tobacco abuse 05/01/2013  . Diabetes mellitus without complication   . Depression   . Stroke     2015    Past Surgical History  Procedure Laterality Date  . Tee without cardioversion N/A 03/26/2013    Procedure: TRANSESOPHAGEAL ECHOCARDIOGRAM (TEE);  Surgeon: Sanda Klein, MD;  Location: [facility] ENDOSCOPY;  Service: Cardiovascular;  Laterality: N/A;  . Cesarean section      x3  . Hip replacement Right 1990's  . Hip replacemet Left 1990's   Kallie Locks, MS, RD, LDN Pager # [phone] After hours/ weekend pager # [phone]

## 2014-04-16 NOTE — Progress Notes (Signed)
Physical Therapy Weekly Progress Note  Patient Details  Name: Rebecca [name] MRN: [id number] Date of Birth: Jun 27, 1945  Beginning of progress report period: April 08, 2013 End of progress report period: April 16, 2013  Today's Date: 04/16/2014 PT Co-treat Time: 0830-0900 PT Co-treatTime Calculation (min): 30 min (Co-treat total time 8:00-9:00)   Patient has met 0 of 9 long term goals.  Short term goals not set due to estimated length of stay. Pt has demonstrated decreased level of participation in the rehab setting as her stay progressed. She is needing Max/total encouragement to participate at any functional level, typically needing Min A for initiating movements and continuing gait. Pt is likely able to perform mobility at S level when she chooses to do so.    Patient continues to demonstrate the following deficits: decreased cardiorespiratory endurance, apraxia, and decreased initiation of mobility and therefore will continue to benefit from skilled PT intervention to enhance overall performance with activity tolerance, balance, attention, awareness and coordination.  See Patient's Care Plan for progression toward long term goals.  Patient not progressing toward long term goals.  See goal revision..  Plan of care revisions: some LTG's have been downgraded to reflect pt's level of participation as well as likely DC to NH rather than home setting. .  Skilled Therapeutic Interventions/Progress Updates:  Skilled Co-treat session with SLP for increased functional activity, functional ambulation in room, and standing balance.  Pt required Total A to transition from supine>sitting EOB for therapy with max emotional encouragement. Pt engaged in functional mobility at S/Min A level during sit<>stands from bed, toilet, and recliner. Pt performed functional ambulation in room with with min-A for hand-hold and guiding encouragement. Pt demonstrated static and dynamic sitting and standing balance  during functional tasks, and was able to stand at sink with 1/2 UEs supported x27mn with manual facilitation for posture. Pt continues to demonstrate apraxia with functional tasks including toileting and grooming.  Pt left up in recliner with NT for breakfast.      Therapy Documentation Precautions:  Precautions Precautions: Fall Precaution Comments: pt is unstready on her feet. pt apparently knows how to turn off bed alarm Restrictions Weight Bearing Restrictions: No General:   Vital Signs:   Pain: Pain Assessment Pain Assessment: No/denies pain Mobility:   Locomotion : Ambulation Ambulation/Gait Assistance: 5: Supervision   See FIM for current functional status  Therapy/Group: Co-Treatment  CKennieth Rad PT, DPT   04/16/2014, 12:25 PM

## 2014-04-16 NOTE — Progress Notes (Signed)
Social Work Patient ID: Rebecca [name], female   DOB: [birth year], 69 y.o.   MRN: [id number]   CSW met briefly with pt who kept closing her eyes or turned away from [place].  CSW told her that CSW would call her son, Rebecca [name], to give him the conference update and to discuss her d/c plan.  Pt nodded.  CSW spoke with Rebecca [name] via telephone 04-16-14 and updated him on team conference discussion and team's recommendation for SNF after CIR.  Rebecca [name] agrees with this recommendation with the hope that he could bring her home after SNF with improvement.  CSW assured Rebecca [name] that CSW would work with him through this process and assist with tx to SNF.  CSW answered questions.  Per MD, pt will most likely get a PEG soon and once it is working properly, pt can tx.  Son expressed understanding.  He wants to tour facilities available to pt.  CSW will inform him of those and then await his decision.

## 2014-04-16 NOTE — Progress Notes (Signed)
Subjective/Complaints: Son at bedside discussed PEG tube as well as Fabrazyme infusion Review of Systems - cannot obtain secondary to aphasia  Objective: Vital Signs: Blood pressure 116/68, pulse 67, temperature 98 F (36.7 C), temperature source Oral, resp. rate 18, weight 62.8 kg (138 lb 7.2 oz), SpO2 100 %. Ct Abdomen Wo Contrast  04/15/2014   CLINICAL DATA:  Dysphagia, evaluate for gastric tube placement  EXAM: CT ABDOMEN WITHOUT CONTRAST  TECHNIQUE: Multidetector CT imaging of the abdomen was performed following the standard protocol without IV contrast.  COMPARISON:  None.  FINDINGS: Lower chest:  Mild linear atelectasis in the medial left lower lobe.  Cardiomegaly.  Hepatobiliary: Liver is unremarkable.  Layering sludge in the gallbladder. No intrahepatic or extrahepatic ductal dilatation.  Pancreas: Pancreatic atrophy.  Spleen: Within normal limits.  Adrenals/Urinary Tract: Adrenal glands are unremarkable.  6 mm nonobstructing left renal calculus (coronal image 66). Right kidney is unremarkable. No hydronephrosis.  Stomach/Bowel: Stomach is unremarkable.  Portions of the transverse colon traverses anterior to the gastric body.  Visualized bowel is otherwise grossly unremarkable.  Vascular/Lymphatic: Atherosclerotic calcifications of the abdominal aorta.  No suspicious abdominal lymphadenopathy.  Other:  No abdominal ascites.  Musculoskeletal: Degenerative changes of the visualized thoracolumbar spine with dextroscoliosis.  IMPRESSION: 6 mm nonobstructing left renal calculus.  No hydronephrosis.  Portions of the transverse colon traverses anterior to the gastric body.  Otherwise unremarkable CT abdomen.   Electronically Signed   By: Charline Bills M.D.   On: 04/15/2014 17:26   Results for orders placed or performed during the hospital encounter of 04/07/14 (from the past 72 hour(s))  Glucose, capillary     Status: Abnormal   Collection Time: 04/13/14  9:03 AM  Result Value Ref Range    Glucose-Capillary 103 (H) 70 - 99 mg/dL  Glucose, capillary     Status: Abnormal   Collection Time: 04/13/14 11:59 AM  Result Value Ref Range   Glucose-Capillary 106 (H) 70 - 99 mg/dL  Glucose, capillary     Status: Abnormal   Collection Time: 04/13/14  4:08 PM  Result Value Ref Range   Glucose-Capillary 143 (H) 70 - 99 mg/dL  Glucose, capillary     Status: Abnormal   Collection Time: 04/13/14  8:13 PM  Result Value Ref Range   Glucose-Capillary 121 (H) 70 - 99 mg/dL  Glucose, capillary     Status: Abnormal   Collection Time: 04/14/14 12:26 AM  Result Value Ref Range   Glucose-Capillary 134 (H) 70 - 99 mg/dL  Glucose, capillary     Status: Abnormal   Collection Time: 04/14/14  4:20 AM  Result Value Ref Range   Glucose-Capillary 131 (H) 70 - 99 mg/dL  Glucose, capillary     Status: Abnormal   Collection Time: 04/14/14  7:47 AM  Result Value Ref Range   Glucose-Capillary 120 (H) 70 - 99 mg/dL   Comment 1 Notify RN   Glucose, capillary     Status: Abnormal   Collection Time: 04/14/14 12:23 PM  Result Value Ref Range   Glucose-Capillary 140 (H) 70 - 99 mg/dL   Comment 1 Notify RN   Glucose, capillary     Status: Abnormal   Collection Time: 04/14/14  4:57 PM  Result Value Ref Range   Glucose-Capillary 108 (H) 70 - 99 mg/dL  Glucose, capillary     Status: Abnormal   Collection Time: 04/14/14  8:09 PM  Result Value Ref Range   Glucose-Capillary 152 (H) 70 - 99 mg/dL  Glucose, capillary     Status: Abnormal   Collection Time: 04/14/14 11:45 PM  Result Value Ref Range   Glucose-Capillary 113 (H) 70 - 99 mg/dL  Glucose, capillary     Status: Abnormal   Collection Time: 04/15/14  4:02 AM  Result Value Ref Range   Glucose-Capillary 135 (H) 70 - 99 mg/dL  Glucose, capillary     Status: Abnormal   Collection Time: 04/15/14  7:04 AM  Result Value Ref Range   Glucose-Capillary 114 (H) 70 - 99 mg/dL  Glucose, capillary     Status: None   Collection Time: 04/15/14 11:56 AM  Result  Value Ref Range   Glucose-Capillary 98 70 - 99 mg/dL  Glucose, capillary     Status: None   Collection Time: 04/15/14  4:31 PM  Result Value Ref Range   Glucose-Capillary 98 70 - 99 mg/dL  Glucose, capillary     Status: Abnormal   Collection Time: 04/15/14  8:28 PM  Result Value Ref Range   Glucose-Capillary 119 (H) 70 - 99 mg/dL   Comment 1 Notify RN   Glucose, capillary     Status: None   Collection Time: 04/16/14 12:03 AM  Result Value Ref Range   Glucose-Capillary 93 70 - 99 mg/dL   Comment 1 Notify RN   Glucose, capillary     Status: None   Collection Time: 04/16/14  4:02 AM  Result Value Ref Range   Glucose-Capillary 96 70 - 99 mg/dL   Comment 1 Notify RN      HEENT: normal Cardio: RRR and no murmurs Resp: CTA B/L and unlabored GI: BS positive and nontender nondistended Extremity:  Pulses positive and No Edema Skin:   Intact Neuro: Alert/Oriented, Abnormal Sensory unable to assess secondary to aphasia and Abnormal Motor 4/5 bilateral upper and lower extremities. Now able to do MMT with verbal commands Musc/Skel:  Other no pain with upper or lower extremity active range of motion, no joint swelling noted Gen. no acute distress   Assessment/Plan: 1. 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 Physiatrist is providing close team supervision and 24 hour management of active medical problems listed below. Physiatrist and rehab team continue to assess barriers to discharge/monitor patient progress toward functional and medical goals. Appreciate IR note. Plan for PEG in the morning. Hold Eliquis until  after procedure and oked by IR to resume FIM: FIM - Bathing Bathing Steps Patient Completed: Front perineal area, Buttocks Bathing: 1: Total-Patient completes 0-2 of 10 parts or less than 25%  FIM - Upper Body  Dressing/Undressing Upper body dressing/undressing steps patient completed: Pull shirt over trunk Upper body dressing/undressing: 1: Total-Patient completed less than 25% of tasks FIM - Lower Body Dressing/Undressing Lower body dressing/undressing steps patient completed: Pull pants up/down, Thread/unthread left pants leg Lower body dressing/undressing: 2: Max-Patient completed 25-49% of tasks  FIM - Toileting Toileting steps completed by patient: Performs perineal hygiene Toileting Assistive Devices: Grab bar or rail for support Toileting: 2: Max-Patient completed 1 of 3 steps  FIM - Diplomatic Services operational officer Devices: Grab bars Toilet Transfers: 4-From toilet/BSC: Min A (steadying Pt. > 75%), 4-To toilet/BSC: Min A (steadying Pt. > 75%)  FIM - Bed/Chair Transfer Bed/Chair Transfer Assistive Devices: Arm rests Bed/Chair Transfer: 4: Chair or W/C > Bed: Min A (steadying Pt. > 75%)  FIM - Locomotion: Wheelchair Distance: 15 Locomotion:  Wheelchair: 1: Travels less than 50 ft with supervision, cueing or coaxing FIM - Locomotion: Ambulation Locomotion: Ambulation Assistive Devices: Other (comment) (hand hold assist +1) Ambulation/Gait Assistance:  (>150') Locomotion: Ambulation: 4: Travels 150 ft or more with minimal assistance (Pt.>75%)  Comprehension Comprehension Mode: Auditory Comprehension: 2-Understands basic 25 - 49% of the time/requires cueing 51 - 75% of the time  Expression Expression Mode: Verbal, Nonverbal Expression: 1-Expresses basis less than 25% of the time/requires cueing greater than 75% of the time.  Social Interaction Social Interaction Mode: Asleep Social Interaction: 1-Interacts appropriately less than 25% of the time. May be withdrawn or combative.  Problem Solving Problem Solving Mode: Asleep Problem Solving: 1-Solves basic less than 25% of the time - needs direction nearly all the time or does not effectively solve problems and may need  a restraint for safety  Memory Memory Mode: Not assessed Memory: 1-Recognizes or recalls less than 25% of the time/requires cueing greater than 75% of the time  Medical Problem List and Plan: 1. 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  DVT Prophylaxis/Anticoagulation: Eliquis. Monitor for any bleeding episodes 3. Pain Management: Tylenol as needed   4. Dysphagia.[name] Dysphagia 1 nectar liquids. Follow-up speech therapy 5. Neuropsych: This patient is not capable of making decisions on her own behalf.  -added low dose trazodone for sleep/restlessness 6. Skin/Wound Care: Routine skin checks 7. Fluids/Electrolytes/Nutrition: Strict I and O follow-up chemistries 8. Atrial fibrillation/hypertension. Presently on Eliquis and low-dose Lopressor. Follow-up cardiology services. Cardiac rate control.(Patient on lisinopril 5 mg daily and hydrochlorothiazide 25 mg daily prior to admission and will resume as tolerated) 9. Mood/depression. Lexapro 5 mg daily. Provide emotional support 10. Hyperlipidemia. Lipitor 11. Diabetes mellitus with peripheral neuropathy. Hemoglobin A1c 6.0.   -sugars better with initiation of TF 12. Acute on chronic diastolic congestive heart failure. Monitor for any signs of fluid overload 13. Tobacco abuse. Counseling 14.  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 Will have PEG tomorrow and start of 2 feeds over the weekend. Spoke with nephrology and favor as I'm infusion will be scheduled at [facility] next week 16.  E coli pansensitive, Keflex for 7 d tx LOS (Days) 9 A FACE TO FACE EVALUATION WAS PERFORMED  KIRSTEINS,ANDREW E 04/16/2014, 8:28 AM

## 2014-04-16 NOTE — Consult Note (Signed)
Chief Complaint: Dysphagia Global aphasia Failure to thrive  Referring Physician(s): Dr Wynn Banker  History of Present Illness: Rebecca [name] is a 69 y.o. female  Pt with Hx Fabrys disease; smoker; afib Hx CVA 03/2013; 03/2014 Global aphasia Dysphagia Malnutrition Need for long term care Request made for percutaneous gastric tube placement in IR Dr Deanne Coffer has reviewed imaging and approves procedure I have seen and examined pt Last Dose Eloquis 1/26 pm Now scheduled for G tube 1/29  Past Medical History  Diagnosis Date  . Hypertension   . 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 Expressive aphasia 03/23/2013  . Atrial fibrillation 05/01/2013  . Tobacco abuse 05/01/2013  . Diabetes mellitus without complication   . Depression   . Stroke     2015    Past Surgical History  Procedure Laterality Date  . Tee without cardioversion N/A 03/26/2013    Procedure: TRANSESOPHAGEAL ECHOCARDIOGRAM (TEE);  Surgeon: Thurmon Fair, MD;  Location: [facility] ENDOSCOPY;  Service: Cardiovascular;  Laterality: N/A;  . Cesarean section      x3  . Hip replacement Right 1990's  . Hip replacemet Left 1990's    Allergies: Review of patient's allergies indicates no known allergies.  Medications: Prior to Admission medications   Medication Sig Start Date End Date Taking? Authorizing Provider  acetaminophen (TYLENOL) 500 MG tablet Take 500 mg by mouth every 6 (six) hours as needed for moderate pain.   Yes Historical Provider, MD  agalsidase beta 1 mg/kg in sodium chloride 0.9 % Inject 1 mg/kg into the vein every 14 (fourteen) days.   Yes Historical Provider, MD  apixaban (ELIQUIS) 5 MG TABS tablet Take 1 tablet (5 mg total) by mouth 2 (two) times daily. 04/07/14  Yes Drema Dallas, MD  aspirin EC 81 MG tablet Take 81 mg by mouth daily.   Yes Historical Provider, MD  atorvastatin (LIPITOR) 40 MG tablet Take 1 tablet (40 mg total) by mouth daily at 6 PM. 03/27/13  Yes Myra Rude, MD  brimonidine (ALPHAGAN  P) 0.1 % SOLN Place 1 drop into both eyes 2 (two) times daily.   Yes Historical Provider, MD  carboxymethylcellulose 1 % ophthalmic solution Apply 1 drop to eye as needed (for dry eyes).   Yes Historical Provider, MD  escitalopram (LEXAPRO) 5 MG tablet Take 1 tablet (5 mg total) by mouth daily. 02/19/14  Yes Thresa Ross, MD  fexofenadine (ALLEGRA) 180 MG tablet Take 180 mg by mouth daily.   Yes Historical Provider, MD  Ibuprofen-Diphenhydramine Cit 200-38 MG TABS Take 1 tablet by mouth at bedtime as needed (for sleep).   Yes Historical Provider, MD  lisinopril (PRINIVIL,ZESTRIL) 5 MG tablet Take 5 mg by mouth daily.   Yes Historical Provider, MD  Maltodextrin-Xanthan Gum (RESOURCE THICKENUP CLEAR) POWD Take 15,000 g by mouth as needed. 04/07/14  Yes Drema Dallas, MD  metoprolol succinate (TOPROL-XL) 25 MG 24 hr tablet Take 25 mg by mouth every Monday, Wednesday, and Friday.   Yes Historical Provider, MD  metoprolol tartrate (LOPRESSOR) 25 MG tablet Take 0.5 tablets (12.5 mg total) by mouth 2 (two) times daily. 04/07/14  Yes Drema Dallas, MD  Multiple Vitamin (MULTIVITAMIN WITH MINERALS) TABS tablet Take 1 tablet by mouth daily.   Yes Historical Provider, MD  Nutritional Supplements (FEEDING SUPPLEMENT, JEVITY 1.2 CAL,) LIQD Place 1,000 mLs into feeding tube continuous. 04/07/14  Yes Drema Dallas, MD  senna-docusate (SENOKOT-S) 8.6-50 MG per tablet Take 1 tablet by mouth at bedtime as needed  for mild constipation. 04/07/14  Yes Drema Dallas, MD    Family History  Problem Relation Age of Onset  . Alcohol abuse Maternal Aunt     History   Social History  . Marital Status: Single    Spouse Name: N/A    Number of Children: N/A  . Years of Education: N/A   Social History Main Topics  . Smoking status: Never Smoker   . Smokeless tobacco: None  . Alcohol Use: No  . Drug Use: No  . Sexual Activity: Not Currently   Other Topics Concern  . None   Social History Narrative     Review  of Systems: A 12 point ROS discussed and pertinent positives are indicated in the HPI above.  All other systems are negative.  Review of Systems  Constitutional: Positive for activity change, appetite change and fatigue. Negative for fever.  Respiratory: Negative for cough and shortness of breath.   Gastrointestinal: Negative for abdominal pain.  Neurological: Positive for weakness.    Vital Signs: BP 116/68 mmHg  Pulse 67  Temp(Src) 98 F (36.7 C) (Oral)  Resp 18  Wt 62.8 kg (138 lb 7.2 oz)  SpO2 100%  Physical Exam  Cardiovascular: Normal rate and regular rhythm.   No murmur heard. Pulmonary/Chest: Effort normal and breath sounds normal. She has no wheezes.  Abdominal: Soft. Bowel sounds are normal.  Musculoskeletal: Normal range of motion.  Does move all 4s to command  Neurological:  Pt alert and in chair Does not communicate well; but does follow simple commands  Skin: Skin is warm and dry.  Psychiatric:  Consented with son via phone  Nursing note and vitals reviewed.   Imaging: Ct Abdomen Wo Contrast  04/15/2014   CLINICAL DATA:  Dysphagia, evaluate for gastric tube placement  EXAM: CT ABDOMEN WITHOUT CONTRAST  TECHNIQUE: Multidetector CT imaging of the abdomen was performed following the standard protocol without IV contrast.  COMPARISON:  None.  FINDINGS: Lower chest:  Mild linear atelectasis in the medial left lower lobe.  Cardiomegaly.  Hepatobiliary: Liver is unremarkable.  Layering sludge in the gallbladder. No intrahepatic or extrahepatic ductal dilatation.  Pancreas: Pancreatic atrophy.  Spleen: Within normal limits.  Adrenals/Urinary Tract: Adrenal glands are unremarkable.  6 mm nonobstructing left renal calculus (coronal image 66). Right kidney is unremarkable. No hydronephrosis.  Stomach/Bowel: Stomach is unremarkable.  Portions of the transverse colon traverses anterior to the gastric body.  Visualized bowel is otherwise grossly unremarkable.  Vascular/Lymphatic:  Atherosclerotic calcifications of the abdominal aorta.  No suspicious abdominal lymphadenopathy.  Other:  No abdominal ascites.  Musculoskeletal: Degenerative changes of the visualized thoracolumbar spine with dextroscoliosis.  IMPRESSION: 6 mm nonobstructing left renal calculus.  No hydronephrosis.  Portions of the transverse colon traverses anterior to the gastric body.  Otherwise unremarkable CT abdomen.   Electronically Signed   By: Charline Bills M.D.   On: 04/15/2014 17:26   Dg Chest 2 View  04/02/2014   CLINICAL DATA:  Shortness of breath, and aphasia  EXAM: CHEST  2 VIEW  COMPARISON:  Portable chest x-ray of April 01, 2014  FINDINGS: The lungs are well-expanded and clear. The cardiopericardial silhouette is enlarged. The pulmonary vascularity is mildly prominent centrally without cephalization. There is no pulmonary edema. There is tortuosity of the descending thoracic aorta. The bony thorax exhibits no acute abnormalities.  IMPRESSION: Cardiomegaly without pulmonary edema. There is no pneumonia. Stable appearance of the chest since yesterday's study.   Electronically Signed  By: David  [place]   On: 04/02/2014 07:38   Ct Head Wo Contrast  04/01/2014   CLINICAL DATA:  Unresponsive tonight.  Initial encounter.  EXAM: CT HEAD WITHOUT CONTRAST  TECHNIQUE: Contiguous axial images were obtained from the base of the skull through the vertex without intravenous contrast.  COMPARISON:  Head CT 12/25/2003.  MRI brain 03/24/2013.  FINDINGS: There is no evidence of acute intracranial hemorrhage, mass lesion, brain edema or extra-axial fluid collection. The ventricles and subarachnoid spaces are mildly prominent but stable. Extensive chronic small vessel ischemic changes and old cortical infarcts are again noted bilaterally. There is increased low-density and gyral swelling in the left frontotemporal region (images 13-16) which could reflect a subacute infarct. Intracranial vascular calcifications again  noted.  The visualized paranasal sinuses, mastoid air cells and middle ears are clear. The calvarium is intact.  IMPRESSION: Extensive chronic ischemic changes bilaterally with possible subacute extension in the left frontotemporal region. No evidence of acute intracranial hemorrhage.   Electronically Signed   By: Roxy Horseman M.D.   On: 04/01/2014 20:55   Mr Brain Wo Contrast  04/02/2014   CLINICAL DATA:  Recent hospitalization for stroke, found noncommunicating and shivering today.  EXAM: MRI HEAD WITHOUT CONTRAST  MRA HEAD WITHOUT CONTRAST  TECHNIQUE: Multiplanar, multiecho pulse sequences of the brain and surrounding structures were obtained without intravenous contrast. Angiographic images of the head were obtained using MRA technique without contrast.  COMPARISON:  CT of the head March 24, 2013 and CT of the head April 01, 2014  FINDINGS: MRI HEAD FINDINGS  Moderately motion degraded examination. Reduced diffusion in LEFT frontal lobe, spanning greater territory than on prior MRI. Corresponding low ADC values. Multiple subcentimeter central with a lesser extent peripheral foci of susceptibility artifact, increased from prior examination though, in a similar distribution.  Moderate ventriculomegaly, likely on the basis of global parenchymal brain volume loss as there is overall commensurate enlargement of cerebral sulci and cerebellar folia. Bilateral small cerebellar infarcts, relatively unchanged. Bilateral occipital lobe encephalomalacia, advanced on the RIGHT, new on the LEFT. Severe confluent supratentorial white matter FLAIR T2 hyperintensities without mass effect. Cystic encephalomalacia of the LEFT frontal lobe underlying the area of T2 bright cytotoxic edema. Bilateral basal ganglia and thalamus lacunar infarcts.  No abnormal extra-axial fluid collections. Status post bilateral ocular lens implants. RIGHT maxillary mucosal retention cyst without paranasal sinus air-fluid levels. The mastoid air  cells are well aerated. Mild sellar expansion, can be seen with arachnoid cyst or less likely empty sella considering a thin rind of apparent pituitary tissue along the floor. No cerebellar tonsillar ectopia. No suspicious calvarial bone marrow signal.  MRA HEAD FINDINGS  Moderately motion degraded examination.  Anterior circulation: Flow related enhancement observed within the cervical, petrous, cavernous and supra clinoid internal carotid arteries. Due to motion, there is a duplicated appearance of the carotid siphons. Flow related enhancement within the anterior cerebral arteries, robust flow related enhancement of the RIGHT middle cerebral artery, somewhat less robust flow related enhancement of LEFT middle cerebral artery without focal conclusion.  Posterior circulation: LEFT vertebral artery is dominant. Very poor visualization of the proximal basilar artery, attributed to motion and tortuosity, with normal flow related enhancement of basilar tip. Normal flow early enhancement of the posterior cerebral arteries.  Dolichoectatic appearance intracranial vessels without large vessel occlusion. Limited assessment for aneurysm or vasculopathy due to the degree of motion.  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  Susceptibility artifact in a pattern suggesting sequela of chronic hypertension with severe white matter changes which may be seen with chronic small vessel ischemic disease. Bilateral basal ganglia and thalamus lacunar infarcts.  Bifrontal encephalomalacia, further propagation on the RIGHT, new on the LEFT consistent with interval bilateral posterior cerebral artery territory infarcts.  MRA HEAD: Moderately motion degraded examination, no large vessel occlusion. Slightly decreased flow related enhancement of the LEFT middle cerebral artery. Findings may be better characterized on CTA of the head as patient had difficulty remaining still for the  examination. In addition, poor visualization of the proximal basilar artery which is likely artifact though could be confirmed on CTA of the head.  Dolicoectatic intracranial vessels can be seen with chronic hypertension.   Electronically Signed   By: Awilda Metro   On: 04/02/2014 06:15   Dg Chest Port 1 View  04/01/2014   CLINICAL DATA:  Altered mental status with weakness. Possible stroke. Initial encounter.  EXAM: PORTABLE CHEST - 1 VIEW  COMPARISON:  12/24/2013.  FINDINGS: 1812 hr. There is stable cardiomegaly and vascular ectasia. The lungs are clear. There is no pleural effusion or pneumothorax. No acute osseous findings are seen. Multiple telemetry leads overlie the chest.  IMPRESSION: Stable cardiomegaly.  No acute cardiopulmonary process demonstrated.   Electronically Signed   By: Roxy Horseman M.D.   On: 04/01/2014 18:41   Dg Abd Portable 1v  04/10/2014   CLINICAL DATA:  Feeding tube placement.  EXAM: PORTABLE ABDOMEN - 1 VIEW  COMPARISON:  None.  FINDINGS: A feeding tube is been placed with the tip located at the level of the mid body of the stomach. There is some contrast in the colon. No evidence of bowel obstruction.  IMPRESSION: Feeding tube tip is located at the level of the mid body of the stomach.   Electronically Signed   By: Irish Lack M.D.   On: 04/10/2014 09:36   Dg Abd Portable 1v  04/05/2014   CLINICAL DATA:  NG tube placement  EXAM: PORTABLE ABDOMEN - 1 VIEW  COMPARISON:  04/04/2014  FINDINGS: Feeding tube tip is in the mid portion of the stomach, in similar position to prior study. Nonobstructive bowel gas pattern.  IMPRESSION: Feeding tube tip in the mid portion of the stomach.   Electronically Signed   By: Charlett Nose M.D.   On: 04/05/2014 18:37   Dg Abd Portable 1v  04/04/2014   CLINICAL DATA:  69 year old female undergoing feeding tube placement  EXAM: PORTABLE ABDOMEN - 1 VIEW  COMPARISON:  Chest x-ray 04/02/2014  FINDINGS: Interval placement of a weighted tip  enteric feeding tube. The tip of the tube overlies the body of the stomach. The bowel gas pattern is not obstructed. Atherosclerotic calcifications are present within the abdominal aorta and the iliac arteries. Rotary levoconvex scoliosis with multilevel degenerative disc disease. Surgical changes of bilateral total hip arthroplasties. Incompletely imaged cardiomegaly.  IMPRESSION: The tip of the enteric feeding tube overlies the mid gastric body.   Electronically Signed   By: Malachy Moan M.D.   On: 04/04/2014 14:21   Dg Swallowing Func-speech Pathology  04/06/2014   Riley Nearing [place], CCC-SLP     04/06/2014  3:03 PM  Objective Swallowing Evaluation: Modified Barium Swallowing Study   Patient Details  Name: AKEYLA MOLDEN MRN: [id number] Date of Birth: Apr 25, 1945  Today's Date: 04/06/2014 Time: SLP Start Time (ACUTE ONLY): 1330-SLP Stop Time (ACUTE  ONLY): 1400 SLP Time Calculation (min) (ACUTE ONLY): 30 min  Past Medical History:  Past Medical History  Diagnosis Date  . Hypertension   . 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 Expressive aphasia 03/23/2013  . Atrial fibrillation 05/01/2013  . Tobacco abuse 05/01/2013  . Diabetes mellitus without complication   . Depression   . Stroke     2015   Past Surgical History:  Past Surgical History  Procedure Laterality Date  . Tee without cardioversion N/A 03/26/2013    Procedure: TRANSESOPHAGEAL ECHOCARDIOGRAM (TEE);  Surgeon:  Thurmon Fair, MD;  Location: [facility] ENDOSCOPY;  Service:  Cardiovascular;  Laterality: N/A;  . Cesarean section      x3  . Hip replacement Right 1990's  . Hip replacemet Left 1990's   HPI:  HPI: 69 y.o. female with a past medical history of hypertension,  atrial fibrillation, Fabry disease (Fabry disease causes a  buildup of fatty material in the autonomic nervous system, eyes,  kidneys, and cardiovascular system) , hx of tobacco abuse,  diabetes mellitus, stroke, depression, who presents with aphasia.  MRI Acute on chronic LEFT frontal lobe infarct  (middle cerebral  artery territory. Bilateral basal ganglia and thalamus lacunar  infarcts. Recently hospitalized with stroke with expressive  aphasia and mild weakness on the right side. Failed RN stroke  swallow scren.  No Data Recorded  Assessment / Plan / Recommendation CHL IP CLINICAL IMPRESSIONS 04/06/2014  Dysphagia Diagnosis Moderate pharyngeal phase dysphagia;Moderate  oral phase dysphagia  Clinical impression Pt demonstrates a primary oral phase  dysphagia with reduced ability to initiate oral movements for PO  intake. Pt requires moderate assist for cup to mouth and  appropriate head posture (tilts head back). There is right sided  oral weakness with moderate residuals remaining post swallow.  Moderate sensorimotor oropharyngeal impairment is characterized  by significantly delayed swallow response and pt silently  aspirates thin liquids before the swallow (trace). Suspect larger  amount of aspirate would elicit strong cough as seen at bedside.  Pt tolerates nectar thick liquids and purees without penetration  or aspiration though bolus size does need to be supervised to  decrease risk. Pt should also be cued to attempt transit of oral  residuals with second swallow, or suction to right side should be  applied intermittently during meals. Recommend initiating a Dys 1  (puree) diet with nectar thick liquids with full supervision.       CHL IP TREATMENT RECOMMENDATION 04/06/2014  Treatment Plan Recommendations Therapy as outlined in treatment  plan below     CHL IP DIET RECOMMENDATION 04/06/2014  Diet Recommendations Dysphagia 1 (Puree);Nectar-thick liquid  Liquid Administration via Cup Medication Administration Whole  meds with puree  Compensations Slow rate;Small sips/bites;Check for  pocketing;Check for anterior loss;Multiple dry swallows after  each bite/sip  Postural Changes and/or Swallow Maneuvers Seated upright 90  degrees;Out of bed for meals     CHL IP OTHER RECOMMENDATIONS 04/06/2014  Recommended  Consults (None)  Oral Care Recommendations Oral care BID  Other Recommendations Order thickener from pharmacy;Have oral  suction available     CHL IP FOLLOW UP RECOMMENDATIONS 04/06/2014  Follow up Recommendations Inpatient Rehab     CHL IP FREQUENCY AND DURATION 04/06/2014  Speech Therapy Frequency (ACUTE ONLY) min 2x/week  Treatment Duration 2 weeks     Pertinent Vitals/Pain NA    SLP Swallow Goals No flowsheet data found.  No flowsheet data found.    CHL IP REASON FOR REFERRAL 04/06/2014  Reason for Referral Objectively evaluate swallowing function     CHL IP ORAL PHASE  04/06/2014  Lips (None)  Tongue (None)  Mucous membranes (None)  Nutritional status (None)  Other (None)  Oxygen therapy (None)  Oral Phase Impaired  Oral - Pudding Teaspoon (None)  Oral - Pudding Cup (None)  Oral - Honey Teaspoon (None)  Oral - Honey Cup (None)  Oral - Honey Syringe (None)  Oral - Nectar Teaspoon (None)  Oral - Nectar Cup Right anterior bolus loss;Weak lingual  manipulation;Lingual pumping;Reduced posterior propulsion;Right  pocketing in lateral sulci;Lingual/palatal residue;Delayed oral  transit  Oral - Nectar Straw Other (Comment)  Oral - Nectar Syringe (None)  Oral - Ice Chips (None)  Oral - Thin Teaspoon (None)  Oral - Thin Cup Right anterior bolus loss;Weak lingual  manipulation;Lingual pumping;Reduced posterior propulsion;Right  pocketing in lateral sulci;Lingual/palatal residue;Delayed oral  transit  Oral - Thin Straw (None)  Oral - Thin Syringe (None)  Oral - Puree Right anterior bolus loss;Weak lingual  manipulation;Lingual pumping;Reduced posterior propulsion;Right  pocketing in lateral sulci;Delayed oral transit  Oral - Mechanical Soft (None)  Oral - Regular Right anterior bolus loss;Weak lingual  manipulation;Lingual pumping;Reduced posterior propulsion;Right  pocketing in lateral sulci;Lingual/palatal residue;Delayed oral  transit;Impaired mastication  Oral - Multi-consistency (None)  Oral - Pill Weak lingual  manipulation;Lingual pumping;Reduced  posterior propulsion;Right pocketing in lateral sulci;Delayed  oral transit  Oral Phase - Comment (None)      CHL IP PHARYNGEAL PHASE 04/06/2014  Pharyngeal Phase Impaired  Pharyngeal - Pudding Teaspoon (None)  Penetration/Aspiration details (pudding teaspoon) (None)  Pharyngeal - Pudding Cup (None)  Penetration/Aspiration details (pudding cup) (None)  Pharyngeal - Honey Teaspoon (None)  Penetration/Aspiration details (honey teaspoon) (None)  Pharyngeal - Honey Cup (None)  Penetration/Aspiration details (honey cup) (None)  Pharyngeal - Honey Syringe (None)  Penetration/Aspiration details (honey syringe) (None)  Pharyngeal - Nectar Teaspoon (None)  Penetration/Aspiration details (nectar teaspoon) (None)  Pharyngeal - Nectar Cup Delayed swallow initiation;Pharyngeal  residue - valleculae  Penetration/Aspiration details (nectar cup) Material does not  enter airway  Pharyngeal - Nectar Straw (None)  Penetration/Aspiration details (nectar straw) (None)  Pharyngeal - Nectar Syringe (None)  Penetration/Aspiration details (nectar syringe) (None)  Pharyngeal - Ice Chips (None)  Penetration/Aspiration details (ice chips) (None)  Pharyngeal - Thin Teaspoon (None)  Penetration/Aspiration details (thin teaspoon) (None)  Pharyngeal - Thin Cup Delayed swallow  initiation;Penetration/Aspiration before swallow;Trace  aspiration;Pharyngeal residue - valleculae  Penetration/Aspiration details (thin cup) Material enters airway,  passes BELOW cords without attempt by patient to eject out  (silent aspiration)  Pharyngeal - Thin Straw (None)  Penetration/Aspiration details (thin straw) (None)  Pharyngeal - Thin Syringe (None)  Penetration/Aspiration details (thin syringe') (None)  Pharyngeal - Puree Delayed swallow initiation  Penetration/Aspiration details (puree) (None)  Pharyngeal - Mechanical Soft (None)  Penetration/Aspiration details (mechanical soft) (None)  Pharyngeal - Regular Delayed swallow  initiation  Penetration/Aspiration details (regular) (None)  Pharyngeal - Multi-consistency (None)  Penetration/Aspiration details (multi-consistency) (None)  Pharyngeal - Pill Delayed swallow initiation  Penetration/Aspiration details (pill) (None)  Pharyngeal Comment (None)     No flowsheet data found.  No flowsheet data found.        [name], [place] CCC-SLP [phone]  Dyanne Iha Riley Nearing 04/06/2014, 3:01 PM    Mr Maxine Glenn Head/brain Wo Cm  04/02/2014   CLINICAL DATA:  Recent hospitalization for stroke, found noncommunicating and shivering today.  EXAM: MRI HEAD WITHOUT CONTRAST  MRA HEAD WITHOUT CONTRAST  TECHNIQUE: Multiplanar, multiecho pulse sequences of the brain and surrounding structures were obtained without intravenous contrast. Angiographic images of the head were  obtained using MRA technique without contrast.  COMPARISON:  CT of the head March 24, 2013 and CT of the head April 01, 2014  FINDINGS: MRI HEAD FINDINGS  Moderately motion degraded examination. Reduced diffusion in LEFT frontal lobe, spanning greater territory than on prior MRI. Corresponding low ADC values. Multiple subcentimeter central with a lesser extent peripheral foci of susceptibility artifact, increased from prior examination though, in a similar distribution.  Moderate ventriculomegaly, likely on the basis of global parenchymal brain volume loss as there is overall commensurate enlargement of cerebral sulci and cerebellar folia. Bilateral small cerebellar infarcts, relatively unchanged. Bilateral occipital lobe encephalomalacia, advanced on the RIGHT, new on the LEFT. Severe confluent supratentorial white matter FLAIR T2 hyperintensities without mass effect. Cystic encephalomalacia of the LEFT frontal lobe underlying the area of T2 bright cytotoxic edema. Bilateral basal ganglia and thalamus lacunar infarcts.  No abnormal extra-axial fluid collections. Status post bilateral ocular lens implants. RIGHT maxillary mucosal  retention cyst without paranasal sinus air-fluid levels. The mastoid air cells are well aerated. Mild sellar expansion, can be seen with arachnoid cyst or less likely empty sella considering a thin rind of apparent pituitary tissue along the floor. No cerebellar tonsillar ectopia. No suspicious calvarial bone marrow signal.  MRA HEAD FINDINGS  Moderately motion degraded examination.  Anterior circulation: Flow related enhancement observed within the cervical, petrous, cavernous and supra clinoid internal carotid arteries. Due to motion, there is a duplicated appearance of the carotid siphons. Flow related enhancement within the anterior cerebral arteries, robust flow related enhancement of the RIGHT middle cerebral artery, somewhat less robust flow related enhancement of LEFT middle cerebral artery without focal conclusion.  Posterior circulation: LEFT vertebral artery is dominant. Very poor visualization of the proximal basilar artery, attributed to motion and tortuosity, with normal flow related enhancement of basilar tip. Normal flow early enhancement of the posterior cerebral arteries.  Dolichoectatic appearance intracranial vessels without large vessel occlusion. Limited assessment for aneurysm or vasculopathy due to the degree of motion.  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  Susceptibility artifact in a pattern suggesting sequela of chronic hypertension with severe white matter changes which may be seen with chronic small vessel ischemic disease. Bilateral basal ganglia and thalamus lacunar infarcts.  Bifrontal encephalomalacia, further propagation on the RIGHT, new on the LEFT consistent with interval bilateral posterior cerebral artery territory infarcts.  MRA HEAD: Moderately motion degraded examination, no large vessel occlusion. Slightly decreased flow related enhancement of the LEFT middle cerebral artery. Findings may be better  characterized on CTA of the head as patient had difficulty remaining still for the examination. In addition, poor visualization of the proximal basilar artery which is likely artifact though could be confirmed on CTA of the head.  Dolicoectatic intracranial vessels can be seen with chronic hypertension.   Electronically Signed   By: Awilda Metro   On: 04/02/2014 06:15    Labs:  CBC:  Recent Labs  04/03/14 1005 04/05/14 0305 04/06/14 0359 04/08/14 0730  WBC 5.6 4.4 4.1 4.6  HGB 12.4 12.3 13.9 12.7  HCT 37.5 37.1 41.7 38.7  PLT 200 205 214 209    COAGS: No results for input(s): INR, APTT in the last 8760 hours.  BMP:  Recent Labs  04/02/14 0420 04/03/14 1005 04/07/14 0306 04/08/14 0730  NA 145 141 144 144  K 3.9 4.0 3.9 4.1  CL 109 102 105 104  CO2 23 29 31 27   GLUCOSE 149* 117*  96 97  BUN 19 18 20  26*  CALCIUM 9.5 9.1 8.9 9.5  CREATININE 0.97 1.03 0.90 1.00  GFRNONAA 58* 54* 64* 56*  GFRAA 68* 63* 74* 65*    LIVER FUNCTION TESTS:  Recent Labs  04/01/14 1745 04/03/14 1005 04/07/14 0306 04/08/14 0730  BILITOT 0.9 0.8 0.7 0.9  AST 38* 36 28 35  ALT 15 16 15 19   ALKPHOS 36* 35* 34* 37*  PROT 7.5 7.3 6.4 7.5  ALBUMIN 3.9 3.6 3.0* 3.6    TUMOR MARKERS: No results for input(s): AFPTM, CEA, CA199, CHROMGRNA in the last 8760 hours.  Assessment and Plan:  CVA x2 Aphasia Dysphagia Malnutrition FTT Need for long term care Scheduled for percutaneous gastric tube placement 1/29 Off eloquis since 1/26 pm Ancef on call afeb Check labs   Thank you for this interesting consult.  I greatly enjoyed meeting Rebecca [name] and look forward to participating in their care.  Signed: Xolani Degracia A 04/16/2014, 11:01 AM   I spent a total of 40 minutes face to face in clinical consultation, greater than 50% of which was counseling/coordinating care for perc G tube placement

## 2014-04-16 NOTE — Patient Care Conference (Signed)
Inpatient RehabilitationTeam Conference and Plan of Care Update Date: 04/15/2014   Time: 10:35 AM    Patient Name: Rebecca [name]      Medical Record Number: [id number]  Date of Birth: 04-Jan-1946 Sex: Female         Room/Bed: 4W23C/4W23C-01 Payor Info: Payor: MEDICARE / Plan: MEDICARE PART A AND B / Product Type: *No Product type* /    Admitting Diagnosis: L FRONTAL CVA  Admit Date/Time:  04/07/2014  4:53 PM Admission Comments: No comment available   Primary Diagnosis:  Cardioembolic stroke Principal Problem: Cardioembolic stroke  Patient Active Problem List   Diagnosis Date Noted  . 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 Cardioembolic stroke 04/07/2014  . Protein-calorie malnutrition, severe 04/05/2014  . Global aphasia   . Paroxysmal atrial fibrillation   . Hyperlipidemia   . Acute diastolic heart failure 04/02/2014  . Elevated troponin I level   . Essential hypertension   . Acute on chronic combined systolic and diastolic CHF (congestive heart failure)   . Pulmonary hypertension   . Chronic atrial fibrillation   . Diabetes type 2, controlled   . HLD (hyperlipidemia)   . Elevated troponin 04/01/2014  . Stroke 04/01/2014  . Aphasia 04/01/2014  . 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 Atrial fibrillation 05/01/2013  . Tobacco abuse 05/01/2013  . Expressive aphasia 03/23/2013  . Diabetes mellitus 03/23/2013  . Hypertension 03/23/2013  . Decreased calculated GFR 03/23/2013    Expected Discharge Date:  SNF  Team Members Present: Physician leading conference: Dr. Claudette Laws Social Worker Present: Staci Acosta, LCSW Nurse Present: Carmie End, RN PT Present: Wanda Plump, PT;Blair Hobble, PT OT Present: Primitivo Gauze, Suszanne Conners, OT SLP Present: Jackalyn Lombard, SLP PPS Coordinator present : Tora Duck, RN, CRRN     Current Status/Progress Goal Weekly Team Focus  Medical   severe apraxia, poor intake  adequate nutrition  PEG placement    Bowel/Bladder   incontinent of bowel and bladder. initating timed tolieting with pt refusing at times.   manage b/b min assist  timed toileting   Swallow/Nutrition/ Hydration   Dys 1, nectar thick liquids; little to no PO intake  Max assist with least restrictive diet   trials of advanced consistencies with improved sustained attention to facilitate improved PO intake    ADL's   Pt has not progressed due to severely impaired initiation, attention, apraxia and engagement in therapy. Max with bathing (mod on admission), total dressing, toileting; min A transfers  supervision standing balance and toilet transfers; min A bathing, dressing and toileting  ADL retraining, functional mobility, balance, RUE functional use, cognition - initiation, attention   Mobility   S transfers, Min A gait with HHA x150', 5 stairs with S  supervision basic and car transfers, gait x 150' and up/down 5 steps 2 rails  Activity tolerance, balance training, communication,    Communication   nonverbal except for intermittent 1 word utterances  Max for multimodal communication   improving functional communication via any modality    Safety/Cognition/ Behavioral Observations  max-total assist for initiation, sustained attention, sequencing, basic functional problem solving   max assist   improve sustained attention.    Pain   no complaints of pain         Skin   CDI          Rehab Goals Patient on target to meet rehab goals: Yes Rehab Goals Revised: none *See Care Plan and progress notes for long and short-term goals.  Barriers to Discharge: Refused treatments    Possible Resolutions to Barriers:  prob SNF    Discharge Planning/Teaching Needs:  Pt will continue to need rehab after CIR.  Pt's son, [name], is agreeable to SNF transfer.  None needed due to plan for SNF at d/c.   Team Discussion:  Pt has no new medical issues, but is still having difficulty eating enough to sustain her and she will most likely  need a PEG until her apraxia improves.  ST feels pt does better with less distractions and less cues.  She wonders how much of her disinterest is actually due to depression or behavioral in some way, as pt does not fully understand why she is here and who we are.  Apraxia slightly better today with magic cup.  Pt is allowing OT more to complete her oral care, but pt is not assisting.  Pt is reaching PT goals and is doing well physically.  Pt refuses to toilet with nursing, despite their attempts.  They continue to try.  Revisions to Treatment Plan:  Therapies will be qd   Continued Need for Acute Rehabilitation Level of Care: The patient requires daily medical management by a physician with specialized training in physical medicine and rehabilitation for the following conditions: Daily direction of a multidisciplinary physical rehabilitation program to ensure safe treatment while eliciting the highest outcome that is of practical value to the patient.: Yes Daily medical management of patient stability for increased activity during participation in an intensive rehabilitation regime.: Yes Daily analysis of laboratory values and/or radiology reports with any subsequent need for medication adjustment of medical intervention for : Neurological problems  Jaquane Boughner, Vista Deck 04/16/2014, 10:18 AM

## 2014-04-16 NOTE — Progress Notes (Signed)
Occupational Therapy Weekly Progress Note  Patient Details  Name: Rebecca [name] MRN: [id number] Date of Birth: 06-10-1945  Beginning of progress report period: April 08, 2014 End of progress report period: April 16, 2014  Today's Date: 04/16/2014 OT Individual Time: 1000-1100 OT Individual Time Calculation (min): 60 min   Each session this week has required a great deal of prompting, verbally and tactally, to have pt engage in very simple tasks such as washing her face or her chest.  Pt has been able to stand up, walk with a RW and transfer to toilet and shower chair all with S. She needs a great deal of time to initiate and to complete these movements and needs strong coaxing to walk to toilet and shower. She often refuses to do things and will push the therapist away or turn her head and not make eye contact.  Therefore she requires max to total A with all of her basic self care.  Patient continues to demonstrate the following deficits:global aphasia, apraxia, RUE hemiparesis with motor impersistance and incoordination, and poor participation and therefore will continue to benefit from skilled OT intervention to enhance overall performance with Reduce care partner burden.  Patient not progressing toward long term goals.  See goal revision..  Plan of care revisions: Pt's bathing, dressing, and toileting goals downgraded to max A.. Grooming down graded to mod A and eating min A. Pt is not actively participating without max cues and strong prompting.   OT Short Term Goals Week 1:   No short term goals set due to expected short LOS. Week 2:  OT Short Term Goal 1 (Week 2): STGs were not set as pt was to have a short LOS. Awaiting nursing home placement with a therapy goal of pt participating in session with less than 75% cues.  Skilled Therapeutic Interventions/Progress Updates:    Pt seen for BADL retraining focusing on participation.  Pt initially refused to get out of her recliner. A  walker was placed in front of pt and suggested to pt that she walk into the bathroom. Therapist stood back and pt walked in very slowly but without physical A.  Shower water turned on to encourage pt to walk to shower. She refused to remove her gown, even after numerous cues. Once pt was in shower with soaking wet gown, she allowed this therapist to remove it. Therapist needed to assist with bathing her except for perineal area as pt kept pushing her hand down. She does seem to under stand some commands or statements. Pt was given a shirt and asked to complete donning it after her R arm was place in.  This therapist "walked away" so pt could not see her and pt completed the task efficiently. Pt was also compliant with the donning of TED hose. Pt sat back in recliner and was taken to the nursing station with quick release belt on.     Therapy Documentation Precautions:  Precautions Precautions: Fall Precaution Comments: pt is unstready on her feet. pt apparently knows how to turn off bed alarm Restrictions Weight Bearing Restrictions: No Pain: Pain Assessment Pain Assessment: No/denies pain ADL: ADL ADL Comments: Refer to FIM  See FIM for current functional status  Therapy/Group: Individual Therapy  SAGUIER,JULIA 04/16/2014, 11:45 AM

## 2014-04-17 ENCOUNTER — Encounter (HOSPITAL_COMMUNITY): Payer: Self-pay | Admitting: General Surgery

## 2014-04-17 ENCOUNTER — Inpatient Hospital Stay (HOSPITAL_COMMUNITY): Payer: Self-pay | Admitting: Speech Pathology

## 2014-04-17 ENCOUNTER — Inpatient Hospital Stay (HOSPITAL_COMMUNITY): Payer: Medicare Other | Admitting: Occupational Therapy

## 2014-04-17 ENCOUNTER — Inpatient Hospital Stay (HOSPITAL_COMMUNITY): Payer: Self-pay | Admitting: Physical Therapy

## 2014-04-17 ENCOUNTER — Inpatient Hospital Stay (HOSPITAL_COMMUNITY): Payer: Medicare Other

## 2014-04-17 LAB — GLUCOSE, CAPILLARY
GLUCOSE-CAPILLARY: 81 mg/dL (ref 70–99)
Glucose-Capillary: 106 mg/dL — ABNORMAL HIGH (ref 70–99)
Glucose-Capillary: 80 mg/dL (ref 70–99)
Glucose-Capillary: 84 mg/dL (ref 70–99)
Glucose-Capillary: 87 mg/dL (ref 70–99)
Glucose-Capillary: 88 mg/dL (ref 70–99)

## 2014-04-17 MED ORDER — HEPARIN (PORCINE) IN NACL 100-0.45 UNIT/ML-% IJ SOLN
900.0000 [IU]/h | INTRAMUSCULAR | Status: DC
  Administered 2014-04-17: 800 [IU]/h via INTRAVENOUS
  Administered 2014-04-18: 900 [IU]/h via INTRAVENOUS
  Filled 2014-04-17 (×2): qty 250

## 2014-04-17 MED ORDER — GLUCAGON HCL RDNA (DIAGNOSTIC) 1 MG IJ SOLR
INTRAMUSCULAR | Status: AC
  Filled 2014-04-17: qty 1

## 2014-04-17 MED ORDER — CEFAZOLIN SODIUM-DEXTROSE 2-3 GM-% IV SOLR
INTRAVENOUS | Status: AC
  Filled 2014-04-17: qty 50

## 2014-04-17 MED ORDER — FENTANYL CITRATE 0.05 MG/ML IJ SOLN
INTRAMUSCULAR | Status: AC
  Filled 2014-04-17: qty 2

## 2014-04-17 MED ORDER — LIDOCAINE HCL 1 % IJ SOLN
INTRAMUSCULAR | Status: AC
  Filled 2014-04-17: qty 20

## 2014-04-17 MED ORDER — IOHEXOL 300 MG/ML  SOLN
50.0000 mL | Freq: Once | INTRAMUSCULAR | Status: AC | PRN
  Administered 2014-04-17: 1 mL via INTRAVENOUS

## 2014-04-17 MED ORDER — HEPARIN BOLUS VIA INFUSION
3000.0000 [IU] | Freq: Once | INTRAVENOUS | Status: AC
  Administered 2014-04-17: 3000 [IU] via INTRAVENOUS
  Filled 2014-04-17: qty 3000

## 2014-04-17 MED ORDER — MIDAZOLAM HCL 2 MG/2ML IJ SOLN
INTRAMUSCULAR | Status: AC
  Filled 2014-04-17: qty 2

## 2014-04-17 MED ORDER — SODIUM CHLORIDE 0.45 % IV SOLN
INTRAVENOUS | Status: DC
  Administered 2014-04-17 – 2014-04-19 (×4): via INTRAVENOUS
  Administered 2014-04-19: 75 mL/h via INTRAVENOUS
  Administered 2014-04-20 – 2014-04-22 (×3): via INTRAVENOUS

## 2014-04-17 NOTE — Sedation Documentation (Signed)
No procedure done and no sedation given

## 2014-04-17 NOTE — Sedation Documentation (Signed)
Radiologist decides not to proceed with Gtube placement

## 2014-04-17 NOTE — Procedures (Signed)
Interventional Radiology Procedure Note  Procedure: Patient was brought to IR for fluoro-guided percutaneous gastrostomy.  Procedure was not completed, as the anatomy is not conducive to percutaneous placement.  The splenic flexure overlies the anterior stomach. Findings: Once the stomach was inflated, the splenic flexure was overlying the anterior stomach.   Recommendations:   -- Recommend evaluation by surgery for potential surgical gastrostomy placement.   Signed,  Yvone Neu. Loreta Ave, DO

## 2014-04-17 NOTE — Sedation Documentation (Signed)
RN notified and will be transferred back to room

## 2014-04-17 NOTE — Progress Notes (Signed)
Speech Language Pathology Daily Session Note  Patient Details  Name: Rebecca [name] MRN: [id number] Date of Birth: [birth year]  Today's Date: 04/17/2014 SLP Individual Time: 1130-1200 SLP Individual Time Calculation (min): 30 min  Short Term Goals: Week 2: SLP Short Term Goal 1 (Week 2): Pt will initiate a timely swallow response over 75% of observable opportunities during presentations of her currently prescribed diet with mod-max assist multimodal cuing.   SLP Short Term Goal 2 (Week 2): Pt will sustain attention during basic, familiar self care tasks for 2-3 minutes with max assist.   SLP Short Term Goal 3 (Week 2): Pt will follow 1 step commands for 75% accuracy with max assist multimodal cuing.  SLP Short Term Goal 4 (Week 2): Pt will improve functional communication via any modality (i.e. gestures, pointing, facial expressions, vocalization) during structured tasks with mod assist multimodal cuing.   Skilled Therapeutic Interventions:  Pt was seen for skilled ST targeting cognitive goals. Pt was received in wheelchair from RN station and required max-total encouragement for participation; however, participation improved as session progressed.  Pt had been incontinent of urine in brief prior to arrival and SLP facilitated the session with mod multimodal/contextual cues for following 1 and 2 step commands during toileting/hygiene.  Pt also required mod-max assist multimodal cues for initiation and sequencing of basic self care tasks.  Pt returned to bed per nursing request in preparation for PEG placement following therapy sessions.  Continue per current plan of care.       FIM:  Comprehension Comprehension Mode: Auditory Comprehension: 2-Understands basic 25 - 49% of the time/requires cueing 51 - 75% of the time Expression Expression Mode: Nonverbal Expression: 2-Expresses basic 25 - 49% of the time/requires cueing 50 - 75% of the time. Uses single words/gestures. Social  Interaction Social Interaction: 2-Interacts appropriately 25 - 49% of time - Needs frequent redirection. Problem Solving Problem Solving: 2-Solves basic 25 - 49% of the time - needs direction more than half the time to initiate, plan or complete simple activities Memory Memory: 1-Recognizes or recalls less than 25% of the time/requires cueing greater than 75% of the time  Pain Pain Assessment Pain Assessment: No/denies pain  Therapy/Group: Individual Therapy  Isaic Syler, Melanee Spry 04/17/2014, 4:08 PM

## 2014-04-17 NOTE — Progress Notes (Signed)
As per note by interventional radiology attempts at placing gastrostomy tube failed due to anatomy as documented by Dr. Loreta Ave with interventional radiology. Request made for general surgery participation evaluation for placement of gastrostomy tube. At the request at this time continue to hold Eliquis and resume diet of dysphagia 1 diet or liquids. They will follow up on recommendations later today

## 2014-04-17 NOTE — Progress Notes (Signed)
Physical Therapy Session Note  Patient Details  Name: Rebecca [name] MRN: [id number] Date of Birth: 06-Jul-1945  Today's Date: 04/17/2014 PT Individual Time: 0935-1005 PT Individual Time Calculation (min): 30 min   Short Term Goals: Week 1:  PT Short Term Goal 1 (Week 1): = LTGs due to LOS  Skilled Therapeutic Interventions/Progress Updates:    Pt received seated in w/c at nurses' station. Transported pt in w/c in gym with total A. With demonstration of stair negotiation, pt formed sit<>stand from w/c and negotiated 5 stairs with 2 rails, forward-facing with step-to pattern and supervision. Transported pt to rehab apartment, where pt performed stand pivot transfer from w/c<>bed with supervision demonstration cueing. Pt not agreeable to performing bed mobility in rehab apartment; therefore, performed functional ambulation x250' total in controlled and home environments; initial 71' with min A, R HHA; subsequent 200' with pt pushing own w/c requiring supervision for stability balance. Of note, pt did require min A during dynamic standing to avoid posterior LOB. Pt left seated in w/c at nurses' station with quick release belt in place for safety and all needs within reach,  Therapy Documentation Precautions:  Precautions Precautions: Fall Precaution Comments: pt is unstready on her feet. pt apparently knows how to turn off bed alarm Restrictions Weight Bearing Restrictions: No Vital Signs: Therapy Vitals Temp: 98.5 F (36.9 C) Temp Source: Oral Pulse Rate: (!) 57 Resp: 18 BP: 128/72 mmHg Patient Position (if appropriate): Lying Oxygen Therapy SpO2: 94 % O2 Device: Not Delivered Pain: Pain Assessment Pain Assessment: Faces Faces Pain Scale: No hurt Locomotion : Ambulation Ambulation/Gait Assistance: 5: Supervision;4: Min guard;4: Min assist   See FIM for current functional status  Therapy/Group: Individual Therapy  Hobble, Lorenda Ishihara 04/17/2014, 10:14 AM

## 2014-04-17 NOTE — Progress Notes (Signed)
Subjective/Complaints: Remains expressively aphasic, following simple commands more consistently Review of Systems - cannot obtain secondary to aphasia  Objective: Vital Signs: Blood pressure 128/72, pulse 57, temperature 98.5 F (36.9 C), temperature source Oral, resp. rate 18, weight 61.2 kg (134 lb 14.7 oz), SpO2 94 %. Ct Abdomen Wo Contrast  04/15/2014   CLINICAL DATA:  Dysphagia, evaluate for gastric tube placement  EXAM: CT ABDOMEN WITHOUT CONTRAST  TECHNIQUE: Multidetector CT imaging of the abdomen was performed following the standard protocol without IV contrast.  COMPARISON:  None.  FINDINGS: Lower chest:  Mild linear atelectasis in the medial left lower lobe.  Cardiomegaly.  Hepatobiliary: Liver is unremarkable.  Layering sludge in the gallbladder. No intrahepatic or extrahepatic ductal dilatation.  Pancreas: Pancreatic atrophy.  Spleen: Within normal limits.  Adrenals/Urinary Tract: Adrenal glands are unremarkable.  6 mm nonobstructing left renal calculus (coronal image 66). Right kidney is unremarkable. No hydronephrosis.  Stomach/Bowel: Stomach is unremarkable.  Portions of the transverse colon traverses anterior to the gastric body.  Visualized bowel is otherwise grossly unremarkable.  Vascular/Lymphatic: Atherosclerotic calcifications of the abdominal aorta.  No suspicious abdominal lymphadenopathy.  Other:  No abdominal ascites.  Musculoskeletal: Degenerative changes of the visualized thoracolumbar spine with dextroscoliosis.  IMPRESSION: 6 mm nonobstructing left renal calculus.  No hydronephrosis.  Portions of the transverse colon traverses anterior to the gastric body.  Otherwise unremarkable CT abdomen.   Electronically Signed   By: Julian Hy M.D.   On: 04/15/2014 17:26   Results for orders placed or performed during the hospital encounter of 04/07/14 (from the past 72 hour(s))  Glucose, capillary     Status: Abnormal   Collection Time: 04/14/14 12:23 PM  Result Value Ref  Range   Glucose-Capillary 140 (H) 70 - 99 mg/dL   Comment 1 Notify RN   Glucose, capillary     Status: Abnormal   Collection Time: 04/14/14  4:57 PM  Result Value Ref Range   Glucose-Capillary 108 (H) 70 - 99 mg/dL  Glucose, capillary     Status: Abnormal   Collection Time: 04/14/14  8:09 PM  Result Value Ref Range   Glucose-Capillary 152 (H) 70 - 99 mg/dL  Glucose, capillary     Status: Abnormal   Collection Time: 04/14/14 11:45 PM  Result Value Ref Range   Glucose-Capillary 113 (H) 70 - 99 mg/dL  Glucose, capillary     Status: Abnormal   Collection Time: 04/15/14  4:02 AM  Result Value Ref Range   Glucose-Capillary 135 (H) 70 - 99 mg/dL  Glucose, capillary     Status: Abnormal   Collection Time: 04/15/14  7:04 AM  Result Value Ref Range   Glucose-Capillary 114 (H) 70 - 99 mg/dL  Glucose, capillary     Status: None   Collection Time: 04/15/14 11:56 AM  Result Value Ref Range   Glucose-Capillary 98 70 - 99 mg/dL  Glucose, capillary     Status: None   Collection Time: 04/15/14  4:31 PM  Result Value Ref Range   Glucose-Capillary 98 70 - 99 mg/dL  Glucose, capillary     Status: Abnormal   Collection Time: 04/15/14  8:28 PM  Result Value Ref Range   Glucose-Capillary 119 (H) 70 - 99 mg/dL   Comment 1 Notify RN   Glucose, capillary     Status: None   Collection Time: 04/16/14 12:03 AM  Result Value Ref Range   Glucose-Capillary 93 70 - 99 mg/dL   Comment 1 Notify RN  Glucose, capillary     Status: None   Collection Time: 04/16/14  4:02 AM  Result Value Ref Range   Glucose-Capillary 96 70 - 99 mg/dL   Comment 1 Notify RN   Glucose, capillary     Status: Abnormal   Collection Time: 04/16/14  8:57 AM  Result Value Ref Range   Glucose-Capillary 102 (H) 70 - 99 mg/dL   Comment 1 Notify RN   Glucose, capillary     Status: Abnormal   Collection Time: 04/16/14 11:58 AM  Result Value Ref Range   Glucose-Capillary 115 (H) 70 - 99 mg/dL   Comment 1 Notify RN   CBC      Status: Abnormal   Collection Time: 04/16/14 12:55 PM  Result Value Ref Range   WBC 3.7 (L) 4.0 - 10.5 K/uL   RBC 4.48 3.87 - 5.11 MIL/uL   Hemoglobin 12.3 12.0 - 15.0 g/dL   HCT 37.6 36.0 - 46.0 %   MCV 83.9 78.0 - 100.0 fL   MCH 27.5 26.0 - 34.0 pg   MCHC 32.7 30.0 - 36.0 g/dL   RDW 15.3 11.5 - 15.5 %   Platelets 254 150 - 400 K/uL  Protime-INR     Status: Abnormal   Collection Time: 04/16/14 12:55 PM  Result Value Ref Range   Prothrombin Time 16.0 (H) 11.6 - 15.2 seconds   INR 1.27 0.00 - 1.49  APTT     Status: None   Collection Time: 04/16/14 12:55 PM  Result Value Ref Range   aPTT 29 24 - 37 seconds  Basic metabolic panel     Status: Abnormal   Collection Time: 04/16/14 12:55 PM  Result Value Ref Range   Sodium 141 135 - 145 mmol/L   Potassium 4.3 3.5 - 5.1 mmol/L   Chloride 106 96 - 112 mmol/L   CO2 28 19 - 32 mmol/L   Glucose, Bld 120 (H) 70 - 99 mg/dL   BUN 30 (H) 6 - 23 mg/dL   Creatinine, Ser 1.09 0.50 - 1.10 mg/dL   Calcium 9.2 8.4 - 10.5 mg/dL   GFR calc non Af Amer 51 (L) >90 mL/min   GFR calc Af Amer 59 (L) >90 mL/min    Comment: (NOTE) The eGFR has been calculated using the CKD EPI equation. This calculation has not been validated in all clinical situations. eGFR's persistently <90 mL/min signify possible Chronic Kidney Disease.    Anion gap 7 5 - 15  Glucose, capillary     Status: Abnormal   Collection Time: 04/16/14  5:02 PM  Result Value Ref Range   Glucose-Capillary 100 (H) 70 - 99 mg/dL   Comment 1 Notify RN   Glucose, capillary     Status: None   Collection Time: 04/16/14  8:15 PM  Result Value Ref Range   Glucose-Capillary 98 70 - 99 mg/dL   Comment 1 Documented in Chart    Comment 2 Notify RN   Glucose, capillary     Status: Abnormal   Collection Time: 04/17/14 12:25 AM  Result Value Ref Range   Glucose-Capillary 106 (H) 70 - 99 mg/dL  Glucose, capillary     Status: None   Collection Time: 04/17/14  3:57 AM  Result Value Ref Range    Glucose-Capillary 80 70 - 99 mg/dL  Glucose, capillary     Status: None   Collection Time: 04/17/14  6:36 AM  Result Value Ref Range   Glucose-Capillary 88 70 - 99 mg/dL  HEENT: normal Cardio: RRR and no murmurs Resp: CTA B/L and unlabored GI: BS positive and nontender nondistended Extremity:  Pulses positive and No Edema Skin:   Intact Neuro: Alert/Oriented, Abnormal Sensory unable to assess secondary to aphasia and Abnormal Motor 4/5 bilateral upper and lower extremities. Now able to do MMT with verbal commands Musc/Skel:  Other no pain with upper or lower extremity active range of motion, no joint swelling noted Gen. no acute distress   Assessment/Plan: 1. 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 Physiatrist is providing close team supervision and 24 hour management of active medical problems listed below. Physiatrist and rehab team continue to assess barriers to discharge/monitor patient progress toward functional and medical goals. Appreciate IR note. Plan for PEG today. Hold Eliquis until  after procedure and oked by IR to resume FIM: FIM - Bathing Bathing Steps Patient Completed: Front perineal area Bathing: 1: Total-Patient completes 0-2 of 10 parts or less than 25%  FIM - Upper Body Dressing/Undressing Upper body dressing/undressing steps patient completed: Pull shirt over trunk, Thread/unthread left sleeve of pullover shirt/dress, Put head through opening of pull over shirt/dress Upper body dressing/undressing: 4: Min-Patient completed 75 plus % of tasks FIM - Lower Body Dressing/Undressing Lower body dressing/undressing steps patient completed: Pull pants up/down Lower body dressing/undressing: 1: Total-Patient completed less than 25% of tasks  FIM - Toileting Toileting steps completed by patient: Performs perineal  hygiene Toileting Assistive Devices: Grab bar or rail for support Toileting: 2: Max-Patient completed 1 of 3 steps  FIM - Radio producer Devices: Environmental consultant, Product manager Transfers: 5-To toilet/BSC: Supervision (verbal cues/safety issues), 5-From toilet/BSC: Supervision (verbal cues/safety issues)  FIM - Control and instrumentation engineer Devices: Arm rests Bed/Chair Transfer: 5: Bed > Chair or W/C: Supervision (verbal cues/safety issues), 4: Chair or W/C > Bed: Min A (steadying Pt. > 75%)  FIM - Locomotion: Wheelchair Distance: 15 Locomotion: Wheelchair: 1: Travels less than 50 ft with supervision, cueing or coaxing FIM - Locomotion: Ambulation Locomotion: Ambulation Assistive Devices: Other (comment) (None) Ambulation/Gait Assistance: 5: Supervision Locomotion: Ambulation: 1: Travels less than 50 ft with supervision/safety issues  Comprehension Comprehension Mode: Auditory Comprehension: 2-Understands basic 25 - 49% of the time/requires cueing 51 - 75% of the time  Expression Expression Mode: Nonverbal Expression: 1-Expresses basis less than 25% of the time/requires cueing greater than 75% of the time.  Social Interaction Social Interaction Mode: Asleep Social Interaction: 2-Interacts appropriately 25 - 49% of time - Needs frequent redirection.  Problem Solving Problem Solving Mode: Asleep Problem Solving: 2-Solves basic 25 - 49% of the time - needs direction more than half the time to initiate, plan or complete simple activities  Memory Memory Mode: Not assessed Memory: 1-Recognizes or recalls less than 25% of the time/requires cueing greater than 75% of the time  Medical Problem List and Plan: 1. 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  DVT Prophylaxis/Anticoagulation: Eliquis. Monitor for any bleeding episodes 3. Pain Management: Tylenol as needed   4.  Dysphagia.[name] Dysphagia 1 nectar liquids. Follow-up speech therapy 5. Neuropsych: This patient is not capable of making decisions on her own behalf.  -added low dose trazodone for sleep/restlessness 6. Skin/Wound Care: Routine skin checks 7. Fluids/Electrolytes/Nutrition: Strict I and O follow-up chemistries 8. Atrial fibrillation/hypertension. Presently on Eliquis and low-dose Lopressor. Follow-up  cardiology services. Cardiac rate control.(Patient on lisinopril 5 mg daily and hydrochlorothiazide 25 mg daily prior to admission and will resume as tolerated) 9. Mood/depression. Lexapro 5 mg daily. Provide emotional support 10. Hyperlipidemia. Lipitor 11. Diabetes mellitus with peripheral neuropathy. Hemoglobin A1c 6.0.   -sugars better with initiation of TF 12. Acute on chronic diastolic congestive heart failure. Monitor for any signs of fluid overload 13. Tobacco abuse. Counseling 14.  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 Will have PEG 1/29 and start tube feeds over the weekend. Spoke with nephrology and Fabrazyme will be scheduled at [facility] next week 16.  E coli pansensitive, Keflex for 7 d tx LOS (Days) 10 A FACE TO FACE EVALUATION WAS PERFORMED  KIRSTEINS,ANDREW E 04/17/2014, 8:09 AM

## 2014-04-17 NOTE — Consult Note (Signed)
Reason for Consult: open gastrostomy tube placement Referring Physician: Dr. 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  She was placed on Eliquis.  She has been on rehab since the 19th of January.  She has been eating dysphagia 1 nectar thick diet, however, her intake has been poor.  Interventional radiology attempted PEG tube placement, however, unsuccessful diet to patients anatomy.  We have therefore been asked to evaluate the patient for gastrostomy tube placement.  Her last dose of Eliquis was 1/26.  I was unable to obtain HPI.    Past Medical History  Diagnosis Date  . Hypertension   . 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 Expressive aphasia 03/23/2013  . Atrial fibrillation 05/01/2013  . Tobacco abuse 05/01/2013  . Diabetes mellitus without complication   . Depression   . Stroke     2015    Past Surgical History  Procedure Laterality Date  . Tee without cardioversion N/A 03/26/2013    Procedure: TRANSESOPHAGEAL ECHOCARDIOGRAM (TEE);  Surgeon: Sanda Klein, MD;  Location: [facility] ENDOSCOPY;  Service: Cardiovascular;  Laterality: N/A;  . Cesarean section      x3  . Hip replacement Right 1990's  . Hip replacemet Left 1990's    Family History  Problem Relation Age of Onset  . Alcohol abuse Maternal Aunt     Social History:  reports that she has never smoked. She does not have any smokeless tobacco history on file. She reports that she does not drink alcohol or use illicit drugs.  Allergies: No Known Allergies  Medications:  Scheduled Meds: . antiseptic oral rinse  7 mL Mouth Rinse q12n4p  . atorvastatin  40 mg Oral q1800  .  ceFAZolin (ANCEF) IV  2 g Intravenous On Call  . cephALEXin  250 mg Oral 3 times per day  . chlorhexidine  15 mL Mouth Rinse BID  . cloNIDine  0.1 mg  Transdermal Weekly  . escitalopram  5 mg Oral Daily  . insulin aspart  0-9 Units Subcutaneous 6 times per day  . loratadine  10 mg Oral Daily  . megestrol  400 mg Oral BID  . metoprolol tartrate  12.5 mg Per Tube BID  . multivitamin  5 mL Per Tube Daily   Continuous Infusions: . sodium chloride     PRN Meds:.acetaminophen, ondansetron **OR** ondansetron (ZOFRAN) IV, polyvinyl alcohol, RESOURCE THICKENUP CLEAR, senna-docusate, sorbitol, traZODone   Results for orders placed or performed during the hospital encounter of 04/07/14 (from the past 48 hour(s))  Glucose, capillary     Status: None   Collection Time: 04/15/14  4:31 PM  Result Value Ref Range   Glucose-Capillary 98 70 - 99 mg/dL  Glucose, capillary     Status: Abnormal   Collection Time: 04/15/14  8:28 PM  Result Value Ref Range   Glucose-Capillary 119 (H) 70 - 99 mg/dL   Comment 1 Notify RN   Glucose, capillary     Status: None   Collection Time: 04/16/14 12:03 AM  Result Value Ref Range   Glucose-Capillary 93 70 - 99 mg/dL   Comment 1 Notify RN   Glucose, capillary     Status: None   Collection Time: 04/16/14  4:02 AM  Result Value Ref Range   Glucose-Capillary 96 70 - 99 mg/dL   Comment 1 Notify RN  Glucose, capillary     Status: Abnormal   Collection Time: 04/16/14  8:57 AM  Result Value Ref Range   Glucose-Capillary 102 (H) 70 - 99 mg/dL   Comment 1 Notify RN   Glucose, capillary     Status: Abnormal   Collection Time: 04/16/14 11:58 AM  Result Value Ref Range   Glucose-Capillary 115 (H) 70 - 99 mg/dL   Comment 1 Notify RN   CBC     Status: Abnormal   Collection Time: 04/16/14 12:55 PM  Result Value Ref Range   WBC 3.7 (L) 4.0 - 10.5 K/uL   RBC 4.48 3.87 - 5.11 MIL/uL   Hemoglobin 12.3 12.0 - 15.0 g/dL   HCT 37.6 36.0 - 46.0 %   MCV 83.9 78.0 - 100.0 fL   MCH 27.5 26.0 - 34.0 pg   MCHC 32.7 30.0 - 36.0 g/dL   RDW 15.3 11.5 - 15.5 %   Platelets 254 150 - 400 K/uL  Protime-INR     Status: Abnormal    Collection Time: 04/16/14 12:55 PM  Result Value Ref Range   Prothrombin Time 16.0 (H) 11.6 - 15.2 seconds   INR 1.27 0.00 - 1.49  APTT     Status: None   Collection Time: 04/16/14 12:55 PM  Result Value Ref Range   aPTT 29 24 - 37 seconds  Basic metabolic panel     Status: Abnormal   Collection Time: 04/16/14 12:55 PM  Result Value Ref Range   Sodium 141 135 - 145 mmol/L   Potassium 4.3 3.5 - 5.1 mmol/L   Chloride 106 96 - 112 mmol/L   CO2 28 19 - 32 mmol/L   Glucose, Bld 120 (H) 70 - 99 mg/dL   BUN 30 (H) 6 - 23 mg/dL   Creatinine, Ser 1.09 0.50 - 1.10 mg/dL   Calcium 9.2 8.4 - 10.5 mg/dL   GFR calc non Af Amer 51 (L) >90 mL/min   GFR calc Af Amer 59 (L) >90 mL/min    Comment: (NOTE) The eGFR has been calculated using the CKD EPI equation. This calculation has not been validated in all clinical situations. eGFR's persistently <90 mL/min signify possible Chronic Kidney Disease.    Anion gap 7 5 - 15  Glucose, capillary     Status: Abnormal   Collection Time: 04/16/14  5:02 PM  Result Value Ref Range   Glucose-Capillary 100 (H) 70 - 99 mg/dL   Comment 1 Notify RN   Glucose, capillary     Status: None   Collection Time: 04/16/14  8:15 PM  Result Value Ref Range   Glucose-Capillary 98 70 - 99 mg/dL   Comment 1 Documented in Chart    Comment 2 Notify RN   Glucose, capillary     Status: Abnormal   Collection Time: 04/17/14 12:25 AM  Result Value Ref Range   Glucose-Capillary 106 (H) 70 - 99 mg/dL  Glucose, capillary     Status: None   Collection Time: 04/17/14  3:57 AM  Result Value Ref Range   Glucose-Capillary 80 70 - 99 mg/dL  Glucose, capillary     Status: None   Collection Time: 04/17/14  6:36 AM  Result Value Ref Range   Glucose-Capillary 88 70 - 99 mg/dL  Glucose, capillary     Status: None   Collection Time: 04/17/14 12:10 PM  Result Value Ref Range   Glucose-Capillary 84 70 - 99 mg/dL    Ct Abdomen Wo Contrast  04/15/2014  CLINICAL DATA:  Dysphagia,  evaluate for gastric tube placement  EXAM: CT ABDOMEN WITHOUT CONTRAST  TECHNIQUE: Multidetector CT imaging of the abdomen was performed following the standard protocol without IV contrast.  COMPARISON:  None.  FINDINGS: Lower chest:  Mild linear atelectasis in the medial left lower lobe.  Cardiomegaly.  Hepatobiliary: Liver is unremarkable.  Layering sludge in the gallbladder. No intrahepatic or extrahepatic ductal dilatation.  Pancreas: Pancreatic atrophy.  Spleen: Within normal limits.  Adrenals/Urinary Tract: Adrenal glands are unremarkable.  6 mm nonobstructing left renal calculus (coronal image 66). Right kidney is unremarkable. No hydronephrosis.  Stomach/Bowel: Stomach is unremarkable.  Portions of the transverse colon traverses anterior to the gastric body.  Visualized bowel is otherwise grossly unremarkable.  Vascular/Lymphatic: Atherosclerotic calcifications of the abdominal aorta.  No suspicious abdominal lymphadenopathy.  Other:  No abdominal ascites.  Musculoskeletal: Degenerative changes of the visualized thoracolumbar spine with dextroscoliosis.  IMPRESSION: 6 mm nonobstructing left renal calculus.  No hydronephrosis.  Portions of the transverse colon traverses anterior to the gastric body.  Otherwise unremarkable CT abdomen.   Electronically Signed   By: Julian Hy M.D.   On: 04/15/2014 17:26   Ir Fluoro Rm 30-60 Min  04/17/2014   CLINICAL DATA:  69 year old female with dysphagia. She has been referred for percutaneous gastrostomy tube placement  EXAM: IR FLOURO RM 0-60 MIN  COMPARISON:  CT of the abdomen 04/15/2013  MEDICATIONS: None  CONTRAST:  None  FLUOROSCOPY TIME:  Seconds & minutes  PROCEDURE: The procedure, risks, benefits, and alternatives were explained to the patient and to the patient's family. Questions regarding the procedure were encouraged and answered. The patient understands and consents to the procedure.  The patient was positioned in the supine position on the  fluoroscopy table. Fluoroscopic assistance was used to place an orogastric tube.  Once we confirmed position of the or gastric tube, the lumen of the stomach was inflated with air.  Anterior and lateral images were acquired once the stomach was inflated with air, confirming that the distal transverse colon and hepatic flexure were of located on the anterior surface of the stomach, as was demonstrated on the prior CT.  The procedure was terminated without placement, given the risk for transgression of the colon and related risk of bowel perforation.  Patient tolerated the procedure well and remained hemodynamically stable throughout.  No complications were encountered and no significant blood loss was encounter  COMPLICATIONS: None  FINDINGS: After placement of orogastric tube an inflation of the stomach, the distal transverse colon and splenic flexure were confirmed to overlie the stomach on both anterior and lateral views.  IMPRESSION: Status post attempted percutaneous gastrostomy tube. The procedure was not completed given that the distal transverse colon and splenic flexure were found to overlie the anterior surface of the stomach, as was seen on prior CT. No safe fluoroscopic window was present for attempted placement.  The details were discussed with the patient's son via telephone.  Signed,  Dulcy Fanny. Earleen Newport, DO  [facility]  [facility]  RECOMMENDATIONS: Evaluation for potential surgically placed gastrostomy or endoscopically placed gastrostomy may be considered as an alternative.   Electronically Signed   By: Corrie Mckusick D.O.   On: 04/17/2014 14:01    Review of Systems  Unable to perform ROS  Blood pressure 148/80, pulse 51, temperature 98.4 F (36.9 C), temperature source Oral, resp. rate 18, weight 134 lb 14.7 oz (61.2 kg), SpO2 100 %. Physical Exam  Constitutional:  She appears well-developed and well-nourished. No distress.  HENT:  Head:  Normocephalic and atraumatic.  Cardiovascular: Normal heart sounds and intact distal pulses.  Exam reveals no gallop and no friction rub.   No murmur heard. Respiratory: Effort normal and breath sounds normal. No respiratory distress. She has no wheezes. She has no rales. She exhibits no tenderness.  GI: Soft. Bowel sounds are normal. She exhibits no distension and no mass. There is no tenderness. There is no rebound and no guarding.  Pfannenstiel incision  Neurological: She is alert.  Non verbal.  Follows commands  Skin: Skin is warm and dry. She is not diaphoretic.    Assessment/Plan: CVA 03/2014 Diabetes mellitus dCHF Hypertension Atrial fibrillation  Dysphagia Poor oral intake  We will plan on laparoscopic possible open gastrostomy tube placement on Monday NPO after MN on Sunday I spoke with her son [name] in detail regarding risks and complications of surgery including but not limited to infection, bleeding, injury to surrounding organs.  He verbalizes understanding and wishes to proceed. Continue to hold Eliquis  Will see her PRN over the weekend Please contact [facility] with any questions or concerns  Philomina Leon ANP-BC 04/17/2014, 3:04 PM

## 2014-04-17 NOTE — Sedation Documentation (Signed)
MD looking at pictures to determine if able to proceed with GTUBE placement

## 2014-04-17 NOTE — Progress Notes (Signed)
Occupational Therapy Session Note  Patient Details  Name: Rebecca [name] MRN: [id number] Date of Birth: Dec 09, 1945  Today's Date: 04/17/2014 OT Individual Time: 1005-1100 OT Individual Time Calculation (min): 55 min    Short Term Goals::    Week 2:  OT Short Term Goal 1 (Week 2): STGs were not set as pt was to have a short LOS. Awaiting nursing home placement with a therapy goal of pt participating in session with less than 75% cues.  Skillepd Therapeutic Interventions/Progress Updates:    Pt seated at nursing station in w/c.  Pt holding communication sheets. When this clinician asked to look at her papers to point out the bathing picture to tell her it is time to bathe she snatched the papers away, closed her eyes and turned away.  Therapist checked her room for clean clothing, but there were no clothes available for her to wear today.  Pt taken to gym to see if pt would engage in a simple game. Pt presented with the RW to walk over to the game. Pt stood up from w/c with max coaxing and began to walk. Pt would only take minimal direction on where to walk. She did end up walking for over 45 minutes at a very slow pace up and down the hallways. Pt would not respond if asked if she was tired or needed to sit, even with the pictures on the communication sheets. Pt eventually led herself back to the gym. Attempted to have pt engage in UE stretching with dowel bar. Total A to move arms and pt would not continue the movement on her own. Pt is not actively engaged in therapy or even seem to understand the purpose of therapy and her rehab stay.  Quick release belt applied to chair and pt taken back to nursing station.  Therapy Documentation Precautions:  Precautions Precautions: Fall Precaution Comments: pt is unstready on her feet. pt apparently knows how to turn off bed alarm Restrictions Weight Bearing Restrictions: No     Pain: Pain Assessment Pain Assessment: Faces Faces Pain Scale: No  hurt ADL: ADL ADL Comments: Refer to FIM  See FIM for current functional status  Therapy/Group: Individual Therapy  SAGUIER,JULIA 04/17/2014, 11:27 AM

## 2014-04-17 NOTE — Progress Notes (Signed)
ANTICOAGULATION CONSULT NOTE - Initial Consult  Pharmacy Consult for Heparin Indication: atrial fibrillation  No Known Allergies  Patient Measurements: Weight: 134 lb 14.7 oz (61.2 kg)  IBW 54.7 kg Heparin Dosing Weight:61.2 kg  Vital Signs: Temp: 98.4 F (36.9 C) (01/29 1434) Temp Source: Oral (01/29 1434) BP: 148/80 mmHg (01/29 1434) Pulse Rate: 51 (01/29 1434)  Labs:  Recent Labs  04/16/14 1255  HGB 12.3  HCT 37.6  PLT 254  APTT 29  LABPROT 16.0*  INR 1.27  CREATININE 1.09    Estimated Creatinine Clearance: 42.1 mL/min (by C-G formula based on Cr of 1.09).   Medical History: Past Medical History  Diagnosis Date  . Hypertension   . 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 Expressive aphasia 03/23/2013  . Atrial fibrillation 05/01/2013  . Tobacco abuse 05/01/2013  . Diabetes mellitus without complication   . Depression   . Stroke     2015    Assessment: 30 YOF on xarelto PTA for afib, originally admitted 1/13 with stroke. Neurology changed xarelto to Eliquis. Transferred to CIR on 1/19. Limited PO intake.  Anticoag: Eliquis on hold for Gtube placement which was supposed to be today 1/29. Failed Gtube placement due to anatomy as documented by Dr. Loreta Ave with IR. Request made for general surgery participation evaluation for placement of gastrostomy tube: We will plan on laparoscopic possible open gastrostomy tube placement on Monday. Rehab PA ok with recommendations to start IV heparin to cover for afib over the weekend with CHADSVASC score 6. No Eliquis since 1/26. CBC WNL.  Goal of Therapy:  Heparin level 0.3-0.7 units/ml Monitor platelets by anticoagulation protocol: Yes   Plan:  Heparin 3000 unit IV bolus Heparin infusion at 800 units/hr Check heparin level in 6-8 hrs and daily.  Maanav Kassabian S. Merilynn Finland, PharmD, BCPS Clinical Staff Pharmacist Pager [phone]  Misty Stanley Stillinger 04/17/2014,4:33 PM

## 2014-04-18 ENCOUNTER — Inpatient Hospital Stay (HOSPITAL_COMMUNITY): Payer: Self-pay | Admitting: *Deleted

## 2014-04-18 LAB — APTT
APTT: 88 s — AB (ref 24–37)
aPTT: 29 seconds (ref 24–37)
aPTT: 163 seconds — ABNORMAL HIGH (ref 24–37)
aPTT: 99 seconds — ABNORMAL HIGH (ref 24–37)

## 2014-04-18 LAB — CBC
HCT: 36.4 % (ref 36.0–46.0)
HEMOGLOBIN: 11.9 g/dL — AB (ref 12.0–15.0)
MCH: 27.3 pg (ref 26.0–34.0)
MCHC: 32.7 g/dL (ref 30.0–36.0)
MCV: 83.5 fL (ref 78.0–100.0)
PLATELETS: 234 10*3/uL (ref 150–400)
RBC: 4.36 MIL/uL (ref 3.87–5.11)
RDW: 14.9 % (ref 11.5–15.5)
WBC: 3.2 10*3/uL — ABNORMAL LOW (ref 4.0–10.5)

## 2014-04-18 LAB — HEPARIN LEVEL (UNFRACTIONATED)
HEPARIN UNFRACTIONATED: 0.94 [IU]/mL — AB (ref 0.30–0.70)
HEPARIN UNFRACTIONATED: 0.56 [IU]/mL (ref 0.30–0.70)
Heparin Unfractionated: 1.47 IU/mL — ABNORMAL HIGH (ref 0.30–0.70)

## 2014-04-18 LAB — GLUCOSE, CAPILLARY
GLUCOSE-CAPILLARY: 103 mg/dL — AB (ref 70–99)
GLUCOSE-CAPILLARY: 112 mg/dL — AB (ref 70–99)
Glucose-Capillary: 130 mg/dL — ABNORMAL HIGH (ref 70–99)
Glucose-Capillary: 74 mg/dL (ref 70–99)
Glucose-Capillary: 81 mg/dL (ref 70–99)
Glucose-Capillary: 89 mg/dL (ref 70–99)

## 2014-04-18 MED ORDER — HEPARIN (PORCINE) IN NACL 100-0.45 UNIT/ML-% IJ SOLN
850.0000 [IU]/h | INTRAMUSCULAR | Status: DC
  Filled 2014-04-18: qty 250

## 2014-04-18 NOTE — Progress Notes (Signed)
Patient pulled her IV this morning, she is getting heparin and IV fluids. New IV site to left hand placed by IV team. Bilateral mittens applied per MD order. The son is made aware and verbalized understanding.

## 2014-04-18 NOTE — Progress Notes (Signed)
Rebecca [name] is a 69 y.o. female 1945-10-11 [id number]  Subjective:  Difficulty with PEG placement 1/29 reviewed - for open G tube 2/1. Pt does not indicate discomfort. No family at side. Mittens placed to protect PIV and NGT  Objective: Vital signs in last 24 hours: Temp:  [98.4 F (36.9 C)-98.5 F (36.9 C)] 98.5 F (36.9 C) (01/30 0522) Pulse Rate:  [51-56] 52 (01/30 0522) Resp:  [17-18] 17 (01/30 0522) BP: (136-148)/(79-93) 136/79 mmHg (01/30 0522) SpO2:  [100 %] 100 % (01/30 0522) Weight change:  Last BM Date: 04/15/14  Intake/Output from previous day: 01/29 0701 - 01/30 0700 In: 788 [P.O.:100; I.V.:688] Out: -   Physical Exam General: No apparent distress   Looks at examiner but severe aphasia Lungs: Normal effort. Lungs clear to auscultation anterior, no crackles or wheezes. Cardiovascular: Regular rate and rhythm, no edema Neurological: No new neurological deficits Wounds: N/A    Lab Results: BMET    Component Value Date/Time   NA 141 04/16/2014 1255   K 4.3 04/16/2014 1255   CL 106 04/16/2014 1255   CO2 28 04/16/2014 1255   GLUCOSE 120* 04/16/2014 1255   BUN 30* 04/16/2014 1255   CREATININE 1.09 04/16/2014 1255   CALCIUM 9.2 04/16/2014 1255   GFRNONAA 51* 04/16/2014 1255   GFRAA 59* 04/16/2014 1255   CBC    Component Value Date/Time   WBC 3.2* 04/18/2014 0030   RBC 4.36 04/18/2014 0030   HGB 11.9* 04/18/2014 0030   HCT 36.4 04/18/2014 0030   PLT 234 04/18/2014 0030   MCV 83.5 04/18/2014 0030   MCH 27.3 04/18/2014 0030   MCHC 32.7 04/18/2014 0030   RDW 14.9 04/18/2014 0030   LYMPHSABS 1.3 04/08/2014 0730   MONOABS 0.6 04/08/2014 0730   EOSABS 0.0 04/08/2014 0730   BASOSABS 0.0 04/08/2014 0730   CBG's (last 3):   Recent Labs  04/18/14 0007 04/18/14 0402 04/18/14 0801  GLUCAP 74 89 81   LFT's Lab Results  Component Value Date   ALT 19 04/08/2014   AST 35 04/08/2014   ALKPHOS 37* 04/08/2014   BILITOT 0.9 04/08/2014     Studies/Results: Ir Fluoro Rm 30-60 Min  04/17/2014   CLINICAL DATA:  69 year old female with dysphagia. She has been referred for percutaneous gastrostomy tube placement  EXAM: IR FLOURO RM 0-60 MIN  COMPARISON:  CT of the abdomen 04/15/2013  MEDICATIONS: None  CONTRAST:  None  FLUOROSCOPY TIME:  Seconds & minutes  PROCEDURE: The procedure, risks, benefits, and alternatives were explained to the patient and to the patient's family. Questions regarding the procedure were encouraged and answered. The patient understands and consents to the procedure.  The patient was positioned in the supine position on the fluoroscopy table. Fluoroscopic assistance was used to place an orogastric tube.  Once we confirmed position of the or gastric tube, the lumen of the stomach was inflated with air.  Anterior and lateral images were acquired once the stomach was inflated with air, confirming that the distal transverse colon and hepatic flexure were of located on the anterior surface of the stomach, as was demonstrated on the prior CT.  The procedure was terminated without placement, given the risk for transgression of the colon and related risk of bowel perforation.  Patient tolerated the procedure well and remained hemodynamically stable throughout.  No complications were encountered and no significant blood loss was encounter  COMPLICATIONS: None  FINDINGS: After placement of orogastric tube an inflation of the stomach, the distal  transverse colon and splenic flexure were confirmed to overlie the stomach on both anterior and lateral views.  IMPRESSION: Status post attempted percutaneous gastrostomy tube. The procedure was not completed given that the distal transverse colon and splenic flexure were found to overlie the anterior surface of the stomach, as was seen on prior CT. No safe fluoroscopic window was present for attempted placement.  The details were discussed with the patient's son via telephone.  Signed,  Yvone Neu. Loreta Ave, DO  [facility]  [facility]  RECOMMENDATIONS: Evaluation for potential surgically placed gastrostomy or endoscopically placed gastrostomy may be considered as an alternative.   Electronically Signed   By: Gilmer Mor D.O.   On: 04/17/2014 14:01    Medications:  I have reviewed the patient's current medications. Scheduled Medications: . antiseptic oral rinse  7 mL Mouth Rinse q12n4p  . atorvastatin  40 mg Oral q1800  . cephALEXin  250 mg Oral 3 times per day  . chlorhexidine  15 mL Mouth Rinse BID  . cloNIDine  0.1 mg Transdermal Weekly  . escitalopram  5 mg Oral Daily  . insulin aspart  0-9 Units Subcutaneous 6 times per day  . loratadine  10 mg Oral Daily  . megestrol  400 mg Oral BID  . metoprolol tartrate  12.5 mg Per Tube BID  . multivitamin  5 mL Per Tube Daily   PRN Medications: acetaminophen, ondansetron **OR** ondansetron (ZOFRAN) IV, polyvinyl alcohol, RESOURCE THICKENUP CLEAR, senna-docusate, sorbitol, traZODone  Assessment/Plan: Principal Problem:   Cardioembolic stroke Active Problems:   Fabry disease   Atrial fibrillation   Global aphasia   Apraxia following CVA (cerebrovascular accident)  1. 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 DVT Prophylaxis/Anticoagulation: Eliquis. Monitor for any bleeding episodes 3. Pain Management: Tylenol as needed  4. Dysphagia.[name] Dysphagia 1 nectar liquids. Follow-up speech therapy 5. Neuropsych: This patient is not capable of making decisions on her own behalf. -continue low dose trazodone for sleep/restlessness 6. Skin/Wound Care: Routine skin checks 7. Fluids/Electrolytes/Nutrition: Strict I and O follow-up chemistries 8. Atrial fibrillation/hypertension. Presently on Eliquis and low-dose Lopressor. Follow-up cardiology services. Cardiac rate control. Patient on lisinopril 5 mg daily and  hydrochlorothiazide 25 mg daily prior to admission and will resume as tolerated 9. Mood/depression. Lexapro 5 mg daily. Provide emotional support 10. Hyperlipidemia. Lipitor 11. Diabetes mellitus with peripheral neuropathy. Hemoglobin A1c 6.0.  -sugars better with initiation of TF 12. Acute on chronic diastolic congestive heart failure. Monitor for any signs of fluid overload 13. Tobacco abuse. Counseling 14. 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 Planning open G tube placement 2/1 as unable to place IR PEG 1/29. Team previously spoke with nephrology and Fabrazyme will be scheduled at [facility] week of 2/1 16. E coli UTI -pansensitive, Keflex for 7 d tx  Length of stay, days: 11   Mina Carlisi A. Felicity Coyer, MD 04/18/2014, 9:18 AM

## 2014-04-18 NOTE — Progress Notes (Signed)
ANTICOAGULATION CONSULT NOTE - Follow Up Consult  Pharmacy Consult for Heparin Indication: atrial fibrillation, recent CVA  No Known Allergies  Patient Measurements: Weight: 134 lb 14.7 oz (61.2 kg)  Vital Signs: Temp: 98.5 F (36.9 C) (01/30 0522) Temp Source: Oral (01/30 0522) BP: 136/79 mmHg (01/30 0522) Pulse Rate: 52 (01/30 0522)  Labs:  Recent Labs  04/16/14 1255 04/18/14 0030 04/18/14 1147  HGB 12.3 11.9*  --   HCT 37.6 36.4  --   PLT 254 234  --   APTT 29 29 88*  LABPROT 16.0*  --   --   INR 1.27  --   --   HEPARINUNFRC  --  0.56 0.94*  CREATININE 1.09  --   --     Estimated Creatinine Clearance: 42.1 mL/min (by C-G formula based on Cr of 1.09).   Medications:  Heparin @ 1000 units/hr  Assessment: 68 YOF on Eliquis for hx Afib and recent CVA however this was held for G-tube placement that has now been rescheduled for 2/1. Heparin bridge was started on 1/29. Pharmacy is monitoring both aPTT and heparin levels since Eliquis is still falsely elevating the heparin levels.  An aPTT was therapeutic this morning (aPTT 88, goal of 66-85 secs), and heparin level remains falsely elevated. The RN reports no bleeding issues however the drip was interrupted when the patient pulled out the IV around 0730. Per RN report - it was resumed around 0930. The lab was supposed to be drawn around 1000 however lab had to be contacted and had issues finding the sample. It is unclear how to interpret the results in the setting of the drip being off and a potential false sample from lab. Will reduce the drip rate slightly and recheck a 6hr heparin level.   Goal of Therapy:  Heparin level 0.3-0.5 units/ml aPTT 66-85 seconds (lower goal in setting of recent CVA) Monitor platelets by anticoagulation protocol: Yes   Plan:  1. Reduce heparin drip rate slightly to 900 units/hr (9 ml/hr) 2. Will continue to monitor for any signs/symptoms of bleeding and will follow up with heparin level and  aPTT in 6 hours   Georgina Pillion, PharmD, BCPS Clinical Pharmacist Pager: [phone] 04/18/2014 1:54 PM

## 2014-04-18 NOTE — Progress Notes (Signed)
ANTICOAGULATION CONSULT NOTE - Follow Up Consult  Pharmacy Consult for heparin Indication: atrial fibrillation  Labs:  Recent Labs  04/16/14 1255 04/18/14 0030  HGB 12.3 11.9*  HCT 37.6 36.4  PLT 254 234  APTT 29 29  LABPROT 16.0*  --   INR 1.27  --   HEPARINUNFRC  --  0.56  CREATININE 1.09  --     Assessment: 69yo female subtherapeutic on heparin with initial dosing for Afib transition off Eliquis (level within goal but being affected by Eliquis, PTT remains at baseline).  Goal of Therapy:  aPTT 66-102 seconds   Plan:  Will increase heparin gtt by 3 units/kg/hr to 1000 units/hr and check PTT in 8hr.  Vernard Gambles, PharmD, BCPS  04/18/2014,1:46 AM

## 2014-04-18 NOTE — Progress Notes (Signed)
Physical Therapy Note  Patient Details  Name: Rebecca [name] MRN: [id number] Date of Birth: 21-Aug-1945 Today's Date: 04/18/2014  Missed   Pt refused skilled PT tx this morning. Discussed with RN who reported decreased levels of engagement despite encouragement. Pt has repeatedly removed IV and is now in mitts for safety. Pt physically resistant and jerking away when therapist attempts physical assist for mobility. Pt educated on importance of OOB mobility and will reattempt later today as able.    Clydene Laming, PT, DPT   04/18/2014, 1:14 PM

## 2014-04-18 NOTE — Progress Notes (Signed)
ANTICOAGULATION CONSULT NOTE - Follow Up Consult  Pharmacy Consult for Heparin Indication: atrial fibrillation, recent CVA  No Known Allergies  Patient Measurements: Weight: 134 lb 14.7 oz (61.2 kg)  Vital Signs: Temp: 98.4 F (36.9 C) (01/30 1557) Temp Source: Oral (01/30 1557) BP: 94/70 mmHg (01/30 1933) Pulse Rate: 78 (01/30 1933)  Labs:  Recent Labs  04/16/14 1255 04/18/14 0030 04/18/14 1147 04/18/14 1940  HGB 12.3 11.9*  --   --   HCT 37.6 36.4  --   --   PLT 254 234  --   --   APTT 29 29 88* 163*  LABPROT 16.0*  --   --   --   INR 1.27  --   --   --   HEPARINUNFRC  --  0.56 0.94* 1.47*  CREATININE 1.09  --   --   --     Estimated Creatinine Clearance: 42.1 mL/min (by C-G formula based on Cr of 1.09).   Medications:  Heparin @ 1000 units/hr  Assessment: 68 YOF on Eliquis for hx Afib and recent CVA however this was held for G-tube placement that has now been rescheduled for 2/1. Heparin bridge was started on 1/29. Pharmacy is monitoring both aPTT and heparin levels since Eliquis is still falsely elevating the heparin levels.  An aPTT was therapeutic this morning (aPTT 88, goal of 66-85 secs), and heparin level remains falsely elevated. The RN reports no bleeding issues however the drip was interrupted when the patient pulled out the IV around 0730. Per RN report - it was resumed around 0930. The lab was supposed to be drawn around 1000 however lab had to be contacted and had issues finding the sample. It is unclear how to interpret the results in the setting of the drip being off and a potential false sample from lab. Will reduce the drip rate slightly and recheck a 6hr heparin level.   Follow-up aPTT is supratherapeutic at 163. Question validity of level and rechecking a STAT aPTT, while turning off drip just in case. STAT aPTT came back still supratherapeutic at 99. Will hold heparin for 1 hour total and reduce dose as level continues to rise from this morning.  Nurse reports no bleeding.  Goal of Therapy:  Heparin level 0.3-0.5 units/ml aPTT 66-85 seconds (lower goal in setting of recent CVA) Monitor platelets by anticoagulation protocol: Yes   Plan:  1. Reduce heparin drip rate slightly to 850 units/hr 2. Will continue to monitor for any signs/symptoms of bleeding and will follow up with heparin level and aPTT in 6 hours   Arlean Hopping. Newman Pies, PharmD Clinical Pharmacist Pager [phone] 04/18/2014 8:33 PM

## 2014-04-19 ENCOUNTER — Inpatient Hospital Stay (HOSPITAL_COMMUNITY): Payer: Self-pay

## 2014-04-19 LAB — CBC
HCT: 32.9 % — ABNORMAL LOW (ref 36.0–46.0)
HEMOGLOBIN: 10.9 g/dL — AB (ref 12.0–15.0)
MCH: 26.8 pg (ref 26.0–34.0)
MCHC: 33.1 g/dL (ref 30.0–36.0)
MCV: 81 fL (ref 78.0–100.0)
PLATELETS: 237 10*3/uL (ref 150–400)
RBC: 4.06 MIL/uL (ref 3.87–5.11)
RDW: 14.9 % (ref 11.5–15.5)
WBC: 3.1 10*3/uL — ABNORMAL LOW (ref 4.0–10.5)

## 2014-04-19 LAB — HEPARIN LEVEL (UNFRACTIONATED)
Heparin Unfractionated: 1.4 IU/mL — ABNORMAL HIGH (ref 0.30–0.70)
Heparin Unfractionated: 0.88 [IU]/mL — ABNORMAL HIGH (ref 0.30–0.70)

## 2014-04-19 LAB — APTT
APTT: 158 s — AB (ref 24–37)
APTT: 105 s — AB (ref 24–37)

## 2014-04-19 LAB — GLUCOSE, CAPILLARY
Glucose-Capillary: 105 mg/dL — ABNORMAL HIGH (ref 70–99)
Glucose-Capillary: 120 mg/dL — ABNORMAL HIGH (ref 70–99)
Glucose-Capillary: 89 mg/dL (ref 70–99)
Glucose-Capillary: 91 mg/dL (ref 70–99)
Glucose-Capillary: 91 mg/dL (ref 70–99)
Glucose-Capillary: 93 mg/dL (ref 70–99)

## 2014-04-19 MED ORDER — HEPARIN (PORCINE) IN NACL 100-0.45 UNIT/ML-% IJ SOLN
500.0000 [IU]/h | INTRAMUSCULAR | Status: DC
  Filled 2014-04-19: qty 250

## 2014-04-19 MED ORDER — HEPARIN (PORCINE) IN NACL 100-0.45 UNIT/ML-% IJ SOLN
600.0000 [IU]/h | INTRAMUSCULAR | Status: DC
  Filled 2014-04-19: qty 250

## 2014-04-19 NOTE — Progress Notes (Signed)
Rebecca [name] is a 69 y.o. female March 06, 1946 [id number]  Subjective:  Difficulty with PEG placement 1/29 reviewed - for open G tube 2/1.  Pt does not indicate discomfort. No family at side. Mittens placed 1/30 to protect PIV and NGT  Objective: Vital signs in last 24 hours: Temp:  [98.4 F (36.9 C)] 98.4 F (36.9 C) (01/31 0502) Pulse Rate:  [54-78] 54 (01/31 0502) Resp:  [16-17] 17 (01/31 0502) BP: (94-121)/(70-72) 121/72 mmHg (01/31 0502) SpO2:  [98 %-99 %] 98 % (01/31 0502) Weight change:  Last BM Date: 04/15/14  Intake/Output from previous day: 01/30 0701 - 01/31 0700 In: 120 [P.O.:120] Out: -   Physical Exam General: No apparent distress   Looks at examiner but severe aphasia Lungs: Normal effort. Lungs clear to auscultation anterior, no crackles or wheezes. Cardiovascular: Regular rate and rhythm, no edema Neurological: No new neurological deficits Wounds: N/A    Lab Results: BMET    Component Value Date/Time   NA 141 04/16/2014 1255   K 4.3 04/16/2014 1255   CL 106 04/16/2014 1255   CO2 28 04/16/2014 1255   GLUCOSE 120* 04/16/2014 1255   BUN 30* 04/16/2014 1255   CREATININE 1.09 04/16/2014 1255   CALCIUM 9.2 04/16/2014 1255   GFRNONAA 51* 04/16/2014 1255   GFRAA 59* 04/16/2014 1255   CBC    Component Value Date/Time   WBC 3.1* 04/19/2014 0515   RBC 4.06 04/19/2014 0515   HGB 10.9* 04/19/2014 0515   HCT 32.9* 04/19/2014 0515   PLT 237 04/19/2014 0515   MCV 81.0 04/19/2014 0515   MCH 26.8 04/19/2014 0515   MCHC 33.1 04/19/2014 0515   RDW 14.9 04/19/2014 0515   LYMPHSABS 1.3 04/08/2014 0730   MONOABS 0.6 04/08/2014 0730   EOSABS 0.0 04/08/2014 0730   BASOSABS 0.0 04/08/2014 0730   CBG's (last 3):    Recent Labs  04/19/14 0006 04/19/14 0441 04/19/14 0809  GLUCAP 91 91 105*   LFT's Lab Results  Component Value Date   ALT 19 04/08/2014   AST 35 04/08/2014   ALKPHOS 37* 04/08/2014   BILITOT 0.9 04/08/2014    Studies/Results: Ir  Fluoro Rm 30-60 Min  04/17/2014   CLINICAL DATA:  69 year old female with dysphagia. She has been referred for percutaneous gastrostomy tube placement  EXAM: IR FLOURO RM 0-60 MIN  COMPARISON:  CT of the abdomen 04/15/2013  MEDICATIONS: None  CONTRAST:  None  FLUOROSCOPY TIME:  Seconds & minutes  PROCEDURE: The procedure, risks, benefits, and alternatives were explained to the patient and to the patient's family. Questions regarding the procedure were encouraged and answered. The patient understands and consents to the procedure.  The patient was positioned in the supine position on the fluoroscopy table. Fluoroscopic assistance was used to place an orogastric tube.  Once we confirmed position of the or gastric tube, the lumen of the stomach was inflated with air.  Anterior and lateral images were acquired once the stomach was inflated with air, confirming that the distal transverse colon and hepatic flexure were of located on the anterior surface of the stomach, as was demonstrated on the prior CT.  The procedure was terminated without placement, given the risk for transgression of the colon and related risk of bowel perforation.  Patient tolerated the procedure well and remained hemodynamically stable throughout.  No complications were encountered and no significant blood loss was encounter  COMPLICATIONS: None  FINDINGS: After placement of orogastric tube an inflation of the stomach, the distal  transverse colon and splenic flexure were confirmed to overlie the stomach on both anterior and lateral views.  IMPRESSION: Status post attempted percutaneous gastrostomy tube. The procedure was not completed given that the distal transverse colon and splenic flexure were found to overlie the anterior surface of the stomach, as was seen on prior CT. No safe fluoroscopic window was present for attempted placement.  The details were discussed with the patient's son via telephone.  Signed,  Yvone Neu. Loreta Ave, DO  [facility]  [facility]  RECOMMENDATIONS: Evaluation for potential surgically placed gastrostomy or endoscopically placed gastrostomy may be considered as an alternative.   Electronically Signed   By: Gilmer Mor D.O.   On: 04/17/2014 14:01    Medications:  I have reviewed the patient's current medications. Scheduled Medications: . antiseptic oral rinse  7 mL Mouth Rinse q12n4p  . atorvastatin  40 mg Oral q1800  . cephALEXin  250 mg Oral 3 times per day  . chlorhexidine  15 mL Mouth Rinse BID  . cloNIDine  0.1 mg Transdermal Weekly  . escitalopram  5 mg Oral Daily  . insulin aspart  0-9 Units Subcutaneous 6 times per day  . loratadine  10 mg Oral Daily  . megestrol  400 mg Oral BID  . metoprolol tartrate  12.5 mg Per Tube BID  . multivitamin  5 mL Per Tube Daily   PRN Medications: acetaminophen, ondansetron **OR** ondansetron (ZOFRAN) IV, polyvinyl alcohol, RESOURCE THICKENUP CLEAR, senna-docusate, sorbitol, traZODone  Assessment/Plan: Principal Problem:   Cardioembolic stroke Active Problems:   Fabry disease   Atrial fibrillation   Global aphasia   Apraxia following CVA (cerebrovascular accident)  1. 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 DVT Prophylaxis/Anticoagulation: Eliquis. Monitor for any bleeding episodes 3. Pain Management: Tylenol as needed  4. Dysphagia.[name] Dysphagia 1 nectar liquids. Follow-up speech therapy 5. Neuropsych: This patient is not capable of making decisions on her own behalf. -continue low dose trazodone for sleep/restlessness 6. Skin/Wound Care: Routine skin checks 7. Fluids/Electrolytes/Nutrition: Strict I and O follow-up chemistries 8. Atrial fibrillation/hypertension. Presently on Eliquis and low-dose Lopressor. Follow-up cardiology services. Cardiac rate control. Patient on lisinopril 5 mg daily and hydrochlorothiazide 25 mg daily  prior to admission and will resume as tolerated 9. Mood/depression. Lexapro 5 mg daily. Provide emotional support 10. Hyperlipidemia. Lipitor 11. Diabetes mellitus with peripheral neuropathy. Hemoglobin A1c 6.0.  -sugars better with initiation of TF 12. Acute on chronic diastolic congestive heart failure. Monitor for any signs of fluid overload 13. Tobacco abuse. Counseling 14. 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 Planning open G tube placement 2/1 as unable to place IR PEG 1/29. Team previously spoke with nephrology and Fabrazyme will be scheduled at [facility] week of 2/1 16. E coli UTI -pansensitive, Keflex for 7 d tx  Length of stay, days: 12   Holley Wirt A. Felicity Coyer, MD 04/19/2014, 10:20 AM

## 2014-04-19 NOTE — Progress Notes (Signed)
ANTICOAGULATION CONSULT NOTE - Follow Up Consult  Pharmacy Consult for Heparin Indication: atrial fibrillation, recent CVA  No Known Allergies  Patient Measurements: Weight: 134 lb 14.7 oz (61.2 kg)  Vital Signs: Temp: 98.4 F (36.9 C) (01/31 0502) Temp Source: Oral (01/31 0502) BP: 121/72 mmHg (01/31 0502) Pulse Rate: 54 (01/31 0502)  Labs:  Recent Labs  04/16/14 1255  04/18/14 0030 04/18/14 1147 04/18/14 1940 04/18/14 2104 04/19/14 0515  HGB 12.3  --  11.9*  --   --   --  10.9*  HCT 37.6  --  36.4  --   --   --  32.9*  PLT 254  --  234  --   --   --  237  APTT 29  --  29 88* 163* 99* 158*  LABPROT 16.0*  --   --   --   --   --   --   INR 1.27  --   --   --   --   --   --   HEPARINUNFRC  --   < > 0.56 0.94* 1.47*  --  1.40*  CREATININE 1.09  --   --   --   --   --   --   < > = values in this interval not displayed.  Estimated Creatinine Clearance: 42.1 mL/min (by C-G formula based on Cr of 1.09).   Medications:  Heparin @ 850 units/hr  Assessment: 68 YOF on Eliquis for hx Afib and recent CVA however this was held for G-tube placement that has now been rescheduled for 2/1. Heparin bridge was started on 1/29. Pharmacy is monitoring both aPTT and heparin levels since Eliquis is still falsely elevating the heparin levels.  An aPTT was SUPRAtherapeutic this morning (aPTT 158, goal of 66-85 secs), and heparin level is also elevated (HL 1.4, goal of 0.3-0.5). Went into patient room this AM and verified that the drip and rate were running correctly. Also verified that they heparin level was drawn from the right arm (heparin is infusing in the left arm). The RN reports no bleeding issues at this time. The RN was instructed to turn the heparin off - will plan to hold for 1 hour and resume at a lower rate.   Goal of Therapy:  Heparin level 0.3-0.5 units/ml aPTT 66-85 seconds (lower goal in setting of recent CVA) Monitor platelets by anticoagulation protocol: Yes   Plan:   1. Reduce heparin drip rate slightly to 600 units/hr (6 ml/hr) 2. Will continue to monitor for any signs/symptoms of bleeding and will follow up with heparin level and aPTT in 6 hours   Georgina Pillion, PharmD, BCPS Clinical Pharmacist Pager: [phone] 04/19/2014 7:25 AM

## 2014-04-19 NOTE — Progress Notes (Signed)
Patient showed much less agitation tonight, she has for the past 2 nights showed every sign of total understanding of what is happening and has had no trouble with nonverbal communication leaving no doubt that she has wanted no part of any care given. As the night has progressed patients mood has lightened. She has smiled often and been much more corporative with her own care. Early this evening patient was able to verbally answer several questions. Short yes no answers but this seems to have completely changed her mood. She willingly took medication and in no way fought when blood was being taken. Patient still has mittens on hands to prevent her from pulling out IV and Heparin drip still running at 8.5 now. Will continue to monitor patients throughout night. Cindee Salt, RN

## 2014-04-19 NOTE — Progress Notes (Signed)
ANTICOAGULATION CONSULT NOTE - Follow Up Consult  Pharmacy Consult for Heparin Indication: atrial fibrillation, recent CVA  No Known Allergies  Patient Measurements: Weight: 134 lb 14.7 oz (61.2 kg)  Vital Signs: Temp: 99.5 F (37.5 C) (01/31 1509) Temp Source: Oral (01/31 1509) BP: 100/63 mmHg (01/31 1509) Pulse Rate: 77 (01/31 1509)  Labs:  Recent Labs  04/18/14 0030  04/18/14 1940 04/18/14 2104 04/19/14 0515 04/19/14 1710  HGB 11.9*  --   --   --  10.9*  --   HCT 36.4  --   --   --  32.9*  --   PLT 234  --   --   --  237  --   APTT 29  < > 163* 99* 158* 105*  HEPARINUNFRC 0.56  < > 1.47*  --  1.40* 0.88*  < > = values in this interval not displayed.  Estimated Creatinine Clearance: 42.1 mL/min (by C-G formula based on Cr of 1.09).   Medications:  Heparin @ 600 units/hr  Assessment: 68 YOF on Eliquis for hx Afib and recent CVA however this was held for G-tube placement that has now been rescheduled for 2/1. Heparin bridge was started on 1/29. Pharmacy is monitoring both aPTT and heparin levels since Eliquis is still falsely elevating the heparin levels.  An aPTT was SUPRAtherapeutic this morning (aPTT 158, goal of 66-85 secs), and heparin level is also elevated (HL 1.4, goal of 0.3-0.5). Went into patient room this AM and verified that the drip and rate were running correctly. Also verified that they heparin level was drawn from the right arm (heparin is infusing in the left arm). The RN reports no bleeding issues at this time. The RN was instructed to turn the heparin off - will plan to hold for 1 hour and resume at a lower rate.   A repeat aPTT was supratherapeutic at 105, and heparin level at 0.88. The nurse reports no bleeding. Will turn off heparin for 1 hour and then reduce dose.  Goal of Therapy:  Heparin level 0.3-0.5 units/ml aPTT 66-85 seconds (lower goal in setting of recent CVA) Monitor platelets by anticoagulation protocol: Yes   Plan:  1. Reduce  heparin drip rate slightly to 450 units/hr (6 ml/hr) 2. Will continue to monitor for any signs/symptoms of bleeding and will follow up with heparin level and aPTT in 6 hours   Arlean Hopping. Newman Pies, PharmD Clinical Pharmacist Pager [phone]  04/19/2014 6:10 PM

## 2014-04-19 NOTE — Progress Notes (Signed)
Physical Therapy Note  Patient Details  Name: Rebecca [name] MRN: [id number] Date of Birth: [birth year] Today's Date: 04/19/2014    Pt supine in bed upon therapist's arrival. Pt non-responsive to therapist, frequently closing her eyes, covering her eyes with her hands, and turning away from therapist. Attempted to engage pt in conversation with gestures and communication board, but pt continued to refuse to respond. Pt refused to participate in therapy despite max encouragement. Pt left supine in bed w/ bed alarm on. Pt missed 30 minutes scheduled PT. Will follow up per POC.  Hosie Spangle 04/19/2014, 4:31 PM

## 2014-04-20 ENCOUNTER — Encounter (HOSPITAL_COMMUNITY)
Admission: RE | Disposition: A | Discharge status: Skilled Nursing Facility | Payer: Self-pay | Source: Intra-hospital | Attending: Physical Medicine & Rehabilitation

## 2014-04-20 ENCOUNTER — Inpatient Hospital Stay (HOSPITAL_COMMUNITY): Payer: Medicare Other | Admitting: Anesthesiology

## 2014-04-20 ENCOUNTER — Inpatient Hospital Stay (HOSPITAL_COMMUNITY): Payer: Self-pay

## 2014-04-20 ENCOUNTER — Encounter (HOSPITAL_COMMUNITY): Payer: Self-pay | Admitting: Certified Registered Nurse Anesthetist

## 2014-04-20 ENCOUNTER — Inpatient Hospital Stay (HOSPITAL_COMMUNITY): Payer: Self-pay | Admitting: Speech Pathology

## 2014-04-20 HISTORY — PX: LAPAROSCOPIC GASTROSTOMY: SHX5896

## 2014-04-20 LAB — CBC
HCT: 30.9 % — ABNORMAL LOW (ref 36.0–46.0)
HEMOGLOBIN: 10.2 g/dL — AB (ref 12.0–15.0)
MCH: 26.9 pg (ref 26.0–34.0)
MCHC: 33 g/dL (ref 30.0–36.0)
MCV: 81.5 fL (ref 78.0–100.0)
PLATELETS: 236 10*3/uL (ref 150–400)
RBC: 3.79 MIL/uL — AB (ref 3.87–5.11)
RDW: 15 % (ref 11.5–15.5)
WBC: 3.1 10*3/uL — ABNORMAL LOW (ref 4.0–10.5)

## 2014-04-20 LAB — GLUCOSE, CAPILLARY
GLUCOSE-CAPILLARY: 76 mg/dL (ref 70–99)
Glucose-Capillary: 107 mg/dL — ABNORMAL HIGH (ref 70–99)
Glucose-Capillary: 80 mg/dL (ref 70–99)
Glucose-Capillary: 81 mg/dL (ref 70–99)
Glucose-Capillary: 82 mg/dL (ref 70–99)
Glucose-Capillary: 85 mg/dL (ref 70–99)
Glucose-Capillary: 87 mg/dL (ref 70–99)

## 2014-04-20 LAB — APTT
aPTT: 52 seconds — ABNORMAL HIGH (ref 24–37)
aPTT: 31 seconds (ref 24–37)

## 2014-04-20 LAB — HEPARIN LEVEL (UNFRACTIONATED)
HEPARIN UNFRACTIONATED: 0.45 [IU]/mL (ref 0.30–0.70)
Heparin Unfractionated: 0.22 IU/mL — ABNORMAL LOW (ref 0.30–0.70)

## 2014-04-20 SURGERY — CREATION, GASTROSTOMY, LAPAROSCOPIC
Anesthesia: General | Site: Abdomen

## 2014-04-20 MED ORDER — ADULT MULTIVITAMIN W/MINERALS CH: 1.0000 | ORAL_TABLET | Freq: Every day | ORAL | Status: DC

## 2014-04-20 MED ORDER — JEVITY 1.2 CAL PO LIQD
1000.0000 mL | ORAL | Status: DC
  Filled 2014-04-20 (×3): qty 1000

## 2014-04-20 MED ORDER — FENTANYL CITRATE 0.05 MG/ML IJ SOLN
INTRAMUSCULAR | Status: AC
  Filled 2014-04-20: qty 5

## 2014-04-20 MED ORDER — FENTANYL CITRATE 0.05 MG/ML IJ SOLN
INTRAMUSCULAR | Status: DC | PRN
Start: 2014-04-20 — End: 2014-04-20
  Administered 2014-04-20 (×2): 50 ug via INTRAVENOUS
  Administered 2014-04-20: 100 ug via INTRAVENOUS

## 2014-04-20 MED ORDER — MIDAZOLAM HCL 5 MG/5ML IJ SOLN
INTRAMUSCULAR | Status: DC | PRN
Start: 2014-04-20 — End: 2014-04-20

## 2014-04-20 MED ORDER — ACETAMINOPHEN 500 MG PO TABS: 500.0000 mg | ORAL_TABLET | Freq: Four times a day (QID) | ORAL | Status: DC | PRN

## 2014-04-20 MED ORDER — CEFAZOLIN SODIUM-DEXTROSE 2-3 GM-% IV SOLR
2.0000 g | INTRAVENOUS | Status: AC
  Administered 2014-04-20: 2 g via INTRAVENOUS
  Filled 2014-04-20: qty 50

## 2014-04-20 MED ORDER — ASPIRIN EC 81 MG PO TBEC
81.0000 mg | DELAYED_RELEASE_TABLET | Freq: Every day | ORAL | Status: DC
  Administered 2014-04-21 – 2014-05-01 (×11): 81 mg via ORAL
  Filled 2014-04-20 (×12): qty 1

## 2014-04-20 MED ORDER — BUPIVACAINE-EPINEPHRINE (PF) 0.25% -1:200000 IJ SOLN
INTRAMUSCULAR | Status: AC
  Filled 2014-04-20: qty 30

## 2014-04-20 MED ORDER — ROCURONIUM BROMIDE 100 MG/10ML IV SOLN
INTRAVENOUS | Status: DC | PRN
  Administered 2014-04-20: 40 mg via INTRAVENOUS

## 2014-04-20 MED ORDER — NEOSTIGMINE METHYLSULFATE 10 MG/10ML IV SOLN
INTRAVENOUS | Status: DC | PRN
Start: 2014-04-20 — End: 2014-04-20
  Administered 2014-04-20: 3 mg via INTRAVENOUS

## 2014-04-20 MED ORDER — SENNOSIDES-DOCUSATE SODIUM 8.6-50 MG PO TABS: 1.0000 | ORAL_TABLET | Freq: Every evening | ORAL | Status: DC | PRN

## 2014-04-20 MED ORDER — ATORVASTATIN CALCIUM 40 MG PO TABS: 40.0000 mg | ORAL_TABLET | Freq: Every day | ORAL | Status: DC

## 2014-04-20 MED ORDER — GLYCOPYRROLATE 0.2 MG/ML IJ SOLN
INTRAMUSCULAR | Status: DC | PRN
  Administered 2014-04-20: .4 mg via INTRAVENOUS

## 2014-04-20 MED ORDER — SODIUM CHLORIDE 0.9 % IR SOLN
Status: DC | PRN
  Administered 2014-04-20: 1000 mL

## 2014-04-20 MED ORDER — APIXABAN 5 MG PO TABS
5.0000 mg | ORAL_TABLET | Freq: Two times a day (BID) | ORAL | Status: DC
  Administered 2014-04-21 – 2014-05-01 (×20): 5 mg via ORAL
  Filled 2014-04-20 (×23): qty 1

## 2014-04-20 MED ORDER — CARBOXYMETHYLCELLULOSE SODIUM 1 % OP SOLN: 1.0000 [drp] | OPHTHALMIC | Status: DC | PRN

## 2014-04-20 MED ORDER — MIDAZOLAM HCL 2 MG/2ML IJ SOLN
INTRAMUSCULAR | Status: AC
Start: 2014-04-20 — End: 2014-04-20
  Filled 2014-04-20: qty 2

## 2014-04-20 MED ORDER — HEPARIN (PORCINE) IN NACL 100-0.45 UNIT/ML-% IJ SOLN
550.0000 [IU]/h | INTRAMUSCULAR | Status: AC
  Administered 2014-04-21: 500 [IU]/h via INTRAVENOUS
  Filled 2014-04-20 (×2): qty 250

## 2014-04-20 MED ORDER — ESCITALOPRAM OXALATE 5 MG PO TABS: 5.0000 mg | ORAL_TABLET | Freq: Every day | ORAL | Status: DC

## 2014-04-20 MED ORDER — PROPOFOL 10 MG/ML IV BOLUS
INTRAVENOUS | Status: DC | PRN
  Administered 2014-04-20: 120 mg via INTRAVENOUS

## 2014-04-20 MED ORDER — LISINOPRIL 5 MG PO TABS
5.0000 mg | ORAL_TABLET | Freq: Every day | ORAL | Status: DC
  Administered 2014-04-21 – 2014-05-01 (×11): 5 mg via ORAL
  Filled 2014-04-20 (×13): qty 1

## 2014-04-20 MED ORDER — LACTATED RINGERS IV SOLN
INTRAVENOUS | Status: DC | PRN
  Administered 2014-04-20: 15:00:00 via INTRAVENOUS

## 2014-04-20 MED ORDER — BUPIVACAINE-EPINEPHRINE 0.25% -1:200000 IJ SOLN
INTRAMUSCULAR | Status: DC | PRN
  Administered 2014-04-20: 4 mL

## 2014-04-20 MED ORDER — METOPROLOL SUCCINATE ER 25 MG PO TB24: 25.0000 mg | ORAL_TABLET | ORAL | Status: DC

## 2014-04-20 MED ORDER — DIPHENHYDRAMINE HCL 25 MG PO CAPS
25.0000 mg | ORAL_CAPSULE | Freq: Every evening | ORAL | Status: DC | PRN
Start: 2014-04-20 — End: 2014-05-01

## 2014-04-20 MED ORDER — APIXABAN 5 MG PO TABS: 5.0000 mg | ORAL_TABLET | Freq: Two times a day (BID) | ORAL | Status: DC

## 2014-04-20 MED ORDER — RESOURCE THICKENUP CLEAR PO POWD: 125.0000 | ORAL | Status: DC | PRN

## 2014-04-20 MED ORDER — ONDANSETRON HCL 4 MG/2ML IJ SOLN
INTRAMUSCULAR | Status: DC | PRN
  Administered 2014-04-20: 4 mg via INTRAVENOUS

## 2014-04-20 MED ORDER — LIDOCAINE HCL (CARDIAC) 20 MG/ML IV SOLN
INTRAVENOUS | Status: DC | PRN
  Administered 2014-04-20: 100 mg via INTRAVENOUS

## 2014-04-20 MED ORDER — SODIUM CHLORIDE 0.9 % IV SOLN: 1.0000 mg/kg | INTRAVENOUS | Status: DC

## 2014-04-20 SURGICAL SUPPLY — 61 items
BAG URINE DRAINAGE (UROLOGICAL SUPPLIES) IMPLANT
BENZOIN TINCTURE PRP APPL 2/3 (GAUZE/BANDAGES/DRESSINGS) ×4 IMPLANT
BINDER ABD UNIV 12 30-45 (MISCELLANEOUS) ×2 IMPLANT
BINDER ABD UNIV 12 45-62 (WOUND CARE) ×2 IMPLANT
BINDER ABDOMINAL 12 (MISCELLANEOUS) ×4
BINDER ABDOMINAL 46IN 62IN (WOUND CARE) ×4
BINDER BREAST MEDIUM (GAUZE/BANDAGES/DRESSINGS) ×4 IMPLANT
BLADE SURG ROTATE 9660 (MISCELLANEOUS) IMPLANT
CANISTER SUCTION 2500CC (MISCELLANEOUS) ×4 IMPLANT
CATH DRAINAGE MALECOT 26FR (CATHETERS) IMPLANT
CATH GASTROSTOMY 20FR (CATHETERS) IMPLANT
CATH MALECOT (CATHETERS)
CATH MALECOT BARD  24FR (CATHETERS)
CATH MALECOT BARD 24FR (CATHETERS) IMPLANT
CHLORAPREP W/TINT 26ML (MISCELLANEOUS) ×4 IMPLANT
COVER SURGICAL LIGHT HANDLE (MISCELLANEOUS) ×4 IMPLANT
DERMABOND ADVANCED (GAUZE/BANDAGES/DRESSINGS) ×2
DERMABOND ADVANCED .7 DNX12 (GAUZE/BANDAGES/DRESSINGS) ×2 IMPLANT
DEVICE TROCAR PUNCTURE CLOSURE (ENDOMECHANICALS) ×4 IMPLANT
DRAPE LAPAROSCOPIC ABDOMINAL (DRAPES) ×4 IMPLANT
DRAPE UTILITY XL STRL (DRAPES) ×8 IMPLANT
ELECT REM PT RETURN 9FT ADLT (ELECTROSURGICAL) ×4
ELECTRODE REM PT RTRN 9FT ADLT (ELECTROSURGICAL) ×2 IMPLANT
GAUZE SPONGE 2X2 8PLY STRL LF (GAUZE/BANDAGES/DRESSINGS) ×2 IMPLANT
GAUZE SPONGE 4X4 12PLY STRL (GAUZE/BANDAGES/DRESSINGS) ×4 IMPLANT
GLOVE BIO SURGEON STRL SZ7.5 (GLOVE) ×8 IMPLANT
GLOVE BIOGEL PI IND STRL 8 (GLOVE) ×2 IMPLANT
GLOVE BIOGEL PI INDICATOR 8 (GLOVE) ×2
GOWN STRL REUS W/ TWL LRG LVL3 (GOWN DISPOSABLE) ×4 IMPLANT
GOWN STRL REUS W/ TWL XL LVL3 (GOWN DISPOSABLE) ×2 IMPLANT
GOWN STRL REUS W/TWL LRG LVL3 (GOWN DISPOSABLE) ×4
GOWN STRL REUS W/TWL XL LVL3 (GOWN DISPOSABLE) ×2
KIT BASIN OR (CUSTOM PROCEDURE TRAY) ×4 IMPLANT
KIT ROOM TURNOVER OR (KITS) ×4 IMPLANT
LIQUID BAND (GAUZE/BANDAGES/DRESSINGS) ×4 IMPLANT
NEEDLE INSUFFLATION 14GA 120MM (NEEDLE) ×4 IMPLANT
NS IRRIG 1000ML POUR BTL (IV SOLUTION) ×4 IMPLANT
PACK GENERAL/GYN (CUSTOM PROCEDURE TRAY) ×4 IMPLANT
PAD ARMBOARD 7.5X6 YLW CONV (MISCELLANEOUS) ×8 IMPLANT
PLUG CATH AND CAP STER (CATHETERS) IMPLANT
SCISSORS LAP 5X35 DISP (ENDOMECHANICALS) ×4 IMPLANT
SET IRRIG TUBING LAPAROSCOPIC (IRRIGATION / IRRIGATOR) IMPLANT
SLEEVE ENDOPATH XCEL 5M (ENDOMECHANICALS) ×8 IMPLANT
SPONGE GAUZE 2X2 STER 10/PKG (GAUZE/BANDAGES/DRESSINGS) ×2
SPONGE GAUZE 4X4 12PLY STER LF (GAUZE/BANDAGES/DRESSINGS) ×4 IMPLANT
STAPLER VISISTAT 35W (STAPLE) ×4 IMPLANT
SUT CHROMIC 2 0 SH (SUTURE) IMPLANT
SUT ETHILON 2 0 FS 18 (SUTURE) ×8 IMPLANT
SUT MNCRL AB 3-0 PS2 18 (SUTURE) ×4 IMPLANT
SUT PDS AB 1 CTX 36 (SUTURE) IMPLANT
SUT PROLENE 2 0 SH 30 (SUTURE) ×4 IMPLANT
SUT SILK 2 0 SH (SUTURE) ×8 IMPLANT
SUT SILK 2 0 SH CR/8 (SUTURE) IMPLANT
SYR 20ML ECCENTRIC (SYRINGE) ×4 IMPLANT
TOWEL OR 17X24 6PK STRL BLUE (TOWEL DISPOSABLE) ×4 IMPLANT
TOWEL OR 17X26 10 PK STRL BLUE (TOWEL DISPOSABLE) ×4 IMPLANT
TRAY LAPAROSCOPIC (CUSTOM PROCEDURE TRAY) ×4 IMPLANT
TROCAR XCEL NON-BLD 5MMX100MML (ENDOMECHANICALS) ×8 IMPLANT
TUBE GASTROSTOMY 18F (CATHETERS) ×4 IMPLANT
TUBE MOSS GAS 18FR (TUBING) IMPLANT
TUBING INSUFFLATION (TUBING) ×4 IMPLANT

## 2014-04-20 NOTE — Transfer of Care (Signed)
Immediate Anesthesia Transfer of Care Note  Patient: Rebecca [name]  Procedure(s) Performed: Procedure(s): LAPAROSCOPIC GASTROSTOMY TUBE PLACEMENT (N/A)  Patient Location: PACU  Anesthesia Type:General  Level of Consciousness: awake  Airway & Oxygen Therapy: Patient Spontanous Breathing  Post-op Assessment: Report given to RN  Post vital signs: Reviewed and stable  Last Vitals:  Filed Vitals:   04/20/14 1422  BP: 126/77  Pulse: 53  Temp: 37.1 C  Resp: 17    Complications: No apparent anesthesia complications

## 2014-04-20 NOTE — Progress Notes (Signed)
ANTICOAGULATION CONSULT NOTE - Follow Up Consult  Pharmacy Consult for heparin Indication: atrial fibrillation and stroke  Labs:  Recent Labs  04/18/14 0030  04/19/14 0515 04/19/14 1710 04/20/14 0119  HGB 11.9*  --  10.9*  --  10.2*  HCT 36.4  --  32.9*  --  30.9*  PLT 234  --  237  --  236  APTT 29  < > 158* 105* 52*  HEPARINUNFRC 0.56  < > 1.40* 0.88* 0.45  < > = values in this interval not displayed.   Assessment: 69yo female now slightly subtherapeutic on heparin after several rate reductions.  Goal of Therapy:  Heparin level 0.3-0.5 units/ml aPTT 66-85 seconds   Plan:  Will increase heparin gtt very slightly to 500 units/hr and check PTT in 8hr.  Vernard Gambles, PharmD, BCPS  04/20/2014,3:17 AM

## 2014-04-20 NOTE — Progress Notes (Signed)
Speech Language Pathology Daily Session Note  Patient Details  Name: Rebecca [name] MRN: [id number] Date of Birth: 1945-10-21  Today's Date: 04/20/2014 SLP Individual Time: 6701-4103 SLP Individual Time Calculation (min): 30 min  Short Term Goals: Week 2: SLP Short Term Goal 1 (Week 2): Pt will initiate a timely swallow response over 75% of observable opportunities during presentations of her currently prescribed diet with mod-max assist multimodal cuing.   SLP Short Term Goal 2 (Week 2): Pt will sustain attention during basic, familiar self care tasks for 2-3 minutes with max assist.   SLP Short Term Goal 3 (Week 2): Pt will follow 1 step commands for 75% accuracy with max assist multimodal cuing.  SLP Short Term Goal 4 (Week 2): Pt will improve functional communication via any modality (i.e. gestures, pointing, facial expressions, vocalization) during structured tasks with mod assist multimodal cuing.   Skilled Therapeutic Interventions:  Pt was seen for skilled ST targeting cognitive goals.  Upon arrival, pt was partially reclined in bed, awake, and eventually agreeable to participate in ST with max encouragement.  Pt was wearing bilateral hand mitts as she has pulled out her IV several times over the last few days.  Hand mitts were moved during therapy session.  SLP facilitated the session with mod cues and extra time for sequencing and initiation to don a brief while sitting up at the edge of the bed.  At the end of today's session, pt was noted with obvious attempts to initiate functional communication via facial expressions and gesturing and was able to answer immediate personal/environmental yes/no questions via gestures with max assist.  When SLP was leaving the room, pt mouthed "bye" with mod assist cues to complete an appropriate social exchange.  Continue per current plan of care.    FIM:  Comprehension Comprehension Mode: Auditory Comprehension: 4-Understands basic 75 - 89% of  the time/requires cueing 10 - 24% of the time Expression Expression Mode: Nonverbal Expression: 2-Expresses basic 25 - 49% of the time/requires cueing 50 - 75% of the time. Uses single words/gestures. Social Interaction Social Interaction: 2-Interacts appropriately 25 - 49% of time - Needs frequent redirection. Problem Solving Problem Solving: 3-Solves basic 50 - 74% of the time/requires cueing 25 - 49% of the time Memory Memory: 1-Recognizes or recalls less than 25% of the time/requires cueing greater than 75% of the time  Pain Pain Assessment Pain Assessment: No/denies pain  Therapy/Group: Individual Therapy  Jackalyn Lombard, M.A. CCC-SLP  Chaddrick Brue, Melanee Spry 04/20/2014, 9:45 AM

## 2014-04-20 NOTE — Progress Notes (Signed)
Pt scheduled for GTube placement this morning. Carroll Sage, MD to notify that Heparin drip was still running. Procedure scheduled for later this afternoon. Heparin stopped at this time.

## 2014-04-20 NOTE — Progress Notes (Signed)
Rebecca [name] is a 69 y.o. female [birth year] [id number]  Subjective:  On IV hepain in place of Eliquis per Gen Surgery order ROS- cannot obtain due to aphasia  Objective: Vital signs in last 24 hours: Temp:  [98.7 F (37.1 C)-99.5 F (37.5 C)] 98.7 F (37.1 C) (02/01 0535) Pulse Rate:  [70-80] 70 (02/01 0535) Resp:  [17-18] 18 (02/01 0535) BP: (100-130)/(63-70) 129/65 mmHg (02/01 0535) SpO2:  [97 %-100 %] 100 % (02/01 0535) Weight:  [61 kg (134 lb 7.7 oz)] 61 kg (134 lb 7.7 oz) (02/01 0535) Weight change:  Last BM Date: 04/15/14  Intake/Output from previous day: 01/31 0701 - 02/01 0700 In: 235 [P.O.:235] Out: -   Physical Exam General: No apparent distress   Follow verbal commands but severe exp  Aphasia, no phonation Lungs: Normal effort. Lungs clear to auscultation anterior, no crackles or wheezes. Cardiovascular: Regular rate and rhythm, no edema Neurological: moves all ext, remains severely aphasic (exp >>recept) Wounds: N/A   Skin IV site looks good Lab Results: BMET    Component Value Date/Time   NA 141 04/16/2014 1255   K 4.3 04/16/2014 1255   CL 106 04/16/2014 1255   CO2 28 04/16/2014 1255   GLUCOSE 120* 04/16/2014 1255   BUN 30* 04/16/2014 1255   CREATININE 1.09 04/16/2014 1255   CALCIUM 9.2 04/16/2014 1255   GFRNONAA 51* 04/16/2014 1255   GFRAA 59* 04/16/2014 1255   CBC    Component Value Date/Time   WBC 3.1* 04/20/2014 0119   RBC 3.79* 04/20/2014 0119   HGB 10.2* 04/20/2014 0119   HCT 30.9* 04/20/2014 0119   PLT 236 04/20/2014 0119   MCV 81.5 04/20/2014 0119   MCH 26.9 04/20/2014 0119   MCHC 33.0 04/20/2014 0119   RDW 15.0 04/20/2014 0119   LYMPHSABS 1.3 04/08/2014 0730   MONOABS 0.6 04/08/2014 0730   EOSABS 0.0 04/08/2014 0730   BASOSABS 0.0 04/08/2014 0730   CBG's (last 3):    Recent Labs  04/19/14 1947 04/20/14 0041 04/20/14 0405  GLUCAP 93 107* 87   LFT's Lab Results  Component Value Date   ALT 19 04/08/2014   AST 35  04/08/2014   ALKPHOS 37* 04/08/2014   BILITOT 0.9 04/08/2014    Studies/Results: No results found.  Medications:  I have reviewed the patient's current medications. Scheduled Medications: . antiseptic oral rinse  7 mL Mouth Rinse q12n4p  . atorvastatin  40 mg Oral q1800  . chlorhexidine  15 mL Mouth Rinse BID  . cloNIDine  0.1 mg Transdermal Weekly  . escitalopram  5 mg Oral Daily  . insulin aspart  0-9 Units Subcutaneous 6 times per day  . loratadine  10 mg Oral Daily  . megestrol  400 mg Oral BID  . metoprolol tartrate  12.5 mg Per Tube BID  . multivitamin  5 mL Per Tube Daily   PRN Medications: acetaminophen, ondansetron **OR** ondansetron (ZOFRAN) IV, polyvinyl alcohol, RESOURCE THICKENUP CLEAR, senna-docusate, sorbitol, traZODone  Assessment/Plan: Principal Problem:   Cardioembolic stroke left MCA   1. 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 DVT Prophylaxis/Anticoagulation: Eliquis. Monitor for any bleeding episodes 3. Pain Management: Tylenol as needed  4. Dysphagia.[name] Dysphagia 1 nectar liquids. Follow-up speech therapy 5. Neuropsych: This patient is not capable of making decisions on her own behalf. -continue low dose trazodone for sleep/restlessness 6. Skin/Wound Care: Routine skin checks 7. Fluids/Electrolytes/Nutrition: Strict I and O follow-up chemistries 8. Atrial  fibrillation/hypertension. Presently on Eliquis and low-dose Lopressor. Follow-up cardiology services. Cardiac rate control. Patient on lisinopril 5 mg daily and hydrochlorothiazide 25 mg daily prior to admission and will resume as tolerated 9. Mood/depression. Lexapro 5 mg daily. Provide emotional support 10. Hyperlipidemia. Lipitor 11. Diabetes mellitus with peripheral neuropathy. Hemoglobin A1c 6.0.  -sugars better with initiation of TF 12. Acute on chronic diastolic congestive heart failure. Monitor for  any signs of fluid overload 13. Tobacco abuse. Counseling 14. 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 Planning open G tube placement 2/1 as unable to place IR PEG 1/29. Team previously spoke with nephrology and Fabrazyme will be scheduled at [facility] week of 2/1   Length of stay, days: 13   [name], MD 04/20/2014, 8:03 AM

## 2014-04-20 NOTE — Progress Notes (Signed)
  Subjective: Pt doing well. Heparin gtt ongoing  Objective: Vital signs in last 24 hours: Temp:  [98.7 F (37.1 C)-99.5 F (37.5 C)] 98.7 F (37.1 C) (02/01 0535) Pulse Rate:  [70-80] 70 (02/01 0535) Resp:  [17-18] 18 (02/01 0535) BP: (100-130)/(63-70) 129/65 mmHg (02/01 0535) SpO2:  [97 %-100 %] 100 % (02/01 0535) Weight:  [134 lb 7.7 oz (61 kg)] 134 lb 7.7 oz (61 kg) (02/01 0535) Last BM Date: 04/15/14  Intake/Output from previous day: 01/31 0701 - 02/01 0700 In: 235 [P.O.:235] Out: -  Intake/Output this shift:    General appearance: alert GI: soft, non-tender; bowel sounds normal; no masses,  no organomegaly  Lab Results:   Recent Labs  04/19/14 0515 04/20/14 0119  WBC 3.1* 3.1*  HGB 10.9* 10.2*  HCT 32.9* 30.9*  PLT 237 236   BMET No results for input(s): NA, K, CL, CO2, GLUCOSE, BUN, CREATININE, CALCIUM in the last 72 hours. PT/INR No results for input(s): LABPROT, INR in the last 72 hours. ABG No results for input(s): PHART, HCO3 in the last 72 hours.  Invalid input(s): PCO2, PO2  Studies/Results: No results found.  Anti-infectives: Anti-infectives    Start     Dose/Rate Route Frequency Ordered Stop   04/17/14 1306  ceFAZolin (ANCEF) 2-3 GM-% IVPB SOLR  Status:  Discontinued    CommentsLudwig Lean, Amy   : cabinet override      04/17/14 1306 04/17/14 1339   04/17/14 0000  ceFAZolin (ANCEF) IVPB 2 g/50 mL premix    Comments:  Hang on call to IR 1/29   2 g100 mL/hr over 30 Minutes Intravenous On call 04/16/14 1111 04/18/14 0000   04/14/14 0900  cephALEXin (KEFLEX) capsule 250 mg     250 mg Oral 3 times per day 04/14/14 0806 04/20/14 0559   04/13/14 1200  ciprofloxacin (CIPRO) tablet 250 mg  Status:  Discontinued     250 mg Oral 2 times daily 04/13/14 1121 04/14/14 0806      Assessment/Plan: s/p Procedure(s): LAPAROSCOPIC GASTROSTOMY TUBE PLACEMENT (N/A) OPEN GASTROSTOMY TUBE PLACEMENT - POSSIBLE (N/A) Plan for Gtube later today Heparin gtt  on hold  LOS: 13 days    Marigene Ehlers., Jed Limerick 04/20/2014

## 2014-04-20 NOTE — Progress Notes (Signed)
Occupational Therapy Note  Patient Details  Name: Rebecca [name] MRN: [id number] Date of Birth: May 11, 1945  Today's Date: 04/20/2014 OT Individual Time: 1000-1030 OT Individual Time Calculation (min): 30 min   Pt denied pain Individual Therapy  Pt resting in bed upon with arrival with bilateral mits donned. Pt initially indicated she didn't want to participate but agreed to bed level activities.  Pt engaged in bed mobility including rolling side<>side, supine<>sit, and sit<>stand.  Pt required min A for all tasks with extra time.  Focus on activity tolerance, sitting balance, and standing balance.   Lavone Neri [facility] 04/20/2014, 12:17 PM

## 2014-04-20 NOTE — Anesthesia Preprocedure Evaluation (Signed)
Anesthesia Evaluation  Patient identified by MRN, date of birth, ID band Patient awake    Reviewed: Allergy & Precautions, NPO status , Patient's Chart, lab work & pertinent test results  Airway Mallampati: II  TM Distance: >3 FB Neck ROM: Full    Dental  (+) Missing   Pulmonary          Cardiovascular hypertension, Pt. on medications and Pt. on home beta blockers Rhythm:Irregular     Neuro/Psych    GI/Hepatic   Endo/Other  diabetes  Renal/GU      Musculoskeletal   Abdominal   Peds  Hematology   Anesthesia Other Findings   Reproductive/Obstetrics                             Anesthesia Physical Anesthesia Plan  ASA: III  Anesthesia Plan: General   Post-op Pain Management:    Induction: Intravenous  Airway Management Planned: Oral ETT  Additional Equipment:   Intra-op Plan:   Post-operative Plan: Extubation in OR  Informed Consent:   Dental advisory given  Plan Discussed with: Anesthesiologist and Surgeon  Anesthesia Plan Comments:         Anesthesia Quick Evaluation

## 2014-04-20 NOTE — Progress Notes (Signed)
ANTICOAGULATION CONSULT NOTE - Follow Up Consult  Pharmacy Consult for Heparin Indication: atrial fibrillation and recent CVA  No Known Allergies  Patient Measurements: Height: 61 inches Weight: 134 lb 7.7 oz (61 kg) Heparin Dosing Weight: 61 kg  Labs:  Recent Labs  04/18/14 0030  04/19/14 0515 04/19/14 1710 04/20/14 0119 04/20/14 1100 04/20/14 1118  HGB 11.9*  --  10.9*  --  10.2*  --   --   HCT 36.4  --  32.9*  --  30.9*  --   --   PLT 234  --  237  --  236  --   --   APTT 29  < > 158* 105* 52* 31  --   HEPARINUNFRC 0.56  < > 1.40* 0.88* 0.45  --  0.22*  < > = values in this interval not displayed.  Estimated Creatinine Clearance: 42.1 mL/min (by C-G formula based on Cr of 1.09).  Assessment:   Eliquis has been on hold since 1/26 for g-tube placement.   Heparin bridge begun on 1/29.  Drip off at ~8am today for procedure.   Now post-procedure; to resume Heparin 6 hrs post-procedure per Dr. Derrell Lolling.   Eliquis 5 mg BID to resume 2/2 with pm dose (8pm).  Goal of Therapy:  Heparin level 0.3-0.7 units/ml Monitor platelets by anticoagulation protocol: Yes   Plan:    Heparin drip to resume at 3mn tonight at prior rate of 500 units/hr.   Heparin level and CBC ~ 6hrs after drip resumes.   Eliquis 5 mg PO BID to resume at 8pm on 2/2.  Heparin drip should be stopped at that time.  Stop time entered.  Rebecca [name], [place] Pager: [phone] 04/20/2014,6:12 PM

## 2014-04-20 NOTE — Progress Notes (Signed)
Per Derrell Lolling, MD Heparin drip to start 6hrs from now (12am), Pharmacy aware, and use of G Tube to be used after rounding 04-21-14 AM. G tube in place with catheter bag attached. G tube to gravity. Will continue to monitor. VS stable.

## 2014-04-20 NOTE — Anesthesia Procedure Notes (Signed)
Procedure Name: Intubation Performed by: Dairl Ponder Pre-anesthesia Checklist: Patient identified, Timeout performed, Emergency Drugs available, Suction available and Patient being monitored Patient Re-evaluated:Patient Re-evaluated prior to inductionOxygen Delivery Method: Circle system utilized Preoxygenation: Pre-oxygenation with 100% oxygen Intubation Type: IV induction Ventilation: Mask ventilation without difficulty Laryngoscope Size: Mac and 3 Grade View: Grade I Tube type: Oral Tube size: 7.0 mm Number of attempts: 1 Placement Confirmation: ETT inserted through vocal cords under direct vision,  breath sounds checked- equal and bilateral and positive ETCO2 Secured at: 21 cm Tube secured with: Tape Dental Injury: Teeth and Oropharynx as per pre-operative assessment

## 2014-04-20 NOTE — Op Note (Signed)
04/20/2014  4:30 PM  PATIENT:  Rebecca [name]  69 y.o. female  PRE-OPERATIVE DIAGNOSIS:  dysphagia  POST-OPERATIVE DIAGNOSIS:  dysphagia  PROCEDURE:  Procedure(s): LAPAROSCOPIC GASTROSTOMY TUBE PLACEMENT (N/A)  SURGEON:  Surgeon(s) and Role:    * Axel Filler, MD - Primary   ANESTHESIA:   local and general  EBL:  <5cc Total I/O In: 4205 [I.V.:4205] Out: -   BLOOD ADMINISTERED:none  DRAINS: Gastrostomy Tube   LOCAL MEDICATIONS USED:  BUPIVICAINE   SPECIMEN:  No Specimen  DISPOSITION OF SPECIMEN:  N/A  COUNTS:  YES  TOURNIQUET:  * No tourniquets in log *  DICTATION: .Dragon Dictation  PLAN OF CARE: Admit to inpatient   Details of the Procedure:  The patient was taken back to the operating room. The patient was placed in supine position with bilateral SCDs in place. The patient was prepped and draped in the usual sterile fashion.  After appropriate anitbiotics were confirmed, a time-out was confirmed and all facts were verified.  We proceeded with the laparoscopic G-tube placement portion of the procedure. A verress needle was used to insufflate the abdomen to 14mm of mercury at the right subcostal margin. This was followed by a 5mm trocar and camera. There was no injury to any intraabdominal organ. The stomach was easily visualized. A 11mm trocar was than placed at the umbilicus. A second 5mm trocar was than placed in the left lower quadrant under direct visualization. A 2-0 silk was used to place a pursestring stitch at the level of the stomach that was going to come to the anterior abdominal wall. At this time a 2-0 silks were than placed at the 9:00, 6:00, 3:00 and 12:00 position to help anchor the stomach to the anterior abdominal wall. A gastrotomy site was than made with the hook cautery. An incision was made in the skin and a tonsil hemostat was used to introduce the g-tube into the stomach. The balloon was filled with 10cc sterile saline. All 2-0 silks were  than brought up through the abdominal wall via an Endoclose device and tied down.  The balloon was brought up to the make the stomach snug to the anterior abdominal wall. This was all done under direct visualization. The bolster was set tight. It was secured in place using a 2-0 nylon x 3. The insuflation was evacuated and all trocars were removed. The trocar sites were than closed with 4-0 monocryl. The skin was dressed with steristrips and guaze and tape.   The patient was awakened from anesthesia and taken to recovery in stable condition.    We then proceeded to the laparoscopic G-tube placement portion of the procedure. A verress needle was used to insufflate the abdomen to 14mm of mercury at the right subcostal margin. This was followed by a 5mm trocar and camera. There was no injury to any intraabdominal organ. The stomach was easily visualized. A 11mm trocar was than placed at the umbilicus. A second 5mm trocar was than placed in the left lower quadrant under direct visualization. A 2-0 silk was used to place a pursestring stitch at the level of the stomach that was going to come to the anterior abdominal wall. At this time a 2-0 prolene was than placed at the 3:00 and 12:00 position to help anchor the stomach to the anterior abdominal wall. A gastrotomy site was than made with the hook cautery. An incision was made in the skin and a tonsil hemostat was used to introduce the g-tube into the  stomach. The balloon was filled with 20cc sterile saline. All 2-0 prolene was than brought up through the abdominal wall via an Endoclose device and tied down. A third and fourth prolene was than placed at the 9:00 and 6:00 position. These were brought up through the anterior abdominal wall via an Endoclose device and tied down. The balloon was brought up to the make the stomach snug to the anterior abdominal wall. This was all done under direct visualization. The bolster was set tight. It was secured in place using a  2-0 nylon x 3. The insuflation was evacuated and all trocars were removed. The trocar sites were than closed with 4-0 monocryl. The skin was dressed with steristrips and guaze and tape.   The patient was awakened from anesthesia and taken to recovery in stable condition.     PATIENT DISPOSITION:  PACU - hemodynamically stable.   Delay start of Pharmacological VTE agent (>24hrs) due to surgical blood loss or risk of bleeding: not applicable

## 2014-04-20 NOTE — Progress Notes (Addendum)
Physical Therapy Session Note  Patient Details  Name: Rebecca [name] MRN: [id number] Date of Birth: March 25, 1945  Today's Date: 04/20/2014   -     Short Term Goals: Week 2:= LTGs     Skilled Therapeutic Interventions/Progress Updates:  Pt missed 30 min tx; she refused to participate in bedside tx or to get OOB.  Pt became mildly agitated when urged to participate.  Mitts on bil hands and bed alarm on when PT left room.  Pt is awaiting PEG placement this PM.    Therapy Documentation Precautions:  Precautions Precautions: Fall Precaution Comments: pt is unstready on her feet. pt apparently knows how to turn off bed alarm Restrictions Weight Bearing Restrictions: No   Pain: Pain Assessment Pain Assessment: no indication of pain       See FIM for current functional status  Therapy/Group: Individual Therapy  Theodore Rahrig 04/20/2014, 11:36 AM

## 2014-04-20 NOTE — Anesthesia Postprocedure Evaluation (Signed)
Anesthesia Post Note  Patient: Rebecca [name]  Procedure(s) Performed: Procedure(s) (LRB): LAPAROSCOPIC GASTROSTOMY TUBE PLACEMENT (N/A)  Anesthesia type: general  Patient location: PACU  Post pain: Pain level controlled  Post assessment: Patient's Cardiovascular Status Stable  Last Vitals:  Filed Vitals:   04/20/14 1802  BP: 150/76  Pulse: 52  Temp:   Resp: 18    Post vital signs: Reviewed and stable  Level of consciousness: sedated  Complications: No apparent anesthesia complications

## 2014-04-21 ENCOUNTER — Inpatient Hospital Stay (HOSPITAL_COMMUNITY): Payer: Medicare Other | Admitting: Occupational Therapy

## 2014-04-21 ENCOUNTER — Inpatient Hospital Stay (HOSPITAL_COMMUNITY): Payer: Self-pay | Admitting: Speech Pathology

## 2014-04-21 ENCOUNTER — Encounter (HOSPITAL_COMMUNITY): Payer: Self-pay | Admitting: General Surgery

## 2014-04-21 ENCOUNTER — Inpatient Hospital Stay (HOSPITAL_COMMUNITY): Payer: Self-pay | Admitting: *Deleted

## 2014-04-21 LAB — GLUCOSE, CAPILLARY
GLUCOSE-CAPILLARY: 83 mg/dL (ref 70–99)
GLUCOSE-CAPILLARY: 94 mg/dL (ref 70–99)
Glucose-Capillary: 93 mg/dL (ref 70–99)
Glucose-Capillary: 82 mg/dL (ref 70–99)
Glucose-Capillary: 79 mg/dL (ref 70–99)
Glucose-Capillary: 79 mg/dL (ref 70–99)

## 2014-04-21 LAB — CBC
HEMATOCRIT: 34.6 % — AB (ref 36.0–46.0)
Hemoglobin: 11.6 g/dL — ABNORMAL LOW (ref 12.0–15.0)
MCH: 27.4 pg (ref 26.0–34.0)
MCHC: 33.5 g/dL (ref 30.0–36.0)
MCV: 81.8 fL (ref 78.0–100.0)
Platelets: 233 10*3/uL (ref 150–400)
RBC: 4.23 MIL/uL (ref 3.87–5.11)
RDW: 15.2 % (ref 11.5–15.5)
WBC: 5.2 10*3/uL (ref 4.0–10.5)

## 2014-04-21 LAB — HEPARIN LEVEL (UNFRACTIONATED)
Heparin Unfractionated: 0.23 IU/mL — ABNORMAL LOW (ref 0.30–0.70)
Heparin Unfractionated: 0.31 IU/mL (ref 0.30–0.70)

## 2014-04-21 MED ORDER — JEVITY 1.2 CAL PO LIQD
1000.0000 mL | ORAL | Status: DC
  Administered 2014-04-21: 1000 mL
  Filled 2014-04-21 (×2): qty 1000

## 2014-04-21 NOTE — Progress Notes (Signed)
NUTRITION FOLLOW UP/CONSULT  INTERVENTION: Initiate trickle feeds of Jevity 1.2 @ 10 ml/hr via PEG for 1 day (20 hours to account for therapy). After 1 day (24 hours later), increase tube feeding by 10 ml every 6 hours to goal rate of 70 ml/hr x 20 hours.   Tube feeding regimen at goal rate will provide 1680 kcal (100% of needs), 78 grams of protein, and 1130 ml of H2O.   Once IV fluids are discontinued, provide 100 ml free water flushes via PEG 5 times daily.   Will continue to monitor.  NUTRITION DIAGNOSIS: Inadequate oral intake related to dysphagia and poor appetite as evidenced by <25% meal completion.; ongoing  Goal: Pt to meet >/= 90% of their estimated nutrition needs; not met  Monitor:  TF initiation/tolerance, weight trend, labs  69 y.o. female  Admitting Dx: Cardioembolic stroke  ASSESSMENT: 69 y.o. right handed female with history of Fabrys disease, diastolic congestive heart failure, hypertension, atrial fibrillation maintained on Xarelto and low-dose aspirin with question of medical compliance, tobacco abuse, diabetes mellitus and peripheral neuropathy past CVA January 2015 and residual expressive aphasia. Presented 04/01/2014 with worsening aphasia and right-sided weakness as well as respiratory compromise.  PROCEDURE:(2/1): LAPAROSCOPIC GASTROSTOMY TUBE PLACEMENT  RD consulted to start trickle tube feeding today as PEG tube is now stable and ready for use. Pt is also currently on a dysphagia 1 diet with nectar thick liquids. Pt was unresponsive to questions asked during visit, however was able to nod "yes" or "no". She reports not having much of an appetite. Current plans to provide 100% of pt's nutrition via TF as po intake has been inadequate.   Will continue to monitor.  Labs and medications reviewed.  Height: Ht Readings from Last 1 Encounters:  04/02/14 5' 4"  (1.626 m)    Weight: Wt Readings from Last 1 Encounters:  04/21/14 135 lb 2.3 oz (61.3 kg)   04/12/14 137 lbs  Body mass index is 23.19 kg/(m^2).  Re-Estimated Nutritional Needs: Kcal: 1650-1850 Protein: 75-85 grams Fluid: 1.6-1.8.5 L/day  Skin: Incision on abdomen  Diet Order: DIET - DYS 1, nectar-thick   Intake/Output Summary (Last 24 hours) at 04/21/14 1133 Last data filed at 04/21/14 0800  Gross per 24 hour  Intake    500 ml  Output      0 ml  Net    500 ml    Last BM: 2/1  Labs:   Recent Labs Lab 04/16/14 1255  NA 141  K 4.3  CL 106  CO2 28  BUN 30*  CREATININE 1.09  CALCIUM 9.2  GLUCOSE 120*    CBG (last 3)   Recent Labs  04/21/14 0038 04/21/14 0418 04/21/14 0657  GLUCAP 94 93 82    Scheduled Meds: . antiseptic oral rinse  7 mL Mouth Rinse q12n4p  . apixaban  5 mg Oral BID  . aspirin EC  81 mg Oral Daily  . atorvastatin  40 mg Oral q1800  . chlorhexidine  15 mL Mouth Rinse BID  . cloNIDine  0.1 mg Transdermal Weekly  . escitalopram  5 mg Oral Daily  . insulin aspart  0-9 Units Subcutaneous 6 times per day  . lisinopril  5 mg Oral Daily  . loratadine  10 mg Oral Daily  . megestrol  400 mg Oral BID  . metoprolol tartrate  12.5 mg Per Tube BID  . multivitamin  5 mL Per Tube Daily    Continuous Infusions: . sodium chloride 75 mL/hr at 04/20/14  1803  . heparin 550 Units/hr (04/21/14 7544)    Past Medical History  Diagnosis Date  . Hypertension   . 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 Expressive aphasia 03/23/2013  . Atrial fibrillation 05/01/2013  . Tobacco abuse 05/01/2013  . Diabetes mellitus without complication   . Depression   . Stroke     2015    Past Surgical History  Procedure Laterality Date  . Tee without cardioversion N/A 03/26/2013    Procedure: TRANSESOPHAGEAL ECHOCARDIOGRAM (TEE);  Surgeon: Sanda Klein, MD;  Location: [facility] ENDOSCOPY;  Service: Cardiovascular;  Laterality: N/A;  . Cesarean section      x3  . Hip replacement Right 1990's  . Hip replacemet Left 1990's   Kallie Locks, MS, RD, LDN Pager #  [phone] After hours/ weekend pager # [phone]

## 2014-04-21 NOTE — Progress Notes (Signed)
SLP Cancellation Note  Patient Details Name: Rebecca [name] MRN: [id number] DOB: 11-08-1945   Cancelled treatment:        SLP attempted to see pt for skilled ST session.  Upon arrival, pt was partially reclined in bed, awake, and watching TV.  Upon SLP's attempts to engage pt, she closed her eyes and turned her head away from SLP.  Pt typically requires encouragement to participate in therapy; however, today pt refused/resisted all SLP's attempts to get her out of bed.  Pt nodded her head "yes" when SLP asked if pt wanted to be left alone.  As a result, pt missed 30 minutes of skilled ST.                                                                                                  Marilea Gwynne, Melanee Spry 04/21/2014, 8:22 AM

## 2014-04-21 NOTE — Plan of Care (Signed)
Problem: RH Furniture Transfers Goal: LTG Patient will perform furniture transfers w/assist (OT/PT LTG: Patient will perform furniture transfers with assistance (OT/PT).  Outcome: Not Applicable Date Met:  81/59/47 Goal DC'd due to planning for DC to NH.   Problem: RH Ambulation Goal: LTG Patient will ambulate in home environment (PT) LTG: Patient will ambulate in home environment, # of feet with assistance (PT).  Outcome: Not Applicable Date Met:  07/61/51 Goal DC'd due to plan for NHP.

## 2014-04-21 NOTE — Progress Notes (Signed)
Physical Therapy Weekly Progress Note  Patient Details  Name: Rebecca [name] MRN: [id number] Date of Birth: [birth year]  Beginning of progress report period: April 14, 2013 End of progress report period: April 21, 2013  Today's Date: 04/21/2014 PT Missed Time: 69 Minutes Missed Time Reason: Patient unwilling to participate  Patient has met 0 of 9 long term goals.  Short term goals not set due to estimated length of stay, which has been extended due to complicating medical factors and current DC disposition for NHP.  Pt continues to demonstrate decreased level of participation in therapy.     Patient continues to demonstrate the following deficits: decreased cardiorespiratory endurance, decreased righting reactions, and cognitive deficits and therefore will continue to benefit from skilled PT intervention to enhance overall performance with activity tolerance, balance and reduction of complications from immobility.  See Patient's Care Plan for progression toward long term goals.  Patient not progressing toward long term goals.  See goal revision..  Plan of care revisions: POC now focused on reducing level of care at [facility], however therapies are largely focused on any level of volitional engagement at this point. .  Skilled Therapeutic Interventions/Progress Updates:  Pt forcefully refusing PT again today despite education on the importance of OOB activity and options for bedside tx. Pt refused multiple attempts and actually shaking head "no" several times, eyes closed and turning away. Pt has all needs in reach.      Therapy Documentation Precautions:  Precautions Precautions: Fall Precaution Comments: pt is unstready on her feet. pt apparently knows how to turn off bed alarm Restrictions Weight Bearing Restrictions: No General: PT Amount of Missed Time (min): 45 Minutes PT Missed Treatment Reason: Patient unwilling to participate Vital Signs:  Pain: none    See FIM for current  functional status Kennieth Rad, PT, DPT  04/21/2014, 1:21 PM

## 2014-04-21 NOTE — Progress Notes (Signed)
Rebecca [name] is a 69 y.o. female [birth year] [id number]  Subjective:  On IV hepain in place of Eliquis per Gen Surgery order Lap assisted G tube placement yest pm, not using Tube yet Running IVF since fluid intake nil post op  ROS- cannot obtain due to aphasia  Objective: Vital signs in last 24 hours: Temp:  [97.8 F (36.6 C)-98.7 F (37.1 C)] 97.8 F (36.6 C) (02/02 0434) Pulse Rate:  [52-69] 69 (02/02 0434) Resp:  [14-18] 18 (02/02 0434) BP: (126-184)/(75-107) 141/75 mmHg (02/02 0434) SpO2:  [99 %-100 %] 99 % (02/02 0434) Weight:  [61.3 kg (135 lb 2.3 oz)] 61.3 kg (135 lb 2.3 oz) (02/02 0434) Weight change: 0.3 kg (10.6 oz) Last BM Date: 04/20/14  Intake/Output from previous day: 02/01 0701 - 02/02 0700 In: 4705 [I.V.:4705] Out: -   Physical Exam General: No apparent distress   Follow verbal commands but severe exp  Aphasia, no phonation Lungs: Normal effort. Lungs clear to auscultation anterior, no crackles or wheezes. Cardiovascular: Regular rate and rhythm, no edema Neurological: moves all ext, remains severely aphasic (exp >>recept) Wounds: N/A   Skin IV site looks good Lab Results: BMET    Component Value Date/Time   NA 141 04/16/2014 1255   K 4.3 04/16/2014 1255   CL 106 04/16/2014 1255   CO2 28 04/16/2014 1255   GLUCOSE 120* 04/16/2014 1255   BUN 30* 04/16/2014 1255   CREATININE 1.09 04/16/2014 1255   CALCIUM 9.2 04/16/2014 1255   GFRNONAA 51* 04/16/2014 1255   GFRAA 59* 04/16/2014 1255   CBC    Component Value Date/Time   WBC 3.1* 04/20/2014 0119   RBC 3.79* 04/20/2014 0119   HGB 10.2* 04/20/2014 0119   HCT 30.9* 04/20/2014 0119   PLT 236 04/20/2014 0119   MCV 81.5 04/20/2014 0119   MCH 26.9 04/20/2014 0119   MCHC 33.0 04/20/2014 0119   RDW 15.0 04/20/2014 0119   LYMPHSABS 1.3 04/08/2014 0730   MONOABS 0.6 04/08/2014 0730   EOSABS 0.0 04/08/2014 0730   BASOSABS 0.0 04/08/2014 0730   CBG's (last 3):    Recent Labs  04/21/14 0038  04/21/14 0418 04/21/14 0657  GLUCAP 94 93 82   LFT's Lab Results  Component Value Date   ALT 19 04/08/2014   AST 35 04/08/2014   ALKPHOS 37* 04/08/2014   BILITOT 0.9 04/08/2014    Studies/Results: No results found.  Medications:  I have reviewed the patient's current medications. Scheduled Medications: . antiseptic oral rinse  7 mL Mouth Rinse q12n4p  . apixaban  5 mg Oral BID  . aspirin EC  81 mg Oral Daily  . atorvastatin  40 mg Oral q1800  . chlorhexidine  15 mL Mouth Rinse BID  . cloNIDine  0.1 mg Transdermal Weekly  . escitalopram  5 mg Oral Daily  . insulin aspart  0-9 Units Subcutaneous 6 times per day  . lisinopril  5 mg Oral Daily  . loratadine  10 mg Oral Daily  . megestrol  400 mg Oral BID  . metoprolol tartrate  12.5 mg Per Tube BID  . multivitamin  5 mL Per Tube Daily   PRN Medications: acetaminophen, acetaminophen, diphenhydrAMINE, ondansetron **OR** ondansetron (ZOFRAN) IV, polyvinyl alcohol, RESOURCE THICKENUP CLEAR, senna-docusate, sorbitol, traZODone  Assessment/Plan: Principal Problem:   Cardioembolic stroke left MCA   1. Functional deficits secondary to left frontal infarct felt to be embolic secondary to atrial fibrillation on Heparin bridge until Gen surgery starts Eliquis this pm 2.  DVT Prophylaxis/Anticoagulation: Eliquis. Monitor for any bleeding episodes 3. Pain Management: Tylenol as needed  4. Dysphagia.[name] Dysphagia 1 nectar liquids. Follow-up speech therapy 5. Neuropsych: This patient is not capable of making decisions on her own behalf. -continue low dose trazodone for sleep/restlessness 6. Skin/Wound Care: Routine skin checks 7. Fluids/Electrolytes/Nutrition: Strict I and O follow-up chemistries 8. Atrial fibrillation/hypertension. Presently on Eliquis and low-dose Lopressor. Follow-up cardiology services. Cardiac rate control. Patient on lisinopril 5 mg daily and hydrochlorothiazide 25 mg daily prior to admission and will  resume as tolerated 9. Mood/depression. Lexapro 5 mg daily. Provide emotional support 10. Hyperlipidemia. Lipitor 11. Diabetes mellitus with peripheral neuropathy. Hemoglobin A1c 6.0.  -sugars better with initiation of TF 12. Acute on chronic diastolic congestive heart failure. Monitor for any signs of fluid overload 13. Tobacco abuse. Counseling 14. Poor intake cognitive based,minimal caloric and fluid intake-TF orders per Gen Surgery, pt will be offered po once cleared by Gen Surgery  15. Fabry's disease hemodynamically stabilized 1/29. per nephrology, Fabrazyme will be scheduled at [facility] week of 2/1   Length of stay, days: 14   [name], MD 04/21/2014, 7:45 AM

## 2014-04-21 NOTE — Progress Notes (Signed)
ANTICOAGULATION CONSULT NOTE - Follow Up Consult  Pharmacy Consult for heparin Indication: atrial fibrillation and CVA  No Known Allergies  Patient Measurements: Weight: 135 lb 2.3 oz (61.3 kg) Heparin Dosing Weight: 61.3 kg  Vital Signs: Temp: 97.8 F (36.6 C) (02/02 0434) Temp Source: Oral (02/02 0434) BP: 141/75 mmHg (02/02 0434) Pulse Rate: 69 (02/02 0434)  Labs:  Recent Labs  04/19/14 0515 04/19/14 1710 04/20/14 0119 04/20/14 1100 04/20/14 1118 04/21/14 0732  HGB 10.9*  --  10.2*  --   --  11.6*  HCT 32.9*  --  30.9*  --   --  34.6*  PLT 237  --  236  --   --  233  APTT 158* 105* 52* 31  --   --   HEPARINUNFRC 1.40* 0.88* 0.45  --  0.22* 0.23*    Estimated Creatinine Clearance: 42.1 mL/min (by C-G formula based on Cr of 1.09).   Medications:  Scheduled:  . antiseptic oral rinse  7 mL Mouth Rinse q12n4p  . apixaban  5 mg Oral BID  . aspirin EC  81 mg Oral Daily  . atorvastatin  40 mg Oral q1800  . chlorhexidine  15 mL Mouth Rinse BID  . cloNIDine  0.1 mg Transdermal Weekly  . escitalopram  5 mg Oral Daily  . insulin aspart  0-9 Units Subcutaneous 6 times per day  . lisinopril  5 mg Oral Daily  . loratadine  10 mg Oral Daily  . megestrol  400 mg Oral BID  . metoprolol tartrate  12.5 mg Per Tube BID  . multivitamin  5 mL Per Tube Daily   Infusions:  . sodium chloride 75 mL/hr at 04/20/14 1803  . heparin 500 Units/hr (04/21/14 0112)    Assessment: 69 yo female with afib and CVA is currently on subtherapeutic heparin.  Heparin level this am is 0.23.  Last dose of eliquis was 01/26 which should not affect heparin level anymore. CBC stable. Goal of Therapy:  Heparin level 0.3-0.5 units/ml Monitor platelets by anticoagulation protocol: Yes   Plan:  - increase heparin to 550 units/hr (patient was supratherapeutic at 600 previously). - 6 hr heparin level. Heparin to be stopped at 2000 and transitioned to eliquis then  Riann Oman, Tsz-Yin 04/21/2014,8:31  AM

## 2014-04-21 NOTE — Progress Notes (Signed)
Physical Therapy Note  Patient Details  Name: Rebecca [name] MRN: [id number] Date of Birth: 02-01-1946 Today's Date: 04/21/2014  Late entry note: After team discussion, pt's tx schedule has been changed to qd due to pt's repeated refusal to participate despite medical need. Will continue to assess appropriateness for tx until DC.  Clydene Laming, PT, DPT    Eulogio Ditch, Kayren Holck M 04/21/2014, 3:58 PM

## 2014-04-21 NOTE — Progress Notes (Signed)
ANTICOAGULATION CONSULT NOTE - Follow Up Consult  Pharmacy Consult for heparin Indication: atrial fibrillation and CVA  No Known Allergies  Patient Measurements: Weight: 138 lb 8 oz (62.823 kg) Heparin Dosing Weight:   Vital Signs: Temp: 98.5 F (36.9 C) (02/02 1412) Temp Source: Oral (02/02 1412) BP: 124/81 mmHg (02/02 1412) Pulse Rate: 56 (02/02 1412)  Labs:  Recent Labs  04/19/14 0515 04/19/14 1710 04/20/14 0119 04/20/14 1100 04/20/14 1118 04/21/14 0732 04/21/14 1520  HGB 10.9*  --  10.2*  --   --  11.6*  --   HCT 32.9*  --  30.9*  --   --  34.6*  --   PLT 237  --  236  --   --  233  --   APTT 158* 105* 52* 31  --   --   --   HEPARINUNFRC 1.40* 0.88* 0.45  --  0.22* 0.23* 0.31    Estimated Creatinine Clearance: 42.1 mL/min (by C-G formula based on Cr of 1.09).   Medications:  Scheduled:  . antiseptic oral rinse  7 mL Mouth Rinse q12n4p  . apixaban  5 mg Oral BID  . aspirin EC  81 mg Oral Daily  . atorvastatin  40 mg Oral q1800  . chlorhexidine  15 mL Mouth Rinse BID  . cloNIDine  0.1 mg Transdermal Weekly  . escitalopram  5 mg Oral Daily  . insulin aspart  0-9 Units Subcutaneous 6 times per day  . lisinopril  5 mg Oral Daily  . loratadine  10 mg Oral Daily  . megestrol  400 mg Oral BID  . metoprolol tartrate  12.5 mg Per Tube BID  . multivitamin  5 mL Per Tube Daily   Infusions:  . sodium chloride 75 mL/hr at 04/20/14 1803  . feeding supplement (JEVITY 1.2 CAL) 1,000 mL (04/21/14 1532)  . heparin 550 Units/hr (04/21/14 7530)    Assessment: 69 yo female with afib and CVA is currently on therapeutic heparin. Heparin level now is 0.31 after rate incrase. Last dose of eliquis was 01/26 which should not affect heparin level anymore.  Goal of Therapy:  Heparin level 0.3-0.5 units/ml Monitor platelets by anticoagulation protocol: Yes   Plan:  - Continue heparin to 550 units/hr - No more repeat heparin level. Heparin to be stopped at 2000 tonight and  transitioned to eliquis then   Hovanes Hymas, Tsz-Yin 04/21/2014,4:06 PM

## 2014-04-21 NOTE — Progress Notes (Signed)
Occupational Therapy Session Note  Patient Details  Name: Rebecca [name] MRN: [id number] Date of Birth: 05-31-1945  Today's Date: 04/21/2014 OT Individual Time: 0905-0920 OT Individual Time Calculation (min): 15 min    Short Term Goals: Week 2:  OT Short Term Goal 1 (Week 2): STGs were not set as pt was to have a short LOS. Awaiting nursing home placement with a therapy goal of pt participating in session with less than 75% cues.  Skilled Therapeutic Interventions/Progress Updates:    Pt received in bed. Pt awakened easily but would not make eye contact with therapist. Attempted to have pt engage in simple bathing tasks such as washing her face, but she continually pushed wash cloth away. Therapist assisted with washing her face, arms and legs without pt resistance.  Encouraged pt to sit to EOB but pt looked away. Guided her legs off bed, and then pt kicked legs towards therapist and put her legs back on the bed. Attempted to have pt sit up by assisting with her arms but she pulled her arms back with a frustrated expression. Pt just had a G tube placed yesterday and it is possible she is sore from that and can not communicate that to therapy or nursing staff. Pt had slid down in bed and adjusted into bed with pt actively using her legs and arms to pull herself up in bed.  Bed alarm reset.    Therapy Documentation Precautions:  Precautions Precautions: Fall Precaution Comments: pt is unstready on her feet. pt apparently knows how to turn off bed alarm Restrictions Weight Bearing Restrictions: No  General OT Amount of Missed Time: 30 Minutes secondary to pt refusal Vital Signs:  Pain:   ADL: ADL ADL Comments: Refer to FIM  See FIM for current functional status  Therapy/Group: Individual Therapy  Layten Aiken 04/21/2014, 9:33 AM

## 2014-04-21 NOTE — Progress Notes (Signed)
Patient ID: Rebecca [name], female   DOB: [birth year], 69 y.o.   MRN: [id number] 1 Day Post-Op  Subjective: Pt doesn't say anything  Objective: Vital signs in last 24 hours: Temp:  [97.8 F (36.6 C)-98.7 F (37.1 C)] 97.8 F (36.6 C) (02/02 0434) Pulse Rate:  [52-69] 69 (02/02 0434) Resp:  [14-18] 18 (02/02 0434) BP: (126-184)/(75-107) 141/75 mmHg (02/02 0434) SpO2:  [99 %-100 %] 99 % (02/02 0434) Weight:  [135 lb 2.3 oz (61.3 kg)] 135 lb 2.3 oz (61.3 kg) (02/02 0434) Last BM Date: 04/20/14  Intake/Output from previous day: 02/01 0701 - 02/02 0700 In: 4705 [I.V.:4705] Out: -  Intake/Output this shift:    PE: Abd: soft, minimally tender, +BS, g-tube in place with old bloody drainage noted on gauze, some bilious drainage in foley bag.  Binder in place  Lab Results:   Recent Labs  04/20/14 0119 04/21/14 0732  WBC 3.1* 5.2  HGB 10.2* 11.6*  HCT 30.9* 34.6*  PLT 236 233   BMET No results for input(s): NA, K, CL, CO2, GLUCOSE, BUN, CREATININE, CALCIUM in the last 72 hours. PT/INR No results for input(s): LABPROT, INR in the last 72 hours. CMP     Component Value Date/Time   NA 141 04/16/2014 1255   K 4.3 04/16/2014 1255   CL 106 04/16/2014 1255   CO2 28 04/16/2014 1255   GLUCOSE 120* 04/16/2014 1255   BUN 30* 04/16/2014 1255   CREATININE 1.09 04/16/2014 1255   CALCIUM 9.2 04/16/2014 1255   PROT 7.5 04/08/2014 0730   ALBUMIN 3.6 04/08/2014 0730   AST 35 04/08/2014 0730   ALT 19 04/08/2014 0730   ALKPHOS 37* 04/08/2014 0730   BILITOT 0.9 04/08/2014 0730   GFRNONAA 51* 04/16/2014 1255   GFRAA 59* 04/16/2014 1255   Lipase  No results found for: LIPASE     Studies/Results: No results found.  Anti-infectives: Anti-infectives    Start     Dose/Rate Route Frequency Ordered Stop   04/20/14 1000  ceFAZolin (ANCEF) IVPB 2 g/50 mL premix    Comments:  Pharmacy may adjust dosing strength, interval, or rate of medication as needed for optimal therapy for the  patient  Send with patient on call to the OR.  Anesthesia to complete antibiotic administration <46min prior to incision per [facility].   2 g100 mL/hr over 30 Minutes Intravenous On call to O.R. 04/20/14 0952 04/20/14 1554   04/17/14 1306  ceFAZolin (ANCEF) 2-3 GM-% IVPB SOLR  Status:  Discontinued    Comments:  Laughlin, Amy   : cabinet override      04/17/14 1306 04/17/14 1339   04/17/14 0000  ceFAZolin (ANCEF) IVPB 2 g/50 mL premix    Comments:  Hang on call to IR 1/29   2 g100 mL/hr over 30 Minutes Intravenous On call 04/16/14 1111 04/18/14 0000   04/14/14 0900  cephALEXin (KEFLEX) capsule 250 mg     250 mg Oral 3 times per day 04/14/14 0806 04/20/14 0559   04/13/14 1200  ciprofloxacin (CIPRO) tablet 250 mg  Status:  Discontinued     250 mg Oral 2 times daily 04/13/14 1121 04/14/14 0806       Assessment/Plan  1. POD 1, s/p lap g-tube placement  Plan: 1. Will clamp g-tube and have dietician evaluate and start trickle TFs and then advance per their protocol  LOS: 14 days    Myquan Schaumburg E 04/21/2014, 10:53 AM Pager: [phone]

## 2014-04-22 ENCOUNTER — Inpatient Hospital Stay (HOSPITAL_COMMUNITY): Payer: Medicare Other

## 2014-04-22 ENCOUNTER — Inpatient Hospital Stay (HOSPITAL_COMMUNITY): Payer: Self-pay | Admitting: Speech Pathology

## 2014-04-22 ENCOUNTER — Encounter (HOSPITAL_COMMUNITY): Payer: Self-pay

## 2014-04-22 DIAGNOSIS — I69391 Dysphagia following cerebral infarction: Secondary | ICD-10-CM

## 2014-04-22 LAB — GLUCOSE, CAPILLARY
GLUCOSE-CAPILLARY: 98 mg/dL (ref 70–99)
GLUCOSE-CAPILLARY: 125 mg/dL — AB (ref 70–99)
GLUCOSE-CAPILLARY: 142 mg/dL — AB (ref 70–99)
Glucose-Capillary: 102 mg/dL — ABNORMAL HIGH (ref 70–99)
Glucose-Capillary: 109 mg/dL — ABNORMAL HIGH (ref 70–99)
Glucose-Capillary: 91 mg/dL (ref 70–99)

## 2014-04-22 LAB — CBC
HEMATOCRIT: 35.2 % — AB (ref 36.0–46.0)
Hemoglobin: 11.4 g/dL — ABNORMAL LOW (ref 12.0–15.0)
MCH: 27 pg (ref 26.0–34.0)
MCHC: 32.4 g/dL (ref 30.0–36.0)
MCV: 83.4 fL (ref 78.0–100.0)
PLATELETS: 206 10*3/uL (ref 150–400)
RBC: 4.22 MIL/uL (ref 3.87–5.11)
RDW: 15.3 % (ref 11.5–15.5)
WBC: 4.9 10*3/uL (ref 4.0–10.5)

## 2014-04-22 LAB — HEPARIN LEVEL (UNFRACTIONATED): HEPARIN UNFRACTIONATED: 1.64 [IU]/mL — AB (ref 0.30–0.70)

## 2014-04-22 MED ORDER — JEVITY 1.2 CAL PO LIQD
1000.0000 mL | ORAL | Status: DC
  Administered 2014-04-22 – 2014-04-28 (×6): 1000 mL
  Filled 2014-04-22 (×14): qty 1000

## 2014-04-22 NOTE — Discharge Planning (Signed)
MEDICATION RELATED PHARMACY  - FOLLOW UP Fabrazyme  Pharmacy Request to obtain Agalsidase (Fabrazyme)  Indication: Treatment for patients with Fabry disease  Weight: 137 lb 6.4 oz (62.324 kg) No Known Allergies Estimated Creatinine Clearance: 42.1 mL/min (by C-G formula based on Cr of 1.09).  Assessment: This very expensive medication is not intended for administration during inpatient visits and thus the reimbursement model is very poor if given to a patient while they are hospitalized.  The medication has been being given q2 weeks at the [facility] short stay department as an outpatient (last dose 1/22 was missed due to hospitalization).    Since the patient will be ready for rehab discharge by the weekend to another care facility Dr. Arrie Aran has OK'd waiting until next week (Tuesday 2/9 at 8:30 am) for the medication to be administered at [facility].  Plan:  The patients medication is already available at the [facility] and will only require an order to be placed in [facility] by the short stay or pharmacy staff on Tuesday 04/28/2014.  (Contact - Clementeen Hoof, PharmD, [facility] Pharmacy Director [phone])  Short stay appointment made for the patient for Tuesday 2/9 at 08:30am. (Contact - Stephenie Acres, [facility] Short Stay, Assistant Director)  [facility] Clinical Social Worker that is available to answer questions about the case is Staci Acosta  [phone] Arrangements must be made to transport patient via family or other services to the short stay department on Tuesday 04/28/2014  Back up plan if transport to [facility] becomes impossible and the therapy will have to be set up to be administered in the SNF the [facility] Coordinator is willing to assist family PRN in getting drug delivered to the SNF.  The coordinator assigned to Ms.Golob case is:  Cristal Deer, MPH, [employer] Case Production designer, theatre/television/film, [place] Disease Tel: [phone]  Mobile: [phone]  Fax:  [phone]  [address], [place], [place] [address]  Dr. Arrie Aran is aware that if drug can't be given at the [facility] facility then it might require a repeat letter of necessity to obtain free supply of medication to be administered at the SNF - provided facility has staff trained to deliver the drug.  Zena Amos, Maurice March, BCPS Senior Clinical Coordinator [facility]  If I can be of further assistance with the care of this patient - # [phone], or pager: [phone]

## 2014-04-22 NOTE — Progress Notes (Signed)
NUTRITION FOLLOW UP/CONSULT  INTERVENTION: Once 24 hours is due from initiation of TF, increase Jevity 1.2 by 10 ml every 6 hours to goal rate of 70 ml/hr x 20 hours.   Tube feeding regimen at goal rate will provide 1680 kcal (100% of needs), 78 grams of protein, and 1130 ml of H2O.   Once IV fluids are discontinued, provide 100 ml free water flushes via PEG 5 times daily.   Will continue to monitor.  NUTRITION DIAGNOSIS: Inadequate oral intake related to dysphagia and poor appetite as evidenced by <25% meal completion.; ongoing  Goal: Pt to meet >/= 90% of their estimated nutrition needs; not met  Monitor:  TF initiation/tolerance, weight trend, labs  69 y.o. female  Admitting Dx: Cardioembolic stroke  ASSESSMENT: 69 y.o. right handed female with history of Fabrys disease, diastolic congestive heart failure, hypertension, atrial fibrillation maintained on Xarelto and low-dose aspirin with question of medical compliance, tobacco abuse, diabetes mellitus and peripheral neuropathy past CVA January 2015 and residual expressive aphasia. Presented 04/01/2014 with worsening aphasia and right-sided weakness as well as respiratory compromise.  PROCEDURE:(2/1): LAPAROSCOPIC GASTROSTOMY TUBE PLACEMENT  RD consulted to advance the tube feeding as trickle feeds have been well tolerated by the pt. Discussed with RN about TF advancement plans. Meal completion has been 0-15%. Pt however has been able to consume 2 juices this AM.   Will continue to monitor.  Labs and medications reviewed.  Height: Ht Readings from Last 1 Encounters:  04/02/14 _0  (1.626 m)    Weight: Wt Readings from Last 1 Encounters:  04/21/14 138 lb 8 oz (62.823 kg)  04/12/14 137 lbs  Body mass index is 23.76 kg/(m^2).  Re-Estimated Nutritional Needs: Kcal: 1650-1850 Protein: 75-85 grams Fluid: 1.6-1.8.5 L/day  Skin: Incision on abdomen  Diet Order: DIET - DYS 1, nectar-thick   Intake/Output Summary  (Last 24 hours) at 04/22/14 1319 Last data filed at 04/22/14 1300  Gross per 24 hour  Intake 3162.17 ml  Output      0 ml  Net 3162.17 ml    Last BM: 2/1  Labs:   Recent Labs Lab 04/16/14 1255  NA 141  K 4.3  CL 106  CO2 28  BUN 30*  CREATININE 1.09  CALCIUM 9.2  GLUCOSE 120*    CBG (last 3)   Recent Labs  04/22/14 0418 04/22/14 0745 04/22/14 1117  GLUCAP 98 102* 125*    Scheduled Meds: . antiseptic oral rinse  7 mL Mouth Rinse q12n4p  . apixaban  5 mg Oral BID  . aspirin EC  81 mg Oral Daily  . atorvastatin  40 mg Oral q1800  . chlorhexidine  15 mL Mouth Rinse BID  . cloNIDine  0.1 mg Transdermal Weekly  . escitalopram  5 mg Oral Daily  . insulin aspart  0-9 Units Subcutaneous 6 times per day  . lisinopril  5 mg Oral Daily  . loratadine  10 mg Oral Daily  . megestrol  400 mg Oral BID  . metoprolol tartrate  12.5 mg Per Tube BID  . multivitamin  5 mL Per Tube Daily    Continuous Infusions: . sodium chloride Stopped (04/22/14 0651)  . feeding supplement (JEVITY 1.2 CAL) 1,000 mL (04/22/14 1030)    Past Medical History  Diagnosis Date  . Hypertension   . 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 Expressive aphasia 03/23/2013  . Atrial fibrillation 05/01/2013  . Tobacco abuse 05/01/2013  . Diabetes mellitus without complication   . Depression   .  Stroke     2015    Past Surgical History  Procedure Laterality Date  . Tee without cardioversion N/A 03/26/2013    Procedure: TRANSESOPHAGEAL ECHOCARDIOGRAM (TEE);  Surgeon: Sanda Klein, MD;  Location: [facility] ENDOSCOPY;  Service: Cardiovascular;  Laterality: N/A;  . Cesarean section      x3  . Hip replacement Right 1990's  . Hip replacemet Left 1990's  . Laparoscopic gastrostomy N/A 04/20/2014    Procedure: LAPAROSCOPIC GASTROSTOMY TUBE PLACEMENT;  Surgeon: Ralene Ok, MD;  Location: [place];  Service: General;  Laterality: N/A;   Kallie Locks, MS, RD, LDN Pager # [phone] After hours/ weekend pager # [phone]

## 2014-04-22 NOTE — Progress Notes (Signed)
[facility] Progress Note  2 Days Post-Op  Subjective: Pt is nonverbal.  No perceived pain at G-tube site.  Sitting at nurses station in wheelchair.  Objective: Vital signs in last 24 hours: Temp:  [98.5 F (36.9 C)-98.6 F (37 C)] 98.6 F (37 C) (02/03 0436) Pulse Rate:  [56-68] 68 (02/03 0436) Resp:  [17-18] 18 (02/03 0436) BP: (124-137)/(79-81) 137/79 mmHg (02/03 0436) SpO2:  [97 %-100 %] 97 % (02/03 0436) Weight:  [138 lb 8 oz (62.823 kg)] 138 lb 8 oz (62.823 kg) (02/02 1333) Last BM Date: 04/20/14  Intake/Output from previous day: 02/02 0701 - 02/03 0700 In: 2760 [I.V.:2760] Out: -  Intake/Output this shift: Total I/O In: 402.2 [P.O.:240; NG/GT:162.2] Out: -   PE: Gen:  Alert, NAD, pleasant Abd: Soft, NT/ND, +BS, no HSM, incisions C/D/I, G tube in place in LUQ, no signs of infection   Lab Results:   Recent Labs  04/21/14 0732 04/22/14 0559  WBC 5.2 4.9  HGB 11.6* 11.4*  HCT 34.6* 35.2*  PLT 233 206   BMET No results for input(s): NA, K, CL, CO2, GLUCOSE, BUN, CREATININE, CALCIUM in the last 72 hours. PT/INR No results for input(s): LABPROT, INR in the last 72 hours. CMP     Component Value Date/Time   NA 141 04/16/2014 1255   K 4.3 04/16/2014 1255   CL 106 04/16/2014 1255   CO2 28 04/16/2014 1255   GLUCOSE 120* 04/16/2014 1255   BUN 30* 04/16/2014 1255   CREATININE 1.09 04/16/2014 1255   CALCIUM 9.2 04/16/2014 1255   PROT 7.5 04/08/2014 0730   ALBUMIN 3.6 04/08/2014 0730   AST 35 04/08/2014 0730   ALT 19 04/08/2014 0730   ALKPHOS 37* 04/08/2014 0730   BILITOT 0.9 04/08/2014 0730   GFRNONAA 51* 04/16/2014 1255   GFRAA 59* 04/16/2014 1255   Lipase  No results found for: LIPASE     Studies/Results: No results found.  Anti-infectives: Anti-infectives    Start     Dose/Rate Route Frequency Ordered Stop   04/20/14 1000  ceFAZolin (ANCEF) IVPB 2 g/50 mL premix    Comments:  Pharmacy may adjust dosing strength, interval, or  rate of medication as needed for optimal therapy for the patient  Send with patient on call to the OR.  Anesthesia to complete antibiotic administration <83min prior to incision per [facility].   2 g100 mL/hr over 30 Minutes Intravenous On call to O.R. 04/20/14 0952 04/20/14 1554   04/17/14 1306  ceFAZolin (ANCEF) 2-3 GM-% IVPB SOLR  Status:  Discontinued    Comments:  Laughlin, Amy   : cabinet override      04/17/14 1306 04/17/14 1339   04/17/14 0000  ceFAZolin (ANCEF) IVPB 2 g/50 mL premix    Comments:  Hang on call to IR 1/29   2 g100 mL/hr over 30 Minutes Intravenous On call 04/16/14 1111 04/18/14 0000   04/14/14 0900  cephALEXin (KEFLEX) capsule 250 mg     250 mg Oral 3 times per day 04/14/14 0806 04/20/14 0559   04/13/14 1200  ciprofloxacin (CIPRO) tablet 250 mg  Status:  Discontinued     250 mg Oral 2 times daily 04/13/14 1121 04/14/14 0806       Assessment/Plan 1. Apraxia/aphagia/dysphagia following CVA & POD # 2, s/p lap g-tube placement  Plan: 1.  Normal post-op course.  Continue TF's (currently at 10cc/hr) and advance per dietitian protocol to goal rate. 2.  Continue D1 diet as tolerated. 3.  OP f/u with Dr. Derrell Lolling if G-tube can be discontinued in the future.    LOS: 15 days    Rebecca [name] 04/22/2014, 9:27 AM Pager: [phone]

## 2014-04-22 NOTE — Progress Notes (Signed)
Occupational Therapy Note  Patient Details  Name: MCKENIZE COMINS MRN: [id number] Date of Birth: 02-02-1946  Today's Date: 04/22/2014 OT Missed Time: 45 Minutes Missed Time Reason: Patient unwilling/refused to participate without medical reason  Pt missed 45 mins skilled OT services.  Attempted to engage patient washing face.  Pt would not open eyes or remove hands from under cover while seated in w/c.  When covers removed patient would pull covers up around neck.  Pt would open eyes until she realized that this therapist was looking at her and then close eyes again.     Lavone Neri [facility] 04/22/2014, 10:36 AM

## 2014-04-22 NOTE — Progress Notes (Signed)
Physical Therapy Session Note  Patient Details  Name: Rebecca [name] MRN: [id number] Date of Birth: 07-12-1945  Today's Date: 04/22/2014 PT Individual Time: 1120-1205 PT Individual Time Calculation (min): 45 min   Short Term Goals: Week 2: = LTGs       Skilled Therapeutic Interventions/Progress Updates:  Pt sitting up at Nurse's station, agreeable to tx after urging by unit Diplomatic Services operational officer.  neuromuscular re-education via forced use, VCS, tactile cues ( in solarium and therapy gym) : -sustained attention to seed catalog for transporting with L hand while ambulating -hand over hand assistance for R hand to turn pages of catalog in sitting -visual scanning of items to R on pages in sitting, intermittently -grasping playing cards with R hand pincer grip and placing on table in front of her in sitting -bil hand use to gather cards together into a stack, in sitting  Gait with R hand hold assist on unlevel terra cotta tile and low carpet x 25' x 2 with mod assist initially due to L lean, progressing to min assist  Pt seemed to enjoy seeing a different setting, keeping her eyes open at least 75% of the session.    Therapy Documentation Precautions:  Precautions Precautions: Fall Precaution Comments: pt is unstready on her feet. pt apparently knows how to turn off bed alarm Restrictions Weight Bearing Restrictions: No   Pain: Pain Assessment Pain Assessment: No/denies pain   Locomotion : Ambulation Ambulation/Gait Assistance: 3: Mod assist;4: Min assist     See FIM for current functional status  Therapy/Group: Individual Therapy  Radha Coggins 04/22/2014, 12:18 PM

## 2014-04-22 NOTE — Progress Notes (Signed)
Orthopedic Tech Progress Note Patient Details:  Rebecca [name] 06-30-1945 [id number]  Ortho Devices Type of Ortho Device: Abdominal binder Ortho Device/Splint Location: abdomen Ortho Device/Splint Interventions: Freeman Caldron, Suzannah Bettes 04/22/2014, 8:26 AM

## 2014-04-22 NOTE — Progress Notes (Signed)
Speech Language Pathology Weekly Progress and Session Note  Patient Details  Name: Rebecca [name] MRN: [id number] Date of Birth: 09-17-1945  Beginning of progress report period: April 15, 2014 End of progress report period: April 22, 2014  Today's Date: 04/22/2014 SLP Individual Time: 5462-7035 SLP Individual Time Calculation (min): 33 min  Short Term Goals: Week 2: SLP Short Term Goal 1 (Week 2): Pt will initiate a timely swallow response over 75% of observable opportunities during presentations of her currently prescribed diet with mod-max assist multimodal cuing.   SLP Short Term Goal 1 - Progress (Week 2): Not met SLP Short Term Goal 2 (Week 2): Pt will sustain attention during basic, familiar self care tasks for 2-3 minutes with max assist.   SLP Short Term Goal 2 - Progress (Week 2): Not met SLP Short Term Goal 3 (Week 2): Pt will follow 1 step commands for 75% accuracy with max assist multimodal cuing.  SLP Short Term Goal 3 - Progress (Week 2): Met SLP Short Term Goal 4 (Week 2): Pt will improve functional communication via any modality (i.e. gestures, pointing, facial expressions, vocalization) during structured tasks with mod assist multimodal cuing.  SLP Short Term Goal 4 - Progress (Week 2): Not met    New Short Term Goals: Week 3: SLP Short Term Goal 1 (Week 3): Pt will initiate a timely swallow response over 75% of observable opportunities during presentations of her currently prescribed diet with mod-max assist multimodal cuing.   SLP Short Term Goal 2 (Week 3): Pt will sustain attention during basic, familiar self care tasks for 2-3 minutes with max assist.   SLP Short Term Goal 3 (Week 3): Pt will follow 1 step commands for >80% accuracy with max assist multimodal cuing.  SLP Short Term Goal 4 (Week 3): Pt will improve functional communication via any modality (i.e. gestures, pointing, facial expressions, vocalization) during structured tasks with mod assist  multimodal cuing.   Weekly Progress Updates:  Pt made slow inconsistent gains this reporting period and has met 1 out of 4 short term goals.  Pt continues to require max-total assist for basic cognitive-linguistic tasks due to decreased initiation and sequencing and poor motivation to participate in therapies.  Pt also demonstrates poor PO intake of her currently prescribed diet and declines trials of advanced consistencies.  No family has been present for training this reporting period.  Pt is now awaiting SNF placement.  Pt would continue to benefit from skilled ST while inpatient in order to maximize functional independence and reduce burden of care prior to discharge.     Intensity: Minumum of 1-2 x/day, 30 to 90 minutes Frequency: 5 out of 7 days Duration/Length of Stay: 7-10 days  Treatment/Interventions: Cognitive remediation/compensation;Cueing hierarchy;Dysphagia/aspiration precaution training;Functional tasks;Patient/family education;Oral motor exercises;Multimodal communication approach;Internal/external aids;Environmental controls;Speech/Language facilitation   Daily Session  Skilled Therapeutic Interventions: Pt was seen for skilled ST targeting goals for dysphagia.  Pt was received from RN, upright in wheelchair, awake, alert, and agreeable to participate in [place].  SLP completed skilled observations during presentations of pt's currently prescribed diet.  Pt required max assist and increased time to facilitate self feeding due to decreased initiation and decreased motivation to participate in therapies.  SLP facilitated the session with choice of 2 thin liquid drinks (coffee and diet coke) and graham crackers to facilitate improved PO intake and continue working towards diet advancement; however, pt declined to consume anything other than ~2-3 small bites of magic cup consistencies. Continue per current plan  of care.          FIM:  FIM - Eating Eating Activity: 5: Set-up assist for  open containers;4: Help with managing cup/glass;5: Needs verbal cues/supervision  Pain Pain Assessment Pain Assessment: No/denies pain  Therapy/Group: Individual Therapy  Ivadell Gaul, Selinda Orion 04/22/2014, 11:29 AM

## 2014-04-22 NOTE — Progress Notes (Signed)
Rebecca [name] is a 69 y.o. female [birth year] [id number]  Subjective:  Drinking thickened juice from cup  ROS- cannot obtain due to aphasia  Objective: Vital signs in last 24 hours: Temp:  [98.5 F (36.9 C)-98.6 F (37 C)] 98.6 F (37 C) (02/03 0436) Pulse Rate:  [56-68] 68 (02/03 0436) Resp:  [17-18] 18 (02/03 0436) BP: (124-137)/(79-81) 137/79 mmHg (02/03 0436) SpO2:  [97 %-100 %] 97 % (02/03 0436) Weight:  [62.823 kg (138 lb 8 oz)] 62.823 kg (138 lb 8 oz) (02/02 1333) Weight change: 1.523 kg (3 lb 5.7 oz) Last BM Date: 04/20/14  Intake/Output from previous day: 02/02 0701 - 02/03 0700 In: 2760 [I.V.:2760] Out: -   Physical Exam General: No apparent distress   Follow verbal commands but severe exp  Aphasia, no phonation Lungs: Normal effort. Lungs clear to auscultation anterior, no crackles or wheezes. Cardiovascular: Regular rate and rhythm, no edema Neurological: moves all ext, remains severely aphasic (exp >>recept) Wounds: N/A   Skin IV site looks good Lab Results: BMET    Component Value Date/Time   NA 141 04/16/2014 1255   K 4.3 04/16/2014 1255   CL 106 04/16/2014 1255   CO2 28 04/16/2014 1255   GLUCOSE 120* 04/16/2014 1255   BUN 30* 04/16/2014 1255   CREATININE 1.09 04/16/2014 1255   CALCIUM 9.2 04/16/2014 1255   GFRNONAA 51* 04/16/2014 1255   GFRAA 59* 04/16/2014 1255   CBC    Component Value Date/Time   WBC 4.9 04/22/2014 0559   RBC 4.22 04/22/2014 0559   HGB 11.4* 04/22/2014 0559   HCT 35.2* 04/22/2014 0559   PLT 206 04/22/2014 0559   MCV 83.4 04/22/2014 0559   MCH 27.0 04/22/2014 0559   MCHC 32.4 04/22/2014 0559   RDW 15.3 04/22/2014 0559   LYMPHSABS 1.3 04/08/2014 0730   MONOABS 0.6 04/08/2014 0730   EOSABS 0.0 04/08/2014 0730   BASOSABS 0.0 04/08/2014 0730   CBG's (last 3):    Recent Labs  04/22/14 0055 04/22/14 0418 04/22/14 0745  GLUCAP 91 98 102*   LFT's Lab Results  Component Value Date   ALT 19 04/08/2014   AST  35 04/08/2014   ALKPHOS 37* 04/08/2014   BILITOT 0.9 04/08/2014    Studies/Results: No results found.  Medications:  I have reviewed the patient's current medications. Scheduled Medications: . antiseptic oral rinse  7 mL Mouth Rinse q12n4p  . apixaban  5 mg Oral BID  . aspirin EC  81 mg Oral Daily  . atorvastatin  40 mg Oral q1800  . chlorhexidine  15 mL Mouth Rinse BID  . cloNIDine  0.1 mg Transdermal Weekly  . escitalopram  5 mg Oral Daily  . insulin aspart  0-9 Units Subcutaneous 6 times per day  . lisinopril  5 mg Oral Daily  . loratadine  10 mg Oral Daily  . megestrol  400 mg Oral BID  . metoprolol tartrate  12.5 mg Per Tube BID  . multivitamin  5 mL Per Tube Daily   PRN Medications: acetaminophen, acetaminophen, diphenhydrAMINE, ondansetron **OR** ondansetron (ZOFRAN) IV, polyvinyl alcohol, RESOURCE THICKENUP CLEAR, senna-docusate, sorbitol, traZODone  Assessment/Plan: Principal Problem:   Cardioembolic stroke left MCA   1. Functional deficits secondary to left frontal infarct felt to be embolic secondary to atrial fibrillation2. DVT Prophylaxis/Anticoagulation:back on  Eliquis. Monitor for any bleeding episodes 3. Pain Management: Tylenol as needed  4. Dysphagia.[name] Dysphagia 1 nectar liquids. Follow-up speech therapy 5. Neuropsych: This patient is not capable of  making decisions on her own behalf. -continue low dose trazodone for sleep/restlessness 6. Skin/Wound Care: Routine skin checks 7. Fluids/Electrolytes/Nutrition: Strict I and O follow-up chemistries 8. Atrial fibrillation/hypertension. Presently on Eliquis and low-dose Lopressor. Follow-up cardiology services. Cardiac rate control. Patient on lisinopril 5 mg daily and hydrochlorothiazide 25 mg daily prior to admission and will resume as tolerated 9. Mood/depression. Lexapro 5 mg daily. Provide emotional support 10. Hyperlipidemia. Lipitor 11. Diabetes mellitus with peripheral neuropathy.  Hemoglobin A1c 6.0.  -sugars better with initiation of TF 12. Acute on chronic diastolic congestive heart failure. Monitor for any signs of fluid overload 13. Tobacco abuse. Counseling 14. Poor intake cognitive based,minimal caloric and fluid intake-TF orders per Gen Surgery, tolerated 60ml/hr, slowly increasing to goal of 41ml/hr, pt will be offered po 15. Fabry's disease doing well post op after GA,  Fabrazyme will be scheduled at [facility] week of 2/1   Length of stay, days: 15   [name], MD 04/22/2014, 9:36 AM

## 2014-04-22 NOTE — Progress Notes (Signed)
Pt had of nectar juice. Extra time and max cues needed.

## 2014-04-23 ENCOUNTER — Inpatient Hospital Stay (HOSPITAL_COMMUNITY): Payer: Medicare Other | Admitting: Occupational Therapy

## 2014-04-23 ENCOUNTER — Inpatient Hospital Stay (HOSPITAL_COMMUNITY): Payer: Self-pay

## 2014-04-23 ENCOUNTER — Inpatient Hospital Stay (HOSPITAL_COMMUNITY): Payer: Self-pay | Admitting: Speech Pathology

## 2014-04-23 LAB — GLUCOSE, CAPILLARY
GLUCOSE-CAPILLARY: 122 mg/dL — AB (ref 70–99)
GLUCOSE-CAPILLARY: 98 mg/dL (ref 70–99)
Glucose-Capillary: 100 mg/dL — ABNORMAL HIGH (ref 70–99)
Glucose-Capillary: 113 mg/dL — ABNORMAL HIGH (ref 70–99)
Glucose-Capillary: 164 mg/dL — ABNORMAL HIGH (ref 70–99)
Glucose-Capillary: 210 mg/dL — ABNORMAL HIGH (ref 70–99)

## 2014-04-23 LAB — CBC
HCT: 35.7 % — ABNORMAL LOW (ref 36.0–46.0)
Hemoglobin: 12 g/dL (ref 12.0–15.0)
MCH: 27.3 pg (ref 26.0–34.0)
MCHC: 33.6 g/dL (ref 30.0–36.0)
MCV: 81.3 fL (ref 78.0–100.0)
Platelets: 201 10*3/uL (ref 150–400)
RBC: 4.39 MIL/uL (ref 3.87–5.11)
RDW: 15.1 % (ref 11.5–15.5)
WBC: 5.2 10*3/uL (ref 4.0–10.5)

## 2014-04-23 MED ORDER — FREE WATER
100.0000 mL | Freq: Every day | Status: DC
  Administered 2014-04-23 – 2014-05-01 (×40): 100 mL

## 2014-04-23 NOTE — Progress Notes (Signed)
Occupational Therapy Discharge Summary  Patient Details  Name: Rebecca [name] MRN: [id number] Date of Birth: June 20, 1945   Patient has met 5 of 9 long term goals due to improved balance.  Patient to discharge at overall Total Assist level with self care and supervision with transfers and walking to bathroom.  Patient's care partner unavailable to provide the necessary physical and cognitive assistance at discharge.    Reasons goals not met: Pt did not meet goals due to lack of participation, apraxia, and aphasia. LTGs were downgraded from min A to max A. Pt continues to need total A with dressing, bathing, toileting.  Recommendation:  Patient will benefit from ongoing skilled OT services in skilled nursing facility setting to continue to advance functional skills in the area of BADL.  Equipment: No equipment provided  Reasons for discharge: lack of progress toward goals  Patient/family agrees with progress made and goals achieved: Yes  OT Discharge ADL ADL ADL Comments: max to total A overall due to aphasia, apraxia, lack of participation Vision/Perception  Vision- Assessment Additional Comments: difficult to assess due to aphasia Perception Comments: difficult to assess due to aphasia  Cognition Overall Cognitive Status: Impaired/Different from baseline Orientation Level: Oriented to person;Other (comment) (global aphasia) Attention: Focused Focused Attention: Appears intact Behaviors:  (decreased participation) Sensation Sensation Light Touch:  (appears intact, difficult to assess) Stereognosis: Not tested Hot/Cold: Not tested Proprioception: Appears Intact Coordination Gross Motor Movements are Fluid and Coordinated: No Fine Motor Movements are Fluid and Coordinated: No Coordination and Movement Description: Impaired RUE coordination in shoulder and fingers Motor  Motor Motor: Hemiplegia;Motor apraxia Mobility     Trunk/Postural Assessment  Cervical  Assessment Cervical Assessment: Within Functional Limits Thoracic Assessment Thoracic Assessment: Within Functional Limits Lumbar Assessment Lumbar Assessment: Within Functional Limits Postural Control Postural Control: Within Functional Limits  Balance Static Sitting Balance Static Sitting - Level of Assistance: 7: Independent Static Standing Balance Static Standing - Level of Assistance: 5: Stand by assistance Dynamic Standing Balance Dynamic Standing - Level of Assistance: 5: Stand by assistance Extremity/Trunk Assessment RUE AROM (degrees) Overall AROM Right Upper Extremity: Within functional limits for tasks performed RUE Strength RUE Overall Strength: Deficits (3+/5) LUE Assessment LUE Assessment: Within Functional Limits  See FIM for current functional status  [name] 04/23/2014, 12:02 PM

## 2014-04-23 NOTE — Progress Notes (Signed)
Occupational Therapy Session Note  Patient Details  Name: Rebecca [name] MRN: [id number] Date of Birth: [birth year]  Today's Date: 04/23/2014 OT Individual Time: 1005-1105 OT Individual Time Calculation (min): 60 min    Short Term Goals: Week 2:  OT Short Term Goal 1 (Week 2): STGs were not set as pt was to have a short LOS. Awaiting nursing home placement with a therapy goal of pt participating in session with less than 75% cues.  Skilled Therapeutic Interventions/Progress Updates:    Pt seen for BADL retraining this am for toileting, bathing at sink level and dressing. Pt required maximum cues, maximum coaxing, guiding A to start tasks, and extremely long time periods to complete the following tasks: Standing from w/c and walking with RW to toilet and sitting Sitting to toilet (no void this session) Walking to sink to stand at sink Washing face Washing perineal area (refused to wash the rest of her body, therapist assisted) Total A with dressing except pt did pull her shirt down over trunk and pull her pants up Pt would not comb her hair.   Pt sat back in w/c with quick release belt and taken to nursing station.   Therapy Documentation Precautions:  Precautions Precautions: Fall Precaution Comments: pt is unstready on her feet. pt apparently knows how to turn off bed alarm Restrictions Weight Bearing Restrictions: No    Pain: no c/o pain   ADL: ADL ADL Comments: Refer to FIM   See FIM for current functional status  Therapy/Group: Individual Therapy  SAGUIER,JULIA 04/23/2014, 8:21 AM

## 2014-04-23 NOTE — Progress Notes (Signed)
Rebecca [name] is a 69 y.o. female [birth year] [id number]  Subjective:  Per nephrology Fabrazyme ok for Monday 2/8 Tolerating TF, should reach goal later today No Free H20 Flush yet ROS- cannot obtain due to aphasia  Objective: Vital signs in last 24 hours: Temp:  [98.7 F (37.1 C)] 98.7 F (37.1 C) (02/04 0356) Pulse Rate:  [53-62] 62 (02/04 0356) Resp:  [16] 16 (02/04 0356) BP: (119-122)/(69-71) 119/69 mmHg (02/04 0356) SpO2:  [99 %] 99 % (02/04 0356) Weight:  [62.8 kg (138 lb 7.2 oz)] 62.8 kg (138 lb 7.2 oz) (02/04 0356) Weight change: -0.499 kg (-1 lb 1.6 oz) Last BM Date: 04/20/14  Intake/Output from previous day: 02/03 0701 - 02/04 0700 In: 402.2 [P.O.:240; NG/GT:162.2] Out: -   Physical Exam General: No apparent distress   Follow verbal commands but severe exp  Aphasia, no phonation Lungs: Normal effort. Lungs clear to auscultation anterior, no crackles or wheezes. Cardiovascular: Regular rate and rhythm, no edema Neurological: moves all ext, remains severely aphasic (exp >>recept) Wounds: N/A   Skin IV site looks good Lab Results: BMET    Component Value Date/Time   NA 141 04/16/2014 1255   K 4.3 04/16/2014 1255   CL 106 04/16/2014 1255   CO2 28 04/16/2014 1255   GLUCOSE 120* 04/16/2014 1255   BUN 30* 04/16/2014 1255   CREATININE 1.09 04/16/2014 1255   CALCIUM 9.2 04/16/2014 1255   GFRNONAA 51* 04/16/2014 1255   GFRAA 59* 04/16/2014 1255   CBC    Component Value Date/Time   WBC 5.2 04/23/2014 0628   RBC 4.39 04/23/2014 0628   HGB 12.0 04/23/2014 0628   HCT 35.7* 04/23/2014 0628   PLT 201 04/23/2014 0628   MCV 81.3 04/23/2014 0628   MCH 27.3 04/23/2014 0628   MCHC 33.6 04/23/2014 0628   RDW 15.1 04/23/2014 0628   LYMPHSABS 1.3 04/08/2014 0730   MONOABS 0.6 04/08/2014 0730   EOSABS 0.0 04/08/2014 0730   BASOSABS 0.0 04/08/2014 0730   CBG's (last 3):    Recent Labs  04/23/14 0355 04/23/14 0640 04/23/14 1129  GLUCAP 122* 113* 98    LFT's Lab Results  Component Value Date   ALT 19 04/08/2014   AST 35 04/08/2014   ALKPHOS 37* 04/08/2014   BILITOT 0.9 04/08/2014    Studies/Results: No results found.  Medications:  I have reviewed the patient's current medications. Scheduled Medications: . antiseptic oral rinse  7 mL Mouth Rinse q12n4p  . apixaban  5 mg Oral BID  . aspirin EC  81 mg Oral Daily  . atorvastatin  40 mg Oral q1800  . chlorhexidine  15 mL Mouth Rinse BID  . cloNIDine  0.1 mg Transdermal Weekly  . escitalopram  5 mg Oral Daily  . free water  100 mL Per Tube 5 X Daily  . insulin aspart  0-9 Units Subcutaneous 6 times per day  . lisinopril  5 mg Oral Daily  . loratadine  10 mg Oral Daily  . megestrol  400 mg Oral BID  . metoprolol tartrate  12.5 mg Per Tube BID  . multivitamin  5 mL Per Tube Daily   PRN Medications: acetaminophen, acetaminophen, diphenhydrAMINE, ondansetron **OR** ondansetron (ZOFRAN) IV, polyvinyl alcohol, RESOURCE THICKENUP CLEAR, senna-docusate, sorbitol, traZODone  Assessment/Plan: Principal Problem:   Cardioembolic stroke left MCA   1. Functional deficits secondary to left frontal infarct felt to be embolic secondary to atrial fibrillation2. DVT Prophylaxis/Anticoagulation:back on  Eliquis. Monitor for any bleeding episodes 3. Pain  Management: Tylenol as needed  4. Dysphagia.[name] Dysphagia 1 nectar liquids. Minimal intake due to cognition 5. Neuropsych: This patient is not capable of making decisions on her own behalf. -continue low dose trazodone for sleep/restlessness 6. Skin/Wound Care: Routine skin checks 7. Fluids/Electrolytes/Nutrition: Strict I and O follow-up BMET 8. Atrial fibrillation/hypertension. Presently on Eliquis and low-dose Lopressor. Follow-up cardiology services. Cardiac rate control. Patient on lisinopril 5 mg daily and hydrochlorothiazide 25 mg daily prior to admission and will resume as tolerated 9. Mood/depression. Lexapro 5 mg  daily. Provide emotional support 10. Hyperlipidemia. Lipitor 11. Diabetes mellitus with peripheral neuropathy. Hemoglobin A1c 6.0.  -sugars better with initiation of TF 12. Acute on chronic diastolic congestive heart failure. Monitor for any signs of fluid overload, D/C IVF, Free H2o Flushes 13. Tobacco abuse. Counseling 14. 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, MD 04/23/2014, 2:38 PM

## 2014-04-23 NOTE — Progress Notes (Signed)
Social Work Patient ID: Rebecca [name], female   DOB: 06/12/1945, 68 y.o.   MRN: [id number]   CSW spoke with pt's son to update him on team conference.  Told him MD feels she will be ready for SNF tx on Friday, if tube feeding goal is met.  Son was given bed offers on 04-21-14 and he will tour facilities today and make a decision.  He can transport pt to SNF.  CSW spoke with pt about plan for SNF before going home and pt nodded and then looked away, but did not try to voice anything to CSW.  CSW will continue to work on plan for SNF once son makes choice.  

## 2014-04-23 NOTE — Progress Notes (Signed)
Social Work Copywriter, advertising, Production assistant, radio Signed  Patient Care Conference 04/23/2014  2:19 PM    Expand All Collapse All   Inpatient RehabilitationTeam Conference and Plan of Care Update Date: 04/22/2014   Time: 10:30 AM     Patient Name: Rebecca [name]       Medical Record Number: [id number]  Date of Birth: 10-27-1945 Sex: Female         Room/Bed: 4W23C/4W23C-01 Payor Info: Payor: MEDICARE / Plan: MEDICARE PART A AND B / Product Type: *No Product type* /    Admitting Diagnosis: L FRONTAL CVA dysphagia  Admit Date/Time:  04/07/2014  4:53 PM Admission Comments: No comment available   Primary Diagnosis:  Cardioembolic stroke Principal Problem: Cardioembolic stroke    Patient Active Problem List     Diagnosis  Date Noted   .  Dysphagia S/P CVA (cerebrovascular accident)  04/22/2014   .  Apraxia following CVA (cerebrovascular accident)  04/09/2014   .  Cardioembolic stroke  33/00/7622   .  Protein-calorie malnutrition, severe  04/05/2014   .  Global aphasia     .  Paroxysmal atrial fibrillation     .  Hyperlipidemia     .  Acute diastolic heart failure  63/33/5456   .  Elevated troponin I level     .  Essential hypertension     .  Acute on chronic combined systolic and diastolic CHF (congestive heart failure)     .  Pulmonary hypertension     .  Chronic atrial fibrillation     .  Diabetes type 2, controlled     .  HLD (hyperlipidemia)     .  Elevated troponin  04/01/2014   .  Stroke  04/01/2014   .  Aphasia  04/01/2014   .  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  Atrial fibrillation  05/01/2013   .  Tobacco abuse  05/01/2013   .  Expressive aphasia  03/23/2013   .  Diabetes mellitus  03/23/2013   .  Hypertension  03/23/2013   .  Decreased calculated GFR  03/23/2013     Expected Discharge Date:  SNF  Team Members Present: Physician leading conference: Dr. Alysia [name] Social Worker Present: Rebecca Alpers, LCSW Nurse Present: Rebecca Roberts, RN PT Present:  Rebecca [name], PT;Rebecca [name], PT OT Present: Rebecca [name], Rebecca [name], OT SLP Present: Rebecca [name], SLP PPS Coordinator present : Rebecca Nakayama, RN, CRRN        Current Status/Progress  Goal  Weekly Team Focus   Medical     G tube placed, severe apraxia,          Bowel/Bladder     Pt incont. of bowel and bladder. timed toileting inplace with pt refusing at times  manage b/b min assist  timed toileting   Swallow/Nutrition/ Hydration     Dys 1, nectar thick liquds with tube feeds   max assist with least restrictive diet    improved PO intake, trials of advanced consistencies, participation in therapy     ADL's     Limited participation. No change in progress.   supervision transfers. Goal met. Goals downgraded on 04/16/14: max A with dressing, bathing, toileting.  ADL retraining, functional mobility    Mobility     Not participating in therapies since last week, but assume relatilvely similar level of function  S basic transfers, gait x100' with S, 5 stairs with S  Participation at any level.  Communication     nonverbal   Max for multimodal communication   participation in therapies    Safety/Cognition/ Behavioral Observations    max-total assist for initiation, sustained attention, sequencing, basic functional progress   max assist   participation in therapies    Pain     no complaints of pain         Skin     G tube inplace- CDI surrounding skin   remain free of skin breakdown min assist   keep pt brief dry; turn q2-3 hrs while in bed    Rehab Goals Patient on target to meet rehab goals: Yes Rehab Goals Revised: none *See Care Plan and progress notes for long and short-term goals.    Barriers to Discharge:  poor participation, variable participation     Possible Resolutions to Barriers:   SNF placement     Discharge Planning/Teaching Needs:   Pt will continue to need rehab after CIR.  Pt's son, Rebecca [name], is agreeable to SNF transfer.  None    Team Discussion:    Pt  had G tube placed on Monday.  The earliest she will be ready for d/c would be Friday due to tube feeds being increased slowly to make sure she is tolerating them.  Pt is not consistently participating with OT.  Pt continues to do well with PT.  ST stated that pt just needs so much time to eat and complete other speech tasks.   Revisions to Treatment Plan:    none    Continued Need for Acute Rehabilitation Level of Care: The patient requires daily medical management by a physician with specialized training in physical medicine and rehabilitation for the following conditions: Daily direction of a multidisciplinary physical rehabilitation program to ensure safe treatment while eliciting the highest outcome that is of practical value to the patient.: Yes Daily medical management of patient stability for increased activity during participation in an intensive rehabilitation regime.: Yes Daily analysis of laboratory values and/or radiology reports with any subsequent need for medication adjustment of medical intervention for : Neurological problems  Rebecca [name], Rebecca [name] 04/23/2014, 2:19 PM                  Patient ID: Rebecca [name], female   DOB: 1946-01-08, 69 y.o.   MRN: [id number]

## 2014-04-23 NOTE — Progress Notes (Signed)
Speech Language Pathology Daily Session Note  Patient Details  Name: Rebecca [name] MRN: [id number] Date of Birth: 1946/01/17  Today's Date: 04/23/2014 SLP Individual Time: 0800-0830 SLP Individual Time Calculation (min): 30 min  Short Term Goals: Week 3: SLP Short Term Goal 1 (Week 3): Pt will initiate a timely swallow response over 75% of observable opportunities during presentations of her currently prescribed diet with mod-max assist multimodal cuing.   SLP Short Term Goal 2 (Week 3): Pt will sustain attention during basic, familiar self care tasks for 2-3 minutes with max assist.   SLP Short Term Goal 3 (Week 3): Pt will follow 1 step commands for >80% accuracy with max assist multimodal cuing.  SLP Short Term Goal 4 (Week 3): Pt will improve functional communication via any modality (i.e. gestures, pointing, facial expressions, vocalization) during structured tasks with mod assist multimodal cuing.   Skilled Therapeutic Interventions:  Pt was seen for skilled ST targeting goals for functional communication and dysphagia.  Pt was received upright in wheelchair from nurse tech, awake, and alert. SLP facilitated the session with presentations both of pt's currently prescribed diet and upgraded consistencies to facilitate improved PO intake.  Pt closed eyes and turned head away from therapist and declined to eat any POs offered.  Therefore, SLP facilitated the session with max-total encouragement to get pt to engage in any form of  functional communication.  Pt obviously attempted to initiate communication via vocalizations to convey immediate needs/wants x2 with max cuing from SLP; however, she remained aphonic and appeared to become discouraged afterwards.  Pt's participation in therapy did not improve when pt was presented with various structured and unstructured therapeutic options and pt actively resisted SLP's attempts to get her out of the wheelchair.  Pt returned to RN station with quick  release belt donned.    FIM:  Comprehension Comprehension Mode: Auditory Comprehension: 3-Understands basic 50 - 74% of the time/requires cueing 25 - 50%  of the time Expression Expression Mode: Nonverbal Expression: 2-Expresses basic 25 - 49% of the time/requires cueing 50 - 75% of the time. Uses single words/gestures. Social Interaction Social Interaction: 2-Interacts appropriately 25 - 49% of time - Needs frequent redirection. Problem Solving Problem Solving: 2-Solves basic 25 - 49% of the time - needs direction more than half the time to initiate, plan or complete simple activities Memory Memory: 1-Recognizes or recalls less than 25% of the time/requires cueing greater than 75% of the time  Pain Pain Assessment Pain Assessment: No/denies pain  Therapy/Group: Individual Therapy  Tashiya Souders, Melanee Spry 04/23/2014, 2:38 PM

## 2014-04-23 NOTE — Patient Care Conference (Signed)
Inpatient RehabilitationTeam Conference and Plan of Care Update Date: 04/22/2014   Time: 10:30 AM    Patient Name: Rebecca [name]      Medical Record Number: [id number]  Date of Birth: 05-20-1945 Sex: Female         Room/Bed: 4W23C/4W23C-01 Payor Info: Payor: MEDICARE / Plan: MEDICARE PART A AND B / Product Type: *No Product type* /    Admitting Diagnosis: L FRONTAL CVA dysphagia  Admit Date/Time:  04/07/2014  4:53 PM Admission Comments: No comment available   Primary Diagnosis:  Cardioembolic stroke Principal Problem: Cardioembolic stroke  Patient Active Problem List   Diagnosis Date Noted  . Dysphagia S/P CVA (cerebrovascular accident) 04/22/2014  . 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 Cardioembolic stroke 26/71/2458  . Protein-calorie malnutrition, severe 04/05/2014  . Global aphasia   . Paroxysmal atrial fibrillation   . Hyperlipidemia   . Acute diastolic heart failure 09/98/3382  . Elevated troponin I level   . Essential hypertension   . Acute on chronic combined systolic and diastolic CHF (congestive heart failure)   . Pulmonary hypertension   . Chronic atrial fibrillation   . Diabetes type 2, controlled   . HLD (hyperlipidemia)   . Elevated troponin 04/01/2014  . Stroke 04/01/2014  . Aphasia 04/01/2014  . 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 Atrial fibrillation 05/01/2013  . Tobacco abuse 05/01/2013  . Expressive aphasia 03/23/2013  . Diabetes mellitus 03/23/2013  . Hypertension 03/23/2013  . Decreased calculated GFR 03/23/2013    Expected Discharge Date:  SNF  Team Members Present: Physician leading conference: Dr. Alysia Penna Social Worker Present: Alfonse Alpers, LCSW Nurse Present: Heather Roberts, RN PT Present: Georjean Mode, PT;Blair Hobble, PT OT Present: Meriel Pica, Jules Schick, OT SLP Present: Windell Moulding, SLP PPS Coordinator present : Daiva Nakayama, RN, CRRN     Current Status/Progress Goal Weekly Team Focus   Medical   G tube placed, severe apraxia,          Bowel/Bladder   Pt incont. of bowel and bladder. timed toileting inplace with pt refusing at times  manage b/b min assist  timed toileting   Swallow/Nutrition/ Hydration   Dys 1, nectar thick liquds with tube feeds   max assist with least restrictive diet   improved PO intake, trials of advanced consistencies, participation in therapy     ADL's   Limited participation. No change in progress.  supervision transfers. Goal met. Goals downgraded on 04/16/14: max A with dressing, bathing, toileting.  ADL retraining, functional mobility   Mobility   Not participating in therapies since last week, but assume relatilvely similar level of function  S basic transfers, gait x100' with S, 5 stairs with S  Participation at any level.    Communication   nonverbal   Max for multimodal communication   participation in therapies    Safety/Cognition/ Behavioral Observations  max-total assist for initiation, sustained attention, sequencing, basic functional progress   max assist   participation in therapies    Pain   no complaints of pain         Skin   G tube inplace- CDI surrounding skin  remain free of skin breakdown min assist  keep pt brief dry; turn q2-3 hrs while in bed    Rehab Goals Patient on target to meet rehab goals: Yes Rehab Goals Revised: none *See Care Plan and progress notes for long and short-term goals.  Barriers to Discharge: poor participation, variable participation    Possible Resolutions  to Barriers:  SNF placement    Discharge Planning/Teaching Needs:  Pt will continue to need rehab after CIR.  Pt's son, [name], is agreeable to SNF transfer.  None   Team Discussion:  Pt had G tube placed on Monday.  The earliest she will be ready for d/c would be Friday due to tube feeds being increased slowly to make sure she is tolerating them.  Pt is not consistently participating with OT.  Pt continues to do well with PT.  ST  stated that pt just needs so much time to eat and complete other speech tasks.  Revisions to Treatment Plan:  none   Continued Need for Acute Rehabilitation Level of Care: The patient requires daily medical management by a physician with specialized training in physical medicine and rehabilitation for the following conditions: Daily direction of a multidisciplinary physical rehabilitation program to ensure safe treatment while eliciting the highest outcome that is of practical value to the patient.: Yes Daily medical management of patient stability for increased activity during participation in an intensive rehabilitation regime.: Yes Daily analysis of laboratory values and/or radiology reports with any subsequent need for medication adjustment of medical intervention for : Neurological problems  Brittnei Jagiello, Silvestre Mesi 04/23/2014, 2:19 PM

## 2014-04-23 NOTE — Progress Notes (Signed)
Physical Therapy Session Note  Patient Details  Name: Rebecca [name] MRN: [id number] Date of Birth: May 06, 1945  Today's Date: 04/23/2014 PT Individual Time:0910-1005 1300-1330 PT Individual Time Calculation (min):55,  30 min   Short Term Goals: = LTGs due to LOS; Awaiting SNF placement     Skilled Therapeutic Interventions/Progress Updates:     Tx 1: Nsg working on PEG tube at start of session; missed 5 minutes. Pt appeared to comprehend PT's conversation about her son driving her to next rehab facility (SNF).   Therapeutic activities: sit> stand for 5 minutes while PT adjusted abdominal binder, adjusted gown and assisted pt in donning robe.  Simulated car transfer with min assist, after demo, with mod multi modal cues for initiation and completion of task. Gait with RW x 20' with min assist, x 100' with supervision, very slowly. Up/down 5 steps with 2 rails, close supervision, self selected step to pattern leading with L foot up and R foot down. W/c>< mat stand pivot to L and R with close supervision.  Pt returned to nurse's station with quick release belt in place.  Tx 2:  Pt reluctantly participated this session. Gait off unit, on hall to 4N, as pt appears to enjoy a change of scenery, and kept her eyes open the majority of session. Gait with RW x 70' , velocity extremely slow, with many pauses while ambulating.  Pt required intermittent assistance to advance RW, as she would pause often. Pt repositions herself very effectively in w/c ; standing up with supervision to adjust clothing, and scoot completely to back of seat. Pt closed her eyes and shook her head several times during session.  Psychosocial support provided. Pt left up in w/c at nurse's station, quick release belt in place. Precautions:  Precautions Precautions: Fall Precaution Comments: pt is unstready on her feet. pt apparently knows how to turn off bed alarm Restrictions Weight Bearing Restrictions: No   Pain: Pain  Assessment Pain Assessment: No/denies pain    See FIM for current functional status  Therapy/Group: Individual Therapy  Doroteo Nickolson 04/23/2014, 5:33 PM

## 2014-04-23 NOTE — Discharge Summary (Cosign Needed Addendum)
Rebecca [name], Rebecca [name]          ACCOUNT NO.:  [id number]  MEDICAL RECORD NO.:  [id number]  LOCATION:  4W23C                        FACILITY:  [facility]  PHYSICIAN:  Erick Colace, M.D.DATE OF BIRTH:  03-18-1946  DATE OF ADMISSION:  04/07/2014 DATE OF DISCHARGE:  05/01/2014                              DISCHARGE SUMMARY   DISCHARGE DIAGNOSES: 1. 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 2. Eliquis for CVA prophylaxis with history of atrial fibrillation. 3. Dysphagia, status post gastrostomy tube on April 20, 2014. 4. Hypertension. 5. Depression. 6. Hyperlipidemia. 7. Diabetes mellitus with peripheral neuropathy. 8. Acute on chronic diastolic congestive heart failure. 9. Tobacco abuse. 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  The patient independent prior to admission living with her son.  Presented on April 01, 2014 with worsening aphasia, right-sided weakness as well as respiratory compromised, placed on BiPAP.  MRI of the brain showed acute on chronic left frontal lobe middle cerebral artery territory infarct.  Bifrontal encephalomalacia, further propagation on the right, new on the left consistent with interval bilateral posterior cerebral artery territory infarct.  MRA with decreased flow in the left MCA.  Carotid Dopplers with no ICA stenosis.  Echocardiogram with ejection fraction of 50%.  No wall motion abnormalities.  EEG negative for seizure.  The patient did not receive tPA.  Neurology consulted.  Placed on Eliquis alone as there was some question for medical compliance with Xarelto prior to admission.   Dysphagia 1 nectar thick liquid diet.  Physical and occupational therapy ongoing.  The patient was admitted for comprehensive rehab program.  PAST MEDICAL HISTORY:  See discharge diagnoses.  SOCIAL HISTORY:  Lives with her son .  She was independent with functional status upon admission to rehab services; was min mod assist ambulate 90 feet with a rolling walker; minimal assist stand, pivot transfers; and min mod assist activities of daily living.  PHYSICAL EXAMINATION:  VITAL SIGNS:  Blood pressure 146/90, pulse 61, temperature 98.5, respirations 20. GENERAL:  This was an alert female, globally aphasic.  She did follow some simple commands.  Slow to initiate tasks.  Mild right facial droop. LUNGS:  Clear to auscultation. CARDIAC:  Rate controlled. ABDOMEN:  Soft, nontender.  Good bowel sounds.  REHABILITATION HOSPITAL COURSE:  Patient was admitted to inpatient rehab services with therapies initiated on a 3-hour daily basis consisting of physical therapy, occupational therapy, speech therapy, and rehabilitation nursing.  The following issues were addressed during the patient's rehabilitation stay.  Pertaining to [name]' functional deficits secondary to left frontal lobe infarct felt to be embolic. Patient now maintained on Eliquis in relation to her CVA as well as history of atrial fibrillation followed by Cardiology Services.  She was on a dysphagia 1 nectar thick liquid diet with poor p.o. intake necessitating the need for gastrostomy tube placement under General Surgery on April 20, 2014, per Dr. Derrell Lolling and tube  feeds initiated. Her blood pressures remained well controlled on clonidine as well as lisinopril and Lopressor.  No orthostatic changes.  The patient had a history of diabetes mellitus, peripheral neuropathy.  Hemoglobin A1c of 6 with routine follow up of blood sugars.  She exhibited no other signs of fluid overload with noted history of acute on chronic  congestive heart failure.  She did have a history of tobacco abuse.  Patient and family received full counseling in regard to cessation of nicotine products.  The patient with extended history of Fabry disease with ongoing treatments of Fabrazyme which she had been receiving as an outpatient prior to admission.  Current plan was to be followed up on April 28, 2014 at 8:30 a.m. at the [facility] for Fabrazyme treatment which had been arranged prior to her discharge from the hospital.  The patient received weekly collaborative interdisciplinary team conferences to discuss estimated length of stay, family teaching, and any barriers to discharge.  She was handheld assist on the right to ambulate 25 feet x2 with moderate assistance. Activities of daily living, she engaged in bathing and dressing with min mod assistance.  She needed some encouragement at times to participate with her therapies.  The patient with limited overall progress during her rehab stay was felt by family the skilled nursing facility would be needed, bed becoming available on discharge taking place.  DISCHARGE MEDICATIONS: 1. Eliquis 5 mg b.i.d. by PEG tube. 2. Aspirin 81 mg daily by PEG. 3. Lipitor 40 mg daily by PEG. 4. Clonidine patch 0.1 mg changed weekly. 5. Lexapro 5 mg daily by PEG. 6. Lisinopril 5 mg daily by PEG. 7. Claritin 10 mg daily by PEG. 8. Lopressor suspension 12.5 mg twice daily by PEG. 9. Multivitamin liquid 5 mL daily by PEG tube.    Diet-Jevity tube feeds 340 mL 4 times daily by tube and free water 100 mL 5 times daily. Patient is also on a dysphagia 1 nectar thick liquid diet  SPECIAL INSTRUCTIONS: Patient should follow-up for ongoing Fabrazyme infusions as directed with follow-up per Dr. Esmond Plants   F/U PM&R 3-4 wks    Mariam Dollar, P.A.   ______________________________ Erick Colace, M.D.    DA/MEDQ  D:  04/23/2014  T:  04/23/2014  Job:   [id number]  cc:   Clyda Greener, MD Dr. Axel Filler Pramod P. Pearlean Brownie, MD Dr. Caleb Popp

## 2014-04-23 NOTE — Plan of Care (Signed)
Problem: RH Bathing Goal: LTG Patient will bathe with assist, cues/equipment (OT) LTG: Patient will bathe specified number of body parts with assist with/without cues using equipment (position) (OT)  Outcome: Not Met (add Reason) Pt did not meet goal due to lack of participation, apraxia, and aphasia.  Problem: RH Dressing Goal: LTG Patient will perform upper body dressing (OT) LTG Patient will perform upper body dressing with assist, with/without cues (OT).  Outcome: Not Met (add Reason) Pt did not meet goal due to lack of participation, apraxia, and aphasia. Goal: LTG Patient will perform lower body dressing w/assist (OT) LTG: Patient will perform lower body dressing with assist, with/without cues in positioning using equipment (OT)  Outcome: Not Met (add Reason) Pt did not meet goal due to lack of participation, apraxia, and aphasia.  Problem: RH Toileting Goal: LTG Patient will perform toileting w/assist, cues/equip (OT) LTG: Patient will perform toiletiing (clothes management/hygiene) with assist, with/without cues using equipment (OT)  Outcome: Not Met (add Reason) Pt did not meet goal due to lack of participation, apraxia, and aphasia.

## 2014-04-23 NOTE — Discharge Summary (Signed)
Discharge summary job [id number]

## 2014-04-24 ENCOUNTER — Inpatient Hospital Stay (HOSPITAL_COMMUNITY): Payer: Self-pay | Admitting: *Deleted

## 2014-04-24 ENCOUNTER — Encounter (HOSPITAL_COMMUNITY): Payer: Self-pay | Admitting: Occupational Therapy

## 2014-04-24 ENCOUNTER — Inpatient Hospital Stay (HOSPITAL_COMMUNITY): Payer: Self-pay | Admitting: Speech Pathology

## 2014-04-24 ENCOUNTER — Encounter (HOSPITAL_COMMUNITY)
Admission: RE | Admit: 2014-04-24 | Payer: Medicare Other | Source: Ambulatory Visit | Attending: Nephrology | Admitting: Nephrology

## 2014-04-24 LAB — GLUCOSE, CAPILLARY
GLUCOSE-CAPILLARY: 100 mg/dL — AB (ref 70–99)
Glucose-Capillary: 117 mg/dL — ABNORMAL HIGH (ref 70–99)
Glucose-Capillary: 157 mg/dL — ABNORMAL HIGH (ref 70–99)
Glucose-Capillary: 131 mg/dL — ABNORMAL HIGH (ref 70–99)
Glucose-Capillary: 157 mg/dL — ABNORMAL HIGH (ref 70–99)

## 2014-04-24 LAB — CBC
HEMATOCRIT: 34.1 % — AB (ref 36.0–46.0)
Hemoglobin: 11.5 g/dL — ABNORMAL LOW (ref 12.0–15.0)
MCH: 27.4 pg (ref 26.0–34.0)
MCHC: 33.7 g/dL (ref 30.0–36.0)
MCV: 81.2 fL (ref 78.0–100.0)
PLATELETS: 235 10*3/uL (ref 150–400)
RBC: 4.2 MIL/uL (ref 3.87–5.11)
RDW: 15.3 % (ref 11.5–15.5)
WBC: 3.8 10*3/uL — AB (ref 4.0–10.5)

## 2014-04-24 NOTE — Progress Notes (Signed)
Speech Language Pathology Discharge Summary  Patient Details  Name: Rebecca [name] MRN: [id number] Date of Birth: 10/02/1945  Today's Date: 04/24/2014 SLP Individual Time: 0800-0900 SLP Individual Time Calculation (min): 60 min   Skilled Therapeutic Interventions:  Pt was seen for skilled ST targeting dysphagia goals.  Upon arrival, pt was upright in wheelchair, awake, alert, and conveyed agreement to participate in therapy via head nod.  SLP facilitated the session with tray set to limit visual distractions; however, SLP left the TV on to facilitate a more natural eating environment for the pt.  Pt fed herself ~80% of a magic cup with close supervision for use of swallowing precautions.  Pt exhibited intermittent anterior loss of materials, which she was able to clear with max multimodal cuing from SLP.  Pt was noted with decreased oral holding in comparison to previous therapy sessions with extra time in a relatively quiet environment.  No overt s/s of aspiration were noted at bedside.  Additionally, pt spontaneously responded to a functional yes/no question with a one word utterance with extra time.  Her voice was noted to be breathy with significantly decreased vocal intensity.      Patient has met 3 of 5 long term goals.  Patient to discharge at overall Max level.  Reasons goals not met: pt has refused PO trials of advanced consistencies and has presented with limited PO intake; therefore, pt's diet has not been able to be advanced.  Furthermore, pt continues to present with only brief periods of sustained attention before requiring cues for redirection    Clinical Impression/Discharge Summary:  Pt made limited gains while inpatient and is discharging having met 3 out of 5 long term goals.  Pt currently requires max assist for basic cognitive-linguistic tasks due to decreased sustained attention as well as decreased sequencing and initiation for functional problem solving; however, suspect  pt's deficits are also highly behavioral in nature as pt has demonstrated limited participation in therapies.  Pt is currently tolerating dys 1 solids with nectar thick liquids with full supervision for use of swallowing precautions due to cognition and motor planning impairments.  Pt is discharging to SNF where it is recommended that she receive follow up speech therapy to maximize functional independence and reduce burden of care prior to discharge home.  SLP also recommends that pt have continued family education at next level of care as family has not been present for therapies while pt has been on rehab.     Care Partner:  Caregiver Able to Provide Assistance: Other (comment) (SNF)  Type of Caregiver Assistance:  (SNF)  Recommendation:  24 hour supervision/assistance;Skilled Nursing facility  Rationale for SLP Follow Up: Maximize functional communication;Maximize swallowing safety;Maximize cognitive function and independence;Reduce caregiver burden   Equipment: thickener    Reasons for discharge: Discharged from hospital   Patient/Family Agrees with Progress Made and Goals Achieved: Yes   See FIM for current functional status  PageSelinda Orion 04/24/2014, 8:44 AM

## 2014-04-24 NOTE — Progress Notes (Addendum)
Physical Therapy Discharge Summary  Patient Details  Name: Rebecca [name] MRN: [id number] Date of Birth: [birth year]  Today's Date: 04/24/2014 PT Individual Time: 1030-1100 PT Individual Time Calculation (min): 30 min    Patient has met 4 of 7 long term goals due to improved balance, ability to compensate for deficits and functional use of  right upper extremity and right lower extremity.  Patient to discharge at an ambulatory level [name].   Patient's care partner at SNF/NH  will be able to to provide the necessary physical and cognitive assistance at discharge.  Reasons goals not met: Pt's participation in therapy waned as time in rehab progressed. Initially, pt was easily engaged in OOB activity, but pt eventually needed Min A for initiation and continuation of tasks. When self-selected, pt able to easily move with S for transfers and standing balance. Despite emotional encouragement, pt seemed down about her declining medical and nutrition status as well as discharge disposition.  Recommendation:  Patient will benefit from ongoing skilled PT services in skilled nursing facility setting to continue to advance safe functional mobility, address ongoing impairments in dynamic balance, activity tolerance, neuromotor recovery, and minimize fall risk.  Equipment: No equipment provided pt moves well with RW  Reasons for discharge: lack of progress toward goals and discharge from hospital  Patient/family agrees with progress made and goals achieved: Yes - Sons in agreement with plan   Skilled PT tx performed today included dynamic sitting and standing balance during functional tasks as well as short distance gait and stairs. Pt initially resisting participation for ~70mn during various attempts in various settings including skyway and gift shop. Pt physically resisting manual facilitation for sit>stands at RW with hand-over-hand assist. Eventually, she connected with a tech who encouraged  her to engage in reaching and mobility tasks including transfer with S, dynamic sitting and standing with S, short distance gait with min-guard, and 5 stairs with 2 rails and close S. When Min A was attempted, pt resisting and retracting progress she was making. 5 stairs required ~155m to complete with step-to pattern. Pt left up at nurses station with lap belt.   PT Discharge Precautions/Restrictions Precautions Precautions: Fall Precaution Comments: Pt is unsteady in general, but does well with 1/2 UEs supported Restrictions Weight Bearing Restrictions: No Vital Signs Therapy Vitals Temp: 98.7 F (37.1 C) Temp Source: Oral Pulse Rate: 77 Resp: 17 BP: 103/81 mmHg Patient Position (if appropriate): Lying Oxygen Therapy SpO2: 99 % O2 Device: Not Delivered Pain - none reported   Vision/Perception  Vision - Assessment Eye Alignment: Within Functional Limits Perception Perception: Within Functional Limits  Cognition Overall Cognitive Status: Impaired/Different from baseline Arousal/Alertness: Awake/alert Orientation Level: Oriented to person Attention: Sustained Sustained Attention: Impaired Sustained Attention Impairment: Functional basic Memory: Impaired Memory Impairment: Storage deficit Awareness: Impaired Awareness Impairment: Emergent impairment Problem Solving: Impaired Problem Solving Impairment: Functional basic Executive Function: Initiating;Sequencing Sequencing: Impaired Sequencing Impairment: Functional basic Initiating: Impaired Initiating Impairment: Functional basic Behaviors: Other (comment) Safety/Judgment: Impaired Comments: needs a quick release belt Sensation Sensation Proprioception: Appears Intact Coordination Gross Motor Movements are Fluid and Coordinated: No Fine Motor Movements are Fluid and Coordinated: No Coordination and Movement Description: Impaired RUE coordination in shoulder and fingers. Decreased excursion and velcoity of  functional movements. Only able to assess in functional conext Motor  Motor Motor - Skilled Clinical Observations: generalized weakness Motor - Discharge Observations: Pt unable to follow commands for motor assessment tasks  Mobility Bed Mobility Bed Mobility: Not assessed Transfers Transfers:  Yes Stand Pivot Transfers: 5: Supervision Stand Pivot Transfer Details (indicate cue type and reason): Pt transfers easily when volitional with S for safety. Pt requires manual facilitation for transfers when resisting movement.  Locomotion  Ambulation Ambulation: Yes Ambulation/Gait Assistance: 4: Min assist Ambulation Distance (Feet): 100 Feet Assistive device: RW Ambulation/Gait Assistance Details: Manual facilitation for placement Stairs / Additional Locomotion Stairs: Yes Stairs Assistance: 5: Supervision Stairs Assistance Details: Visual cues for safe use of DME/AE Stair Management Technique: Two rails;Step to pattern Number of Stairs: 5 Height of Stairs: 7 Wheelchair Mobility Wheelchair Mobility: No Wheelchair Assistance: 1: +1 Total assist  Trunk/Postural Assessment  Cervical Assessment Cervical Assessment: Within Functional Limits Thoracic Assessment Thoracic Assessment: Within Functional Limits Lumbar Assessment Lumbar Assessment: Within Functional Limits Postural Control Postural Control: Within Functional Limits  Balance Balance Balance Assessed: Yes Static Sitting Balance Static Sitting - Level of Assistance: 7: Independent Static Standing Balance Static Standing - Level of Assistance: 5: Stand by assistance Dynamic Standing Balance Dynamic Standing - Balance Support: Right upper extremity supported Dynamic Standing - Level of Assistance: 5: Stand by assistance Extremity Assessment  RUE Assessment RUE Assessment: Exceptions to [facility] RUE AROM (degrees) Overall AROM Right Upper Extremity: Within functional limits for tasks performed RUE Strength RUE Overall  Strength: Deficits (3+/5) LUE Assessment LUE Assessment: Within Functional Limits RLE Assessment RLE Assessment: Exceptions to [facility] RLE Strength RLE Overall Strength Comments: R hip mildly weak for abduction and ER during gait LLE Assessment LLE Assessment: Within Functional Limits (increased hip IR ; joint prominent laterally)  See FIM for current functional status  Janisha Bueso, [place], PT, DPT  04/24/2014, 3:32 PM

## 2014-04-24 NOTE — Progress Notes (Signed)
Social Work Patient ID: Rebecca [name], female   DOB: 1945/04/23, 69 y.o.   MRN: [id number]   CSW spoke to pt's son, [name], who would like for pt to go to [facility].  CSW spoke with admissions coordinator at [facility] to make sure they still have a bed available and to update her on pt's tube feeding status and check about pt's every other week fabrzyme treatment.  She needed more information about that and CSW put her in touch with the RN and supervisor, Stephenie Acres, at [facility] so that she could answer her questions.  After that conversation, it was determined that facility is not sure they can still accept pt with the cost of the infusion.  Terri and Arboriculturist are trying to determine if pt is eligible for charity care since Medicare cannot be billed for the outpt treatment as usual due to pt requiring SNF.  [facility] cannot even assure they can accommodate pt if that all falls into place until Monday, February 8th.    CSW spoke with another facility who stated that the pt's infusion treatment would also keep them from offering pt a bed and that may be a barrier with any SNF.  CSW attempted to update son, [name], but he is not available currently due to a medical problem of his own, but CSW was able to let him know that his mother would stay the weekend and we would work on transfer to [facility] on Monday.  CSW informed Dr. Wynn Banker and Jesusita Oka, [place] of the above and that pt does not have a SNF to transfer to today.  CSW will work with team at [facility], potential SNFs, and son on Monday to figure out a d/c plan for pt.

## 2014-04-24 NOTE — Plan of Care (Signed)
Problem: RH Swallowing Goal: LTG Patient will demonstrate a functional change in (SLP) LTG: Patient will demonstrate a functional change in oral/oropharyngeal swallow as evidenced by an objective assessment (SLP)  Outcome: Not Met (add Reason) Pt's diet has not been advanced while inpatient due to pt refusing trials of upgraded consistencies and overall limited PO intake   Problem: RH Attention Goal: LTG Patient will demonstrate focused/sustained (SLP) LTG: Patient will demonstrate focused/sustained/selective/alternating/divided attention during cognitive/linguistic activities in specific environment with assist for # of minutes (SLP)  Outcome: Not Met (add Reason) Pt's sustained attention lasts for periods of approximately 2-3 minutes before pt requires cues for redirection

## 2014-04-24 NOTE — Progress Notes (Signed)
Rebecca [name] is a 69 y.o. female 09/08/1945 [id number]  Subjective: Working with SLP on po Per nephrology Fabrazyme ok for Monday 2/8  No Free H20 Flush yet ROS- cannot obtain due to aphasia  Objective: Vital signs in last 24 hours: Temp:  [98.5 F (36.9 C)] 98.5 F (36.9 C) (02/05 0457) Pulse Rate:  [68-81] 81 (02/05 0457) Resp:  [19] 19 (02/05 0457) BP: (119-124)/(70-72) 119/72 mmHg (02/05 0457) SpO2:  [100 %] 100 % (02/05 0457) Weight:  [63.3 kg (139 lb 8.8 oz)] 63.3 kg (139 lb 8.8 oz) (02/05 0457) Weight change: 0.976 kg (2 lb 2.4 oz) Last BM Date: 04/20/14  Intake/Output from previous day: 02/04 0701 - 02/05 0700 In: 1113 [P.O.:85; NG/GT:1028] Out: -   Physical Exam General: No apparent distress   Follow verbal commands but severe exp  Aphasia, no phonation Lungs: Normal effort. Lungs clear to auscultation anterior, no crackles or wheezes. Cardiovascular: Regular rate and rhythm, no edema Neurological: moves all ext, remains severely aphasic (exp >>recept) Wounds: Gastrostomy site.      Skin IV site looks good Lab Results: BMET    Component Value Date/Time   NA 141 04/16/2014 1255   K 4.3 04/16/2014 1255   CL 106 04/16/2014 1255   CO2 28 04/16/2014 1255   GLUCOSE 120* 04/16/2014 1255   BUN 30* 04/16/2014 1255   CREATININE 1.09 04/16/2014 1255   CALCIUM 9.2 04/16/2014 1255   GFRNONAA 51* 04/16/2014 1255   GFRAA 59* 04/16/2014 1255   CBC    Component Value Date/Time   WBC 3.8* 04/24/2014 0612   RBC 4.20 04/24/2014 0612   HGB 11.5* 04/24/2014 0612   HCT 34.1* 04/24/2014 0612   PLT 235 04/24/2014 0612   MCV 81.2 04/24/2014 0612   MCH 27.4 04/24/2014 0612   MCHC 33.7 04/24/2014 0612   RDW 15.3 04/24/2014 0612   LYMPHSABS 1.3 04/08/2014 0730   MONOABS 0.6 04/08/2014 0730   EOSABS 0.0 04/08/2014 0730   BASOSABS 0.0 04/08/2014 0730   CBG's (last 3):    Recent Labs  04/23/14 1958 04/24/14 0123 04/24/14 0405  GLUCAP 164* 117* 157*    LFT's Lab Results  Component Value Date   ALT 19 04/08/2014   AST 35 04/08/2014   ALKPHOS 37* 04/08/2014   BILITOT 0.9 04/08/2014    Studies/Results: No results found.  Medications:  I have reviewed the patient's current medications. Scheduled Medications: . antiseptic oral rinse  7 mL Mouth Rinse q12n4p  . apixaban  5 mg Oral BID  . aspirin EC  81 mg Oral Daily  . atorvastatin  40 mg Oral q1800  . chlorhexidine  15 mL Mouth Rinse BID  . cloNIDine  0.1 mg Transdermal Weekly  . escitalopram  5 mg Oral Daily  . free water  100 mL Per Tube 5 X Daily  . insulin aspart  0-9 Units Subcutaneous 6 times per day  . lisinopril  5 mg Oral Daily  . loratadine  10 mg Oral Daily  . megestrol  400 mg Oral BID  . metoprolol tartrate  12.5 mg Per Tube BID  . multivitamin  5 mL Per Tube Daily   PRN Medications: acetaminophen, acetaminophen, diphenhydrAMINE, ondansetron **OR** ondansetron (ZOFRAN) IV, polyvinyl alcohol, RESOURCE THICKENUP CLEAR, senna-docusate, sorbitol, traZODone  Assessment/Plan: Principal Problem:   Cardioembolic stroke left MCA   1. Functional deficits secondary to left frontal infarct felt to be embolic secondary to atrial fibrillation2. DVT Prophylaxis/Anticoagulation:back on  Eliquis. Monitor for any bleeding episodes  3. Pain Management: Tylenol as needed  4. Dysphagia.[name] Dysphagia 1 nectar liquids. Minimal intake due to cognition 5. Neuropsych: This patient is not capable of making decisions on her own behalf. -continue low dose trazodone for sleep/restlessness 6. Skin/Wound Care: Routine skin checks 7. Fluids/Electrolytes/Nutrition: Strict I and O follow-up BMET 8. Atrial fibrillation/hypertension. Presently on Eliquis and low-dose Lopressor. Follow-up cardiology services. Cardiac rate control. Patient on lisinopril 5 mg daily and hydrochlorothiazide 25 mg daily prior to admission and will resume as tolerated 9. Mood/depression. Lexapro 5 mg  daily. Provide emotional support 10. Hyperlipidemia. Lipitor 11. Diabetes mellitus with peripheral neuropathy. Hemoglobin A1c 6.0.  -sugars better with initiation of TF 12. Acute on chronic diastolic congestive heart failure. Monitor for any signs of fluid overload, D/C IVF, Free H2o Flushes 13. Tobacco abuse. Counseling 14. Poor intake cognitive based,minimal caloric and fluid intake-TF orders per Gen Surgery, tolerated 1ml/hr, slowly increasing to goal of 40ml/hr, pt will be offered po 15. Fabry's disease doing well post op after GA,  Fabrazyme will be scheduled at [facility] week of 2/8   Length of stay, days: 17   [name], MD 04/24/2014, 8:26 AM

## 2014-04-24 NOTE — Plan of Care (Addendum)
Problem: RH Bed Mobility Goal: LTG Patient will perform bed mobility with assist (PT) LTG: Patient will perform bed mobility with assistance, with/without cues (PT).  Outcome: Not Met (add Reason) Goal not met due to pt needing manual facilitation encourage OOB activity. Pt could likely perform bed mobility at Mod I level, but she has not self-selected this activity lately.   Problem: RH Ambulation Goal: LTG Patient will ambulate in controlled environment (PT) LTG: Patient will ambulate in a controlled environment, # of feet with assistance (PT).  Outcome: Not Met (add Reason) Pt has needed Min-guard A for gait with/without RW to encourage forward advancement as well as occasional balance.   Problem: RH Memory Goal: LTG Patient demonstrate ability for day to day recall (PT) LTG: Patient will demonstrate ability for day to day recall/carryover during mobility activities with assist (PT)  Outcome: Not Met (add Reason) Pt has demonstrated little improvement in memory, or participation in memory tasks during functional mobility.

## 2014-04-25 ENCOUNTER — Inpatient Hospital Stay (HOSPITAL_COMMUNITY): Payer: Self-pay | Admitting: *Deleted

## 2014-04-25 LAB — GLUCOSE, CAPILLARY
GLUCOSE-CAPILLARY: 125 mg/dL — AB (ref 70–99)
GLUCOSE-CAPILLARY: 137 mg/dL — AB (ref 70–99)
GLUCOSE-CAPILLARY: 140 mg/dL — AB (ref 70–99)
Glucose-Capillary: 111 mg/dL — ABNORMAL HIGH (ref 70–99)
Glucose-Capillary: 130 mg/dL — ABNORMAL HIGH (ref 70–99)
Glucose-Capillary: 137 mg/dL — ABNORMAL HIGH (ref 70–99)
Glucose-Capillary: 159 mg/dL — ABNORMAL HIGH (ref 70–99)
Glucose-Capillary: 163 mg/dL — ABNORMAL HIGH (ref 70–99)

## 2014-04-25 LAB — CBC
HEMATOCRIT: 33 % — AB (ref 36.0–46.0)
Hemoglobin: 11.2 g/dL — ABNORMAL LOW (ref 12.0–15.0)
MCH: 27.7 pg (ref 26.0–34.0)
MCHC: 33.9 g/dL (ref 30.0–36.0)
MCV: 81.5 fL (ref 78.0–100.0)
PLATELETS: 219 10*3/uL (ref 150–400)
RBC: 4.05 MIL/uL (ref 3.87–5.11)
RDW: 15.4 % (ref 11.5–15.5)
WBC: 3.6 10*3/uL — ABNORMAL LOW (ref 4.0–10.5)

## 2014-04-25 NOTE — Progress Notes (Signed)
Rebecca [name] is a 69 y.o. female 1945-08-01 [id number]  Subjective: No SNF transfer, concerns about Fabrazyme funding Per nephrology Fabrazyme ok for Monday 2/8   ROS- cannot obtain due to aphasia  Objective: Vital signs in last 24 hours: Temp:  [97.4 F (36.3 C)-98.7 F (37.1 C)] 97.4 F (36.3 C) (02/06 0506) Pulse Rate:  [77-80] 80 (02/06 0506) Resp:  [17-18] 18 (02/06 0506) BP: (103-118)/(78-84) 117/84 mmHg (02/06 0506) SpO2:  [99 %-100 %] 100 % (02/06 0506) Weight:  [65.063 kg (143 lb 7 oz)] 65.063 kg (143 lb 7 oz) (02/06 0444) Weight change: 1.763 kg (3 lb 14.2 oz) Last BM Date: 04/20/14  Intake/Output from previous day: 02/05 0701 - 02/06 0700 In: 160 [P.O.:160] Out: -   Physical Exam General: No apparent distress   Follow verbal commands but severe exp  Aphasia, no phonation Lungs: Normal effort. Lungs clear to auscultation anterior, no crackles or wheezes. Cardiovascular: Regular rate and rhythm, no edema Neurological: moves all ext, remains severely aphasic (exp >>recept) Wounds: Gastrostomy site CDI.      Ext no edema Lab Results: BMET    Component Value Date/Time   NA 141 04/16/2014 1255   K 4.3 04/16/2014 1255   CL 106 04/16/2014 1255   CO2 28 04/16/2014 1255   GLUCOSE 120* 04/16/2014 1255   BUN 30* 04/16/2014 1255   CREATININE 1.09 04/16/2014 1255   CALCIUM 9.2 04/16/2014 1255   GFRNONAA 51* 04/16/2014 1255   GFRAA 59* 04/16/2014 1255   CBC    Component Value Date/Time   WBC 3.6* 04/25/2014 0548   RBC 4.05 04/25/2014 0548   HGB 11.2* 04/25/2014 0548   HCT 33.0* 04/25/2014 0548   PLT 219 04/25/2014 0548   MCV 81.5 04/25/2014 0548   MCH 27.7 04/25/2014 0548   MCHC 33.9 04/25/2014 0548   RDW 15.4 04/25/2014 0548   LYMPHSABS 1.3 04/08/2014 0730   MONOABS 0.6 04/08/2014 0730   EOSABS 0.0 04/08/2014 0730   BASOSABS 0.0 04/08/2014 0730   CBG's (last 3):    Recent Labs  04/24/14 2357 04/25/14 0336 04/25/14 0740  GLUCAP 111* 163*  159*   LFT's Lab Results  Component Value Date   ALT 19 04/08/2014   AST 35 04/08/2014   ALKPHOS 37* 04/08/2014   BILITOT 0.9 04/08/2014    Studies/Results: No results found.  Medications:  I have reviewed the patient's current medications. Scheduled Medications: . antiseptic oral rinse  7 mL Mouth Rinse q12n4p  . apixaban  5 mg Oral BID  . aspirin EC  81 mg Oral Daily  . atorvastatin  40 mg Oral q1800  . chlorhexidine  15 mL Mouth Rinse BID  . cloNIDine  0.1 mg Transdermal Weekly  . escitalopram  5 mg Oral Daily  . free water  100 mL Per Tube 5 X Daily  . insulin aspart  0-9 Units Subcutaneous 6 times per day  . lisinopril  5 mg Oral Daily  . loratadine  10 mg Oral Daily  . megestrol  400 mg Oral BID  . metoprolol tartrate  12.5 mg Per Tube BID  . multivitamin  5 mL Per Tube Daily   PRN Medications: acetaminophen, acetaminophen, diphenhydrAMINE, ondansetron **OR** ondansetron (ZOFRAN) IV, polyvinyl alcohol, RESOURCE THICKENUP CLEAR, senna-docusate, sorbitol, traZODone  Assessment/Plan: Principal Problem:   Cardioembolic stroke left MCA   1. Functional deficits secondary to left frontal infarct felt to be embolic secondary to atrial fibrillation2. DVT Prophylaxis/Anticoagulation:back on  Eliquis. Monitor for any bleeding episodes  3. Pain Management: Tylenol as needed  4. Dysphagia.[name] Dysphagia 1 nectar liquids. Minimal intake due to cognition 5. Neuropsych: This patient is not capable of making decisions on her own behalf. -continue low dose trazodone for sleep/restlessness 6. Skin/Wound Care: Routine skin checks 7. Fluids/Electrolytes/Nutrition: Strict I and O follow-up BMET 8. Atrial fibrillation/hypertension. Presently on Eliquis and low-dose Lopressor. Follow-up cardiology services. Cardiac rate control. Patient on lisinopril 5 mg daily and hydrochlorothiazide 25 mg daily prior to admission and will resume as tolerated 9. Mood/depression. Lexapro 5 mg  daily. Provide emotional support 10. Hyperlipidemia. Lipitor 11. Diabetes mellitus with peripheral neuropathy. Hemoglobin A1c 6.0.  -sugars better with initiation of TF 12. Acute on chronic diastolic congestive heart failure. Monitor for any signs of fluid overload, D/C IVF, Free H2o Flushes 13. Tobacco abuse. Counseling 14. 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, MD 04/25/2014, 8:26 AM

## 2014-04-26 ENCOUNTER — Inpatient Hospital Stay (HOSPITAL_COMMUNITY): Payer: Self-pay

## 2014-04-26 LAB — BASIC METABOLIC PANEL
Anion gap: 5 (ref 5–15)
BUN: 25 mg/dL — ABNORMAL HIGH (ref 6–23)
CALCIUM: 8 mg/dL — AB (ref 8.4–10.5)
CO2: 27 mmol/L (ref 19–32)
CREATININE: 0.97 mg/dL (ref 0.50–1.10)
Chloride: 108 mmol/L (ref 96–112)
GFR calc non Af Amer: 58 mL/min — ABNORMAL LOW (ref >90)
GFR, EST AFRICAN AMERICAN: 68 mL/min — AB (ref >90)
Glucose, Bld: 138 mg/dL — ABNORMAL HIGH (ref 70–99)
Potassium: 4.5 mmol/L (ref 3.5–5.1)
SODIUM: 140 mmol/L (ref 135–145)

## 2014-04-26 LAB — GLUCOSE, CAPILLARY
GLUCOSE-CAPILLARY: 116 mg/dL — AB (ref 70–99)
GLUCOSE-CAPILLARY: 133 mg/dL — AB (ref 70–99)
Glucose-Capillary: 114 mg/dL — ABNORMAL HIGH (ref 70–99)
Glucose-Capillary: 124 mg/dL — ABNORMAL HIGH (ref 70–99)
Glucose-Capillary: 132 mg/dL — ABNORMAL HIGH (ref 70–99)
Glucose-Capillary: 137 mg/dL — ABNORMAL HIGH (ref 70–99)

## 2014-04-26 NOTE — Progress Notes (Signed)
Patient has appointment for Fabrazyme at [facility] for 2/9 at 0830. This appointment can be seen as scheduled in chart review portion of EPIC.   Please see [name], RPh's note from 04/22/2014 regarding information on contacts and overall plan for this therapy.    Rebecca [name] D. Rebecca [name], PharmD, BCPS Clinical Pharmacist 04/26/2014 11:53 AM

## 2014-04-27 DIAGNOSIS — Z431 Encounter for attention to gastrostomy: Secondary | ICD-10-CM

## 2014-04-27 DIAGNOSIS — Z7189 Other specified counseling: Secondary | ICD-10-CM | POA: Insufficient documentation

## 2014-04-27 LAB — GLUCOSE, CAPILLARY
GLUCOSE-CAPILLARY: 109 mg/dL — AB (ref 70–99)
Glucose-Capillary: 132 mg/dL — ABNORMAL HIGH (ref 70–99)

## 2014-04-27 MED ORDER — SODIUM CHLORIDE 0.9 % IV SOLN
1.0000 mg/kg | Freq: Once | INTRAVENOUS | Status: AC
  Administered 2014-04-28: 65 mg via INTRAVENOUS
  Filled 2014-04-27: qty 13

## 2014-04-27 MED ORDER — ACETAMINOPHEN 500 MG PO TABS
500.0000 mg | ORAL_TABLET | Freq: Once | ORAL | Status: AC
  Administered 2014-04-28: 500 mg via ORAL
  Filled 2014-04-27: qty 1

## 2014-04-27 NOTE — Plan of Care (Signed)
Problem: RH KNOWLEDGE DEFICIT Goal: RH STG INCREASE KNOWLEDGE OF HYPERTENSION Patient's son able to verbalize signs/symptoms of hypo/hypertension, understands treatments and medication prescribed by MD with minimal assistance.  Outcome: Not Progressing No education provided to son

## 2014-04-27 NOTE — Progress Notes (Signed)
Social Work Patient ID: Rebecca [name], female   DOB: [birth year], 69 y.o.   MRN: [id number]   CSW continues to reach out to SNFs in our community to see if they will accept pt with the every other week Fabrazyme infusion.  The drug manufacturer has a patient assistance program and it is very likely pt will qualify for free Fabrazyme and then the company would send someone to inservice the SNF on how to administer the medication or pt could receive medication at [facility], as usual.  CSW has called the [employer] and learned the process for this.   CSW called Dr. Hadley Pen office to enlist their help with a letter of necessity.  CSW has updated Kandis Mannan, Conservation officer, nature of the situation and he is working with CSW to explore different options.  Pt is receiving her Fabrazyme treatment here in the hospital this morning.  There are a few potential SNFs that feel they can work around this barrier and CSW will call them to discuss the above further.  CSW to update son on the above.

## 2014-04-27 NOTE — Plan of Care (Signed)
Problem: RH BLADDER ELIMINATION Goal: RH STG MANAGE BLADDER WITH ASSISTANCE STG Manage Bladder With Assistance. Min A  Outcome: Not Progressing Wears briefs and is incontinent  Problem: RH SAFETY Goal: RH STG ADHERE TO SAFETY PRECAUTIONS W/ASSISTANCE/DEVICE STG Adhere to Safety Precautions With Assistance/Device. Supervision  Outcome: Progressing Bed alarm on, patient will try to get out of bed unassisted

## 2014-04-27 NOTE — Progress Notes (Signed)
Rebecca [name] is a 69 y.o. female 1945-12-09 [id number]  Subjective:  Per nephrology Fabrazyme ok for Monday 2/8   ROS- cannot obtain due to aphasia  Objective: Vital signs in last 24 hours: Temp:  [98.4 F (36.9 C)-99.3 F (37.4 C)] 98.4 F (36.9 C) (02/08 0413) Pulse Rate:  [63-82] 63 (02/08 0413) Resp:  [16-18] 16 (02/08 0413) BP: (112-116)/(72-78) 116/73 mmHg (02/08 0413) SpO2:  [100 %] 100 % (02/08 0413) Weight:  [66 kg (145 lb 8.1 oz)] 66 kg (145 lb 8.1 oz) (02/08 0413) Weight change: 0.138 kg (4.9 oz) Last BM Date: 04/26/14  Intake/Output from previous day: 02/07 0701 - 02/08 0700 In: 2230 [P.O.:30; NG/GT:2200] Out: -   Physical Exam General: No apparent distress   Follow verbal commands but severe exp  Aphasia, no phonation Lungs: Normal effort. Lungs clear to auscultation anterior, no crackles or wheezes. Cardiovascular: Regular rate and rhythm, no edema Neurological: moves all ext, remains severely aphasic (exp >>recept) Wounds: Gastrostomy site CDI.      Ext no edema Lab Results: BMET    Component Value Date/Time   NA 140 04/26/2014 0552   K 4.5 04/26/2014 0552   CL 108 04/26/2014 0552   CO2 27 04/26/2014 0552   GLUCOSE 138* 04/26/2014 0552   BUN 25* 04/26/2014 0552   CREATININE 0.97 04/26/2014 0552   CALCIUM 8.0* 04/26/2014 0552   GFRNONAA 58* 04/26/2014 0552   GFRAA 68* 04/26/2014 0552   CBC    Component Value Date/Time   WBC 3.6* 04/25/2014 0548   RBC 4.05 04/25/2014 0548   HGB 11.2* 04/25/2014 0548   HCT 33.0* 04/25/2014 0548   PLT 219 04/25/2014 0548   MCV 81.5 04/25/2014 0548   MCH 27.7 04/25/2014 0548   MCHC 33.9 04/25/2014 0548   RDW 15.4 04/25/2014 0548   LYMPHSABS 1.3 04/08/2014 0730   MONOABS 0.6 04/08/2014 0730   EOSABS 0.0 04/08/2014 0730   BASOSABS 0.0 04/08/2014 0730   CBG's (last 3):    Recent Labs  04/26/14 1959 04/26/14 2353 04/27/14 0418  GLUCAP 133* 124* 109*   LFT's Lab Results  Component Value Date    ALT 19 04/08/2014   AST 35 04/08/2014   ALKPHOS 37* 04/08/2014   BILITOT 0.9 04/08/2014    Studies/Results: No results found.  Medications:  I have reviewed the patient's current medications. Scheduled Medications: . antiseptic oral rinse  7 mL Mouth Rinse q12n4p  . apixaban  5 mg Oral BID  . aspirin EC  81 mg Oral Daily  . atorvastatin  40 mg Oral q1800  . chlorhexidine  15 mL Mouth Rinse BID  . cloNIDine  0.1 mg Transdermal Weekly  . escitalopram  5 mg Oral Daily  . free water  100 mL Per Tube 5 X Daily  . insulin aspart  0-9 Units Subcutaneous 6 times per day  . lisinopril  5 mg Oral Daily  . loratadine  10 mg Oral Daily  . megestrol  400 mg Oral BID  . metoprolol tartrate  12.5 mg Per Tube BID  . multivitamin  5 mL Per Tube Daily   PRN Medications: acetaminophen, acetaminophen, diphenhydrAMINE, ondansetron **OR** ondansetron (ZOFRAN) IV, polyvinyl alcohol, RESOURCE THICKENUP CLEAR, senna-docusate, sorbitol, traZODone  Assessment/Plan: Principal Problem:   Cardioembolic stroke left MCA Overdue for Fabrazyme infusion for Fabry's disease- needs treatment prior to SNF transfer   1. Functional deficits secondary to left frontal infarct felt to be embolic secondary to atrial fibrillation2. DVT Prophylaxis/Anticoagulation:back on  Eliquis.  Monitor for any bleeding episodes 3. Pain Management: Tylenol as needed  4. Dysphagia.[name] Dysphagia 1 nectar liquids. Minimal intake due to cognition 5. Neuropsych: This patient is not capable of making decisions on her own behalf. -continue low dose trazodone for sleep/restlessness 6. Skin/Wound Care: Routine skin checks 7. Fluids/Electrolytes/Nutrition: Strict I and O follow-up BMET 8. Atrial fibrillation/hypertension. Presently on Eliquis and low-dose Lopressor. Follow-up cardiology services. Cardiac rate control. Patient on lisinopril 5 mg daily and hydrochlorothiazide 25 mg daily prior to admission and will resume as  tolerated 9. Mood/depression. Lexapro 5 mg daily. Provide emotional support 10. Hyperlipidemia. Lipitor 11. Diabetes mellitus with peripheral neuropathy. Hemoglobin A1c 6.0.  -sugars better with initiation of TF 12. Acute on chronic diastolic congestive heart failure. Monitor for any signs of fluid overload, D/C IVF, Free H2o Flushes 13. Tobacco abuse. Counseling 14. 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, MD 04/27/2014, 7:47 AM

## 2014-04-28 ENCOUNTER — Encounter (HOSPITAL_COMMUNITY)
Admission: RE | Admit: 2014-04-28 | Payer: Medicare Other | Source: Ambulatory Visit | Attending: Nephrology | Admitting: Nephrology

## 2014-04-28 ENCOUNTER — Encounter (HOSPITAL_COMMUNITY): Admission: RE | Admit: 2014-04-28 | Payer: Medicare Other | Source: Ambulatory Visit

## 2014-04-28 LAB — GLUCOSE, CAPILLARY
GLUCOSE-CAPILLARY: 112 mg/dL — AB (ref 70–99)
GLUCOSE-CAPILLARY: 125 mg/dL — AB (ref 70–99)
GLUCOSE-CAPILLARY: 167 mg/dL — AB (ref 70–99)
GLUCOSE-CAPILLARY: 130 mg/dL — AB (ref 70–99)
Glucose-Capillary: 130 mg/dL — ABNORMAL HIGH (ref 70–99)
Glucose-Capillary: 137 mg/dL — ABNORMAL HIGH (ref 70–99)
Glucose-Capillary: 92 mg/dL (ref 70–99)
Glucose-Capillary: 95 mg/dL (ref 70–99)

## 2014-04-28 LAB — CBC
HEMATOCRIT: 34.4 % — AB (ref 36.0–46.0)
HEMOGLOBIN: 11.4 g/dL — AB (ref 12.0–15.0)
MCH: 27.7 pg (ref 26.0–34.0)
MCHC: 33.1 g/dL (ref 30.0–36.0)
MCV: 83.7 fL (ref 78.0–100.0)
Platelets: 250 10*3/uL (ref 150–400)
RBC: 4.11 MIL/uL (ref 3.87–5.11)
RDW: 15.8 % — ABNORMAL HIGH (ref 11.5–15.5)
WBC: 4.5 10*3/uL (ref 4.0–10.5)

## 2014-04-28 MED ORDER — SODIUM CHLORIDE 0.9 % IV SOLN
INTRAVENOUS | Status: DC
Start: 2014-04-28 — End: 2014-05-01
  Administered 2014-04-28: 08:00:00 via INTRAVENOUS

## 2014-04-28 MED ORDER — JEVITY 1.2 CAL PO LIQD
340.0000 mL | Freq: Four times a day (QID) | ORAL | Status: DC
  Administered 2014-04-28 – 2014-05-01 (×9): 340 mL
  Filled 2014-04-28 (×19): qty 474

## 2014-04-28 NOTE — Progress Notes (Addendum)
NUTRITION FOLLOW UP/CONSULT  INTERVENTION: Stop current infusion of TF for anticipation of bolus feeds.  Initiate bolus tube feeding: At 1800, start bolus feeds of Jevity 1.2 formula via PEG at 150 ml and increase by 100 ml every 6 hours to goal of 340 ml 4 times daily to provide 1632 kcal, 75 grams of protein, and 1102 ml of free water.    Continue free water flushes of 100 ml 5 times daily.   Will continue to monitor.  NUTRITION DIAGNOSIS: Inadequate oral intake related to dysphagia and poor appetite as evidenced by <25% meal completion.; ongoing  Goal: Pt to meet >/= 90% of their estimated nutrition needs; met  Monitor:  Bolus TF initiation/tolerance, PO intake, weight trend, labs  69 y.o. female  Admitting Dx: Cardioembolic stroke  ASSESSMENT: 69 y.o. right handed female with history of Fabrys disease, diastolic congestive heart failure, hypertension, atrial fibrillation maintained on Xarelto and low-dose aspirin with question of medical compliance, tobacco abuse, diabetes mellitus and peripheral neuropathy past CVA January 2015 and residual expressive aphasia. Presented 04/01/2014 with worsening aphasia and right-sided weakness as well as respiratory compromise.  PROCEDURE:(2/1): LAPAROSCOPIC GASTROSTOMY TUBE PLACEMENT  Pt is currently on dysphagia 1 with nectar thick liquids. Meal completion varies from 0-40%. Will continue to provide 100% of nutrition needs via TF as po intake is inadequate.  RD consulted for transition of bolus tube feedings. Pt is currently tolerating her continuous feeds.  Recommend stopping continue TF infusion to allow for gastric emptying prior to initiation of bolus feeds. Currently pt not to be transferred to SNF due to Fabrazyme.  Will continue to monitor.  Labs and medications reviewed.  Height: Ht Readings from Last 1 Encounters:  04/02/14 _0  (1.626 m)    Weight: Wt Readings from Last 1 Encounters:  04/28/14 146 lb 6.2 oz (66.4 kg)   04/12/14 137 lbs  Body mass index is 25.11 kg/(m^2).  Re-Estimated Nutritional Needs: Kcal: 1630-1850 Protein: 75-85 grams Fluid: 1.6-1.8.5 L/day  Skin: Incision on abdomen  Diet Order: DIET - DYS 1, nectar-thick   Intake/Output Summary (Last 24 hours) at 04/28/14 0941 Last data filed at 04/28/14 7616  Gross per 24 hour  Intake     20 ml  Output      0 ml  Net     20 ml    Last BM: 2/8  Labs:   Recent Labs Lab 04/26/14 0552  NA 140  K 4.5  CL 108  CO2 27  BUN 25*  CREATININE 0.97  CALCIUM 8.0*  GLUCOSE 138*    CBG (last 3)   Recent Labs  04/26/14 2353 04/27/14 0418 04/27/14 0807  GLUCAP 124* 109* 132*    Scheduled Meds: . agalsidase beta (FABRAZYME - PT WT 35.1-70 KG) IV  1 mg/kg Intravenous Once  . antiseptic oral rinse  7 mL Mouth Rinse q12n4p  . apixaban  5 mg Oral BID  . aspirin EC  81 mg Oral Daily  . atorvastatin  40 mg Oral q1800  . chlorhexidine  15 mL Mouth Rinse BID  . cloNIDine  0.1 mg Transdermal Weekly  . escitalopram  5 mg Oral Daily  . free water  100 mL Per Tube 5 X Daily  . insulin aspart  0-9 Units Subcutaneous 6 times per day  . lisinopril  5 mg Oral Daily  . loratadine  10 mg Oral Daily  . megestrol  400 mg Oral BID  . metoprolol tartrate  12.5 mg Per Tube BID  .  multivitamin  5 mL Per Tube Daily    Continuous Infusions: . sodium chloride Stopped (04/28/14 0802)  . feeding supplement (JEVITY 1.2 CAL) 1,000 mL (04/28/14 0504)    Past Medical History  Diagnosis Date  . Hypertension   . 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 Expressive aphasia 03/23/2013  . Atrial fibrillation 05/01/2013  . Tobacco abuse 05/01/2013  . Diabetes mellitus without complication   . Depression   . Stroke     2015    Past Surgical History  Procedure Laterality Date  . Tee without cardioversion N/A 03/26/2013    Procedure: TRANSESOPHAGEAL ECHOCARDIOGRAM (TEE);  Surgeon: Sanda Klein, MD;  Location: [facility]COPY;  Service: Cardiovascular;  Laterality:  N/A;  . Cesarean section      x3  . Hip replacement Right 1990's  . Hip replacemet Left 1990's  . Laparoscopic gastrostomy N/A 04/20/2014    Procedure: LAPAROSCOPIC GASTROSTOMY TUBE PLACEMENT;  Surgeon: Ralene Ok, MD;  Location: [facility];  Service: General;  Laterality: N/A;   Kallie Locks, MS, RD, LDN Pager # [phone] After hours/ weekend pager # [phone]

## 2014-04-28 NOTE — Progress Notes (Signed)
Rebecca [name] is a 69 y.o. female June 08, 1945 [id number]  Subjective:  Per Pharmacy notes, Fabrazyme infusion today  RN from [facility] short stay here to infuse  ROS- cannot obtain due to aphasia  Objective: Vital signs in last 24 hours: Temp:  [98.1 F (36.7 C)-98.5 F (36.9 C)] 98.1 F (36.7 C) (02/09 0447) Pulse Rate:  [63-74] 74 (02/09 0447) Resp:  [18-19] 19 (02/09 0447) BP: (117-125)/(70-91) 120/85 mmHg (02/09 0447) SpO2:  [100 %] 100 % (02/09 0447) Weight:  [66.4 kg (146 lb 6.2 oz)] 66.4 kg (146 lb 6.2 oz) (02/09 0447) Weight change: 0.4 kg (14.1 oz) Last BM Date: 04/27/14  Intake/Output from previous day: 02/08 0701 - 02/09 0700 In: 20 [P.O.:20] Out: -   Physical Exam General: No apparent distress   Follow verbal commands but severe exp  Aphasia, no phonation Lungs: Normal effort. Lungs clear to auscultation anterior, no crackles or wheezes. Cardiovascular: Regular rate and rhythm, no edema Neurological: moves all ext, remains severely aphasic (exp >>recept) Wounds: Gastrostomy site CDI.      Ext no edema Lab Results: BMET    Component Value Date/Time   NA 140 04/26/2014 0552   K 4.5 04/26/2014 0552   CL 108 04/26/2014 0552   CO2 27 04/26/2014 0552   GLUCOSE 138* 04/26/2014 0552   BUN 25* 04/26/2014 0552   CREATININE 0.97 04/26/2014 0552   CALCIUM 8.0* 04/26/2014 0552   GFRNONAA 58* 04/26/2014 0552   GFRAA 68* 04/26/2014 0552   CBC    Component Value Date/Time   WBC 3.6* 04/25/2014 0548   RBC 4.05 04/25/2014 0548   HGB 11.2* 04/25/2014 0548   HCT 33.0* 04/25/2014 0548   PLT 219 04/25/2014 0548   MCV 81.5 04/25/2014 0548   MCH 27.7 04/25/2014 0548   MCHC 33.9 04/25/2014 0548   RDW 15.4 04/25/2014 0548   LYMPHSABS 1.3 04/08/2014 0730   MONOABS 0.6 04/08/2014 0730   EOSABS 0.0 04/08/2014 0730   BASOSABS 0.0 04/08/2014 0730   CBG's (last 3):    Recent Labs  04/26/14 2353 04/27/14 0418 04/27/14 0807  GLUCAP 124* 109* 132*   LFT's Lab  Results  Component Value Date   ALT 19 04/08/2014   AST 35 04/08/2014   ALKPHOS 37* 04/08/2014   BILITOT 0.9 04/08/2014    Studies/Results: No results found.  Medications:  I have reviewed the patient's current medications. Scheduled Medications: . agalsidase beta (FABRAZYME - PT WT 35.1-70 KG) IV  1 mg/kg Intravenous Once  . antiseptic oral rinse  7 mL Mouth Rinse q12n4p  . apixaban  5 mg Oral BID  . aspirin EC  81 mg Oral Daily  . atorvastatin  40 mg Oral q1800  . chlorhexidine  15 mL Mouth Rinse BID  . cloNIDine  0.1 mg Transdermal Weekly  . escitalopram  5 mg Oral Daily  . free water  100 mL Per Tube 5 X Daily  . insulin aspart  0-9 Units Subcutaneous 6 times per day  . lisinopril  5 mg Oral Daily  . loratadine  10 mg Oral Daily  . megestrol  400 mg Oral BID  . metoprolol tartrate  12.5 mg Per Tube BID  . multivitamin  5 mL Per Tube Daily   PRN Medications: acetaminophen, acetaminophen, diphenhydrAMINE, ondansetron **OR** ondansetron (ZOFRAN) IV, polyvinyl alcohol, RESOURCE THICKENUP CLEAR, senna-docusate, sorbitol, traZODone  Assessment/Plan: Principal Problem:   Cardioembolic stroke left MCA Overdue for Fabrazyme infusion for Fabry's disease- needs treatment prior to SNF transfer  1. Functional deficits secondary to left frontal infarct felt to be embolic secondary to atrial fibrillation2. DVT Prophylaxis/Anticoagulation:back on  Eliquis. Monitor for any bleeding episodes 3. Pain Management: Tylenol as needed  4. Dysphagia.[name] Dysphagia 1 nectar liquids. Minimal intake due to cognition 5. Neuropsych: This patient is not capable of making decisions on her own behalf. -continue low dose trazodone for sleep/restlessness 6. Skin/Wound Care: Routine skin checks 7. Fluids/Electrolytes/Nutrition: Strict I and O follow-up BMET 8. Atrial fibrillation/hypertension. Presently on Eliquis and low-dose Lopressor. Follow-up cardiology services. Cardiac rate control.  Patient on lisinopril 5 mg daily and hydrochlorothiazide 25 mg daily prior to admission and will resume as tolerated 9. Mood/depression. Lexapro 5 mg daily. Provide emotional support 10. Hyperlipidemia. Lipitor 11. Diabetes mellitus with peripheral neuropathy. Hemoglobin A1c 6.0.  -sugars better with initiation of TF 12. Acute on chronic diastolic congestive heart failure. Monitor for any signs of fluid overload, D/C IVF, Free H2o Flushes 13. Tobacco abuse. Counseling 14. Poor intake cognitive based,minimal caloric and fluid intake- Change to bolus feeds       15. Fabry's disease  Fabrazyme today   Length of stay, days: 21   [name], MD 04/28/2014, 7:44 AM

## 2014-04-28 NOTE — Progress Notes (Signed)
Fabrazyme infusion complete at 1220. Tolerated infusion well. No signs of adverse reaction noted.

## 2014-04-29 LAB — GLUCOSE, CAPILLARY
GLUCOSE-CAPILLARY: 113 mg/dL — AB (ref 70–99)
GLUCOSE-CAPILLARY: 59 mg/dL — AB (ref 70–99)
Glucose-Capillary: 125 mg/dL — ABNORMAL HIGH (ref 70–99)
Glucose-Capillary: 76 mg/dL (ref 70–99)
Glucose-Capillary: 139 mg/dL — ABNORMAL HIGH (ref 70–99)
Glucose-Capillary: 152 mg/dL — ABNORMAL HIGH (ref 70–99)
Glucose-Capillary: 93 mg/dL (ref 70–99)
Glucose-Capillary: 131 mg/dL — ABNORMAL HIGH (ref 70–99)
Glucose-Capillary: 167 mg/dL — ABNORMAL HIGH (ref 70–99)

## 2014-04-29 MED ORDER — GLUCOSE 40 % PO GEL
ORAL | Status: AC
  Administered 2014-04-29: 37.5 g
  Filled 2014-04-29: qty 1

## 2014-04-29 NOTE — Progress Notes (Addendum)
Rebecca [name] is a 69 y.o. female Jul 30, 1945 [id number]  Subjective Fabrazyme completed yesterday   tol TF bolus thus far ROS- cannot obtain due to aphasia  Objective: Vital signs in last 24 hours: Temp:  [98.2 F (36.8 C)-99 F (37.2 C)] 98.2 F (36.8 C) (02/10 0400) Pulse Rate:  [78-94] 80 (02/10 0400) Resp:  [17-20] 18 (02/10 0400) BP: (111-139)/(68-91) 121/69 mmHg (02/10 0400) SpO2:  [98 %-100 %] 98 % (02/10 0400) Weight:  [66.2 kg (145 lb 15.1 oz)] 66.2 kg (145 lb 15.1 oz) (02/10 0400) Weight change: -0.2 kg (-7.1 oz) Last BM Date: 04/27/14  Intake/Output from previous day: 02/09 0701 - 02/10 0700 In: 510 [NG/GT:510] Out: -   Physical Exam General: No apparent distress   Follow verbal commands but severe exp  Aphasia, no phonation Lungs: Normal effort. Lungs clear to auscultation anterior, no crackles or wheezes. Cardiovascular: Regular rate and rhythm, no edema Neurological: moves all ext, remains severely aphasic (exp >>recept) Wounds: Gastrostomy site CDI.      Ext no edema Lab Results: BMET    Component Value Date/Time   NA 140 04/26/2014 0552   K 4.5 04/26/2014 0552   CL 108 04/26/2014 0552   CO2 27 04/26/2014 0552   GLUCOSE 138* 04/26/2014 0552   BUN 25* 04/26/2014 0552   CREATININE 0.97 04/26/2014 0552   CALCIUM 8.0* 04/26/2014 0552   GFRNONAA 58* 04/26/2014 0552   GFRAA 68* 04/26/2014 0552   CBC    Component Value Date/Time   WBC 4.5 04/28/2014 0930   RBC 4.11 04/28/2014 0930   HGB 11.4* 04/28/2014 0930   HCT 34.4* 04/28/2014 0930   PLT 250 04/28/2014 0930   MCV 83.7 04/28/2014 0930   MCH 27.7 04/28/2014 0930   MCHC 33.1 04/28/2014 0930   RDW 15.8* 04/28/2014 0930   LYMPHSABS 1.3 04/08/2014 0730   MONOABS 0.6 04/08/2014 0730   EOSABS 0.0 04/08/2014 0730   BASOSABS 0.0 04/08/2014 0730   CBG's (last 3):    Recent Labs  04/28/14 2349 04/29/14 0347 04/29/14 0754  GLUCAP 95 125* 76   LFT's Lab Results  Component Value Date    ALT 19 04/08/2014   AST 35 04/08/2014   ALKPHOS 37* 04/08/2014   BILITOT 0.9 04/08/2014    Studies/Results: No results found.  Medications:  I have reviewed the patient's current medications. Scheduled Medications: . antiseptic oral rinse  7 mL Mouth Rinse q12n4p  . apixaban  5 mg Oral BID  . aspirin EC  81 mg Oral Daily  . atorvastatin  40 mg Oral q1800  . chlorhexidine  15 mL Mouth Rinse BID  . cloNIDine  0.1 mg Transdermal Weekly  . escitalopram  5 mg Oral Daily  . feeding supplement (JEVITY 1.2 CAL)  340 mL Per Tube QID  . free water  100 mL Per Tube 5 X Daily  . insulin aspart  0-9 Units Subcutaneous 6 times per day  . lisinopril  5 mg Oral Daily  . loratadine  10 mg Oral Daily  . megestrol  400 mg Oral BID  . metoprolol tartrate  12.5 mg Per Tube BID  . multivitamin  5 mL Per Tube Daily   PRN Medications: acetaminophen, acetaminophen, diphenhydrAMINE, ondansetron **OR** ondansetron (ZOFRAN) IV, polyvinyl alcohol, RESOURCE THICKENUP CLEAR, senna-docusate, sorbitol, traZODone  Assessment/Plan: Principal Problem:   Cardioembolic stroke left MCA    1. Functional deficits secondary to left frontal infarct felt to be embolic secondary to atrial fibrillation2. DVT Prophylaxis/Anticoagulation:back on  Eliquis. Monitor for any bleeding episodes 3. Pain Management: Tylenol as needed  4. Dysphagia.[name] Dysphagia 1 nectar liquids. Minimal intake due to cognition 5. Neuropsych: This patient is not capable of making decisions on her own behalf. -continue low dose trazodone for sleep/restlessness 6. Skin/Wound Care: Routine skin checks 7. Fluids/Electrolytes/Nutrition: Strict I and O follow-up BMET 8. Atrial fibrillation/hypertension. Presently on Eliquis and low-dose Lopressor. Follow-up cardiology services. Cardiac rate control. Patient on lisinopril 5 mg daily and hydrochlorothiazide 25 mg daily prior to admission and will resume as tolerated 9. Mood/depression.  Lexapro 5 mg daily. Provide emotional support 10. Hyperlipidemia. Lipitor 11. Diabetes mellitus with peripheral neuropathy. Hemoglobin A1c 6.0.  -sugars better with initiation of TF 12. Acute on chronic diastolic congestive heart failure. Monitor for any signs of fluid overload, D/C IVF, Free H2o Flushes 13. Tobacco abuse. Counseling 14. 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

## 2014-04-30 LAB — GLUCOSE, CAPILLARY
GLUCOSE-CAPILLARY: 98 mg/dL (ref 70–99)
GLUCOSE-CAPILLARY: 169 mg/dL — AB (ref 70–99)
GLUCOSE-CAPILLARY: 78 mg/dL (ref 70–99)
Glucose-Capillary: 114 mg/dL — ABNORMAL HIGH (ref 70–99)
Glucose-Capillary: 171 mg/dL — ABNORMAL HIGH (ref 70–99)
Glucose-Capillary: 99 mg/dL (ref 70–99)

## 2014-04-30 NOTE — Progress Notes (Signed)
Social Work Patient ID: Rebecca [name], female   DOB: [birth year], 69 y.o.   MRN: [id number]   CSW spoke with pt's son to update him on situation and team conference discussion.  We await Dr. Arrie Aran to complete paperwork for pt to be reviewed for charitable drug program at the SNF.  Pt has bed offers pending the confirmation that pt is accepted into program.  CSW encouraged son to go look at facilities so we could be ready when paperwork is submitted.  He has not been well to tour facilities, so son may need to choose facility without touring.  CSW will continue to work with son, Dr. Hadley Pen office, and pharmaceutical company.

## 2014-04-30 NOTE — Patient Care Conference (Signed)
Inpatient RehabilitationTeam Conference and Plan of Care Update Date: 04/29/2014   Time: 10:30 AM    Patient Name: Rebecca [name]      Medical Record Number: [id number]  Date of Birth: Jun 24, 1945 Sex: Female         Room/Bed: 4W23C/4W23C-01 Payor Info: Payor: MEDICARE / Plan: MEDICARE PART A AND B / Product Type: *No Product type* /    Admitting Diagnosis: L FRONTAL CVA dysphagia  Admit Date/Time:  04/07/2014  4:53 PM Admission Comments: No comment available   Primary Diagnosis:  Cardioembolic stroke Principal Problem: Cardioembolic stroke  Patient Active Problem List   Diagnosis Date Noted  . PEG (percutaneous endoscopic gastrostomy) adjustment/replacement/removal   . Dysphagia S/P CVA (cerebrovascular accident) 04/22/2014  . 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 Cardioembolic stroke 98/92/1194  . Protein-calorie malnutrition, severe 04/05/2014  . Global aphasia   . Paroxysmal atrial fibrillation   . Hyperlipidemia   . Acute diastolic heart failure 17/40/8144  . Elevated troponin I level   . Essential hypertension   . Acute on chronic combined systolic and diastolic CHF (congestive heart failure)   . Pulmonary hypertension   . Chronic atrial fibrillation   . Diabetes type 2, controlled   . HLD (hyperlipidemia)   . Elevated troponin 04/01/2014  . Stroke 04/01/2014  . Aphasia 04/01/2014  . 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 Atrial fibrillation 05/01/2013  . Tobacco abuse 05/01/2013  . Expressive aphasia 03/23/2013  . Diabetes mellitus 03/23/2013  . Hypertension 03/23/2013  . Decreased calculated GFR 03/23/2013    Expected Discharge Date:  SNF  Team Members Present: Physician leading conference: Dr. Alysia Penna Social Worker Present: Alfonse Alpers, LCSW Nurse Present: Elliot Cousin, RN PT Present: Georjean Mode, Cecille Rubin, PT;Blair Hobble, PT OT Present: Meriel Pica, Jules Schick, OT SLP Present: Windell Moulding, SLP PPS  Coordinator present : Daiva Nakayama, RN, CRRN     Current Status/Progress Goal Weekly Team Focus  Medical   Fabrazyme administered, cont with aphasia  medically stble for SNF  Bolus feeds   Bowel/Bladder   incont at times; cont with timed toileting for bladder; cont of bowel with prompting  cont of bowel and bladder with cues/prompting  times toileting q 3 hrs during the day and before bed and q 4 during the night   Swallow/Nutrition/ Hydration             ADL's             Mobility             Communication             Safety/Cognition/ Behavioral Observations            Pain   n/a         Skin   PEG site intact; skin un remarkable  skin integrity to remain unremarkable  assess skin q shift    Rehab Goals Patient on target to meet rehab goals: Yes Rehab Goals Revised: pt has met goals and awaits SNF bed *See Care Plan and progress notes for long and short-term goals.  Barriers to Discharge: variable participation    Possible Resolutions to Barriers:  SNF    Discharge Planning/Teaching Needs:  Pt will continue to need rehab after CIR.  Pt's son, Rebecca [name], is agreeable to SNF transfer.  None   Team Discussion:  Pt was discharged from therapies with the thought she was going to SNF and planned SNF feel through.  CSW continues to work  on SNF transfer for pt.  Pt has been converted over to bolus feeds and is tolerating them.  Revisions to Treatment Plan:  CSW continues to work on SNF transfer.  Pt's biggest barrier to SNF accepting pt is her need for every two week fabrazyme infusions and the expense of that.  CSW is working to have pt accepted into charitable drug program so this will not be a barrier to a SNF accepting pt.  Await Dr. Marval Regal to complete necessary paperwork for pt to be reviewed for the program.   Continued Need for Acute Rehabilitation Level of Care: The patient requires daily medical management by a physician with specialized training in physical medicine  and rehabilitation for the following conditions: Daily direction of a multidisciplinary physical rehabilitation program to ensure safe treatment while eliciting the highest outcome that is of practical value to the patient.: Yes Daily medical management of patient stability for increased activity during participation in an intensive rehabilitation regime.: Yes Daily analysis of laboratory values and/or radiology reports with any subsequent need for medication adjustment of medical intervention for : Neurological problems  Rebecca [name], Rebecca [name] 04/30/2014, 11:02 AM

## 2014-04-30 NOTE — Progress Notes (Signed)
Social Work Patient ID: Rebecca [name], female   DOB: April 06, 1945, 69 y.o.   MRN: [id number]   Pt/family have accepted bed offer from [facility] and pt will transfer there with her son transporting her tomorrow morning and copy of her chart to accompany her.  Updated d/c summary will need to be faxed to the facility and CSW, Dossie Der will do this.  RN should call report to SNF at [phone].  This CSW will f/u with Dr. Hadley Pen office to assure paperwork for pt assistance program is submitted to pharmaceutical company.  His office will also inform SNF of pt's next infusion.  CSW remains available to assist as needed.

## 2014-04-30 NOTE — Progress Notes (Signed)
Social Work Patient ID: Rebecca [name], female   DOB: Nov 05, 1945, 69 y.o.   MRN: [id number]    Silvestre Mesi Faithann Natal, LCSW Social Worker Signed  Patient Care Conference 04/30/2014 11:02 AM    Expand All Collapse All   Inpatient RehabilitationTeam Conference and Plan of Care Update Date: 04/29/2014   Time: 10:30 AM     Patient Name: Rebecca [name]       Medical Record Number: [id number]  Date of Birth: 08-31-1945 Sex: Female         Room/Bed: 4W23C/4W23C-01 Payor Info: Payor: MEDICARE / Plan: MEDICARE PART A AND B / Product Type: *No Product type* /    Admitting Diagnosis: L FRONTAL CVA dysphagia  Admit Date/Time:  04/07/2014  4:53 PM Admission Comments: No comment available   Primary Diagnosis:  Cardioembolic stroke Principal Problem: Cardioembolic stroke    Patient Active Problem List     Diagnosis  Date Noted   .  PEG (percutaneous endoscopic gastrostomy) adjustment/replacement/removal     .  Dysphagia S/P CVA (cerebrovascular accident)  04/22/2014   .  Apraxia following CVA (cerebrovascular accident)  04/09/2014   .  Cardioembolic stroke  70/64/3329   .  Protein-calorie malnutrition, severe  04/05/2014   .  Global aphasia     .  Paroxysmal atrial fibrillation     .  Hyperlipidemia     .  Acute diastolic heart failure  51/88/4166   .  Elevated troponin I level     .  Essential hypertension     .  Acute on chronic combined systolic and diastolic CHF (congestive heart failure)     .  Pulmonary hypertension     .  Chronic atrial fibrillation     .  Diabetes type 2, controlled     .  HLD (hyperlipidemia)     .  Elevated troponin  04/01/2014   .  Stroke  04/01/2014   .  Aphasia  04/01/2014   .  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  Atrial fibrillation  05/01/2013   .  Tobacco abuse  05/01/2013   .  Expressive aphasia  03/23/2013   .  Diabetes mellitus  03/23/2013   .  Hypertension  03/23/2013   .  Decreased calculated GFR  03/23/2013     Expected Discharge Date:   SNF  Team Members Present: Physician leading conference: Dr. Alysia Penna Social Worker Present: Alfonse Alpers, LCSW Nurse Present: Elliot Cousin, RN PT Present: Georjean Mode, Cecille Rubin, PT;Blair Hobble, PT OT Present: Meriel Pica, Jules Schick, OT SLP Present: Windell Moulding, SLP PPS Coordinator present : Daiva Nakayama, RN, CRRN        Current Status/Progress  Goal  Weekly Team Focus   Medical     Fabrazyme administered, cont with aphasia   medically stble for SNF  Bolus feeds   Bowel/Bladder     incont at times; cont with timed toileting for bladder; cont of bowel with prompting  cont of bowel and bladder with cues/prompting  times toileting q 3 hrs during the day and before bed and q 4 during the night   Swallow/Nutrition/ Hydration               ADL's               Mobility               Communication               Safety/Cognition/  Behavioral Observations              Pain     n/a         Skin     PEG site intact; skin un remarkable   skin integrity to remain unremarkable   assess skin q shift    Rehab Goals Patient on target to meet rehab goals: Yes Rehab Goals Revised: pt has met goals and awaits SNF bed *See Care Plan and progress notes for long and short-term goals.    Barriers to Discharge:  variable participation     Possible Resolutions to Barriers:   SNF     Discharge Planning/Teaching Needs:   Pt will continue to need rehab after CIR.  Pt's son, [name], is agreeable to SNF transfer.  None    Team Discussion:    Pt was discharged from therapies with the thought she was going to SNF and planned SNF feel through.  CSW continues to work on SNF transfer for pt.  Pt has been converted over to bolus feeds and is tolerating them.   Revisions to Treatment Plan:    CSW continues to work on SNF transfer.  Pt's biggest barrier to SNF accepting pt is her need for every two week fabrazyme infusions and the expense of that.  CSW is working to have pt  accepted into charitable drug program so this will not be a barrier to a SNF accepting pt.  Await Dr. Marval Regal to complete necessary paperwork for pt to be reviewed for the program.    Continued Need for Acute Rehabilitation Level of Care: The patient requires daily medical management by a physician with specialized training in physical medicine and rehabilitation for the following conditions: Daily direction of a multidisciplinary physical rehabilitation program to ensure safe treatment while eliciting the highest outcome that is of practical value to the patient.: Yes Daily medical management of patient stability for increased activity during participation in an intensive rehabilitation regime.: Yes Daily analysis of laboratory values and/or radiology reports with any subsequent need for medication adjustment of medical intervention for : Neurological problems  Seyon Strader, Silvestre Mesi 04/30/2014, 11:02 AM

## 2014-04-30 NOTE — Progress Notes (Addendum)
Rebecca [name] is a 69 y.o. female 1945-11-27 [id number]  Subjective    tol TF bolus thus far- at goal volume ROS- cannot obtain due to aphasia  Objective: Vital signs in last 24 hours: Temp:  [98.6 F (37 C)-98.9 F (37.2 C)] 98.9 F (37.2 C) (02/11 0651) Pulse Rate:  [72-83] 83 (02/11 0651) Resp:  [18] 18 (02/11 0651) BP: (100-114)/(60-84) 113/80 mmHg (02/11 0651) SpO2:  [100 %] 100 % (02/11 0651) Weight:  [68.6 kg (151 lb 3.8 oz)] 68.6 kg (151 lb 3.8 oz) (02/11 0651) Weight change: 2.4 kg (5 lb 4.7 oz) Last BM Date: 04/27/14  Intake/Output from previous day: 02/10 0701 - 02/11 0700 In: 1460 [P.O.:240; NG/GT:1220] Out: -   Physical Exam General: No apparent distress   Follow verbal commands but severe exp  Aphasia, no phonation Lungs: Normal effort. Lungs clear to auscultation anterior, no crackles or wheezes. Cardiovascular: Regular rate and rhythm, no edema Neurological: moves all ext, remains severely aphasic (exp >>recept) Wounds: Gastrostomy site CDI.      Ext no edema Lab Results: BMET    Component Value Date/Time   NA 140 04/26/2014 0552   K 4.5 04/26/2014 0552   CL 108 04/26/2014 0552   CO2 27 04/26/2014 0552   GLUCOSE 138* 04/26/2014 0552   BUN 25* 04/26/2014 0552   CREATININE 0.97 04/26/2014 0552   CALCIUM 8.0* 04/26/2014 0552   GFRNONAA 58* 04/26/2014 0552   GFRAA 68* 04/26/2014 0552   CBC    Component Value Date/Time   WBC 4.5 04/28/2014 0930   RBC 4.11 04/28/2014 0930   HGB 11.4* 04/28/2014 0930   HCT 34.4* 04/28/2014 0930   PLT 250 04/28/2014 0930   MCV 83.7 04/28/2014 0930   MCH 27.7 04/28/2014 0930   MCHC 33.1 04/28/2014 0930   RDW 15.8* 04/28/2014 0930   LYMPHSABS 1.3 04/08/2014 0730   MONOABS 0.6 04/08/2014 0730   EOSABS 0.0 04/08/2014 0730   BASOSABS 0.0 04/08/2014 0730   CBG's (last 3):    Recent Labs  04/30/14 0023 04/30/14 0354 04/30/14 0739  GLUCAP 114* 99 98   LFT's Lab Results  Component Value Date   ALT  19 04/08/2014   AST 35 04/08/2014   ALKPHOS 37* 04/08/2014   BILITOT 0.9 04/08/2014    Studies/Results: No results found.  Medications:  I have reviewed the patient's current medications. Scheduled Medications: . antiseptic oral rinse  7 mL Mouth Rinse q12n4p  . apixaban  5 mg Oral BID  . aspirin EC  81 mg Oral Daily  . atorvastatin  40 mg Oral q1800  . chlorhexidine  15 mL Mouth Rinse BID  . cloNIDine  0.1 mg Transdermal Weekly  . escitalopram  5 mg Oral Daily  . feeding supplement (JEVITY 1.2 CAL)  340 mL Per Tube QID  . free water  100 mL Per Tube 5 X Daily  . insulin aspart  0-9 Units Subcutaneous 6 times per day  . lisinopril  5 mg Oral Daily  . loratadine  10 mg Oral Daily  . megestrol  400 mg Oral BID  . metoprolol tartrate  12.5 mg Per Tube BID  . multivitamin  5 mL Per Tube Daily   PRN Medications: acetaminophen, acetaminophen, diphenhydrAMINE, ondansetron **OR** ondansetron (ZOFRAN) IV, polyvinyl alcohol, RESOURCE THICKENUP CLEAR, senna-docusate, sorbitol, traZODone  Assessment/Plan: Principal Problem:   Cardioembolic stroke left MCA    1. Functional deficits secondary to left frontal infarct felt to be embolic secondary to atrial fibrillation2. DVT  Prophylaxis/Anticoagulation:back on  Eliquis. Monitor for any bleeding episodes 3. Pain Management: Tylenol as needed  4. Dysphagia.[name] Dysphagia 1 nectar liquids. Minimal intake due to cognition 5. Neuropsych: This patient is not capable of making decisions on her own behalf. -continue low dose trazodone for sleep/restlessness 6. Skin/Wound Care: Routine skin checks 7. Fluids/Electrolytes/Nutrition: Strict I and O follow-up BMET 8. Atrial fibrillation/hypertension. Presently on Eliquis and low-dose Lopressor. Follow-up cardiology services. Cardiac rate control. Patient on lisinopril 5 mg daily and hydrochlorothiazide 25 mg daily prior to admission and will resume as tolerated 9. Mood/depression. Lexapro  5 mg daily. Provide emotional support 10. Hyperlipidemia. Lipitor 11. Diabetes mellitus with peripheral neuropathy. Hemoglobin A1c 6.0.  -sugars better with initiation of TF 12. Acute on chronic diastolic congestive heart failure. Monitor for any signs of fluid overload, D/C IVF, Free H2o Flushes 13. Tobacco abuse. Counseling 14. 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

## 2014-05-01 LAB — CBC
HCT: 34.4 % — ABNORMAL LOW (ref 36.0–46.0)
Hemoglobin: 12.1 g/dL (ref 12.0–15.0)
MCH: 28.3 pg (ref 26.0–34.0)
MCHC: 35.2 g/dL (ref 30.0–36.0)
MCV: 80.6 fL (ref 78.0–100.0)
Platelets: 237 10*3/uL (ref 150–400)
RBC: 4.27 MIL/uL (ref 3.87–5.11)
RDW: 15.7 % — ABNORMAL HIGH (ref 11.5–15.5)
WBC: 5.1 10*3/uL (ref 4.0–10.5)

## 2014-05-01 LAB — GLUCOSE, CAPILLARY
GLUCOSE-CAPILLARY: 144 mg/dL — AB (ref 70–99)
Glucose-Capillary: 91 mg/dL (ref 70–99)
Glucose-Capillary: 99 mg/dL (ref 70–99)

## 2014-05-01 NOTE — Progress Notes (Signed)
Pt prepared for d/c to SNF. IV d/c'd. Skin intact except as most recently charted. Vitals are stable. Report called to Tish at [facility] (receiving facility). Pt to be transported by family to the facility.  Peri Maris, MBA, BS, RN

## 2014-05-01 NOTE — Progress Notes (Signed)
Social Work Patient ID: Rebecca [name], female   DOB: Jun 11, 1945, 69 y.o.   MRN: [id number] Contacted son to inform facility would like pt to be there at 10;30 am.  He will be here around 10;00 will see then to give him the packet of paperwork for transfer to [facility].

## 2014-05-01 NOTE — Progress Notes (Addendum)
Rebecca [name] is a 69 y.o. female 06/17/1945 [id number]  Subjective   No issues overnite tol TF bolus thus far- at goal volume ROS- cannot obtain due to aphasia  Objective: Vital signs in last 24 hours: Temp:  [97.4 F (36.3 C)-99.7 F (37.6 C)] 99.7 F (37.6 C) (02/12 0421) Pulse Rate:  [81-103] 100 (02/12 0421) Resp:  [18-19] 18 (02/12 0421) BP: (90-118)/(60-83) 113/74 mmHg (02/12 0421) SpO2:  [99 %-100 %] 100 % (02/12 0421) Weight:  [65.772 kg (145 lb)] 65.772 kg (145 lb) (02/12 0415) Weight change: -2.828 kg (-6 lb 3.8 oz) Last BM Date: 05/01/14  Intake/Output from previous day: 02/11 0701 - 02/12 0700 In: 1430 [P.O.:300; NG/GT:1130] Out: 1 [Stool:1]  Physical Exam General: No apparent distress   Follow verbal commands but severe exp  Aphasia, no phonation Lungs: Normal effort. Lungs clear to auscultation anterior, no crackles or wheezes. Cardiovascular: Regular rate and rhythm, no edema Neurological: moves all ext, remains severely aphasic (exp >>recept) Wounds: Gastrostomy site CDI.      Ext no edema Lab Results: BMET    Component Value Date/Time   NA 140 04/26/2014 0552   K 4.5 04/26/2014 0552   CL 108 04/26/2014 0552   CO2 27 04/26/2014 0552   GLUCOSE 138* 04/26/2014 0552   BUN 25* 04/26/2014 0552   CREATININE 0.97 04/26/2014 0552   CALCIUM 8.0* 04/26/2014 0552   GFRNONAA 58* 04/26/2014 0552   GFRAA 68* 04/26/2014 0552   CBC    Component Value Date/Time   WBC 5.1 05/01/2014 0610   RBC 4.27 05/01/2014 0610   HGB 12.1 05/01/2014 0610   HCT 34.4* 05/01/2014 0610   PLT 237 05/01/2014 0610   MCV 80.6 05/01/2014 0610   MCH 28.3 05/01/2014 0610   MCHC 35.2 05/01/2014 0610   RDW 15.7* 05/01/2014 0610   LYMPHSABS 1.3 04/08/2014 0730   MONOABS 0.6 04/08/2014 0730   EOSABS 0.0 04/08/2014 0730   BASOSABS 0.0 04/08/2014 0730   CBG's (last 3):    Recent Labs  04/30/14 2010 05/01/14 0001 05/01/14 0358  GLUCAP 171* 144* 91   LFT's Lab  Results  Component Value Date   ALT 19 04/08/2014   AST 35 04/08/2014   ALKPHOS 37* 04/08/2014   BILITOT 0.9 04/08/2014    Studies/Results: No results found.  Medications:  I have reviewed the patient's current medications. Scheduled Medications: . antiseptic oral rinse  7 mL Mouth Rinse q12n4p  . apixaban  5 mg Oral BID  . aspirin EC  81 mg Oral Daily  . atorvastatin  40 mg Oral q1800  . chlorhexidine  15 mL Mouth Rinse BID  . cloNIDine  0.1 mg Transdermal Weekly  . escitalopram  5 mg Oral Daily  . feeding supplement (JEVITY 1.2 CAL)  340 mL Per Tube QID  . free water  100 mL Per Tube 5 X Daily  . insulin aspart  0-9 Units Subcutaneous 6 times per day  . lisinopril  5 mg Oral Daily  . loratadine  10 mg Oral Daily  . megestrol  400 mg Oral BID  . metoprolol tartrate  12.5 mg Per Tube BID  . multivitamin  5 mL Per Tube Daily   PRN Medications: acetaminophen, acetaminophen, diphenhydrAMINE, ondansetron **OR** ondansetron (ZOFRAN) IV, polyvinyl alcohol, RESOURCE THICKENUP CLEAR, senna-docusate, sorbitol, traZODone  Assessment/Plan: Principal Problem:   Cardioembolic stroke left MCA Stable for D/C to SNF   1. Functional deficits secondary to left frontal infarct felt to be embolic secondary to  atrial fibrillation2. DVT Prophylaxis/Anticoagulation:back on  Eliquis. Monitor for any bleeding episodes 3. Pain Management: Tylenol as needed  4. Dysphagia.[name] Dysphagia 1 nectar liquids. Minimal intake due to cognition 5. Neuropsych: This patient is not capable of making decisions on her own behalf. -continue low dose trazodone for sleep/restlessness 6. Skin/Wound Care: Routine skin checks 7. Fluids/Electrolytes/Nutrition: Strict I and O follow-up BMET 8. Atrial fibrillation/hypertension. Presently on Eliquis and low-dose Lopressor. 9. Mood/depression. Lexapro 5 mg daily. Provide emotional support 10. Hyperlipidemia. Lipitor 11. Diabetes mellitus with peripheral  neuropathy. Hemoglobin A1c 6.0.  -sugars better with initiation of TF 12. Acute on chronic diastolic congestive heart failure. Monitor for any signs of fluid overload, D/C IVF, Free H2o Flushes 13. Tobacco abuse. Counseling 14. 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

## 2014-05-04 ENCOUNTER — Non-Acute Institutional Stay (SKILLED_NURSING_FACILITY): Payer: Medicare Other | Admitting: Internal Medicine

## 2014-05-04 DIAGNOSIS — I639 Cerebral infarction, unspecified: Secondary | ICD-10-CM

## 2014-05-04 DIAGNOSIS — Z431 Encounter for attention to gastrostomy: Secondary | ICD-10-CM

## 2014-05-04 DIAGNOSIS — E7521 Fabry (-Anderson) disease: Secondary | ICD-10-CM

## 2014-05-04 DIAGNOSIS — E785 Hyperlipidemia, unspecified: Secondary | ICD-10-CM | POA: Diagnosis not present

## 2014-05-04 DIAGNOSIS — E1140 Type 2 diabetes mellitus with diabetic neuropathy, unspecified: Secondary | ICD-10-CM | POA: Diagnosis not present

## 2014-05-04 DIAGNOSIS — I480 Paroxysmal atrial fibrillation: Secondary | ICD-10-CM | POA: Diagnosis not present

## 2014-05-04 DIAGNOSIS — I10 Essential (primary) hypertension: Secondary | ICD-10-CM | POA: Diagnosis not present

## 2014-05-04 NOTE — Progress Notes (Signed)
Social Work Discharge Note  The overall goal for the admission was met for:   Discharge location: No - plan changed to SNF once pt arrived on Rehab  Length of Stay: No - longer stay than anticipated due to PEG and difficulty with SNF placement due to Fabrazyme infusion  Discharge activity level: No - pt required more assistance than expected  Home/community participation: No - tx to SNF  Services provided included: MD, RD, PT, OT, SLP, RN, Pharmacy and SW  Financial Services: Medicare and Private Insurance: [place]  Follow-up services arranged: Other: pt transferred to [facility] (or additional information):  Pt's son could not care for pt at home at this time.  It is his hope that he will be able to do so after some time at the SNF.  Pt will need to go to [facility] for fabrazyme infusion every two weeks.  CSW continues to work with Dr. Elissa Hefty office and pharmaceutical company to obtain free medication.  Patient/Family verbalized understanding of follow-up arrangements: Yes  Individual responsible for coordination of the follow-up plan: pt's son, [name]  Confirmed correct DME delivered: Trey Sailors 05/04/2014    Mykah Shin, Silvestre Mesi

## 2014-05-07 ENCOUNTER — Encounter (HOSPITAL_COMMUNITY): Payer: Self-pay | Admitting: *Deleted

## 2014-05-07 ENCOUNTER — Emergency Department (HOSPITAL_COMMUNITY): Payer: Medicare Other

## 2014-05-07 ENCOUNTER — Emergency Department (HOSPITAL_COMMUNITY)
Admission: EM | Admit: 2014-05-07 | Discharge: 2014-05-07 | Disposition: A | Discharge status: Home / Self Care | Payer: Medicare Other | Attending: Emergency Medicine | Admitting: Emergency Medicine

## 2014-05-07 DIAGNOSIS — E119 Type 2 diabetes mellitus without complications: Secondary | ICD-10-CM | POA: Diagnosis not present

## 2014-05-07 DIAGNOSIS — K9423 Gastrostomy malfunction: Secondary | ICD-10-CM | POA: Insufficient documentation

## 2014-05-07 DIAGNOSIS — I4891 Unspecified atrial fibrillation: Secondary | ICD-10-CM | POA: Diagnosis not present

## 2014-05-07 DIAGNOSIS — Z8639 Personal history of other endocrine, nutritional and metabolic disease: Secondary | ICD-10-CM | POA: Diagnosis not present

## 2014-05-07 DIAGNOSIS — T85528A Displacement of other gastrointestinal prosthetic devices, implants and grafts, initial encounter: Secondary | ICD-10-CM

## 2014-05-07 DIAGNOSIS — I10 Essential (primary) hypertension: Secondary | ICD-10-CM | POA: Diagnosis not present

## 2014-05-07 DIAGNOSIS — Z79899 Other long term (current) drug therapy: Secondary | ICD-10-CM | POA: Insufficient documentation

## 2014-05-07 DIAGNOSIS — Z8673 Personal history of transient ischemic attack (TIA), and cerebral infarction without residual deficits: Secondary | ICD-10-CM | POA: Diagnosis not present

## 2014-05-07 DIAGNOSIS — Z431 Encounter for attention to gastrostomy: Secondary | ICD-10-CM

## 2014-05-07 LAB — CBG MONITORING, ED: GLUCOSE-CAPILLARY: 111 mg/dL — AB (ref 70–99)

## 2014-05-07 MED ORDER — IOHEXOL 300 MG/ML  SOLN
25.0000 mL | Freq: Once | INTRAMUSCULAR | Status: AC | PRN
  Administered 2014-05-07: 25 mL via ORAL

## 2014-05-07 NOTE — ED Notes (Signed)
[facility] has been called to transport patient back to [facility].

## 2014-05-07 NOTE — Discharge Instructions (Signed)
Gastric Tube Replacement °You are having your gastric tube (the tube that goes into the stomach) changed. This is usually a very minor procedure. If medications are prescribed, take them as directed. Only take over-the-counter or prescription medications for pain, discomfort, or fever as directed by your caregiver.  °SEEK IMMEDIATE MEDICAL CARE IF:  °· You develop chills, fever, or show signs of generalized illness. °· You develop bleeding around the tube. °· Your new tube does not seem to be working properly. °· You are unable to get feedings into the tube. °· Your tube comes out for any reason. °Document Released: 11/29/2000 Document Revised: 05/29/2011 Document Reviewed: 03/06/2005 °ExitCare® Patient Information ©2015 [employer]. This information is not intended to replace advice given to you by your health care provider. Make sure you discuss any questions you have with your health care provider. ° °

## 2014-05-07 NOTE — Progress Notes (Signed)
CSW confirmed pt from [facility] and to return when medically stable.   Byrd Hesselbach [phone]  ED CSW 05/07/2014 9:32 AM

## 2014-05-07 NOTE — ED Notes (Signed)
gtube is dislodged and facility doesn't know how long, she also has jevity in her abd space

## 2014-05-07 NOTE — ED Notes (Signed)
Per EMS - patient at baseline mental status per SNF staff.  Vitals WNL, CBG 174.

## 2014-05-07 NOTE — ED Notes (Signed)
Bedside xray completed

## 2014-05-07 NOTE — ED Notes (Addendum)
Patient presents to ED from SNF because her LUQ gastrostomy tube is out.  Patient is s/p CVA in January 2016.  Per SNF staff and EMS, patient is at cognitive baseline.  Patient appears well and cooperative, sitting up for exam, and nods yes/no in response to questions.  Patient indicates she is in no pain and PAINAD is negative.    On exam, patient's lung sounds are clear and equal bilaterally.  Heart sounds WNL S1/S2, no murmur.  +2 radial pulses, +1 pedal pulses.  No pre-tibial or LE edema.  PERRL, alert to baseline.  Bowel sounds hypoactive, abdomen soft and non-tender to palpation.  No drainage noted from nares or eye, buccal mucosa moist.  No drainage or emesis noted to airway.  Arcus senillis noted bilaterally.

## 2014-05-07 NOTE — ED Provider Notes (Signed)
CSN: [id number]     Arrival date & time 05/07/14  0713 History   First MD Initiated Contact with Patient 05/07/14 [phone]     Chief Complaint  Patient presents with  . tube dislodged     Level V Caveat: aphasia  The history is provided by the patient.  sent to ER for evaluation of dislodged 18 french G tube. Facility unsure how long this has been out. Has G tube secondary to large stroke in past.   Past Medical History  Diagnosis Date  . Hypertension   . 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 Expressive aphasia 03/23/2013  . Atrial fibrillation 05/01/2013  . Tobacco abuse 05/01/2013  . Diabetes mellitus without complication   . Depression   . Stroke     2015   Past Surgical History  Procedure Laterality Date  . Tee without cardioversion N/A 03/26/2013    Procedure: TRANSESOPHAGEAL ECHOCARDIOGRAM (TEE);  Surgeon: Thurmon Fair, MD;  Location: [facility] ENDOSCOPY;  Service: Cardiovascular;  Laterality: N/A;  . Cesarean section      x3  . Hip replacement Right 1990's  . Hip replacemet Left 1990's  . Laparoscopic gastrostomy N/A 04/20/2014    Procedure: LAPAROSCOPIC GASTROSTOMY TUBE PLACEMENT;  Surgeon: Axel Filler, MD;  Location: [facility] OR;  Service: General;  Laterality: N/A;   Family History  Problem Relation Age of Onset  . Alcohol abuse Maternal Aunt    History  Substance Use Topics  . Smoking status: Never Smoker   . Smokeless tobacco: Not on file  . Alcohol Use: No   OB History    No data available     Review of Systems  Unable to perform ROS: Other      Allergies  Review of patient's allergies indicates no known allergies.  Home Medications   Prior to Admission medications   Medication Sig Start Date End Date Taking? Authorizing Provider  apixaban (ELIQUIS) 5 MG TABS tablet Take 1 tablet (5 mg total) by mouth 2 (two) times daily. 04/07/14  Yes Drema Dallas, MD  aspirin EC 81 MG tablet Take 81 mg by mouth daily.   Yes Historical Provider, MD  atorvastatin (LIPITOR) 40 MG tablet  Take 1 tablet (40 mg total) by mouth daily at 6 PM. Patient taking differently: Take 40 mg by mouth daily with breakfast.  03/27/13  Yes Myra Rude, MD  cloNIDine (CATAPRES - DOSED IN MG/24 HR) 0.1 mg/24hr patch Place 0.1 mg onto the skin every Saturday.   Yes Historical Provider, MD  escitalopram (LEXAPRO) 5 MG tablet Take 1 tablet (5 mg total) by mouth daily. Patient taking differently: Take 5 mg by mouth daily with breakfast.  02/19/14  Yes Thresa Ross, MD  lisinopril (PRINIVIL,ZESTRIL) 5 MG tablet Take 5 mg by mouth daily with breakfast.    Yes Historical Provider, MD  loratadine (CLARITIN) 10 MG tablet Take 10 mg by mouth daily with breakfast.   Yes Historical Provider, MD  Multiple Vitamin (MULTIVITAMIN) LIQD Take 5 mLs by mouth daily with breakfast.   Yes Historical Provider, MD  acetaminophen (TYLENOL) 500 MG tablet Take 500 mg by mouth every 6 (six) hours as needed for moderate pain.    Historical Provider, MD  agalsidase beta 1 mg/kg in sodium chloride 0.9 % Inject 1 mg/kg into the vein every 14 (fourteen) days.    Historical Provider, MD  brimonidine (ALPHAGAN P) 0.1 % SOLN Place 1 drop into both eyes 2 (two) times daily.    Historical Provider, MD  carboxymethylcellulose 1 % ophthalmic solution Apply 1 drop to eye as needed (for dry eyes).    Historical Provider, MD  Ibuprofen-Diphenhydramine Cit 200-38 MG TABS Take 1 tablet by mouth at bedtime as needed (for sleep).    Historical Provider, MD  Maltodextrin-Xanthan Gum (RESOURCE THICKENUP CLEAR) POWD Take 15,000 g by mouth as needed. 04/07/14   Drema Dallas, MD  metoprolol tartrate (LOPRESSOR) 25 MG tablet Take 0.5 tablets (12.5 mg total) by mouth 2 (two) times daily. 04/07/14   Drema Dallas, MD  Nutritional Supplements (FEEDING SUPPLEMENT, JEVITY 1.2 CAL,) LIQD Place 1,000 mLs into feeding tube continuous. 04/07/14   Drema Dallas, MD  senna-docusate (SENOKOT-S) 8.6-50 MG per tablet Take 1 tablet by mouth at bedtime as needed for  mild constipation. 04/07/14   Drema Dallas, MD   BP 104/71 mmHg  Pulse 76  Temp(Src) 98.4 F (36.9 C) (Oral)  Resp 20  SpO2 97% Physical Exam  Constitutional: She is oriented to person, place, and time. She appears well-developed and well-nourished.  HENT:  Head: Normocephalic.  Eyes: EOM are normal.  Neck: Normal range of motion.  Pulmonary/Chest: Effort normal.  Abdominal: She exhibits no distension.  18 french G tube partially in gastrostomy stoma. Some enteral feeds on abdominal wall. No bleeding from stoma  Musculoskeletal: Normal range of motion.  Neurological: She is alert and oriented to person, place, and time.  Psychiatric: She has a normal mood and affect.  Nursing note and vitals reviewed.   ED Course  Procedures (including critical care time)  Gastrostomy tube replacement Performed by: Lyanne Co Consent: Verbal consent obtained. Risks and benefits: risks, benefits and alternatives were discussed Required items: required blood products, implants, devices, and special equipment available Patient identity confirmed: hospital-assigned identification number Time out: Immediately prior to procedure a "time out" was called to verify the correct patient, procedure, equipment, support staff and site/side marked as required. Preparation: Patient was prepped and draped in the usual sterile fashion. Patient tolerance: Patient tolerated the procedure well with no immediate complications. Comments: 18 french Gastrostomy tube placed without difficulty    Labs Review Labs Reviewed - No data to display  Imaging Review Dg Abd 1 View  05/07/2014   CLINICAL DATA:  G-tube placement with contrast pushed through the new G-tube.  EXAM: ABDOMEN - 1 VIEW  COMPARISON:  Fluoroscopy 04/17/2014  FINDINGS: Oral contrast was injected through a percutaneous gastrostomy tube, outlining the stomach. The retention balloon is at the level of the midbody. No evidence of extravasation.   IMPRESSION: Intraluminal percutaneous gastrostomy tube.   Electronically Signed   By: Marnee Spring M.D.   On: 05/07/2014 09:16  I personally reviewed the imaging tests through PACS system I reviewed available ER/hospitalization records through the EMR    EKG Interpretation None      MDM   Final diagnoses:  Dislodged gastrostomy tube    X ray to confirm placement. Required dilation with 12 french followed by 16 french foley catheter. Steady but gentle pressure applied resulting in placement of G tube    Lyanne Co, MD 05/07/14 1004

## 2014-05-07 NOTE — ED Notes (Signed)
Bed: QX45 Expected date: 05/07/14 Expected time: 7:01 AM Means of arrival: Ambulance Comments: Dislodged gtube

## 2014-05-08 ENCOUNTER — Encounter (HOSPITAL_COMMUNITY): Payer: Medicare Other

## 2014-05-12 ENCOUNTER — Encounter (HOSPITAL_COMMUNITY): Payer: Self-pay

## 2014-05-21 NOTE — Progress Notes (Signed)
Called [facility] and spoke with Gardenia Phlegm (nurse for Rebecca [name]) to confirm her arrival time on 05/22/14 0800 for her appointment in Short Stay for FABRAZYME infusion and that she will be accompanied by their staff member that will remain with patient for the 5 hours she is here for her appointment Pt is able to transfer to and from wheelchair but is aphasic. I confirmed that patient currently still has a PEG tube and receives her nutrition and medication via tube. I asked that all her morning medications be administered to her prior to arrival here in Short Stay and to send a list of her current medications with her transporter.Gardenia Phlegm confirmed all instructions.

## 2014-05-22 ENCOUNTER — Encounter (HOSPITAL_COMMUNITY)
Admission: RE | Admit: 2014-05-22 | Discharge: 2014-05-22 | Disposition: A | Discharge status: Home / Self Care | Payer: No Typology Code available for payment source | Source: Ambulatory Visit | Attending: Nephrology | Admitting: Nephrology

## 2014-05-22 ENCOUNTER — Encounter (HOSPITAL_COMMUNITY): Payer: Self-pay

## 2014-05-22 DIAGNOSIS — E119 Type 2 diabetes mellitus without complications: Secondary | ICD-10-CM | POA: Diagnosis not present

## 2014-05-22 DIAGNOSIS — I4891 Unspecified atrial fibrillation: Secondary | ICD-10-CM | POA: Diagnosis not present

## 2014-05-22 DIAGNOSIS — I10 Essential (primary) hypertension: Secondary | ICD-10-CM | POA: Diagnosis not present

## 2014-05-22 DIAGNOSIS — F17200 Nicotine dependence, unspecified, uncomplicated: Secondary | ICD-10-CM | POA: Diagnosis not present

## 2014-05-22 DIAGNOSIS — R4701 Aphasia: Secondary | ICD-10-CM | POA: Insufficient documentation

## 2014-05-22 DIAGNOSIS — E779 Disorder of glycoprotein metabolism, unspecified: Secondary | ICD-10-CM | POA: Insufficient documentation

## 2014-05-22 DIAGNOSIS — R944 Abnormal results of kidney function studies: Secondary | ICD-10-CM | POA: Diagnosis not present

## 2014-05-22 MED ORDER — SODIUM CHLORIDE 0.9 % IV SOLN
250.0000 mL | INTRAVENOUS | Status: DC
  Administered 2014-05-22: 250 mL via INTRAVENOUS

## 2014-05-22 MED ORDER — SODIUM CHLORIDE 0.9 % IV SOLN: 1.0000 mg/kg | INTRAVENOUS | Status: DC

## 2014-05-22 MED ORDER — ACETAMINOPHEN 160 MG/5ML PO SOLN
500.0000 mg | ORAL | Status: DC
  Administered 2014-05-22: 500 mg
  Filled 2014-05-22: qty 20.3

## 2014-05-22 MED ORDER — AGALSIDASE BETA 5 MG IV SOLR
65.0000 mg | INTRAVENOUS | Status: DC
  Administered 2014-05-22: 65 mg via INTRAVENOUS
  Filled 2014-05-22: qty 13

## 2014-05-22 NOTE — Progress Notes (Signed)
Uneventful infusion of Fabrazyme  Pt has slept at intervals. Pt was assisted to [facility] x1 without results. [facility] was called for transport at the end of the infusion and did not arrive for 1.5 hours. Son is very eager to get his Mom back to her facility for her to rest. Upon [facility] pt has voided a large amount and her adult diaper is saturated. Son eager to have her go back to [facility] so she was transported back and I have called [employer] [facility] and spoke to [place] to report that [name] will need to have dry clothing upon arrival to her facility

## 2014-05-22 NOTE — Progress Notes (Signed)
Pt arrived this AM via [facility] transport on a stretcher from [facility]. Son, [name] has met her here. Pt is alert and able to nod to questions but is aphasic. Pt was assisted from stretcher to recliner with 2 people. [facility] staff was unable to tell me if patient was given AM medication from the report given to them from [facility]. We placed a call to [facility] and spoke to Jenny Reichmann, night nurse for patient and he states that none of AM medication was given. We did obtain a current weight from him. While here in Short Stay Tylenol Elixer given as ordered for premed to Fabrazyme via PEG, no residual and flushed after TYLENOL with 30 cc water and clamped.

## 2014-05-23 ENCOUNTER — Encounter: Payer: Self-pay | Admitting: Internal Medicine

## 2014-05-23 NOTE — Progress Notes (Signed)
Patient ID: Rebecca [name], female   DOB: Dec 19, 1945, 69 y.o.   MRN: [id number]    HISTORY AND PHYSICAL  Location:  [facility]    Place of Service: SNF [phone])   Extended Emergency Contact Information Primary Emergency Contact: Allie Dimmer [place] Home Phone: [phone] Relation: Son  Advanced Directive information  FULL CODE; MOST form on chart- feeding tube ok  Chief Complaint  Patient presents with  . 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 She had a new stroke on top of the old one. Prior to her hospital admission she was somewhat active at home where she lived with her son. She now has a G tube due to dysphagia and is receiving tube feeds. CBGs not checked due to controlled sugars (A1c 6.1% in Jan 2016) and she is not currently taking meds for diabetes. BP is controlled on clonidine, lisinopril and lopressor. She takes lipitor for hyperlipidemia.(LDL 102 in 03/2014). She has been euvolemic since admission to SNF and no exacerbation of heart failure.   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 She is unable to provide additional hx due to aphasia. Hx obtained from chart.  Past Medical History  Diagnosis Date  . Hypertension   . 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 Expressive aphasia 03/23/2013  . Atrial fibrillation 05/01/2013  . Tobacco abuse 05/01/2013  . Diabetes mellitus without complication   . Depression   . Stroke     2015  . Stroke 2016    aphasia    Past Surgical History  Procedure Laterality Date  . Tee without cardioversion N/A 03/26/2013    Procedure: TRANSESOPHAGEAL ECHOCARDIOGRAM (TEE);  Surgeon: Thurmon Fair, MD;  Location: [facility] ENDOSCOPY;  Service: Cardiovascular;  Laterality: N/A;  . Cesarean  section      x3  . Hip replacement Right 1990's  . Hip replacemet Left 1990's  . Laparoscopic gastrostomy N/A 04/20/2014    Procedure: LAPAROSCOPIC GASTROSTOMY TUBE PLACEMENT;  Surgeon: Axel Filler, MD;  Location: [facility] OR;  Service: General;  Laterality: N/A;  . Peg tube placemnt  04/15/14    Patient Care Team: Burtis Junes, MD as PCP - General (Family Medicine)  History   Social History  . Marital Status: Single    Spouse Name: N/A  . Number of Children: N/A  . Years of Education: N/A   Occupational History  . Not on file.   Social History Main Topics  . Smoking status: Never Smoker   . Smokeless tobacco: Not on file  . Alcohol Use: No  . Drug Use: No  . Sexual Activity: Not Currently   Other Topics Concern  . Not on file   Social History Narrative     reports that she has never smoked. She does not have any smokeless tobacco history on file. She reports that she does not drink alcohol or use illicit drugs.  Family History  Problem Relation Age of Onset  . Alcohol abuse Maternal Aunt    No family status information on file.     There is no immunization history on file for this patient.  No Known Allergies  Medications: Patient's Medications  New Prescriptions   No medications on file  Previous  Medications   APIXABAN (ELIQUIS) 5 MG TABS TABLET    Take 1 tablet (5 mg total) by mouth 2 (two) times daily.   ASPIRIN EC 81 MG TABLET    Take 81 mg by mouth daily.   ATORVASTATIN (LIPITOR) 40 MG TABLET    Take 1 tablet (40 mg total) by mouth daily at 6 PM.   CLONIDINE (CATAPRES - DOSED IN MG/24 HR) 0.1 MG/24HR PATCH    Place 0.1 mg onto the skin every Saturday.   ESCITALOPRAM (LEXAPRO) 5 MG TABLET    Take 1 tablet (5 mg total) by mouth daily.   LISINOPRIL (PRINIVIL,ZESTRIL) 5 MG TABLET    Take 5 mg by mouth daily with breakfast.    LORATADINE (CLARITIN) 10 MG TABLET    Take 10 mg by mouth daily with breakfast.   MALTODEXTRIN-XANTHAN GUM (RESOURCE THICKENUP  CLEAR) POWD    Take 15,000 g by mouth as needed.   METOPROLOL TARTRATE (LOPRESSOR) 25 MG TABLET    Take 0.5 tablets (12.5 mg total) by mouth 2 (two) times daily.   MULTIPLE VITAMIN (MULTIVITAMIN) LIQD    Take 5 mLs by mouth daily with breakfast.   NUTRITIONAL SUPPLEMENTS (FEEDING SUPPLEMENT, JEVITY 1.2 CAL,) LIQD    Place 1,000 mLs into feeding tube continuous.   NUTRITIONAL SUPPLEMENTS (FEEDING SUPPLEMENT, JEVITY 1.5 CAL,) LIQD    Place 1,000 mLs into feeding tube every 12 (twelve) hours.   SENNA-DOCUSATE (SENOKOT-S) 8.6-50 MG PER TABLET    Take 1 tablet by mouth at bedtime as needed for mild constipation.   WATER FOR IRRIGATION, STERILE (FREE WATER) SOLN    Place 100 mLs into feeding tube every 4 (four) hours.  Modified Medications   No medications on file  Discontinued Medications   ACETAMINOPHEN (TYLENOL) 500 MG TABLET    Take 500 mg by mouth every 6 (six) hours as needed for moderate pain.   AGALSIDASE BETA 1 MG/KG IN SODIUM CHLORIDE 0.9 %    Inject 1 mg/kg into the vein every 14 (fourteen) days.   BRIMONIDINE (ALPHAGAN P) 0.1 % SOLN    Place 1 drop into both eyes 2 (two) times daily.   CARBOXYMETHYLCELLULOSE 1 % OPHTHALMIC SOLUTION    Apply 1 drop to eye as needed (for dry eyes).   FEXOFENADINE (ALLEGRA) 180 MG TABLET    Take 180 mg by mouth daily.   IBUPROFEN-DIPHENHYDRAMINE CIT 200-38 MG TABS    Take 1 tablet by mouth at bedtime as needed (for sleep).   METOPROLOL SUCCINATE (TOPROL-XL) 25 MG 24 HR TABLET    Take 25 mg by mouth every Monday, Wednesday, and Friday.   MULTIPLE VITAMIN (MULTIVITAMIN WITH MINERALS) TABS TABLET    Take 1 tablet by mouth daily.    Review of Systems  Unable to perform ROS: Patient nonverbal  due to expressive aphasia  Filed Vitals:   05/04/14 1921  BP: 139/80  Pulse: 65  Temp: 97.8 F (36.6 C)   There is no weight on file to calculate BMI.  Physical Exam  Constitutional: She appears well-developed.  Frail appearing in NAD. asleep but easily  awakens.  HENT:  Mouth/Throat: Oropharynx is clear and moist.  She would not open her mouth to exam  Eyes: Pupils are equal, round, and reactive to light. No scleral icterus.  Neck: Neck supple. No tracheal deviation present.  Cardiovascular: Normal rate, regular rhythm, normal heart sounds and intact distal pulses.  Exam reveals no gallop and no friction rub.   No murmur heard. No  LE edema b/l. no calf TTP. TED stockings intact b/l. No carotid bruit b/l  Pulmonary/Chest: Effort normal and breath sounds normal. No stridor. No respiratory distress. She has no wheezes. She has no rales.  Abdominal: Soft. Bowel sounds are normal. She exhibits no distension and no mass. There is no tenderness. There is no rebound and no guarding.  G tube intact and without purulent d/c or redness at insertion site  Lymphadenopathy:    She has no cervical adenopathy.  Neurological: She is alert. She displays atrophy. She displays no tremor. She displays no seizure activity.  She is nonverbal  Skin: Skin is warm and dry. No rash noted.  Psychiatric: She has a normal mood and affect. Her behavior is normal.     Labs reviewed: Admission on 04/07/2014, Discharged on 05/01/2014  No results displayed because visit has over 200 results.  CBC Latest Ref Rng 05/01/2014 04/28/2014 04/25/2014  WBC 4.0 - 10.5 K/uL 5.1 4.5 3.6(L)  Hemoglobin 12.0 - 15.0 g/dL 53.7 11.4(L) 11.2(L)  Hematocrit 36.0 - 46.0 % 34.4(L) 34.4(L) 33.0(L)  Platelets 150 - 400 K/uL 237 250 219    CMP Latest Ref Rng 04/26/2014 04/16/2014 04/08/2014  Glucose 70 - 99 mg/dL 482(L) 078(M) 97  BUN 6 - 23 mg/dL 75(Q) 49(E) 01(E)  Creatinine 0.50 - 1.10 mg/dL 0.71 2.19 7.58  Sodium 135 - 145 mmol/L 140 141 144  Potassium 3.5 - 5.1 mmol/L 4.5 4.3 4.1  Chloride 96 - 112 mmol/L 108 106 104  CO2 19 - 32 mmol/L 27 28 27   Calcium 8.4 - 10.5 mg/dL 8.0(L) 9.2 9.5  Total Protein 6.0 - 8.3 g/dL - - 7.5  Total Bilirubin 0.3 - 1.2 mg/dL - - 0.9  Alkaline Phos 39 -  117 U/L - - 37(L)  AST 0 - 37 U/L - - 35  ALT 0 - 35 U/L - - 19              CLINICAL DATA: Dysphagia, evaluate for gastric tube placement  EXAM: CT ABDOMEN WITHOUT CONTRAST  TECHNIQUE: Multidetector CT imaging of the abdomen was performed following the standard protocol without IV contrast.  COMPARISON: None.  FINDINGS: Lower chest: Mild linear atelectasis in the medial left lower lobe.  Cardiomegaly.  Hepatobiliary: Liver is unremarkable.  Layering sludge in the gallbladder. No intrahepatic or extrahepatic ductal dilatation.  Pancreas: Pancreatic atrophy.  Spleen: Within normal limits.  Adrenals/Urinary Tract: Adrenal glands are unremarkable.  6 mm nonobstructing left renal calculus (coronal image 66). Right kidney is unremarkable. No hydronephrosis.  Stomach/Bowel: Stomach is unremarkable.  Portions of the transverse colon traverses anterior to the gastric body.  Visualized bowel is otherwise grossly unremarkable.  Vascular/Lymphatic: Atherosclerotic calcifications of the abdominal aorta.  No suspicious abdominal lymphadenopathy.  Other: No abdominal ascites.  Musculoskeletal: Degenerative changes of the visualized thoracolumbar spine with dextroscoliosis.  IMPRESSION: 6 mm nonobstructing left renal calculus. No hydronephrosis.  Portions of the transverse colon traverses anterior to the gastric body.  Otherwise unremarkable CT abdomen.   Electronically Signed  By: Charline Bills M.D.  On: 04/15/2014 17:26    CLINICAL DATA: Recent hospitalization for stroke, found noncommunicating and shivering today.  EXAM: MRI HEAD WITHOUT CONTRAST  MRA HEAD WITHOUT CONTRAST  TECHNIQUE: Multiplanar, multiecho pulse sequences of the brain and surrounding structures were obtained without intravenous contrast. Angiographic images of the head were obtained using MRA technique without contrast.  COMPARISON: CT  of the head March 24, 2013 and CT of the head April 01, 2014  FINDINGS: MRI HEAD FINDINGS  Moderately motion degraded examination. Reduced diffusion in LEFT frontal lobe, spanning greater territory than on prior MRI. Corresponding low ADC values. Multiple subcentimeter central with a lesser extent peripheral foci of susceptibility artifact, increased from prior examination though, in a similar distribution.  Moderate ventriculomegaly, likely on the basis of global parenchymal brain volume loss as there is overall commensurate enlargement of cerebral sulci and cerebellar folia. Bilateral small cerebellar infarcts, relatively unchanged. Bilateral occipital lobe encephalomalacia, advanced on the RIGHT, new on the LEFT. Severe confluent supratentorial white matter FLAIR T2 hyperintensities without mass effect. Cystic encephalomalacia of the LEFT frontal lobe underlying the area of T2 bright cytotoxic edema. Bilateral basal ganglia and thalamus lacunar infarcts.  No abnormal extra-axial fluid collections. Status post bilateral ocular lens implants. RIGHT maxillary mucosal retention cyst without paranasal sinus air-fluid levels. The mastoid air cells are well aerated. Mild sellar expansion, can be seen with arachnoid cyst or less likely empty sella considering a thin rind of apparent pituitary tissue along the floor. No cerebellar tonsillar ectopia. No suspicious calvarial bone marrow signal.  MRA HEAD FINDINGS  Moderately motion degraded examination.  Anterior circulation: Flow related enhancement observed within the cervical, petrous, cavernous and supra clinoid internal carotid arteries. Due to motion, there is a duplicated appearance of the carotid siphons. Flow related enhancement within the anterior cerebral arteries, robust flow related enhancement of the RIGHT middle cerebral artery, somewhat less robust flow related enhancement of LEFT middle cerebral artery  without focal conclusion.  Posterior circulation: LEFT vertebral artery is dominant. Very poor visualization of the proximal basilar artery, attributed to motion and tortuosity, with normal flow related enhancement of basilar tip. Normal flow early enhancement of the posterior cerebral arteries.  Dolichoectatic appearance intracranial vessels without large vessel occlusion. Limited assessment for aneurysm or vasculopathy due to the degree of motion.  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  Susceptibility artifact in a pattern suggesting sequela of chronic hypertension with severe white matter changes which may be seen with chronic small vessel ischemic disease. Bilateral basal ganglia and thalamus lacunar infarcts.  Bifrontal encephalomalacia, further propagation on the RIGHT, new on the LEFT consistent with interval bilateral posterior cerebral artery territory infarcts.  MRA HEAD: Moderately motion degraded examination, no large vessel occlusion. Slightly decreased flow related enhancement of the LEFT middle cerebral artery. Findings may be better characterized on CTA of the head as patient had difficulty remaining still for the examination. In addition, poor visualization of the proximal basilar artery which is likely artifact though could be confirmed on CTA of the head.  Dolicoectatic intracranial vessels can be seen with chronic hypertension.   Electronically Signed  By: Awilda Metro  On: 04/02/2014 06:15  Hospital records reviewed. Radiology not able to place feeding tube by fluoroscopy. Recommended surgical/endoscopical placement of Peg   Assessment/Plan   ICD-9-CM ICD-10-CM   1. Stroke - acute left frontal embolic due to #3; on eliquis and ASA 434.91 I63.9   2. PEG (percutaneous endoscopic gastrostomy) adjustment/replacement/removal due to dysphagia V55.1 Z43.1   3. Paroxysmal atrial  fibrillation - rate controlled on lopresor 427.31 I48.0   4. 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 Essential hypertension controlled on lisinopril, lopressor and clonidine 401.9 I10   6. Type 2 diabetes mellitus with diabetic neuropathy controlled with diet 250.60 E11.40    357.2    7. HLD (hyperlipidemia) stable on lipitor 272.4 E78.5     --fabrazyme  infusions as indicated. Contact [facility] for appt  --will need Neurology and cardiology f/u appts to follow afib and CVA  --PT/OT/ST as indicated  --Jevity tube feeding via G tube. All meds crushed and administered thru G tube  --GOAL: short term rehab with possible transition to long term care. Will need to monitor her CBGs as she is on Jevity TF.  Will consider checking CBG BID and if sugar> 150s, will need to start insulin tx. Communicated with nursing.   Monchel Pollitt S. Ancil Linsey  [facility] [address] [place], [place] [address] [phone] Office (Wednesdays and Fridays 8 AM - 5 PM) [phone] Cell (Monday-Friday 8 AM - 5 PM)

## 2014-06-04 ENCOUNTER — Non-Acute Institutional Stay (SKILLED_NURSING_FACILITY): Payer: Medicare Other | Admitting: Adult Health

## 2014-06-04 DIAGNOSIS — I69391 Dysphagia following cerebral infarction: Secondary | ICD-10-CM

## 2014-06-04 DIAGNOSIS — I482 Chronic atrial fibrillation, unspecified: Secondary | ICD-10-CM

## 2014-06-04 DIAGNOSIS — E7521 Fabry (-Anderson) disease: Secondary | ICD-10-CM | POA: Diagnosis not present

## 2014-06-04 DIAGNOSIS — I10 Essential (primary) hypertension: Secondary | ICD-10-CM | POA: Diagnosis not present

## 2014-06-04 DIAGNOSIS — I639 Cerebral infarction, unspecified: Secondary | ICD-10-CM

## 2014-06-04 DIAGNOSIS — E785 Hyperlipidemia, unspecified: Secondary | ICD-10-CM | POA: Diagnosis not present

## 2014-06-04 NOTE — Progress Notes (Signed)
Pt is scheduled for her every 2 week Fabrazyme infusion in the AM (06/05/14). I called [facility] where pt currently resides and spoke with her nurse Gardenia Phlegm to confirm this. I asked that patient receive her AM medication and her Peg tube feeding prior to leaving [facility] and if patient is wearing Depends that  she sends some Depends with patient. Gardenia Phlegm confirmed this would be done and states patient will arrive via PTAR and that patient's current weight is 141 lbs.

## 2014-06-05 ENCOUNTER — Encounter (HOSPITAL_COMMUNITY)
Admission: RE | Admit: 2014-06-05 | Discharge: 2014-06-05 | Disposition: A | Discharge status: Home / Self Care | Payer: No Typology Code available for payment source | Source: Ambulatory Visit | Attending: Nephrology | Admitting: Nephrology

## 2014-06-05 ENCOUNTER — Encounter (HOSPITAL_COMMUNITY): Payer: Self-pay

## 2014-06-05 DIAGNOSIS — E779 Disorder of glycoprotein metabolism, unspecified: Secondary | ICD-10-CM | POA: Diagnosis not present

## 2014-06-05 LAB — LIPID PANEL
Cholesterol: 112 mg/dL (ref 0–200)
HDL: 45 mg/dL (ref 35–70)
LDL Cholesterol: 551 mg/dL
Triglycerides: 83 mg/dL (ref 40–160)

## 2014-06-05 MED ORDER — ACETAMINOPHEN 160 MG/5ML PO SOLN
500.0000 mg | ORAL | Status: DC
  Administered 2014-06-05: 500 mg
  Filled 2014-06-05: qty 20.3

## 2014-06-05 MED ORDER — SODIUM CHLORIDE 0.9 % IV SOLN
250.0000 mL | INTRAVENOUS | Status: DC
  Administered 2014-06-05: 250 mL via INTRAVENOUS

## 2014-06-05 MED ORDER — SODIUM CHLORIDE 0.9 % IV SOLN
1.0000 mg/kg | INTRAVENOUS | Status: DC
  Administered 2014-06-05: 65 mg via INTRAVENOUS
  Filled 2014-06-05: qty 13

## 2014-06-05 NOTE — Discharge Instructions (Signed)
Agalsidase Beta injection °What is this medicine? °AGALSIDASE BETA is used to replace an enzyme that is missing in patients with Fabry disease. It is not a cure. °This medicine may be used for other purposes; ask your health care provider or pharmacist if you have questions. °COMMON BRAND NAME(S): Fabrazyme °What should I tell my health care provider before I take this medicine? °They need to know if you have any of these conditions: °-heart disease °-an unusual or allergic reaction to agalsidase beta, mannitol, other medicines, foods, dyes, or preservatives °-pregnant or trying to get pregnant °-breast-feeding °How should I use this medicine? °This medicine is for infusion into a vein. It is given by a health care professional in a hospital or clinic setting. °Talk to your pediatrician regarding the use of this medicine in children. Special care may be needed. °Overdosage: If you think you have taken too much of this medicine contact a poison control center or emergency room at once. °NOTE: This medicine is only for you. Do not share this medicine with others. °What if I miss a dose? °It is important not to miss your dose. Call your doctor or health care professional if you are unable to keep an appointment. °What may interact with this medicine? °-amiodarone °-chloroquine °-gentamicin °-hydroxychloroquine °-monobenzone °This list may not describe all possible interactions. Give your health care provider a list of all the medicines, herbs, non-prescription drugs, or dietary supplements you use. Also tell them if you smoke, drink alcohol, or use illegal drugs. Some items may interact with your medicine. °What should I watch for while using this medicine? °Visit your doctor or health care professional for regular checks on your progress. Tell your doctor or healthcare professional if your symptoms do not start to get better or if they get worse. °There is a registry for patients with Fabry disease. The registry is  used to gather information about the disease and its effects. Talk to your health care provider if you would like to join the registry. °What side effects may I notice from receiving this medicine? °Side effects that you should report to your doctor or health care professional as soon as possible: °-allergic reactions like skin rash, itching or hives, swelling of the face, lips, or tongue °-breathing problems °-chest pain, tightness °-depression °-dizziness °-fast, irregular heart beat °-swelling of the arms or legs °Side effects that usually do not require medical attention (report to your doctor or health care professional if they continue or are bothersome): °-aches or pains °-anxiety °-fever or chills at the time of injection °-headache °-nausea, vomiting °-stomach pain, upset °This list may not describe all possible side effects. Call your doctor for medical advice about side effects. You may report side effects to FDA at 1-800-FDA-1088. °Where should I keep my medicine? °This drug is given in a hospital or clinic and will not be stored at home. °NOTE: This sheet is a summary. It may not cover all possible information. If you have questions about this medicine, talk to your doctor, pharmacist, or health care provider. °© 2015, [employer]vier/[employer]. (2005-11-13 12:25:00) ° °

## 2014-06-16 ENCOUNTER — Other Ambulatory Visit (HOSPITAL_COMMUNITY): Payer: Self-pay | Admitting: Nephrology

## 2014-06-19 ENCOUNTER — Encounter (HOSPITAL_COMMUNITY): Payer: Self-pay

## 2014-06-19 ENCOUNTER — Encounter (HOSPITAL_COMMUNITY)
Admission: RE | Admit: 2014-06-19 | Discharge: 2014-06-19 | Disposition: A | Discharge status: Home / Self Care | Payer: No Typology Code available for payment source | Source: Ambulatory Visit | Attending: Nephrology | Admitting: Nephrology

## 2014-06-19 DIAGNOSIS — E119 Type 2 diabetes mellitus without complications: Secondary | ICD-10-CM | POA: Diagnosis not present

## 2014-06-19 DIAGNOSIS — F17200 Nicotine dependence, unspecified, uncomplicated: Secondary | ICD-10-CM | POA: Diagnosis not present

## 2014-06-19 DIAGNOSIS — R4701 Aphasia: Secondary | ICD-10-CM | POA: Insufficient documentation

## 2014-06-19 DIAGNOSIS — I10 Essential (primary) hypertension: Secondary | ICD-10-CM | POA: Diagnosis not present

## 2014-06-19 DIAGNOSIS — R944 Abnormal results of kidney function studies: Secondary | ICD-10-CM | POA: Diagnosis not present

## 2014-06-19 DIAGNOSIS — I4891 Unspecified atrial fibrillation: Secondary | ICD-10-CM | POA: Diagnosis not present

## 2014-06-19 DIAGNOSIS — E779 Disorder of glycoprotein metabolism, unspecified: Secondary | ICD-10-CM | POA: Insufficient documentation

## 2014-06-19 MED ORDER — SODIUM CHLORIDE 0.9 % IV SOLN
INTRAVENOUS | Status: DC
  Administered 2014-06-19: 250 mL via INTRAVENOUS

## 2014-06-19 MED ORDER — ACETAMINOPHEN 500 MG PO TABS: 500.0000 mg | ORAL_TABLET | ORAL | Status: DC

## 2014-06-19 MED ORDER — SODIUM CHLORIDE 0.9 % IV SOLN
1.0000 mg/kg | INTRAVENOUS | Status: DC
  Administered 2014-06-19: 65 mg via INTRAVENOUS
  Filled 2014-06-19: qty 13

## 2014-06-19 MED ORDER — ACETAMINOPHEN 160 MG/5ML PO SOLN
500.0000 mg | ORAL | Status: DC
  Administered 2014-06-19: 500 mg via ORAL
  Filled 2014-06-19 (×2): qty 20.3

## 2014-06-19 NOTE — Progress Notes (Signed)
Pt arrived today  For her FABRAZYME infusion via PTAR on stretcher from [facility] where she resides. Transferred patient to stretcher. Pt has a foley cath, Peg tub and adult diaper in place. Pt is unable to speak but will nod in response to questions. She is unsure why she is here and I reminded her about her every 2 week IV infusions. She does smile in responce to questions about remembering her nurses here at Short Stay. We called [facility] and obtained a current weight on patient,infomation that she did have her tube feeding and that she did not receive her AM medication.0 residual obtained from her PEG and Tylenol Elixer 500mg  given as ordered followed by a 30 ml H20 flush.[name]

## 2014-07-03 ENCOUNTER — Encounter (HOSPITAL_COMMUNITY)
Admission: RE | Admit: 2014-07-03 | Discharge: 2014-07-03 | Disposition: A | Discharge status: Home / Self Care | Payer: No Typology Code available for payment source | Source: Ambulatory Visit | Attending: Nephrology | Admitting: Nephrology

## 2014-07-03 ENCOUNTER — Encounter (HOSPITAL_COMMUNITY): Payer: Self-pay

## 2014-07-03 DIAGNOSIS — E779 Disorder of glycoprotein metabolism, unspecified: Secondary | ICD-10-CM | POA: Diagnosis not present

## 2014-07-03 MED ORDER — ACETAMINOPHEN 160 MG/5ML PO SOLN
500.0000 mg | ORAL | Status: DC
  Administered 2014-07-03: 500 mg via ORAL
  Filled 2014-07-03: qty 20.3

## 2014-07-03 MED ORDER — SODIUM CHLORIDE 0.9 % IV SOLN
1.0000 mg/kg | INTRAVENOUS | Status: DC
  Administered 2014-07-03: 65 mg via INTRAVENOUS
  Filled 2014-07-03: qty 13

## 2014-07-03 MED ORDER — SODIUM CHLORIDE 0.9 % IV SOLN
INTRAVENOUS | Status: DC
  Administered 2014-07-03: 250 mL via INTRAVENOUS

## 2014-07-03 NOTE — Progress Notes (Addendum)
Called [facility] at 0830 to ask about arrival time for patient's 0800 scheduled Fabrazyme and spoke with [place]. She states they were unaware she was scheduled for an infusion. Was able to coordinate with [facility] that they would have patient transported to Short Stay today for this infusion.Pt arrived todayfor her FABRAZYME infusion via [facility] on stretcher from [facility] where she resides.called facility  Transferred patient to stretcher in Rm#20. Pt has a foley cath, Peg tub and adult diaper in place. Pt is unable to speak but does says "Yes" when I informed her she was here for her infusion and   will nod in response to questions.  She does smile in response to questions about remembering her nurses here at Short Stay. We called [facility] and obtained a current weight of 142 lbs  And received information on patient,infomation that she did have her tube feeding and that she did not receive her AM medication.0 residual obtained from her PEG and Tylenol Elixer 500mg  given as ordered followed by a 30 ml H20 flush.[name]

## 2014-07-03 NOTE — Progress Notes (Signed)
[facility]AR here for transport back to [facility]. Report given to [facility] staff and copy of future Fabrazyme infusions to be given to staff at [facility]. Son , CHERLIN THONG was here and also asked for a copy of her future appointments that he would also make sure [facility] was aware of the dates and times. Pt had an uneventful infusion of Fabrazyme, sleeping at intervals. Transferred easily from stretcher to [facility].

## 2014-07-05 NOTE — Progress Notes (Signed)
Patient ID: Rebecca [name], female   DOB: 09-13-1945, 69 y.o.   MRN: [id number]  [facility]     No Known Allergies     Chief Complaint  Patient presents with  . Medical Management of Chronic Issues    HPI:  Rebecca [name] is a resident of this facility being seen for the management of her chronic illnesses. Overall her status remains without significant change. Rebecca [name] is unable to participate in the hpi or ros. There are no nursing concerns being voiced today.    Past Medical History  Diagnosis Date  . Hypertension   . 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 Expressive aphasia 03/23/2013  . Atrial fibrillation 05/01/2013  . Tobacco abuse 05/01/2013  . Diabetes mellitus without complication   . Depression   . Stroke     2015  . Stroke 2016    aphasia    Past Surgical History  Procedure Laterality Date  . Tee without cardioversion N/A 03/26/2013    Procedure: TRANSESOPHAGEAL ECHOCARDIOGRAM (TEE);  Surgeon: Thurmon Fair, MD;  Location: [facility] ENDOSCOPY;  Service: Cardiovascular;  Laterality: N/A;  . Cesarean section      x3  . Hip replacement Right 1990's  . Hip replacemet Left 1990's  . Laparoscopic gastrostomy N/A 04/20/2014    Procedure: LAPAROSCOPIC GASTROSTOMY TUBE PLACEMENT;  Surgeon: Axel Filler, MD;  Location: [facility] OR;  Service: General;  Laterality: N/A;  . Peg tube placemnt  04/15/14    VITAL SIGNS BP 120/82 mmHg  Pulse 71  Ht  (1.626 m)  Wt 141 lb (63.957 kg)  BMI 24.19 kg/m2  SpO2 95%   Outpatient Encounter Prescriptions as of 06/04/2014  Medication Sig  . apixaban (ELIQUIS) 5 MG TABS tablet Take 1 tablet (5 mg total) by mouth 2 (two) times daily.  [name] aspirin EC 81 MG tablet Take 81 mg by mouth daily.  [name] atorvastatin (LIPITOR) 40 MG tablet Take 1 tablet (40 mg total) by mouth daily at 6 PM. s  . cloNIDine (CATAPRES - DOSED IN MG/24 HR) 0.1 mg/24hr patch Place 0.1 mg onto the skin every Saturday.  . escitalopram (LEXAPRO) 5 MG tablet Take 1 tablet (5 mg total) by mouth  daily.   [name] lisinopril (PRINIVIL,ZESTRIL) 5 MG tablet Take 5 mg by mouth daily with breakfast.   . loratadine (CLARITIN) 10 MG tablet Take 10 mg by mouth daily with breakfast.  . Maltodextrin-Xanthan Gum (RESOURCE THICKENUP CLEAR) POWD Nectar thick liquids   . metoprolol tartrate (LOPRESSOR) 25 MG tablet Take 0.5 tablets (12.5 mg total) by mouth 2 (two) times daily.  . Multiple Vitamin (MULTIVITAMIN) LIQD Take 5 mLs by mouth daily with breakfast.  . Nutritional Supplements (FEEDING SUPPLEMENT, JEVITY 1.2 CAL,) LIQD Place 1,000 mLs into feeding tube continuous.  . Nutritional Supplements (FEEDING SUPPLEMENT, JEVITY 1.5 CAL,) LIQD Place 1,000 mLs into feeding tube every 12 (twelve) hours.  . Water For Irrigation, Sterile (FREE WATER) SOLN Place 100 mLs into feeding tube every 4 (four) hours.     SIGNIFICANT DIAGNOSTIC EXAMS    LABS REVIEWED:   04-26-14: glucose 138; bun 25; creat 0.97; k+4.5; na++140 05-01-14: wbc 5.1; hgb 12.1; hct 34.4; plt 231    Review of Systems  Unable to perform ROS    Physical Exam  Constitutional: No distress.  Neck: Neck supple. No JVD present.  Cardiovascular: Normal rate, regular rhythm and intact distal pulses.   Respiratory: Effort normal and breath sounds normal. No respiratory distress.  GI: Soft. Bowel sounds are normal. Rebecca [name] exhibits  no distension. There is no tenderness.  Peg tube present   Musculoskeletal: Rebecca [name] exhibits no edema.  Is able to move extremities   Neurological: Rebecca [name] is alert.  Skin: Skin is warm and dry. Rebecca [name] is not diaphoretic.  Stage IIl sacral wound: 3 x 2.8 x 0.4 cm;        ASSESSMENT/ PLAN:  1. Fabry disease: no change in status is due for frabrazyme injection on 06-05-14; will monitor   2. Dysphagia: no signs of aspiration present;  Does require peg tube feedings. will continue on nectar thick liquids and will monitor   3. Afib: heart rate is regular; will continue lopressor 12.5 mg twice daily for rate control; will  continue eliquis 5 mg twice daily and asa 81 mg daily   4. Dyslipidemia: will continue lipitor 40 mg daily   5. Hypertension: is stable will continue lisinopril 5 mg daily and lopressor 12.5 mg twice daily and clonidine 0.1 mg patch changed weekly   6. Depression: will continue lexapro 5 mg daily will monitor   7. Allergic rhinitis: will continue claritin 10 mg daily   8. CVA: is neurologically without change; will continue asa 81 mg daily    Will check lipids next draw     Synthia Innocent NP [facility]  Contact [phone] Monday through Friday 8am- 5pm  After hours call [phone]

## 2014-07-09 ENCOUNTER — Encounter: Payer: Self-pay | Admitting: Internal Medicine

## 2014-07-09 ENCOUNTER — Non-Acute Institutional Stay (SKILLED_NURSING_FACILITY): Payer: Medicare Other | Admitting: Internal Medicine

## 2014-07-09 DIAGNOSIS — T83511A Infection and inflammatory reaction due to indwelling urethral catheter, initial encounter: Secondary | ICD-10-CM

## 2014-07-09 DIAGNOSIS — I10 Essential (primary) hypertension: Secondary | ICD-10-CM

## 2014-07-09 DIAGNOSIS — T8351XA Infection and inflammatory reaction due to indwelling urinary catheter, initial encounter: Secondary | ICD-10-CM | POA: Diagnosis not present

## 2014-07-09 DIAGNOSIS — E785 Hyperlipidemia, unspecified: Secondary | ICD-10-CM

## 2014-07-09 DIAGNOSIS — I482 Chronic atrial fibrillation, unspecified: Secondary | ICD-10-CM

## 2014-07-09 DIAGNOSIS — I639 Cerebral infarction, unspecified: Secondary | ICD-10-CM

## 2014-07-09 DIAGNOSIS — N390 Urinary tract infection, site not specified: Secondary | ICD-10-CM

## 2014-07-09 DIAGNOSIS — E7521 Fabry (-Anderson) disease: Secondary | ICD-10-CM

## 2014-07-09 DIAGNOSIS — R220 Localized swelling, mass and lump, head: Secondary | ICD-10-CM | POA: Diagnosis not present

## 2014-07-09 NOTE — Progress Notes (Addendum)
Patient ID: Rebecca [name], female   DOB: 1945-08-29, 69 y.o.   MRN: [id number]    Facility  [facility]     Place of Service:   SNF  07/09/14  No Known Allergies  Chief Complaint  Patient presents with  . Medical Management of Chronic Issues    HPI:  69 yo female long term resident seen today for f/u. She has no c/o but is nonverbal due to hx aphasia from stroke.no nursing issues. No falls. She is sleeping well. She gets free water flushes through her Peg q4hrs and also Jevity TF BID.  BP stable on clonidine patch, lisinopril and lopressor  She takes eliquis for hx afib  Cholesterol controlled with lipitor  Mood stable on lexapro  She also takes vitamins for supplement  She has a past hx DM but CBGs not checked as she has been well controlled in the past.  Past Medical History  Diagnosis Date  . Hypertension   . 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 Expressive aphasia 03/23/2013  . Atrial fibrillation 05/01/2013  . Tobacco abuse 05/01/2013  . Diabetes mellitus without complication   . Depression   . Stroke     2015  . Stroke 2016    aphasia   Past Surgical History  Procedure Laterality Date  . Tee without cardioversion N/A 03/26/2013    Procedure: TRANSESOPHAGEAL ECHOCARDIOGRAM (TEE);  Surgeon: Thurmon Fair, MD;  Location: [facility] ENDOSCOPY;  Service: Cardiovascular;  Laterality: N/A;  . Cesarean section      x3  . Hip replacement Right 1990's  . Hip replacemet Left 1990's  . Laparoscopic gastrostomy N/A 04/20/2014    Procedure: LAPAROSCOPIC GASTROSTOMY TUBE PLACEMENT;  Surgeon: Axel Filler, MD;  Location: [facility] OR;  Service: General;  Laterality: N/A;  . Peg tube placemnt  04/15/14   History   Social History  . Marital Status: Single    Spouse Name: N/A  . Number of Children: N/A  . Years of Education: N/A   Social History Main Topics  . Smoking status: Never Smoker   . Smokeless tobacco: Not on file  . Alcohol Use: No  . Drug Use: No  . Sexual  Activity: Not Currently   Other Topics Concern  . None   Social History Narrative     Medications: Patient's Medications  New Prescriptions   No medications on file  Previous Medications   APIXABAN (ELIQUIS) 5 MG TABS TABLET    Take 1 tablet (5 mg total) by mouth 2 (two) times daily.   ASPIRIN EC 81 MG TABLET    Take 81 mg by mouth daily.   ATORVASTATIN (LIPITOR) 40 MG TABLET    Take 1 tablet (40 mg total) by mouth daily at 6 PM.   CLONIDINE (CATAPRES - DOSED IN MG/24 HR) 0.1 MG/24HR PATCH    Place 0.1 mg onto the skin every Saturday.   ESCITALOPRAM (LEXAPRO) 5 MG TABLET    Take 1 tablet (5 mg total) by mouth daily.   LISINOPRIL (PRINIVIL,ZESTRIL) 5 MG TABLET    Take 5 mg by mouth daily with breakfast.    LORATADINE (CLARITIN) 10 MG TABLET    Take 10 mg by mouth daily with breakfast.   MALTODEXTRIN-XANTHAN GUM (RESOURCE THICKENUP CLEAR) POWD    Take 15,000 g by mouth as needed.   METOPROLOL TARTRATE (LOPRESSOR) 25 MG TABLET    Take 0.5 tablets (12.5 mg total) by mouth 2 (two) times daily.   MULTIPLE VITAMIN (MULTIVITAMIN) LIQD  Take 5 mLs by mouth daily with breakfast.   NUTRITIONAL SUPPLEMENTS (FEEDING SUPPLEMENT, JEVITY 1.2 CAL,) LIQD    Place 1,000 mLs into feeding tube continuous.   NUTRITIONAL SUPPLEMENTS (FEEDING SUPPLEMENT, JEVITY 1.5 CAL,) LIQD    Place 1,000 mLs into feeding tube every 12 (twelve) hours.   SENNA-DOCUSATE (SENOKOT-S) 8.6-50 MG PER TABLET    Take 1 tablet by mouth at bedtime as needed for mild constipation.   WATER FOR IRRIGATION, STERILE (FREE WATER) SOLN    Place 100 mLs into feeding tube every 4 (four) hours.  Modified Medications   No medications on file  Discontinued Medications   No medications on file     Review of Systems  Unable to perform ROS: Other  pt aphasic due to stroke  Filed Vitals:   07/09/14 1638  BP: 124/75  Pulse: 83  Temp: 97.2 F (36.2 C)  SpO2: 98%   There is no weight on file to calculate BMI.  Physical Exam    Constitutional: She appears well-developed. No distress.  Frail appearing in NAD. nonverbal  HENT:  Mouth/Throat: Oropharynx is clear and moist. No oropharyngeal exudate.  Eyes: Pupils are equal, round, and reactive to light. No scleral icterus.  Neck: Neck supple. Carotid bruit is not present. No tracheal deviation present. No thyromegaly present.    Cardiovascular: Normal rate, regular rhythm and intact distal pulses.  Exam reveals no gallop and no friction rub.   Murmur (1/6 SEM) heard. No LE edema b/l. no calf TTP. TEDs intact b/l  Pulmonary/Chest: Effort normal and breath sounds normal. No stridor. No respiratory distress. She has no wheezes. She has no rales.  Abdominal: Soft. Bowel sounds are normal. She exhibits no distension and no mass. There is no tenderness. There is no rebound and no guarding.  (+) Peg intact without purulent d/c or redness at insertion site  Genitourinary:  Foley intact and DTG sediment noted but no gross blood  Lymphadenopathy:    She has no cervical adenopathy.  Neurological: She is alert. She displays atrophy.  Skin: Skin is warm and dry. No rash noted.  Psychiatric: She has a normal mood and affect. Her behavior is normal.     Labs reviewed: Admission on 05/07/2014, Discharged on 05/07/2014  Component Date Value Ref Range Status  . Glucose-Capillary 05/07/2014 111* 70 - 99 mg/dL Final  Admission on 35/00/9381, Discharged on 05/01/2014  No results displayed because visit has over 200 results.    06/22/14 urine cx grew >100k GNR Recent Results (from the past 2160 hour(s))  Lipid panel     Status: None   Collection Time: 06/05/14 12:00 AM  Result Value Ref Range   Triglycerides 83 40 - 160 mg/dL   Cholesterol 829 0 - 937 mg/dL   HDL 45 35 - 70 mg/dL   LDL Cholesterol 169 mg/dL      Assessment/Plan   ICD-9-CM ICD-10-CM   1. Mass of submandibular region 784.2 R22.0   2. UTI (urinary tract infection) due to urinary indwelling Foley catheter  996.64 T83.51XA    599.0 N39.0    citrobacter  3. Essential hypertension - stable 401.9 I10   4. Chronic atrial fibrillation - rate controlled 427.31 I48.2   5. 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 Hyperlipidemia - stable 272.4 E78.5   7. Stroke hx - stable 434.91 I63.9     --check [place] right submandibular mass to r/o occult process  --start Cipro 500mg   BID x 10 days with floraster  BID x 2 weeks  --cont other meds as ordered  --nutritional supplements as ordered  --Peg tube care as indicated  --foley cath care as indicated  --PT/OT/ST as indicated  --cont fabrazyme infusions q 2weeks  --will follow  Darnisha Vernet S. Ancil Linsey  [facility] [address] [place], [place] [address] [phone] Office (Wednesdays and Fridays 8 AM - 5 PM) [phone] Cell (Monday-Friday 8 AM - 5 PM)

## 2014-07-17 ENCOUNTER — Encounter (HOSPITAL_COMMUNITY)
Admission: RE | Admit: 2014-07-17 | Discharge: 2014-07-17 | Disposition: A | Discharge status: Home / Self Care | Payer: No Typology Code available for payment source | Source: Ambulatory Visit | Attending: Nephrology | Admitting: Nephrology

## 2014-07-17 ENCOUNTER — Encounter (HOSPITAL_COMMUNITY): Payer: Self-pay

## 2014-07-17 DIAGNOSIS — E779 Disorder of glycoprotein metabolism, unspecified: Secondary | ICD-10-CM | POA: Diagnosis not present

## 2014-07-17 MED ORDER — ACETAMINOPHEN 160 MG/5ML PO SOLN
500.0000 mg | ORAL | Status: DC
  Administered 2014-07-17: 500 mg via ORAL
  Filled 2014-07-17: qty 20.3

## 2014-07-17 MED ORDER — SODIUM CHLORIDE 0.9 % IV SOLN
INTRAVENOUS | Status: DC
  Administered 2014-07-17: 250 mL via INTRAVENOUS

## 2014-07-17 MED ORDER — SODIUM CHLORIDE 0.9 % IV SOLN
1.0000 mg/kg | INTRAVENOUS | Status: DC
  Administered 2014-07-17: 65 mg via INTRAVENOUS
  Filled 2014-07-17: qty 13

## 2014-07-17 NOTE — Discharge Instructions (Signed)
IF YOU ARE GOING TO BE 15 OR MINUTES LATE FOR YOUR APPOINTMENT, PLEASE CALL [phone] TO MAKE OTHER ARRANGEMENTS FOR YOUR TREATMENT ° °IF YOU ARRIVE EARLY FOR YOUR SCHEDULED APPOINTMENT , YOU MAY HAVE TO WAIT UNTIL YOUR SCHEDULED TIME.Agalsidase Beta injection °What is this medicine? °AGALSIDASE BETA is used to replace an enzyme that is missing in patients with Fabry disease. It is not a cure. °This medicine may be used for other purposes; ask your health care provider or pharmacist if you have questions. °COMMON BRAND NAME(S): Fabrazyme °What should I tell my health care provider before I take this medicine? °They need to know if you have any of these conditions: °-heart disease °-an unusual or allergic reaction to agalsidase beta, mannitol, other medicines, foods, dyes, or preservatives °-pregnant or trying to get pregnant °-breast-feeding °How should I use this medicine? °This medicine is for infusion into a vein. It is given by a health care professional in a hospital or clinic setting. °Talk to your pediatrician regarding the use of this medicine in children. Special care may be needed. °Overdosage: If you think you have taken too much of this medicine contact a poison control center or emergency room at once. °NOTE: This medicine is only for you. Do not share this medicine with others. °What if I miss a dose? °It is important not to miss your dose. Call your doctor or health care professional if you are unable to keep an appointment. °What may interact with this medicine? °-amiodarone °-chloroquine °-gentamicin °-hydroxychloroquine °-monobenzone °This list may not describe all possible interactions. Give your health care provider a list of all the medicines, herbs, non-prescription drugs, or dietary supplements you use. Also tell them if you smoke, drink alcohol, or use illegal drugs. Some items may interact with your medicine. °What should I watch for while using this medicine? °Visit your doctor or health  care professional for regular checks on your progress. Tell your doctor or healthcare professional if your symptoms do not start to get better or if they get worse. °There is a registry for patients with Fabry disease. The registry is used to gather information about the disease and its effects. Talk to your health care provider if you would like to join the registry. °What side effects may I notice from receiving this medicine? °Side effects that you should report to your doctor or health care professional as soon as possible: °-allergic reactions like skin rash, itching or hives, swelling of the face, lips, or tongue °-breathing problems °-chest pain, tightness °-depression °-dizziness °-fast, irregular heart beat °-swelling of the arms or legs °Side effects that usually do not require medical attention (report to your doctor or health care professional if they continue or are bothersome): °-aches or pains °-anxiety °-fever or chills at the time of injection °-headache °-nausea, vomiting °-stomach pain, upset °This list may not describe all possible side effects. Call your doctor for medical advice about side effects. You may report side effects to FDA at 1-800-FDA-1088. °Where should I keep my medicine? °This drug is given in a hospital or clinic and will not be stored at home. °NOTE: This sheet is a summary. It may not cover all possible information. If you have questions about this medicine, talk to your doctor, pharmacist, or health care provider. °© 2015, Elsevier/Gold Standard. (2005-11-13 12:25:00) ° °

## 2014-07-24 ENCOUNTER — Other Ambulatory Visit: Payer: Self-pay | Admitting: *Deleted

## 2014-07-31 ENCOUNTER — Encounter (HOSPITAL_COMMUNITY): Payer: Self-pay

## 2014-07-31 ENCOUNTER — Encounter (HOSPITAL_COMMUNITY)
Admission: RE | Admit: 2014-07-31 | Discharge: 2014-07-31 | Disposition: A | Discharge status: Home / Self Care | Payer: No Typology Code available for payment source | Source: Ambulatory Visit | Attending: Nephrology | Admitting: Nephrology

## 2014-07-31 DIAGNOSIS — E119 Type 2 diabetes mellitus without complications: Secondary | ICD-10-CM | POA: Insufficient documentation

## 2014-07-31 DIAGNOSIS — E779 Disorder of glycoprotein metabolism, unspecified: Secondary | ICD-10-CM | POA: Diagnosis not present

## 2014-07-31 DIAGNOSIS — I10 Essential (primary) hypertension: Secondary | ICD-10-CM | POA: Diagnosis not present

## 2014-07-31 DIAGNOSIS — I4891 Unspecified atrial fibrillation: Secondary | ICD-10-CM | POA: Diagnosis not present

## 2014-07-31 DIAGNOSIS — R944 Abnormal results of kidney function studies: Secondary | ICD-10-CM | POA: Insufficient documentation

## 2014-07-31 DIAGNOSIS — R4701 Aphasia: Secondary | ICD-10-CM | POA: Diagnosis not present

## 2014-07-31 DIAGNOSIS — F17200 Nicotine dependence, unspecified, uncomplicated: Secondary | ICD-10-CM | POA: Insufficient documentation

## 2014-07-31 MED ORDER — SODIUM CHLORIDE 0.9 % IV SOLN
1.0000 mg/kg | INTRAVENOUS | Status: DC
  Administered 2014-07-31: 65 mg via INTRAVENOUS
  Filled 2014-07-31: qty 13

## 2014-07-31 MED ORDER — ACETAMINOPHEN 160 MG/5ML PO SOLN
500.0000 mg | ORAL | Status: DC
  Administered 2014-07-31: 500 mg via ORAL
  Filled 2014-07-31: qty 20.3

## 2014-07-31 MED ORDER — SODIUM CHLORIDE 0.9 % IV SOLN
INTRAVENOUS | Status: DC
  Administered 2014-07-31: 250 mL via INTRAVENOUS

## 2014-07-31 NOTE — Progress Notes (Signed)
Patient arrived at 35 via [facility] transportation on a stretcher from [facility]. Able to open eyes when we are talking to her, able to nod, smiling at times. No foley catheter in patient - came from facility with dry adult depends diaper on. Will offer patient bedpan/check for any incontinence frequently throughout infusion. Peg tube checked and no residual. Patient's HOB up 30 degrees. Tylenol elixir given via peg tube per pre med order. Flushed with 100cc water. Med and water flush went in easily via gravity through tube. IV started with normal saline and Fabrazyme infusion about to begin.

## 2014-07-31 NOTE — Progress Notes (Signed)
[name] visiting and at patient's bedside. Rebecca [name] is smiling and holding her son's hand.

## 2014-08-13 NOTE — Progress Notes (Signed)
Courtesy call to [facility] to remind them of this patient's appointment on 08/14/14 0800 in [facility] for her every 2 week Fabrazyme infusion. Spoke to [name] and asked her make sure her transportation has been arranged and that she is to have her AM medication. If she is to arrive via w/c to place the lift pad under her so she can be transferred to stretcher upon arrival. Jaymes Graff verbalized understanding

## 2014-08-14 ENCOUNTER — Encounter (HOSPITAL_COMMUNITY)
Admission: RE | Admit: 2014-08-14 | Discharge: 2014-08-14 | Disposition: A | Discharge status: Home / Self Care | Payer: No Typology Code available for payment source | Source: Ambulatory Visit | Attending: Nephrology | Admitting: Nephrology

## 2014-08-14 ENCOUNTER — Encounter (HOSPITAL_COMMUNITY): Payer: Self-pay

## 2014-08-14 DIAGNOSIS — I10 Essential (primary) hypertension: Secondary | ICD-10-CM | POA: Diagnosis not present

## 2014-08-14 DIAGNOSIS — E779 Disorder of glycoprotein metabolism, unspecified: Secondary | ICD-10-CM | POA: Diagnosis not present

## 2014-08-14 DIAGNOSIS — E119 Type 2 diabetes mellitus without complications: Secondary | ICD-10-CM | POA: Diagnosis not present

## 2014-08-14 DIAGNOSIS — I4891 Unspecified atrial fibrillation: Secondary | ICD-10-CM | POA: Diagnosis not present

## 2014-08-14 DIAGNOSIS — F17200 Nicotine dependence, unspecified, uncomplicated: Secondary | ICD-10-CM | POA: Diagnosis not present

## 2014-08-14 DIAGNOSIS — R944 Abnormal results of kidney function studies: Secondary | ICD-10-CM | POA: Diagnosis not present

## 2014-08-14 DIAGNOSIS — R4701 Aphasia: Secondary | ICD-10-CM | POA: Diagnosis not present

## 2014-08-14 MED ORDER — SODIUM CHLORIDE 0.9 % IV SOLN
INTRAVENOUS | Status: AC
  Administered 2014-08-14: 250 mL via INTRAVENOUS

## 2014-08-14 MED ORDER — SODIUM CHLORIDE 0.9 % IV SOLN
1.0000 mg/kg | INTRAVENOUS | Status: AC
  Administered 2014-08-14: 65 mg via INTRAVENOUS
  Filled 2014-08-14: qty 13

## 2014-08-14 MED ORDER — ACETAMINOPHEN 160 MG/5ML PO SOLN
500.0000 mg | ORAL | Status: AC
  Administered 2014-08-14: 500 mg via ORAL
  Filled 2014-08-14: qty 20.3

## 2014-08-14 NOTE — Progress Notes (Signed)
Uneventful infusion of FABRAZYME over 4 hours as ordered. Family visiting and pt is very animated and enjoying the visit. Pt has not voided incontently. [facility] here to transport patient back to [facility] . Transferred from [employer] to Doctor, general practice.

## 2014-08-14 NOTE — Progress Notes (Signed)
Pt arrived today from [facility] via Doctor, general practice via [facility]. Pt was transferred to stretcher in Short Stay. Pt is alert and limited speech consisting of yes/no but she does acknowledge by yes and head nod that she" is here for her infusion".and recognition of the nurse caring for her today. Called [facility] and asked her nurse for patient's current weight ( 144 lbs) and confirmed that she had her tube feeding but did not have her AM medications.Upon arrival patient had an adult diaper in place that was saturated with urine. This was removed and fresh dry chux and pad placed under patient. Pt has PEG tube and checked for residual ( 0 ) then Tylenol Elixer given via PEG and flushed with 30 ml water.

## 2014-08-14 NOTE — Progress Notes (Signed)
Pt sleeping at intervals then awakened by surprise visit from daughter and grandchildern. Alert and animated and smiling. Engaging with her family.

## 2014-08-20 ENCOUNTER — Non-Acute Institutional Stay (SKILLED_NURSING_FACILITY): Payer: Medicare Other | Admitting: Adult Health

## 2014-08-20 DIAGNOSIS — I482 Chronic atrial fibrillation, unspecified: Secondary | ICD-10-CM

## 2014-08-20 DIAGNOSIS — I69391 Dysphagia following cerebral infarction: Secondary | ICD-10-CM

## 2014-08-20 DIAGNOSIS — E785 Hyperlipidemia, unspecified: Secondary | ICD-10-CM | POA: Diagnosis not present

## 2014-08-20 DIAGNOSIS — R4701 Aphasia: Secondary | ICD-10-CM | POA: Diagnosis not present

## 2014-08-20 DIAGNOSIS — I10 Essential (primary) hypertension: Secondary | ICD-10-CM | POA: Diagnosis not present

## 2014-08-20 DIAGNOSIS — I639 Cerebral infarction, unspecified: Secondary | ICD-10-CM | POA: Diagnosis not present

## 2014-08-27 NOTE — Progress Notes (Signed)
Spoke with [place] at [facility] to remind her that pt is scheduled for her Fabrazyme infusion on 08/28/14 at 0800. If she is arriving via w/c to make sure [employer] is in chair, send an additional Depends, weight patient and send current weight with transportation,patient to receive all her AM medication prior to arrival . Pt is still getting PEG feedings at night. [place] verbalized understanding

## 2014-08-28 ENCOUNTER — Encounter (HOSPITAL_COMMUNITY)
Admission: RE | Admit: 2014-08-28 | Discharge: 2014-08-28 | Disposition: A | Discharge status: Home / Self Care | Payer: Medicare Other | Source: Ambulatory Visit | Attending: Nephrology | Admitting: Nephrology

## 2014-08-28 ENCOUNTER — Encounter (HOSPITAL_COMMUNITY): Payer: Self-pay

## 2014-08-28 ENCOUNTER — Other Ambulatory Visit (HOSPITAL_COMMUNITY): Payer: Self-pay | Admitting: Nephrology

## 2014-08-28 DIAGNOSIS — R944 Abnormal results of kidney function studies: Secondary | ICD-10-CM | POA: Insufficient documentation

## 2014-08-28 DIAGNOSIS — F17200 Nicotine dependence, unspecified, uncomplicated: Secondary | ICD-10-CM | POA: Insufficient documentation

## 2014-08-28 DIAGNOSIS — E119 Type 2 diabetes mellitus without complications: Secondary | ICD-10-CM | POA: Diagnosis not present

## 2014-08-28 DIAGNOSIS — E779 Disorder of glycoprotein metabolism, unspecified: Secondary | ICD-10-CM | POA: Insufficient documentation

## 2014-08-28 DIAGNOSIS — I10 Essential (primary) hypertension: Secondary | ICD-10-CM | POA: Diagnosis not present

## 2014-08-28 DIAGNOSIS — I4891 Unspecified atrial fibrillation: Secondary | ICD-10-CM | POA: Insufficient documentation

## 2014-08-28 DIAGNOSIS — R4701 Aphasia: Secondary | ICD-10-CM | POA: Insufficient documentation

## 2014-08-28 MED ORDER — AGALSIDASE BETA 5 MG IV SOLR
1.0000 mg/kg | INTRAVENOUS | Status: DC
  Administered 2014-08-28: 65 mg via INTRAVENOUS
  Filled 2014-08-28: qty 13

## 2014-08-28 MED ORDER — SODIUM CHLORIDE 0.9 % IV SOLN
INTRAVENOUS | Status: DC
  Administered 2014-08-28: 250 mL via INTRAVENOUS

## 2014-08-28 MED ORDER — ACETAMINOPHEN 160 MG/5ML PO SOLN
500.0000 mg | ORAL | Status: DC
  Administered 2014-08-28: 500 mg via ORAL
  Filled 2014-08-28: qty 20.3

## 2014-08-28 NOTE — Progress Notes (Signed)
Patient's respirations alternate between labored and using ancillary muscles to shallow respirations. Lungs are clear to auscultate but diminished in bases. Pulse ox is 98-99%. No ankle or sacral edema noted.  Patient will open her eyes when talked to and appearance on face is one of annoyance at being woke up.

## 2014-08-28 NOTE — Progress Notes (Signed)
Pt is no longer having labored respirations. She is more awake and smiling. Changed Depends. VSS.

## 2014-08-28 NOTE — Progress Notes (Signed)
Arrived via [facility] upon stretcher for her every 2 week Fabrazyme infusion. Pt using" faces" denies pain. Pt is alert and aphasic but will answer yes and no questions . Pt is aware she is at [facility] for her Fabrazyme infusion. Pt has peg tube in place

## 2014-08-28 NOTE — Progress Notes (Addendum)
PEG tube .no residual. Tylenol elixer given as ordered and followed by 30 ml water and capped.. Pt arrived today with dry Depends in place and 2 additional ones sent with patient from [facility]

## 2014-08-28 NOTE — Progress Notes (Signed)
Uneventful infusion of Fabrazyme. Pt was awakened from a nap as [facility] has arrived to transport back to [facility]. Pt is warm and dry smiling.

## 2014-09-09 NOTE — Progress Notes (Addendum)
Patient ID: Rebecca [name], female   DOB: 11-09-1945, 69 y.o.   MRN: [id number]  [facility].     No Known Allergies     Chief Complaint  Patient presents with  . Medical Management of Chronic Issues    HPI:  She is a long term resident of this facility being seen for the management of her chronic illnesses. Overall she is doing well. She is unable to fully participate in the hpi or ros. There are no nursing concerns at this time.    Past Medical History  Diagnosis Date  . Hypertension   . 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 Expressive aphasia 03/23/2013  . Atrial fibrillation 05/01/2013  . Tobacco abuse 05/01/2013  . Diabetes mellitus without complication   . Depression   . Stroke     2015  . Stroke 2016    aphasia    Past Surgical History  Procedure Laterality Date  . Tee without cardioversion N/A 03/26/2013    Procedure: TRANSESOPHAGEAL ECHOCARDIOGRAM (TEE);  Surgeon: Thurmon Fair, MD;  Location: [facility] ENDOSCOPY;  Service: Cardiovascular;  Laterality: N/A;  . Cesarean section      x3  . Hip replacement Right 1990's  . Hip replacemet Left 1990's  . Laparoscopic gastrostomy N/A 04/20/2014    Procedure: LAPAROSCOPIC GASTROSTOMY TUBE PLACEMENT;  Surgeon: Axel Filler, MD;  Location: [facility] OR;  Service: General;  Laterality: N/A;  . Peg tube placemnt  04/15/14    VITAL SIGNS BP 122/77 mmHg  Pulse 84  Ht  (1.6 m)  Wt 144 lb (65.318 kg)  BMI 25.51 kg/m2  SpO2 98%   Outpatient Encounter Prescriptions as of 08/20/2014  Medication Sig  . apixaban (ELIQUIS) 5 MG TABS tablet Take 1 tablet (5 mg total) by mouth 2 (two) times daily.  [name] aspirin EC 81 MG tablet Take 81 mg by mouth daily.  [name] atorvastatin (LIPITOR) 40 MG tablet Take 40 mg by mouth daily with breakfast. )  . cloNIDine (CATAPRES - DOSED IN MG/24 HR) 0.1 mg/24hr patch Place 0.1 mg onto the skin every Saturday.  . escitalopram (LEXAPRO) 5 MG tablet  Take 5 mg by mouth daily with breakfast. )  . lisinopril  (PRINIVIL,ZESTRIL) 5 MG tablet Take 5 mg by mouth daily with breakfast.   . loratadine (CLARITIN) 10 MG tablet Take 10 mg by mouth daily with breakfast.  . metoprolol tartrate (LOPRESSOR) 25 MG tablet Take 0.5 tablets (12.5 mg total) by mouth 2 (two) times daily.  . Multiple Vitamin (MULTIVITAMIN) LIQD Take 5 mLs by mouth daily with breakfast.  . Nutritional Supplements (FEEDING SUPPLEMENT, JEVITY 1.2 CAL,) LIQD Place 1,000 mLs into feeding tube continuous.  . Nutritional Supplements (FEEDING SUPPLEMENT, JEVITY 1.5 CAL,) LIQD Place 1,000 mLs into feeding tube every 12 (twelve) hours.  . Water For Irrigation, Sterile (FREE WATER) SOLN Place 100 mLs into feeding tube every 4 (four) hours.      SIGNIFICANT DIAGNOSTIC EXAMS   LABS REVIEWED:   04-26-14: glucose 138; bun 25; creat 0.97; k+4.5; na++140 05-01-14: wbc 5.1; hgb 12.1; hct 34.4; plt 231     ROS Unable to perform ROS    Physical Exam Constitutional: No distress.  Neck: Neck supple. No JVD present.  Cardiovascular: Normal rate, regular rhythm and intact distal pulses.   Respiratory: Effort normal and breath sounds normal. No respiratory distress.  GI: Soft. Bowel sounds are normal. She exhibits no distension. There is no tenderness.  Peg tube present   Musculoskeletal: She exhibits no edema.  Is able to move extremities   Neurological: She is alert.  Skin: Skin is warm and dry. She is not diaphoretic.      ASSESSMENT/ PLAN:  1. 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 Dysphagia: no signs of aspiration present;  Does require peg tube feedings. will continue on thin liquids and will monitor   3. Afib: heart rate is regular; will continue lopressor 12.5 mg twice daily for rate control; will continue eliquis 5 mg twice daily and asa 81 mg daily   4. Dyslipidemia: will continue lipitor 40 mg daily   5. Hypertension: is stable will continue lisinopril 5 mg  daily and lopressor 12.5 mg twice daily and clonidine 0.1 mg patch changed weekly   6. Depression: will continue lexapro 5 mg daily will monitor   7. Allergic rhinitis: will continue claritin 10 mg daily   8. CVA: is neurologically without change; will continue asa 81 mg daily        Synthia Innocent NP [facility]  Contact [phone] Monday through Friday 8am- 5pm  After hours call [phone]

## 2014-09-10 NOTE — Progress Notes (Signed)
Called [facility] and spoke with Mariah Milling regarding Mrs. Charles appointment on Friday.

## 2014-09-11 ENCOUNTER — Encounter (HOSPITAL_COMMUNITY): Payer: Self-pay

## 2014-09-11 ENCOUNTER — Encounter (HOSPITAL_COMMUNITY)
Admission: RE | Admit: 2014-09-11 | Discharge: 2014-09-11 | Disposition: A | Discharge status: Home / Self Care | Payer: Medicare Other | Source: Ambulatory Visit | Attending: Nephrology | Admitting: Nephrology

## 2014-09-11 DIAGNOSIS — E779 Disorder of glycoprotein metabolism, unspecified: Secondary | ICD-10-CM | POA: Diagnosis not present

## 2014-09-11 MED ORDER — SODIUM CHLORIDE 0.9 % IV SOLN
1.0000 mg/kg | INTRAVENOUS | Status: DC
  Administered 2014-09-11: 65 mg via INTRAVENOUS
  Filled 2014-09-11: qty 13

## 2014-09-11 MED ORDER — SODIUM CHLORIDE 0.9 % IV SOLN
INTRAVENOUS | Status: DC
  Administered 2014-09-11: 08:00:00 via INTRAVENOUS

## 2014-09-11 MED ORDER — ACETAMINOPHEN 160 MG/5ML PO SOLN
500.0000 mg | ORAL | Status: DC
  Administered 2014-09-11: 500 mg via ORAL
  Filled 2014-09-11: qty 20.3

## 2014-09-11 NOTE — Progress Notes (Signed)
No problems with infusion of Fabrazyme, [facility] here to transport pt. Back to [facility].

## 2014-09-11 NOTE — Discharge Instructions (Signed)
Agalsidase Beta injection °What is this medicine? °AGALSIDASE BETA is used to replace an enzyme that is missing in patients with Fabry disease. It is not a cure. °This medicine may be used for other purposes; ask your health care provider or pharmacist if you have questions. °COMMON BRAND NAME(S): Fabrazyme °What should I tell my health care provider before I take this medicine? °They need to know if you have any of these conditions: °-heart disease °-an unusual or allergic reaction to agalsidase beta, mannitol, other medicines, foods, dyes, or preservatives °-pregnant or trying to get pregnant °-breast-feeding °How should I use this medicine? °This medicine is for infusion into a vein. It is given by a health care professional in a hospital or clinic setting. °Talk to your pediatrician regarding the use of this medicine in children. Special care may be needed. °Overdosage: If you think you have taken too much of this medicine contact a poison control center or emergency room at once. °NOTE: This medicine is only for you. Do not share this medicine with others. °What if I miss a dose? °It is important not to miss your dose. Call your doctor or health care professional if you are unable to keep an appointment. °What may interact with this medicine? °-amiodarone °-chloroquine °-gentamicin °-hydroxychloroquine °-monobenzone °This list may not describe all possible interactions. Give your health care provider a list of all the medicines, herbs, non-prescription drugs, or dietary supplements you use. Also tell them if you smoke, drink alcohol, or use illegal drugs. Some items may interact with your medicine. °What should I watch for while using this medicine? °Visit your doctor or health care professional for regular checks on your progress. Tell your doctor or healthcare professional if your symptoms do not start to get better or if they get worse. °There is a registry for patients with Fabry disease. The registry is  used to gather information about the disease and its effects. Talk to your health care provider if you would like to join the registry. °What side effects may I notice from receiving this medicine? °Side effects that you should report to your doctor or health care professional as soon as possible: °-allergic reactions like skin rash, itching or hives, swelling of the face, lips, or tongue °-breathing problems °-chest pain, tightness °-depression °-dizziness °-fast, irregular heart beat °-swelling of the arms or legs °Side effects that usually do not require medical attention (report to your doctor or health care professional if they continue or are bothersome): °-aches or pains °-anxiety °-fever or chills at the time of injection °-headache °-nausea, vomiting °-stomach pain, upset °This list may not describe all possible side effects. Call your doctor for medical advice about side effects. You may report side effects to FDA at 1-800-FDA-1088. °Where should I keep my medicine? °This drug is given in a hospital or clinic and will not be stored at home. °NOTE: This sheet is a summary. It may not cover all possible information. If you have questions about this medicine, talk to your doctor, pharmacist, or health care provider. °© 2015, [employer]vier/[employer]. (2005-11-13 12:25:00) ° °

## 2014-09-22 ENCOUNTER — Non-Acute Institutional Stay (SKILLED_NURSING_FACILITY): Payer: Medicare Other | Admitting: Adult Health

## 2014-09-22 ENCOUNTER — Encounter: Payer: Self-pay | Admitting: Adult Health

## 2014-09-22 DIAGNOSIS — I639 Cerebral infarction, unspecified: Secondary | ICD-10-CM | POA: Diagnosis not present

## 2014-09-22 DIAGNOSIS — R4702 Dysphasia: Secondary | ICD-10-CM | POA: Diagnosis not present

## 2014-09-22 DIAGNOSIS — E7521 Fabry (-Anderson) disease: Secondary | ICD-10-CM

## 2014-09-22 DIAGNOSIS — I10 Essential (primary) hypertension: Secondary | ICD-10-CM | POA: Diagnosis not present

## 2014-09-22 DIAGNOSIS — E785 Hyperlipidemia, unspecified: Secondary | ICD-10-CM | POA: Diagnosis not present

## 2014-09-22 DIAGNOSIS — I480 Paroxysmal atrial fibrillation: Secondary | ICD-10-CM

## 2014-09-22 DIAGNOSIS — I69391 Dysphagia following cerebral infarction: Secondary | ICD-10-CM

## 2014-09-22 DIAGNOSIS — R4701 Aphasia: Secondary | ICD-10-CM

## 2014-09-22 NOTE — Progress Notes (Signed)
Patient ID: Rebecca [name], female   DOB: Aug 10, 1945, 69 y.o.   MRN: [id number]  [facility]     No Known Allergies     Chief Complaint  Patient presents with  . Medical Management of Chronic Issues    HPI:  She is a long term resident of this facility being seen for the management of her chronic illnesses. Overall her status is stable. She is unable to fully participate in the hpi or ros. There are no nursing concerns at this time.    Past Medical History  Diagnosis Date  . Hypertension   . 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 Expressive aphasia 03/23/2013  . Atrial fibrillation 05/01/2013  . Tobacco abuse 05/01/2013  . Diabetes mellitus without complication   . Depression   . Stroke     2015  . Stroke 2016    aphasia    Past Surgical History  Procedure Laterality Date  . Tee without cardioversion N/A 03/26/2013    Procedure: TRANSESOPHAGEAL ECHOCARDIOGRAM (TEE);  Surgeon: Thurmon Fair, MD;  Location: [facility] ENDOSCOPY;  Service: Cardiovascular;  Laterality: N/A;  . Cesarean section      x3  . Hip replacement Right 1990's  . Hip replacemet Left 1990's  . Laparoscopic gastrostomy N/A 04/20/2014    Procedure: LAPAROSCOPIC GASTROSTOMY TUBE PLACEMENT;  Surgeon: Axel Filler, MD;  Location: [facility] OR;  Service: General;  Laterality: N/A;  . Peg tube placemnt  04/15/14    VITAL SIGNS BP 151/90 mmHg  Pulse 80  Ht  (1.626 m)  Wt 145 lb (65.772 kg)  BMI 24.88 kg/m2  SpO2 96%   Outpatient Encounter Prescriptions as of 09/22/2014  Medication Sig  . apixaban (ELIQUIS) 5 MG TABS tablet Take 1 tablet (5 mg total) by mouth 2 (two) times daily.  [name] aspirin EC 81 MG tablet Take 81 mg by mouth daily.  [name] atorvastatin (LIPITOR) 40 MG tablet Take 1 tablet (40 mg total) by mouth daily at 6 PM. (Patient taking differently: Take 40 mg by mouth daily with breakfast. )  . cloNIDine (CATAPRES - DOSED IN MG/24 HR) 0.1 mg/24hr patch Place 0.1 mg onto the skin every Saturday.  . escitalopram  (LEXAPRO) 5 MG tablet Take 1 tablet (5 mg total) by mouth daily. (Patient taking differently: Take 5 mg by mouth daily with breakfast. )  . lisinopril (PRINIVIL,ZESTRIL) 5 MG tablet Take 5 mg by mouth daily with breakfast.   . loratadine (CLARITIN) 10 MG tablet Take 10 mg by mouth daily with breakfast.  . metoprolol tartrate (LOPRESSOR) 25 MG tablet Take 0.5 tablets (12.5 mg total) by mouth 2 (two) times daily.  . Multiple Vitamin (MULTIVITAMIN) LIQD Take 5 mLs by mouth daily with breakfast.  . Nutritional Supplements (FEEDING SUPPLEMENT, JEVITY 1.5 CAL,) LIQD Place 1,000 mLs into feeding tube every 12 (twelve) hours.  . Water For Irrigation, Sterile (FREE WATER) SOLN Place 100 mLs into feeding tube every 4 (four) hours.      SIGNIFICANT DIAGNOSTIC EXAMS  07-10-14: right neck ultrasound: no abnormality identified in the area of concern in the right submandibular region   LABS REVIEWED:   04-26-14: glucose 138; bun 25; creat 0.97; k+4.5; na++140 05-01-14: wbc 5.1; hgb 12.1; hct 34.4; plt 231  06-05-14: chol 112; ldl 51; trig 83; hdl 45  06-22-14: urine culture: citrobacter koseri: cipro     ROS Unable to perform ROS    Physical Exam Constitutional: No distress.  Neck: Neck supple. No JVD present.  Cardiovascular: Normal rate, regular  rhythm and intact distal pulses.   Respiratory: Effort normal and breath sounds normal. No respiratory distress.  GI: Soft. Bowel sounds are normal. She exhibits no distension. There is no tenderness.  Peg tube present   Musculoskeletal: She exhibits no edema.  Is able to move extremities   Neurological: She is alert.  Skin: Skin is warm and dry. She is not diaphoretic.       ASSESSMENT/ PLAN:  1. 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 Dysphagia: no signs of aspiration present;  Does require peg tube feedings. will continue on thin liquids with regular diet and will monitor   3.  Afib: heart rate is regular; will continue lopressor 12.5 mg twice daily for rate control; will continue eliquis 5 mg twice daily and asa 81 mg daily   4. Dyslipidemia: will continue lipitor 40 mg daily  ldl is 51   5. Hypertension: is stable will continue lisinopril 5 mg daily and lopressor 12.5 mg twice daily and clonidine 0.1 mg patch changed weekly   6. Depression: will continue lexapro 5 mg daily will monitor   7. Allergic rhinitis: will continue claritin 10 mg daily   8. CVA: has aphasia:  is neurologically without change; will continue asa 81 mg daily eliquis 5 mg twice daily      Synthia Innocent NP [facility]  Contact [phone] Monday through Friday 8am- 5pm  After hours call [phone]

## 2014-09-25 ENCOUNTER — Encounter (HOSPITAL_COMMUNITY)
Admission: RE | Admit: 2014-09-25 | Discharge: 2014-09-25 | Disposition: A | Discharge status: Home / Self Care | Payer: Medicare Other | Source: Ambulatory Visit | Attending: Nephrology | Admitting: Nephrology

## 2014-09-25 ENCOUNTER — Encounter (HOSPITAL_COMMUNITY): Payer: Self-pay

## 2014-09-25 DIAGNOSIS — E119 Type 2 diabetes mellitus without complications: Secondary | ICD-10-CM | POA: Insufficient documentation

## 2014-09-25 DIAGNOSIS — R4701 Aphasia: Secondary | ICD-10-CM | POA: Diagnosis not present

## 2014-09-25 DIAGNOSIS — I10 Essential (primary) hypertension: Secondary | ICD-10-CM | POA: Insufficient documentation

## 2014-09-25 DIAGNOSIS — E779 Disorder of glycoprotein metabolism, unspecified: Secondary | ICD-10-CM | POA: Insufficient documentation

## 2014-09-25 DIAGNOSIS — I4891 Unspecified atrial fibrillation: Secondary | ICD-10-CM | POA: Diagnosis not present

## 2014-09-25 DIAGNOSIS — F17200 Nicotine dependence, unspecified, uncomplicated: Secondary | ICD-10-CM | POA: Insufficient documentation

## 2014-09-25 DIAGNOSIS — R944 Abnormal results of kidney function studies: Secondary | ICD-10-CM | POA: Diagnosis not present

## 2014-09-25 MED ORDER — SODIUM CHLORIDE 0.9 % IV SOLN
1.0000 mg/kg | INTRAVENOUS | Status: DC
  Administered 2014-09-25: 65 mg via INTRAVENOUS
  Filled 2014-09-25: qty 13

## 2014-09-25 MED ORDER — ACETAMINOPHEN 160 MG/5ML PO SOLN
500.0000 mg | ORAL | Status: DC
  Administered 2014-09-25: 500 mg via ORAL
  Filled 2014-09-25: qty 20.3

## 2014-09-25 MED ORDER — SODIUM CHLORIDE 0.9 % IV SOLN
INTRAVENOUS | Status: DC
  Administered 2014-09-25: 09:00:00 via INTRAVENOUS

## 2014-09-25 NOTE — Discharge Instructions (Signed)
Agalsidase Beta injection °What is this medicine? °AGALSIDASE BETA is used to replace an enzyme that is missing in patients with Fabry disease. It is not a cure. °This medicine may be used for other purposes; ask your health care provider or pharmacist if you have questions. °COMMON BRAND NAME(S): Fabrazyme °What should I tell my health care provider before I take this medicine? °They need to know if you have any of these conditions: °-heart disease °-an unusual or allergic reaction to agalsidase beta, mannitol, other medicines, foods, dyes, or preservatives °-pregnant or trying to get pregnant °-breast-feeding °How should I use this medicine? °This medicine is for infusion into a vein. It is given by a health care professional in a hospital or clinic setting. °Talk to your pediatrician regarding the use of this medicine in children. Special care may be needed. °Overdosage: If you think you have taken too much of this medicine contact a poison control center or emergency room at once. °NOTE: This medicine is only for you. Do not share this medicine with others. °What if I miss a dose? °It is important not to miss your dose. Call your doctor or health care professional if you are unable to keep an appointment. °What may interact with this medicine? °-amiodarone °-chloroquine °-gentamicin °-hydroxychloroquine °-monobenzone °This list may not describe all possible interactions. Give your health care provider a list of all the medicines, herbs, non-prescription drugs, or dietary supplements you use. Also tell them if you smoke, drink alcohol, or use illegal drugs. Some items may interact with your medicine. °What should I watch for while using this medicine? °Visit your doctor or health care professional for regular checks on your progress. Tell your doctor or healthcare professional if your symptoms do not start to get better or if they get worse. °There is a registry for patients with Fabry disease. The registry is  used to gather information about the disease and its effects. Talk to your health care provider if you would like to join the registry. °What side effects may I notice from receiving this medicine? °Side effects that you should report to your doctor or health care professional as soon as possible: °-allergic reactions like skin rash, itching or hives, swelling of the face, lips, or tongue °-breathing problems °-chest pain, tightness °-depression °-dizziness °-fast, irregular heart beat °-swelling of the arms or legs °Side effects that usually do not require medical attention (report to your doctor or health care professional if they continue or are bothersome): °-aches or pains °-anxiety °-fever or chills at the time of injection °-headache °-nausea, vomiting °-stomach pain, upset °This list may not describe all possible side effects. Call your doctor for medical advice about side effects. You may report side effects to FDA at 1-800-FDA-1088. °Where should I keep my medicine? °This drug is given in a hospital or clinic and will not be stored at home. °NOTE: This sheet is a summary. It may not cover all possible information. If you have questions about this medicine, talk to your doctor, pharmacist, or health care provider. °© 2015, [employer]vier/[employer]. (2005-11-13 12:25:00) ° °

## 2014-10-09 ENCOUNTER — Encounter (HOSPITAL_COMMUNITY)
Admission: RE | Admit: 2014-10-09 | Discharge: 2014-10-09 | Disposition: A | Discharge status: Home / Self Care | Payer: Medicare Other | Source: Ambulatory Visit | Attending: Nephrology | Admitting: Nephrology

## 2014-10-09 DIAGNOSIS — E779 Disorder of glycoprotein metabolism, unspecified: Secondary | ICD-10-CM | POA: Diagnosis not present

## 2014-10-09 MED ORDER — SODIUM CHLORIDE 0.9 % IV SOLN
INTRAVENOUS | Status: DC
  Administered 2014-10-09: 250 mL via INTRAVENOUS

## 2014-10-09 MED ORDER — ACETAMINOPHEN 160 MG/5ML PO SOLN
500.0000 mg | ORAL | Status: DC
  Administered 2014-10-09: 500 mg via ORAL
  Filled 2014-10-09: qty 20.3

## 2014-10-09 MED ORDER — SODIUM CHLORIDE 0.9 % IV SOLN
1.0000 mg/kg | INTRAVENOUS | Status: DC
  Administered 2014-10-09: 65 mg via INTRAVENOUS
  Filled 2014-10-09: qty 13

## 2014-10-09 NOTE — Progress Notes (Signed)
Uneventful infusion of Fabrazyme. Pt had moderated amount of soft stool and urine in DEPENDS . Pt was cleaned and fresh Depends applied. [facility] has been called for transport back to [facility]

## 2014-10-09 NOTE — Progress Notes (Signed)
Medications and diet (mechanical soft) reviewed with Tracey Harries caregiver at [facility]

## 2014-10-09 NOTE — Progress Notes (Signed)
Pt arrived today from [facility] per PTAR. Pt is alert and resonsive to nurses caring for her for her Fabrazyme infusion today. Pt is aphasic but will answer yes/no question. Confirms her name and birthday and pain scale in this manner. Pt transferred to stretcher. Pt has a Depends in place and several have been sent with her. Peg tube in place and no residual noted so per infusion Tylenol administered as ordered.Pt warm and dry.Dozing at intervals but easliy awakens upon voice command

## 2014-10-22 NOTE — Progress Notes (Signed)
Spoke with [place] ( nurse at [facility] where patient resides) to remind her of this patient's appointment on 10/23/14 at 0800. We asked for a current weight, which [place] said she will send with the patient, and adult diapers , which patient is still wearing due to incontinence. [place] verbalized understanding

## 2014-10-23 ENCOUNTER — Encounter (HOSPITAL_COMMUNITY)
Admission: RE | Admit: 2014-10-23 | Discharge: 2014-10-23 | Disposition: A | Discharge status: Home / Self Care | Payer: Medicare Other | Source: Ambulatory Visit | Attending: Nephrology | Admitting: Nephrology

## 2014-10-23 DIAGNOSIS — F17200 Nicotine dependence, unspecified, uncomplicated: Secondary | ICD-10-CM | POA: Insufficient documentation

## 2014-10-23 DIAGNOSIS — R944 Abnormal results of kidney function studies: Secondary | ICD-10-CM | POA: Insufficient documentation

## 2014-10-23 DIAGNOSIS — R4701 Aphasia: Secondary | ICD-10-CM | POA: Insufficient documentation

## 2014-10-23 DIAGNOSIS — I10 Essential (primary) hypertension: Secondary | ICD-10-CM | POA: Insufficient documentation

## 2014-10-23 DIAGNOSIS — E119 Type 2 diabetes mellitus without complications: Secondary | ICD-10-CM | POA: Insufficient documentation

## 2014-10-23 DIAGNOSIS — E779 Disorder of glycoprotein metabolism, unspecified: Secondary | ICD-10-CM | POA: Insufficient documentation

## 2014-10-23 DIAGNOSIS — I4891 Unspecified atrial fibrillation: Secondary | ICD-10-CM | POA: Insufficient documentation

## 2014-10-29 ENCOUNTER — Non-Acute Institutional Stay (SKILLED_NURSING_FACILITY): Payer: Medicare Other | Admitting: Adult Health

## 2014-10-29 DIAGNOSIS — R4701 Aphasia: Secondary | ICD-10-CM | POA: Diagnosis not present

## 2014-10-29 DIAGNOSIS — I639 Cerebral infarction, unspecified: Secondary | ICD-10-CM | POA: Diagnosis not present

## 2014-10-29 DIAGNOSIS — I482 Chronic atrial fibrillation, unspecified: Secondary | ICD-10-CM

## 2014-10-29 DIAGNOSIS — I10 Essential (primary) hypertension: Secondary | ICD-10-CM | POA: Diagnosis not present

## 2014-10-29 DIAGNOSIS — E7521 Fabry (-Anderson) disease: Secondary | ICD-10-CM | POA: Diagnosis not present

## 2014-10-29 DIAGNOSIS — E785 Hyperlipidemia, unspecified: Secondary | ICD-10-CM

## 2014-11-03 ENCOUNTER — Non-Acute Institutional Stay (SKILLED_NURSING_FACILITY): Payer: Medicare Other | Admitting: Adult Health

## 2014-11-03 ENCOUNTER — Encounter: Payer: Self-pay | Admitting: Adult Health

## 2014-11-03 DIAGNOSIS — D6489 Other specified anemias: Secondary | ICD-10-CM | POA: Diagnosis not present

## 2014-11-03 NOTE — Progress Notes (Signed)
Patient ID: Rebecca [name], female   DOB: 1945-07-26, 69 y.o.   MRN: [id number]    Facility: [facility]      No Known Allergies  Chief Complaint  Patient presents with  . Acute Visit    follow up labs     HPI:  She has had a drop in her hgb from 12.4 to 10.6. She is presently on eliquis 5 mg twice daily and asa 81 mg daily for afib and cardio-embolic stroke. There are no reports of changes in her stool. She is unable to fully participate in the hpi or ros; but states that she feels good.    Past Medical History  Diagnosis Date  . Hypertension   . 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 Expressive aphasia 03/23/2013  . Atrial fibrillation 05/01/2013  . Tobacco abuse 05/01/2013  . Diabetes mellitus without complication   . Depression   . Stroke     2015  . Stroke 2016    aphasia    Past Surgical History  Procedure Laterality Date  . Tee without cardioversion N/A 03/26/2013    Procedure: TRANSESOPHAGEAL ECHOCARDIOGRAM (TEE);  Surgeon: Thurmon Fair, MD;  Location: [facility] ENDOSCOPY;  Service: Cardiovascular;  Laterality: N/A;  . Cesarean section      x3  . Hip replacement Right 1990's  . Hip replacemet Left 1990's  . Laparoscopic gastrostomy N/A 04/20/2014    Procedure: LAPAROSCOPIC GASTROSTOMY TUBE PLACEMENT;  Surgeon: Axel Filler, MD;  Location: [facility] OR;  Service: General;  Laterality: N/A;  . Peg tube placemnt  04/15/14    VITAL SIGNS BP 115/66 mmHg  Pulse 60  Ht 5\' 4"  (1.626 m)  Wt 142 lb (64.411 kg)  BMI 24.36 kg/m2  SpO2 92%  Patient's Medications  New Prescriptions   No medications on file  Previous Medications   APIXABAN (ELIQUIS) 5 MG TABS TABLET    Take 1 tablet (5 mg total) by mouth 2 (two) times daily.   ASPIRIN EC 81 MG TABLET    Take 81 mg by mouth daily.   ATORVASTATIN (LIPITOR) 40 MG TABLET    Take 1 tablet (40 mg total) by mouth daily at 6 PM.   CLONIDINE (CATAPRES - DOSED IN MG/24 HR) 0.1 MG/24HR PATCH    Place 0.1 mg onto the skin every  Saturday.   LISINOPRIL (PRINIVIL,ZESTRIL) 5 MG TABLET    Take 5 mg by mouth daily with breakfast.    LORATADINE (CLARITIN) 10 MG TABLET    Take 10 mg by mouth daily with breakfast.   METOPROLOL TARTRATE (LOPRESSOR) 25 MG TABLET    Take 0.5 tablets (12.5 mg total) by mouth 2 (two) times daily.   MULTIPLE VITAMIN (MULTIVITAMIN) LIQD    Take 5 mLs by mouth daily with breakfast.   WATER FOR IRRIGATION, STERILE (FREE WATER) SOLN    Place 100 mLs into feeding tube every 4 (four) hours.  Modified Medications   No medications on file  Discontinued Medications   No medications on file     SIGNIFICANT DIAGNOSTIC EXAMS  07-10-14: right neck ultrasound: no abnormality identified in the area of concern in the right submandibular region   LABS REVIEWED:   04-26-14: glucose 138; bun 25; creat 0.97; k+4.5; na++140 05-01-14: wbc 5.1; hgb 12.1; hct 34.4; plt 231  06-05-14: chol 112; ldl 51; trig 83; hdl 45  06-22-14: urine culture: citrobacter koseri: cipro  10-30-14: wbc 3.5; hgb 10.6; hct 35.2; mcv 86.8; plt 196; glucose 77; bun 21.5; creat 0.72; k+4.1;  na++142; liver normal albumin 3.1 chol 143; ldl 73; trig 46; hdl 61      Review of Systems Unable to perform ROS: Patient nonverbal    Physical Exam Constitutional: No distress.  Eyes: Conjunctivae are normal.  Neck: Neck supple. No JVD present. No thyromegaly present.  Cardiovascular: Normal rate, regular rhythm and intact distal pulses.   Respiratory: Effort normal and breath sounds normal. No respiratory distress. She has no wheezes.  GI: Soft. Bowel sounds are normal. She exhibits no distension. There is no tenderness.  Has peg tube   Musculoskeletal: She exhibits no edema.  Able to move all extremities   Lymphadenopathy:    She has no cervical adenopathy.  Neurological: She is alert.  Skin: Skin is warm and dry. She is not diaphoretic.  Psychiatric: She has a normal mood and affect.     ASSESSMENT/ PLAN:  1. Anemia: she has had a  nearly 2 gm drop in her hgb. Feb there were no indications of her being dehydrated. Will hold her asa at this time; will have nursing staff guaiac her stools x3 and report and will repeat her cbc; will monitor her status    Synthia Innocent NP [facility]  Contact [phone] Monday through Friday 8am- 5pm  After hours call [phone]

## 2014-11-03 NOTE — Progress Notes (Signed)
Patient ID: Rebecca [name], female   DOB: 11-06-1945, 69 y.o.   MRN: [id number]    Facility: [facility]      No Known Allergies  Chief Complaint  Patient presents with  . Medical Management of Chronic Issues    HPI:  She is a long term resident of this facility being seen for the management of her chronic illnesses. Overall she is doing well. She does not use her feeding tube for nutritional support at this time. She cannot fully participate in the hpi or ros. There are no nursing concerns at this time.    Past Medical History  Diagnosis Date  . Hypertension   . 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 Expressive aphasia 03/23/2013  . Atrial fibrillation 05/01/2013  . Tobacco abuse 05/01/2013  . Diabetes mellitus without complication   . Depression   . Stroke     2015  . Stroke 2016    aphasia    Past Surgical History  Procedure Laterality Date  . Tee without cardioversion N/A 03/26/2013    Procedure: TRANSESOPHAGEAL ECHOCARDIOGRAM (TEE);  Surgeon: Thurmon Fair, MD;  Location: [facility] ENDOSCOPY;  Service: Cardiovascular;  Laterality: N/A;  . Cesarean section      x3  . Hip replacement Right 1990's  . Hip replacemet Left 1990's  . Laparoscopic gastrostomy N/A 04/20/2014    Procedure: LAPAROSCOPIC GASTROSTOMY TUBE PLACEMENT;  Surgeon: Axel Filler, MD;  Location: [facility] OR;  Service: General;  Laterality: N/A;  . Peg tube placemnt  04/15/14    VITAL SIGNS BP 122/62 mmHg  Pulse 63  Ht  (1.626 m)  Wt 142 lb (64.411 kg)  BMI 24.36 kg/m2  SpO2 99%  Patient's Medications  New Prescriptions   No medications on file  Previous Medications   APIXABAN (ELIQUIS) 5 MG TABS TABLET    Take 1 tablet (5 mg total) by mouth 2 (two) times daily.   ASPIRIN EC 81 MG TABLET    Take 81 mg by mouth daily.   ATORVASTATIN (LIPITOR) 40 MG TABLET    Take 1 tablet (40 mg total) by mouth daily at 6 PM.   CLONIDINE (CATAPRES - DOSED IN MG/24 HR) 0.1 MG/24HR PATCH    Place 0.1 mg onto the  skin every Saturday.   LISINOPRIL (PRINIVIL,ZESTRIL) 5 MG TABLET    Take 5 mg by mouth daily with breakfast.    LORATADINE (CLARITIN) 10 MG TABLET    Take 10 mg by mouth daily with breakfast.   METOPROLOL TARTRATE (LOPRESSOR) 25 MG TABLET    Take 0.5 tablets (12.5 mg total) by mouth 2 (two) times daily.   MULTIPLE VITAMIN (MULTIVITAMIN) LIQD    Take 5 mLs by mouth daily with breakfast.   WATER FOR IRRIGATION, STERILE (FREE WATER) SOLN    Place 100 mLs into feeding tube every 4 (four) hours.  Modified Medications   No medications on file  Discontinued Medications   ESCITALOPRAM (LEXAPRO) 5 MG TABLET    Take 1 tablet (5 mg total) by mouth daily.   NUTRITIONAL SUPPLEMENTS (FEEDING SUPPLEMENT, JEVITY 1.5 CAL,) LIQD    Place 1,000 mLs into feeding tube every 12 (twelve) hours.     SIGNIFICANT DIAGNOSTIC EXAMS  07-10-14: right neck ultrasound: no abnormality identified in the area of concern in the right submandibular region   LABS REVIEWED:   04-26-14: glucose 138; bun 25; creat 0.97; k+4.5; na++140 05-01-14: wbc 5.1; hgb 12.1; hct 34.4; plt 231  06-05-14: chol 112; ldl 51; trig 83;  hdl 45  06-22-14: urine culture: citrobacter koseri: cipro     Review of Systems  Unable to perform ROS: Patient nonverbal      Physical Exam  Constitutional: No distress.  Eyes: Conjunctivae are normal.  Neck: Neck supple. No JVD present. No thyromegaly present.  Cardiovascular: Normal rate, regular rhythm and intact distal pulses.   Respiratory: Effort normal and breath sounds normal. No respiratory distress. She has no wheezes.  GI: Soft. Bowel sounds are normal. She exhibits no distension. There is no tenderness.  Has peg tube   Musculoskeletal: She exhibits no edema.  Able to move all extremities   Lymphadenopathy:    She has no cervical adenopathy.  Neurological: She is alert.  Skin: Skin is warm and dry. She is not diaphoretic.  Psychiatric: She has a normal mood and affect.        ASSESSMENT/ PLAN:  1. 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 Dysphagia: no signs of aspiration present;  Does require peg tube feedings. will continue on thin liquids with regular diet and will monitor   3. Afib: heart rate is regular; will continue lopressor 12.5 mg twice daily for rate control; will continue eliquis 5 mg twice daily and asa 81 mg daily   4. Dyslipidemia: will continue lipitor 40 mg daily  ldl is 51   5. Hypertension: is stable will continue lisinopril 5 mg daily and lopressor 12.5 mg twice daily and clonidine 0.1 mg patch changed weekly   6. Depression: her lexapro has been stopped; will not make changes will monitor   7. Allergic rhinitis: will continue claritin 10 mg daily   8. CVA: has aphasia:  is neurologically without change; will continue asa 81 mg daily eliquis 5 mg twice daily    Will check cbc; cmp; lipids     Synthia Innocent NP [facility]  Contact [phone] Monday through Friday 8am- 5pm  After hours call [phone]

## 2014-11-06 ENCOUNTER — Ambulatory Visit (HOSPITAL_COMMUNITY)
Admission: RE | Admit: 2014-11-06 | Discharge: 2014-11-06 | Disposition: A | Discharge status: Home / Self Care | Payer: Medicare Other | Source: Ambulatory Visit | Attending: Nephrology | Admitting: Nephrology

## 2014-11-06 ENCOUNTER — Encounter (HOSPITAL_COMMUNITY): Payer: Self-pay

## 2014-11-06 DIAGNOSIS — E7521 Fabry (-Anderson) disease: Secondary | ICD-10-CM | POA: Diagnosis not present

## 2014-11-06 DIAGNOSIS — I69320 Aphasia following cerebral infarction: Secondary | ICD-10-CM | POA: Diagnosis not present

## 2014-11-06 MED ORDER — SODIUM CHLORIDE 0.9 % IV SOLN
1.0000 mg/kg | INTRAVENOUS | Status: DC
Start: 2014-11-06 — End: 2014-11-07
  Administered 2014-11-06: 65 mg via INTRAVENOUS
  Filled 2014-11-06: qty 13

## 2014-11-06 MED ORDER — ACETAMINOPHEN 160 MG/5ML PO SOLN
500.0000 mg | ORAL | Status: DC
  Administered 2014-11-06: 500 mg via ORAL
  Filled 2014-11-06: qty 20.3

## 2014-11-06 MED ORDER — SODIUM CHLORIDE 0.9 % IV SOLN
INTRAVENOUS | Status: DC
  Administered 2014-11-06: 250 mL via INTRAVENOUS

## 2014-11-06 NOTE — Progress Notes (Signed)
Uneventful infusion of Fabrazyme. Adult diaper dray . Pt assisted to w/c via pivot and 2 person without problem

## 2014-11-06 NOTE — Progress Notes (Addendum)
Pt arrived today from [facility] via w/c. Was able to stand and pivot to stretcher for her Fabrazyme infusion. Pt is till having aphasia but is answering yes/no questions and acknowledges why she is here, her name and birthdate. Peg tube in place and no residual. Tylenol 500mg  elixer given via peg and flushed with 30 ml water. Pt has an adult diaper in place

## 2014-11-20 ENCOUNTER — Encounter (HOSPITAL_COMMUNITY)
Admission: RE | Admit: 2014-11-20 | Discharge: 2014-11-20 | Disposition: A | Discharge status: Home / Self Care | Payer: Medicare Other | Source: Ambulatory Visit | Attending: Nephrology | Admitting: Nephrology

## 2014-11-20 ENCOUNTER — Encounter (HOSPITAL_COMMUNITY): Payer: Self-pay

## 2014-11-20 DIAGNOSIS — R944 Abnormal results of kidney function studies: Secondary | ICD-10-CM | POA: Insufficient documentation

## 2014-11-20 DIAGNOSIS — I10 Essential (primary) hypertension: Secondary | ICD-10-CM | POA: Insufficient documentation

## 2014-11-20 DIAGNOSIS — E779 Disorder of glycoprotein metabolism, unspecified: Secondary | ICD-10-CM | POA: Diagnosis present

## 2014-11-20 DIAGNOSIS — R4701 Aphasia: Secondary | ICD-10-CM | POA: Diagnosis not present

## 2014-11-20 DIAGNOSIS — I4891 Unspecified atrial fibrillation: Secondary | ICD-10-CM | POA: Insufficient documentation

## 2014-11-20 DIAGNOSIS — F17200 Nicotine dependence, unspecified, uncomplicated: Secondary | ICD-10-CM | POA: Insufficient documentation

## 2014-11-20 DIAGNOSIS — E119 Type 2 diabetes mellitus without complications: Secondary | ICD-10-CM | POA: Insufficient documentation

## 2014-11-20 MED ORDER — SODIUM CHLORIDE 0.9 % IV SOLN
INTRAVENOUS | Status: DC
  Administered 2014-11-20: 09:00:00 via INTRAVENOUS

## 2014-11-20 MED ORDER — ACETAMINOPHEN 160 MG/5ML PO SOLN
500.0000 mg | ORAL | Status: DC
  Administered 2014-11-20: 500 mg
  Filled 2014-11-20: qty 20.3

## 2014-11-20 MED ORDER — SODIUM CHLORIDE 0.9 % IV SOLN
1.0000 mg/kg | INTRAVENOUS | Status: DC
  Administered 2014-11-20: 65 mg via INTRAVENOUS
  Filled 2014-11-20: qty 13

## 2014-11-20 NOTE — Discharge Instructions (Signed)
Agalsidase Beta injection °What is this medicine? °AGALSIDASE BETA is used to replace an enzyme that is missing in patients with Fabry disease. It is not a cure. °This medicine may be used for other purposes; ask your health care provider or pharmacist if you have questions. °COMMON BRAND NAME(S): Fabrazyme °What should I tell my health care provider before I take this medicine? °They need to know if you have any of these conditions: °-heart disease °-an unusual or allergic reaction to agalsidase beta, mannitol, other medicines, foods, dyes, or preservatives °-pregnant or trying to get pregnant °-breast-feeding °How should I use this medicine? °This medicine is for infusion into a vein. It is given by a health care professional in a hospital or clinic setting. °Talk to your pediatrician regarding the use of this medicine in children. Special care may be needed. °Overdosage: If you think you have taken too much of this medicine contact a poison control center or emergency room at once. °NOTE: This medicine is only for you. Do not share this medicine with others. °What if I miss a dose? °It is important not to miss your dose. Call your doctor or health care professional if you are unable to keep an appointment. °What may interact with this medicine? °-amiodarone °-chloroquine °-gentamicin °-hydroxychloroquine °-monobenzone °This list may not describe all possible interactions. Give your health care provider a list of all the medicines, herbs, non-prescription drugs, or dietary supplements you use. Also tell them if you smoke, drink alcohol, or use illegal drugs. Some items may interact with your medicine. °What should I watch for while using this medicine? °Visit your doctor or health care professional for regular checks on your progress. Tell your doctor or healthcare professional if your symptoms do not start to get better or if they get worse. °There is a registry for patients with Fabry disease. The registry is  used to gather information about the disease and its effects. Talk to your health care provider if you would like to join the registry. °What side effects may I notice from receiving this medicine? °Side effects that you should report to your doctor or health care professional as soon as possible: °-allergic reactions like skin rash, itching or hives, swelling of the face, lips, or tongue °-breathing problems °-chest pain, tightness °-depression °-dizziness °-fast, irregular heart beat °-swelling of the arms or legs °Side effects that usually do not require medical attention (report to your doctor or health care professional if they continue or are bothersome): °-aches or pains °-anxiety °-fever or chills at the time of injection °-headache °-nausea, vomiting °-stomach pain, upset °This list may not describe all possible side effects. Call your doctor for medical advice about side effects. You may report side effects to FDA at 1-800-FDA-1088. °Where should I keep my medicine? °This drug is given in a hospital or clinic and will not be stored at home. °NOTE: This sheet is a summary. It may not cover all possible information. If you have questions about this medicine, talk to your doctor, pharmacist, or health care provider. °© 2015, [employer]vier/[employer]. (2005-11-13 12:25:00) ° °

## 2014-11-20 NOTE — Progress Notes (Signed)
Uneventful fabrazyme infusion today.  Attends changed twice while here today.  Cleaned pt and changed attends just before d/c back to [facility].  Driver from [facility] arrived to floor to take pt back to nursing facility via wheelchair.  Pt was able to stand and pivot with assistance to wheelchair.

## 2014-12-04 ENCOUNTER — Encounter (HOSPITAL_COMMUNITY): Payer: Self-pay

## 2014-12-04 ENCOUNTER — Encounter (HOSPITAL_COMMUNITY)
Admission: RE | Admit: 2014-12-04 | Discharge: 2014-12-04 | Disposition: A | Discharge status: Home / Self Care | Payer: Medicare Other | Source: Ambulatory Visit | Attending: Nephrology | Admitting: Nephrology

## 2014-12-04 DIAGNOSIS — E779 Disorder of glycoprotein metabolism, unspecified: Secondary | ICD-10-CM | POA: Diagnosis not present

## 2014-12-04 MED ORDER — ACETAMINOPHEN 160 MG/5ML PO SOLN
500.0000 mg | ORAL | Status: DC
  Administered 2014-12-04: 500 mg
  Filled 2014-12-04: qty 20.3

## 2014-12-04 MED ORDER — SODIUM CHLORIDE 0.9 % IV SOLN
INTRAVENOUS | Status: DC
  Administered 2014-12-04: 09:00:00 via INTRAVENOUS

## 2014-12-04 MED ORDER — SODIUM CHLORIDE 0.9 % IV SOLN
1.0000 mg/kg | INTRAVENOUS | Status: DC
  Administered 2014-12-04: 65 mg via INTRAVENOUS
  Filled 2014-12-04: qty 7

## 2014-12-04 NOTE — Progress Notes (Signed)
Uneventful infusion of Fabrazyme today. Pt was able to stand and pivot from w/c to stretcher with the assistance of 2 staff members. Depends changed at the end of the infusion , no urine or stool notes. Assisted back to w/c pivoting with assistance of 2 staff members and awaiting transportation back to [facility] where she resides

## 2014-12-04 NOTE — Progress Notes (Signed)
Rebecca [name], facility driver, here to get pt and take back to [facility].

## 2014-12-08 ENCOUNTER — Non-Acute Institutional Stay (SKILLED_NURSING_FACILITY): Payer: Medicare Other | Admitting: Adult Health

## 2014-12-08 DIAGNOSIS — I639 Cerebral infarction, unspecified: Secondary | ICD-10-CM | POA: Diagnosis not present

## 2014-12-08 DIAGNOSIS — E1140 Type 2 diabetes mellitus with diabetic neuropathy, unspecified: Secondary | ICD-10-CM

## 2014-12-08 DIAGNOSIS — I482 Chronic atrial fibrillation, unspecified: Secondary | ICD-10-CM

## 2014-12-08 DIAGNOSIS — I10 Essential (primary) hypertension: Secondary | ICD-10-CM | POA: Diagnosis not present

## 2014-12-08 DIAGNOSIS — E1149 Type 2 diabetes mellitus with other diabetic neurological complication: Secondary | ICD-10-CM

## 2014-12-08 DIAGNOSIS — R4701 Aphasia: Secondary | ICD-10-CM

## 2014-12-08 DIAGNOSIS — I5043 Acute on chronic combined systolic (congestive) and diastolic (congestive) heart failure: Secondary | ICD-10-CM | POA: Diagnosis not present

## 2014-12-08 DIAGNOSIS — I69391 Dysphagia following cerebral infarction: Secondary | ICD-10-CM

## 2014-12-08 DIAGNOSIS — R4702 Dysphasia: Secondary | ICD-10-CM | POA: Diagnosis not present

## 2014-12-11 ENCOUNTER — Encounter: Payer: Self-pay | Admitting: Gastroenterology

## 2014-12-18 ENCOUNTER — Encounter (HOSPITAL_COMMUNITY): Payer: Self-pay

## 2014-12-18 ENCOUNTER — Encounter (HOSPITAL_COMMUNITY)
Admission: RE | Admit: 2014-12-18 | Discharge: 2014-12-18 | Disposition: A | Discharge status: Home / Self Care | Payer: Medicare Other | Source: Ambulatory Visit | Attending: Nephrology | Admitting: Nephrology

## 2014-12-18 DIAGNOSIS — E779 Disorder of glycoprotein metabolism, unspecified: Secondary | ICD-10-CM | POA: Diagnosis not present

## 2014-12-18 MED ORDER — ACETAMINOPHEN 160 MG/5ML PO SOLN
500.0000 mg | ORAL | Status: DC
  Administered 2014-12-18: 500 mg
  Filled 2014-12-18: qty 20.3

## 2014-12-18 MED ORDER — SODIUM CHLORIDE 0.9 % IV SOLN
INTRAVENOUS | Status: DC
  Administered 2014-12-18: 250 mL via INTRAVENOUS

## 2014-12-18 MED ORDER — AGALSIDASE BETA 5 MG IV SOLR
1.0000 mg/kg | INTRAVENOUS | Status: DC
  Administered 2014-12-18: 65 mg via INTRAVENOUS
  Filled 2014-12-18: qty 6

## 2014-12-18 NOTE — Progress Notes (Signed)
20 ml of clear yellowish liquid residual in PEG. Then Tylenol 15.6 ml equalling 500mg  given and flushed with 30 ml water. HOB is elevated to 45 degrees. Pt is resting comfortably

## 2014-12-18 NOTE — Progress Notes (Signed)
Uneventful infusion of Fabrazyme. Pt slept soundly during infusion but was awakened to get ready for discharge. Pt had voided incontinently x1 while here in Short Stay Fresh chux was placed on patient and her dried shorts. Pt was assisted to w/c , she was able to pivot with the assistance of 2 staff members.. Awaiting transportation back to [facility] where she resides

## 2014-12-18 NOTE — Progress Notes (Addendum)
Pt arrived today via w/c from [facility] for her [facility], awake alert and will answer questions in yes or no.Pt has a Depends in place but her shorts and Depends are saturated with urine. Thses were removed and replaced with incontinece pads and shorts hung to dry. .Able to pivot transfer to stretcher with 2 persons. Pt is warm and dry. Rebecca [name]

## 2015-01-01 ENCOUNTER — Encounter (HOSPITAL_COMMUNITY): Payer: Self-pay

## 2015-01-01 ENCOUNTER — Encounter (HOSPITAL_COMMUNITY)
Admission: RE | Admit: 2015-01-01 | Discharge: 2015-01-01 | Disposition: A | Discharge status: Home / Self Care | Payer: Medicare Other | Source: Ambulatory Visit | Attending: Nephrology | Admitting: Nephrology

## 2015-01-01 DIAGNOSIS — F17200 Nicotine dependence, unspecified, uncomplicated: Secondary | ICD-10-CM | POA: Diagnosis not present

## 2015-01-01 DIAGNOSIS — E119 Type 2 diabetes mellitus without complications: Secondary | ICD-10-CM | POA: Insufficient documentation

## 2015-01-01 DIAGNOSIS — I4891 Unspecified atrial fibrillation: Secondary | ICD-10-CM | POA: Insufficient documentation

## 2015-01-01 DIAGNOSIS — R4701 Aphasia: Secondary | ICD-10-CM | POA: Insufficient documentation

## 2015-01-01 DIAGNOSIS — R944 Abnormal results of kidney function studies: Secondary | ICD-10-CM | POA: Insufficient documentation

## 2015-01-01 DIAGNOSIS — E779 Disorder of glycoprotein metabolism, unspecified: Secondary | ICD-10-CM | POA: Insufficient documentation

## 2015-01-01 DIAGNOSIS — I10 Essential (primary) hypertension: Secondary | ICD-10-CM | POA: Diagnosis not present

## 2015-01-01 MED ORDER — SODIUM CHLORIDE 0.9 % IV SOLN
INTRAVENOUS | Status: DC
  Administered 2015-01-01: 08:00:00 via INTRAVENOUS

## 2015-01-01 MED ORDER — AGALSIDASE BETA 5 MG IV SOLR
1.0000 mg/kg | INTRAVENOUS | Status: DC
  Administered 2015-01-01: 65 mg via INTRAVENOUS
  Filled 2015-01-01: qty 7

## 2015-01-01 MED ORDER — ACETAMINOPHEN 160 MG/5ML PO SOLN
500.0000 mg | ORAL | Status: DC
Start: 2015-01-01 — End: 2015-01-02
  Administered 2015-01-01: 500 mg
  Filled 2015-01-01: qty 20.3

## 2015-01-01 NOTE — Discharge Instructions (Signed)
Fabrazyme °Agalsidase Beta injection °What is this medicine? °AGALSIDASE BETA is used to replace an enzyme that is missing in patients with Fabry disease. It is not a cure. °This medicine may be used for other purposes; ask your health care provider or pharmacist if you have questions. °What should I tell my health care provider before I take this medicine? °They need to know if you have any of these conditions: °-heart disease °-an unusual or allergic reaction to agalsidase beta, mannitol, other medicines, foods, dyes, or preservatives °-pregnant or trying to get pregnant °-breast-feeding °How should I use this medicine? °This medicine is for infusion into a vein. It is given by a health care professional in a hospital or clinic setting. °Talk to your pediatrician regarding the use of this medicine in children. Special care may be needed. °Overdosage: If you think you have taken too much of this medicine contact a poison control center or emergency room at once. °NOTE: This medicine is only for you. Do not share this medicine with others. °What if I miss a dose? °It is important not to miss your dose. Call your doctor or health care professional if you are unable to keep an appointment. °What may interact with this medicine? °-amiodarone °-chloroquine °-gentamicin °-hydroxychloroquine °-monobenzone °This list may not describe all possible interactions. Give your health care provider a list of all the medicines, herbs, non-prescription drugs, or dietary supplements you use. Also tell them if you smoke, drink alcohol, or use illegal drugs. Some items may interact with your medicine. °What should I watch for while using this medicine? °Visit your doctor or health care professional for regular checks on your progress. Tell your doctor or healthcare professional if your symptoms do not start to get better or if they get worse. °There is a registry for patients with Fabry disease. The registry is used to gather  information about the disease and its effects. Talk to your health care provider if you would like to join the registry. °What side effects may I notice from receiving this medicine? °Side effects that you should report to your doctor or health care professional as soon as possible: °-allergic reactions like skin rash, itching or hives, swelling of the face, lips, or tongue °-breathing problems °-chest pain, tightness °-depression °-dizziness °-fast, irregular heart beat °-swelling of the arms or legs °Side effects that usually do not require medical attention (report to your doctor or health care professional if they continue or are bothersome): °-aches or pains °-anxiety °-fever or chills at the time of injection °-headache °-nausea, vomiting °-stomach pain, upset °This list may not describe all possible side effects. Call your doctor for medical advice about side effects. You may report side effects to FDA at 1-800-FDA-1088. °Where should I keep my medicine? °This drug is given in a hospital or clinic and will not be stored at home. °NOTE: This sheet is a summary. It may not cover all possible information. If you have questions about this medicine, talk to your doctor, pharmacist, or health care provider. °  °© 2016, Elsevier/Gold Standard. (2005-11-13 12:25:00) ° °

## 2015-01-01 NOTE — Progress Notes (Signed)
Uneventful infusion of Fabrazyme. Pt is very sleepy today but will wake up and respond when her name is called. Pt arrived with dry Depends on and have just checked again and they are still currently dry. Awaiting transportation to arrive to transport back to [facility]

## 2015-01-04 ENCOUNTER — Encounter: Payer: Self-pay | Admitting: Internal Medicine

## 2015-01-04 ENCOUNTER — Non-Acute Institutional Stay (SKILLED_NURSING_FACILITY): Payer: Medicare Other | Admitting: Internal Medicine

## 2015-01-04 DIAGNOSIS — I10 Essential (primary) hypertension: Secondary | ICD-10-CM

## 2015-01-04 DIAGNOSIS — E785 Hyperlipidemia, unspecified: Secondary | ICD-10-CM

## 2015-01-04 DIAGNOSIS — E7521 Fabry (-Anderson) disease: Secondary | ICD-10-CM

## 2015-01-04 DIAGNOSIS — Z8673 Personal history of transient ischemic attack (TIA), and cerebral infarction without residual deficits: Secondary | ICD-10-CM

## 2015-01-04 DIAGNOSIS — Z931 Gastrostomy status: Secondary | ICD-10-CM | POA: Diagnosis not present

## 2015-01-04 DIAGNOSIS — D6489 Other specified anemias: Secondary | ICD-10-CM

## 2015-01-04 DIAGNOSIS — I480 Paroxysmal atrial fibrillation: Secondary | ICD-10-CM | POA: Diagnosis not present

## 2015-01-04 NOTE — Progress Notes (Signed)
Patient ID: Rebecca [name], female   DOB: 1945-08-11, 69 y.o.   MRN: [id number]    DATE: 01/04/15  Location:  [facility]    Place of Service: SNF [phone])   Extended Emergency Contact Information Primary Emergency Contact: Allie Dimmer [place] Home Phone: [phone] Relation: Son  Advanced Directive information   FULL CODE; MOST FORM ON CHART  Chief Complaint  Patient presents with  . Medical Management of Chronic Issues    HPI:  69 yo female long term resident seen today for f/u. She has no c/o. She is a poor historian due to expressive aphasia. Hx obtained from chart. Nursing noted redness around Peg site. No d/c, f/c. No falls. CXR last month showed mild cardiomegaly.  Labs reveal K+ 4.2, Na 144 and Cr 0.8. CBGs 130-170s   Fabry disease - stable and no change in status. Takes frabrazyme injection every 2 weeks    Dysphagia - stable with no signs of aspiration present;  S/p Peg and gets TF. She is on thin liquids with regular diet  Afib - rate controlled on lopressor 12.5 mg twice daily. Takes  eliquis 5 mg twice daily and asa 81 mg daily   Hyperlipidemia - stable on lipitor 40 mg daily. LDL 51   Hypertension - BP stable on lisinopril 5 mg daily and lopressor 12.5 mg twice daily and clonidine 0.1 mg patch changed weekly   Depression - mood stable off lexapro  Allergic rhinitis - stable on claritin 10 mg daily   Hx CVA - has expressive aphasia. Stable on ASA and eliquis   Past Medical History  Diagnosis Date  . Hypertension   . 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 Expressive aphasia 03/23/2013  . Atrial fibrillation (HCC) 05/01/2013  . Tobacco abuse 05/01/2013  . Diabetes mellitus without complication (HCC)   . Depression   . Stroke (HCC)     2015  . Stroke [facility]) 2016    aphasia    Past Surgical History  Procedure Laterality Date  . Tee without cardioversion N/A 03/26/2013    Procedure: TRANSESOPHAGEAL ECHOCARDIOGRAM (TEE);   Surgeon: Thurmon Fair, MD;  Location: [facility] ENDOSCOPY;  Service: Cardiovascular;  Laterality: N/A;  . Cesarean section      x3  . Hip replacement Right 1990's  . Hip replacemet Left 1990's  . Laparoscopic gastrostomy N/A 04/20/2014    Procedure: LAPAROSCOPIC GASTROSTOMY TUBE PLACEMENT;  Surgeon: Axel Filler, MD;  Location: [facility] OR;  Service: General;  Laterality: N/A;  . Peg tube placemnt  04/15/14    Patient Care Team: Burtis Junes, MD as PCP - General (Family Medicine)  Social History   Social History  . Marital Status: Single    Spouse Name: N/A  . Number of Children: N/A  . Years of Education: N/A   Occupational History  . Not on file.   Social History Main Topics  . Smoking status: Never Smoker   . Smokeless tobacco: Not on file  . Alcohol Use: No  . Drug Use: No  . Sexual Activity: Not Currently   Other Topics Concern  . Not on file   Social History Narrative     reports that she has never smoked. She does not have any smokeless tobacco history on file. She reports that she does not drink alcohol or use illicit drugs.   There is no immunization history on file for this patient.  No Known Allergies  Medications: Patient's Medications  New Prescriptions  No medications on file  Previous Medications   APIXABAN (ELIQUIS) 5 MG TABS TABLET    Take 1 tablet (5 mg total) by mouth 2 (two) times daily.   ASPIRIN EC 81 MG TABLET    Take 81 mg by mouth daily.   ATORVASTATIN (LIPITOR) 40 MG TABLET    Take 1 tablet (40 mg total) by mouth daily at 6 PM.   CLONIDINE (CATAPRES - DOSED IN MG/24 HR) 0.1 MG/24HR PATCH    Place 0.1 mg onto the skin every Saturday.   LISINOPRIL (PRINIVIL,ZESTRIL) 5 MG TABLET    Take 5 mg by mouth daily with breakfast.    LORATADINE (CLARITIN) 10 MG TABLET    Take 10 mg by mouth daily with breakfast.   METOPROLOL TARTRATE (LOPRESSOR) 25 MG TABLET    Take 0.5 tablets (12.5 mg total) by mouth 2 (two) times daily.   MULTIPLE VITAMIN  (MULTIVITAMIN) LIQD    Take 5 mLs by mouth daily with breakfast.   WATER FOR IRRIGATION, STERILE (FREE WATER) SOLN    Place 100 mLs into feeding tube every 4 (four) hours.  Modified Medications   No medications on file  Discontinued Medications   No medications on file    Review of Systems  Unable to perform ROS: Other  expressive aphasia  Filed Vitals:   01/04/15 1447  BP: 127/79  Pulse: 60  Temp: 97.4 F (36.3 C)  Weight: 139 lb (63.05 kg)  SpO2: 99%   Body mass index is 23.85 kg/(m^2).  Physical Exam  Constitutional: She appears well-developed. No distress.  Frail appearing in NAD, sitting in w/c  HENT:  Mouth/Throat: Oropharynx is clear and moist. No oropharyngeal exudate.  Eyes: Pupils are equal, round, and reactive to light. No scleral icterus.  Neck: Neck supple. Carotid bruit is not present. No tracheal deviation present. No thyromegaly present.  Cardiovascular: Normal rate, regular rhythm and intact distal pulses.  Exam reveals no gallop and no friction rub.   Murmur (1/6 SEM) heard. No LE edema b/l. no calf TTP. TEDs intact b/l  Pulmonary/Chest: Effort normal. No stridor. No respiratory distress. She has no wheezes. She has rales (right basilar inspiratory).  Abdominal: Soft. Bowel sounds are normal. She exhibits no distension and no mass. There is no tenderness. There is no rebound and no guarding.  (+) Peg intact without purulent d/c but min redness, NT at insertion site  Genitourinary:  Foley intact and DTG sediment noted but no gross blood  Lymphadenopathy:    She has no cervical adenopathy.  Neurological: She is alert. She displays atrophy.  Expressive aphasia  Skin: Skin is warm and dry. No rash noted.  Psychiatric: She has a normal mood and affect. Her behavior is normal.     Labs reviewed: No visits with results within 3 Month(s) from this visit. Latest known visit with results is:  Lab on 07/24/2014  Component Date Value Ref Range Status  .  Triglycerides 06/05/2014 83  40 - 160 mg/dL Final  . Cholesterol 46/96/2952 112  0 - 200 mg/dL Final  . HDL 84/13/2440 45  35 - 70 mg/dL Final  . LDL Cholesterol 06/05/2014 551   Final    No results found.   Assessment/Plan   ICD-9-CM ICD-10-CM   1. PEG (percutaneous endoscopic gastrostomy) status (HCC) V44.1 Z93.1   2. Essential hypertension 401.9 I10   3. Fabry disease (HCC) 272.7 E75.21   4. History of cardioembolic stroke V12.54 Z86.73    with expressive aphasia and  dysphagia  5. Paroxysmal atrial fibrillation (HCC) 427.31 I48.0   6. Hyperlipidemia 272.4 E78.5   7. Anemia due to other cause 285.8 D64.89     Cont current meds as ordered  Check A1c  Cont nutritional supplements as ordered  Cont q2 week fabrazyme infusions  F/u with specialists as scheduled  Will follow  Jaxston Chohan S. Ancil Linsey  [facility] [address] [place], [place] [address] [phone] Cell (Monday-Friday 8 AM - 5 PM) [phone] After 5 PM and follow prompts

## 2015-01-06 LAB — HEMOGLOBIN A1C: Hemoglobin A1C: 5.7

## 2015-01-12 ENCOUNTER — Encounter: Payer: Self-pay | Admitting: Adult Health

## 2015-01-12 DIAGNOSIS — E1149 Type 2 diabetes mellitus with other diabetic neurological complication: Secondary | ICD-10-CM | POA: Insufficient documentation

## 2015-01-12 MED ORDER — LISINOPRIL 5 MG PO TABS: 10.0000 mg | ORAL_TABLET | Freq: Every day | ORAL | Status: DC

## 2015-01-12 NOTE — Progress Notes (Signed)
Patient ID: Rebecca [name], female   DOB: December 23, 1945, 69 y.o.   MRN: [id number]   Facility: [facility]      No Known Allergies  Chief Complaint  Patient presents with  . Medical Management of Chronic Issues    HPI:  She is a long term resident of this facility being seen for the management of her chronic illnesses. Overall her status is without change. She is unable to fully participate in the hpi or ros. Her weight is currently 143 pounds. There are no nursing concerns at this time.     Past Medical History  Diagnosis Date  . Hypertension   . 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 Expressive aphasia 03/23/2013  . Atrial fibrillation (HCC) 05/01/2013  . Tobacco abuse 05/01/2013  . Diabetes mellitus without complication (HCC)   . Depression   . Stroke (HCC)     2015  . Stroke [facility]) 2016    aphasia    Past Surgical History  Procedure Laterality Date  . Tee without cardioversion N/A 03/26/2013    Procedure: TRANSESOPHAGEAL ECHOCARDIOGRAM (TEE);  Surgeon: Thurmon Fair, MD;  Location: [facility] ENDOSCOPY;  Service: Cardiovascular;  Laterality: N/A;  . Cesarean section      x3  . Hip replacement Right 1990's  . Hip replacemet Left 1990's  . Laparoscopic gastrostomy N/A 04/20/2014    Procedure: LAPAROSCOPIC GASTROSTOMY TUBE PLACEMENT;  Surgeon: Axel Filler, MD;  Location: [facility] OR;  Service: General;  Laterality: N/A;  . Peg tube placemnt  04/15/14    VITAL SIGNS BP 174/100 mmHg  Pulse 74  Ht 5\' 4"  (1.626 m)  Wt 143 lb (64.864 kg)  BMI 24.53 kg/m2  SpO2 91%  Patient's Medications  New Prescriptions   No medications on file  Previous Medications   APIXABAN (ELIQUIS) 5 MG TABS TABLET    Take 1 tablet (5 mg total) by mouth 2 (two) times daily.   ASPIRIN EC 81 MG TABLET    Take 81 mg by mouth daily.   ATORVASTATIN (LIPITOR) 40 MG TABLET    Take 1 tablet (40 mg total) by mouth daily at 6 PM.   CLONIDINE (CATAPRES - DOSED IN MG/24 HR) 0.1 MG/24HR PATCH    Place 0.1 mg  onto the skin every Saturday.   LISINOPRIL (PRINIVIL,ZESTRIL) 5 MG TABLET    Take 5 mg by mouth daily with breakfast.    LORATADINE (CLARITIN) 10 MG TABLET    Take 10 mg by mouth daily with breakfast.   METOPROLOL TARTRATE (LOPRESSOR) 25 MG TABLET    Take 0.5 tablets (12.5 mg total) by mouth 2 (two) times daily.   MULTIPLE VITAMIN (MULTIVITAMIN) LIQD    Take 5 mLs by mouth daily with breakfast.   WATER FOR IRRIGATION, STERILE (FREE WATER) SOLN    Place 100 mLs into feeding tube every 4 (four) hours.  Modified Medications   No medications on file  Discontinued Medications   No medications on file     SIGNIFICANT DIAGNOSTIC EXAMS  07-10-14: right neck ultrasound: no abnormality identified in the area of concern in the right submandibular region   11-27-14: chest x-ray: no acute cardiopulmonary process     LABS REVIEWED:   04-26-14: glucose 138; bun 25; creat 0.97; k+4.5; na++140 05-01-14: wbc 5.1; hgb 12.1; hct 34.4; plt 231  06-05-14: chol 112; ldl 51; trig 83; hdl 45  06-22-14: urine culture: citrobacter koseri: cipro  10-30-14: wbc 3.5; hgb 10.6; hct 35.2; mcv 86.8; plt 196; glucose 77; bun  21.5; creat 0.72; k+4.1; na++142; liver normal albumin 3.1 chol 143; ldl 73; trig 46; hdl 61 11-04-14: wbc 4.0; hgb 11.6; hct 40.7; mcv 89.9; plt 174      Review of Systems Unable to perform ROS: Patient nonverbal    Physical Exam Constitutional: No distress.  Eyes: Conjunctivae are normal.  Neck: Neck supple. No JVD present. No thyromegaly present.  Cardiovascular: Normal rate, regular rhythm and intact distal pulses.   Respiratory: Effort normal and breath sounds normal. No respiratory distress. She has no wheezes.  GI: Soft. Bowel sounds are normal. She exhibits no distension. There is no tenderness.  Has peg tube   Musculoskeletal: She exhibits no edema.  Able to move all extremities   Lymphadenopathy:    She has no cervical adenopathy.  Neurological: She is alert.  Skin: Skin is warm  and dry. She is not diaphoretic.  Psychiatric: She has a normal mood and affect.     ASSESSMENT/ PLAN:  1. 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 Dysphagia: no signs of aspiration present;   will continue on thin liquids with regular diet and will monitor   Is using peg tube for medications and supplements   3. Afib: heart rate is regular; will continue lopressor 12.5 mg twice daily for rate control; will continue eliquis 5 mg twice daily and asa 81 mg daily   4. Dyslipidemia: will continue lipitor 40 mg daily  ldl is 51   5. Hypertension: will continue lopressor 12.5 mg twice daily and clonidine 0.1 mg patch changed weekly will increase her lisinopril to 10 mg daily; will check blood pressure twice daily and check bmp in 2 weeks.   6. Depression: her lexapro has been stopped; will not make changes will monitor   7. Allergic rhinitis: will continue claritin 10 mg daily   8. CVA: has aphasia:  is neurologically without change; will continue asa 81 mg daily eliquis 5 mg twice daily     Synthia Innocent NP [facility] Adult Medicine  Contact [phone] Monday through Friday 8am- 5pm  After hours call [phone]

## 2015-01-15 ENCOUNTER — Encounter (HOSPITAL_COMMUNITY)
Admission: RE | Admit: 2015-01-15 | Discharge: 2015-01-15 | Disposition: A | Discharge status: Home / Self Care | Payer: Medicare Other | Source: Ambulatory Visit | Attending: Nephrology | Admitting: Nephrology

## 2015-01-15 ENCOUNTER — Encounter (HOSPITAL_COMMUNITY): Payer: Self-pay

## 2015-01-15 DIAGNOSIS — E779 Disorder of glycoprotein metabolism, unspecified: Secondary | ICD-10-CM | POA: Diagnosis not present

## 2015-01-15 MED ORDER — ACETAMINOPHEN 160 MG/5ML PO SOLN
500.0000 mg | ORAL | Status: DC
  Administered 2015-01-15: 500 mg
  Filled 2015-01-15: qty 20.3

## 2015-01-15 MED ORDER — SODIUM CHLORIDE 0.9 % IV SOLN
INTRAVENOUS | Status: DC
  Administered 2015-01-15: 250 mL via INTRAVENOUS

## 2015-01-15 MED ORDER — SODIUM CHLORIDE 0.9 % IV SOLN
1.0000 mg/kg | INTRAVENOUS | Status: DC
  Administered 2015-01-15: 65 mg via INTRAVENOUS
  Filled 2015-01-15: qty 13

## 2015-01-15 NOTE — Progress Notes (Addendum)
Pt arrived today from [facility] where she resides via transportation in w/c. Pt alert and oriented but aphasic. She does acknowledge her nurse ,name DOB and why she is here. Transferred to stretcher with pivoting and 2 staff memebers. Pt has a DEPENDS on that is saturated with urine. This was removed and perineal area cleansed and dry CHUX applied along with underpads. Still has PEG tube in place and no residual. Tylenol elixir given and flushed

## 2015-01-15 NOTE — Progress Notes (Signed)
Uneventful infusion of her FABRAZYME. Pt has voided incontinently x2 and placed and clean dry ATTENDS adult diaper on. Pt assisted back to w/c.

## 2015-01-29 ENCOUNTER — Encounter (HOSPITAL_COMMUNITY)
Admission: RE | Admit: 2015-01-29 | Discharge: 2015-01-29 | Disposition: A | Discharge status: Home / Self Care | Payer: Medicare Other | Source: Ambulatory Visit | Attending: Nephrology | Admitting: Nephrology

## 2015-01-29 ENCOUNTER — Encounter (HOSPITAL_COMMUNITY): Payer: Self-pay

## 2015-01-29 DIAGNOSIS — I10 Essential (primary) hypertension: Secondary | ICD-10-CM | POA: Insufficient documentation

## 2015-01-29 DIAGNOSIS — E779 Disorder of glycoprotein metabolism, unspecified: Secondary | ICD-10-CM | POA: Insufficient documentation

## 2015-01-29 DIAGNOSIS — E119 Type 2 diabetes mellitus without complications: Secondary | ICD-10-CM | POA: Diagnosis not present

## 2015-01-29 DIAGNOSIS — F17200 Nicotine dependence, unspecified, uncomplicated: Secondary | ICD-10-CM | POA: Insufficient documentation

## 2015-01-29 DIAGNOSIS — R4701 Aphasia: Secondary | ICD-10-CM | POA: Insufficient documentation

## 2015-01-29 DIAGNOSIS — I4891 Unspecified atrial fibrillation: Secondary | ICD-10-CM | POA: Diagnosis not present

## 2015-01-29 DIAGNOSIS — R944 Abnormal results of kidney function studies: Secondary | ICD-10-CM | POA: Diagnosis not present

## 2015-01-29 MED ORDER — SODIUM CHLORIDE 0.9 % IV SOLN
INTRAVENOUS | Status: DC
  Administered 2015-01-29: 09:00:00 via INTRAVENOUS

## 2015-01-29 MED ORDER — AGALSIDASE BETA 5 MG IV SOLR
1.0000 mg/kg | INTRAVENOUS | Status: DC
  Administered 2015-01-29: 65 mg via INTRAVENOUS
  Filled 2015-01-29: qty 6

## 2015-01-29 MED ORDER — ACETAMINOPHEN 160 MG/5ML PO SOLN
500.0000 mg | ORAL | Status: DC
  Administered 2015-01-29: 500 mg
  Filled 2015-01-29: qty 20.3

## 2015-01-29 NOTE — Discharge Instructions (Signed)
Agalsidase Beta injection °What is this medicine? °AGALSIDASE BETA is used to replace an enzyme that is missing in patients with Fabry disease. It is not a cure. °This medicine may be used for other purposes; ask your health care provider or pharmacist if you have questions. °What should I tell my health care provider before I take this medicine? °They need to know if you have any of these conditions: °-heart disease °-an unusual or allergic reaction to agalsidase beta, mannitol, other medicines, foods, dyes, or preservatives °-pregnant or trying to get pregnant °-breast-feeding °How should I use this medicine? °This medicine is for infusion into a vein. It is given by a health care professional in a hospital or clinic setting. °Talk to your pediatrician regarding the use of this medicine in children. Special care may be needed. °Overdosage: If you think you have taken too much of this medicine contact a poison control center or emergency room at once. °NOTE: This medicine is only for you. Do not share this medicine with others. °What if I miss a dose? °It is important not to miss your dose. Call your doctor or health care professional if you are unable to keep an appointment. °What may interact with this medicine? °-amiodarone °-chloroquine °-gentamicin °-hydroxychloroquine °-monobenzone °This list may not describe all possible interactions. Give your health care provider a list of all the medicines, herbs, non-prescription drugs, or dietary supplements you use. Also tell them if you smoke, drink alcohol, or use illegal drugs. Some items may interact with your medicine. °What should I watch for while using this medicine? °Visit your doctor or health care professional for regular checks on your progress. Tell your doctor or healthcare professional if your symptoms do not start to get better or if they get worse. °There is a registry for patients with Fabry disease. The registry is used to gather information about  the disease and its effects. Talk to your health care provider if you would like to join the registry. °What side effects may I notice from receiving this medicine? °Side effects that you should report to your doctor or health care professional as soon as possible: °-allergic reactions like skin rash, itching or hives, swelling of the face, lips, or tongue °-breathing problems °-chest pain, tightness °-depression °-dizziness °-fast, irregular heart beat °-swelling of the arms or legs °Side effects that usually do not require medical attention (report to your doctor or health care professional if they continue or are bothersome): °-aches or pains °-anxiety °-fever or chills at the time of injection °-headache °-nausea, vomiting °-stomach pain, upset °This list may not describe all possible side effects. Call your doctor for medical advice about side effects. You may report side effects to FDA at 1-800-FDA-1088. °Where should I keep my medicine? °This drug is given in a hospital or clinic and will not be stored at home. °NOTE: This sheet is a summary. It may not cover all possible information. If you have questions about this medicine, talk to your doctor, pharmacist, or health care provider. °  °© 2016, Elsevier/Gold Standard. (2005-11-13 12:25:00) ° °

## 2015-02-03 ENCOUNTER — Encounter: Payer: Self-pay | Admitting: Adult Health

## 2015-02-03 ENCOUNTER — Non-Acute Institutional Stay (SKILLED_NURSING_FACILITY): Payer: Medicare Other | Admitting: Adult Health

## 2015-02-03 DIAGNOSIS — I480 Paroxysmal atrial fibrillation: Secondary | ICD-10-CM

## 2015-02-03 DIAGNOSIS — I119 Hypertensive heart disease without heart failure: Secondary | ICD-10-CM | POA: Diagnosis not present

## 2015-02-03 DIAGNOSIS — I69391 Dysphagia following cerebral infarction: Secondary | ICD-10-CM

## 2015-02-03 DIAGNOSIS — E1149 Type 2 diabetes mellitus with other diabetic neurological complication: Secondary | ICD-10-CM

## 2015-02-03 DIAGNOSIS — E785 Hyperlipidemia, unspecified: Secondary | ICD-10-CM | POA: Diagnosis not present

## 2015-02-03 DIAGNOSIS — E7521 Fabry (-Anderson) disease: Secondary | ICD-10-CM | POA: Diagnosis not present

## 2015-02-03 DIAGNOSIS — I5043 Acute on chronic combined systolic (congestive) and diastolic (congestive) heart failure: Secondary | ICD-10-CM

## 2015-02-03 NOTE — Progress Notes (Signed)
Patient ID: Rebecca [name], female   DOB: 1946/03/07, 69 y.o.   MRN: [id number]   Facility:  [facility]      No Known Allergies  Chief Complaint  Patient presents with  . Medical Management of Chronic Issues    HPI:  She is a long term resident of this facility being seen for the management of her chronic illnesses. Overall her status remains without change. She is unable to fully participate in the hpi or ros. She has been treated for her peg tube site which was inflamed; is now better. There are no nursing concerns today.  Her weight in Sept was 142 pounds her current weight is 137 pounds.    Past Medical History  Diagnosis Date  . Hypertension   . 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 Expressive aphasia 03/23/2013  . Atrial fibrillation (HCC) 05/01/2013  . Tobacco abuse 05/01/2013  . Diabetes mellitus without complication (HCC)   . Depression   . Stroke (HCC)     2015  . Stroke [facility]) 2016    aphasia    Past Surgical History  Procedure Laterality Date  . Tee without cardioversion N/A 03/26/2013    Procedure: TRANSESOPHAGEAL ECHOCARDIOGRAM (TEE);  Surgeon: Thurmon Fair, MD;  Location: [facility] ENDOSCOPY;  Service: Cardiovascular;  Laterality: N/A;  . Cesarean section      x3  . Hip replacement Right 1990's  . Hip replacemet Left 1990's  . Laparoscopic gastrostomy N/A 04/20/2014    Procedure: LAPAROSCOPIC GASTROSTOMY TUBE PLACEMENT;  Surgeon: Axel Filler, MD;  Location: [facility] OR;  Service: General;  Laterality: N/A;  . Peg tube placemnt  04/15/14    VITAL SIGNS BP 127/79 mmHg  Pulse 63  Ht  (1.626 m)  Wt 137 lb (62.143 kg)  BMI 23.50 kg/m2  SpO2 99%  Patient's Medications  New Prescriptions   No medications on file  Previous Medications   APIXABAN (ELIQUIS) 5 MG TABS TABLET    Take 1 tablet (5 mg total) by mouth 2 (two) times daily.   ASPIRIN EC 81 MG TABLET    Take 81 mg by mouth daily.   ATORVASTATIN (LIPITOR) 40 MG TABLET    Take 1 tablet (40 mg total) by mouth  daily at 6 PM.   CLONIDINE (CATAPRES - DOSED IN MG/24 HR) 0.1 MG/24HR PATCH    Place 0.1 mg onto the skin every Saturday.   LISINOPRIL (PRINIVIL,ZESTRIL) 5 MG TABLET    Take 2 tablets (10 mg total) by mouth daily with breakfast.   LORATADINE (CLARITIN) 10 MG TABLET    Take 10 mg by mouth daily with breakfast.   METOPROLOL TARTRATE (LOPRESSOR) 25 MG TABLET    Take 0.5 tablets (12.5 mg total) by mouth 2 (two) times daily.   MULTIPLE VITAMIN (MULTIVITAMIN) LIQD    Take 5 mLs by mouth daily with breakfast.  Modified Medications   No medications on file  Discontinued Medications   WATER FOR IRRIGATION, STERILE (FREE WATER) SOLN    Place 100 mLs into feeding tube every 4 (four) hours.     SIGNIFICANT DIAGNOSTIC EXAMS   07-10-14: right neck ultrasound: no abnormality identified in the area of concern in the right submandibular region   11-27-14: chest x-ray: no acute cardiopulmonary process     LABS REVIEWED:   04-26-14: glucose 138; bun 25; creat 0.97; k+4.5; na++140 05-01-14: wbc 5.1; hgb 12.1; hct 34.4; plt 231  06-05-14: chol 112; ldl 51; trig 83; hdl 45  06-22-14: urine culture: citrobacter koseri:  cipro  10-30-14: wbc 3.5; hgb 10.6; hct 35.2; mcv 86.8; plt 196; glucose 77; bun 21.5; creat 0.72; k+4.1; na++142; liver normal albumin 3.1 chol 143; ldl 73; trig 46; hdl 61 11-04-14: wbc 4.0; hgb 11.6; hct 40.7; mcv 89.9; plt 174 12-22-14: glucose 90; bun 25.2; creat 0.80; k+ 4.2; na++ 144  01-06-15: hgb a1c 5.7  01-12-15: peg tube site: MRSA: septra ds       Review of Systems Unable to perform ROS: Patient nonverbal    Physical Exam Constitutional: No distress.  Eyes: Conjunctivae are normal.  Neck: Neck supple. No JVD present. No thyromegaly present.  Cardiovascular: Normal rate, regular rhythm and intact distal pulses.   Respiratory: Effort normal and breath sounds normal. No respiratory distress. She has no wheezes.  GI: Soft. Bowel sounds are normal. She exhibits no distension.  There is no tenderness.  Has peg tube   Musculoskeletal: She exhibits no edema.  Able to move all extremities   Lymphadenopathy:    She has no cervical adenopathy.  Neurological: She is alert.  Skin: Skin is warm and dry. She is not diaphoretic.  Psychiatric: She has a normal mood and affect.     ASSESSMENT/ PLAN:  1. 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 Dysphagia: no signs of aspiration present;   will continue on thin liquids with regular diet and will monitor   Is using peg tube for medications and supplements   3. Afib: heart rate is regular; will continue lopressor 12.5 mg twice daily for rate control; will continue eliquis 5 mg twice daily and asa 81 mg daily   4. Dyslipidemia: will continue lipitor 40 mg daily  ldl is 51   5. Hypertension: will continue lopressor 12.5 mg twice daily and clonidine 0.1 mg patch changed weekly will continue lisinopril 10 mg daily; will monitor  6. Depression: her lexapro has been stopped; will not make changes will monitor   7. Allergic rhinitis: will continue claritin 10 mg daily   8. CVA: has aphasia:  is neurologically without change; will continue asa 81 mg daily eliquis 5 mg twice daily   9. Diabetes: is presently not on medications; her hgb a1c is 5.7. She is on ace; statin and asa        Synthia Innocent NP [facility] Adult Medicine  Contact [phone] Monday through Friday 8am- 5pm  After hours call [phone]

## 2015-02-15 ENCOUNTER — Encounter (HOSPITAL_COMMUNITY): Payer: Self-pay

## 2015-02-15 ENCOUNTER — Encounter (HOSPITAL_COMMUNITY)
Admission: RE | Admit: 2015-02-15 | Discharge: 2015-02-15 | Disposition: A | Discharge status: Home / Self Care | Payer: Medicare Other | Source: Ambulatory Visit | Attending: Nephrology | Admitting: Nephrology

## 2015-02-15 DIAGNOSIS — E779 Disorder of glycoprotein metabolism, unspecified: Secondary | ICD-10-CM | POA: Diagnosis not present

## 2015-02-15 MED ORDER — ACETAMINOPHEN 160 MG/5ML PO SOLN
500.0000 mg | ORAL | Status: AC
  Administered 2015-02-15: 500 mg
  Filled 2015-02-15: qty 20.3

## 2015-02-15 MED ORDER — SODIUM CHLORIDE 0.9 % IV SOLN
INTRAVENOUS | Status: AC
  Administered 2015-02-15: 250 mL via INTRAVENOUS

## 2015-02-15 MED ORDER — SODIUM CHLORIDE 0.9 % IV SOLN
1.0000 mg/kg | INTRAVENOUS | Status: AC
  Administered 2015-02-15: 65 mg via INTRAVENOUS
  Filled 2015-02-15: qty 6

## 2015-02-15 NOTE — Progress Notes (Signed)
Pt arrived from her Facility via Transport in w/c. Pt is awake and alert ,smiling and greeting staff with yes/no answers to questions. After being weighed was assisted via pivot and 2 staff members to stretcher. Peg tube assessed and with no residual, Tylenol elixir given as ordered prior to Fabrazyme infusion and flushed with 30 ml water. Pt has Depends in place that are dry.

## 2015-03-03 ENCOUNTER — Non-Acute Institutional Stay (SKILLED_NURSING_FACILITY): Payer: Medicare Other | Admitting: Adult Health

## 2015-03-03 DIAGNOSIS — T783XXA Angioneurotic edema, initial encounter: Secondary | ICD-10-CM

## 2015-03-05 ENCOUNTER — Encounter (HOSPITAL_COMMUNITY)
Admission: RE | Admit: 2015-03-05 | Payer: Medicare Other | Source: Ambulatory Visit | Attending: Nephrology | Admitting: Nephrology

## 2015-03-05 ENCOUNTER — Other Ambulatory Visit (HOSPITAL_COMMUNITY): Payer: Self-pay | Admitting: Nephrology

## 2015-03-09 ENCOUNTER — Non-Acute Institutional Stay (SKILLED_NURSING_FACILITY): Payer: Medicare Other | Admitting: Adult Health

## 2015-03-09 DIAGNOSIS — I482 Chronic atrial fibrillation, unspecified: Secondary | ICD-10-CM

## 2015-03-09 DIAGNOSIS — I119 Hypertensive heart disease without heart failure: Secondary | ICD-10-CM

## 2015-03-09 DIAGNOSIS — E785 Hyperlipidemia, unspecified: Secondary | ICD-10-CM | POA: Diagnosis not present

## 2015-03-09 DIAGNOSIS — I69391 Dysphagia following cerebral infarction: Secondary | ICD-10-CM

## 2015-03-09 DIAGNOSIS — E1149 Type 2 diabetes mellitus with other diabetic neurological complication: Secondary | ICD-10-CM | POA: Diagnosis not present

## 2015-03-09 DIAGNOSIS — R4702 Dysphasia: Secondary | ICD-10-CM | POA: Diagnosis not present

## 2015-03-09 DIAGNOSIS — R4701 Aphasia: Secondary | ICD-10-CM

## 2015-03-09 DIAGNOSIS — E7521 Fabry (-Anderson) disease: Secondary | ICD-10-CM

## 2015-03-09 DIAGNOSIS — I5043 Acute on chronic combined systolic (congestive) and diastolic (congestive) heart failure: Secondary | ICD-10-CM | POA: Diagnosis not present

## 2015-03-10 LAB — LIPID PANEL
CHOLESTEROL: 129 mg/dL (ref 0–200)
HDL: 64 mg/dL (ref 35–70)
LDL Cholesterol: 53 mg/dL
TRIGLYCERIDES: 60 mg/dL (ref 40–160)

## 2015-03-19 ENCOUNTER — Encounter (HOSPITAL_COMMUNITY)
Admission: RE | Admit: 2015-03-19 | Discharge: 2015-03-19 | Disposition: A | Discharge status: Home / Self Care | Payer: Medicare Other | Source: Ambulatory Visit | Attending: Nephrology | Admitting: Nephrology

## 2015-03-19 ENCOUNTER — Encounter (HOSPITAL_COMMUNITY): Payer: Self-pay

## 2015-03-19 DIAGNOSIS — R4701 Aphasia: Secondary | ICD-10-CM | POA: Insufficient documentation

## 2015-03-19 DIAGNOSIS — R944 Abnormal results of kidney function studies: Secondary | ICD-10-CM | POA: Diagnosis not present

## 2015-03-19 DIAGNOSIS — I4891 Unspecified atrial fibrillation: Secondary | ICD-10-CM | POA: Insufficient documentation

## 2015-03-19 DIAGNOSIS — I10 Essential (primary) hypertension: Secondary | ICD-10-CM | POA: Diagnosis not present

## 2015-03-19 DIAGNOSIS — E779 Disorder of glycoprotein metabolism, unspecified: Secondary | ICD-10-CM | POA: Insufficient documentation

## 2015-03-19 DIAGNOSIS — E119 Type 2 diabetes mellitus without complications: Secondary | ICD-10-CM | POA: Insufficient documentation

## 2015-03-19 DIAGNOSIS — F17200 Nicotine dependence, unspecified, uncomplicated: Secondary | ICD-10-CM | POA: Insufficient documentation

## 2015-03-19 MED ORDER — SODIUM CHLORIDE 0.9 % IV SOLN
Freq: Once | INTRAVENOUS | Status: AC
  Administered 2015-03-19: 250 mL via INTRAVENOUS

## 2015-03-19 MED ORDER — SODIUM CHLORIDE 0.9 % IV SOLN
1.0000 mg/kg | INTRAVENOUS | Status: DC
  Administered 2015-03-19: 60 mg via INTRAVENOUS
  Filled 2015-03-19: qty 6

## 2015-03-19 MED ORDER — ACETAMINOPHEN 160 MG/5ML PO SOLN
500.0000 mg | ORAL | Status: DC
  Administered 2015-03-19: 500 mg via ORAL
  Filled 2015-03-19: qty 20.3

## 2015-03-19 NOTE — Progress Notes (Addendum)
Pt resides at [facility] and comes to Short Stay every 2 weeks at the 0800 appointment time for her Fabrazyme infusion. The facility arranges for her transportation for this appointment. A copy of her upcoming appointments is sent with the patient at the time of her discharge to go back to the facility at each appointment. On 03/05/15 pt did not come to her appointment due to a transportation issue ( also this happened on 10/23/14). Today at 0810 I called the facilty to see if she was enroute for her infusion and spoke to [place] ( her nurse at [employer]) and she said the patient was currently eating breakfast, I reminded her that the patient was scheduled for her appointment at 0800 and needed to be here by 0830 to be able to infuse her Fabrazyme ( which takes 4-5 hours).Gasper Lloyd said she had communicated with the Transportation 2 weeks ago to remind them of her appointment and she would have the patient to Short Stay by 0830.She also said she would be looking into arranging for another Transportation service to bring patient for her future appointments.  She also asked if I would FAX her a copy of upcoming appointments.I reminded Gasper Lloyd that it is imperative that patient keep these appointments for this infusion to manage her Fabray's Disease and we cannot keep having her arrive late or not at all for these infusions. Shequeena verbalized understanding. Pt arrived via w/c at 0855 and was place on stretcher and currently being assessed for her infusion. I faxed a copy of the appointments thru 06/11/15 to [facility] and she also faxed to me a list of patient's current medications and it does show the appointment date and time on the [facility] for the facility. Patient's son [name] was here at 0815 and asked where his Mom was. I told him about my conversation with Gasper Lloyd and he states he had visited the facility on 03/18/15 and sope to Thalia Bloodgood about making sure his Mom arrived at  the 0800 appointment time on 03/19/15. He saw that his Mom arrived and is currently receiving her infusion now.

## 2015-03-19 NOTE — Progress Notes (Signed)
Transportation (Ajul) from [employer]) arrived and requested a copy of upcoming appoints which I gave to him and let him know I had also faxed to Lawson at the facility.He states he understands the importance of having her here on time for her infusions and the the 2 appointments she had missed in the past 6 months were when patient was being transported by another company ([employer]). Pt was discharged via w/c accompanied by Ajul to go to [facility] at main entrance

## 2015-03-19 NOTE — Progress Notes (Signed)
Uneventful infusion of Fabrazyme. Pt wearing an adult diaper . It was saturated with urine and had small stool in it so she was cleaned and fresh adult diaper placed on patient. Pt is awake and alert and today has spoken a few words along with yes/no. Pt ready for discharge and Transportation has been called . Pt was placed in w/c with 2 people and pivot to w/c. Pt currently watching TV

## 2015-03-21 HISTORY — PX: TOOTH EXTRACTION: SUR596

## 2015-03-22 ENCOUNTER — Encounter: Payer: Self-pay | Admitting: Adult Health

## 2015-03-22 DIAGNOSIS — T783XXA Angioneurotic edema, initial encounter: Secondary | ICD-10-CM | POA: Insufficient documentation

## 2015-03-22 NOTE — Progress Notes (Signed)
Patient ID: Rebecca [name], female   DOB: 01/18/1946, 70 y.o.   MRN: [id number]    Facility:  [facility]      Allergies  Allergen Reactions  . Lisinopril Swelling    Chief Complaint  Patient presents with  . Medical Management of Chronic Issues    HPI:  She is a long term resident of this facility being seen for the management of her chronic illnesses. Her angioedema has resolved once stopping her lisinopril. She is unable to participate in the hpi or ros. There are no nursing concerns at this time.  Her weight is currently 137 pounds which is stable her weight in Sept 2016 was 142 pounds.    Past Medical History  Diagnosis Date  . Hypertension   . 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 Expressive aphasia 03/23/2013  . Atrial fibrillation (HCC) 05/01/2013  . Tobacco abuse 05/01/2013  . Diabetes mellitus without complication (HCC)   . Depression   . Stroke (HCC)     2015  . Stroke [facility]) 2016    aphasia    Past Surgical History  Procedure Laterality Date  . Tee without cardioversion N/A 03/26/2013    Procedure: TRANSESOPHAGEAL ECHOCARDIOGRAM (TEE);  Surgeon: Thurmon Fair, MD;  Location: [facility] ENDOSCOPY;  Service: Cardiovascular;  Laterality: N/A;  . Cesarean section      x3  . Hip replacement Right 1990's  . Hip replacemet Left 1990's  . Laparoscopic gastrostomy N/A 04/20/2014    Procedure: LAPAROSCOPIC GASTROSTOMY TUBE PLACEMENT;  Surgeon: Axel Filler, MD;  Location: [facility] OR;  Service: General;  Laterality: N/A;  . Peg tube placemnt  04/15/14    VITAL SIGNS BP 126/72 mmHg  Pulse 64  Ht 5\' 4"  (1.626 m)  Wt 137 lb (62.143 kg)  BMI 23.50 kg/m2  SpO2 97%  Patient's Medications  New Prescriptions   No medications on file  Previous Medications   APIXABAN (ELIQUIS) 5 MG TABS TABLET    Take 1 tablet (5 mg total) by mouth 2 (two) times daily.   ASPIRIN EC 81 MG TABLET    Take 81 mg by mouth daily.   ATORVASTATIN (LIPITOR) 40 MG TABLET    Take 1 tablet (40 mg total) by  mouth daily at 6 PM.   CLONIDINE (CATAPRES - DOSED IN MG/24 HR) 0.1 MG/24HR PATCH    Place 0.1 mg onto the skin every Saturday.   LORATADINE (CLARITIN) 10 MG TABLET    Take 10 mg by mouth daily with breakfast.   METOPROLOL TARTRATE (LOPRESSOR) 25 MG TABLET    Take 0.5 tablets (12.5 mg total) by mouth 2 (two) times daily.   MULTIPLE VITAMIN (MULTIVITAMIN) LIQD    Take 5 mLs by mouth daily with breakfast.  Modified Medications   No medications on file  Discontinued Medications   No medications on file     SIGNIFICANT DIAGNOSTIC EXAMS    07-10-14: right neck ultrasound: no abnormality identified in the area of concern in the right submandibular region   11-27-14: chest x-ray: no acute cardiopulmonary process     LABS REVIEWED:   04-26-14: glucose 138; bun 25; creat 0.97; k+4.5; na++140 05-01-14: wbc 5.1; hgb 12.1; hct 34.4; plt 231  06-05-14: chol 112; ldl 51; trig 83; hdl 45  06-22-14: urine culture: citrobacter koseri: cipro  10-30-14: wbc 3.5; hgb 10.6; hct 35.2; mcv 86.8; plt 196; glucose 77; bun 21.5; creat 0.72; k+4.1; na++142; liver normal albumin 3.1 chol 143; ldl 73; trig 46; hdl 61 11-04-14: wbc 4.0;  hgb 11.6; hct 40.7; mcv 89.9; plt 174 12-22-14: glucose 90; bun 25.2; creat 0.80; k+ 4.2; na++ 144  01-06-15: hgb a1c 5.7  01-12-15: peg tube site: MRSA: septra ds       Review of Systems Unable to perform ROS: Patient nonverbal    Physical Exam Constitutional: No distress.  Eyes: Conjunctivae are normal.  Neck: Neck supple. No JVD present. No thyromegaly present.  Cardiovascular: Normal rate, regular rhythm and intact distal pulses.   Respiratory: Effort normal and breath sounds normal. No respiratory distress. She has no wheezes.  GI: Soft. Bowel sounds are normal. She exhibits no distension. There is no tenderness.  Has peg tube   Musculoskeletal: She exhibits no edema.  Able to move all extremities   Lymphadenopathy:    She has no cervical adenopathy.  Neurological:  She is alert.  Skin: Skin is warm and dry. She is not diaphoretic.  Psychiatric: She has a normal mood and affect.     ASSESSMENT/ PLAN:  1. 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 Dysphagia: no signs of aspiration present;   will continue on thin liquids with regular diet and will monitor   Is using peg tube for medications and supplements   3. Afib: heart rate is regular; will continue lopressor 12.5 mg twice daily for rate control; will continue eliquis 5 mg twice daily and asa 81 mg daily   4. Dyslipidemia: will continue lipitor 40 mg daily  ldl is 51   5. Hypertension: will continue lopressor 12.5 mg twice daily and clonidine 0.1 mg patch changed weekly; will monitor the lisinopril was stopped due to angioedema   6. Depression: her lexapro has been stopped; will not make changes will monitor   7. Allergic rhinitis: will continue claritin 10 mg daily   8. CVA: has aphasia:  is neurologically without change; will continue asa 81 mg daily eliquis 5 mg twice daily   9. Diabetes: is presently not on medications; her hgb a1c is 5.7. She is on  statin and asa          Synthia Innocent NP [facility] Adult Medicine  Contact [phone] Monday through Friday 8am- 5pm  After hours call [phone]

## 2015-03-22 NOTE — Progress Notes (Signed)
Patient ID: Rebecca [name], female   DOB: April 28, 1945, 70 y.o.   MRN: [id number]    Facility:  [facility]      No Known Allergies  Chief Complaint  Patient presents with  . Acute Visit    patient status     HPI:  Staff reports that her upper lip is swollen. Her upper lip is nearly twice its normal size. She cannot fully participate in the hpi or ros; but denies shortness of breath or difficulty swallowing. She does take lisinopril and has taken this medication for a long time. More than likely this is the source of her angioedema.   Past Medical History  Diagnosis Date  . Hypertension   . 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 Expressive aphasia 03/23/2013  . Atrial fibrillation (HCC) 05/01/2013  . Tobacco abuse 05/01/2013  . Diabetes mellitus without complication (HCC)   . Depression   . Stroke (HCC)     2015  . Stroke [facility]) 2016    aphasia    Past Surgical History  Procedure Laterality Date  . Tee without cardioversion N/A 03/26/2013    Procedure: TRANSESOPHAGEAL ECHOCARDIOGRAM (TEE);  Surgeon: Thurmon Fair, MD;  Location: [facility] ENDOSCOPY;  Service: Cardiovascular;  Laterality: N/A;  . Cesarean section      x3  . Hip replacement Right 1990's  . Hip replacemet Left 1990's  . Laparoscopic gastrostomy N/A 04/20/2014    Procedure: LAPAROSCOPIC GASTROSTOMY TUBE PLACEMENT;  Surgeon: Axel Filler, MD;  Location: [facility] OR;  Service: General;  Laterality: N/A;  . Peg tube placemnt  04/15/14    VITAL SIGNS BP 132/80 mmHg  Pulse 68  Ht  (1.626 m)  Wt 144 lb (65.318 kg)  BMI 24.71 kg/m2  SpO2 93%  Patient's Medications  New Prescriptions   No medications on file  Previous Medications   APIXABAN (ELIQUIS) 5 MG TABS TABLET    Take 1 tablet (5 mg total) by mouth 2 (two) times daily.   ASPIRIN EC 81 MG TABLET    Take 81 mg by mouth daily.   ATORVASTATIN (LIPITOR) 40 MG TABLET    Take 1 tablet (40 mg total) by mouth daily at 6 PM.   CLONIDINE (CATAPRES - DOSED IN MG/24 HR) 0.1  MG/24HR PATCH    Place 0.1 mg onto the skin every Saturday.   LISINOPRIL (PRINIVIL,ZESTRIL) 5 MG TABLET    Take 2 tablets (10 mg total) by mouth daily with breakfast.   LORATADINE (CLARITIN) 10 MG TABLET    Take 10 mg by mouth daily with breakfast.   METOPROLOL TARTRATE (LOPRESSOR) 25 MG TABLET    Take 0.5 tablets (12.5 mg total) by mouth 2 (two) times daily.   MULTIPLE VITAMIN (MULTIVITAMIN) LIQD    Take 5 mLs by mouth daily with breakfast.  Modified Medications   No medications on file  Discontinued Medications   No medications on file     SIGNIFICANT DIAGNOSTIC EXAMS   07-10-14: right neck ultrasound: no abnormality identified in the area of concern in the right submandibular region   11-27-14: chest x-ray: no acute cardiopulmonary process     LABS REVIEWED:   04-26-14: glucose 138; bun 25; creat 0.97; k+4.5; na++140 05-01-14: wbc 5.1; hgb 12.1; hct 34.4; plt 231  06-05-14: chol 112; ldl 51; trig 83; hdl 45  06-22-14: urine culture: citrobacter koseri: cipro  10-30-14: wbc 3.5; hgb 10.6; hct 35.2; mcv 86.8; plt 196; glucose 77; bun 21.5; creat 0.72; k+4.1; na++142; liver normal albumin 3.1 chol  143; ldl 73; trig 46; hdl 61 11-04-14: wbc 4.0; hgb 11.6; hct 40.7; mcv 89.9; plt 174 12-22-14: glucose 90; bun 25.2; creat 0.80; k+ 4.2; na++ 144  01-06-15: hgb a1c 5.7  01-12-15: peg tube site: MRSA: septra ds       Review of Systems Unable to perform ROS: Patient nonverbal    Physical Exam Constitutional: No distress.  Eyes: Conjunctivae are normal.  Neck: Neck supple. No JVD present. No thyromegaly present. her upper lip is nearly twice its normal size.  Cardiovascular: Normal rate, regular rhythm and intact distal pulses.   Respiratory: Effort normal and breath sounds normal. No respiratory distress. She has no wheezes.  GI: Soft. Bowel sounds are normal. She exhibits no distension. There is no tenderness.  Has peg tube   Musculoskeletal: She exhibits no edema.  Able to move all  extremities   Lymphadenopathy:    She has no cervical adenopathy.  Neurological: She is alert.  Skin: Skin is warm and dry. She is not diaphoretic.  Psychiatric: She has a normal mood and affect.      ASSESSMENT/ PLAN:  1. Angioedema: will begin benadryl 25 mg every 6 hours for 3 days then every 6 hours as needed for one week. Will stop the lisinopril and will mark as allergy will monitor her blood pressure.      Synthia Innocent NP [facility]  Contact [phone] Monday through Friday 8am- 5pm  After hours call [phone]

## 2015-03-30 LAB — HEPATIC FUNCTION PANEL
ALK PHOS: 30 U/L (ref 25–125)
ALT: 15 U/L (ref 7–35)
AST: 28 U/L (ref 13–35)
BILIRUBIN, TOTAL: 0.6 mg/dL

## 2015-03-30 LAB — CBC AND DIFFERENTIAL
HEMATOCRIT: 41 % (ref 36–46)
HEMOGLOBIN: 12.9 g/dL (ref 12.0–16.0)
PLATELETS: 148 10*3/uL — AB (ref 150–399)
WBC: 4.7 10^3/mL

## 2015-03-30 LAB — BASIC METABOLIC PANEL
BUN: 41 mg/dL — AB (ref 4–21)
CREATININE: 1.2 mg/dL — AB (ref 0.5–1.1)
Potassium: 4.3 mmol/L (ref 3.4–5.3)
Sodium: 142 mmol/L (ref 137–147)

## 2015-03-31 ENCOUNTER — Non-Acute Institutional Stay (SKILLED_NURSING_FACILITY): Payer: Medicare Other | Admitting: Adult Health

## 2015-03-31 DIAGNOSIS — N390 Urinary tract infection, site not specified: Secondary | ICD-10-CM

## 2015-04-02 ENCOUNTER — Encounter (HOSPITAL_COMMUNITY)
Admission: RE | Admit: 2015-04-02 | Discharge: 2015-04-02 | Disposition: A | Discharge status: Home / Self Care | Payer: Medicare Other | Source: Ambulatory Visit | Attending: Nephrology | Admitting: Nephrology

## 2015-04-02 ENCOUNTER — Encounter (HOSPITAL_COMMUNITY): Payer: Self-pay

## 2015-04-02 DIAGNOSIS — E119 Type 2 diabetes mellitus without complications: Secondary | ICD-10-CM | POA: Diagnosis not present

## 2015-04-02 DIAGNOSIS — R4701 Aphasia: Secondary | ICD-10-CM | POA: Diagnosis not present

## 2015-04-02 DIAGNOSIS — F17200 Nicotine dependence, unspecified, uncomplicated: Secondary | ICD-10-CM | POA: Insufficient documentation

## 2015-04-02 DIAGNOSIS — R944 Abnormal results of kidney function studies: Secondary | ICD-10-CM | POA: Insufficient documentation

## 2015-04-02 DIAGNOSIS — I4891 Unspecified atrial fibrillation: Secondary | ICD-10-CM | POA: Diagnosis not present

## 2015-04-02 DIAGNOSIS — E779 Disorder of glycoprotein metabolism, unspecified: Secondary | ICD-10-CM | POA: Diagnosis not present

## 2015-04-02 DIAGNOSIS — I10 Essential (primary) hypertension: Secondary | ICD-10-CM | POA: Insufficient documentation

## 2015-04-02 MED ORDER — ACETAMINOPHEN 160 MG/5ML PO SOLN
500.0000 mg | ORAL | Status: DC
  Administered 2015-04-02: 500 mg via ORAL
  Filled 2015-04-02: qty 20.3

## 2015-04-02 MED ORDER — AGALSIDASE BETA 5 MG IV SOLR
1.0000 mg/kg | INTRAVENOUS | Status: DC
  Administered 2015-04-02: 60 mg via INTRAVENOUS
  Filled 2015-04-02: qty 6

## 2015-04-02 MED ORDER — SODIUM CHLORIDE 0.9 % IV SOLN
Freq: Once | INTRAVENOUS | Status: AC
  Administered 2015-04-02: 10:00:00 via INTRAVENOUS

## 2015-04-02 NOTE — Progress Notes (Signed)
Uneventful infusion of FABRAZYME. Pt arrived wearing an dry adult diaper. It was saturated at the end of the infusion. Pt was cleaned and fresh on applied. Then patient was assisted to w/c and transportation arrived to take her back to [facility].

## 2015-04-02 NOTE — Progress Notes (Signed)
Pt arrived today from [facility] per their transportation in w/c. Pt is animated and smiling. recognizing staff and answering questions with yes/no. As per usual patient in wheelchair was weighed then patient was place on stretcher (pivoting with 2 staff members) then w/c was weighed. There was an 18 lb weight difference from 2 weeks ago. Calculations were rechecked and confirmed. Called facility to see if they had a current which they did not. Spoke with pharmacy and this will not change the dosage of her Fabrazyme. Pt still had PEG tube in place and 5 ml residual obtained then Tylenol was given and flushed with 30 ml sterile water. Pt is dozing.

## 2015-04-05 ENCOUNTER — Non-Acute Institutional Stay (SKILLED_NURSING_FACILITY): Payer: Medicare Other | Admitting: Internal Medicine

## 2015-04-05 ENCOUNTER — Encounter: Payer: Self-pay | Admitting: Internal Medicine

## 2015-04-05 DIAGNOSIS — E7521 Fabry (-Anderson) disease: Secondary | ICD-10-CM | POA: Diagnosis not present

## 2015-04-05 DIAGNOSIS — I482 Chronic atrial fibrillation, unspecified: Secondary | ICD-10-CM

## 2015-04-05 DIAGNOSIS — N390 Urinary tract infection, site not specified: Secondary | ICD-10-CM

## 2015-04-05 DIAGNOSIS — B9620 Unspecified Escherichia coli [E. coli] as the cause of diseases classified elsewhere: Secondary | ICD-10-CM | POA: Diagnosis not present

## 2015-04-05 NOTE — Assessment & Plan Note (Signed)
Chronic, no change in status will continue frabrazyme injection every 2 weeks will monitor

## 2015-04-05 NOTE — Assessment & Plan Note (Signed)
Pan-sensitive; pt started on cipro 500 mg BID for 7days

## 2015-04-05 NOTE — Assessment & Plan Note (Signed)
Chronic and stable; will continue lopressor 12.5 mg twice daily for rate control; will continue eliquis 5 mg twice daily and asa 81 mg daily

## 2015-04-05 NOTE — Progress Notes (Signed)
MRN: [id number] Name: Rebecca [name]  Sex: female Age: 70 y.o. DOB: [birth year]  PSC #: Ronni Rumble Facility/Room:111 Level Of Care: SNF Provider: Merrilee Seashore D Emergency Contacts: Extended Emergency Contact Information Primary Emergency Contact: Allie Dimmer [place] Home Phone: [phone] Relation: Son  Code Status:   Allergies: Lisinopril  Chief Complaint  Patient presents with  . Acute Visit  . Medical Management of Chronic Issues    HPI: Patient is 70 y.o. female with AF, HTN,depression, DM2 and s/p stroke who is being seen today acutely for a UTI and for chronic issues of AF and fabry's disease.  Past Medical History  Diagnosis Date  . Hypertension   . 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 Expressive aphasia 03/23/2013  . Atrial fibrillation (HCC) 05/01/2013  . Tobacco abuse 05/01/2013  . Diabetes mellitus without complication (HCC)   . Depression   . Stroke (HCC)     2015  . Stroke [facility]) 2016    aphasia    Past Surgical History  Procedure Laterality Date  . Tee without cardioversion N/A 03/26/2013    Procedure: TRANSESOPHAGEAL ECHOCARDIOGRAM (TEE);  Surgeon: Thurmon Fair, MD;  Location: [facility] ENDOSCOPY;  Service: Cardiovascular;  Laterality: N/A;  . Cesarean section      x3  . Hip replacement Right 1990's  . Hip replacemet Left 1990's  . Laparoscopic gastrostomy N/A 04/20/2014    Procedure: LAPAROSCOPIC GASTROSTOMY TUBE PLACEMENT;  Surgeon: Axel Filler, MD;  Location: [facility] OR;  Service: General;  Laterality: N/A;  . Peg tube placemnt  04/15/14      Medication List       This list is accurate as of: 04/05/15 11:59 PM.  Always use your most recent med list.               apixaban 5 MG Tabs tablet  Commonly known as:  ELIQUIS  Take 1 tablet (5 mg total) by mouth 2 (two) times daily.     aspirin EC 81 MG tablet  Take 81 mg by mouth daily.     atorvastatin 40 MG tablet  Commonly known as:  LIPITOR  Take 1 tablet (40 mg total) by  mouth daily at 6 PM.     cloNIDine 0.1 mg/24hr patch  Commonly known as:  CATAPRES - Dosed in mg/24 hr  Place 0.1 mg onto the skin every Saturday.     loratadine 10 MG tablet  Commonly known as:  CLARITIN  Take 10 mg by mouth daily with breakfast.     metoprolol tartrate 25 MG tablet  Commonly known as:  LOPRESSOR  Take 0.5 tablets (12.5 mg total) by mouth 2 (two) times daily.     multivitamin Liqd  Take 5 mLs by mouth daily with breakfast.        No orders of the defined types were placed in this encounter.     There is no immunization history on file for this patient.  Social History  Substance Use Topics  . Smoking status: Never Smoker   . Smokeless tobacco: Not on file  . Alcohol Use: No    Review of Systems UTO 2/2 pt non verbal; nursing without concerns    Filed Vitals:   04/05/15 1205  BP: 124/74  Pulse: 80  Temp: 98 F (36.7 C)  Resp: 18    Physical Exam  GENERAL APPEARANCE: Alert, nonconversant, No acute distress  SKIN: No diaphoresis rash HEENT: Unremarkable RESPIRATORY: Breathing is even, unlabored. Lung sounds are clear   CARDIOVASCULAR:  Heart RRR no murmurs, rubs or gallops. No peripheral edema  GASTROINTESTINAL: Abdomen is soft, non-tender, not distended w/ normal bowel sounds.  GENITOURINARY: Bladder non tender, not distended  MUSCULOSKELETAL: No abnormal joints or musculature NEUROLOGIC: Cranial nerves 2-12 grossly intact. Moves all extremities PSYCHIATRIC: n/a, no behavioral issues  Patient Active Problem List   Diagnosis Date Noted  . E. coli UTI 04/05/2015  . Angioedema of lips 03/22/2015  . 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 Anemia due to other cause 11/03/2014  . PEG (percutaneous endoscopic gastrostomy) adjustment/replacement/removal (HCC)   . Dysphagia S/P CVA (cerebrovascular accident) 04/22/2014  . 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 Cardioembolic stroke (HCC) 04/07/2014  . Protein-calorie malnutrition, severe (HCC) 04/05/2014  . Global aphasia   . Paroxysmal atrial fibrillation (HCC)   . Hyperlipidemia   . Acute diastolic heart failure (HCC) 04/02/2014  . Elevated troponin I level   . Acute on chronic combined systolic and diastolic CHF (congestive heart failure) (HCC)   . Pulmonary hypertension (HCC)   . Chronic atrial fibrillation (HCC)   . Diabetes type 2, controlled (HCC)   . HLD (hyperlipidemia)   . Elevated troponin 04/01/2014  . Stroke (HCC) 04/01/2014  . Aphasia 04/01/2014  . 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 Atrial fibrillation (HCC) 05/01/2013  . Tobacco abuse 05/01/2013  . Expressive aphasia 03/23/2013  . Diabetes mellitus (HCC) 03/23/2013  . Decreased calculated GFR 03/23/2013    CBC    Component Value Date/Time   WBC 5.1 05/01/2014 0610   RBC 4.27 05/01/2014 0610   HGB 12.1 05/01/2014 0610   HCT 34.4* 05/01/2014 0610   PLT 237 05/01/2014 0610   MCV 80.6 05/01/2014 0610   LYMPHSABS 1.3 04/08/2014 0730   MONOABS 0.6 04/08/2014 0730   EOSABS 0.0 04/08/2014 0730   BASOSABS 0.0 04/08/2014 0730    CMP     Component Value Date/Time   NA 140 04/26/2014 0552   K 4.5 04/26/2014 0552   CL 108 04/26/2014 0552   CO2 27 04/26/2014 0552   GLUCOSE 138* 04/26/2014 0552   BUN 25* 04/26/2014 0552   CREATININE 0.97 04/26/2014 0552   CALCIUM 8.0* 04/26/2014 0552   PROT 7.5 04/08/2014 0730   ALBUMIN 3.6 04/08/2014 0730   AST 35 04/08/2014 0730   ALT 19 04/08/2014 0730   ALKPHOS 37* 04/08/2014 0730   BILITOT 0.9 04/08/2014 0730   GFRNONAA 58* 04/26/2014 0552   GFRAA 68* 04/26/2014 0552    Assessment and Plan  E. coli UTI Pan-sensitive; pt started on cipro 500 mg BID for 7days  Chronic atrial fibrillation Chronic and stable; will continue lopressor 12.5 mg twice daily for rate control; will continue eliquis 5 mg twice daily and asa 81  mg daily   Fabry disease Chronic, no change in status will continue frabrazyme injection every 2 weeks will monitor     [name], MD

## 2015-04-10 ENCOUNTER — Encounter: Payer: Self-pay | Admitting: Internal Medicine

## 2015-04-16 ENCOUNTER — Encounter (HOSPITAL_COMMUNITY)
Admission: RE | Admit: 2015-04-16 | Discharge: 2015-04-16 | Disposition: A | Discharge status: Home / Self Care | Payer: Medicare Other | Source: Ambulatory Visit | Attending: Nephrology | Admitting: Nephrology

## 2015-04-16 ENCOUNTER — Encounter (HOSPITAL_COMMUNITY): Payer: Self-pay

## 2015-04-16 DIAGNOSIS — E779 Disorder of glycoprotein metabolism, unspecified: Secondary | ICD-10-CM | POA: Diagnosis not present

## 2015-04-16 MED ORDER — ACETAMINOPHEN 160 MG/5ML PO SOLN
500.0000 mg | ORAL | Status: DC
  Administered 2015-04-16: 500 mg via ORAL
  Filled 2015-04-16: qty 20.3

## 2015-04-16 MED ORDER — SODIUM CHLORIDE 0.9 % IV SOLN
INTRAVENOUS | Status: DC
  Administered 2015-04-16: 09:00:00 via INTRAVENOUS

## 2015-04-16 MED ORDER — SODIUM CHLORIDE 0.9 % IV SOLN
1.0000 mg/kg | INTRAVENOUS | Status: DC
  Administered 2015-04-16: 60 mg via INTRAVENOUS
  Filled 2015-04-16: qty 7

## 2015-04-16 NOTE — Progress Notes (Addendum)
Pt arrived today via transported from [facility] per w/c. She is lethargic today and after weighing patient in w/c then she was assisted to stretcher with HOB 45 degrees. It was reported by transported pt had a recent tooth extraction. Pt has gauze in right side of mouth. Gauze was removed and right lower canine tooth is missing with serosanguineous drainage o 4x4. This was replaced with fresh sterilel4x4 and asked patient to bite on gauze which she did. Peg tube was aspirated and 60 ml of yellowish liquid was obtained and returned. Pt indicated with facial expressing she is having some tooth pain so tylenol was given via PEG as ordered and flushed with 30 ml sterile water. HOB remains at 45 degrees. Pt remains aphasic but indicates her name and place and that she is here for her infusion per yes/no answers.health history and medication record was updated from recen MD assessment documented on 04/05/15

## 2015-04-16 NOTE — Progress Notes (Signed)
Gauze in mouth for bleeding from prior teeth extraction. Oozing serosanguinous drainage. Pt keeps spitting out the gauze . I have replaced it and asked her to bite on gauze and she is compliant but falls asleep and spits it out. No increase in oozing.

## 2015-04-16 NOTE — Progress Notes (Signed)
Uneventful infusion of Fabrazyme. Pt is not at lethargic as upon arrival.Gauze remains in mouth and still some serosanguinous drainage. Pt voided moderate amount and adult diaper was changes. Tranportation back to [facility] has been called and awaiting arrival

## 2015-04-18 ENCOUNTER — Encounter: Payer: Self-pay | Admitting: Adult Health

## 2015-04-18 DIAGNOSIS — N390 Urinary tract infection, site not specified: Secondary | ICD-10-CM | POA: Insufficient documentation

## 2015-04-18 NOTE — Progress Notes (Signed)
Patient ID: Rebecca [name], female   DOB: 06-16-1945, 70 y.o.   MRN: [id number]   Facility:  [facility]       Allergies  Allergen Reactions  . Lisinopril Swelling    Chief Complaint  Patient presents with  . Acute Visit    nursing concerns     HPI:  Staff reports that she is not eating or drinking well at this time. She is less alert; more lethargic. There are no reports of fever present. Her chest x-ray does not indicated pneumonia. There is concern that she is developing an UTI. She is not able to fully participate in the hpi or ros.    Past Medical History  Diagnosis Date  . Hypertension   . 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 Expressive aphasia 03/23/2013  . Atrial fibrillation (HCC) 05/01/2013  . Tobacco abuse 05/01/2013  . Diabetes mellitus without complication (HCC)   . Depression   . Stroke (HCC)     2015  . Stroke [facility]) 2016    aphasia    Past Surgical History  Procedure Laterality Date  . Tee without cardioversion N/A 03/26/2013    Procedure: TRANSESOPHAGEAL ECHOCARDIOGRAM (TEE);  Surgeon: Thurmon Fair, MD;  Location: [facility] ENDOSCOPY;  Service: Cardiovascular;  Laterality: N/A;  . Cesarean section      x3  . Hip replacement Right 1990's  . Hip replacemet Left 1990's  . Laparoscopic gastrostomy N/A 04/20/2014    Procedure: LAPAROSCOPIC GASTROSTOMY TUBE PLACEMENT;  Surgeon: Axel Filler, MD;  Location: [facility] OR;  Service: General;  Laterality: N/A;  . Peg tube placemnt  04/15/14  . Tooth extraction Right 1/17    lower canine    VITAL SIGNS BP 235/71 mmHg  Pulse 74  Ht  (1.626 m)  Wt 137 lb (62.143 kg)  BMI 23.50 kg/m2  SpO2 97%  Patient's Medications  New Prescriptions   No medications on file  Previous Medications   APIXABAN (ELIQUIS) 5 MG TABS TABLET    Take 1 tablet (5 mg total) by mouth 2 (two) times daily.   ASPIRIN EC 81 MG TABLET    Take 81 mg by mouth daily.   ATORVASTATIN (LIPITOR) 40 MG TABLET    Take 1 tablet (40 mg total) by mouth  daily at 6 PM.   CLONIDINE (CATAPRES - DOSED IN MG/24 HR) 0.1 MG/24HR PATCH    Place 0.1 mg onto the skin every Saturday.   LORATADINE (CLARITIN) 10 MG TABLET    Take 10 mg by mouth daily with breakfast.   METOPROLOL TARTRATE (LOPRESSOR) 25 MG TABLET    Take 0.5 tablets (12.5 mg total) by mouth 2 (two) times daily.   MULTIPLE VITAMIN (MULTIVITAMIN) LIQD    Take 5 mLs by mouth daily with breakfast.  Modified Medications   No medications on file  Discontinued Medications   No medications on file     SIGNIFICANT DIAGNOSTIC EXAMS   07-10-14: right neck ultrasound: no abnormality identified in the area of concern in the right submandibular region   11-27-14: chest x-ray: no acute cardiopulmonary process   03-31-15: wbc chest x-ray: mild cardiomegaly; no acute infiltrate improved from 03-30-15.     LABS REVIEWED:   04-26-14: glucose 138; bun 25; creat 0.97; k+4.5; na++140 05-01-14: wbc 5.1; hgb 12.1; hct 34.4; plt 231  06-05-14: chol 112; ldl 51; trig 83; hdl 45  06-22-14: urine culture: citrobacter koseri: cipro  10-30-14: wbc 3.5; hgb 10.6; hct 35.2; mcv 86.8; plt 196; glucose 77; bun 21.5;  creat 0.72; k+4.1; na++142; liver normal albumin 3.1 chol 143; ldl 73; trig 46; hdl 61 11-04-14: wbc 4.0; hgb 11.6; hct 40.7; mcv 89.9; plt 174 12-22-14: glucose 90; bun 25.2; creat 0.80; k+ 4.2; na++ 144  01-06-15: hgb a1c 5.7  01-12-15: peg tube site: MRSA: septra ds  03-30-15; wbc 4.7; hgb 12.9; hct 40.6; mcv 85.4; plt 148; glucose 97; bun 41; creat 1.17; k+ 4.3; na++142; liver normal albumin 3.7       Review of Systems Unable to perform ROS: Patient nonverbal    Physical Exam Constitutional: No distress.  Eyes: Conjunctivae are normal.  Neck: Neck supple. No JVD present. No thyromegaly present.  Cardiovascular: Normal rate, regular rhythm and intact distal pulses.   Respiratory: Effort normal and breath sounds normal. No respiratory distress. She has no wheezes.  GI: Soft. Bowel sounds are  normal. She exhibits no distension. There is no tenderness.  Has peg tube   Musculoskeletal: She exhibits no edema.  Able to move all extremities   Lymphadenopathy:    She has no cervical adenopathy.  Neurological: lethargic   Skin: Skin is warm and dry. She is not diaphoretic.  Psychiatric: lethargic.         ASSESSMENT/ PLAN:  1. UTI: is probable will have staff collect ua/c&s will begin cipro 500 mg twice daily for one week with florastor twice daily for 2 weeks pending her culture  Reports.     Synthia Innocent NP [facility] Adult [facility]  Contact [phone] Monday through Friday 8am- 5pm  After hours call [phone]

## 2015-04-30 ENCOUNTER — Encounter (HOSPITAL_COMMUNITY): Payer: Self-pay

## 2015-04-30 ENCOUNTER — Encounter (HOSPITAL_COMMUNITY)
Admission: RE | Admit: 2015-04-30 | Discharge: 2015-04-30 | Disposition: A | Discharge status: Home / Self Care | Payer: Medicare Other | Source: Ambulatory Visit | Attending: Nephrology | Admitting: Nephrology

## 2015-04-30 DIAGNOSIS — E779 Disorder of glycoprotein metabolism, unspecified: Secondary | ICD-10-CM | POA: Insufficient documentation

## 2015-04-30 DIAGNOSIS — R4701 Aphasia: Secondary | ICD-10-CM | POA: Insufficient documentation

## 2015-04-30 DIAGNOSIS — I10 Essential (primary) hypertension: Secondary | ICD-10-CM | POA: Insufficient documentation

## 2015-04-30 DIAGNOSIS — I4891 Unspecified atrial fibrillation: Secondary | ICD-10-CM | POA: Diagnosis not present

## 2015-04-30 DIAGNOSIS — F17200 Nicotine dependence, unspecified, uncomplicated: Secondary | ICD-10-CM | POA: Diagnosis not present

## 2015-04-30 DIAGNOSIS — E119 Type 2 diabetes mellitus without complications: Secondary | ICD-10-CM | POA: Diagnosis not present

## 2015-04-30 DIAGNOSIS — R944 Abnormal results of kidney function studies: Secondary | ICD-10-CM | POA: Insufficient documentation

## 2015-04-30 LAB — COMPREHENSIVE METABOLIC PANEL
ALK PHOS: 34 U/L — AB (ref 38–126)
ALT: 21 U/L (ref 14–54)
AST: 32 U/L (ref 15–41)
Albumin: 3.5 g/dL (ref 3.5–5.0)
Anion gap: 7 (ref 5–15)
BUN: 24 mg/dL — ABNORMAL HIGH (ref 6–20)
CHLORIDE: 110 mmol/L (ref 101–111)
CO2: 25 mmol/L (ref 22–32)
CREATININE: 0.84 mg/dL (ref 0.44–1.00)
Calcium: 9.1 mg/dL (ref 8.9–10.3)
Glucose, Bld: 81 mg/dL (ref 65–99)
POTASSIUM: 3.9 mmol/L (ref 3.5–5.1)
Sodium: 142 mmol/L (ref 135–145)
Total Bilirubin: 0.7 mg/dL (ref 0.3–1.2)
Total Protein: 7.8 g/dL (ref 6.5–8.1)

## 2015-04-30 LAB — CBC
HEMATOCRIT: 36 % (ref 36.0–46.0)
HEMOGLOBIN: 11.5 g/dL — AB (ref 12.0–15.0)
MCH: 27.8 pg (ref 26.0–34.0)
MCHC: 31.9 g/dL (ref 30.0–36.0)
MCV: 87.2 fL (ref 78.0–100.0)
Platelets: 160 10*3/uL (ref 150–400)
RBC: 4.13 MIL/uL (ref 3.87–5.11)
RDW: 15.2 % (ref 11.5–15.5)
WBC: 3.5 10*3/uL — ABNORMAL LOW (ref 4.0–10.5)

## 2015-04-30 MED ORDER — ACETAMINOPHEN 160 MG/5ML PO SOLN
500.0000 mg | ORAL | Status: DC
  Administered 2015-04-30: 500 mg via ORAL
  Filled 2015-04-30: qty 20.3

## 2015-04-30 MED ORDER — SODIUM CHLORIDE 0.9 % IV SOLN
INTRAVENOUS | Status: DC
  Administered 2015-04-30: 09:00:00 via INTRAVENOUS

## 2015-04-30 MED ORDER — SODIUM CHLORIDE 0.9 % IV SOLN
1.0000 mg/kg | INTRAVENOUS | Status: DC
  Administered 2015-04-30: 60 mg via INTRAVENOUS
  Filled 2015-04-30: qty 7

## 2015-04-30 NOTE — Progress Notes (Signed)
Pt arrived today via w/c and transport from [facility] for her Fabrazyme infusion. Pt is alert and smiling. She stated her name today and confirmed her birth date but still a little frustrated when asked any question other that required anything other than yes/no. Denies any pain discomfort .Pt was weighed in w/c then Assisted to stretcher with 2 staff members and she was able to pivot and w/c was weighed to get accurate weight. BP upon arrival was 173/107 left arm and 154/106 righ arm. After 15 minutes resting BP right arm was 162/98. Pt denies any discomfort. Called [facility] and spoke with her nurse Jordan Likes and she states Rebecca [name] did not receive her Catapres or Metropolol this AM but she would get it when she returned to the facility.PEG tube no residual and Tylenol elixer was given and flushed with sterile water. Pt is sleeping now but easily arouses on calling her name. She currently has a dry adult diaper in place from her facility

## 2015-04-30 NOTE — Progress Notes (Signed)
Uneventful infusion of Fabrazyme.>Depends was saturated with urine and fresh DEPENDS(Adult diaper) applied. Pt assisted to w/c with 2 staff members and pivot. Awaiting transport back to her facility

## 2015-05-04 ENCOUNTER — Encounter: Payer: Self-pay | Admitting: Adult Health

## 2015-05-04 ENCOUNTER — Non-Acute Institutional Stay (SKILLED_NURSING_FACILITY): Payer: Medicare Other | Admitting: Adult Health

## 2015-05-04 ENCOUNTER — Encounter: Payer: Self-pay | Admitting: Internal Medicine

## 2015-05-04 DIAGNOSIS — I69391 Dysphagia following cerebral infarction: Secondary | ICD-10-CM | POA: Diagnosis not present

## 2015-05-04 DIAGNOSIS — E1149 Type 2 diabetes mellitus with other diabetic neurological complication: Secondary | ICD-10-CM | POA: Diagnosis not present

## 2015-05-04 DIAGNOSIS — I639 Cerebral infarction, unspecified: Secondary | ICD-10-CM | POA: Diagnosis not present

## 2015-05-04 DIAGNOSIS — I482 Chronic atrial fibrillation, unspecified: Secondary | ICD-10-CM

## 2015-05-04 DIAGNOSIS — I119 Hypertensive heart disease without heart failure: Secondary | ICD-10-CM

## 2015-05-04 DIAGNOSIS — D6489 Other specified anemias: Secondary | ICD-10-CM

## 2015-05-04 DIAGNOSIS — E7521 Fabry (-Anderson) disease: Secondary | ICD-10-CM | POA: Diagnosis not present

## 2015-05-04 DIAGNOSIS — E785 Hyperlipidemia, unspecified: Secondary | ICD-10-CM

## 2015-05-04 NOTE — Progress Notes (Signed)
Facility:  [facility]       Allergies  Allergen Reactions  . Lisinopril Swelling    Chief Complaint  Patient presents with  . Medical Management of Chronic Issues    Routine Visit     HPI:  She is a long term resident of this facility being seen for the management of her chronic illnesses. Overall there is little change in her status. She is unable to fully participate in the hpi or ros. There are no nursing concerns at this time. Her current weight is 141 pounds in Sept 2016 was 142 pounds and is presently stable.     Past Medical History  Diagnosis Date  . Hypertension   . 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 Expressive aphasia 03/23/2013  . Atrial fibrillation (HCC) 05/01/2013  . Tobacco abuse 05/01/2013  . Diabetes mellitus without complication (HCC)   . Depression   . Stroke (HCC)     2015  . Stroke [facility]) 2016    aphasia    Past Surgical History  Procedure Laterality Date  . Tee without cardioversion N/A 03/26/2013    Procedure: TRANSESOPHAGEAL ECHOCARDIOGRAM (TEE);  Surgeon: Thurmon Fair, MD;  Location: [facility] ENDOSCOPY;  Service: Cardiovascular;  Laterality: N/A;  . Cesarean section      x3  . Hip replacement Right 1990's  . Hip replacemet Left 1990's  . Laparoscopic gastrostomy N/A 04/20/2014    Procedure: LAPAROSCOPIC GASTROSTOMY TUBE PLACEMENT;  Surgeon: Axel Filler, MD;  Location: [facility] OR;  Service: General;  Laterality: N/A;  . Peg tube placemnt  04/15/14  . Tooth extraction Right 1/17    lower canine    VITAL SIGNS BP 131/71 mmHg  Pulse 65  Temp(Src) 97.6 F (36.4 C) (Oral)  Resp 17  Ht 5\' 4"  (1.626 m)  Wt 141 lb (63.957 kg)  BMI 24.19 kg/m2  SpO2 97%  Patient's Medications  New Prescriptions   No medications on file  Previous Medications   APIXABAN (ELIQUIS) 5 MG TABS TABLET    Take 5 mg by mouth daily.   ASPIRIN EC 81 MG TABLET    Take 81 mg by mouth daily.   ATORVASTATIN (LIPITOR) 40 MG TABLET    Take 1 tablet (40 mg total) by mouth daily at  6 PM.   CLONIDINE (CATAPRES - DOSED IN MG/24 HR) 0.1 MG/24HR PATCH    Place 0.1 mg onto the skin every Saturday.   LORATADINE (CLARITIN) 10 MG TABLET    Take 10 mg by mouth daily with breakfast.   METOPROLOL TARTRATE (LOPRESSOR) 25 MG TABLET    Take 0.5 tablets (12.5 mg total) by mouth 2 (two) times daily.   MULTIPLE VITAMIN (MULTIVITAMIN) LIQD    Take 5 mLs by mouth daily with breakfast.  Modified Medications   No medications on file  Discontinued Medications   APIXABAN (ELIQUIS) 5 MG TABS TABLET    Take 1 tablet (5 mg total) by mouth 2 (two) times daily.     SIGNIFICANT DIAGNOSTIC EXAMS   07-10-14: right neck ultrasound: no abnormality identified in the area of concern in the right submandibular region   11-27-14: chest x-ray: no acute cardiopulmonary process   03-31-15: chets x-ray: mild cardiomegaly with no acute infiltrate improved from 03-30-15     LABS REVIEWED:    06-05-14: chol 112; ldl 51; trig 83; hdl 45  06-22-14: urine culture: citrobacter koseri: cipro  10-30-14: wbc 3.5; hgb 10.6; hct 35.2; mcv 86.8; plt 196; glucose 77; bun 21.5; creat 0.72; k+4.1; na++142;  liver normal albumin 3.1 chol 143; ldl 73; trig 46; hdl 61 11-04-14: wbc 4.0; hgb 11.6; hct 40.7; mcv 89.9; plt 174 12-22-14: glucose 90; bun 25.2; creat 0.80; k+ 4.2; na++ 144  01-06-15: hgb a1c 5.7  01-12-15: peg tube site: MRSA: septra ds  03-10-15: chol 129; ldl 53; trig 60; hdl 64; urine micro-albumin 2.0  03-30-15: wbc 4.7; hgb 12.9; hct 40.6; mcv 85.4; plt 148; glucose 97; bun 41; creat 1.17; k+ 4.3; na++142  blood culture: contaminant  04-01-15: urine culture: e-coli: cipro        Review of Systems Unable to perform ROS: Patient nonverbal    Physical Exam Constitutional: No distress.  Eyes: Conjunctivae are normal.  Neck: Neck supple. No JVD present. No thyromegaly present.  Cardiovascular: Normal rate, regular rhythm and intact distal pulses.   Respiratory: Effort normal and breath sounds normal. No  respiratory distress. She has no wheezes.  GI: Soft. Bowel sounds are normal. She exhibits no distension. There is no tenderness.  Has peg tube   Musculoskeletal: She exhibits no edema.  Able to move all extremities   Lymphadenopathy:    She has no cervical adenopathy.  Neurological: She is alert.  Skin: Skin is warm and dry. She is not diaphoretic.  Psychiatric: She has a normal mood and affect.     ASSESSMENT/ PLAN:  1. 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 Dysphagia: no signs of aspiration present;   will continue on thin liquids with regular diet and will monitor   Is using peg tube for medications and supplements   3. Afib: heart rate is regular; will continue lopressor 12.5 mg twice daily for rate control; will continue eliquis 5 mg twice daily and asa 81 mg daily   4. Dyslipidemia: will continue lipitor 40 mg daily  ldl is 53   5. Hypertension: will continue lopressor 12.5 mg twice daily and clonidine 0.1 mg patch changed weekly; will monitor the lisinopril was stopped due to angioedema   6. Depression: is presently stable her lexapro has been stopped; will not make changes will monitor   7. Allergic rhinitis: will continue claritin 10 mg daily   8. CVA: has aphasia:  is neurologically without change; will continue asa 81 mg daily eliquis 5 mg twice daily   9. Diabetes: is presently not on medications; her hgb a1c is 5.7. She is on  statin and asa; urine micro-albumin 2.0    Will check hgb a1c     Synthia Innocent NP [facility]  Contact [phone] Monday through Friday 8am- 5pm  After hours call [phone]

## 2015-05-05 LAB — HEMOGLOBIN A1C: Hemoglobin A1C: 5.8

## 2015-05-14 ENCOUNTER — Other Ambulatory Visit (HOSPITAL_COMMUNITY): Payer: Self-pay | Admitting: Nephrology

## 2015-05-14 ENCOUNTER — Encounter (HOSPITAL_COMMUNITY): Payer: Self-pay

## 2015-05-14 ENCOUNTER — Ambulatory Visit (HOSPITAL_COMMUNITY)
Admission: RE | Admit: 2015-05-14 | Discharge: 2015-05-14 | Disposition: A | Discharge status: Home / Self Care | Payer: Medicare Other | Source: Ambulatory Visit | Attending: Nephrology | Admitting: Nephrology

## 2015-05-14 DIAGNOSIS — E7521 Fabry (-Anderson) disease: Secondary | ICD-10-CM | POA: Diagnosis present

## 2015-05-14 MED ORDER — ACETAMINOPHEN 160 MG/5ML PO SOLN
500.0000 mg | ORAL | Status: DC
  Administered 2015-05-14: 500 mg
  Filled 2015-05-14: qty 20.3

## 2015-05-14 MED ORDER — SODIUM CHLORIDE 0.9 % IV SOLN
INTRAVENOUS | Status: DC
  Administered 2015-05-14: 08:00:00 via INTRAVENOUS

## 2015-05-14 MED ORDER — SODIUM CHLORIDE 0.9 % IV SOLN
1.0000 mg/kg | INTRAVENOUS | Status: DC
  Administered 2015-05-14: 60 mg via INTRAVENOUS
  Filled 2015-05-14: qty 5

## 2015-05-14 MED ORDER — SODIUM CHLORIDE 0.9 % IV SOLN
1.0000 mg/kg | INTRAVENOUS | Status: DC
  Filled 2015-05-14: qty 13

## 2015-05-14 NOTE — Progress Notes (Signed)
Uneventful infusion of Fabrazyme . Pt was wearing adult diaper and placed a fresh one on her at the end of the infusion in that she had voided and has small amount of soft stool. She was then dressed and assisted to w/c per pivot and 2 staff members and awaiting transport back to her faciltiy

## 2015-05-14 NOTE — Progress Notes (Signed)
Pt has history of high blood pressure, and is on meds for this.  This am bp is 153/113.  Called [facility] and spoke with [place] and she states pt did not receive any bp meds before coming today.  About a week ago bp was charted as 164/94 before her meds according to [facility] nurse.  Will monitor pt carefully.

## 2015-05-14 NOTE — Progress Notes (Signed)
Pt has confirmed history of stroke with aphasia. Confirmed pt name with pt knodding yes to asking her name.  Pt smiling this am.  No residual via peg tube and tylenol 500mg  was given via gravity through peg tube and flushed with 20cc sterile water.  Pt resting on stretcher awaiting iv fabryzyme infusion to start.

## 2015-05-28 ENCOUNTER — Encounter (HOSPITAL_COMMUNITY)
Admission: RE | Admit: 2015-05-28 | Discharge: 2015-05-28 | Disposition: A | Discharge status: Home / Self Care | Payer: Medicare Other | Source: Ambulatory Visit | Attending: Nephrology | Admitting: Nephrology

## 2015-05-28 ENCOUNTER — Ambulatory Visit (HOSPITAL_COMMUNITY)
Admission: RE | Admit: 2015-05-28 | Discharge: 2015-05-28 | Disposition: A | Discharge status: Home / Self Care | Payer: Medicare Other | Source: Ambulatory Visit | Attending: Adult Health | Admitting: Adult Health

## 2015-05-28 ENCOUNTER — Other Ambulatory Visit: Payer: Self-pay | Admitting: Adult Health

## 2015-05-28 DIAGNOSIS — R633 Feeding difficulties, unspecified: Secondary | ICD-10-CM

## 2015-05-28 DIAGNOSIS — Z431 Encounter for attention to gastrostomy: Secondary | ICD-10-CM | POA: Diagnosis present

## 2015-05-28 DIAGNOSIS — F17200 Nicotine dependence, unspecified, uncomplicated: Secondary | ICD-10-CM | POA: Insufficient documentation

## 2015-05-28 DIAGNOSIS — R944 Abnormal results of kidney function studies: Secondary | ICD-10-CM | POA: Insufficient documentation

## 2015-05-28 DIAGNOSIS — R4701 Aphasia: Secondary | ICD-10-CM | POA: Diagnosis not present

## 2015-05-28 DIAGNOSIS — I4891 Unspecified atrial fibrillation: Secondary | ICD-10-CM | POA: Diagnosis not present

## 2015-05-28 DIAGNOSIS — E119 Type 2 diabetes mellitus without complications: Secondary | ICD-10-CM | POA: Diagnosis not present

## 2015-05-28 DIAGNOSIS — E779 Disorder of glycoprotein metabolism, unspecified: Secondary | ICD-10-CM | POA: Diagnosis not present

## 2015-05-28 DIAGNOSIS — I69391 Dysphagia following cerebral infarction: Secondary | ICD-10-CM | POA: Diagnosis not present

## 2015-05-28 DIAGNOSIS — I10 Essential (primary) hypertension: Secondary | ICD-10-CM | POA: Insufficient documentation

## 2015-05-28 LAB — CBC
HCT: 34.9 % — ABNORMAL LOW (ref 36.0–46.0)
Hemoglobin: 11.2 g/dL — ABNORMAL LOW (ref 12.0–15.0)
MCH: 28.6 pg (ref 26.0–34.0)
MCHC: 32.1 g/dL (ref 30.0–36.0)
MCV: 89 fL (ref 78.0–100.0)
PLATELETS: 156 10*3/uL (ref 150–400)
RBC: 3.92 MIL/uL (ref 3.87–5.11)
RDW: 15.1 % (ref 11.5–15.5)
WBC: 3.8 10*3/uL — AB (ref 4.0–10.5)

## 2015-05-28 LAB — COMPREHENSIVE METABOLIC PANEL
ALT: 20 U/L (ref 14–54)
ANION GAP: 9 (ref 5–15)
AST: 27 U/L (ref 15–41)
Albumin: 3.4 g/dL — ABNORMAL LOW (ref 3.5–5.0)
Alkaline Phosphatase: 29 U/L — ABNORMAL LOW (ref 38–126)
BUN: 31 mg/dL — ABNORMAL HIGH (ref 6–20)
CALCIUM: 9.1 mg/dL (ref 8.9–10.3)
CO2: 24 mmol/L (ref 22–32)
Chloride: 111 mmol/L (ref 101–111)
Creatinine, Ser: 0.83 mg/dL (ref 0.44–1.00)
GFR calc Af Amer: >60 mL/min (ref >60)
GFR calc non Af Amer: >60 mL/min (ref >60)
Glucose, Bld: 119 mg/dL — ABNORMAL HIGH (ref 65–99)
Potassium: 3.8 mmol/L (ref 3.5–5.1)
SODIUM: 144 mmol/L (ref 135–145)
TOTAL PROTEIN: 7.7 g/dL (ref 6.5–8.1)
Total Bilirubin: 0.7 mg/dL (ref 0.3–1.2)

## 2015-05-28 MED ORDER — SODIUM CHLORIDE 0.9 % IV SOLN
1.0000 mg/kg | INTRAVENOUS | Status: DC
  Administered 2015-05-28: 60 mg via INTRAVENOUS
  Filled 2015-05-28: qty 5

## 2015-05-28 MED ORDER — SODIUM CHLORIDE 0.9 % IV SOLN
INTRAVENOUS | Status: DC
  Administered 2015-05-28: 09:00:00 via INTRAVENOUS

## 2015-05-28 MED ORDER — ACETAMINOPHEN 160 MG/5ML PO SOLN
500.0000 mg | ORAL | Status: DC
  Administered 2015-05-28: 500 mg
  Filled 2015-05-28: qty 20.3

## 2015-05-28 NOTE — Progress Notes (Signed)
Orders received from [facility]. Peg Tub was exchanged by Merlene Morse from IR without difficulty. Pt resing comfortabley

## 2015-05-28 NOTE — Progress Notes (Addendum)
Pt arrived today for her Fabrazyme infusion from [facility] via w/c alert and answering simple questions. Upon assessment of her PEG tube it was found that the arm to the stopper has broken off and the stopper is in the PEG. I was able to remove the stopper  with hemostats to give her pre infusion Tylenol Elixer ( 68ml residual and then flushed with 55ml sterile water) and then replace it. Placed call to IR to ask for assistance as to how to repair the broken stopper. PEG was evaluated by Merlene Morse Radiology Tech for IR who stated that is was a Balloon Retention PEG and could be easily replaced in the room as long as I had an order from her Physician. I placed a call to [facility] and spoke with Mortimer Fries (nurse for this patient) and informed her of this and if I faxed her the order sheet she would fax it back to me to have this done. Currently awaiting orders to be faxed

## 2015-05-28 NOTE — Procedures (Signed)
Existing 18 French balloon retention gastrostomy tube exchanged out for new 18 French balloon retention gastrostomy tube secondary to broken hub. No immediate complications.

## 2015-06-01 ENCOUNTER — Non-Acute Institutional Stay (SKILLED_NURSING_FACILITY): Payer: Medicare Other | Admitting: Adult Health

## 2015-06-01 ENCOUNTER — Encounter: Payer: Self-pay | Admitting: Adult Health

## 2015-06-01 DIAGNOSIS — E7521 Fabry (-Anderson) disease: Secondary | ICD-10-CM

## 2015-06-01 DIAGNOSIS — I5043 Acute on chronic combined systolic (congestive) and diastolic (congestive) heart failure: Secondary | ICD-10-CM

## 2015-06-01 DIAGNOSIS — I480 Paroxysmal atrial fibrillation: Secondary | ICD-10-CM | POA: Diagnosis not present

## 2015-06-01 DIAGNOSIS — E1149 Type 2 diabetes mellitus with other diabetic neurological complication: Secondary | ICD-10-CM

## 2015-06-01 DIAGNOSIS — I119 Hypertensive heart disease without heart failure: Secondary | ICD-10-CM

## 2015-06-01 DIAGNOSIS — I69391 Dysphagia following cerebral infarction: Secondary | ICD-10-CM

## 2015-06-01 DIAGNOSIS — E785 Hyperlipidemia, unspecified: Secondary | ICD-10-CM

## 2015-06-01 NOTE — Progress Notes (Signed)
Patient ID: Rebecca [name], female   DOB: Feb 19, 1946, 70 y.o.   MRN: [id number]   Facility:  [facility]       Allergies  Allergen Reactions  . Lisinopril Swelling    Chief Complaint  Patient presents with  . Medical Management of Chronic Issues    Follow-up    HPI:  She is a long term resident of this facility being seen for the management of her chronic illnesses. Overall there is no significant change in her status. She cannot fully participate in the hpi or ros; but did say that she was ok. There are no nursing concerns at this time.    Past Medical History  Diagnosis Date  . Hypertension   . 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 Expressive aphasia 03/23/2013  . Atrial fibrillation (HCC) 05/01/2013  . Tobacco abuse 05/01/2013  . Diabetes mellitus without complication (HCC)   . Depression   . Stroke (HCC)     2015  . Stroke [facility]) 2016    aphasia    Past Surgical History  Procedure Laterality Date  . Tee without cardioversion N/A 03/26/2013    Procedure: TRANSESOPHAGEAL ECHOCARDIOGRAM (TEE);  Surgeon: Thurmon Fair, MD;  Location: [facility] ENDOSCOPY;  Service: Cardiovascular;  Laterality: N/A;  . Cesarean section      x3  . Hip replacement Right 1990's  . Hip replacemet Left 1990's  . Laparoscopic gastrostomy N/A 04/20/2014    Procedure: LAPAROSCOPIC GASTROSTOMY TUBE PLACEMENT;  Surgeon: Axel Filler, MD;  Location: [facility] OR;  Service: General;  Laterality: N/A;  . Peg tube placemnt  04/15/14  . Tooth extraction Right 1/17    lower canine    VITAL SIGNS BP 112/66 mmHg  Pulse 68  Temp(Src) 97.3 F (36.3 C) (Oral)  Resp 18  Ht  (1.727 m)  Wt 136 lb (61.689 kg)  BMI 20.68 kg/m2  SpO2 97%  Patient's Medications  New Prescriptions   No medications on file  Previous Medications   APIXABAN (ELIQUIS) 5 MG TABS TABLET    Take 5 mg by mouth daily.   ASPIRIN EC 81 MG TABLET    Take 81 mg by mouth daily.   ATORVASTATIN (LIPITOR) 40 MG TABLET    Take 1 tablet (40 mg  total) by mouth daily at 6 PM.   CLONIDINE (CATAPRES - DOSED IN MG/24 HR) 0.1 MG/24HR PATCH    Place 0.1 mg onto the skin every Saturday.   LORATADINE (CLARITIN) 10 MG TABLET    Take 10 mg by mouth daily with breakfast.   METOPROLOL TARTRATE (LOPRESSOR) 25 MG TABLET    Take 0.5 tablets (12.5 mg total) by mouth 2 (two) times daily.   MULTIPLE VITAMIN (MULTIVITAMIN) LIQD    Take 5 mLs by mouth daily with breakfast.  Modified Medications   No medications on file  Discontinued Medications   No medications on file     SIGNIFICANT DIAGNOSTIC EXAMS  07-10-14: right neck ultrasound: no abnormality identified in the area of concern in the right submandibular region   11-27-14: chest x-ray: no acute cardiopulmonary process   03-31-15: chets x-ray: mild cardiomegaly with no acute infiltrate improved from 03-30-15     LABS REVIEWED:    06-05-14: chol 112; ldl 51; trig 83; hdl 45  06-22-14: urine culture: citrobacter koseri: cipro  10-30-14: wbc 3.5; hgb 10.6; hct 35.2; mcv 86.8; plt 196; glucose 77; bun 21.5; creat 0.72; k+4.1; na++142; liver normal albumin 3.1 chol 143; ldl 73; trig 46; hdl 61 11-04-14:  wbc 4.0; hgb 11.6; hct 40.7; mcv 89.9; plt 174 12-22-14: glucose 90; bun 25.2; creat 0.80; k+ 4.2; na++ 144  01-06-15: hgb a1c 5.7  01-12-15: peg tube site: MRSA: septra ds  03-10-15: chol 129; ldl 53; trig 60; hdl 64; urine micro-albumin 2.0  03-30-15: wbc 4.7; hgb 12.9; hct 40.6; mcv 85.4; plt 148; glucose 97; bun 41; creat 1.17; k+ 4.3; na++142  blood culture: contaminant  04-01-15: urine culture: e-coli: cipro  05-05-15: hgb a1c 5.8        Review of Systems Unable to perform ROS: Patient nonverbal    Physical Exam Constitutional: No distress.  Eyes: Conjunctivae are normal.  Neck: Neck supple. No JVD present. No thyromegaly present.  Cardiovascular: Normal rate, regular rhythm and intact distal pulses.   Respiratory: Effort normal and breath sounds normal. No respiratory distress. She has  no wheezes.  GI: Soft. Bowel sounds are normal. She exhibits no distension. There is no tenderness.  Has peg tube   Musculoskeletal: She exhibits no edema.  Able to move all extremities   Lymphadenopathy:    She has no cervical adenopathy.  Neurological: She is alert.  Skin: Skin is warm and dry. She is not diaphoretic.  Psychiatric: She has a normal mood and affect.     ASSESSMENT/ PLAN:  1. 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 Dysphagia: no signs of aspiration present;   will continue on thin liquids with regular diet and will monitor   Is using peg tube for medications and supplements   3. Afib: heart rate is regular; will continue lopressor 12.5 mg twice daily for rate control; will continue eliquis 5 mg twice daily and asa 81 mg daily   4. Dyslipidemia: will continue lipitor 40 mg daily  ldl is 53   5. Hypertension: will continue lopressor 12.5 mg twice daily and clonidine 0.1 mg patch changed weekly; will monitor the lisinopril was stopped due to angioedema   6. Depression: is presently stable her lexapro has been stopped; will not make changes will monitor   7. Allergic rhinitis: will continue claritin 10 mg daily   8. CVA: has aphasia:  is neurologically without change; will continue asa 81 mg daily eliquis 5 mg twice daily   9. Diabetes: is presently not on medications; her hgb a1c is 5.8. She is on  statin and asa; urine micro-albumin 2.0       Synthia Innocent NP [facility]  Contact [phone] Monday through Friday 8am- 5pm  After hours call [phone]

## 2015-06-11 ENCOUNTER — Encounter (HOSPITAL_COMMUNITY): Payer: Self-pay

## 2015-06-11 ENCOUNTER — Encounter (HOSPITAL_COMMUNITY)
Admission: RE | Admit: 2015-06-11 | Discharge: 2015-06-11 | Disposition: A | Discharge status: Home / Self Care | Payer: Medicare Other | Source: Ambulatory Visit | Attending: Nephrology | Admitting: Nephrology

## 2015-06-11 DIAGNOSIS — E779 Disorder of glycoprotein metabolism, unspecified: Secondary | ICD-10-CM | POA: Diagnosis not present

## 2015-06-11 MED ORDER — ACETAMINOPHEN 160 MG/5ML PO SOLN
500.0000 mg | ORAL | Status: DC
  Administered 2015-06-11: 500 mg
  Filled 2015-06-11: qty 20.3

## 2015-06-11 MED ORDER — SODIUM CHLORIDE 0.9 % IV SOLN
INTRAVENOUS | Status: DC
  Administered 2015-06-11: 09:00:00 via INTRAVENOUS

## 2015-06-11 MED ORDER — ACETAMINOPHEN 160 MG/5ML PO SOLN: 500.0000 mg | Freq: Once | ORAL | Status: DC

## 2015-06-11 MED ORDER — SODIUM CHLORIDE 0.9 % IV SOLN
1.0000 mg/kg | INTRAVENOUS | Status: DC
  Administered 2015-06-11: 60 mg via INTRAVENOUS
  Filled 2015-06-11: qty 7

## 2015-06-11 MED ORDER — ACETAMINOPHEN 500 MG PO TABS: 500.0000 mg | ORAL_TABLET | ORAL | Status: DC

## 2015-06-11 NOTE — Discharge Instructions (Signed)
Fabrayzme Agalsidase Beta injection What is this medicine? AGALSIDASE BETA is used to replace an enzyme that is missing in patients with Fabry disease. It is not a cure. This medicine may be used for other purposes; ask your health care provider or pharmacist if you have questions. What should I tell my health care provider before I take this medicine? They need to know if you have any of these conditions: -heart disease -an unusual or allergic reaction to agalsidase beta, mannitol, other medicines, foods, dyes, or preservatives -pregnant or trying to get pregnant -breast-feeding How should I use this medicine? This medicine is for infusion into a vein. It is given by a health care professional in a hospital or clinic setting. Talk to your pediatrician regarding the use of this medicine in children. Special care may be needed. Overdosage: If you think you have taken too much of this medicine contact a poison control center or emergency room at once. NOTE: This medicine is only for you. Do not share this medicine with others. What if I miss a dose? It is important not to miss your dose. Call your doctor or health care professional if you are unable to keep an appointment. What may interact with this medicine? -amiodarone -chloroquine -gentamicin -hydroxychloroquine -monobenzone This list may not describe all possible interactions. Give your health care provider a list of all the medicines, herbs, non-prescription drugs, or dietary supplements you use. Also tell them if you smoke, drink alcohol, or use illegal drugs. Some items may interact with your medicine. What should I watch for while using this medicine? Visit your doctor or health care professional for regular checks on your progress. Tell your doctor or healthcare professional if your symptoms do not start to get better or if they get worse. There is a registry for patients with Fabry disease. The registry is used to gather  information about the disease and its effects. Talk to your health care provider if you would like to join the registry. What side effects may I notice from receiving this medicine? Side effects that you should report to your doctor or health care professional as soon as possible: -allergic reactions like skin rash, itching or hives, swelling of the face, lips, or tongue -breathing problems -chest pain, tightness -depression -dizziness -fast, irregular heart beat -swelling of the arms or legs Side effects that usually do not require medical attention (report to your doctor or health care professional if they continue or are bothersome): -aches or pains -anxiety -fever or chills at the time of injection -headache -nausea, vomiting -stomach pain, upset This list may not describe all possible side effects. Call your doctor for medical advice about side effects. You may report side effects to FDA at 1-800-FDA-1088. Where should I keep my medicine? This drug is given in a hospital or clinic and will not be stored at home. NOTE: This sheet is a summary. It may not cover all possible information. If you have questions about this medicine, talk to your doctor, pharmacist, or health care provider.    2016, Elsevier/Gold Standard. (2005-11-13 12:25:00)

## 2015-06-25 ENCOUNTER — Encounter (HOSPITAL_COMMUNITY)
Admission: RE | Admit: 2015-06-25 | Discharge: 2015-06-25 | Disposition: A | Discharge status: Home / Self Care | Payer: Medicare Other | Source: Ambulatory Visit | Attending: Nephrology | Admitting: Nephrology

## 2015-06-25 ENCOUNTER — Encounter (HOSPITAL_COMMUNITY): Payer: Self-pay

## 2015-06-25 DIAGNOSIS — F17200 Nicotine dependence, unspecified, uncomplicated: Secondary | ICD-10-CM | POA: Diagnosis not present

## 2015-06-25 DIAGNOSIS — I10 Essential (primary) hypertension: Secondary | ICD-10-CM | POA: Insufficient documentation

## 2015-06-25 DIAGNOSIS — I4891 Unspecified atrial fibrillation: Secondary | ICD-10-CM | POA: Insufficient documentation

## 2015-06-25 DIAGNOSIS — R944 Abnormal results of kidney function studies: Secondary | ICD-10-CM | POA: Diagnosis not present

## 2015-06-25 DIAGNOSIS — E779 Disorder of glycoprotein metabolism, unspecified: Secondary | ICD-10-CM | POA: Insufficient documentation

## 2015-06-25 DIAGNOSIS — R4701 Aphasia: Secondary | ICD-10-CM | POA: Diagnosis not present

## 2015-06-25 DIAGNOSIS — E119 Type 2 diabetes mellitus without complications: Secondary | ICD-10-CM | POA: Insufficient documentation

## 2015-06-25 LAB — CBC
HCT: 38 % (ref 36.0–46.0)
Hemoglobin: 12.6 g/dL (ref 12.0–15.0)
MCH: 28.6 pg (ref 26.0–34.0)
MCHC: 33.2 g/dL (ref 30.0–36.0)
MCV: 86.2 fL (ref 78.0–100.0)
PLATELETS: 163 10*3/uL (ref 150–400)
RBC: 4.41 MIL/uL (ref 3.87–5.11)
RDW: 14.6 % (ref 11.5–15.5)
WBC: 3.6 10*3/uL — AB (ref 4.0–10.5)

## 2015-06-25 LAB — COMPREHENSIVE METABOLIC PANEL
ALBUMIN: 3.7 g/dL (ref 3.5–5.0)
ALT: 18 U/L (ref 14–54)
ANION GAP: 7 (ref 5–15)
AST: 31 U/L (ref 15–41)
Alkaline Phosphatase: 31 U/L — ABNORMAL LOW (ref 38–126)
BILIRUBIN TOTAL: 0.6 mg/dL (ref 0.3–1.2)
BUN: 24 mg/dL — ABNORMAL HIGH (ref 6–20)
CHLORIDE: 112 mmol/L — AB (ref 101–111)
CO2: 24 mmol/L (ref 22–32)
Calcium: 9.1 mg/dL (ref 8.9–10.3)
Creatinine, Ser: 0.85 mg/dL (ref 0.44–1.00)
GFR calc Af Amer: >60 mL/min (ref >60)
GFR calc non Af Amer: >60 mL/min (ref >60)
GLUCOSE: 108 mg/dL — AB (ref 65–99)
POTASSIUM: 3.9 mmol/L (ref 3.5–5.1)
SODIUM: 143 mmol/L (ref 135–145)
TOTAL PROTEIN: 8.3 g/dL — AB (ref 6.5–8.1)

## 2015-06-25 MED ORDER — SODIUM CHLORIDE 0.9 % IV SOLN
1.0000 mg/kg | INTRAVENOUS | Status: DC
  Administered 2015-06-25: 60 mg via INTRAVENOUS
  Filled 2015-06-25: qty 8

## 2015-06-25 MED ORDER — ACETAMINOPHEN 160 MG/5ML PO SOLN
500.0000 mg | ORAL | Status: DC
  Administered 2015-06-25: 500 mg
  Filled 2015-06-25: qty 20.3

## 2015-06-25 MED ORDER — SODIUM CHLORIDE 0.9 % IV SOLN
INTRAVENOUS | Status: DC
  Administered 2015-06-25: 11:00:00 via INTRAVENOUS

## 2015-07-06 ENCOUNTER — Non-Acute Institutional Stay (SKILLED_NURSING_FACILITY): Payer: Medicare Other | Admitting: Adult Health

## 2015-07-06 ENCOUNTER — Encounter: Payer: Self-pay | Admitting: Adult Health

## 2015-07-06 DIAGNOSIS — I119 Hypertensive heart disease without heart failure: Secondary | ICD-10-CM | POA: Diagnosis not present

## 2015-07-06 DIAGNOSIS — I69391 Dysphagia following cerebral infarction: Secondary | ICD-10-CM

## 2015-07-06 DIAGNOSIS — E7521 Fabry (-Anderson) disease: Secondary | ICD-10-CM | POA: Diagnosis not present

## 2015-07-06 DIAGNOSIS — I6990 Unspecified sequelae of unspecified cerebrovascular disease: Secondary | ICD-10-CM | POA: Insufficient documentation

## 2015-07-06 DIAGNOSIS — E1149 Type 2 diabetes mellitus with other diabetic neurological complication: Secondary | ICD-10-CM

## 2015-07-06 DIAGNOSIS — I6930 Unspecified sequelae of cerebral infarction: Secondary | ICD-10-CM | POA: Insufficient documentation

## 2015-07-06 DIAGNOSIS — I5043 Acute on chronic combined systolic (congestive) and diastolic (congestive) heart failure: Secondary | ICD-10-CM | POA: Diagnosis not present

## 2015-07-06 DIAGNOSIS — I480 Paroxysmal atrial fibrillation: Secondary | ICD-10-CM

## 2015-07-06 DIAGNOSIS — D6489 Other specified anemias: Secondary | ICD-10-CM

## 2015-07-06 DIAGNOSIS — E785 Hyperlipidemia, unspecified: Secondary | ICD-10-CM

## 2015-07-06 DIAGNOSIS — E1169 Type 2 diabetes mellitus with other specified complication: Secondary | ICD-10-CM

## 2015-07-06 MED ORDER — CLONIDINE HCL 0.2 MG/24HR TD PTWK: 0.2000 mg | MEDICATED_PATCH | TRANSDERMAL | Status: DC

## 2015-07-06 NOTE — Progress Notes (Signed)
Patient ID: Rebecca [name], female   DOB: [birth year], 70 y.o.   MRN: [id number]   Facility:  [facility]       Allergies  Allergen Reactions  . Lisinopril Swelling    Chief Complaint  Patient presents with  . Medical Management of Chronic Issues    Follow up    HPI:  She is a long term resident of this facility being seen for the management of her chronic illnesses. She is unable to fully participate in the hpi or ros due to her aphasia. There are no nursing concerns at this time.   Past Medical History  Diagnosis Date  . Hypertension   . 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 Expressive aphasia 03/23/2013  . Atrial fibrillation (HCC) 05/01/2013  . Tobacco abuse 05/01/2013  . Diabetes mellitus without complication (HCC)   . Depression   . Stroke (HCC)     2015  . Stroke [facility]) 2016    aphasia    Past Surgical History  Procedure Laterality Date  . Tee without cardioversion N/A 03/26/2013    Procedure: TRANSESOPHAGEAL ECHOCARDIOGRAM (TEE);  Surgeon: Thurmon Fair, MD;  Location: [facility] ENDOSCOPY;  Service: Cardiovascular;  Laterality: N/A;  . Cesarean section      x3  . Hip replacement Right 1990's  . Hip replacemet Left 1990's  . Laparoscopic gastrostomy N/A 04/20/2014    Procedure: LAPAROSCOPIC GASTROSTOMY TUBE PLACEMENT;  Surgeon: Axel Filler, MD;  Location: [facility] OR;  Service: General;  Laterality: N/A;  . Peg tube placemnt  04/15/14  . Tooth extraction Right 1/17    lower canine    VITAL SIGNS BP 158/97 mmHg  Pulse 62  Temp(Src) 97.3 F (36.3 C) (Oral)  Resp 18  Ht  (1.626 m)  Wt 141 lb 4 oz (64.071 kg)  BMI 24.23 kg/m2  SpO2 95%  Patient's Medications  New Prescriptions   No medications on file  Previous Medications   APIXABAN (ELIQUIS) 5 MG TABS TABLET    Take 5 mg by mouth daily.   ASPIRIN EC 81 MG TABLET    Take 81 mg by mouth daily.   ATORVASTATIN (LIPITOR) 40 MG TABLET    Take 1 tablet (40 mg total) by mouth daily at 6 PM.   CLONIDINE (CATAPRES -  DOSED IN MG/24 HR) 0.1 MG/24HR PATCH    Place 0.1 mg onto the skin every Saturday.   LORATADINE (CLARITIN) 10 MG TABLET    Take 10 mg by mouth daily with breakfast.   METOPROLOL TARTRATE (LOPRESSOR) 25 MG TABLET    Take 0.5 tablets (12.5 mg total) by mouth 2 (two) times daily.   MULTIPLE VITAMIN (MULTIVITAMIN) LIQD    Take 5 mLs by mouth daily with breakfast.  Modified Medications   No medications on file  Discontinued Medications   No medications on file     SIGNIFICANT DIAGNOSTIC EXAMS  07-10-14: right neck ultrasound: no abnormality identified in the area of concern in the right submandibular region   11-27-14: chest x-ray: no acute cardiopulmonary process   03-31-15: chets x-ray: mild cardiomegaly with no acute infiltrate improved from 03-30-15     LABS REVIEWED:    10-30-14: wbc 3.5; hgb 10.6; hct 35.2; mcv 86.8; plt 196; glucose 77; bun 21.5; creat 0.72; k+4.1; na++142; liver normal albumin 3.1 chol 143; ldl 73; trig 46; hdl 61 11-04-14: wbc 4.0; hgb 11.6; hct 40.7; mcv 89.9; plt 174 12-22-14: glucose 90; bun 25.2; creat 0.80; k+ 4.2; na++ 144  01-06-15: hgb a1c  5.7  01-12-15: peg tube site: MRSA: septra ds  03-10-15: chol 129; ldl 53; trig 60; hdl 64; urine micro-albumin 2.0  03-30-15: wbc 4.7; hgb 12.9; hct 40.6; mcv 85.4; plt 148; glucose 97; bun 41; creat 1.17; k+ 4.3; na++142  blood culture: contaminant  04-01-15: urine culture: e-coli: cipro  05-05-15: hgb a1c 5.8        Review of Systems Unable to perform ROS: Patient nonverbal    Physical Exam Constitutional: No distress.  Eyes: Conjunctivae are normal.  Neck: Neck supple. No JVD present. No thyromegaly present.  Cardiovascular: Normal rate, regular rhythm and intact distal pulses.   Respiratory: Effort normal and breath sounds normal. No respiratory distress. She has no wheezes.  GI: Soft. Bowel sounds are normal. She exhibits no distension. There is no tenderness.  Has peg tube   Musculoskeletal: She exhibits no  edema.  Able to move all extremities   Lymphadenopathy:    She has no cervical adenopathy.  Neurological: She is alert.  Skin: Skin is warm and dry. She is not diaphoretic.  Psychiatric: She has a normal mood and affect.     ASSESSMENT/ PLAN:  1. 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 Dysphagia: no signs of aspiration present;   will continue on thin liquids with regular diet and will monitor   Is using peg tube for medications   3. Afib: heart rate is regular; will continue lopressor 12.5 mg twice daily for rate control; will continue eliquis 5 mg twice daily and asa 81 mg daily   4. Dyslipidemia: will continue lipitor 40 mg daily  ldl is 53   5. Hypertension: will continue lopressor 12.5 mg twice daily will increase her clonidine patch to 0.2 mg weekly the lisinopril was stopped due to angioedema   6. Depression: is presently stable her lexapro has been stopped; will not make changes will monitor   7. Allergic rhinitis: will continue claritin 10 mg daily   8. CVA: has aphasia:  is neurologically without change; will continue asa 81 mg daily eliquis 5 mg twice daily   9. Diabetes: is presently not on medications; her hgb a1c is 5.8. She is on  statin and asa; urine micro-albumin 2.0       Synthia Innocent NP [facility]  Contact [phone] Monday through Friday 8am- 5pm  After hours call [phone]

## 2015-07-09 ENCOUNTER — Encounter (HOSPITAL_COMMUNITY): Payer: Self-pay

## 2015-07-09 ENCOUNTER — Encounter (HOSPITAL_COMMUNITY)
Admission: RE | Admit: 2015-07-09 | Discharge: 2015-07-09 | Disposition: A | Discharge status: Home / Self Care | Payer: Medicare Other | Source: Ambulatory Visit | Attending: Nephrology | Admitting: Nephrology

## 2015-07-09 DIAGNOSIS — E779 Disorder of glycoprotein metabolism, unspecified: Secondary | ICD-10-CM | POA: Diagnosis not present

## 2015-07-09 MED ORDER — SODIUM CHLORIDE 0.9 % IV SOLN
INTRAVENOUS | Status: DC
  Administered 2015-07-09: 10:00:00 via INTRAVENOUS

## 2015-07-09 MED ORDER — SODIUM CHLORIDE 0.9 % IV SOLN
1.0000 mg/kg | INTRAVENOUS | Status: DC
  Administered 2015-07-09: 60 mg via INTRAVENOUS
  Filled 2015-07-09: qty 7

## 2015-07-09 MED ORDER — ACETAMINOPHEN 160 MG/5ML PO SOLN
500.0000 mg | ORAL | Status: DC
  Administered 2015-07-09: 500 mg
  Filled 2015-07-09: qty 20.3

## 2015-07-09 NOTE — Progress Notes (Signed)
fabrazyme infusion complete.  Pt tolerated well.  Attends were changed (urine only) and pt dressed.  Called [facility] for transport back to facility.

## 2015-07-09 NOTE — Progress Notes (Signed)
Pt alert today, smiling.  No residual in peg tube.  Gave tylenol 500mg  and then flushed with 48ml sterile water.  Stopper placed back in peg.  Pt tolerated well.

## 2015-07-14 ENCOUNTER — Encounter: Payer: Self-pay | Admitting: Adult Health

## 2015-07-14 NOTE — Progress Notes (Signed)
Patient ID: Rebecca [name], female   DOB: 12-09-1945, 70 y.o.   MRN: [id number]   Facility:  [facility]       Allergies  Allergen Reactions  . Lisinopril Swelling    Chief Complaint  Patient presents with  . Acute Visit    HPI:    Past Medical History  Diagnosis Date  . Hypertension   . 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 Expressive aphasia 03/23/2013  . Atrial fibrillation (HCC) 05/01/2013  . Tobacco abuse 05/01/2013  . Diabetes mellitus without complication (HCC)   . Depression   . Stroke (HCC)     2015  . Stroke [facility]) 2016    aphasia    Past Surgical History  Procedure Laterality Date  . Tee without cardioversion N/A 03/26/2013    Procedure: TRANSESOPHAGEAL ECHOCARDIOGRAM (TEE);  Surgeon: Thurmon Fair, MD;  Location: [facility] ENDOSCOPY;  Service: Cardiovascular;  Laterality: N/A;  . Cesarean section      x3  . Hip replacement Right 1990's  . Hip replacemet Left 1990's  . Laparoscopic gastrostomy N/A 04/20/2014    Procedure: LAPAROSCOPIC GASTROSTOMY TUBE PLACEMENT;  Surgeon: Axel Filler, MD;  Location: [facility] OR;  Service: General;  Laterality: N/A;  . Peg tube placemnt  04/15/14  . Tooth extraction Right 1/17    lower canine    VITAL SIGNS BP 123/72 mmHg  Pulse 63  Temp(Src) 98 F (36.7 C) (Oral)  Resp 16  Ht  (1.626 m)  Wt 141 lb 4 oz (64.071 kg)  BMI 24.23 kg/m2  SpO2 98%  Patient's Medications  New Prescriptions   No medications on file  Previous Medications   APIXABAN (ELIQUIS) 5 MG TABS TABLET    Take 5 mg by mouth daily.   ASPIRIN EC 81 MG TABLET    Take 81 mg by mouth daily.   ATORVASTATIN (LIPITOR) 40 MG TABLET    Take 1 tablet (40 mg total) by mouth daily at 6 PM.   CLONIDINE (CATAPRES - DOSED IN MG/24 HR) 0.2 MG/24HR PATCH    Place 1 patch (0.2 mg total) onto the skin once a week.   LORATADINE (CLARITIN) 10 MG TABLET    Take 10 mg by mouth daily with breakfast.   METOPROLOL TARTRATE (LOPRESSOR) 25 MG TABLET    Take 0.5 tablets (12.5 mg  total) by mouth 2 (two) times daily.   MULTIPLE VITAMIN (MULTIVITAMIN) LIQD    Take 5 mLs by mouth daily with breakfast.  Modified Medications   No medications on file  Discontinued Medications   No medications on file     SIGNIFICANT DIAGNOSTIC EXAMS  07-10-14: right neck ultrasound: no abnormality identified in the area of concern in the right submandibular region   11-27-14: chest x-ray: no acute cardiopulmonary process   03-31-15: chets x-ray: mild cardiomegaly with no acute infiltrate improved from 03-30-15     LABS REVIEWED:    10-30-14: wbc 3.5; hgb 10.6; hct 35.2; mcv 86.8; plt 196; glucose 77; bun 21.5; creat 0.72; k+4.1; na++142; liver normal albumin 3.1 chol 143; ldl 73; trig 46; hdl 61 11-04-14: wbc 4.0; hgb 11.6; hct 40.7; mcv 89.9; plt 174 12-22-14: glucose 90; bun 25.2; creat 0.80; k+ 4.2; na++ 144  01-06-15: hgb a1c 5.7  01-12-15: peg tube site: MRSA: septra ds  03-10-15: chol 129; ldl 53; trig 60; hdl 64; urine micro-albumin 2.0  03-30-15: wbc 4.7; hgb 12.9; hct 40.6; mcv 85.4; plt 148; glucose 97; bun 41; creat 1.17; k+ 4.3; na++142  blood  culture: contaminant  04-01-15: urine culture: e-coli: cipro  05-05-15: hgb a1c 5.8        Review of Systems Unable to perform ROS: Patient nonverbal    Physical Exam Constitutional: No distress.  Eyes: Conjunctivae are normal.  Neck: Neck supple. No JVD present. No thyromegaly present.  Cardiovascular: Normal rate, regular rhythm and intact distal pulses.   Respiratory: Effort normal and breath sounds normal. No respiratory distress. She has no wheezes.  GI: Soft. Bowel sounds are normal. She exhibits no distension. There is no tenderness.  Has peg tube   Musculoskeletal: She exhibits no edema.  Able to move all extremities   Lymphadenopathy:    She has no cervical adenopathy.  Neurological: She is alert.  Skin: Skin is warm and dry. She is not diaphoretic.  Psychiatric: She has a normal mood and affect.      ASSESSMENT/ PLAN:  1. 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 Dysphagia: no signs of aspiration present;   will continue on thin liquids with regular diet and will monitor   Is using peg tube for medications   3. Afib: heart rate is regular; will continue lopressor 12.5 mg twice daily for rate control; will continue eliquis 5 mg twice daily and asa 81 mg daily   4. Dyslipidemia: will continue lipitor 40 mg daily  ldl is 53   5. Hypertension: will continue lopressor 12.5 mg twice daily will increase her clonidine patch to 0.2 mg weekly the lisinopril was stopped due to angioedema   6. Depression: is presently stable her lexapro has been stopped; will not make changes will monitor   7. Allergic rhinitis: will continue claritin 10 mg daily   8. CVA: has aphasia:  is neurologically without change; will continue asa 81 mg daily eliquis 5 mg twice daily   9. Diabetes: is presently not on medications; her hgb a1c is 5.8. She is on  statin and asa; urine micro-albumin 2.0           Synthia Innocent NP [facility]  Contact [phone] Monday through Friday 8am- 5pm  After hours call [phone]

## 2015-07-14 NOTE — Progress Notes (Signed)
This encounter was created in error - please disregard.

## 2015-07-18 ENCOUNTER — Encounter (HOSPITAL_COMMUNITY): Payer: Self-pay | Admitting: Emergency Medicine

## 2015-07-18 ENCOUNTER — Inpatient Hospital Stay (HOSPITAL_COMMUNITY)
Admission: EM | Admit: 2015-07-18 | Discharge: 2015-07-23 | DRG: 394 | Disposition: A | Discharge status: Skilled Nursing Facility | Payer: Medicare Other | Attending: Internal Medicine | Admitting: Internal Medicine

## 2015-07-18 DIAGNOSIS — R001 Bradycardia, unspecified: Secondary | ICD-10-CM | POA: Diagnosis not present

## 2015-07-18 DIAGNOSIS — K921 Melena: Secondary | ICD-10-CM | POA: Diagnosis present

## 2015-07-18 DIAGNOSIS — R1310 Dysphagia, unspecified: Secondary | ICD-10-CM | POA: Diagnosis present

## 2015-07-18 DIAGNOSIS — I69391 Dysphagia following cerebral infarction: Secondary | ICD-10-CM | POA: Diagnosis not present

## 2015-07-18 DIAGNOSIS — I119 Hypertensive heart disease without heart failure: Secondary | ICD-10-CM | POA: Diagnosis present

## 2015-07-18 DIAGNOSIS — E7521 Fabry (-Anderson) disease: Secondary | ICD-10-CM | POA: Diagnosis present

## 2015-07-18 DIAGNOSIS — I69319 Unspecified symptoms and signs involving cognitive functions following cerebral infarction: Secondary | ICD-10-CM

## 2015-07-18 DIAGNOSIS — I6990 Unspecified sequelae of unspecified cerebrovascular disease: Secondary | ICD-10-CM | POA: Diagnosis not present

## 2015-07-18 DIAGNOSIS — Z888 Allergy status to other drugs, medicaments and biological substances status: Secondary | ICD-10-CM | POA: Diagnosis not present

## 2015-07-18 DIAGNOSIS — Z7982 Long term (current) use of aspirin: Secondary | ICD-10-CM | POA: Diagnosis not present

## 2015-07-18 DIAGNOSIS — D638 Anemia in other chronic diseases classified elsewhere: Secondary | ICD-10-CM | POA: Diagnosis present

## 2015-07-18 DIAGNOSIS — I69320 Aphasia following cerebral infarction: Secondary | ICD-10-CM | POA: Diagnosis not present

## 2015-07-18 DIAGNOSIS — Z96643 Presence of artificial hip joint, bilateral: Secondary | ICD-10-CM | POA: Diagnosis present

## 2015-07-18 DIAGNOSIS — K649 Unspecified hemorrhoids: Secondary | ICD-10-CM | POA: Diagnosis not present

## 2015-07-18 DIAGNOSIS — I5042 Chronic combined systolic (congestive) and diastolic (congestive) heart failure: Secondary | ICD-10-CM | POA: Diagnosis present

## 2015-07-18 DIAGNOSIS — E785 Hyperlipidemia, unspecified: Secondary | ICD-10-CM | POA: Diagnosis present

## 2015-07-18 DIAGNOSIS — D62 Acute posthemorrhagic anemia: Secondary | ICD-10-CM | POA: Diagnosis present

## 2015-07-18 DIAGNOSIS — E1149 Type 2 diabetes mellitus with other diabetic neurological complication: Secondary | ICD-10-CM | POA: Diagnosis present

## 2015-07-18 DIAGNOSIS — E119 Type 2 diabetes mellitus without complications: Secondary | ICD-10-CM

## 2015-07-18 DIAGNOSIS — I482 Chronic atrial fibrillation, unspecified: Secondary | ICD-10-CM | POA: Diagnosis present

## 2015-07-18 DIAGNOSIS — Z931 Gastrostomy status: Secondary | ICD-10-CM | POA: Diagnosis not present

## 2015-07-18 DIAGNOSIS — Z7901 Long term (current) use of anticoagulants: Secondary | ICD-10-CM

## 2015-07-18 DIAGNOSIS — K922 Gastrointestinal hemorrhage, unspecified: Secondary | ICD-10-CM | POA: Diagnosis present

## 2015-07-18 DIAGNOSIS — Z87440 Personal history of urinary (tract) infections: Secondary | ICD-10-CM | POA: Diagnosis not present

## 2015-07-18 DIAGNOSIS — I110 Hypertensive heart disease with heart failure: Secondary | ICD-10-CM | POA: Diagnosis present

## 2015-07-18 LAB — HEMOGLOBIN AND HEMATOCRIT, BLOOD
HEMATOCRIT: 30.8 % — AB (ref 36.0–46.0)
Hemoglobin: 10.3 g/dL — ABNORMAL LOW (ref 12.0–15.0)

## 2015-07-18 LAB — CBC
HEMATOCRIT: 34.8 % — AB (ref 36.0–46.0)
HEMOGLOBIN: 11.5 g/dL — AB (ref 12.0–15.0)
MCH: 28.7 pg (ref 26.0–34.0)
MCHC: 33 g/dL (ref 30.0–36.0)
MCV: 86.8 fL (ref 78.0–100.0)
Platelets: 147 10*3/uL — ABNORMAL LOW (ref 150–400)
RBC: 4.01 MIL/uL (ref 3.87–5.11)
RDW: 14.5 % (ref 11.5–15.5)
WBC: 4 10*3/uL (ref 4.0–10.5)

## 2015-07-18 LAB — URINALYSIS, ROUTINE W REFLEX MICROSCOPIC
BILIRUBIN URINE: NEGATIVE
Glucose, UA: NEGATIVE mg/dL
Hgb urine dipstick: NEGATIVE
Ketones, ur: NEGATIVE mg/dL
NITRITE: NEGATIVE
PH: 6 (ref 5.0–8.0)
Protein, ur: NEGATIVE mg/dL
SPECIFIC GRAVITY, URINE: 1.022 (ref 1.005–1.030)

## 2015-07-18 LAB — URINE MICROSCOPIC-ADD ON

## 2015-07-18 LAB — PROTIME-INR
INR: 1.56 — AB (ref 0.00–1.49)
PROTHROMBIN TIME: 18.7 s — AB (ref 11.6–15.2)

## 2015-07-18 LAB — BASIC METABOLIC PANEL
ANION GAP: 8 (ref 5–15)
BUN: 28 mg/dL — ABNORMAL HIGH (ref 6–20)
CHLORIDE: 111 mmol/L (ref 101–111)
CO2: 24 mmol/L (ref 22–32)
CREATININE: 0.82 mg/dL (ref 0.44–1.00)
Calcium: 9.3 mg/dL (ref 8.9–10.3)
GFR calc non Af Amer: >60 mL/min (ref >60)
Glucose, Bld: 100 mg/dL — ABNORMAL HIGH (ref 65–99)
POTASSIUM: 4.4 mmol/L (ref 3.5–5.1)
SODIUM: 143 mmol/L (ref 135–145)

## 2015-07-18 LAB — POC OCCULT BLOOD, ED: Fecal Occult Bld: POSITIVE — AB

## 2015-07-18 MED ORDER — OXYCODONE HCL 5 MG PO TABS: 5.0000 mg | ORAL_TABLET | ORAL | Status: DC | PRN

## 2015-07-18 MED ORDER — ACETAMINOPHEN 325 MG PO TABS: 650.0000 mg | ORAL_TABLET | Freq: Four times a day (QID) | ORAL | Status: DC | PRN

## 2015-07-18 MED ORDER — PANTOPRAZOLE SODIUM 40 MG IV SOLR
40.0000 mg | INTRAVENOUS | Status: DC
  Administered 2015-07-18 – 2015-07-22 (×5): 40 mg via INTRAVENOUS
  Filled 2015-07-18 (×6): qty 40

## 2015-07-18 MED ORDER — METOPROLOL TARTRATE 12.5 MG HALF TABLET
12.5000 mg | ORAL_TABLET | Freq: Two times a day (BID) | ORAL | Status: DC
  Administered 2015-07-19: 12.5 mg via ORAL
  Filled 2015-07-18 (×4): qty 1

## 2015-07-18 MED ORDER — ONDANSETRON HCL 4 MG/2ML IJ SOLN: 4.0000 mg | Freq: Four times a day (QID) | INTRAMUSCULAR | Status: DC | PRN

## 2015-07-18 MED ORDER — ATORVASTATIN CALCIUM 40 MG PO TABS
40.0000 mg | ORAL_TABLET | Freq: Every day | ORAL | Status: DC
  Administered 2015-07-19 – 2015-07-22 (×4): 40 mg via ORAL
  Filled 2015-07-18 (×6): qty 1

## 2015-07-18 MED ORDER — ONDANSETRON HCL 4 MG PO TABS: 4.0000 mg | ORAL_TABLET | Freq: Four times a day (QID) | ORAL | Status: DC | PRN

## 2015-07-18 MED ORDER — HYDROMORPHONE HCL 1 MG/ML IJ SOLN: 0.5000 mg | INTRAMUSCULAR | Status: DC | PRN

## 2015-07-18 MED ORDER — ACETAMINOPHEN 650 MG RE SUPP: 650.0000 mg | Freq: Four times a day (QID) | RECTAL | Status: DC | PRN

## 2015-07-18 MED ORDER — SODIUM CHLORIDE 0.9 % IV SOLN
INTRAVENOUS | Status: DC
  Administered 2015-07-18: via INTRAVENOUS

## 2015-07-18 NOTE — H&P (Addendum)
[facility] Admission History and Physical       Rebecca [name] OJJ:[id number] DOB: 1946/02/22 DOA: 07/18/2015  Referring physician:  EDP PCP: [name], MD  Specialists:   Chief Complaint: Rectal Bleeding  HPI: Rebecca [name] is a 70 y.o. female with a history of CVA with residual Expressive Aphasia, Atrial Fibrillation on Eliquis Rx, HTN, DM2, and Hyperlipidemia who was sent from the [facility] SNF to the ED due to Hematochezia x 4-5 days.   Her initial hemoglobin was found to be 11. 5.    She denies any ABD pain.       Review of Systems:   Constitutional: No Weight Loss, No Weight Gain, Night Sweats, Fevers, Chills, Dizziness, Light Headedness, Fatigue, or Generalized Weakness HEENT: No Headaches, Difficulty Swallowing,Tooth/Dental Problems,Sore Throat,  No Sneezing, Rhinitis, Ear Ache, Nasal Congestion, or Post Nasal Drip,  Cardio-vascular:  No Chest pain, Orthopnea, PND, Edema in Lower Extremities, Anasarca, Dizziness, Palpitations  Resp: No Dyspnea, No DOE, No Productive Cough, No Non-Productive Cough, No Hemoptysis, No Wheezing.    GI: No Heartburn, Indigestion, Abdominal Pain, Nausea, Vomiting, Diarrhea, Constipation, +Hematemesis, +Hematochezia, Melena, Change in Bowel Habits,  Loss of Appetite  GU: No Dysuria, No Change in Color of Urine, No Urgency or Urinary Frequency, No Flank pain.  Musculoskeletal: No Joint Pain or Swelling, No Decreased Range of Motion, No Back Pain.  Neurologic: No Syncope, No Seizures, Muscle Weakness, Paresthesia, Vision Disturbance or Loss, No Diplopia, No Vertigo, No Difficulty Walking,  Skin: No Rash or Lesions. Psych: No Change in Mood or Affect, No Depression or Anxiety, No Memory loss, No Confusion, or Hallucinations   Past Medical History  Diagnosis Date  . Hypertension   . 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 Expressive aphasia 03/23/2013  . Atrial fibrillation (HCC) 05/01/2013  . Tobacco abuse 05/01/2013  .  Diabetes mellitus without complication (HCC)   . Depression   . Stroke (HCC)     2015  . Stroke [facility] 2016    aphasia     Past Surgical History  Procedure Laterality Date  . Tee without cardioversion N/A 03/26/2013    Procedure: TRANSESOPHAGEAL ECHOCARDIOGRAM (TEE);  Surgeon: Thurmon Fair, MD;  Location: [facility] ENDOSCOPY;  Service: Cardiovascular;  Laterality: N/A;  . Cesarean section      x3  . Hip replacement Right 1990's  . Hip replacemet Left 1990's  . Laparoscopic gastrostomy N/A 04/20/2014    Procedure: LAPAROSCOPIC GASTROSTOMY TUBE PLACEMENT;  Surgeon: Axel Filler, MD;  Location: [facility] OR;  Service: General;  Laterality: N/A;  . Peg tube placemnt  04/15/14  . Tooth extraction Right 1/17    lower canine      Prior to Admission medications   Medication Sig Start Date End Date Taking? Authorizing Provider  apixaban (ELIQUIS) 5 MG TABS tablet Take 5 mg by mouth daily.   Yes Historical Provider, MD  aspirin EC 81 MG tablet Take 81 mg by mouth daily.   Yes Historical Provider, MD  atorvastatin (LIPITOR) 40 MG tablet Take 1 tablet (40 mg total) by mouth daily at 6 PM. 03/27/13  Yes Myra Rude, MD  cloNIDine (CATAPRES - DOSED IN MG/24 HR) 0.2 mg/24hr patch Place 1 patch (0.2 mg total) onto the skin once a week. 07/06/15  Yes Sharee Holster, NP  hydrocortisone (ANUSOL-HC) 25 MG suppository Place 25 mg rectally 2 (two) times daily. May also use q12hprn for hemorrhoids   Yes Historical Provider, MD  loratadine (CLARITIN) 10 MG tablet  Take 10 mg by mouth daily with breakfast.   Yes Historical Provider, MD  metoprolol tartrate (LOPRESSOR) 25 MG tablet Take 0.5 tablets (12.5 mg total) by mouth 2 (two) times daily. 04/07/14  Yes Drema Dallas, MD  Multiple Vitamin (MULTIVITAMIN) LIQD Take 5 mLs by mouth daily with breakfast.   Yes Historical Provider, MD  polyethylene glycol (MIRALAX / GLYCOLAX) packet Take 17 g by mouth daily.   Yes Historical Provider, MD     Allergies  Allergen  Reactions  . Lisinopril Swelling    Social History:  reports that she has never smoked. She does not have any smokeless tobacco history on file. She reports that she does not drink alcohol or use illicit drugs.    Family History  Problem Relation Age of Onset  . Alcohol abuse Maternal Aunt        Physical Exam:  GEN:   Pleasant Elderly  70 y.o. African American female examined and in no acute distress;  Filed Vitals:   07/18/15 1828 07/18/15 1840  BP:  138/91  Pulse:  56  Temp:  98 F (36.7 C)  TempSrc:  Oral  Resp:  22  SpO2: 97% 99%   Blood pressure 138/91, pulse 56, temperature 98 F (36.7 C), temperature source Oral, resp. rate 22, SpO2 99 %. PSYCH: She is alert and oriented x4; does not appear anxious does not appear depressed; affect is normal HEENT: Normocephalic and Atraumatic, Mucous membranes pink; PERRLA; EOM intact; Fundi:  Benign;  No scleral icterus, Nares: Patent, Oropharynx: Clear, Sparse Dentition,    Neck:  FROM, No Cervical Lymphadenopathy nor Thyromegaly or Carotid Bruit; No JVD; Breasts:: Not examined CHEST WALL: No tenderness CHEST: Normal respiration, clear to auscultation bilaterally HEART: Regular rate and rhythm; no murmurs rubs or gallops BACK: No kyphosis or scoliosis; No CVA tenderness ABDOMEN: Positive Bowel Sounds, Soft Non-Tender, No Rebound or Guarding; No Masses, No Organomegaly. Rectal Exam: Not done EXTREMITIES: No Cyanosis, Clubbing, Trace Edema; No Ulcerations. Genitalia: not examined PULSES: 2+ and symmetric SKIN: Normal hydration no rash or ulceration CNS:  Alert and Oriented x 4,  Vascular: pulses palpable throughout    Labs on Admission:  Basic Metabolic Panel:  Recent Labs Lab 07/18/15 2004  NA 143  K 4.4  CL 111  CO2 24  GLUCOSE 100*  BUN 28*  CREATININE 0.82  CALCIUM 9.3   Liver Function Tests: No results for input(s): AST, ALT, ALKPHOS, BILITOT, PROT, ALBUMIN in the last 168 hours. No results for input(s):  LIPASE, AMYLASE in the last 168 hours. No results for input(s): AMMONIA in the last 168 hours. CBC:  Recent Labs Lab 07/18/15 2004  WBC 4.0  HGB 11.5*  HCT 34.8*  MCV 86.8  PLT 147*   Cardiac Enzymes: No results for input(s): CKTOTAL, CKMB, CKMBINDEX, TROPONINI in the last 168 hours.  BNP (last 3 results) No results for input(s): BNP in the last 8760 hours.  ProBNP (last 3 results) No results for input(s): PROBNP in the last 8760 hours.  CBG: No results for input(s): GLUCAP in the last 168 hours.  Radiological Exams on Admission: No results found.   EKG: Independently reviewed.     Assessment/Plan:   70 y.o. female with  Principal Problem:    Lower GI bleeding   Type and Screen sent   Monitor H/Hs   Transfuse PRN Hb < 7.0 or if Symptomatic   Hold Eliquis Rx   GI Consulted to see in AM   Active Problems:  Chronic atrial fibrillation (HCC)   Continue Metoprolol   Hold Eliquis Rx due to GI bleed      Diabetes type 2, controlled (HCC)   SSI coverage PRN      Hyperlipidemia   Continue Atorvastatin Rx      Benign hypertensive heart disease without heart failure   Continue Metoprolol   Hold Clonidine Patch   PRN IV hydralazine   Monitor BPs      Late effects of CVA (cerebrovascular accident)   chronic    DVT Prophylaxis   SCDs     Code Status:     FULL CODE      Family Communication:   No Family Present    Disposition Plan:    Inpatient Status        Time spent: 4 Minutes      Ron Parker [facility] Pager [phone]   If 7AM -7PM Please Contact the Day Rounding Team MD for [facility]  If 7PM-7AM, Please Contact Night-Floor Coverage  [email or web address] Password [email or web address] 07/18/2015, 9:53 PM     ADDENDUM:   Patient was seen and examined on 07/18/2015

## 2015-07-18 NOTE — ED Notes (Signed)
Patient arrived per [facility]. EMS. From [facility]. Per EMS, facility reports patient has been urinating blood for one week and reports no other symptoms. Patient has history of Atrial Fib and is on blood thinners. EMS reports patient is not communicating, she has spoken few words, smiles but is unable to answer when asked if she is in pain. EMS was unable to speak to staff member at [facility] to confirm patients baseline.

## 2015-07-18 NOTE — ED Notes (Signed)
One unsuccessful IV attempt by this Clinical research associate. Will ask phlebotomy to attempt blood draw.

## 2015-07-18 NOTE — ED Provider Notes (Signed)
CSN: [id number]     Arrival date & time 07/18/15  1820 History   First MD Initiated Contact with Patient 07/18/15 1842     Chief Complaint  Patient presents with  . Hematuria     Level V caveat: Expressive aphasia  HPI  Patient presents to the emergency department from the nursing facility with complaints of urinating blood for 1 week per the nursing home.  Patient has expressive aphasia and is unable to provide any history.  There is blood noted to be in her diaper.  She is on Eliquis for history of atrial fibrillation.      Past Medical History  Diagnosis Date  . Hypertension   . 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 Expressive aphasia 03/23/2013  . Atrial fibrillation (HCC) 05/01/2013  . Tobacco abuse 05/01/2013  . Diabetes mellitus without complication (HCC)   . Depression   . Stroke (HCC)     2015  . Stroke [facility]) 2016    aphasia   Past Surgical History  Procedure Laterality Date  . Tee without cardioversion N/A 03/26/2013    Procedure: TRANSESOPHAGEAL ECHOCARDIOGRAM (TEE);  Surgeon: Thurmon Fair, MD;  Location: [facility] ENDOSCOPY;  Service: Cardiovascular;  Laterality: N/A;  . Cesarean section      x3  . Hip replacement Right 1990's  . Hip replacemet Left 1990's  . Laparoscopic gastrostomy N/A 04/20/2014    Procedure: LAPAROSCOPIC GASTROSTOMY TUBE PLACEMENT;  Surgeon: Axel Filler, MD;  Location: [facility] OR;  Service: General;  Laterality: N/A;  . Peg tube placemnt  04/15/14  . Tooth extraction Right 1/17    lower canine   Family History  Problem Relation Age of Onset  . Alcohol abuse Maternal Aunt    Social History  Substance Use Topics  . Smoking status: Never Smoker   . Smokeless tobacco: None  . Alcohol Use: No   OB History    No data available     Review of Systems  Unable to perform ROS: Patient nonverbal      Allergies  Lisinopril  Home Medications   Prior to Admission medications   Medication Sig Start Date End Date Taking? Authorizing Provider   apixaban (ELIQUIS) 5 MG TABS tablet Take 5 mg by mouth daily.   Yes Historical Provider, MD  aspirin EC 81 MG tablet Take 81 mg by mouth daily.   Yes Historical Provider, MD  atorvastatin (LIPITOR) 40 MG tablet Take 1 tablet (40 mg total) by mouth daily at 6 PM. 03/27/13  Yes Myra Rude, MD  cloNIDine (CATAPRES - DOSED IN MG/24 HR) 0.2 mg/24hr patch Place 1 patch (0.2 mg total) onto the skin once a week. 07/06/15  Yes Sharee Holster, NP  hydrocortisone (ANUSOL-HC) 25 MG suppository Place 25 mg rectally 2 (two) times daily. May also use q12hprn for hemorrhoids   Yes Historical Provider, MD  loratadine (CLARITIN) 10 MG tablet Take 10 mg by mouth daily with breakfast.   Yes Historical Provider, MD  metoprolol tartrate (LOPRESSOR) 25 MG tablet Take 0.5 tablets (12.5 mg total) by mouth 2 (two) times daily. 04/07/14  Yes Drema Dallas, MD  Multiple Vitamin (MULTIVITAMIN) LIQD Take 5 mLs by mouth daily with breakfast.   Yes Historical Provider, MD  polyethylene glycol (MIRALAX / GLYCOLAX) packet Take 17 g by mouth daily.   Yes Historical Provider, MD   BP 138/91 mmHg  Pulse 56  Temp(Src) 98 F (36.7 C) (Oral)  Resp 22  SpO2 99% Physical Exam  Constitutional: She  appears well-developed and well-nourished. No distress.  HENT:  Head: Normocephalic and atraumatic.  Eyes: EOM are normal.  Neck: Normal range of motion.  Cardiovascular: Normal rate, regular rhythm and normal heart sounds.   Pulmonary/Chest: Effort normal and breath sounds normal.  Abdominal: Soft. She exhibits no distension. There is no tenderness.  Genitourinary:  Hematochezia noted on rectal exam.  No external hemorrhoids noted.  Digital vaginal exam performed with no gross blood.  Chaperone present for both rectal and vaginal examination  Musculoskeletal: Normal range of motion.  Neurological: She is alert.  Skin: Skin is warm and dry.  Psychiatric: She has a normal mood and affect. Judgment normal.  Nursing note and vitals  reviewed.   ED Course  Procedures (including critical care time) Labs Review Labs Reviewed  CBC - Abnormal; Notable for the following:    Hemoglobin 11.5 (*)    HCT 34.8 (*)    Platelets 147 (*)    All other components within normal limits  BASIC METABOLIC PANEL - Abnormal; Notable for the following:    Glucose, Bld 100 (*)    BUN 28 (*)    All other components within normal limits  URINALYSIS, ROUTINE W REFLEX MICROSCOPIC (NOT AT [facility]) - Abnormal; Notable for the following:    Leukocytes, UA SMALL (*)    All other components within normal limits  PROTIME-INR - Abnormal; Notable for the following:    Prothrombin Time 18.7 (*)    INR 1.56 (*)    All other components within normal limits  URINE MICROSCOPIC-ADD ON - Abnormal; Notable for the following:    Squamous Epithelial / LPF 6-30 (*)    Bacteria, UA MANY (*)    All other components within normal limits  POC OCCULT BLOOD, ED    Imaging Review No results found. I have personally reviewed and evaluated these images and lab results as part of my medical decision-making.   EKG Interpretation None      MDM   Final diagnoses:  None    Lower GI bleed on Eliquis.  Vital signs stable.  Hemoglobin stable.  Patient will be admitted to the hospital for ongoing workup.  She has seen [place] [facility] for the past and I will discuss the case with them and they will likely need to see her in the morning.  At this time there is no indication for urgent/emergent GI evaluation or treatment    Azalia Bilis, MD 07/18/15 2140

## 2015-07-18 NOTE — ED Notes (Signed)
Bed: WA01 Expected date: 07/18/15 Expected time: 6:19 PM Means of arrival: Ambulance Comments: Blood in Urine

## 2015-07-19 DIAGNOSIS — K922 Gastrointestinal hemorrhage, unspecified: Secondary | ICD-10-CM

## 2015-07-19 LAB — CBC
HCT: 29.5 % — ABNORMAL LOW (ref 36.0–46.0)
HEMOGLOBIN: 9.9 g/dL — AB (ref 12.0–15.0)
MCH: 28.6 pg (ref 26.0–34.0)
MCHC: 33.6 g/dL (ref 30.0–36.0)
MCV: 85.3 fL (ref 78.0–100.0)
Platelets: 151 10*3/uL (ref 150–400)
RBC: 3.46 MIL/uL — AB (ref 3.87–5.11)
RDW: 14.5 % (ref 11.5–15.5)
WBC: 3.7 10*3/uL — AB (ref 4.0–10.5)

## 2015-07-19 LAB — BASIC METABOLIC PANEL
ANION GAP: 8 (ref 5–15)
BUN: 26 mg/dL — ABNORMAL HIGH (ref 6–20)
CALCIUM: 8.9 mg/dL (ref 8.9–10.3)
CO2: 25 mmol/L (ref 22–32)
Chloride: 112 mmol/L — ABNORMAL HIGH (ref 101–111)
Creatinine, Ser: 0.74 mg/dL (ref 0.44–1.00)
Glucose, Bld: 103 mg/dL — ABNORMAL HIGH (ref 65–99)
POTASSIUM: 4 mmol/L (ref 3.5–5.1)
Sodium: 145 mmol/L (ref 135–145)

## 2015-07-19 LAB — TYPE AND SCREEN
ABO/RH(D): AB POS
ANTIBODY SCREEN: POSITIVE
DAT, IGG: NEGATIVE

## 2015-07-19 LAB — MRSA PCR SCREENING: MRSA BY PCR: POSITIVE — AB

## 2015-07-19 LAB — HEMOGLOBIN AND HEMATOCRIT, BLOOD
HCT: 29.5 % — ABNORMAL LOW (ref 36.0–46.0)
HEMATOCRIT: 28.4 % — AB (ref 36.0–46.0)
HEMOGLOBIN: 9.5 g/dL — AB (ref 12.0–15.0)
Hemoglobin: 9.7 g/dL — ABNORMAL LOW (ref 12.0–15.0)

## 2015-07-19 LAB — ABO/RH: ABO/RH(D): AB POS

## 2015-07-19 MED ORDER — CHLORHEXIDINE GLUCONATE CLOTH 2 % EX PADS
6.0000 | MEDICATED_PAD | Freq: Every day | CUTANEOUS | Status: DC
  Administered 2015-07-19 – 2015-07-23 (×4): 6 via TOPICAL

## 2015-07-19 MED ORDER — MUPIROCIN 2 % EX OINT
1.0000 "application " | TOPICAL_OINTMENT | Freq: Two times a day (BID) | CUTANEOUS | Status: DC
  Administered 2015-07-19 – 2015-07-23 (×9): 1 via NASAL
  Filled 2015-07-19: qty 22

## 2015-07-19 MED ORDER — LIP MEDEX EX OINT
TOPICAL_OINTMENT | CUTANEOUS | Status: AC
  Administered 2015-07-19: 10:00:00
  Filled 2015-07-19: qty 7

## 2015-07-19 NOTE — Progress Notes (Signed)
MD notes state pt having bloody urine.  When go to turn pt she has had a pool of blood in between her legs not necessarily with urine.  Could be from more than one source but rectum appears to definitely be one source. Barnett Hatter P

## 2015-07-19 NOTE — NC FL2 (Addendum)
[place] MEDICAID FL2 LEVEL OF CARE SCREENING TOOL     IDENTIFICATION  Patient Name: Rebecca [name] Birthdate: 1945-11-16 Sex: female Admission Date (Current Location): 07/18/2015  [facility] and [place] Number:  Producer, television/film/video and Address:  [facility],  [address], [place] [address]      Provider Number: [id number]  Attending Physician Name and Address:  Catarina Hartshorn, MD  Relative Name and Phone Number:       Current Level of Care: Hospital Recommended Level of Care: Skilled Nursing Facility Prior Approval Number:    Date Approved/Denied:   PASRR Number:    Discharge Plan: SNF (return to SNF: [facility])    Current Diagnoses: Patient Active Problem List   Diagnosis Date Noted  . Lower GI bleed 07/18/2015  . Lower GI bleeding 07/18/2015  . Late effects of CVA (cerebrovascular accident) 07/06/2015  . Dyslipidemia associated with type 2 diabetes mellitus (HCC) 07/06/2015  . UTI (urinary tract infection) 04/18/2015  . E. coli UTI 04/05/2015  . Angioedema of lips 03/22/2015  . 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 Anemia due to other cause 11/03/2014  . Dysphagia S/P CVA (cerebrovascular accident) 04/22/2014  . 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 Protein-calorie malnutrition, severe (HCC) 04/05/2014  . Global aphasia   . Paroxysmal atrial fibrillation (HCC)   . Hyperlipidemia   . Acute on chronic combined systolic and diastolic CHF (congestive heart failure) (HCC)   . Pulmonary hypertension (HCC)   . Chronic atrial fibrillation (HCC)   . Diabetes type 2, controlled (HCC)   . 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 Expressive aphasia 03/23/2013    Orientation RESPIRATION BLADDER Height & Weight     Self, Place  Normal Incontinent Weight: 141 lb 4 oz (64.071 kg) Height:   (162.6 cm)  BEHAVIORAL  SYMPTOMS/MOOD NEUROLOGICAL BOWEL NUTRITION STATUS  Other (Comment) (none)   stable Incontinent DSY 3, Thin Liquids  AMBULATORY STATUS COMMUNICATION OF NEEDS Skin   Extensive Assist Verbally Normal                       Personal Care Assistance Level of Assistance  Bathing, Feeding, Dressing Bathing Assistance: Limited assistance Feeding assistance: Independent Dressing Assistance: Limited assistance     Functional Limitations Info  Sight, Hearing, Speech Sight Info: Adequate Hearing Info: Adequate Speech Info: Adequate    SPECIAL CARE FACTORS FREQUENCY                       Contractures Contractures Info: Not present    Additional Factors Info  Code Status, Allergies, Psychotropic, Insulin Sliding Scale, Isolation Precautions Code Status Info: Full Allergies Info: Lisinopril Psychotropic Info: none  Insulin Sliding Scale Info: none Isolation Precautions Info: yes, contact: MRSA     Current Medications (07/19/2015):  This is the current hospital active medication list Current Facility-Administered Medications  Medication Dose Route Frequency Provider Last Rate Last Dose  . acetaminophen (TYLENOL) tablet 650 mg  650 mg Oral Q6H PRN Ron Parker, MD       Or  . acetaminophen (TYLENOL) suppository 650 mg  650 mg Rectal Q6H PRN Ron Parker, MD      . atorvastatin (LIPITOR) tablet 40 mg  40 mg Oral q1800 Ron Parker, MD      . Chlorhexidine Gluconate Cloth 2 % PADS 6 each  6 each Topical Q0600 Ron Parker, MD   6 each at 07/19/15 1000  . HYDROmorphone (DILAUDID) injection 0.5-1 mg  0.5-1 mg Intravenous Q3H PRN Ron Parker, MD      . metoprolol tartrate (LOPRESSOR) tablet 12.5 mg  12.5 mg Oral BID Ron Parker, MD   12.5 mg at 07/19/15 1011  . mupirocin ointment (BACTROBAN) 2 % 1 application  1 application Nasal BID Ron Parker, MD   1 application at 07/19/15 1010  . ondansetron (ZOFRAN) tablet 4 mg  4 mg Oral Q6H PRN  Ron Parker, MD       Or  . ondansetron (ZOFRAN) injection 4 mg  4 mg Intravenous Q6H PRN Ron Parker, MD      . oxyCODONE (Oxy IR/ROXICODONE) immediate release tablet 5 mg  5 mg Oral Q4H PRN Ron Parker, MD      . pantoprazole (PROTONIX) injection 40 mg  40 mg Intravenous Q24H Ron Parker, MD   40 mg at 07/18/15 2315     Discharge Medications: Please see discharge summary for a list of discharge medications.  Relevant Imaging Results:  Relevant Lab Results:   Additional Information    [name], LCSW

## 2015-07-19 NOTE — Consult Note (Signed)
Consultation  Referring Provider: [facility] - Dr Tat Primary Care Physician:  [name], MD Primary Gastroenterologist:  Former - Arlyce Dice  Reason for Consultation:  Rectal bleeding  HPI: Rebecca [name] is a 70 y.o. female nursing home resident who was admitted last evening through the emergency room with 4-5 day history of hematochezia. Patient denies abdominal pain. 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 She has been on eliquis and aspirin. Patient did have outpatient colonoscopy done in September 2011 per Dr. Arlyce Dice which was a normal exam. Review of her record shows her hemoglobin was 12.6 on 06/25/2015, hemoglobin yesterday on arrival in the ER 11.5 and hemoglobin this morning down to 9.9. BUN slightly elevated at 28 on admission creatinine 0.82. Patient is able to speak a few words though with a lot of effort. She says she has no difficulty swallowing and "eat whatever I want". Unclear whether the PEG tube has been used for pills etc. Patient denies any abdominal pain or cramping no nausea or vomiting. She has no history of GI bleeding. She denies any rectal pain.    Past Medical History  Diagnosis Date  . Hypertension   . 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 Expressive aphasia 03/23/2013  . Atrial fibrillation (HCC) 05/01/2013  . Tobacco abuse 05/01/2013  . Diabetes mellitus without complication (HCC)   . Depression   . Stroke (HCC)     2015  . Stroke [facility]) 2016    aphasia    Past Surgical History  Procedure Laterality Date  . Tee without cardioversion N/A 03/26/2013    Procedure: TRANSESOPHAGEAL ECHOCARDIOGRAM (TEE);  Surgeon: Thurmon Fair, MD;  Location: [facility] ENDOSCOPY;  Service: Cardiovascular;  Laterality: N/A;  . Cesarean section      x3  . Hip replacement Right 1990's  . Hip replacemet Left 1990's  . Laparoscopic  gastrostomy N/A 04/20/2014    Procedure: LAPAROSCOPIC GASTROSTOMY TUBE PLACEMENT;  Surgeon: Axel Filler, MD;  Location: [facility] OR;  Service: General;  Laterality: N/A;  . Peg tube placemnt  04/15/14  . Tooth extraction Right 1/17    lower canine    Prior to Admission medications   Medication Sig Start Date End Date Taking? Authorizing Provider  apixaban (ELIQUIS) 5 MG TABS tablet Take 5 mg by mouth daily.   Yes Historical Provider, MD  aspirin EC 81 MG tablet Take 81 mg by mouth daily.   Yes Historical Provider, MD  atorvastatin (LIPITOR) 40 MG tablet Take 1 tablet (40 mg total) by mouth daily at 6 PM. 03/27/13  Yes Myra Rude, MD  cloNIDine (CATAPRES - DOSED IN MG/24 HR) 0.2 mg/24hr patch Place 1 patch (0.2 mg total) onto the skin once a week. 07/06/15  Yes Sharee Holster, NP  hydrocortisone (ANUSOL-HC) 25 MG suppository Place 25 mg rectally 2 (two) times daily. May also use q12hprn for hemorrhoids   Yes Historical Provider, MD  loratadine (CLARITIN) 10 MG tablet Take 10 mg by mouth daily with breakfast.   Yes Historical Provider, MD  metoprolol tartrate (LOPRESSOR) 25 MG tablet Take 0.5 tablets (12.5 mg total) by mouth 2 (two) times daily. 04/07/14  Yes Drema Dallas, MD  Multiple Vitamin (MULTIVITAMIN) LIQD Take 5 mLs by mouth daily with breakfast.   Yes Historical Provider, MD  polyethylene glycol (MIRALAX / GLYCOLAX) packet Take 17 g by mouth daily.   Yes Historical  Provider, MD    Current Facility-Administered Medications  Medication Dose Route Frequency Provider Last Rate Last Dose  . acetaminophen (TYLENOL) tablet 650 mg  650 mg Oral Q6H PRN Ron Parker, MD       Or  . acetaminophen (TYLENOL) suppository 650 mg  650 mg Rectal Q6H PRN Ron Parker, MD      . atorvastatin (LIPITOR) tablet 40 mg  40 mg Oral q1800 Ron Parker, MD      . Chlorhexidine Gluconate Cloth 2 % PADS 6 each  6 each Topical Q0600 Ron Parker, MD      . HYDROmorphone (DILAUDID)  injection 0.5-1 mg  0.5-1 mg Intravenous Q3H PRN Ron Parker, MD      . metoprolol tartrate (LOPRESSOR) tablet 12.5 mg  12.5 mg Oral BID Ron Parker, MD      . mupirocin ointment (BACTROBAN) 2 % 1 application  1 application Nasal BID Ron Parker, MD      . ondansetron (ZOFRAN) tablet 4 mg  4 mg Oral Q6H PRN Ron Parker, MD       Or  . ondansetron (ZOFRAN) injection 4 mg  4 mg Intravenous Q6H PRN Harvette Velora Heckler, MD      . oxyCODONE (Oxy IR/ROXICODONE) immediate release tablet 5 mg  5 mg Oral Q4H PRN Ron Parker, MD      . pantoprazole (PROTONIX) injection 40 mg  40 mg Intravenous Q24H Ron Parker, MD   40 mg at 07/18/15 2315    Allergies as of 07/18/2015 - Review Complete 07/18/2015  Allergen Reaction Noted  . Lisinopril Swelling 03/22/2015    Family History  Problem Relation Age of Onset  . Alcohol abuse Maternal Aunt     Social History   Social History  . Marital Status: Single    Spouse Name: N/A  . Number of Children: N/A  . Years of Education: N/A   Occupational History  . Not on file.   Social History Main Topics  . Smoking status: Never Smoker   . Smokeless tobacco: Not on file  . Alcohol Use: No  . Drug Use: No  . Sexual Activity: Not Currently   Other Topics Concern  . Not on file   Social History Narrative    Review of Systems: Pertinent positive and negative review of systems were noted in the above HPI section.  All other review of systems was otherwise negative.[name]  Physical Exam: Vital signs in last 24 hours: Temp:  [98 F (36.7 C)-98.3 F (36.8 C)] 98 F (36.7 C) (05/01 0600) Pulse Rate:  [53-61] 54 (05/01 0600) Resp:  [20-22] 20 (05/01 0600) BP: (130-144)/(72-91) 130/72 mmHg (05/01 0600) SpO2:  [97 %-100 %] 100 % (05/01 0600)   General:   Alert,  Well-developed, AA female with expressive aphasia- able to say a few words with effort, pleasant and cooperative in NAD Head:  Normocephalic and  atraumatic. Eyes:  Sclera clear, no icterus.   Conjunctiva pink. Ears:  Normal auditory acuity. Nose:  No deformity, discharge,  or lesions. Mouth:  No deformity or lesions.   Neck:  Supple; no masses or thyromegaly. Lungs:  Clear throughout to auscultation.   No wheezes, crackles, or rhonchi. Heart:  Regular rate and rhythm; no murmurs, clicks, rubs,  or gallops. Abdomen:  Soft,nontender, BS active,nonpalp mass or hsm  Peg tube in place   Rectal:  Done in er - no ext hems noted, nurses report dark red blood  x 2 this am Msk:  Symmetrical without gross deformities. . Pulses:  Normal pulses noted. Extremities:  Without clubbing or edema. Neurologic:  Alert , weakness on left side, expressive aphasia Skin:  Intact without significant lesions or rashes.. Psych:  Alert and cooperative. Normal mood and affect.  Intake/Output from previous day: 04/30 0701 - 05/01 0700 In: 456.3 [I.V.:456.3] Out: -  Intake/Output this shift:    Lab Results:  Recent Labs  07/18/15 2004 07/18/15 2309 07/19/15 0406  WBC 4.0  --  3.7*  HGB 11.5* 10.3* 9.9*  HCT 34.8* 30.8* 29.5*  PLT 147*  --  151   BMET  Recent Labs  07/18/15 2004 07/19/15 0406  NA 143 145  K 4.4 4.0  CL 111 112*  CO2 24 25  GLUCOSE 100* 103*  BUN 28* 26*  CREATININE 0.82 0.74  CALCIUM 9.3 8.9   LFT No results for input(s): PROT, ALBUMIN, AST, ALT, ALKPHOS, BILITOT, BILIDIR, IBILI in the last 72 hours. PT/INR  Recent Labs  07/18/15 2004  LABPROT 18.7*  INR 1.56*     IMPRESSION:   #73  70 year old African-American female admitted with acute lower GI bleed, etiology unclear, this is in the setting of aspirin and eliquis. Prior colonoscopy in 2011 normal. Rule out bleeding secondary to diverticulosis, occult lesion #2 normocytic anemia secondary to above #3 chronic anticoagulation with aspirin and eliquis #4 history of CVA with expressive aphasia #5 history of atrial fibrillation #6 congestive heart failure #7  adult-onset diabetes mellitus #8 status post PEG placement 2016-unclear if this is being used much at this point  Plan; l Will ask  Speech path to see her for swallowing eval to advise regarding diet If okayed from speech path standpoint can place her on a full liquid diet Serial hemoglobins and transfuse for hemoglobin less than 8 If acute significant bleeding over the next 24 hours proceed to bleeding scan She will need colonoscopy, will allow Eliquis  to clear out of her system over the next 24 hours and probably plan for colonoscopy on Wednesday, with prep tomorrow.      Amy Esterwood  07/19/2015, 10:01 AM    Attending physician's note   I have taken a history, examined the patient and reviewed the chart. I agree with the Advanced Practitioner's note, impression and recommendations. 70 year old female with history of CVA and expressive aphagia on aspirin and Eliquis admitted with hematochezia. Her last colonoscopy in 2011 was normal. No h/o diverticulosis based on last colonoscopy. Ischemic colitis seems less likely as well. If continues to have episodes of hematochezia, will consider colonoscopy for further evaluation, tentatively on Wednesday.    Iona Beard, MD [phone] Mon-Fri 8a-5p [phone] after 5p, weekends, holidays

## 2015-07-19 NOTE — Evaluation (Signed)
Clinical/Bedside Swallow Evaluation Patient Details  Name: ROZELLE CAUDLE MRN: [id number] Date of Birth: [birth year]  Today's Date: 07/19/2015 Time: SLP Start Time (ACUTE ONLY): 1329 SLP Stop Time (ACUTE ONLY): 1430 SLP Time Calculation (min) (ACUTE ONLY): 61 min  Past Medical History:  Past Medical History  Diagnosis Date  . Hypertension   . 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 Expressive aphasia 03/23/2013  . Atrial fibrillation (HCC) 05/01/2013  . Tobacco abuse 05/01/2013  . Diabetes mellitus without complication (HCC)   . Depression   . Stroke (HCC)     2015  . Stroke [facility]) 2016    aphasia   Past Surgical History:  Past Surgical History  Procedure Laterality Date  . Tee without cardioversion N/A 03/26/2013    Procedure: TRANSESOPHAGEAL ECHOCARDIOGRAM (TEE);  Surgeon: Thurmon Fair, MD;  Location: [facility] ENDOSCOPY;  Service: Cardiovascular;  Laterality: N/A;  . Cesarean section      x3  . Hip replacement Right 1990's  . Hip replacemet Left 1990's  . Laparoscopic gastrostomy N/A 04/20/2014    Procedure: LAPAROSCOPIC GASTROSTOMY TUBE PLACEMENT;  Surgeon: Axel Filler, MD;  Location: [facility] OR;  Service: General;  Laterality: N/A;  . Peg tube placemnt  04/15/14  . Tooth extraction Right 1/17    lower canine   HPI:  70 yo female adm to [facility] with hemotchezia- plan for possible EGD to determine source of GI bleed.    PMH + for dysphagia and aphasia after CVA in 2015, tobacco use, fabry disease, DM, HTN.  Pt resides at [facility].  Swallow eval ordered.  Per review of notes, pt has been on regular/thin diet and uses PEG for supplements and medications.     Assessment / Plan / Recommendation Clinical Impression  Pt presents with mild oral deficits due to her prior cva and motor planning deficits.  Communication is effortful for pt due to her expressie aphasia.  SLP observed pt consuming water and 4 ounces icecream.   Swallow was timely without oral residuals or indication of pharyngeal  residuals.   Pt uses PEG for supplements and medications per [facility] note located in hard chart.  Recommend continue diet - note plans for endoscopy when Eliquis leaves system.    Pt reports she does eat with dentures and SLP offered to obtain them for her and bring them to her tomorrow.  Called facility to make them aware and spoke to her nurse Elmarie Shiley, also called son [name] and left message on his phone re: SLP plan to obtain dentures/eye glasses.  thanks.     Aspiration Risk  Mild aspiration risk    Diet Recommendation Thin liquid (full liquids)   Liquid Administration via: Cup;Straw Medication Administration: Via alternative means Supervision: Patient able to self feed (needs set up, pt with right hemiparesis) Compensations: Slow rate;Small sips/bites Postural Changes: Seated upright at 90 degrees;Remain upright for at least 30 minutes after po intake    Other  Recommendations Oral Care Recommendations: Oral care BID   Follow up Recommendations   (tbd)    Frequency and Duration min 1 x/week  1 week       Prognosis Prognosis for Safe Diet Advancement: Good      Swallow Study   General Date of Onset: 07/19/15 HPI: 70 yo female adm to [facility] with hemotchezia- plan for possible EGD to determine source of GI bleed.    PMH + for dysphagia and aphasia after CVA in 2015, tobacco use, fabry disease, DM, HTN.  Pt resides at  [facility].  Swallow eval ordered.  Per review of notes, pt has been on regular/thin diet and uses PEG for supplements and medications.   Type of Study: Bedside Swallow Evaluation Diet Prior to this Study: NPO;Other (Comment) (full liquids) Temperature Spikes Noted: No Respiratory Status: Room air History of Recent Intubation: No Behavior/Cognition: Alert;Cooperative;Pleasant mood Oral Cavity Assessment: Dry Oral Care Completed by SLP: No Oral Cavity - Dentition: Edentulous;Dentures, not available;Missing dentition (dentures at facility, SLP to obtain and  bring to pt tomorrow) Vision: Functional for self-feeding Self-Feeding Abilities: Needs set up (pt with right HP) Patient Positioning: Upright in bed Baseline Vocal Quality: Low vocal intensity Volitional Cough:  (motor planning issues) Volitional Swallow: Unable to elicit    Oral/Motor/Sensory Function Overall Oral Motor/Sensory Function: Mild impairment Facial ROM: Reduced left Facial Symmetry: Abnormal symmetry right Facial Strength: Reduced right Lingual ROM: Reduced right Lingual Symmetry: Abnormal symmetry right Lingual Strength: Reduced Lingual Sensation: Reduced Velum: Within Functional Limits   Ice Chips Ice chips: Not tested   Thin Liquid Thin Liquid: Within functional limits Presentation: Self Fed;Straw;Cup    Nectar Thick Nectar Thick Liquid: Not tested   Honey Thick Honey Thick Liquid: Not tested   Puree Puree: Within functional limits Presentation: Self Fed;Spoon Other Comments: icecream   Solid   GO   Solid: Not tested Other Comments: due to pt being on full liquid diet        Mills Koller, MS [facility] SLP [phone]

## 2015-07-19 NOTE — Progress Notes (Addendum)
PROGRESS NOTE  Rebecca [name] AVW:[id number] DOB: Apr 24, 1945 DOA: 07/18/2015 PCP: [name], MD Outpatient Specialists:  Graciela Husbands, cardiology Sethi, neurology  Brief History:  70 year old female with a history of cognitive impairment secondary to multiple strokes, atrial fibrillation on apixaban, hypertension, hyperlipidemia, systolic and diastolic CHF presented with 4-5 episodes of hematochezia at [facility]. Due to her expressive aphasia and cognitive impairment, the patient is unable to provide any history. According to the patient's son, she had a colonoscopy approximately 3-4 years ago, but does not recall where. He stated that it was "normal". He does not recall the reasoning for the colonoscopy at that time. Upon presentation, the patient was noted to have a hemoglobin of 10.3. The patient was hemodynamically stable. Gastroenterology was consulted to assist with management.  Assessment/Plan: Hematochezia -The patient had another episode of bloody stool overnight -GI consult has been placed -hold apixaban   Chronic atrial fibrillation -Rate controlled -hold apixaban -continue metoprolol  Chronic systolic and diastolic CHF -Clinically compensated -Daily weights   Diabetes mellitus type 2 -05/05/2015 A1c 5.8 -Discontinue CBGs  Dysphagia -speech therapy eval -son states that Gtube has not been used in nearly one year  Hyperlipidemia -Continue statin  Hypertension -Monitor off clonidine patch  History of stroke -PT/OT -resume apixaban when cleared by GI     Disposition Plan:   SNF in 1-2days  Family Communication:   [name] updated on phone 5/1--total time 35 min; >50% spent counseling and coordinating care  Consultants:  [facility]  Code Status:  FULL    Subjective:  review of systems unobtainable secondary to cognitive impairment. There is no reports of vomiting, diarrhea, respiratory distress, uncontrolled pain. The patient had  one bloody stool overnight. The patient currently denies any chest pain, shortness breath, abdominal pain.  Objective: Filed Vitals:   07/18/15 1840 07/18/15 2213 07/18/15 2344 07/19/15 0600  BP: 138/91 144/91 137/75 130/72  Pulse: 56 61 53 54  Temp: 98 F (36.7 C)  98.3 F (36.8 C) 98 F (36.7 C)  TempSrc: Oral  Oral Oral  Resp: 22 20 20 20   SpO2: 99% 100% 100% 100%    Intake/Output Summary (Last 24 hours) at 07/19/15 0944 Last data filed at 07/19/15 0600  Gross per 24 hour  Intake 456.25 ml  Output      0 ml  Net 456.25 ml   Weight change:  Exam:   General:  Pt is alert, follows commands appropriately, not in acute distress  HEENT: No icterus, No thrush, No neck mass, [place]/AT  Cardiovascular: RRR, S1/S2, no rubs, no gallops  Respiratory: Diminish BS but clear to auscultation.   Abdomen: Soft/+BS, non tender, non distended, no guarding; gastrostomy tube site without erythema, induration, drainage   Extremities: trace LE edema, No lymphangitis, No petechiae, No rashes, no synovitis   Data Reviewed: I have personally reviewed following labs and imaging studies Basic Metabolic Panel:  Recent Labs Lab 07/18/15 2004 07/19/15 0406  NA 143 145  K 4.4 4.0  CL 111 112*  CO2 24 25  GLUCOSE 100* 103*  BUN 28* 26*  CREATININE 0.82 0.74  CALCIUM 9.3 8.9   Liver Function Tests: No results for input(s): AST, ALT, ALKPHOS, BILITOT, PROT, ALBUMIN in the last 168 hours. No results for input(s): LIPASE, AMYLASE in the last 168 hours. No results for input(s): AMMONIA in the last 168 hours. Coagulation Profile:  Recent Labs Lab 07/18/15 2004  INR 1.56*  CBC:  Recent Labs Lab 07/18/15 2004 07/18/15 2309 07/19/15 0406  WBC 4.0  --  3.7*  HGB 11.5* 10.3* 9.9*  HCT 34.8* 30.8* 29.5*  MCV 86.8  --  85.3  PLT 147*  --  151   Cardiac Enzymes: No results for input(s): CKTOTAL, CKMB, CKMBINDEX, TROPONINI in the last 168 hours. BNP: Invalid input(s):  POCBNP CBG: No results for input(s): GLUCAP in the last 168 hours. HbA1C: No results for input(s): HGBA1C in the last 72 hours. Urine analysis:    Component Value Date/Time   COLORURINE YELLOW 07/18/2015 2011   APPEARANCEUR CLEAR 07/18/2015 2011   LABSPEC 1.022 07/18/2015 2011   PHURINE 6.0 07/18/2015 2011   GLUCOSEU NEGATIVE 07/18/2015 2011   HGBUR NEGATIVE 07/18/2015 2011   BILIRUBINUR NEGATIVE 07/18/2015 2011   KETONESUR NEGATIVE 07/18/2015 2011   PROTEINUR NEGATIVE 07/18/2015 2011   UROBILINOGEN 1.0 04/12/2014 0102   NITRITE NEGATIVE 07/18/2015 2011   LEUKOCYTESUR SMALL* 07/18/2015 2011   Sepsis Labs: (procalcitonin:4,lacticidven:4) ) Recent Results (from the past 240 hour(s))  MRSA PCR Screening     Status: Abnormal   Collection Time: 07/18/15 11:52 PM  Result Value Ref Range Status   MRSA by PCR POSITIVE (A) NEGATIVE Final    Comment:        The GeneXpert MRSA Assay (FDA approved for NASAL specimens only), is one component of a comprehensive MRSA colonization surveillance program. It is not intended to diagnose MRSA infection nor to guide or monitor treatment for MRSA infections. RESULT CALLED TO, READ BACK BY AND VERIFIED WITH: Alvie Heidelberg RN @ [phone] ON 07/19/15 BY C DAVIS      Scheduled Meds: . atorvastatin  40 mg Oral q1800  . Chlorhexidine Gluconate Cloth  6 each Topical Q0600  . metoprolol tartrate  12.5 mg Oral BID  . mupirocin ointment  1 application Nasal BID  . pantoprazole (PROTONIX) IV  40 mg Intravenous Q24H   Continuous Infusions: . sodium chloride 75 mL/hr at 07/18/15 2355    Procedures/Studies: No results found.  Shamone Winzer, DO  [facility] Pager [phone]  If 7PM-7AM, please contact night-coverage [email or web address] Password [email or web address] 07/19/2015, 9:44 AM   LOS: 1 day

## 2015-07-19 NOTE — Clinical Social Work Note (Signed)
Clinical Social Work Assessment  Patient Details  Name: Rebecca [name] MRN: [id number] Date of Birth: [birth year]  Date of referral:  07/19/15               Reason for consult:  Facility Placement                Permission sought to share information with:  Family Supports Permission granted to share information::     Name::      Armed forces logistics/support/administrative officer   Agency::     Relationship::    Son  Contact Information:    Rebecca [name]  Housing/Transportation Living arrangements for the past 2 months:    Source of Information:  Adult Children [place] ) Patient Interpreter Needed:  None Criminal Activity/Legal Involvement Pertinent to Current Situation/Hospitalization:  No - Comment as needed Significant Relationships:  Adult Children Lives with:  Facility Resident Do you feel safe going back to the place where you live?  Yes Need for family participation in patient care:  Yes (Comment)  Care giving concerns:   Patient will return to [facility] where she has resided since 2016.    Social Worker assessment / plan: LCSW went to patient bedside. Patient was asleep. LCSW called patient son, Rebecca [name]. Rebecca [name]  explained that his mother has resided at [facility] since 2016 and feels his mother has done well at the facility. Rebecca [name] reported his mother is not on any particular diet with no limitations. She has difficulty expressing herself at times but has no problem with saying "hello"or having a conversation. She is receiving speech therapy. RebeccaRebecca [name] explained she is confined to a wheelchair and needs assistance with her needs.   Plan: Patient will return to SNF once medically stable. No barriers at this time. Facility is aware of patient being admitted and clinicals sent for review along with updated FL2.  Employment status:  Disabled (Comment on whether or not currently receiving Disability) Insurance information:  Medicare PT  Recommendations:  No Follow Up Information / Referral to community resources:  Other (Comment Required) (Patient will return to [facility] )  Patient/Family's Response to care: Agreeable   Patient/Family's Understanding of and Emotional Response to Diagnosis, Current Treatment, and Prognosis:  Patient son very knowledgeable of patient's current treatment at hospital and nursing home AEB able to report daily function level, needs in nursing such as speech therapy etc. IN agreement safest venue after DC will be nursing home.       Emotional Assessment Appearance:  Appears stated age Attitude/Demeanor/Rapport:  Other (Patient was sleeping ) Affect (typically observed):  Unable to Assess Orientation:  Oriented to Self, Oriented to Place Alcohol / Substance use:  Not Applicable Psych involvement (Current and /or in the community):  No (Comment)  Discharge Needs  Concerns to be addressed:  No discharge needs identified Readmission within the last 30 days:  No Current discharge risk:  Dependent with Mobility Barriers to Discharge:       [name], LCSW 07/19/2015, 2:15 PM

## 2015-07-20 LAB — BASIC METABOLIC PANEL
ANION GAP: 8 (ref 5–15)
BUN: 24 mg/dL — ABNORMAL HIGH (ref 6–20)
CO2: 24 mmol/L (ref 22–32)
Calcium: 9 mg/dL (ref 8.9–10.3)
Chloride: 113 mmol/L — ABNORMAL HIGH (ref 101–111)
Creatinine, Ser: 0.86 mg/dL (ref 0.44–1.00)
GFR calc non Af Amer: >60 mL/min (ref >60)
GLUCOSE: 102 mg/dL — AB (ref 65–99)
POTASSIUM: 4 mmol/L (ref 3.5–5.1)
Sodium: 145 mmol/L (ref 135–145)

## 2015-07-20 LAB — CBC
HEMATOCRIT: 28.1 % — AB (ref 36.0–46.0)
HEMOGLOBIN: 9.3 g/dL — AB (ref 12.0–15.0)
MCH: 28.3 pg (ref 26.0–34.0)
MCHC: 33.1 g/dL (ref 30.0–36.0)
MCV: 85.4 fL (ref 78.0–100.0)
Platelets: 147 10*3/uL — ABNORMAL LOW (ref 150–400)
RBC: 3.29 MIL/uL — AB (ref 3.87–5.11)
RDW: 14.6 % (ref 11.5–15.5)
WBC: 3.3 10*3/uL — AB (ref 4.0–10.5)

## 2015-07-20 LAB — HEMOGLOBIN AND HEMATOCRIT, BLOOD
HEMATOCRIT: 28 % — AB (ref 36.0–46.0)
HEMATOCRIT: 27.7 % — AB (ref 36.0–46.0)
HEMOGLOBIN: 9.2 g/dL — AB (ref 12.0–15.0)
Hemoglobin: 9.3 g/dL — ABNORMAL LOW (ref 12.0–15.0)

## 2015-07-20 MED ORDER — HYDROCORTISONE ACETATE 25 MG RE SUPP
25.0000 mg | Freq: Two times a day (BID) | RECTAL | Status: DC
  Administered 2015-07-20 – 2015-07-23 (×5): 25 mg via RECTAL
  Filled 2015-07-20 (×7): qty 1

## 2015-07-20 NOTE — Progress Notes (Signed)
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 Due to her expressive aphasia and cognitive impairment, the patient is unable to provide any history. According to the patient's son, she had a colonoscopy approximately 3-4 years ago, but does not recall where. He stated that it was "normal". He does not recall the reasoning for the colonoscopy at that time. Upon presentation, the patient was noted to have a hemoglobin of 10.3. The patient was hemodynamically stable. Gastroenterology was consulted to assist with management.  Assessment/Plan: Hematochezia -The patient had another 2 episodes of bloody stool overnight--small volume -Appreciate GI follow up -hold apixaban -Hgb largely stable -Anusol per GI  Chronic atrial fibrillation -Rate controlled -hold apixaban -hold metoprolol for bradycardia  Chronic systolic and diastolic CHF -Clinically compensated -Daily weights--stable  Diabetes mellitus type 2 -05/05/2015 A1c 5.8 -Discontinue CBGs  Dysphagia -speech therapy eval-->dysphagia 3 diet with thin when diet is advanced -son states that Gtube has not been used in nearly one year  Hyperlipidemia -Continue statin  Hypertension -Monitor off clonidine patch  History of stroke -PT/OT -resume apixaban when cleared by GI     Disposition Plan: SNF in 1-2days  Family Communication: Son [name] updated on phone 5/1   Consultants: [facility]  Code Status: FULL   Subjective: No reports of vomiting, diarrhea, abdominal pain, respiratory distress. Patient denies any shortness breath  or abdominal pain.  Objective: Filed Vitals:   07/20/15 0459 07/20/15 1003 07/20/15 1400 07/20/15 1800  BP: 130/87 114/81 132/90 130/84  Pulse: 56 53 52 62  Temp: 98.3 F (36.8 C)  97.7 F (36.5 C) 98 F (36.7 C)  TempSrc: Oral  Oral Oral  Resp: Height:      Weight:      SpO2: 96% 98% 100% 100%    Intake/Output Summary (Last 24 hours) at 07/20/15 2026 Last data filed at 07/20/15 1400  Gross per 24 hour  Intake    360 ml  Output      0 ml  Net    360 ml   Weight change:  Exam:   General:  Pt is alert, follows commands appropriately, not in acute distress  HEENT: No icterus, No thrush, No neck mass, [place]/AT  Cardiovascular: RRR, S1/S2, no rubs, no gallops  Respiratory: Diminished breath sounds at the bases but clear to auscultation.  Abdomen: Soft/+BS, non tender, non distended, no guarding;gastrostomy tube site without erythema, induration, drainage   Extremities: trace LE edema, No lymphangitis, No petechiae, No rashes, no synovitis   Data Reviewed: I have personally reviewed following labs and imaging studies Basic Metabolic Panel:  Recent Labs Lab 07/18/15 2004 07/19/15 0406 07/20/15 0433  NA 143 145 145  K 4.4 4.0 4.0  CL 111 112* 113*  CO2 GLUCOSE 100* 103* 102*  BUN 28* 26* 24*  CREATININE 0.82 0.74 0.86  CALCIUM 9.3 8.9 9.0   Liver Function Tests: No results for input(s): AST, ALT, ALKPHOS, BILITOT, PROT, ALBUMIN in the last 168 hours. No results for input(s): LIPASE, AMYLASE in the last 168 hours. No results for input(s): AMMONIA in the last 168 hours. Coagulation Profile:  Recent  Labs Lab 07/18/15 2004  INR 1.56*   CBC:  Recent Labs Lab 07/18/15 2004  07/19/15 0406 07/19/15 1459 07/19/15 2158 07/20/15 0433 07/20/15 1435  WBC 4.0  --  3.7*  --   --  3.3*  --   HGB 11.5*  < > 9.9* 9.7* 9.5* 9.3*  9.3* 9.2*  HCT 34.8*  < > 29.5* 29.5* 28.4* 28.1*  28.0* 27.7*  MCV 86.8  --  85.3  --   --  85.4  --   PLT  147*  --  151  --   --  147*  --   < > = values in this interval not displayed. Cardiac Enzymes: No results for input(s): CKTOTAL, CKMB, CKMBINDEX, TROPONINI in the last 168 hours. BNP: Invalid input(s): POCBNP CBG: No results for input(s): GLUCAP in the last 168 hours. HbA1C: No results for input(s): HGBA1C in the last 72 hours. Urine analysis:    Component Value Date/Time   COLORURINE YELLOW 07/18/2015 2011   APPEARANCEUR CLEAR 07/18/2015 2011   LABSPEC 1.022 07/18/2015 2011   PHURINE 6.0 07/18/2015 2011   GLUCOSEU NEGATIVE 07/18/2015 2011   HGBUR NEGATIVE 07/18/2015 2011   BILIRUBINUR NEGATIVE 07/18/2015 2011   KETONESUR NEGATIVE 07/18/2015 2011   PROTEINUR NEGATIVE 07/18/2015 2011   UROBILINOGEN 1.0 04/12/2014 0102   NITRITE NEGATIVE 07/18/2015 2011   LEUKOCYTESUR SMALL* 07/18/2015 2011   Sepsis Labs: @LABRCNTIP (procalcitonin:4,lacticidven:4) ) Recent Results (from the past 240 hour(s))  MRSA PCR Screening     Status: Abnormal   Collection Time: 07/18/15 11:52 PM  Result Value Ref Range Status   MRSA by PCR POSITIVE (A) NEGATIVE Final    Comment:        The GeneXpert MRSA Assay (FDA approved for NASAL specimens only), is one component of a comprehensive MRSA colonization surveillance program. It is not intended to diagnose MRSA infection nor to guide or monitor treatment for MRSA infections. RESULT CALLED TO, READ BACK BY AND VERIFIED WITH: Alvie Heidelberg RN @ [phone] ON 07/19/15 BY C DAVIS      Scheduled Meds: . atorvastatin  40 mg Oral q1800  . Chlorhexidine Gluconate Cloth  6 each Topical Q0600  . metoprolol tartrate  12.5 mg Oral BID  . mupirocin ointment  1 application Nasal BID  . pantoprazole (PROTONIX) IV  40 mg Intravenous Q24H   Continuous Infusions:   Procedures/Studies: No results found.  Kareema Keitt, DO  [facility] Pager [phone]  If 7PM-7AM, please contact night-coverage [email or web address] Password [email or web address] 07/20/2015, 8:26 PM   LOS: 2  days

## 2015-07-20 NOTE — Progress Notes (Signed)
     [facility] Progress Note  Subjective:  Had a bloody stool at 10 pm last night and 12 am last night, but nothing since then.  Hgb is stable this AM at 9.3 grams.  Objective:  Vital signs in last 24 hours: Temp:  [98.2 F (36.8 C)-98.4 F (36.9 C)] 98.3 F (36.8 C) (05/02 0459) Pulse Rate:  [50-56] 56 (05/02 0459) Resp:  [18] 18 (05/02 0459) BP: (118-144)/(73-87) 130/87 mmHg (05/02 0459) SpO2:  [96 %-99 %] 96 % (05/02 0459) Weight:  [141 lb 4 oz (64.071 kg)] 141 lb 4 oz (64.071 kg) (05/01 1200)   General:  Alert, Well-developed, in NAD Heart:  Slightly bradycardic.  Pulm:  CTAB.  No W/R/R. Abdomen:  Soft, non-distended.  BS present.  Non-tender.  PEG tube noted. Extremities:  Without edema. Neurologic:  Expressive aphasia.  Intake/Output from previous day: 05/01 0701 - 05/02 0700 In: 450 [I.V.:450] Out: -   Lab Results:  Recent Labs  07/18/15 2004  07/19/15 0406 07/19/15 1459 07/19/15 2158 07/20/15 0433  WBC 4.0  --  3.7*  --   --  3.3*  HGB 11.5*  < > 9.9* 9.7* 9.5* 9.3*  9.3*  HCT 34.8*  < > 29.5* 29.5* 28.4* 28.1*  28.0*  PLT 147*  --  151  --   --  147*  < > = values in this interval not displayed. BMET  Recent Labs  07/18/15 2004 07/19/15 0406 07/20/15 0433  NA 143 145 145  K 4.4 4.0 4.0  CL 111 112* 113*  CO2 24 25 24   GLUCOSE 100* 103* 102*  BUN 28* 26* 24*  CREATININE 0.82 0.74 0.86  CALCIUM 9.3 8.9 9.0   PT/INR  Recent Labs  07/18/15 2004  LABPROT 18.7*  INR 1.56*   Assessment / Plan: #60  70 year old African-American female admitted with acute lower GI bleed, etiology unclear, this is in the setting of aspirin and eliquis. Prior colonoscopy in 2011 normal. Rule out bleeding secondary to diverticulosis, occult lesion. #2 normocytic anemia due to acute blood loss, secondary to above.  Hgb is stable at 9.3 grams this AM. #3 chronic anticoagulation with aspirin and eliquis #4 history of CVA with expressive aphasia #5  history of atrial fibrillation #6 congestive heart failure #7 adult-onset diabetes mellitus #8 status post PEG placement 2016-unclear if this is being used much at this point (apparently for meds)  *Continue to monitor Hgb and transfuse for Hgb < 8 grams. *Going to avoid colonoscopy if possible as long as bleeding continues to resolve and Hgb remains stable.  Will determine when to restart Eliquis prior to discharge. *Will advance to soft diet.   LOS: 2 days   ZEHR, JESSICA D.  07/20/2015, 9:17 AM  Pager number [phone]    Attending physician's note   I have taken an interval history, reviewed the chart and examined the patient. I agree with the Advanced Practitioner's note, impression and recommendations. Patient appears to have brown stool with small volume blood along with it suggestive of possible hemorrhoidal bleed or distal rectal ulcer. Start anusol BID and if no improvement in next 1-2 days, will consider flexible sigmoidoscopy for evaluation and management  K Scherry Ran, MD [phone] Mon-Fri 8a-5p [phone] after 5p, weekends, holidays

## 2015-07-20 NOTE — Progress Notes (Signed)
Speech Language Pathology Treatment: Dysphagia  Patient Details Name: Rebecca [name] MRN: [id number] DOB: 07-06-1945 Today's Date: 07/20/2015 Time: 4098-1191 SLP Time Calculation (min) (ACUTE ONLY): 50 min  Assessment / Plan / Recommendation Clinical Impression  SLP went to pt's SNF and obtained her eyeglasses - provided to pt today.  RN at facility reports pt does not have dentures as she recently had teeth removed.  Pt appeared frustrated and minimally participative today stating "I wish I was home."  Pt states she does have dentures but her aphasia prevents her from describing where they are located.  Ordered pt a house breakfast, lunch and dinner tray and requested breakfast to be sent soon.  Pt willing to consume small amount of Ensure stating "you want to make me fat". No s/s of airway compromise with intake, minimally delayed swallow noted.     SLP explained limited diet at this time and her need for nutrition.  Pt did not allow SLP to help with oral care - dental brushing= despite education to importance.      When pt to get diet advanced, due to few lower dentition only, recommend dys3/ground meat/thin.  SLP to sign off at this time as suspect pt's swallow is at baseline ability.  Recommend follow up for communication at SNF.  Thanks    HPI HPI: 70 yo female adm to [facility] with hemotchezia- plan for possible EGD to determine source of GI bleed.    PMH + for dysphagia and aphasia after CVA in 2015, tobacco use, fabry disease, DM, HTN.  Pt resides at [facility].  Swallow eval ordered.  Per review of notes, pt has been on regular/thin diet and uses PEG for supplements and medications.        SLP Plan  All goals met     Recommendations  Diet recommendations: Dysphagia 3 (mechanical soft);Thin liquid (when ready to advance, given lack of dentition, recommend advance to dys3) Liquids provided via: Cup;Straw Medication Administration: Via alternative means Supervision: Patient able to  self feed (set up needed) Compensations: Slow rate;Small sips/bites;Lingual sweep for clearance of pocketing Postural Changes and/or Swallow Maneuvers: Seated upright 90 degrees;Upright 30-60 min after meal             Follow up Recommendations: Skilled Nursing facility (for language deficits) Plan: All goals met     [place], [facility] SLP [phone]

## 2015-07-21 DIAGNOSIS — I482 Chronic atrial fibrillation: Secondary | ICD-10-CM

## 2015-07-21 DIAGNOSIS — E119 Type 2 diabetes mellitus without complications: Secondary | ICD-10-CM

## 2015-07-21 DIAGNOSIS — I119 Hypertensive heart disease without heart failure: Secondary | ICD-10-CM

## 2015-07-21 LAB — CBC
HEMATOCRIT: 28 % — AB (ref 36.0–46.0)
HEMOGLOBIN: 9.4 g/dL — AB (ref 12.0–15.0)
MCH: 28.7 pg (ref 26.0–34.0)
MCHC: 33.6 g/dL (ref 30.0–36.0)
MCV: 85.6 fL (ref 78.0–100.0)
Platelets: 149 10*3/uL — ABNORMAL LOW (ref 150–400)
RBC: 3.27 MIL/uL — ABNORMAL LOW (ref 3.87–5.11)
RDW: 14.4 % (ref 11.5–15.5)
WBC: 3.3 10*3/uL — ABNORMAL LOW (ref 4.0–10.5)

## 2015-07-21 MED ORDER — ENSURE ENLIVE PO LIQD
237.0000 mL | Freq: Two times a day (BID) | ORAL | Status: DC
  Administered 2015-07-22 – 2015-07-23 (×2): 237 mL via ORAL

## 2015-07-21 NOTE — Progress Notes (Signed)
     [facility] Progress Note  Subjective:  No BM or sign of bleeding overnight or this AM.  Hgb is stable.  Objective:  Vital signs in last 24 hours: Temp:  [97.7 F (36.5 C)-98.5 F (36.9 C)] 98 F (36.7 C) (05/03 0603) Pulse Rate:  [49-62] 50 (05/03 0603) Resp:  [16-19] 16 (05/03 0603) BP: (114-138)/(81-90) 125/89 mmHg (05/03 0603) SpO2:  [95 %-100 %] 95 % (05/03 0603)   General:  Alert, Well-developed, in NAD Heart:  Bradycardic. Pulm:  CTAB.  No W/R/R. Abdomen:  Soft, non-distended.  BS present.  Non-tender.  PEG tube in place.  Extremities:  Without edema. Neurologic:  Expressive aphasia.  Intake/Output from previous day: 05/02 0701 - 05/03 0700 In: 480 [P.O.:480] Out: -   Lab Results:  Recent Labs  07/19/15 0406  07/20/15 0433 07/20/15 1435 07/21/15 0450  WBC 3.7*  --  3.3*  --  3.3*  HGB 9.9*  < > 9.3*  9.3* 9.2* 9.4*  HCT 29.5*  < > 28.1*  28.0* 27.7* 28.0*  PLT 151  --  147*  --  149*  < > = values in this interval not displayed. BMET  Recent Labs  07/18/15 2004 07/19/15 0406 07/20/15 0433  NA 143 145 145  K 4.4 4.0 4.0  CL 111 112* 113*  CO2 24 25 24   GLUCOSE 100* 103* 102*  BUN 28* 26* 24*  CREATININE 0.82 0.74 0.86  CALCIUM 9.3 8.9 9.0   PT/INR  Recent Labs  07/18/15 2004  LABPROT 18.7*  INR 1.56*   Assessment / Plan: #71 70 year old African-American female admitted with acute lower GI bleed, etiology unclear, this is in the setting of aspirin and eliquis. Prior colonoscopy in 2011 normal. Rule out bleeding secondary to diverticulosis, occult lesion.  Sounds most likely hemorrhoidal at this point.  No BM or further bleeding currently. #2 normocytic anemia due to acute blood loss, secondary to above. Hgb is stable at 9.4 grams this AM. #3 chronic anticoagulation with aspirin and eliquis #4 history of CVA with expressive aphasia #5 history of atrial fibrillation #6 congestive heart failure #7 adult-onset diabetes  mellitus #8 status post PEG placement 2016-unclear if this is being used much at this point (apparently for meds)  *Continue to monitor Hgb and transfuse for Hgb < 8 grams. *Going to avoid colonoscopy if possible as long as bleeding continues to resolve and Hgb remains stable. Will determine when to restart Eliquis prior to discharge (Dr. Lavon Paganini please address). *Continue to treat with anusol suppositories BID for hemorrhoidal source of bleeding.   LOS: 3 days   ZEHR, JESSICA D.  07/21/2015, 9:14 AM  Pager number [phone]    Attending physician's note   I have taken an interval history, reviewed the chart and examined the patient. I agree with the Advanced Practitioner's note, impression and recommendations. Hgb has remained stable during this admission. Patient appears to be having small volume BRBPR likely from hemorrhoids. Ok to restart Eliquis tonight and monitor for a day prior to discharge. Continue to monitor daily Hgb. Continue Anusol twice daily x 7-10 days. Will sign off but available for any questions. Please call with any change in clinical status  K Scherry Ran, MD [phone] Mon-Fri 8a-5p [phone] after 5p, weekends, holidays

## 2015-07-21 NOTE — Progress Notes (Signed)
Initial Nutrition Assessment  DOCUMENTATION CODES:   Not applicable  INTERVENTION:  -Ensure Enlive po BID, each supplement provides 350 kcal and 20 grams of protein -NDD3 - thin liquids per SLP  NUTRITION DIAGNOSIS:   Predicted suboptimal nutrient intake related to chronic illness as evidenced by estimated needs.  GOAL:   Patient will meet greater than or equal to 90% of their needs  MONITOR:   PO intake, Supplement acceptance, Labs, I & O's, Skin  REASON FOR ASSESSMENT:   Low [place]    ASSESSMENT:   Rebecca [name] is a 70 y.o. female with a history of CVA with residual Expressive Aphasia, Atrial Fibrillation on Eliquis Rx, HTN, DM2, and Hyperlipidemia who was sent from the [facility] SNF to the ED due to Hematochezia x 4-5 days. Her initial hemoglobin was found to be 11. 5. She denies any ABD pain.   Rebecca [name] is a pleasantly confused 71 yo female with a hx of CVA and Expressive Aphasia. She was able to say "NO" to a few questions but otherwise responded "I don't know." She did have a tray in front of her during my visit that she had consumed approximately 50% of at this point in time. PO Intake seems ok but she still has a PEG though MD note states it hasn't been used in approximately 1 year.  Her weight is stable x2 months. Admitted for rectal bleed and hematochezia. HGB has stabilized.  Nutrition-Focused physical exam completed. Findings are no fat depletion, no muscle depletion, and no edema.   Labs and Medications reviewed.   Diet Order:  DIET DYS 3 Room service appropriate?: Yes; Fluid consistency:: Thin  Skin:  Reviewed, no issues  Last BM:  PTA  Height:   Ht Readings from Last 1 Encounters:  07/19/15 5\' 4"  (1.626 m)    Weight:   Wt Readings from Last 1 Encounters:  07/19/15 141 lb 4 oz (64.071 kg)    Ideal Body Weight:  54.54 kg  BMI:  Body mass index is 24.23 kg/(m^2).  Estimated Nutritional Needs:   Kcal:   1600-1900  Protein:  65-75 grams  Fluid:  >/= 1.6L  EDUCATION NEEDS:   No education needs identified at this time  Rebecca [name]. Rebecca Trombetta, MS, RD LDN After Hours/Weekend Pager [phone]

## 2015-07-21 NOTE — Care Management Important Message (Addendum)
Important Message  Patient Details IM Letter given to Suzanne/Case Manager to present to Patient Name: Rebecca [name] MRN: [id number] Date of Birth: 10/14/1945   Medicare Important Message Given:  Yes    Haskell Flirt 07/21/2015, 9:20 AMImportant Message  Patient Details  Name: Rebecca [name] MRN: [id number] Date of Birth: 03-Jun-1945   Medicare Important Message Given:  Yes    Haskell Flirt 07/21/2015, 9:20 AM

## 2015-07-21 NOTE — Progress Notes (Signed)
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 Due to her expressive aphasia and cognitive impairment, the patient is unable to provide any history. According to the patient's son, she had a colonoscopy approximately 3-4 years ago, but does not recall where. He stated that it was "normal". He does not recall the reasoning for the colonoscopy at that time. Upon presentation, the patient was noted to have a hemoglobin of 10.3. The patient was hemodynamically stable. Gastroenterology was consulted to assist with management.  Assessment/Plan: Hematochezia -last bloody stool was the night before last- no stool since -Appreciate GI follow up- cont Anusol- -hold apixaban -Hgb stable  Chronic atrial fibrillation -sinus bradycardia- metoprolol has been on hold -hold apixaban  Chronic systolic and diastolic CHF -Clinically compensated -Daily weights--stable  Diabetes mellitus type 2 -05/05/2015 A1c 5.8 -Discontinue CBGs  Dysphagia -speech therapy eval-->dysphagia 3 diet with thin when diet is advanced -son states that Gtube has not been used in nearly one year  Hyperlipidemia -Continue statin  Hypertension -Monitor off clonidine patch  History of stroke - PEG tube     Disposition Plan: SNF in 1-2days  Family Communication: Son [name] updated on phone 5/1   Consultants: [facility]  Code Status: FULL   Subjective:  no complaints of stool, pain, vomiting.   Objective: Filed Vitals:   07/20/15 2159 07/21/15 0100 07/21/15 0603 07/21/15 1000  BP: 138/83 133/81 125/89 122/78    Pulse: 61 49 50 62  Temp: 98 F (36.7 C) 98.5 F (36.9 C) 98 F (36.7 C) 98.7 F (37.1 C)  TempSrc: Oral Oral Oral Oral  Resp:  Height:      Weight:      SpO2: 100% 100% 95% 100%    Intake/Output Summary (Last 24 hours) at 07/21/15 1333 Last data filed at 07/21/15 1000  Gross per 24 hour  Intake    600 ml  Output      0 ml  Net    600 ml   Weight change:  Exam:   General:  Pt is alert, follows commands appropriately, not in acute distress  HEENT: No icterus, No thrush, No neck mass, [place]/AT  Cardiovascular: RRR, S1/S2, no rubs, no gallops  Respiratory: Diminished breath sounds at the bases but clear to auscultation.  Abdomen: Soft/+BS, non tender, non distended, no guarding;gastrostomy tube site without erythema, induration, drainage   Extremities: trace LE edema, No lymphangitis, No petechiae, No rashes, no synovitis   Data Reviewed: I have personally reviewed following labs and imaging studies Basic Metabolic Panel:  Recent Labs Lab 07/18/15 2004 07/19/15 0406 07/20/15 0433  NA 143 145 145  K 4.4 4.0 4.0  CL 111 112* 113*  CO2 GLUCOSE 100* 103* 102*  BUN 28* 26* 24*  CREATININE 0.82 0.74 0.86  CALCIUM 9.3 8.9 9.0   Liver Function Tests: No results for input(s): AST, ALT, ALKPHOS, BILITOT, PROT, ALBUMIN in the last 168 hours. No results for input(s): LIPASE, AMYLASE in the last 168 hours. No results for input(s): AMMONIA in the last 168 hours. Coagulation Profile:  Recent Labs Lab 07/18/15 2004  INR 1.56*   CBC:  Recent Labs Lab 07/18/15 2004  07/19/15 0406 07/19/15 1459 07/19/15 2158 07/20/15 0433 07/20/15 1435 07/21/15 0450  WBC 4.0  --  3.7*  --   --  3.3*  --  3.3*  HGB 11.5*  < > 9.9* 9.7* 9.5* 9.3*  9.3* 9.2* 9.4*  HCT 34.8*  < > 29.5* 29.5* 28.4* 28.1*  28.0* 27.7* 28.0*  MCV 86.8  --  85.3  --   --  85.4  --  85.6  PLT 147*  --  151  --   --  147*  --  149*  < > = values in this interval not  displayed. Cardiac Enzymes: No results for input(s): CKTOTAL, CKMB, CKMBINDEX, TROPONINI in the last 168 hours. BNP: Invalid input(s): POCBNP CBG: No results for input(s): GLUCAP in the last 168 hours. HbA1C: No results for input(s): HGBA1C in the last 72 hours. Urine analysis:    Component Value Date/Time   COLORURINE YELLOW 07/18/2015 2011   APPEARANCEUR CLEAR 07/18/2015 2011   LABSPEC 1.022 07/18/2015 2011   PHURINE 6.0 07/18/2015 2011   GLUCOSEU NEGATIVE 07/18/2015 2011   HGBUR NEGATIVE 07/18/2015 2011   BILIRUBINUR NEGATIVE 07/18/2015 2011   KETONESUR NEGATIVE 07/18/2015 2011   PROTEINUR NEGATIVE 07/18/2015 2011   UROBILINOGEN 1.0 04/12/2014 0102   NITRITE NEGATIVE 07/18/2015 2011   LEUKOCYTESUR SMALL* 07/18/2015 2011   Sepsis Labs: @LABRCNTIP (procalcitonin:4,lacticidven:4) ) Recent Results (from the past 240 hour(s))  MRSA PCR Screening     Status: Abnormal   Collection Time: 07/18/15 11:52 PM  Result Value Ref Range Status   MRSA by PCR POSITIVE (A) NEGATIVE Final    Comment:        The GeneXpert MRSA Assay (FDA approved for NASAL specimens only), is one component of a comprehensive MRSA colonization surveillance program. It is not intended to diagnose MRSA infection nor to guide or monitor treatment for MRSA infections. RESULT CALLED TO, READ BACK BY AND VERIFIED WITH: Alvie Heidelberg RN @ [phone] ON 07/19/15 BY C DAVIS      Scheduled Meds: . atorvastatin  40 mg Oral q1800  . Chlorhexidine Gluconate Cloth  6 each Topical Q0600  . feeding supplement (ENSURE ENLIVE)  237 mL Oral BID BM  . hydrocortisone  25 mg Rectal BID  . mupirocin ointment  1 application Nasal BID  . pantoprazole (PROTONIX) IV  40 mg Intravenous Q24H   Continuous Infusions:   Procedures/Studies: No results found.  Calvert Cantor, MD [facility] Pager: [email or web address] Password [email or web address] 07/21/2015, 1:33 PM   LOS: 3 days

## 2015-07-21 NOTE — Evaluation (Signed)
Physical Therapy Evaluation Patient Details Name: Rebecca [name] MRN: [id number] DOB: 08/14/1945 Today's Date: 07/21/2015   History of Present Illness  Pt is a 70 year old female admitted for Hematochezia with PMHx of  HTN, expressive aphasia (04/2013), A-fib, DM, stroke, PEG placement and bil hip replacements   Clinical Impression  Pt admitted with above diagnosis. Pt currently with functional limitations due to the deficits listed below (see PT Problem List).  Pt will benefit from skilled PT to increase their independence and safety with mobility to allow discharge to the venue listed below.  Pt assisted to sitting EOB.  Pt reports she uses w/c although expressive aphasia limiting ability to describe her transfers.  Pt is a resident at SNF so will defer f/u rehab needs to SNF.     Follow Up Recommendations SNF    Equipment Recommendations  None recommended by PT    Recommendations for Other Services       Precautions / Restrictions Precautions Precautions: Fall Precaution Comments: expressive aphasia      Mobility  Bed Mobility Overal bed mobility: Needs Assistance Bed Mobility: Supine to Sit;Sit to Supine     Supine to sit: Mod assist Sit to supine: Min assist   General bed mobility comments: verbal cues for technique, assist for scooting to EOB, improved return to bed with slight assist for positioning of LEs  Transfers                    Ambulation/Gait                Stairs            Wheelchair Mobility    Modified Rankin (Stroke Patients Only)       Balance Overall balance assessment: Needs assistance Sitting-balance support: Bilateral upper extremity supported;Feet supported Sitting balance-Leahy Scale: Fair                                       Pertinent Vitals/Pain Pain Assessment: No/denies pain    Home Living Family/patient expects to be discharged to:: Skilled nursing facility                     Prior Function Level of Independence: Needs assistance   Gait / Transfers Assistance Needed: pt reports using w/c, unable to explain transfers due to aphasia           Hand Dominance        Extremity/Trunk Assessment               Lower Extremity Assessment: Generalized weakness         Communication   Communication: Expressive difficulties  Cognition Arousal/Alertness: Awake/alert Behavior During Therapy: WFL for tasks assessed/performed Overall Cognitive Status: Difficult to assess                      General Comments      Exercises        Assessment/Plan    PT Assessment Patient needs continued PT services  PT Diagnosis Generalized weakness   PT Problem List Decreased strength;Decreased mobility;Decreased activity tolerance  PT Treatment Interventions DME instruction;Balance training;Functional mobility training;Patient/family education;Therapeutic activities;Wheelchair mobility training;Therapeutic exercise   PT Goals (Current goals can be found in the Care Plan section) Acute Rehab PT Goals PT Goal Formulation: With patient Time For Goal Achievement: 07/28/15 Potential to Achieve Goals:  Good    Frequency Min 3X/week   Barriers to discharge        Co-evaluation               End of Session   Activity Tolerance: Patient tolerated treatment well Patient left: in bed;with call bell/phone within reach;with bed alarm set           Time: 1011-1029 PT Time Calculation (min) (ACUTE ONLY): 18 min   Charges:   PT Evaluation $PT Eval Low Complexity: 1 Procedure     PT G Codes:        [place] Pam,KATHrine E 07/21/2015, 12:08 PM Zenovia Jarred, PT, DPT 07/21/2015 Pager: [phone]

## 2015-07-22 LAB — CBC
HCT: 29.8 % — ABNORMAL LOW (ref 36.0–46.0)
Hemoglobin: 9.9 g/dL — ABNORMAL LOW (ref 12.0–15.0)
MCH: 28.4 pg (ref 26.0–34.0)
MCHC: 33.2 g/dL (ref 30.0–36.0)
MCV: 85.4 fL (ref 78.0–100.0)
PLATELETS: 155 10*3/uL (ref 150–400)
RBC: 3.49 MIL/uL — AB (ref 3.87–5.11)
RDW: 14.3 % (ref 11.5–15.5)
WBC: 4 10*3/uL (ref 4.0–10.5)

## 2015-07-22 MED ORDER — APIXABAN 5 MG PO TABS
5.0000 mg | ORAL_TABLET | Freq: Two times a day (BID) | ORAL | Status: DC
  Administered 2015-07-22 – 2015-07-23 (×3): 5 mg via ORAL
  Filled 2015-07-22 (×4): qty 1

## 2015-07-22 MED ORDER — APIXABAN 5 MG PO TABS: 5.0000 mg | ORAL_TABLET | Freq: Every day | ORAL | Status: DC

## 2015-07-22 MED ORDER — METOPROLOL TARTRATE 12.5 MG HALF TABLET
12.5000 mg | ORAL_TABLET | Freq: Two times a day (BID) | ORAL | Status: DC
  Administered 2015-07-23: 12.5 mg via ORAL
  Filled 2015-07-22 (×3): qty 1

## 2015-07-22 NOTE — Progress Notes (Signed)
PROGRESS NOTE  Rebecca [name] RCV:[id number] DOB: [birth year] DOA: 07/18/2015 PCP: [name], MD Outpatient Specialists: Graciela Husbands, cardiology Sethi, neurology  Brief History:  70 year old female with a history of cognitive impairment secondary to multiple strokes, atrial fibrillation on apixaban, hypertension, hyperlipidemia, systolic and diastolic CHF presented with 4-5 episodes of hematochezia at [facility]. Due to her expressive aphasia and cognitive impairment, the patient is unable to provide any history. According to the patient's son, she had a colonoscopy approximately 3-4 years ago, but does not recall where. He stated that it was "normal". He does not recall the reasoning for the colonoscopy at that time. Upon presentation, the patient was noted to have a hemoglobin of 10.3. The patient was hemodynamically stable. Gastroenterology was consulted to assist with management.  Assessment/Plan: Hematochezia -last bloody stool was 2 days ago- no stool since -Appreciate GI follow up- cont Anusol- -ok to resume apixaban today- GI recommends to follow for 24 hrs after resuming Apixaban -Hgb stable  Chronic atrial fibrillation -sinus bradycardia- metoprolol and clonidine has been on hold- HR rising- resume Lopressor 12.5 BID -hold apixaban  Chronic systolic and diastolic CHF -Clinically compensated -Daily weights--stable  Diabetes mellitus type 2 -05/05/2015 A1c 5.8 -Discontinue CBGs  Dysphagia -speech therapy eval-->dysphagia 3 diet with thin liquids -son states that Gtube has not been used in nearly one year  Hyperlipidemia -Continue statin  Hypertension -Monitor off clonidine patch  History of stroke - PEG tube     Disposition Plan: SNF in 1-2days  Family Communication: Son [name] updated on phone 5/1   Consultants: [facility]  Code Status: FULL   Subjective:  she has no complaints of stool, pain, vomiting.    Objective: Filed Vitals:   07/21/15 2216 07/22/15 0059 07/22/15 0613 07/22/15 0921  BP: 130/76 125/70 133/85 140/85  Pulse: 57 60 107 88  Temp: 98.1 F (36.7 C) 98 F (36.7 C) 98.4 F (36.9 C) 98.6 F (37 C)  TempSrc: Oral Oral Oral Oral  Resp: 16 15 16 16   Height:      Weight:      SpO2: 100% 99% 100% 100%    Intake/Output Summary (Last 24 hours) at 07/22/15 1323 Last data filed at 07/22/15 1100  Gross per 24 hour  Intake    720 ml  Output      0 ml  Net    720 ml   Weight change:  Exam:   General:  Pt is alert, follows commands appropriately, not in acute distress  HEENT: No icterus, No thrush, No neck mass, [place]/AT  Cardiovascular: RRR, S1/S2, no rubs, no gallops  Respiratory: Diminished breath sounds at the bases but clear to auscultation.  Abdomen: Soft/+BS, non tender, non distended, no guarding;gastrostomy tube site without erythema, induration, drainage   Extremities: trace LE edema, No lymphangitis, No petechiae, No rashes, no synovitis   Data Reviewed: I have personally reviewed following labs and imaging studies Basic Metabolic Panel:  Recent Labs Lab 07/18/15 2004 07/19/15 0406 07/20/15 0433  NA 143 145 145  K 4.4 4.0 4.0  CL 111 112* 113*  CO2 24 25 24   GLUCOSE 100* 103* 102*  BUN 28* 26* 24*  CREATININE 0.82 0.74 0.86  CALCIUM 9.3 8.9 9.0   Liver Function Tests: No results for input(s): AST, ALT, ALKPHOS, BILITOT, PROT, ALBUMIN in the last 168 hours. No results for input(s): LIPASE, AMYLASE in the last 168 hours. No results for input(s): AMMONIA in  the last 168 hours. Coagulation Profile:  Recent Labs Lab 07/18/15 2004  INR 1.56*   CBC:  Recent Labs Lab 07/18/15 2004  07/19/15 0406  07/19/15 2158 07/20/15 0433 07/20/15 1435 07/21/15 0450 07/22/15 0850  WBC 4.0  --  3.7*  --   --  3.3*  --  3.3* 4.0  HGB 11.5*  < > 9.9*  < > 9.5* 9.3*  9.3* 9.2* 9.4* 9.9*  HCT 34.8*  < > 29.5*  < > 28.4* 28.1*  28.0* 27.7* 28.0*  29.8*  MCV 86.8  --  85.3  --   --  85.4  --  85.6 85.4  PLT 147*  --  151  --   --  147*  --  149* 155  < > = values in this interval not displayed. Cardiac Enzymes: No results for input(s): CKTOTAL, CKMB, CKMBINDEX, TROPONINI in the last 168 hours. BNP: Invalid input(s): POCBNP CBG: No results for input(s): GLUCAP in the last 168 hours. HbA1C: No results for input(s): HGBA1C in the last 72 hours. Urine analysis:    Component Value Date/Time   COLORURINE YELLOW 07/18/2015 2011   APPEARANCEUR CLEAR 07/18/2015 2011   LABSPEC 1.022 07/18/2015 2011   PHURINE 6.0 07/18/2015 2011   GLUCOSEU NEGATIVE 07/18/2015 2011   HGBUR NEGATIVE 07/18/2015 2011   BILIRUBINUR NEGATIVE 07/18/2015 2011   KETONESUR NEGATIVE 07/18/2015 2011   PROTEINUR NEGATIVE 07/18/2015 2011   UROBILINOGEN 1.0 04/12/2014 0102   NITRITE NEGATIVE 07/18/2015 2011   LEUKOCYTESUR SMALL* 07/18/2015 2011   Sepsis Labs: (procalcitonin:4,lacticidven:4) ) Recent Results (from the past 240 hour(s))  MRSA PCR Screening     Status: Abnormal   Collection Time: 07/18/15 11:52 PM  Result Value Ref Range Status   MRSA by PCR POSITIVE (A) NEGATIVE Final    Comment:        The GeneXpert MRSA Assay (FDA approved for NASAL specimens only), is one component of a comprehensive MRSA colonization surveillance program. It is not intended to diagnose MRSA infection nor to guide or monitor treatment for MRSA infections. RESULT CALLED TO, READ BACK BY AND VERIFIED WITH: Alvie Heidelberg RN @ [phone] ON 07/19/15 BY C DAVIS      Scheduled Meds: . apixaban  5 mg Oral BID  . atorvastatin  40 mg Oral q1800  . Chlorhexidine Gluconate Cloth  6 each Topical Q0600  . feeding supplement (ENSURE ENLIVE)  237 mL Oral BID BM  . hydrocortisone  25 mg Rectal BID  . mupirocin ointment  1 application Nasal BID  . pantoprazole (PROTONIX) IV  40 mg Intravenous Q24H   Continuous Infusions:   Procedures/Studies: No results  found.  Calvert Cantor, MD [facility] Pager: [email or web address] Password [email or web address] 07/22/2015, 1:23 PM   LOS: 4 days

## 2015-07-23 ENCOUNTER — Encounter (HOSPITAL_COMMUNITY): Admission: RE | Admit: 2015-07-23 | Payer: No Typology Code available for payment source | Source: Ambulatory Visit

## 2015-07-23 DIAGNOSIS — K921 Melena: Secondary | ICD-10-CM

## 2015-07-23 DIAGNOSIS — I6990 Unspecified sequelae of unspecified cerebrovascular disease: Secondary | ICD-10-CM

## 2015-07-23 DIAGNOSIS — D638 Anemia in other chronic diseases classified elsewhere: Secondary | ICD-10-CM

## 2015-07-23 MED ORDER — ENSURE ENLIVE PO LIQD: 237.0000 mL | Freq: Two times a day (BID) | ORAL | Status: DC

## 2015-07-23 MED ORDER — HYDROCORTISONE ACETATE 25 MG RE SUPP: 25.0000 mg | Freq: Two times a day (BID) | RECTAL | Status: DC

## 2015-07-23 NOTE — Progress Notes (Signed)
Report called and given to nurse at [facility] [place] RN 11:05 AM  07-23-2015

## 2015-07-23 NOTE — Progress Notes (Addendum)
LCSW made aware that patient is medically stable for discharge today. Facility contacted, made aware aware of discharge and agreeable. All clinicals sent via Epic to facility. Patient agreeable to plan. Call placed to patient son: Rebecca [name] regarding discharge, message was left. Patient will transport by EMS.  LCSW completed medical necessity form.  No other needs at this time.  Return to SNF:   [facility]  Deretha Emory, MSW Clinical Social Work: [employer] [phone]

## 2015-07-23 NOTE — Discharge Summary (Signed)
Physician Discharge Summary  Rebecca [name] WJX:[id number] DOB: Nov 03, 1945 DOA: 07/18/2015  PCP: [name], MD  Admit date: 07/18/2015 Discharge date: 07/23/2015  Time spent: 55 minutes  Recommendations for Outpatient Follow-up:  1. Anusol for 7 more day- Follow for bleeding- if bleed recurs, try to resolve bleeding with resuming Anusol  2. Remove PEG?   Discharge Condition:stable    Discharge Diagnoses:  Principal Problem:   Hematochezia Active Problems:   Anemia, chronic disease   Hyperlipidemia   Benign hypertensive heart disease without heart failure   Late effects of CVA (cerebrovascular accident)     History of present illness:  70 year old female with a history of cognitive impairment secondary to multiple strokes, atrial fibrillation on apixaban, hypertension, hyperlipidemia, systolic and diastolic CHF presented with 4-5 episodes of hematochezia at [name]. Due to her expressive aphasia and cognitive impairment, the patient is unable to provide any history. According to the patient's son, she had a colonoscopy approximately 3-4 years ago, but does not recall where. He stated that it was "normal". He does not recall the reasoning for the colonoscopy at that time. Upon presentation, the patient was noted to have a hemoglobin of 10.3. The patient was hemodynamically stable.   Hospital Course:  Hematochezia -last bloody stool was 3 days ago- 1 stool last night was brown - hemoglobin has been stable -Appreciate GI follow up- recommended to treat for hemorrhoids with  Anusol which should be continued for another week- -ASA and apixaban held on admission- Apixaban resumed yesterday per GI recommendations- as mentioned no recurrence of bleed- last neuro note on 1/16 stated she should be on both ASA 81 mg and Apixaban - will resume ASA today  AOCD - no drop in Hb- ranging from 9-10  P Atrial fibrillation- bradycardic - HTN -sinus bradycardia in hospital-  metoprolol and clonidine has been on hold- HR rising- resumed Lopressor 12.5 BID on 5/4- HR now in high 50s and 60s- do not resume Clonidine - cont apixaban  Chronic systolic and diastolic CHF - EF 45-50% with G 2 DD- ECHO 1/16 -Clinically compensated- Metoprolol- not on ACE- angioedema from lisinopril  History of stroke with dysphagia and aphasia -Apixaban, ASA- -speech therapy eval-->dysphagia 3 diet with thin liquids -son states that Gtube has not been used in nearly one year- remove PEG tube?  Hyperlipidemia -Continue statin   Procedures:  none  Consultations:  GI  Discharge Exam: Filed Weights   07/19/15 1200  Weight: 64.071 kg (141 lb 4 oz)   Filed Vitals:   07/23/15 0245 07/23/15 0644  BP: 136/76 137/82  Pulse: 58 59  Temp: 98.6 F (37 C) 98.9 F (37.2 C)  Resp: 16 16    General: Alert, no distress Cardiovascular: RRR, no murmurs  Respiratory: clear to auscultation bilaterally GI: soft, non-tender, non-distended, bowel sound positive  Discharge Instructions You were cared for by a hospitalist during your hospital stay. If you have any questions about your discharge medications or the care you received while you were in the hospital after you are discharged, you can call the unit and asked to speak with the hospitalist on call if the hospitalist that took care of you is not available. Once you are discharged, your primary care physician will handle any further medical issues. Please note that NO REFILLS for any discharge medications will be authorized once you are discharged, as it is imperative that you return to your primary care physician (or establish a relationship with a primary care physician  if you do not have one) for your aftercare needs so that they can reassess your need for medications and monitor your lab values.     Medication List    STOP taking these medications        cloNIDine 0.2 mg/24hr patch  Commonly known as:  CATAPRES - Dosed in  mg/24 hr      TAKE these medications        atorvastatin 40 MG tablet  Commonly known as:  LIPITOR  Take 1 tablet (40 mg total) by mouth daily at 6 PM.     ELIQUIS 5 MG Tabs tablet  Generic drug:  apixaban  Take 5 mg by mouth 2 (two) times daily.     feeding supplement (ENSURE ENLIVE) Liqd  Take 237 mLs by mouth 2 (two) times daily between meals.     hydrocortisone 25 MG suppository  Commonly known as:  ANUSOL-HC  Place 1 suppository (25 mg total) rectally 2 (two) times daily.     loratadine 10 MG tablet  Commonly known as:  CLARITIN  Take 10 mg by mouth daily with breakfast.     metoprolol tartrate 25 MG tablet  Commonly known as:  LOPRESSOR  Take 0.5 tablets (12.5 mg total) by mouth 2 (two) times daily.     multivitamin Liqd  Take 5 mLs by mouth daily with breakfast.     polyethylene glycol packet  Commonly known as:  MIRALAX / GLYCOLAX  Take 17 g by mouth daily.      ASK your doctor about these medications        aspirin EC 81 MG tablet  Take 81 mg by mouth daily.       Allergies  Allergen Reactions  . Lisinopril Swelling      The results of significant diagnostics from this hospitalization (including imaging, microbiology, ancillary and laboratory) are listed below for reference.    Significant Diagnostic Studies: No results found.  Microbiology: Recent Results (from the past 240 hour(s))  MRSA PCR Screening     Status: Abnormal   Collection Time: 07/18/15 11:52 PM  Result Value Ref Range Status   MRSA by PCR POSITIVE (A) NEGATIVE Final    Comment:        The GeneXpert MRSA Assay (FDA approved for NASAL specimens only), is one component of a comprehensive MRSA colonization surveillance program. It is not intended to diagnose MRSA infection nor to guide or monitor treatment for MRSA infections. RESULT CALLED TO, READ BACK BY AND VERIFIED WITH: Alvie Heidelberg RN @ [phone] ON 07/19/15 BY C DAVIS      Labs: Basic Metabolic Panel:  Recent Labs Lab  07/18/15 2004 07/19/15 0406 07/20/15 0433  NA 143 145 145  K 4.4 4.0 4.0  CL 111 112* 113*  CO2 24 25 24   GLUCOSE 100* 103* 102*  BUN 28* 26* 24*  CREATININE 0.82 0.74 0.86  CALCIUM 9.3 8.9 9.0   Liver Function Tests: No results for input(s): AST, ALT, ALKPHOS, BILITOT, PROT, ALBUMIN in the last 168 hours. No results for input(s): LIPASE, AMYLASE in the last 168 hours. No results for input(s): AMMONIA in the last 168 hours. CBC:  Recent Labs Lab 07/18/15 2004  07/19/15 0406  07/19/15 2158 07/20/15 0433 07/20/15 1435 07/21/15 0450 07/22/15 0850  WBC 4.0  --  3.7*  --   --  3.3*  --  3.3* 4.0  HGB 11.5*  < > 9.9*  < > 9.5* 9.3*  9.3* 9.2* 9.4* 9.9*  HCT 34.8*  < > 29.5*  < > 28.4* 28.1*  28.0* 27.7* 28.0* 29.8*  MCV 86.8  --  85.3  --   --  85.4  --  85.6 85.4  PLT 147*  --  151  --   --  147*  --  149* 155  < > = values in this interval not displayed. Cardiac Enzymes: No results for input(s): CKTOTAL, CKMB, CKMBINDEX, TROPONINI in the last 168 hours. BNP: BNP (last 3 results) No results for input(s): BNP in the last 8760 hours.  ProBNP (last 3 results) No results for input(s): PROBNP in the last 8760 hours.  CBG: No results for input(s): GLUCAP in the last 168 hours.     SignedCalvert Cantor, MD [facility] 07/23/2015, 8:48 AM

## 2015-07-26 ENCOUNTER — Encounter: Payer: Self-pay | Admitting: Internal Medicine

## 2015-07-26 ENCOUNTER — Non-Acute Institutional Stay (SKILLED_NURSING_FACILITY): Payer: Medicare Other | Admitting: Internal Medicine

## 2015-07-26 DIAGNOSIS — I5042 Chronic combined systolic (congestive) and diastolic (congestive) heart failure: Secondary | ICD-10-CM | POA: Diagnosis not present

## 2015-07-26 DIAGNOSIS — I119 Hypertensive heart disease without heart failure: Secondary | ICD-10-CM

## 2015-07-26 DIAGNOSIS — E785 Hyperlipidemia, unspecified: Secondary | ICD-10-CM | POA: Diagnosis not present

## 2015-07-26 DIAGNOSIS — I69390 Apraxia following cerebral infarction: Secondary | ICD-10-CM

## 2015-07-26 DIAGNOSIS — D638 Anemia in other chronic diseases classified elsewhere: Secondary | ICD-10-CM

## 2015-07-26 DIAGNOSIS — R482 Apraxia: Secondary | ICD-10-CM | POA: Diagnosis not present

## 2015-07-26 DIAGNOSIS — E1149 Type 2 diabetes mellitus with other diabetic neurological complication: Secondary | ICD-10-CM

## 2015-07-26 DIAGNOSIS — I480 Paroxysmal atrial fibrillation: Secondary | ICD-10-CM | POA: Diagnosis not present

## 2015-07-26 DIAGNOSIS — K922 Gastrointestinal hemorrhage, unspecified: Secondary | ICD-10-CM | POA: Diagnosis not present

## 2015-07-26 NOTE — Assessment & Plan Note (Signed)
EF 45-50% with G 2 DD- ECHO 1/16 -Clinically compensated- Metoprolol- not on ACE- angioedema from lisinopril SNF - cont metoprolol, not on ACE 2/2 angioedema 2/2 ACE

## 2015-07-26 NOTE — Assessment & Plan Note (Signed)
last bloody stool was 3 days ago- 1 stool last night was brown - hemoglobin has been stable -Appreciate GI follow up- recommended to treat for hemorrhoids with Anusol which should be continued for another week- -ASA and apixaban held on admission- Apixaban resumed yesterday per GI recommendations- as mentioned no recurrence of bleed- last neuro note on 1/16 stated she should be on both ASA 81 mg and Apixaban - will resume ASA today SNF - cont eliquis and ASA

## 2015-07-26 NOTE — Assessment & Plan Note (Signed)
SNF - d/c Hb 9.9; will f/u BMP

## 2015-07-26 NOTE — Assessment & Plan Note (Signed)
sinus bradycardia in hospital- metoprolol and clonidine has been on hold- HR rising- resumed Lopressor 12.5 BID on 5/4- HR now in high 50s and 60s- do not resume Clonidine - cont apixaban SNF - cont metoprolol 12. 5 mg BID and eliquis and ASA 8 1mg 

## 2015-07-26 NOTE — Assessment & Plan Note (Signed)
SNF - controlled on metoprolol; [lan cont metoprolol12.5 mg BID

## 2015-07-26 NOTE — Assessment & Plan Note (Signed)
SNF - controlled ;cont lipitor 40 mg daily 

## 2015-07-26 NOTE — Assessment & Plan Note (Signed)
Apixaban, ASA- -speech therapy eval-->dysphagia 3 diet with thin liquids -son states that Gtube has not been used in nearly one year- remove PEG tube? SNF - cont eliquis and ASA; need consider removing feeding tibe

## 2015-07-26 NOTE — Assessment & Plan Note (Signed)
SNF - controlled with diet on no meds; A1c 5.8; pt on statin

## 2015-07-26 NOTE — Progress Notes (Signed)
MRN: [id number] Name: Rebecca [name]  Sex: female Age: 70 y.o. DOB: 09/20/1945  PSC #: Ronni Rumble Facility/Room:111 Level Of Care: SNF Provider: Merrilee Seashore D Emergency Contacts: Extended Emergency Contact Information Primary Emergency Contact: Gordy Savers [place] Home Phone: [phone] Relation: Son  Code Status:   Allergies: Lisinopril  Chief Complaint  Patient presents with  . Readmit To SNF    HPI: Patient is 70 y.o. female with history of cognitive impairment secondary to multiple strokes, atrial fibrillation on apixaban, hypertension, hyperlipidemia, systolic and diastolic CHF presented with 4-5 episodes of hematochezia at [facility].  Upon presentation, the patient was noted to have a hemoglobin of 10.3. The patient was hemodynamically stable. Pt was admitted to [facility] form 4/30- 5/5 where GI was consulted and felt no further w/u needed except anusol for 7 more days.Pt is admitted to SNF for residential care. While at SNF pt will be followed for AF, tx with metoprolol  and eliquis, CAD, h/o stroke , tx with eliquis and ASA, and HLD, tx with lipitor.   Past Medical History  Diagnosis Date  . Hypertension   . 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 Expressive aphasia 03/23/2013  . Atrial fibrillation (HCC) 05/01/2013  . Tobacco abuse 05/01/2013  . Diabetes mellitus without complication (HCC)   . Depression   . Stroke (HCC)     2015  . Stroke [facility]) 2016    aphasia    Past Surgical History  Procedure Laterality Date  . Tee without cardioversion N/A 03/26/2013    Procedure: TRANSESOPHAGEAL ECHOCARDIOGRAM (TEE);  Surgeon: Thurmon Fair, MD;  Location: [facility] ENDOSCOPY;  Service: Cardiovascular;  Laterality: N/A;  . Cesarean section      x3  . Hip replacement Right 1990's  . Hip replacemet Left 1990's  . Laparoscopic gastrostomy N/A 04/20/2014    Procedure: LAPAROSCOPIC GASTROSTOMY TUBE PLACEMENT;  Surgeon: Axel Filler, MD;  Location: [facility] OR;   Service: General;  Laterality: N/A;  . Peg tube placemnt  04/15/14  . Tooth extraction Right 1/17    lower canine      Medication List       This list is accurate as of: 07/26/15  7:38 PM.  Always use your most recent med list.               aspirin EC 81 MG tablet  Take 81 mg by mouth daily.     atorvastatin 40 MG tablet  Commonly known as:  LIPITOR  Take 1 tablet (40 mg total) by mouth daily at 6 PM.     ELIQUIS 5 MG Tabs tablet  Generic drug:  apixaban  Take 5 mg by mouth 2 (two) times daily.     feeding supplement (ENSURE ENLIVE) Liqd  Take 237 mLs by mouth 2 (two) times daily between meals.     hydrocortisone 25 MG suppository  Commonly known as:  ANUSOL-HC  Place 1 suppository (25 mg total) rectally 2 (two) times daily.     loratadine 10 MG tablet  Commonly known as:  CLARITIN  Take 10 mg by mouth daily with breakfast.     metoprolol tartrate 25 MG tablet  Commonly known as:  LOPRESSOR  Take 0.5 tablets (12.5 mg total) by mouth 2 (two) times daily.     multivitamin Liqd  Take 5 mLs by mouth daily with breakfast.     polyethylene glycol packet  Commonly known as:  MIRALAX / GLYCOLAX  Take 17 g by mouth daily.  No orders of the defined types were placed in this encounter.     There is no immunization history on file for this patient.  Social History  Substance Use Topics  . Smoking status: Never Smoker   . Smokeless tobacco: Not on file  . Alcohol Use: No    Family history is + ETOH abuse    Review of Systems  UTO 2/2 dementia; nursing without concerns     Filed Vitals:   07/26/15 1353  BP: 129/56  Pulse: 56  Temp: 98.4 F (36.9 C)  Resp: 17    SpO2 Readings from Last 1 Encounters:  07/26/15 94%        Physical Exam  GENERAL APPEARANCE: Alert,min conversant, BF No acute distress.  SKIN: No diaphoresis rash HEAD: Normocephalic, atraumatic  EYES: Conjunctiva/lids clear. Pupils round, reactive. EOMs intact.  EARS:  External exam WNL, canals clear. Hearing grossly normal.  NOSE: No deformity or discharge.  MOUTH/THROAT: Lips w/o lesions  RESPIRATORY: Breathing is even, unlabored. Lung sounds are clear   CARDIOVASCULAR: Heart RRR no murmurs, rubs or gallops. No peripheral edema.   GASTROINTESTINAL: Abdomen is soft, non-tender, not distended w/ normal bowel sounds. GENITOURINARY: Bladder non tender, not distended  MUSCULOSKELETAL: No abnormal joints or musculature NEUROLOGIC:  Cranial nerves 2-12 grossly intact. Moves all extremities  PSYCHIATRIC: Mood and affect appropriate to situation with dementia, no behavioral issues  Patient Active Problem List   Diagnosis Date Noted  . Systolic and diastolic CHF, chronic (HCC) 07/26/2015  . Anemia, chronic disease 07/23/2015  . Hematochezia 07/23/2015  . Lower GI bleed 07/18/2015  . Late effects of CVA (cerebrovascular accident) 07/06/2015  . UTI (urinary tract infection) 04/18/2015  . E. coli UTI 04/05/2015  . Angioedema of lips from Lisinopril 03/22/2015  . 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 Dysphagia S/P CVA (cerebrovascular accident) 04/22/2014  . 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 Global aphasia   . Paroxysmal atrial fibrillation (HCC)   . Hyperlipidemia   . Acute on chronic combined systolic and diastolic CHF (congestive heart failure) (HCC)   . Pulmonary hypertension (HCC)     CBC    Component Value Date/Time   WBC 4.0 07/22/2015 0850   WBC 4.7 03/30/2015   RBC 3.49* 07/22/2015 0850   HGB 9.9* 07/22/2015 0850   HCT 29.8* 07/22/2015 0850   PLT 155 07/22/2015 0850   MCV 85.4 07/22/2015 0850   LYMPHSABS 1.3 04/08/2014 0730   MONOABS 0.6 04/08/2014 0730   EOSABS 0.0 04/08/2014 0730   BASOSABS 0.0 04/08/2014 0730    CMP     Component Value Date/Time   NA 145 07/20/2015 0433   NA 142 03/30/2015    K 4.0 07/20/2015 0433   CL 113* 07/20/2015 0433   CO2 24 07/20/2015 0433   GLUCOSE 102* 07/20/2015 0433   BUN 24* 07/20/2015 0433   BUN 41* 03/30/2015   CREATININE 0.86 07/20/2015 0433   CREATININE 1.2* 03/30/2015   CALCIUM 9.0 07/20/2015 0433   PROT 8.3* 06/25/2015 1105   ALBUMIN 3.7 06/25/2015 1105   AST 31 06/25/2015 1105   ALT 18 06/25/2015 1105   ALKPHOS 31* 06/25/2015 1105   BILITOT 0.6 06/25/2015 1105   GFRNONAA >60 07/20/2015 0433   GFRAA >60 07/20/2015 0433    Lab Results  Component Value Date   HGBA1C 5.8 05/05/2015     No results found.  Not all  labs, radiology exams or other studies done during hospitalization come through on my EPIC note; however they are reviewed by me.    Assessment and Plan  Lower GI bleed last bloody stool was 3 days ago- 1 stool last night was brown - hemoglobin has been stable -Appreciate GI follow up- recommended to treat for hemorrhoids with Anusol which should be continued for another week- -ASA and apixaban held on admission- Apixaban resumed yesterday per GI recommendations- as mentioned no recurrence of bleed- last neuro note on 1/16 stated she should be on both ASA 81 mg and Apixaban - will resume ASA today SNF - cont eliquis and ASA  Paroxysmal atrial fibrillation (HCC) sinus bradycardia in hospital- metoprolol and clonidine has been on hold- HR rising- resumed Lopressor 12.5 BID on 5/4- HR now in high 50s and 60s- do not resume Clonidine - cont apixaban SNF - cont metoprolol 12. 5 mg BID and eliquis and ASA 8 1mg   Benign hypertensive heart disease without heart failure SNF - controlled on metoprolol; [lan cont metoprolol12.5 mg BID  Type II diabetes mellitus with neurological manifestations (HCC) SNF - controlled with diet on no meds; A1c 5.8; pt on statin  Anemia, chronic disease SNF - d/c Hb 9.9; will f/u BMP  Systolic and diastolic CHF, chronic (HCC) EF 45-50% with G 2 DD- ECHO 1/16 -Clinically compensated-  Metoprolol- not on ACE- angioedema from lisinopril SNF - cont metoprolol, not on ACE 2/2 angioedema 2/2 ACE  Apraxia following CVA (cerebrovascular accident) Apixaban, ASA- -speech therapy eval-->dysphagia 3 diet with thin liquids -son states that Gtube has not been used in nearly one year- remove PEG tube? SNF - cont eliquis and ASA; need consider removing feeding tibe  Hyperlipidemia SNF - controlled;cont lipitor 40 mg daily   Time spent > 45 min;> 50% of time with patient was spent reviewing records, labs, tests and studies, counseling and developing plan of care  [name], MD

## 2015-08-02 ENCOUNTER — Encounter: Payer: Self-pay | Admitting: Internal Medicine

## 2015-08-05 ENCOUNTER — Encounter: Payer: Self-pay | Admitting: Internal Medicine

## 2015-08-05 ENCOUNTER — Ambulatory Visit (INDEPENDENT_AMBULATORY_CARE_PROVIDER_SITE_OTHER): Payer: Medicare Other | Admitting: Internal Medicine

## 2015-08-05 VITALS — BP 140/90 | HR 64 | Ht 64.0 in

## 2015-08-05 DIAGNOSIS — I480 Paroxysmal atrial fibrillation: Secondary | ICD-10-CM | POA: Diagnosis not present

## 2015-08-05 DIAGNOSIS — E7521 Fabry (-Anderson) disease: Secondary | ICD-10-CM

## 2015-08-05 DIAGNOSIS — I639 Cerebral infarction, unspecified: Secondary | ICD-10-CM | POA: Diagnosis not present

## 2015-08-05 NOTE — Patient Instructions (Signed)
Medication Instructions: - Stop Aspirin  Labwork: - none  Procedures/Testing: - none  Follow-Up: - Your physician wants you to follow-up in: 1 year with Dr. Graciela Husbands. You will receive a reminder letter in the mail two months in advance. If you don't receive a letter, please call our office to schedule the follow-up appointment.  Any Additional Special Instructions Will Be Listed Below (If Applicable).     If you need a refill on your cardiac medications before your next appointment, please call your pharmacy.

## 2015-08-05 NOTE — Progress Notes (Signed)
Patient Care Team: Rebecca Hanks, MD as PCP - General (Internal Medicine) Rebecca Holster, NP as Nurse Practitioner (Geriatric Medicine) [facility] (Skilled Nursing Facility)   HPI  Rebecca [name] is a 70 y.o. 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 An event recorder so that we demonstrated atrial fibrillation. She is currently on Rivaroxaban   She's had a vast improvement in her aphasic effects; she is also ambulating quite well  Past Medical History  Diagnosis Date  . Hypertension   . 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 Expressive aphasia 03/23/2013  . Atrial fibrillation (HCC) 05/01/2013  . Tobacco abuse 05/01/2013  . Diabetes mellitus without complication (HCC)   . Depression   . Stroke (HCC)     2015  . Stroke [facility]) 2016    aphasia    Past Surgical History  Procedure Laterality Date  . Tee without cardioversion N/A 03/26/2013    Procedure: TRANSESOPHAGEAL ECHOCARDIOGRAM (TEE);  Surgeon: Rebecca Fair, MD;  Location: [facility] ENDOSCOPY;  Service: Cardiovascular;  Laterality: N/A;  . Cesarean section      x3  . Hip replacement Right 1990's  . Hip replacemet Left 1990's  . Laparoscopic gastrostomy N/A 04/20/2014    Procedure: LAPAROSCOPIC GASTROSTOMY TUBE PLACEMENT;  Surgeon: Rebecca Filler, MD;  Location: [facility] OR;  Service: General;  Laterality: N/A;  . Peg tube placemnt  04/15/14  . Tooth extraction Right 1/17    lower canine    Current Outpatient Prescriptions  Medication Sig Dispense Refill  . apixaban (ELIQUIS) 5 MG TABS tablet Take 5 mg by mouth 2 (two) times daily.     [name] aspirin EC 81 MG tablet Take 81 mg by mouth daily.    [name] atorvastatin (LIPITOR) 40 MG tablet Take 1 tablet (40 mg total) by mouth daily at 6 PM. 30 tablet 0  . cloNIDine (CATAPRES - DOSED IN MG/24 HR) 0.2 mg/24hr patch Place 0.2 mg onto the skin once a week.    . feeding supplement, ENSURE ENLIVE, (ENSURE ENLIVE) LIQD Take 237 mLs by  mouth 2 (two) times daily between meals. 237 mL 12  . hydrocortisone (ANUSOL-HC) 25 MG suppository Place 1 suppository (25 mg total) rectally 2 (two) times daily. 12 suppository 0  . loratadine (CLARITIN) 10 MG tablet Take 10 mg by mouth daily with breakfast.    . metoprolol tartrate (LOPRESSOR) 25 MG tablet Take 0.5 tablets (12.5 mg total) by mouth 2 (two) times daily. 60 tablet 0  . Multiple Vitamin (MULTIVITAMIN) LIQD Take 5 mLs by mouth daily with breakfast.    . polyethylene glycol (MIRALAX / GLYCOLAX) packet Take 17 g by mouth daily.     No current facility-administered medications for this visit.    Allergies  Allergen Reactions  . Lisinopril Swelling    Review of Systems negative except from HPI and PMH  Physical Exam BP 140/90 mmHg  Pulse 64  Ht  (1.626 m)  Wt  Well developed and well nourished in no acute distress HENT normal E scleral and icterus clear Neck Supple JVP flat; carotids brisk and full Clear to ausculation  Regular rate and rhythm, no murmurs gallops or rub Soft with active bowel sounds No clubbing cyanosis none Edema Alert and oriented, grossly normal motor and sensory function, walking with cane  Speech mostly aphasic Skin Warm and Dry  ECG NSR 73 [birth year]44 Axis 129 Assessment and  Plan   Rebecca [name]  fib paroxysmal  Abnormal ECG  Stroke   Atrial fibrillation-paroxysmal  We will continue her on her anticoagulation.  Stop asa  ECG done sitting in wheel chair-- may explain some fo the difference

## 2015-08-06 ENCOUNTER — Encounter (HOSPITAL_COMMUNITY): Payer: Self-pay

## 2015-08-06 ENCOUNTER — Other Ambulatory Visit (HOSPITAL_COMMUNITY): Payer: Self-pay | Admitting: Nephrology

## 2015-08-06 ENCOUNTER — Encounter (HOSPITAL_COMMUNITY)
Admission: RE | Admit: 2015-08-06 | Discharge: 2015-08-06 | Disposition: A | Discharge status: Home / Self Care | Payer: No Typology Code available for payment source | Source: Ambulatory Visit | Attending: Nephrology | Admitting: Nephrology

## 2015-08-06 DIAGNOSIS — F17200 Nicotine dependence, unspecified, uncomplicated: Secondary | ICD-10-CM | POA: Diagnosis not present

## 2015-08-06 DIAGNOSIS — I10 Essential (primary) hypertension: Secondary | ICD-10-CM | POA: Diagnosis not present

## 2015-08-06 DIAGNOSIS — R4701 Aphasia: Secondary | ICD-10-CM | POA: Diagnosis not present

## 2015-08-06 DIAGNOSIS — I4891 Unspecified atrial fibrillation: Secondary | ICD-10-CM | POA: Insufficient documentation

## 2015-08-06 DIAGNOSIS — E779 Disorder of glycoprotein metabolism, unspecified: Secondary | ICD-10-CM | POA: Diagnosis present

## 2015-08-06 DIAGNOSIS — R944 Abnormal results of kidney function studies: Secondary | ICD-10-CM | POA: Diagnosis not present

## 2015-08-06 DIAGNOSIS — E119 Type 2 diabetes mellitus without complications: Secondary | ICD-10-CM | POA: Diagnosis not present

## 2015-08-06 LAB — COMPREHENSIVE METABOLIC PANEL
ALT: 18 U/L (ref 14–54)
ANION GAP: 6 (ref 5–15)
AST: 37 U/L (ref 15–41)
Albumin: 3.8 g/dL (ref 3.5–5.0)
Alkaline Phosphatase: 31 U/L — ABNORMAL LOW (ref 38–126)
BUN: 23 mg/dL — AB (ref 6–20)
CHLORIDE: 111 mmol/L (ref 101–111)
CO2: 24 mmol/L (ref 22–32)
Calcium: 9 mg/dL (ref 8.9–10.3)
Creatinine, Ser: 0.86 mg/dL (ref 0.44–1.00)
Glucose, Bld: 137 mg/dL — ABNORMAL HIGH (ref 65–99)
Potassium: 4.1 mmol/L (ref 3.5–5.1)
SODIUM: 141 mmol/L (ref 135–145)
Total Bilirubin: 0.7 mg/dL (ref 0.3–1.2)
Total Protein: 8.5 g/dL — ABNORMAL HIGH (ref 6.5–8.1)

## 2015-08-06 LAB — CBC WITH DIFFERENTIAL/PLATELET
Basophils Absolute: 0 10*3/uL (ref 0.0–0.1)
Basophils Relative: 0 %
EOS ABS: 0 10*3/uL (ref 0.0–0.7)
EOS PCT: 0 %
HCT: 29.7 % — ABNORMAL LOW (ref 36.0–46.0)
Hemoglobin: 9.6 g/dL — ABNORMAL LOW (ref 12.0–15.0)
LYMPHS ABS: 1.6 10*3/uL (ref 0.7–4.0)
LYMPHS PCT: 32 %
MCH: 27.8 pg (ref 26.0–34.0)
MCHC: 32.3 g/dL (ref 30.0–36.0)
MCV: 86.1 fL (ref 78.0–100.0)
MONO ABS: 0.3 10*3/uL (ref 0.1–1.0)
MONOS PCT: 6 %
Neutro Abs: 3 10*3/uL (ref 1.7–7.7)
Neutrophils Relative %: 62 %
PLATELETS: 211 10*3/uL (ref 150–400)
RBC: 3.45 MIL/uL — ABNORMAL LOW (ref 3.87–5.11)
RDW: 14.7 % (ref 11.5–15.5)
WBC: 4.9 10*3/uL (ref 4.0–10.5)

## 2015-08-06 MED ORDER — SODIUM CHLORIDE 0.9 % IV SOLN
60.0000 mg | INTRAVENOUS | Status: DC
  Administered 2015-08-06: 60 mg via INTRAVENOUS
  Filled 2015-08-06: qty 7

## 2015-08-06 MED ORDER — ACETAMINOPHEN 160 MG/5ML PO SOLN
500.0000 mg | ORAL | Status: DC
  Administered 2015-08-06: 500 mg
  Filled 2015-08-06: qty 20.3

## 2015-08-06 MED ORDER — SODIUM CHLORIDE 0.9 % IV SOLN
INTRAVENOUS | Status: DC
  Administered 2015-08-06: 10:00:00 via INTRAVENOUS

## 2015-08-06 NOTE — Discharge Instructions (Signed)
Fabrazyme °Agalsidase Beta injection °What is this medicine? °AGALSIDASE BETA is used to replace an enzyme that is missing in patients with Fabry disease. It is not a cure. °This medicine may be used for other purposes; ask your health care provider or pharmacist if you have questions. °What should I tell my health care provider before I take this medicine? °They need to know if you have any of these conditions: °-heart disease °-an unusual or allergic reaction to agalsidase beta, mannitol, other medicines, foods, dyes, or preservatives °-pregnant or trying to get pregnant °-breast-feeding °How should I use this medicine? °This medicine is for infusion into a vein. It is given by a health care professional in a hospital or clinic setting. °Talk to your pediatrician regarding the use of this medicine in children. Special care may be needed. °Overdosage: If you think you have taken too much of this medicine contact a poison control center or emergency room at once. °NOTE: This medicine is only for you. Do not share this medicine with others. °What if I miss a dose? °It is important not to miss your dose. Call your doctor or health care professional if you are unable to keep an appointment. °What may interact with this medicine? °-amiodarone °-chloroquine °-gentamicin °-hydroxychloroquine °-monobenzone °This list may not describe all possible interactions. Give your health care provider a list of all the medicines, herbs, non-prescription drugs, or dietary supplements you use. Also tell them if you smoke, drink alcohol, or use illegal drugs. Some items may interact with your medicine. °What should I watch for while using this medicine? °Visit your doctor or health care professional for regular checks on your progress. Tell your doctor or healthcare professional if your symptoms do not start to get better or if they get worse. °There is a registry for patients with Fabry disease. The registry is used to gather  information about the disease and its effects. Talk to your health care provider if you would like to join the registry. °What side effects may I notice from receiving this medicine? °Side effects that you should report to your doctor or health care professional as soon as possible: °-allergic reactions like skin rash, itching or hives, swelling of the face, lips, or tongue °-breathing problems °-chest pain, tightness °-depression °-dizziness °-fast, irregular heart beat °-swelling of the arms or legs °Side effects that usually do not require medical attention (report to your doctor or health care professional if they continue or are bothersome): °-aches or pains °-anxiety °-fever or chills at the time of injection °-headache °-nausea, vomiting °-stomach pain, upset °This list may not describe all possible side effects. Call your doctor for medical advice about side effects. You may report side effects to FDA at 1-800-FDA-1088. °Where should I keep my medicine? °This drug is given in a hospital or clinic and will not be stored at home. °NOTE: This sheet is a summary. It may not cover all possible information. If you have questions about this medicine, talk to your doctor, pharmacist, or health care provider. °  °© 2016, Elsevier/Gold Standard. (2005-11-13 12:25:00) ° °

## 2015-08-20 ENCOUNTER — Encounter (HOSPITAL_COMMUNITY)
Admission: RE | Admit: 2015-08-20 | Discharge: 2015-08-20 | Disposition: A | Discharge status: Home / Self Care | Payer: Medicare Other | Source: Ambulatory Visit | Attending: Nephrology | Admitting: Nephrology

## 2015-08-20 ENCOUNTER — Encounter (HOSPITAL_COMMUNITY): Payer: Self-pay

## 2015-08-20 DIAGNOSIS — F17200 Nicotine dependence, unspecified, uncomplicated: Secondary | ICD-10-CM | POA: Diagnosis not present

## 2015-08-20 DIAGNOSIS — R944 Abnormal results of kidney function studies: Secondary | ICD-10-CM | POA: Insufficient documentation

## 2015-08-20 DIAGNOSIS — E779 Disorder of glycoprotein metabolism, unspecified: Secondary | ICD-10-CM | POA: Diagnosis present

## 2015-08-20 DIAGNOSIS — I10 Essential (primary) hypertension: Secondary | ICD-10-CM | POA: Insufficient documentation

## 2015-08-20 DIAGNOSIS — I4891 Unspecified atrial fibrillation: Secondary | ICD-10-CM | POA: Insufficient documentation

## 2015-08-20 DIAGNOSIS — R4701 Aphasia: Secondary | ICD-10-CM | POA: Insufficient documentation

## 2015-08-20 DIAGNOSIS — E119 Type 2 diabetes mellitus without complications: Secondary | ICD-10-CM | POA: Insufficient documentation

## 2015-08-20 MED ORDER — SODIUM CHLORIDE 0.9 % IV SOLN
INTRAVENOUS | Status: DC
  Administered 2015-08-20: 09:00:00 via INTRAVENOUS

## 2015-08-20 MED ORDER — ACETAMINOPHEN 160 MG/5ML PO SOLN
500.0000 mg | ORAL | Status: DC
  Administered 2015-08-20: 500 mg
  Filled 2015-08-20: qty 20.3

## 2015-08-20 MED ORDER — SODIUM CHLORIDE 0.9 % IV SOLN
60.0000 mg | INTRAVENOUS | Status: DC
  Administered 2015-08-20: 60 mg via INTRAVENOUS
  Filled 2015-08-20: qty 5

## 2015-08-24 LAB — HM DIABETES FOOT EXAM

## 2015-08-27 ENCOUNTER — Encounter: Payer: Self-pay | Admitting: Adult Health

## 2015-08-27 ENCOUNTER — Non-Acute Institutional Stay (SKILLED_NURSING_FACILITY): Payer: Medicare Other | Admitting: Adult Health

## 2015-08-27 DIAGNOSIS — I5042 Chronic combined systolic (congestive) and diastolic (congestive) heart failure: Secondary | ICD-10-CM

## 2015-08-27 DIAGNOSIS — D638 Anemia in other chronic diseases classified elsewhere: Secondary | ICD-10-CM | POA: Diagnosis not present

## 2015-08-27 DIAGNOSIS — R4702 Dysphasia: Secondary | ICD-10-CM | POA: Diagnosis not present

## 2015-08-27 DIAGNOSIS — I480 Paroxysmal atrial fibrillation: Secondary | ICD-10-CM | POA: Diagnosis not present

## 2015-08-27 DIAGNOSIS — I6990 Unspecified sequelae of unspecified cerebrovascular disease: Secondary | ICD-10-CM | POA: Diagnosis not present

## 2015-08-27 DIAGNOSIS — I69391 Dysphagia following cerebral infarction: Secondary | ICD-10-CM

## 2015-08-27 DIAGNOSIS — E1149 Type 2 diabetes mellitus with other diabetic neurological complication: Secondary | ICD-10-CM

## 2015-08-27 DIAGNOSIS — R4701 Aphasia: Secondary | ICD-10-CM

## 2015-08-27 DIAGNOSIS — I119 Hypertensive heart disease without heart failure: Secondary | ICD-10-CM

## 2015-08-27 NOTE — Progress Notes (Signed)
Patient ID: Rebecca [name], female   DOB: 1945/05/21, 70 y.o.   MRN: [id number]   Location:  [facility] Room Number: 111-A Place of Service:  SNF (31)   CODE STATUS: Full Code  Allergies  Allergen Reactions  . Lisinopril Swelling    Chief Complaint  Patient presents with  . Medical Management of Chronic Issues    Follow up    HPI:  She is a long term resident of this facility being seen for the management of her chronic illnesses. Staff is concerned that she appears more anxious and depressed. She is more irritable with staff members. She cannot fully participate in the hpi or ros.   Past Medical History  Diagnosis Date  . Hypertension   . 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 Expressive aphasia 03/23/2013  . Atrial fibrillation (HCC) 05/01/2013  . Tobacco abuse 05/01/2013  . Diabetes mellitus without complication (HCC)   . Depression   . Stroke (HCC)     2015  . Stroke [facility]) 2016    aphasia    Past Surgical History  Procedure Laterality Date  . Tee without cardioversion N/A 03/26/2013    Procedure: TRANSESOPHAGEAL ECHOCARDIOGRAM (TEE);  Surgeon: Thurmon Fair, MD;  Location: [facility] ENDOSCOPY;  Service: Cardiovascular;  Laterality: N/A;  . Cesarean section      x3  . Hip replacement Right 1990's  . Hip replacemet Left 1990's  . Laparoscopic gastrostomy N/A 04/20/2014    Procedure: LAPAROSCOPIC GASTROSTOMY TUBE PLACEMENT;  Surgeon: Axel Filler, MD;  Location: [facility] OR;  Service: General;  Laterality: N/A;  . Peg tube placemnt  04/15/14  . Tooth extraction Right 1/17    lower canine    Social History   Social History  . Marital Status: Single    Spouse Name: N/A  . Number of Children: N/A  . Years of Education: N/A   Occupational History  . Not on file.   Social History Main Topics  . Smoking status: Never Smoker   . Smokeless tobacco: Not on file  . Alcohol Use: No  . Drug Use: No  . Sexual Activity: Not Currently   Other  Topics Concern  . Not on file   Social History Narrative   Family History  Problem Relation Age of Onset  . Alcohol abuse Maternal Aunt       VITAL SIGNS BP 133/84 mmHg  Pulse 76  Temp(Src) 97.9 F (36.6 C) (Oral)  Resp 18  Ht  (1.626 m)  Wt 138 lb (62.596 kg)  BMI 23.68 kg/m2  SpO2 97%  Patient's Medications  New Prescriptions   No medications on file  Previous Medications   APIXABAN (ELIQUIS) 5 MG TABS TABLET    Take 5 mg by mouth 2 (two) times daily.    ATORVASTATIN (LIPITOR) 40 MG TABLET    Take 1 tablet (40 mg total) by mouth daily at 6 PM.   METOPROLOL TARTRATE (LOPRESSOR) 25 MG TABLET    Take 0.5 tablets (12.5 mg total) by mouth 2 (two) times daily.   MULTIPLE VITAMIN (MULTIVITAMIN) LIQD    Take 5 mLs by mouth daily with breakfast.   POLYETHYLENE GLYCOL (MIRALAX / GLYCOLAX) PACKET    Take 17 g by mouth daily.  Modified Medications   No medications on file  Discontinued Medications   CLONIDINE (CATAPRES - DOSED IN MG/24 HR) 0.2 MG/24HR PATCH    Place 0.2 mg onto the skin once a week.   FEEDING SUPPLEMENT, ENSURE  ENLIVE, (ENSURE ENLIVE) LIQD    Take 237 mLs by mouth 2 (two) times daily between meals.   HYDROCORTISONE (ANUSOL-HC) 25 MG SUPPOSITORY    Place 1 suppository (25 mg total) rectally 2 (two) times daily.   LORATADINE (CLARITIN) 10 MG TABLET    Take 10 mg by mouth daily with breakfast.     SIGNIFICANT DIAGNOSTIC EXAMS  07-10-14: right neck ultrasound: no abnormality identified in the area of concern in the right submandibular region   11-27-14: chest x-ray: no acute cardiopulmonary process   03-31-15: chets x-ray: mild cardiomegaly with no acute infiltrate improved from 03-30-15     LABS REVIEWED:    10-30-14: wbc 3.5; hgb 10.6; hct 35.2; mcv 86.8; plt 196; glucose 77; bun 21.5; creat 0.72; k+4.1; na++142; liver normal albumin 3.1 chol 143; ldl 73; trig 46; hdl 61 11-04-14: wbc 4.0; hgb 11.6; hct 40.7; mcv 89.9; plt 174 12-22-14: glucose 90; bun 25.2;  creat 0.80; k+ 4.2; na++ 144  01-06-15: hgb a1c 5.7  01-12-15: peg tube site: MRSA: septra ds  03-10-15: chol 129; ldl 53; trig 60; hdl 64; urine micro-albumin 2.0  03-30-15: wbc 4.7; hgb 12.9; hct 40.6; mcv 85.4; plt 148; glucose 97; bun 41; creat 1.17; k+ 4.3; na++142  blood culture: contaminant  04-01-15: urine culture: e-coli: cipro  05-05-15: hgb a1c 5.8      Review of Systems Unable to perform ROS: Patient nonverbal    Physical Exam Constitutional: No distress.  Eyes: Conjunctivae are normal.  Neck: Neck supple. No JVD present. No thyromegaly present.  Cardiovascular: Normal rate, regular rhythm and intact distal pulses.   Respiratory: Effort normal and breath sounds normal. No respiratory distress. She has no wheezes.  GI: Soft. Bowel sounds are normal. She exhibits no distension. There is no tenderness.  Has peg tube   Musculoskeletal: She exhibits no edema.  Able to move all extremities   Lymphadenopathy:    She has no cervical adenopathy.  Neurological: She is alert. is restless  Skin: Skin is warm and dry. She is not diaphoretic.  Psychiatric: She has a normal mood and affect.     ASSESSMENT/ PLAN:  1. 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 Dysphagia: no signs of aspiration present;   will continue on thin liquids with regular diet and will monitor   Is not using peg tube    3. Afib: heart rate is regular; will continue lopressor 12.5 mg twice daily for rate control; will continue eliquis 5 mg twice daily and asa 81 mg daily   4. Dyslipidemia: will continue lipitor 40 mg daily  ldl is 53   5. Hypertension: will continue lopressor 12.5 mg twice daily    6. Depression: will start her on zoloft 50 mg daily    7. Allergic rhinitis: will continue claritin 10 mg daily   8. CVA: has aphasia:  is neurologically without change; will continue asa 81 mg daily eliquis 5 mg twice daily   9. Diabetes: is  presently not on medications; her hgb a1c is 5.8. She is on  statin and asa; urine micro-albumin 2.0    Will check cbc; cmp; hgb a1c lipids.      Synthia Innocent NP [facility] Adult Medicine  Contact [phone] Monday through Friday 8am- 5pm  After hours call [phone]

## 2015-09-03 ENCOUNTER — Encounter (HOSPITAL_COMMUNITY): Payer: Self-pay

## 2015-09-03 ENCOUNTER — Encounter (HOSPITAL_COMMUNITY)
Admission: RE | Admit: 2015-09-03 | Discharge: 2015-09-03 | Disposition: A | Discharge status: Home / Self Care | Payer: Medicare Other | Source: Ambulatory Visit | Attending: Nephrology | Admitting: Nephrology

## 2015-09-03 DIAGNOSIS — F17200 Nicotine dependence, unspecified, uncomplicated: Secondary | ICD-10-CM | POA: Diagnosis not present

## 2015-09-03 DIAGNOSIS — R944 Abnormal results of kidney function studies: Secondary | ICD-10-CM | POA: Diagnosis not present

## 2015-09-03 DIAGNOSIS — E779 Disorder of glycoprotein metabolism, unspecified: Secondary | ICD-10-CM | POA: Diagnosis not present

## 2015-09-03 DIAGNOSIS — R4701 Aphasia: Secondary | ICD-10-CM | POA: Diagnosis not present

## 2015-09-03 DIAGNOSIS — I10 Essential (primary) hypertension: Secondary | ICD-10-CM | POA: Diagnosis not present

## 2015-09-03 DIAGNOSIS — I4891 Unspecified atrial fibrillation: Secondary | ICD-10-CM | POA: Diagnosis not present

## 2015-09-03 DIAGNOSIS — E119 Type 2 diabetes mellitus without complications: Secondary | ICD-10-CM | POA: Diagnosis not present

## 2015-09-03 LAB — CBC WITH DIFFERENTIAL/PLATELET
Basophils Absolute: 0 10*3/uL (ref 0.0–0.1)
Basophils Relative: 1 %
Eosinophils Absolute: 0 10*3/uL (ref 0.0–0.7)
Eosinophils Relative: 2 %
HEMATOCRIT: 33.5 % — AB (ref 36.0–46.0)
Hemoglobin: 11.2 g/dL — ABNORMAL LOW (ref 12.0–15.0)
LYMPHS PCT: 49 %
Lymphs Abs: 1.4 10*3/uL (ref 0.7–4.0)
MCH: 27.1 pg (ref 26.0–34.0)
MCHC: 33.4 g/dL (ref 30.0–36.0)
MCV: 80.9 fL (ref 78.0–100.0)
MONO ABS: 0.3 10*3/uL (ref 0.1–1.0)
MONOS PCT: 11 %
NEUTROS ABS: 1 10*3/uL — AB (ref 1.7–7.7)
Neutrophils Relative %: 37 %
Platelets: 201 10*3/uL (ref 150–400)
RBC: 4.14 MIL/uL (ref 3.87–5.11)
RDW: 14.9 % (ref 11.5–15.5)
WBC: 2.7 10*3/uL — ABNORMAL LOW (ref 4.0–10.5)

## 2015-09-03 LAB — COMPREHENSIVE METABOLIC PANEL
ALBUMIN: 3.9 g/dL (ref 3.5–5.0)
ALK PHOS: 30 U/L — AB (ref 38–126)
ALT: 19 U/L (ref 14–54)
AST: 41 U/L (ref 15–41)
Anion gap: 10 (ref 5–15)
BILIRUBIN TOTAL: 1.5 mg/dL — AB (ref 0.3–1.2)
BUN: 22 mg/dL — AB (ref 6–20)
CALCIUM: 9.2 mg/dL (ref 8.9–10.3)
CO2: 22 mmol/L (ref 22–32)
CREATININE: 0.89 mg/dL (ref 0.44–1.00)
Chloride: 108 mmol/L (ref 101–111)
GFR calc Af Amer: >60 mL/min (ref >60)
GFR calc non Af Amer: >60 mL/min (ref >60)
GLUCOSE: 117 mg/dL — AB (ref 65–99)
POTASSIUM: 4.3 mmol/L (ref 3.5–5.1)
Sodium: 140 mmol/L (ref 135–145)
TOTAL PROTEIN: 8.3 g/dL — AB (ref 6.5–8.1)

## 2015-09-03 MED ORDER — SODIUM CHLORIDE 0.9 % IV SOLN
60.0000 mg | INTRAVENOUS | Status: DC
  Administered 2015-09-03: 60 mg via INTRAVENOUS
  Filled 2015-09-03: qty 5

## 2015-09-03 MED ORDER — SODIUM CHLORIDE 0.9 % IV SOLN
INTRAVENOUS | Status: DC
  Administered 2015-09-03: 10:00:00 via INTRAVENOUS

## 2015-09-03 MED ORDER — ACETAMINOPHEN 160 MG/5ML PO SOLN
500.0000 mg | ORAL | Status: DC
  Administered 2015-09-03: 500 mg
  Filled 2015-09-03: qty 20.3

## 2015-09-03 NOTE — Discharge Instructions (Signed)
Agalsidase Beta injection °What is this medicine? °AGALSIDASE BETA is used to replace an enzyme that is missing in patients with Fabry disease. It is not a cure. °This medicine may be used for other purposes; ask your health care provider or pharmacist if you have questions. °What should I tell my health care provider before I take this medicine? °They need to know if you have any of these conditions: °-heart disease °-an unusual or allergic reaction to agalsidase beta, mannitol, other medicines, foods, dyes, or preservatives °-pregnant or trying to get pregnant °-breast-feeding °How should I use this medicine? °This medicine is for infusion into a vein. It is given by a health care professional in a hospital or clinic setting. °Talk to your pediatrician regarding the use of this medicine in children. Special care may be needed. °Overdosage: If you think you have taken too much of this medicine contact a poison control center or emergency room at once. °NOTE: This medicine is only for you. Do not share this medicine with others. °What if I miss a dose? °It is important not to miss your dose. Call your doctor or health care professional if you are unable to keep an appointment. °What may interact with this medicine? °-amiodarone °-chloroquine °-gentamicin °-hydroxychloroquine °-monobenzone °This list may not describe all possible interactions. Give your health care provider a list of all the medicines, herbs, non-prescription drugs, or dietary supplements you use. Also tell them if you smoke, drink alcohol, or use illegal drugs. Some items may interact with your medicine. °What should I watch for while using this medicine? °Visit your doctor or health care professional for regular checks on your progress. Tell your doctor or healthcare professional if your symptoms do not start to get better or if they get worse. °There is a registry for patients with Fabry disease. The registry is used to gather information about  the disease and its effects. Talk to your health care provider if you would like to join the registry. °What side effects may I notice from receiving this medicine? °Side effects that you should report to your doctor or health care professional as soon as possible: °-allergic reactions like skin rash, itching or hives, swelling of the face, lips, or tongue °-breathing problems °-chest pain, tightness °-depression °-dizziness °-fast, irregular heart beat °-swelling of the arms or legs °Side effects that usually do not require medical attention (report to your doctor or health care professional if they continue or are bothersome): °-aches or pains °-anxiety °-fever or chills at the time of injection °-headache °-nausea, vomiting °-stomach pain, upset °This list may not describe all possible side effects. Call your doctor for medical advice about side effects. You may report side effects to FDA at 1-800-FDA-1088. °Where should I keep my medicine? °This drug is given in a hospital or clinic and will not be stored at home. °NOTE: This sheet is a summary. It may not cover all possible information. If you have questions about this medicine, talk to your doctor, pharmacist, or health care provider. °  °© 2016, Elsevier/Gold Standard. (2005-11-13 12:25:00) ° °

## 2015-09-06 LAB — CBC AND DIFFERENTIAL
HCT: 40 % (ref 36–46)
HCT: 40 % (ref 36–46)
HEMOGLOBIN: 11.3 g/dL — AB (ref 12.0–16.0)
Hemoglobin: 11.3 g/dL — AB (ref 12.0–16.0)
Platelets: 199 10*3/uL (ref 150–399)
Platelets: 199 10*3/uL (ref 150–399)
WBC: 3.2 10^3/mL
WBC: 3.2 10^3/mL

## 2015-09-08 ENCOUNTER — Encounter: Payer: Self-pay | Admitting: Adult Health

## 2015-09-08 ENCOUNTER — Non-Acute Institutional Stay (SKILLED_NURSING_FACILITY): Payer: Medicare Other | Admitting: Adult Health

## 2015-09-08 DIAGNOSIS — I6990 Unspecified sequelae of unspecified cerebrovascular disease: Secondary | ICD-10-CM | POA: Diagnosis not present

## 2015-09-08 DIAGNOSIS — E7521 Fabry (-Anderson) disease: Secondary | ICD-10-CM | POA: Diagnosis not present

## 2015-09-08 NOTE — Progress Notes (Signed)
Patient ID: Rebecca [name], female   DOB: 19-Apr-1945, 70 y.o.   MRN: [id number]   Location:  [facility] Room Number: 111-A Place of Service:  SNF (31)   CODE STATUS: Full Code  Allergies  Allergen Reactions  . Lisinopril Swelling    Chief Complaint  Patient presents with  . Acute Visit    Status Change    HPI:   I have been asked to see her for a change in her status. Staff reports that she is less engaging; requires increased assistance with her meals and is leaning to the right side. She is unable to participate in the hpi or ros. There have been no change in her vital signs.   Past Medical History  Diagnosis Date  . Hypertension   . 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 Expressive aphasia 03/23/2013  . Atrial fibrillation (HCC) 05/01/2013  . Tobacco abuse 05/01/2013  . Diabetes mellitus without complication (HCC)   . Depression   . Stroke (HCC)     2015  . Stroke [facility]) 2016    aphasia    Past Surgical History  Procedure Laterality Date  . Tee without cardioversion N/A 03/26/2013    Procedure: TRANSESOPHAGEAL ECHOCARDIOGRAM (TEE);  Surgeon: Thurmon Fair, MD;  Location: [facility] ENDOSCOPY;  Service: Cardiovascular;  Laterality: N/A;  . Cesarean section      x3  . Hip replacement Right 1990's  . Hip replacemet Left 1990's  . Laparoscopic gastrostomy N/A 04/20/2014    Procedure: LAPAROSCOPIC GASTROSTOMY TUBE PLACEMENT;  Surgeon: Axel Filler, MD;  Location: [facility] OR;  Service: General;  Laterality: N/A;  . Peg tube placemnt  04/15/14  . Tooth extraction Right 1/17    lower canine    Social History   Social History  . Marital Status: Single    Spouse Name: N/A  . Number of Children: N/A  . Years of Education: N/A   Occupational History  . Not on file.   Social History Main Topics  . Smoking status: Never Smoker   . Smokeless tobacco: Not on file  . Alcohol Use: No  . Drug Use: No  . Sexual Activity: Not Currently   Other Topics  Concern  . Not on file   Social History Narrative   Family History  Problem Relation Age of Onset  . Alcohol abuse Maternal Aunt       VITAL SIGNS BP 100/80 mmHg  Pulse 82  Temp(Src) 97.6 F (36.4 C) (Oral)  Resp 16  Ht  (1.626 m)  Wt 138 lb (62.596 kg)  BMI 23.68 kg/m2  SpO2 98%  Patient's Medications  New Prescriptions   No medications on file  Previous Medications   APIXABAN (ELIQUIS) 5 MG TABS TABLET    Take 5 mg by mouth 2 (two) times daily.    ATORVASTATIN (LIPITOR) 40 MG TABLET    Take 1 tablet (40 mg total) by mouth daily at 6 PM.   BISMUTH TRIBROMOPH-PETROLATUM (XEROFORM PETROLAT GAUZE 1"X8" EX)    Apply topically. Apply to 2nd toe of Right foot topically every day shift every 3 days   METOPROLOL TARTRATE (LOPRESSOR) 25 MG TABLET    Take 0.5 tablets (12.5 mg total) by mouth 2 (two) times daily.   MULTIPLE VITAMIN (MULTIVITAMIN) LIQD    Take 5 mLs by mouth daily with breakfast.   NUTRITIONAL SUPPLEMENTS (NUTRITIONAL SUPPLEMENT PO)    Take 60 mLs by mouth 2 (two) times daily. Med Pass, Mech soft texture,  regular consistency   POLYETHYLENE GLYCOL (MIRALAX / GLYCOLAX) PACKET    Take 17 g by mouth daily.   SERTRALINE (ZOLOFT) 50 MG TABLET    Take 50 mg by mouth daily.  Modified Medications   No medications on file  Discontinued Medications   No medications on file     SIGNIFICANT DIAGNOSTIC EXAMS  10-30-14: wbc 3.5; hgb 10.6; hct 35.2; mcv 86.8; plt 196; glucose 77; bun 21.5; creat 0.72; k+4.1; na++142; liver normal albumin 3.1 chol 143; ldl 73; trig 46; hdl 61 11-04-14: wbc 4.0; hgb 11.6; hct 40.7; mcv 89.9; plt 174 12-22-14: glucose 90; bun 25.2; creat 0.80; k+ 4.2; na++ 144  01-06-15: hgb a1c 5.7  01-12-15: peg tube site: MRSA: septra ds  03-10-15: chol 129; ldl 53; trig 60; hdl 64; urine micro-albumin 2.0  03-30-15: wbc 4.7; hgb 12.9; hct 40.6; mcv 85.4; plt 148; glucose 97; bun 41; creat 1.17; k+ 4.3; na++142  blood culture: contaminant  04-01-15: urine  culture: e-coli: cipro  05-05-15: hgb a1c 5.8        Review of Systems Unable to perform ROS: Patient nonverbal    Physical Exam Constitutional: No distress.  Eyes: Conjunctivae are normal.  Neck: Neck supple. No JVD present. No thyromegaly present.  Cardiovascular: Normal rate, regular rhythm and intact distal pulses.   Respiratory: Effort normal and breath sounds normal. No respiratory distress. She has no wheezes.  GI: Soft. Bowel sounds are normal. She exhibits no distension. There is no tenderness.  Has peg tube   Musculoskeletal: She exhibits no edema.  Is leaning more to the right side  Lymphadenopathy:    She has no cervical adenopathy.  Neurological: She is alert.  Skin: Skin is warm and dry. She is not diaphoretic.  Psychiatric: is less engaging with those around her.     ASSESSMENT/ PLAN:  1. 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 CVA: has aphasia; will continue asa 81 mg daily eliquis 5 mg twice daily ; more than likely she has had another cva or a TIA; either way; there is no further intervention at this time; will continue to monitor her status.     Synthia Innocent NP [facility] Adult [facility]  Contact [phone] Monday through Friday 8am- 5pm  After hours call [phone]

## 2015-09-09 ENCOUNTER — Other Ambulatory Visit: Payer: Self-pay | Admitting: *Deleted

## 2015-09-16 DIAGNOSIS — E7521 Fabry (-Anderson) disease: Secondary | ICD-10-CM | POA: Insufficient documentation

## 2015-09-17 ENCOUNTER — Encounter (HOSPITAL_COMMUNITY)
Admission: RE | Admit: 2015-09-17 | Discharge: 2015-09-17 | Disposition: A | Discharge status: Home / Self Care | Payer: Medicare Other | Source: Ambulatory Visit | Attending: Nephrology | Admitting: Nephrology

## 2015-09-17 ENCOUNTER — Encounter (HOSPITAL_COMMUNITY): Payer: Self-pay

## 2015-09-17 DIAGNOSIS — I4891 Unspecified atrial fibrillation: Secondary | ICD-10-CM | POA: Diagnosis not present

## 2015-09-17 DIAGNOSIS — R944 Abnormal results of kidney function studies: Secondary | ICD-10-CM | POA: Diagnosis not present

## 2015-09-17 DIAGNOSIS — E779 Disorder of glycoprotein metabolism, unspecified: Secondary | ICD-10-CM | POA: Diagnosis present

## 2015-09-17 DIAGNOSIS — F17200 Nicotine dependence, unspecified, uncomplicated: Secondary | ICD-10-CM | POA: Diagnosis not present

## 2015-09-17 DIAGNOSIS — R4701 Aphasia: Secondary | ICD-10-CM | POA: Diagnosis not present

## 2015-09-17 DIAGNOSIS — E119 Type 2 diabetes mellitus without complications: Secondary | ICD-10-CM | POA: Diagnosis not present

## 2015-09-17 DIAGNOSIS — I10 Essential (primary) hypertension: Secondary | ICD-10-CM | POA: Diagnosis not present

## 2015-09-17 HISTORY — DX: Fabry (-anderson) disease: E75.21

## 2015-09-17 MED ORDER — SODIUM CHLORIDE 0.9 % IV SOLN
INTRAVENOUS | Status: DC
  Administered 2015-09-17: 10:00:00 via INTRAVENOUS

## 2015-09-17 MED ORDER — SODIUM CHLORIDE 0.9 % IV SOLN
60.0000 mg | INTRAVENOUS | Status: DC
  Administered 2015-09-17: 60 mg via INTRAVENOUS
  Filled 2015-09-17: qty 5

## 2015-09-17 MED ORDER — ACETAMINOPHEN 160 MG/5ML PO SOLN: 500.0000 mg | ORAL | Status: DC

## 2015-09-17 MED ORDER — ACETAMINOPHEN 500 MG PO TABS
500.0000 mg | ORAL_TABLET | Freq: Once | ORAL | Status: AC
  Administered 2015-09-17: 500 mg via ORAL
  Filled 2015-09-17: qty 1

## 2015-09-17 NOTE — Discharge Instructions (Signed)
Agalsidase Beta injection °What is this medicine? °AGALSIDASE BETA is used to replace an enzyme that is missing in patients with Fabry disease. It is not a cure. °This medicine may be used for other purposes; ask your health care provider or pharmacist if you have questions. °What should I tell my health care provider before I take this medicine? °They need to know if you have any of these conditions: °-heart disease °-an unusual or allergic reaction to agalsidase beta, mannitol, other medicines, foods, dyes, or preservatives °-pregnant or trying to get pregnant °-breast-feeding °How should I use this medicine? °This medicine is for infusion into a vein. It is given by a health care professional in a hospital or clinic setting. °Talk to your pediatrician regarding the use of this medicine in children. Special care may be needed. °Overdosage: If you think you have taken too much of this medicine contact a poison control center or emergency room at once. °NOTE: This medicine is only for you. Do not share this medicine with others. °What if I miss a dose? °It is important not to miss your dose. Call your doctor or health care professional if you are unable to keep an appointment. °What may interact with this medicine? °-amiodarone °-chloroquine °-gentamicin °-hydroxychloroquine °-monobenzone °This list may not describe all possible interactions. Give your health care provider a list of all the medicines, herbs, non-prescription drugs, or dietary supplements you use. Also tell them if you smoke, drink alcohol, or use illegal drugs. Some items may interact with your medicine. °What should I watch for while using this medicine? °Visit your doctor or health care professional for regular checks on your progress. Tell your doctor or healthcare professional if your symptoms do not start to get better or if they get worse. °There is a registry for patients with Fabry disease. The registry is used to gather information about  the disease and its effects. Talk to your health care provider if you would like to join the registry. °What side effects may I notice from receiving this medicine? °Side effects that you should report to your doctor or health care professional as soon as possible: °-allergic reactions like skin rash, itching or hives, swelling of the face, lips, or tongue °-breathing problems °-chest pain, tightness °-depression °-dizziness °-fast, irregular heart beat °-swelling of the arms or legs °Side effects that usually do not require medical attention (report to your doctor or health care professional if they continue or are bothersome): °-aches or pains °-anxiety °-fever or chills at the time of injection °-headache °-nausea, vomiting °-stomach pain, upset °This list may not describe all possible side effects. Call your doctor for medical advice about side effects. You may report side effects to FDA at 1-800-FDA-1088. °Where should I keep my medicine? °This drug is given in a hospital or clinic and will not be stored at home. °NOTE: This sheet is a summary. It may not cover all possible information. If you have questions about this medicine, talk to your doctor, pharmacist, or health care provider. °  °© 2016, Elsevier/Gold Standard. (2005-11-13 12:25:00) ° °

## 2015-09-17 NOTE — Progress Notes (Signed)
States name and birthdate today. Takes tylenol tablet cut in half with apple sauce and sips of apple juice, tolerates well.

## 2015-09-28 ENCOUNTER — Other Ambulatory Visit: Payer: Self-pay | Admitting: Internal Medicine

## 2015-09-28 DIAGNOSIS — Z1231 Encounter for screening mammogram for malignant neoplasm of breast: Secondary | ICD-10-CM

## 2015-10-01 ENCOUNTER — Encounter (HOSPITAL_COMMUNITY): Admission: RE | Admit: 2015-10-01 | Payer: Medicare Other | Source: Ambulatory Visit

## 2015-10-01 ENCOUNTER — Non-Acute Institutional Stay (SKILLED_NURSING_FACILITY): Payer: Medicare Other | Admitting: Adult Health

## 2015-10-01 ENCOUNTER — Encounter: Payer: Self-pay | Admitting: Adult Health

## 2015-10-01 DIAGNOSIS — E1149 Type 2 diabetes mellitus with other diabetic neurological complication: Secondary | ICD-10-CM | POA: Diagnosis not present

## 2015-10-01 DIAGNOSIS — D638 Anemia in other chronic diseases classified elsewhere: Secondary | ICD-10-CM

## 2015-10-01 DIAGNOSIS — I480 Paroxysmal atrial fibrillation: Secondary | ICD-10-CM | POA: Diagnosis not present

## 2015-10-01 DIAGNOSIS — I5042 Chronic combined systolic (congestive) and diastolic (congestive) heart failure: Secondary | ICD-10-CM | POA: Diagnosis not present

## 2015-10-01 DIAGNOSIS — R482 Apraxia: Secondary | ICD-10-CM

## 2015-10-01 DIAGNOSIS — I69391 Dysphagia following cerebral infarction: Secondary | ICD-10-CM | POA: Diagnosis not present

## 2015-10-01 DIAGNOSIS — E7521 Fabry (-Anderson) disease: Secondary | ICD-10-CM

## 2015-10-01 DIAGNOSIS — I6990 Unspecified sequelae of unspecified cerebrovascular disease: Secondary | ICD-10-CM

## 2015-10-01 DIAGNOSIS — E785 Hyperlipidemia, unspecified: Secondary | ICD-10-CM | POA: Diagnosis not present

## 2015-10-01 DIAGNOSIS — I69390 Apraxia following cerebral infarction: Secondary | ICD-10-CM

## 2015-10-01 DIAGNOSIS — I6930 Unspecified sequelae of cerebral infarction: Secondary | ICD-10-CM

## 2015-10-01 NOTE — Progress Notes (Signed)
Location:   [facility] Room Number: 111-A Place of Service:  SNF (31)   CODE STATUS: Full Code  Allergies  Allergen Reactions  . Lisinopril Swelling    Chief Complaint  Patient presents with  . Medical Management of Chronic Issues    Follow up    HPI:  She is a long term resident of this facility  being seen for the management of her chronic illnesses. Overall her status is stable. She is unable to fully participate in the hpi or ros. Her peg tube has come out; she does not require this tube. There are no nursing concerns at this time.    Past Medical History  Diagnosis Date  . Hypertension   . 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 Expressive aphasia 03/23/2013  . Atrial fibrillation (HCC) 05/01/2013  . Tobacco abuse 05/01/2013  . Diabetes mellitus without complication (HCC)   . Depression   . Stroke (HCC)     2015  . Stroke [facility]) 2016    aphasia  . Fabry's disease [facility])     Past Surgical History  Procedure Laterality Date  . Tee without cardioversion N/A 03/26/2013    Procedure: TRANSESOPHAGEAL ECHOCARDIOGRAM (TEE);  Surgeon: Thurmon Fair, MD;  Location: [facility] ENDOSCOPY;  Service: Cardiovascular;  Laterality: N/A;  . Cesarean section      x3  . Hip replacement Right 1990's  . Hip replacemet Left 1990's  . Laparoscopic gastrostomy N/A 04/20/2014    Procedure: LAPAROSCOPIC GASTROSTOMY TUBE PLACEMENT;  Surgeon: Axel Filler, MD;  Location: [facility] OR;  Service: General;  Laterality: N/A;  . Peg tube placemnt  04/15/14  . Tooth extraction Right 1/17    lower canine    Social History   Social History  . Marital Status: Single    Spouse Name: N/A  . Number of Children: N/A  . Years of Education: N/A   Occupational History  . Not on file.   Social History Main Topics  . Smoking status: Never Smoker   . Smokeless tobacco: Not on file  . Alcohol Use: No  . Drug Use: No  . Sexual Activity: Not Currently   Other Topics Concern  . Not on file   Social  History Narrative   Family History  Problem Relation Age of Onset  . Alcohol abuse Maternal Aunt       VITAL SIGNS BP 123/76 mmHg  Pulse 82  Temp(Src) 97.3 F (36.3 C) (Oral)  Resp 18  Ht 5\' 4"  (1.626 m)  Wt 126 lb (57.153 kg)  BMI 21.62 kg/m2  SpO2 98%  Patient's Medications  New Prescriptions   No medications on file  Previous Medications   APIXABAN (ELIQUIS) 5 MG TABS TABLET    Take 5 mg by mouth 2 (two) times daily.    ATORVASTATIN (LIPITOR) 40 MG TABLET    Take 1 tablet (40 mg total) by mouth daily at 6 PM.   BISMUTH TRIBROMOPH-PETROLATUM (XEROFORM PETROLAT GAUZE 1"X8" EX)    Apply topically. Apply to 2nd toe of Right foot topically every day shift every 3 days   METOPROLOL TARTRATE (LOPRESSOR) 25 MG TABLET    Take 0.5 tablets (12.5 mg total) by mouth 2 (two) times daily.   MULTIPLE VITAMIN (MULTI VITAMIN DAILY PO)    Take 1 tablet by mouth daily.   NUTRITIONAL SUPPLEMENTS (NUTRITIONAL SUPPLEMENT PO)    Take 120 mLs by mouth 2 (two) times daily. Med Pass, Mech soft texture, regular consistency   SERTRALINE (ZOLOFT) 50 MG  TABLET    Take 50 mg by mouth daily.  Modified Medications   No medications on file  Discontinued Medications   MULTIPLE VITAMIN (MULTIVITAMIN) LIQD    Take 5 mLs by mouth daily with breakfast. Reported on 10/01/2015   POLYETHYLENE GLYCOL (MIRALAX / GLYCOLAX) PACKET    Take 17 g by mouth daily. Reported on 10/01/2015     SIGNIFICANT DIAGNOSTIC EXAMS  10-30-14: wbc 3.5; hgb 10.6; hct 35.2; mcv 86.8; plt 196; glucose 77; bun 21.5; creat 0.72; k+4.1; na++142; liver normal albumin 3.1 chol 143; ldl 73; trig 46; hdl 61 11-04-14: wbc 4.0; hgb 11.6; hct 40.7; mcv 89.9; plt 174 12-22-14: glucose 90; bun 25.2; creat 0.80; k+ 4.2; na++ 144  01-06-15: hgb a1c 5.7  01-12-15: peg tube site: MRSA: septra ds  03-10-15: chol 129; ldl 53; trig 60; hdl 64; urine micro-albumin 2.0  03-30-15: wbc 4.7; hgb 12.9; hct 40.6; mcv 85.4; plt 148; glucose 97; bun 41; creat 1.17; k+  4.3; na++142  blood culture: contaminant  04-01-15: urine culture: e-coli: cipro  05-05-15: hgb a1c 5.8  08-28-15: wbc 3.1; hgb 10.1; hct 34.9; mcv 86.5; plt 194; glucose 94; bun 20.9; creat 0.76; k+ 3.9; na++ 144; liver normal albumin 3.6; hgb a1c 5.5 09-06-15: wbc 3.2; hgb 11.2; hct 39.7; mcv 85.7 plt 199        Review of Systems Unable to perform ROS: Patient nonverbal    Physical Exam Constitutional: No distress.  Eyes: Conjunctivae are normal.  Neck: Neck supple. No JVD present. No thyromegaly present.  Cardiovascular: Normal rate, regular rhythm and intact distal pulses.   Respiratory: Effort normal and breath sounds normal. No respiratory distress. She has no wheezes.  GI: Soft. Bowel sounds are normal. She exhibits no distension. There is no tenderness.   Musculoskeletal: She exhibits no edema. Is able to move all extremities  Lymphadenopathy:    She has no cervical adenopathy.  Neurological: She is alert.  Skin: Skin is warm and dry. She is not diaphoretic.  Psychiatric: does engage .     ASSESSMENT/ PLAN:  1. 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 Dysphagia: no signs of aspiration present;   will continue on thin liquids with regular diet and will monitor  Her peg tube is out.   3. Afib: heart rate is regular; will continue lopressor 12.5 mg twice daily for rate control; will continue eliquis 5 mg twice daily   4. Dyslipidemia: will continue lipitor 40 mg daily  ldl is 53   5. Hypertension: will continue lopressor 12.5 mg twice daily will increase her clonidine patch to 0.2 mg weekly the lisinopril was stopped due to angioedema   6. Depression: is presently stable her lexapro has been stopped; will not make changes will monitor   7. Allergic rhinitis: will continue claritin 10 mg daily   8. CVA: has aphasia:  is neurologically without change; will continue  eliquis 5 mg twice daily   9. Diabetes:  is presently not on medications; her hgb a1c is 5.5. She is on  statin and asa; urine micro-albumin 2.0  10. Chronic systolic and diastolic heart failure: is presently stable; will not make changes and will monitor her status.    Will check lipids and urine for micro-albumin    Synthia Innocent NP [facility]  Contact [phone] Monday through Friday 8am- 5pm  After hours call [phone]

## 2015-10-02 LAB — LIPID PANEL
Cholesterol: 141 mg/dL (ref 0–200)
HDL: 59 mg/dL (ref 35–70)
LDL Cholesterol: 66 mg/dL
LDL/HDL RATIO: 2.4
TRIGLYCERIDES: 60 mg/dL (ref 40–160)

## 2015-10-02 LAB — MICROALBUMIN, URINE: Microalb, Ur: 6.6

## 2015-10-05 ENCOUNTER — Ambulatory Visit
Admission: RE | Admit: 2015-10-05 | Discharge: 2015-10-05 | Disposition: A | Discharge status: Home / Self Care | Payer: Medicare Other | Source: Ambulatory Visit | Attending: Internal Medicine | Admitting: Internal Medicine

## 2015-10-05 DIAGNOSIS — Z1231 Encounter for screening mammogram for malignant neoplasm of breast: Secondary | ICD-10-CM

## 2015-10-11 ENCOUNTER — Encounter (HOSPITAL_COMMUNITY): Payer: Self-pay

## 2015-10-11 ENCOUNTER — Emergency Department (HOSPITAL_COMMUNITY): Payer: Medicare Other

## 2015-10-11 ENCOUNTER — Emergency Department (HOSPITAL_COMMUNITY)
Admission: EM | Admit: 2015-10-11 | Discharge: 2015-10-11 | Disposition: A | Discharge status: Home / Self Care | Payer: Medicare Other | Attending: Emergency Medicine | Admitting: Emergency Medicine

## 2015-10-11 DIAGNOSIS — N390 Urinary tract infection, site not specified: Secondary | ICD-10-CM

## 2015-10-11 DIAGNOSIS — S0011XA Contusion of right eyelid and periocular area, initial encounter: Secondary | ICD-10-CM | POA: Diagnosis not present

## 2015-10-11 DIAGNOSIS — Z8673 Personal history of transient ischemic attack (TIA), and cerebral infarction without residual deficits: Secondary | ICD-10-CM | POA: Insufficient documentation

## 2015-10-11 DIAGNOSIS — Z7901 Long term (current) use of anticoagulants: Secondary | ICD-10-CM | POA: Diagnosis not present

## 2015-10-11 DIAGNOSIS — Y999 Unspecified external cause status: Secondary | ICD-10-CM | POA: Diagnosis not present

## 2015-10-11 DIAGNOSIS — W228XXA Striking against or struck by other objects, initial encounter: Secondary | ICD-10-CM | POA: Insufficient documentation

## 2015-10-11 DIAGNOSIS — Y939 Activity, unspecified: Secondary | ICD-10-CM | POA: Diagnosis not present

## 2015-10-11 DIAGNOSIS — Y929 Unspecified place or not applicable: Secondary | ICD-10-CM | POA: Insufficient documentation

## 2015-10-11 DIAGNOSIS — F329 Major depressive disorder, single episode, unspecified: Secondary | ICD-10-CM | POA: Insufficient documentation

## 2015-10-11 DIAGNOSIS — S0093XA Contusion of unspecified part of head, initial encounter: Secondary | ICD-10-CM | POA: Diagnosis not present

## 2015-10-11 DIAGNOSIS — Z79899 Other long term (current) drug therapy: Secondary | ICD-10-CM | POA: Insufficient documentation

## 2015-10-11 DIAGNOSIS — S0990XA Unspecified injury of head, initial encounter: Secondary | ICD-10-CM | POA: Diagnosis present

## 2015-10-11 DIAGNOSIS — I5043 Acute on chronic combined systolic (congestive) and diastolic (congestive) heart failure: Secondary | ICD-10-CM | POA: Diagnosis not present

## 2015-10-11 DIAGNOSIS — S060X9A Concussion with loss of consciousness of unspecified duration, initial encounter: Secondary | ICD-10-CM | POA: Insufficient documentation

## 2015-10-11 DIAGNOSIS — I110 Hypertensive heart disease with heart failure: Secondary | ICD-10-CM | POA: Diagnosis not present

## 2015-10-11 DIAGNOSIS — E119 Type 2 diabetes mellitus without complications: Secondary | ICD-10-CM | POA: Insufficient documentation

## 2015-10-11 LAB — BASIC METABOLIC PANEL
ANION GAP: 7 (ref 5–15)
BUN: 20 mg/dL (ref 6–20)
CALCIUM: 9.1 mg/dL (ref 8.9–10.3)
CO2: 24 mmol/L (ref 22–32)
Chloride: 111 mmol/L (ref 101–111)
Creatinine, Ser: 0.92 mg/dL (ref 0.44–1.00)
Glucose, Bld: 112 mg/dL — ABNORMAL HIGH (ref 65–99)
Potassium: 3.8 mmol/L (ref 3.5–5.1)
Sodium: 142 mmol/L (ref 135–145)

## 2015-10-11 LAB — URINALYSIS, ROUTINE W REFLEX MICROSCOPIC
Bilirubin Urine: NEGATIVE
Glucose, UA: NEGATIVE mg/dL
Hgb urine dipstick: NEGATIVE
KETONES UR: NEGATIVE mg/dL
NITRITE: POSITIVE — AB
PH: 6 (ref 5.0–8.0)
PROTEIN: NEGATIVE mg/dL
Specific Gravity, Urine: 1.017 (ref 1.005–1.030)

## 2015-10-11 LAB — CBC WITH DIFFERENTIAL/PLATELET
BASOS ABS: 0 10*3/uL (ref 0.0–0.1)
BASOS PCT: 0 %
Eosinophils Absolute: 0 10*3/uL (ref 0.0–0.7)
Eosinophils Relative: 1 %
HEMATOCRIT: 35 % — AB (ref 36.0–46.0)
HEMOGLOBIN: 10.8 g/dL — AB (ref 12.0–15.0)
Lymphocytes Relative: 27 %
Lymphs Abs: 1.1 10*3/uL (ref 0.7–4.0)
MCH: 25.2 pg — ABNORMAL LOW (ref 26.0–34.0)
MCHC: 30.9 g/dL (ref 30.0–36.0)
MCV: 81.6 fL (ref 78.0–100.0)
Monocytes Absolute: 0.2 10*3/uL (ref 0.1–1.0)
Monocytes Relative: 5 %
NEUTROS ABS: 2.7 10*3/uL (ref 1.7–7.7)
NEUTROS PCT: 67 %
Platelets: 176 10*3/uL (ref 150–400)
RBC: 4.29 MIL/uL (ref 3.87–5.11)
RDW: 16.7 % — ABNORMAL HIGH (ref 11.5–15.5)
WBC: 4.1 10*3/uL (ref 4.0–10.5)

## 2015-10-11 LAB — URINE MICROSCOPIC-ADD ON

## 2015-10-11 MED ORDER — SODIUM CHLORIDE 0.9 % IV SOLN
1000.0000 mg | Freq: Once | INTRAVENOUS | Status: AC
  Administered 2015-10-11: 1000 mg via INTRAVENOUS
  Filled 2015-10-11 (×2): qty 10

## 2015-10-11 MED ORDER — CEPHALEXIN 500 MG PO CAPS: 500.0000 mg | ORAL_CAPSULE | Freq: Two times a day (BID) | ORAL | 0 refills | Status: DC

## 2015-10-11 NOTE — ED Notes (Signed)
Attempted report to facility, no answer

## 2015-10-11 NOTE — ED Notes (Signed)
Attempted report 2x to [facility].

## 2015-10-11 NOTE — Discharge Instructions (Signed)

## 2015-10-11 NOTE — ED Provider Notes (Cosign Needed)
[facility]-EMERGENCY DEPT Provider Note   CSN: [id number] Arrival date & time: 10/11/15  1252  First Provider Contact:  First MD Initiated Contact with Patient 10/11/15 1337        History   Chief Complaint Chief Complaint  Patient presents with  . Seizures   Level 5 caveat, pt non-verbal  HPI Rebecca [name] is a 70 y.o. female.  HPI   Patient is a 70 year old female with history of stroke, there is disease, especially with aphasia, hypertension, A. fib, she presents to emergency department via EMS after patient had a unknown duration, whole body seizure, which caused her to fall out of her wheelchair and hit her head.  She was assisted back in her wheelchair and was reportedly back at baseline.  Nursing staff was unable to be contacted regarding loss of consciousness or postictal period.  At baseline she is in the skilled nursing facility, wheelchair-bound, non-verbal.  EMS report stated patient has a history of seizures and she is on Keppra although this is not found and her paperwork. Her son was contacted via phone and states that she "slumped over" 2 months ago, he believes that she may have seizure disorder however he is not aware of any established care with neurology, and he did not know much about her medical history.    Past Medical History:  Diagnosis Date  . Atrial fibrillation (HCC) 05/01/2013  . Depression   . Diabetes mellitus without complication (HCC)   . Expressive aphasia 03/23/2013  . 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 Fabry's disease (HCC)   . Hypertension   . Stroke (HCC)    2015  . Stroke [facility]) 2016   aphasia  . Tobacco abuse 05/01/2013    Patient Active Problem List   Diagnosis Date Noted  . 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 Systolic and diastolic CHF, chronic (HCC) 07/26/2015  . Anemia, chronic disease 07/23/2015  . Hematochezia 07/23/2015  . Lower GI bleed 07/18/2015  . Late effects of CVA (cerebrovascular accident) 07/06/2015  . UTI (urinary  tract infection) 04/18/2015  . E. coli UTI 04/05/2015  . Angioedema of lips from Lisinopril 03/22/2015  . 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 Dysphagia S/P CVA (cerebrovascular accident) 04/22/2014  . 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 Global aphasia   . Paroxysmal atrial fibrillation (HCC)   . Hyperlipidemia   . Acute on chronic combined systolic and diastolic CHF (congestive heart failure) (HCC)   . Pulmonary hypertension (HCC)     Past Surgical History:  Procedure Laterality Date  . CESAREAN SECTION     x3  . hip replacement Right 1990's  . hip replacemet Left 1990's  . LAPAROSCOPIC GASTROSTOMY N/A 04/20/2014   Procedure: LAPAROSCOPIC GASTROSTOMY TUBE PLACEMENT;  Surgeon: Axel Filler, MD;  Location: [facility] OR;  Service: General;  Laterality: N/A;  . PEG tube placemnt  04/15/14  . TEE WITHOUT CARDIOVERSION N/A 03/26/2013   Procedure: TRANSESOPHAGEAL ECHOCARDIOGRAM (TEE);  Surgeon: Thurmon Fair, MD;  Location[facility] ENDOSCOPY;  Service: Cardiovascular;  Laterality: N/A;  . TOOTH EXTRACTION Right 1/17   lower canine    OB History    No data available       Home Medications    Prior to Admission medications   Medication Sig Start Date End Date Taking? Authorizing Provider  apixaban (ELIQUIS) 5 MG TABS tablet Take 5 mg by mouth 2 (two) times daily.  Yes Historical Provider, MD  atorvastatin (LIPITOR) 40 MG tablet Take 1 tablet (40 mg total) by mouth daily at 6 PM. 03/27/13  Yes Myra Rude, MD  metoprolol tartrate (LOPRESSOR) 25 MG tablet Take 0.5 tablets (12.5 mg total) by mouth 2 (two) times daily. 04/07/14  Yes Drema Dallas, MD  Multiple Vitamin (MULTI VITAMIN DAILY PO) Take 1 tablet by mouth daily.   Yes Historical Provider, MD  Nutritional Supplements (NUTRITIONAL SUPPLEMENT PO) Take 120 mLs by mouth 2 (two) times daily. Med Pass,  Mech soft texture, regular consistency   Yes Historical Provider, MD  polyethylene glycol (MIRALAX / GLYCOLAX) packet Take 17 g by mouth daily.   Yes Historical Provider, MD  sertraline (ZOLOFT) 50 MG tablet Take 50 mg by mouth daily.   Yes Historical Provider, MD  Bismuth Tribromoph-Petrolatum (XEROFORM PETROLAT GAUZE 1"X8" EX) Apply topically. Apply to 2nd toe of Right foot topically every day shift every 3 days    Historical Provider, MD    Family History Family History  Problem Relation Age of Onset  . Alcohol abuse Maternal Aunt     Social History Social History  Substance Use Topics  . Smoking status: Never Smoker  . Smokeless tobacco: Not on file  . Alcohol use No     Allergies   Lisinopril   Review of Systems Review of Systems  Unable to perform ROS: Patient nonverbal     Physical Exam Updated Vital Signs BP 141/95   Pulse (!) 58   Temp 98.4 F (36.9 C)   Resp 15   Wt 57.2 kg   SpO2 100%   BMI 21.63 kg/m   Physical Exam  HENT:  Head: Head is with contusion. Head is without Battle's sign and without laceration.  Right Ear: External ear normal.  Left Ear: External ear normal.  Nose: No nose lacerations, sinus tenderness or nasal deformity. No epistaxis. Right sinus exhibits maxillary sinus tenderness and frontal sinus tenderness. Left sinus exhibits no maxillary sinus tenderness and no frontal sinus tenderness.  Mouth/Throat: Oropharynx is clear and moist.  Eyes: Pupils are equal, round, and reactive to light. Right eye exhibits no discharge. Left eye exhibits no discharge. Right conjunctiva is injected.  Large hematoma to right upper lid and right lower lid with bruising, swelling, erythema and ttp.  Right lateral conjuctiva erythematous Not able to evaluate EOM's  Neck: No spinous process tenderness and no muscular tenderness present. No erythema present.  Pulmonary/Chest: No stridor.  Musculoskeletal: She exhibits no deformity.  Nursing note and vitals  reviewed.    ED Treatments / Results  Labs (all labs ordered are listed, but only abnormal results are displayed) Labs Reviewed  BASIC METABOLIC PANEL - Abnormal; Notable for the following:       Result Value   Glucose, Bld 112 (*)    All other components within normal limits  CBC WITH DIFFERENTIAL/PLATELET - Abnormal; Notable for the following:    Hemoglobin 10.8 (*)    HCT 35.0 (*)    MCH 25.2 (*)    RDW 16.7 (*)    All other components within normal limits  URINE CULTURE  URINALYSIS, ROUTINE W REFLEX MICROSCOPIC (NOT AT [facility])    EKG  EKG Interpretation  Date/Time:  Monday October 11 2015 13:03:23 EDT Ventricular Rate:  67 PR Interval:    QRS Duration: 119 QT Interval:  452 QTC Calculation: 478 R Axis:   -128 Text Interpretation:  Sinus rhythm Prolonged PR interval Nonspecific intraventricular conduction delay  Anterior infarct, old No significant change since last tracing Confirmed by [facility]  MD, WHITNEY ([id number]) on 10/11/2015 1:11:04 PM       Radiology Ct Head Wo Contrast  Result Date: 10/11/2015 CLINICAL DATA:  70 year old diabetic hypertensive female with atrial fibrillation had seizure today and fell out of chair. Struck head. Unsure if loss of consciousness. Initial encounter. EXAM: CT HEAD WITHOUT CONTRAST TECHNIQUE: Contiguous axial images were obtained from the base of the skull through the vertex without intravenous contrast. COMPARISON:  04/02/2014 brain MR.  04/01/2014 head CT. FINDINGS: Soft tissue swelling right pre septal position without underlying fracture. The globes appear to be grossly intact post lens replacement. No intracranial hemorrhage. Remote moderate to large size left frontal lobe infarct. Remote right occipital lobe infarct. Marked chronic microvascular changes. Remote small right cerebellar infarct. No CT evidence of large acute infarct. Moderate global atrophy without hydrocephalus. No intracranial mass lesion noted on this unenhanced exam.  Vascular calcifications. Polypoid opacification right maxillary sinus. IMPRESSION: Soft tissue swelling right pre septal position without underlying fracture. The globes appear to be grossly intact post lens replacement. No intracranial hemorrhage. Remote moderate to large size left frontal lobe infarct. Remote right occipital lobe infarct. Marked chronic microvascular changes. Remote small right cerebellar infarct. No CT evidence of large acute infarct. Moderate global atrophy without hydrocephalus. Electronically Signed   By: Lacy Duverney M.D.   On: 10/11/2015 16:29   Procedures Procedures (including critical care time)  Medications Ordered in ED Medications  levETIRAcetam (KEPPRA) 1,000 mg in sodium chloride 0.9 % 100 mL IVPB (0 mg Intravenous Stopped 10/11/15 1454)     Initial Impression / Assessment and Plan / ED Course  I have reviewed the triage vital signs and the nursing notes.  Pertinent labs & imaging results that were available during my care of the patient were reviewed by me and considered in my medical decision making (see chart for details).  Clinical Course   Pt comes in from nursing home, reported seizure with subsequent fall with facial contusion, hematoma around right eye.  Unclear history and errors communicated from nursing home.  It was initially reported the patient has a history of seizures and is treated with Keppra however accompanying paperwork does not document this. I called patient's son to clarify her medical history, he states that 2 months ago she had a "slumping over episode" and was taken away for evaluation however he does not know if she is established with neurology or if she is treated for any seizure disorders.  BASIC labs obtained, urinalysis, EKG, head CT obtained, urinalysis pending because of delayed in and out catheterization.  On exam patient would follow some commands, for example squeeze my hand when asked to, but when asked to enter questions  with nodding yes or no or squeezing hand once or twice she would not.    Head CT and urinalysis pending, she was given to Dr. Bebe Shaggy at shift change for disposition.  After this time staff at the patient's nursing home called to clarify history, stated that the patient does not have a history of seizures and is not on keppra.  SHe was given 1 g keppra as a loading dose, per recommendations of Dr. Anitra Lauth.  Final Clinical Impressions(s) / ED Diagnoses   Final diagnoses:  None    New Prescriptions New Prescriptions   No medications on file     Danelle Berry, Cordelia Poche 10/15/15 1904

## 2015-10-11 NOTE — ED Provider Notes (Signed)
I assumed care at signout to f/u on CT head If negative, will d/c back to facility Pt resting comfortably, no distress Will call son back once results are complete   Zadie Rhine, MD 10/11/15 1631

## 2015-10-11 NOTE — ED Provider Notes (Signed)
uti noted Pt stable I spoke to son and let him know about CT head and it was negative    Zadie Rhine, MD 10/11/15 1752

## 2015-10-11 NOTE — ED Triage Notes (Signed)
Pt. [facility] via [facility] for seizure and fall. Pt. Facility reports that patient had a full body seizure for an unknown period of time and fell out of her wheel chair and hit her head during. Pt. Back at baseline per facility when EMS arrived. Pt. Hx of stroke and is nonverbal. Pt. Will nod yes or no to some questions. Pt. Noted to have hematoma/swelling to right eye area. Pt. Nurse at facility sts that she is on kepra for seizures, but no mention of such in any paperwork from facility.

## 2015-10-14 LAB — URINE CULTURE: Culture: >=100000 — AB

## 2015-10-15 ENCOUNTER — Encounter (HOSPITAL_COMMUNITY): Payer: Self-pay

## 2015-10-15 ENCOUNTER — Telehealth (HOSPITAL_BASED_OUTPATIENT_CLINIC_OR_DEPARTMENT_OTHER): Payer: Self-pay

## 2015-10-15 ENCOUNTER — Encounter (HOSPITAL_COMMUNITY)
Admission: RE | Admit: 2015-10-15 | Discharge: 2015-10-15 | Disposition: A | Discharge status: Home / Self Care | Payer: Medicare Other | Source: Ambulatory Visit | Attending: Nephrology | Admitting: Nephrology

## 2015-10-15 DIAGNOSIS — F17200 Nicotine dependence, unspecified, uncomplicated: Secondary | ICD-10-CM | POA: Diagnosis not present

## 2015-10-15 DIAGNOSIS — R944 Abnormal results of kidney function studies: Secondary | ICD-10-CM | POA: Insufficient documentation

## 2015-10-15 DIAGNOSIS — E779 Disorder of glycoprotein metabolism, unspecified: Secondary | ICD-10-CM | POA: Diagnosis present

## 2015-10-15 DIAGNOSIS — I4891 Unspecified atrial fibrillation: Secondary | ICD-10-CM | POA: Diagnosis not present

## 2015-10-15 DIAGNOSIS — I10 Essential (primary) hypertension: Secondary | ICD-10-CM | POA: Diagnosis not present

## 2015-10-15 DIAGNOSIS — R4701 Aphasia: Secondary | ICD-10-CM | POA: Diagnosis not present

## 2015-10-15 DIAGNOSIS — E119 Type 2 diabetes mellitus without complications: Secondary | ICD-10-CM | POA: Diagnosis not present

## 2015-10-15 LAB — COMPREHENSIVE METABOLIC PANEL
ALBUMIN: 4.1 g/dL (ref 3.5–5.0)
ALK PHOS: 33 U/L — AB (ref 38–126)
ALT: 16 U/L (ref 14–54)
ANION GAP: 7 (ref 5–15)
AST: 31 U/L (ref 15–41)
BILIRUBIN TOTAL: 1.2 mg/dL (ref 0.3–1.2)
BUN: 31 mg/dL — AB (ref 6–20)
CALCIUM: 9.2 mg/dL (ref 8.9–10.3)
CO2: 24 mmol/L (ref 22–32)
Chloride: 113 mmol/L — ABNORMAL HIGH (ref 101–111)
Creatinine, Ser: 0.97 mg/dL (ref 0.44–1.00)
GFR calc Af Amer: >60 mL/min (ref >60)
GFR calc non Af Amer: 57 mL/min — ABNORMAL LOW (ref >60)
GLUCOSE: 108 mg/dL — AB (ref 65–99)
Potassium: 4 mmol/L (ref 3.5–5.1)
Sodium: 144 mmol/L (ref 135–145)
TOTAL PROTEIN: 8.5 g/dL — AB (ref 6.5–8.1)

## 2015-10-15 MED ORDER — SODIUM CHLORIDE 0.9 % IV SOLN
INTRAVENOUS | Status: DC
  Administered 2015-10-15: 09:00:00 via INTRAVENOUS

## 2015-10-15 MED ORDER — ACETAMINOPHEN 500 MG PO TABS
500.0000 mg | ORAL_TABLET | Freq: Once | ORAL | Status: AC
  Administered 2015-10-15: 500 mg via ORAL
  Filled 2015-10-15: qty 1

## 2015-10-15 MED ORDER — SODIUM CHLORIDE 0.9 % IV SOLN
60.0000 mg | INTRAVENOUS | Status: DC
  Administered 2015-10-15: 60 mg via INTRAVENOUS
  Filled 2015-10-15: qty 7

## 2015-10-15 NOTE — Telephone Encounter (Signed)
UC returned. No treatment needed per Unice Cobble D

## 2015-10-15 NOTE — Progress Notes (Addendum)
Called [facility] and notified them pt was ready to return back to the facility.  They stated they would notify their driver Ajul to come get her.

## 2015-10-15 NOTE — Progress Notes (Signed)
ED Antimicrobial Stewardship Positive Culture Follow Up   Rebecca [name] is an 70 y.o. female who presented to [facility] on 10/15/2015 with a chief complaint of No chief complaint on file.   Recent Results (from the past 720 hour(s))  Urine culture     Status: Abnormal   Collection Time: 10/11/15  4:56 PM  Result Value Ref Range Status   Specimen Description URINE, CATHETERIZED  Final   Special Requests NONE  Final   Culture (A)  Final    >=100,000 COLONIES/mL ESCHERICHIA COLI Confirmed Extended Spectrum Beta-Lactamase Producer (ESBL)    Report Status 10/14/2015 FINAL  Final   Organism ID, Bacteria ESCHERICHIA COLI (A)  Final      Susceptibility   Escherichia coli - MIC*    AMPICILLIN >=32 RESISTANT Resistant     CEFAZOLIN >=64 RESISTANT Resistant     CEFTRIAXONE >=64 RESISTANT Resistant     CIPROFLOXACIN >=4 RESISTANT Resistant     GENTAMICIN <=1 SENSITIVE Sensitive     IMIPENEM <=0.25 SENSITIVE Sensitive     NITROFURANTOIN <=16 SENSITIVE Sensitive     TRIMETH/SULFA <=20 SENSITIVE Sensitive     AMPICILLIN/SULBACTAM >=32 RESISTANT Resistant     PIP/TAZO 64 INTERMEDIATE Intermediate     * >=100,000 COLONIES/mL ESCHERICHIA COLI    Pt without symptoms of a UTI and no pyuria. No further treatment indicated.  ED Provider: Ebbie Ridge, PA-C  Angelene Rome L. Roseanne Reno, PharmD Infectious Diseases Clinical Pharmacist Pager: [phone] 10/15/2015 10:17 AM

## 2015-10-29 ENCOUNTER — Encounter (HOSPITAL_COMMUNITY): Payer: Self-pay

## 2015-10-29 ENCOUNTER — Encounter (HOSPITAL_COMMUNITY)
Admission: RE | Admit: 2015-10-29 | Discharge: 2015-10-29 | Disposition: A | Discharge status: Home / Self Care | Payer: Medicare Other | Source: Ambulatory Visit | Attending: Nephrology | Admitting: Nephrology

## 2015-10-29 DIAGNOSIS — R944 Abnormal results of kidney function studies: Secondary | ICD-10-CM | POA: Insufficient documentation

## 2015-10-29 DIAGNOSIS — E119 Type 2 diabetes mellitus without complications: Secondary | ICD-10-CM | POA: Insufficient documentation

## 2015-10-29 DIAGNOSIS — F17200 Nicotine dependence, unspecified, uncomplicated: Secondary | ICD-10-CM | POA: Insufficient documentation

## 2015-10-29 DIAGNOSIS — E779 Disorder of glycoprotein metabolism, unspecified: Secondary | ICD-10-CM | POA: Diagnosis not present

## 2015-10-29 DIAGNOSIS — I10 Essential (primary) hypertension: Secondary | ICD-10-CM | POA: Insufficient documentation

## 2015-10-29 DIAGNOSIS — I4891 Unspecified atrial fibrillation: Secondary | ICD-10-CM | POA: Insufficient documentation

## 2015-10-29 DIAGNOSIS — R4701 Aphasia: Secondary | ICD-10-CM | POA: Diagnosis not present

## 2015-10-29 MED ORDER — SODIUM CHLORIDE 0.9 % IV SOLN
INTRAVENOUS | Status: DC
  Administered 2015-10-29: 09:00:00 via INTRAVENOUS

## 2015-10-29 MED ORDER — SODIUM CHLORIDE 0.9 % IV SOLN
1.0000 mg/kg | INTRAVENOUS | Status: DC
  Filled 2015-10-29: qty 13

## 2015-10-29 MED ORDER — ACETAMINOPHEN 500 MG PO TABS
500.0000 mg | ORAL_TABLET | ORAL | Status: DC
  Filled 2015-10-29: qty 1

## 2015-10-29 MED ORDER — SODIUM CHLORIDE 0.9 % IV SOLN
1.0000 mg/kg | INTRAVENOUS | Status: DC
  Administered 2015-10-29: 60 mg via INTRAVENOUS
  Filled 2015-10-29: qty 7

## 2015-11-02 ENCOUNTER — Encounter: Payer: Self-pay | Admitting: Adult Health

## 2015-11-02 ENCOUNTER — Non-Acute Institutional Stay (SKILLED_NURSING_FACILITY): Payer: Medicare Other | Admitting: Adult Health

## 2015-11-02 DIAGNOSIS — E785 Hyperlipidemia, unspecified: Secondary | ICD-10-CM

## 2015-11-02 DIAGNOSIS — I6990 Unspecified sequelae of unspecified cerebrovascular disease: Secondary | ICD-10-CM

## 2015-11-02 DIAGNOSIS — I69391 Dysphagia following cerebral infarction: Secondary | ICD-10-CM | POA: Diagnosis not present

## 2015-11-02 DIAGNOSIS — I272 Other secondary pulmonary hypertension: Secondary | ICD-10-CM | POA: Diagnosis not present

## 2015-11-02 DIAGNOSIS — I5042 Chronic combined systolic (congestive) and diastolic (congestive) heart failure: Secondary | ICD-10-CM

## 2015-11-02 DIAGNOSIS — E7521 Fabry (-Anderson) disease: Secondary | ICD-10-CM | POA: Diagnosis not present

## 2015-11-02 DIAGNOSIS — D638 Anemia in other chronic diseases classified elsewhere: Secondary | ICD-10-CM

## 2015-11-02 DIAGNOSIS — I480 Paroxysmal atrial fibrillation: Secondary | ICD-10-CM

## 2015-11-02 DIAGNOSIS — E1149 Type 2 diabetes mellitus with other diabetic neurological complication: Secondary | ICD-10-CM | POA: Diagnosis not present

## 2015-11-02 DIAGNOSIS — I119 Hypertensive heart disease without heart failure: Secondary | ICD-10-CM | POA: Diagnosis not present

## 2015-11-02 NOTE — Progress Notes (Signed)
Patient ID: Shellia CarwinConstance E Polhemus, female   DOB: 06/09/1945, 70 y.o.   MRN: [id number]   Location:   [facility] Room Number: 111-A Place of Service:  SNF (31)   CODE STATUS: Full Code  Allergies  Allergen Reactions  . Lisinopril Swelling    Chief Complaint  Patient presents with  . Medical Management of Chronic Issues    Follow up    HPI:  She is a long term resident of this facility being seen for the management of her chronic illnesses. Overall there is little change in her status. She does get out of bed on a daily basis. She is unable to fully participate in the hpi or ros. There are no nursing concerns at this time.    Past Medical History:  Diagnosis Date  . Atrial fibrillation (HCC) 05/01/2013  . Depression   . Diabetes mellitus without complication (HCC)   . Expressive aphasia 03/23/2013  . 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 Fabry's disease (HCC)   . Hypertension   . Stroke (HCC)    2015  . Stroke [facility](HCC) 2016   aphasia  . Tobacco abuse 05/01/2013    Past Surgical History:  Procedure Laterality Date  . CESAREAN SECTION     x3  . hip replacement Right 1990's  . hip replacemet Left 1990's  . LAPAROSCOPIC GASTROSTOMY N/A 04/20/2014   Procedure: LAPAROSCOPIC GASTROSTOMY TUBE PLACEMENT;  Surgeon: Axel FillerArmando Ramirez, MD;  Location: [facility] OR;  Service: General;  Laterality: N/A;  . PEG tube placemnt  04/15/14  . TEE WITHOUT CARDIOVERSION N/A 03/26/2013   Procedure: TRANSESOPHAGEAL ECHOCARDIOGRAM (TEE);  Surgeon: Thurmon FairMihai Croitoru, MD;  [facility];  Service: Cardiovascular;  Laterality: N/A;  . TOOTH EXTRACTION Right 1/17   lower canine    Social History   Social History  . Marital status: Single    Spouse name: N/A  . Number of children: N/A  . Years of education: N/A   Occupational History  . Not on file.   Social History Main Topics  . Smoking status: Never Smoker  . Smokeless tobacco: Never Used  . Alcohol use No  . Drug use: No  . Sexual  activity: Not Currently   Other Topics Concern  . Not on file   Social History Narrative  . No narrative on file   Family History  Problem Relation Age of Onset  . Alcohol abuse Maternal Aunt       VITAL SIGNS BP 131/77   Pulse 66   Temp 97.8 F (36.6 C) (Oral)   Resp 18   Ht 5\' 4"  (1.626 m)   Wt 137 lb (62.1 kg)   SpO2 98%   BMI 23.52 kg/m   Patient's Medications  New Prescriptions   No medications on file  Previous Medications   APIXABAN (ELIQUIS) 5 MG TABS TABLET    Take 5 mg by mouth 2 (two) times daily.    ATORVASTATIN (LIPITOR) 40 MG TABLET    Take 1 tablet (40 mg total) by mouth daily at 6 PM.   METOPROLOL TARTRATE (LOPRESSOR) 25 MG TABLET    Take 0.5 tablets (12.5 mg total) by mouth 2 (two) times daily.   MULTIPLE VITAMIN (MULTI VITAMIN DAILY PO)    Take 1 tablet by mouth daily.   NUTRITIONAL SUPPLEMENTS (NUTRITIONAL SUPPLEMENT PO)    Take 120 mLs by mouth 2 (two) times daily. Med Pass, Mech soft texture, regular consistency   POLYETHYLENE GLYCOL (MIRALAX / GLYCOLAX) PACKET  Take 17 g by mouth daily.   SERTRALINE (ZOLOFT) 50 MG TABLET    Take 50 mg by mouth daily.  Modified Medications   No medications on file  Discontinued Medications   BISMUTH TRIBROMOPH-PETROLATUM (XEROFORM PETROLAT GAUZE 1"X8" EX)    Apply topically. Apply to 2nd toe of Right foot topically every day shift every 3 days   CEPHALEXIN (KEFLEX) 500 MG CAPSULE    Take 1 capsule (500 mg total) by mouth 2 (two) times daily.     SIGNIFICANT DIAGNOSTIC EXAMS  10-30-14: wbc 3.5; hgb 10.6; hct 35.2; mcv 86.8; plt 196; glucose 77; bun 21.5; creat 0.72; k+4.1; na++142; liver normal albumin 3.1 chol 143; ldl 73; trig 46; hdl 61 11-04-14: wbc 4.0; hgb 11.6; hct 40.7; mcv 89.9; plt 174 12-22-14: glucose 90; bun 25.2; creat 0.80; k+ 4.2; na++ 144  01-06-15: hgb a1c 5.7  01-12-15: peg tube site: MRSA: septra ds  03-10-15: chol 129; ldl 53; trig 60; hdl 64; urine micro-albumin 2.0  03-30-15: wbc 4.7; hgb  12.9; hct 40.6; mcv 85.4; plt 148; glucose 97; bun 41; creat 1.17; k+ 4.3; na++142  blood culture: contaminant  04-01-15: urine culture: e-coli: cipro  05-05-15: hgb a1c 5.8  08-28-15: wbc 3.1; hgb 10.1; hct 34.9; mcv 86.5; plt 194; glucose 94; bun 20.9; creat 0.76; k+ 3.9; na++ 144; liver normal albumin 3.6; hgb a1c 5.5 09-06-15: wbc 3.2; hgb 11.2; hct 39.7; mcv 85.7 plt 199  10-02-15: chol 141; ldl 66; trig 60; hdl 59; urine micro-albumin 6.6        Review of Systems Unable to perform ROS: Patient nonverbal    Physical Exam Constitutional: No distress.  Eyes: Conjunctivae are normal.  Neck: Neck supple. No JVD present. No thyromegaly present.  Cardiovascular: Normal rate, regular rhythm and intact distal pulses.   Respiratory: Effort normal and breath sounds normal. No respiratory distress. She has no wheezes.  GI: Soft. Bowel sounds are normal. She exhibits no distension. There is no tenderness.   Musculoskeletal: She exhibits no edema. Is able to move all extremities  Lymphadenopathy:    She has no cervical adenopathy.  Neurological: She is alert.  Skin: Skin is warm and dry. She is not diaphoretic.  Psychiatric: does engage .     ASSESSMENT/ PLAN:  1. 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 Dysphagia: no signs of aspiration present;   will continue on thin liquids with regular diet and will monitor  Her peg tube is out.   3. Afib: heart rate is regular; will continue lopressor 12.5 mg twice daily for rate control; will continue eliquis 5 mg twice daily   4. Dyslipidemia: will continue lipitor 40 mg daily  ldl is 66  5. Hypertension: will continue lopressor 12.5 mg twice daily will continue  clonidine patch  0.2 mg weekly the lisinopril was stopped due to angioedema   6. Depression: is presently stable will continue zoloft 50 mg daily   7. Allergic rhinitis: will continue claritin 10 mg daily   8. 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 Diabetes: is presently not on medications; her hgb a1c is 5.5. She is on  statin and asa; urine micro-albumin 6.6  10. Chronic systolic and diastolic heart failure: has pulmonary hypertension is presently stable; will not make changes and will monitor her status.      Gavin Pound  Johnothan Bascomb NP [facility]  Contact [phone] Monday through Friday 8am- 5pm  After hours call [phone]

## 2015-11-12 ENCOUNTER — Encounter (HOSPITAL_COMMUNITY)
Admission: RE | Admit: 2015-11-12 | Discharge: 2015-11-12 | Disposition: A | Discharge status: Home / Self Care | Payer: Medicare Other | Source: Ambulatory Visit | Attending: Nephrology | Admitting: Nephrology

## 2015-11-12 ENCOUNTER — Encounter (HOSPITAL_COMMUNITY): Payer: Self-pay

## 2015-11-12 DIAGNOSIS — E779 Disorder of glycoprotein metabolism, unspecified: Secondary | ICD-10-CM | POA: Diagnosis not present

## 2015-11-12 LAB — CBC
HEMATOCRIT: 32.5 % — AB (ref 36.0–46.0)
Hemoglobin: 10.4 g/dL — ABNORMAL LOW (ref 12.0–15.0)
MCH: 25.6 pg — ABNORMAL LOW (ref 26.0–34.0)
MCHC: 32 g/dL (ref 30.0–36.0)
MCV: 80 fL (ref 78.0–100.0)
Platelets: 223 10*3/uL (ref 150–400)
RBC: 4.06 MIL/uL (ref 3.87–5.11)
RDW: 19.5 % — AB (ref 11.5–15.5)
WBC: 4.3 10*3/uL (ref 4.0–10.5)

## 2015-11-12 LAB — COMPREHENSIVE METABOLIC PANEL
ALBUMIN: 3.4 g/dL — AB (ref 3.5–5.0)
ALT: 11 U/L — ABNORMAL LOW (ref 14–54)
ANION GAP: 5 (ref 5–15)
AST: 27 U/L (ref 15–41)
Alkaline Phosphatase: 37 U/L — ABNORMAL LOW (ref 38–126)
BILIRUBIN TOTAL: 0.5 mg/dL (ref 0.3–1.2)
BUN: 20 mg/dL (ref 6–20)
CO2: 25 mmol/L (ref 22–32)
Calcium: 8.9 mg/dL (ref 8.9–10.3)
Chloride: 112 mmol/L — ABNORMAL HIGH (ref 101–111)
Creatinine, Ser: 0.97 mg/dL (ref 0.44–1.00)
GFR calc Af Amer: >60 mL/min (ref >60)
GFR, EST NON AFRICAN AMERICAN: 57 mL/min — AB (ref >60)
Glucose, Bld: 101 mg/dL — ABNORMAL HIGH (ref 65–99)
POTASSIUM: 3.8 mmol/L (ref 3.5–5.1)
Sodium: 142 mmol/L (ref 135–145)
TOTAL PROTEIN: 7.7 g/dL (ref 6.5–8.1)

## 2015-11-12 MED ORDER — ACETAMINOPHEN 160 MG/5ML PO SOLN
500.0000 mg | ORAL | Status: DC
  Administered 2015-11-12: 500 mg via ORAL
  Filled 2015-11-12: qty 20.3

## 2015-11-12 MED ORDER — SODIUM CHLORIDE 0.9 % IV SOLN
1.0000 mg/kg | INTRAVENOUS | Status: DC
  Administered 2015-11-12: 60 mg via INTRAVENOUS
  Filled 2015-11-12: qty 7

## 2015-11-12 MED ORDER — SODIUM CHLORIDE 0.9 % IV SOLN
INTRAVENOUS | Status: DC
  Administered 2015-11-12: 09:00:00 via INTRAVENOUS

## 2015-11-12 NOTE — Progress Notes (Signed)
Pt arrives today via w/c and transport form [employer]. Pt is alert and animated. Smiling at staff and states her name and birthdate. She verbalizes she is cold but with her aphasia usually will nod or shake her head to questions. Pt assisted to stretcher where she sat up in bed and swallowed Tylenol Elixer and few sips of water. Then she was reclined back and immediately went to sleep snoring at intervals

## 2015-11-12 NOTE — Progress Notes (Signed)
Uneventful infusion of Fabrazyme with labs today. Pt arrived with an dry adult diaper in place. She has not voided but had moderate amt of soft brown stool and fresh diaper was applied. Pt is ready for discharge back to [facility] and transportation has been called. She will be assisted to her wheelchair for transport

## 2015-11-23 ENCOUNTER — Encounter: Payer: Self-pay | Admitting: Adult Health

## 2015-11-23 ENCOUNTER — Non-Acute Institutional Stay (SKILLED_NURSING_FACILITY): Payer: Medicare Other | Admitting: Adult Health

## 2015-11-23 DIAGNOSIS — R630 Anorexia: Secondary | ICD-10-CM

## 2015-11-23 DIAGNOSIS — J189 Pneumonia, unspecified organism: Secondary | ICD-10-CM

## 2015-11-23 NOTE — Progress Notes (Signed)
Patient ID: Rebecca [name], female   DOB: 06-04-1945, 70 y.o.   MRN: [id number]    Location:   [facility] Room Number: 111-A Place of Service:  SNF (31)   CODE STATUS: Full Code  Allergies  Allergen Reactions  . Lisinopril Swelling    Chief Complaint  Patient presents with  . Acute Visit     her status      HPI:  She was seen by neurology and was diagnosed with pneumonia. She was started on doxycycline. Staff reports that she is not taking her abt; that her appetite is poor and that she is more withdrawn. She is unable to participate in the hpi or ros. There are no reports of fevers present.    Past Medical History:  Diagnosis Date  . Atrial fibrillation (HCC) 05/01/2013  . Depression   . Diabetes mellitus without complication (HCC)   . Expressive aphasia 03/23/2013  . 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 Fabry's disease (HCC)   . Hypertension   . Stroke (HCC)    2015  . Stroke [facility]) 2016   aphasia  . Tobacco abuse 05/01/2013    Past Surgical History:  Procedure Laterality Date  . CESAREAN SECTION     x3  . hip replacement Right 1990's  . hip replacemet Left 1990's  . LAPAROSCOPIC GASTROSTOMY N/A 04/20/2014   Procedure: LAPAROSCOPIC GASTROSTOMY TUBE PLACEMENT;  Surgeon: Axel Filler, MD;  Location: [facility] OR;  Service: General;  Laterality: N/A;  . PEG tube placemnt  04/15/14  . TEE WITHOUT CARDIOVERSION N/A 03/26/2013   Procedure: TRANSESOPHAGEAL ECHOCARDIOGRAM (TEE);  Surgeon: Thurmon Fair, MD;  Location: [facility] ENDOSCOPY;  Service: Cardiovascular;  Laterality: N/A;  . TOOTH EXTRACTION Right 1/17   lower canine    Social History   Social History  . Marital status: Single    Spouse name: N/A  . Number of children: N/A  . Years of education: N/A   Occupational History  . Not on file.   Social History Main Topics  . Smoking status: Never Smoker  . Smokeless tobacco: Never Used  . Alcohol use No  . Drug use: No  . Sexual activity: Not Currently     Other Topics Concern  . Not on file   Social History Narrative  . No narrative on file   Family History  Problem Relation Age of Onset  . Alcohol abuse Maternal Aunt       VITAL SIGNS BP (!) 136/93   Pulse 77   Temp 97 F (36.1 C) (Oral)   Resp 18   Ht 5\' 4"  (1.626 m)   Wt 138 lb 2 oz (62.7 kg)   SpO2 98%   BMI 23.71 kg/m   Patient's Medications  New Prescriptions   No medications on file  Previous Medications   APIXABAN (ELIQUIS) 5 MG TABS TABLET    Take 5 mg by mouth 2 (two) times daily.    ATORVASTATIN (LIPITOR) 40 MG TABLET    Take 1 tablet (40 mg total) by mouth daily at 6 PM.   DOXYCYCLINE (DORYX) 100 MG EC TABLET    Take 100 mg by mouth 2 (two) times daily.   METOPROLOL TARTRATE (LOPRESSOR) 25 MG TABLET    Take 0.5 tablets (12.5 mg total) by mouth 2 (two) times daily.   MULTIPLE VITAMIN (MULTI VITAMIN DAILY PO)    Take 1 tablet by mouth daily.   NUTRITIONAL SUPPLEMENTS (NUTRITIONAL SUPPLEMENT PO)    Take 120 mLs by mouth  2 (two) times daily. Med Pass, Puree texture, regular consistency   POLYETHYLENE GLYCOL (MIRALAX / GLYCOLAX) PACKET    Take 17 g by mouth daily.   SACCHAROMYCES BOULARDII (FLORASTOR) 250 MG CAPSULE    Take 250 mg by mouth 2 (two) times daily.   SERTRALINE (ZOLOFT) 50 MG TABLET    Take 50 mg by mouth daily.  Modified Medications   No medications on file  Discontinued Medications   No medications on file      SIGNIFICANT DIAGNOSTIC EXAMS  07-10-14: right neck ultrasound: no abnormality identified in the area of concern in the right submandibular region   11-27-14: chest x-ray: no acute cardiopulmonary process   03-31-15: chest x-ray: mild cardiomegaly with no acute infiltrate improved from 03-30-15   11-18-15: chest x-ray: mild right lobe/base infiltrate    LABS REVIEWED;   12-22-14: glucose 90; bun 25.2; creat 0.80; k+ 4.2; na++ 144  01-06-15: hgb a1c 5.7  01-12-15: peg tube site: MRSA: septra ds  03-10-15: chol 129; ldl 53; trig 60;  hdl 64; urine micro-albumin 2.0  03-30-15: wbc 4.7; hgb 12.9; hct 40.6; mcv 85.4; plt 148; glucose 97; bun 41; creat 1.17; k+ 4.3; na++142  blood culture: contaminant  04-01-15: urine culture: e-coli: cipro  05-05-15: hgb a1c 5.8  08-28-15: wbc 3.1; hgb 10.1; hct 34.9; mcv 86.5; plt 194; glucose 94; bun 20.9; creat 0.76; k+ 3.9; na++ 144; liver normal albumin 3.6; hgb a1c 5.5 09-06-15: wbc 3.2; hgb 11.2; hct 39.7; mcv 85.7 plt 199  10-02-15: chol 141; ldl 66; trig 60; hdl 59; urine micro-albumin 6.6    Review of Systems Unable to perform ROS: Patient nonverbal    Physical Exam Constitutional: No distress.  Eyes: Conjunctivae are normal.  Neck: Neck supple. No JVD present. No thyromegaly present.  Cardiovascular: Normal rate, regular rhythm and intact distal pulses.   Respiratory: Effort normal. No respiratory distress. She has no wheezes.  breath sounds diminished  GI: Soft. Bowel sounds are normal. She exhibits no distension. There is no tenderness.   Musculoskeletal: She exhibits no edema. Is able to move all extremities  Lymphadenopathy:    She has no cervical adenopathy.  Neurological: She is alert.  Skin: Skin is warm and dry. She is not diaphoretic.  Psychiatric: does engage .     ASSESSMENT/ PLAN:   1. Pneumonia: will stop the doxycycline will begin avelox 400 mg daily for 10 days   2. Anorexia: will start remeron 7.5 mg nightly for 30 days for her appetite and will monitor  3. Depression: will increase zoloft to 75 mg daily and will monitor      Synthia Innocenteborah Antwain Caliendo NP [facility]  Contact [phone] Monday through Friday 8am- 5pm  After hours call [phone]

## 2015-11-25 ENCOUNTER — Encounter (HOSPITAL_COMMUNITY): Payer: Self-pay | Admitting: Emergency Medicine

## 2015-11-25 ENCOUNTER — Emergency Department (HOSPITAL_COMMUNITY): Payer: Medicare Other

## 2015-11-25 ENCOUNTER — Inpatient Hospital Stay (HOSPITAL_COMMUNITY)
Admission: EM | Admit: 2015-11-25 | Discharge: 2015-11-30 | DRG: 064 | Disposition: A | Discharge status: Skilled Nursing Facility | Payer: Medicare Other | Attending: Internal Medicine | Admitting: Internal Medicine

## 2015-11-25 DIAGNOSIS — I110 Hypertensive heart disease with heart failure: Secondary | ICD-10-CM | POA: Diagnosis present

## 2015-11-25 DIAGNOSIS — G9340 Encephalopathy, unspecified: Secondary | ICD-10-CM | POA: Diagnosis present

## 2015-11-25 DIAGNOSIS — I480 Paroxysmal atrial fibrillation: Secondary | ICD-10-CM | POA: Diagnosis present

## 2015-11-25 DIAGNOSIS — R630 Anorexia: Secondary | ICD-10-CM

## 2015-11-25 DIAGNOSIS — R482 Apraxia: Secondary | ICD-10-CM | POA: Diagnosis not present

## 2015-11-25 DIAGNOSIS — Y95 Nosocomial condition: Secondary | ICD-10-CM | POA: Diagnosis present

## 2015-11-25 DIAGNOSIS — Z7189 Other specified counseling: Secondary | ICD-10-CM | POA: Diagnosis not present

## 2015-11-25 DIAGNOSIS — D638 Anemia in other chronic diseases classified elsewhere: Secondary | ICD-10-CM | POA: Diagnosis not present

## 2015-11-25 DIAGNOSIS — R7989 Other specified abnormal findings of blood chemistry: Secondary | ICD-10-CM

## 2015-11-25 DIAGNOSIS — I248 Other forms of acute ischemic heart disease: Secondary | ICD-10-CM | POA: Diagnosis present

## 2015-11-25 DIAGNOSIS — E46 Unspecified protein-calorie malnutrition: Secondary | ICD-10-CM | POA: Diagnosis present

## 2015-11-25 DIAGNOSIS — F0280 Dementia in other diseases classified elsewhere without behavioral disturbance: Secondary | ICD-10-CM | POA: Diagnosis present

## 2015-11-25 DIAGNOSIS — I69391 Dysphagia following cerebral infarction: Secondary | ICD-10-CM | POA: Diagnosis not present

## 2015-11-25 DIAGNOSIS — I080 Rheumatic disorders of both mitral and aortic valves: Secondary | ICD-10-CM | POA: Diagnosis present

## 2015-11-25 DIAGNOSIS — I69351 Hemiplegia and hemiparesis following cerebral infarction affecting right dominant side: Secondary | ICD-10-CM | POA: Diagnosis not present

## 2015-11-25 DIAGNOSIS — R627 Adult failure to thrive: Secondary | ICD-10-CM | POA: Diagnosis present

## 2015-11-25 DIAGNOSIS — E875 Hyperkalemia: Secondary | ICD-10-CM | POA: Diagnosis present

## 2015-11-25 DIAGNOSIS — E785 Hyperlipidemia, unspecified: Secondary | ICD-10-CM | POA: Diagnosis present

## 2015-11-25 DIAGNOSIS — Z515 Encounter for palliative care: Secondary | ICD-10-CM | POA: Diagnosis present

## 2015-11-25 DIAGNOSIS — I69390 Apraxia following cerebral infarction: Secondary | ICD-10-CM

## 2015-11-25 DIAGNOSIS — Z7401 Bed confinement status: Secondary | ICD-10-CM

## 2015-11-25 DIAGNOSIS — J9691 Respiratory failure, unspecified with hypoxia: Secondary | ICD-10-CM | POA: Diagnosis present

## 2015-11-25 DIAGNOSIS — I214 Non-ST elevation (NSTEMI) myocardial infarction: Secondary | ICD-10-CM

## 2015-11-25 DIAGNOSIS — J189 Pneumonia, unspecified organism: Secondary | ICD-10-CM | POA: Insufficient documentation

## 2015-11-25 DIAGNOSIS — I5042 Chronic combined systolic (congestive) and diastolic (congestive) heart failure: Secondary | ICD-10-CM | POA: Diagnosis present

## 2015-11-25 DIAGNOSIS — Z96641 Presence of right artificial hip joint: Secondary | ICD-10-CM | POA: Diagnosis present

## 2015-11-25 DIAGNOSIS — Z9109 Other allergy status, other than to drugs and biological substances: Secondary | ICD-10-CM

## 2015-11-25 DIAGNOSIS — R64 Cachexia: Secondary | ICD-10-CM | POA: Diagnosis present

## 2015-11-25 DIAGNOSIS — I119 Hypertensive heart disease without heart failure: Secondary | ICD-10-CM | POA: Diagnosis present

## 2015-11-25 DIAGNOSIS — D649 Anemia, unspecified: Secondary | ICD-10-CM | POA: Diagnosis present

## 2015-11-25 DIAGNOSIS — Z931 Gastrostomy status: Secondary | ICD-10-CM

## 2015-11-25 DIAGNOSIS — R4182 Altered mental status, unspecified: Secondary | ICD-10-CM | POA: Diagnosis present

## 2015-11-25 DIAGNOSIS — Z823 Family history of stroke: Secondary | ICD-10-CM

## 2015-11-25 DIAGNOSIS — R29726 NIHSS score 26: Secondary | ICD-10-CM | POA: Diagnosis present

## 2015-11-25 DIAGNOSIS — R4701 Aphasia: Secondary | ICD-10-CM | POA: Diagnosis present

## 2015-11-25 DIAGNOSIS — J440 Chronic obstructive pulmonary disease with acute lower respiratory infection: Secondary | ICD-10-CM | POA: Diagnosis present

## 2015-11-25 DIAGNOSIS — R001 Bradycardia, unspecified: Secondary | ICD-10-CM | POA: Diagnosis present

## 2015-11-25 DIAGNOSIS — I482 Chronic atrial fibrillation: Secondary | ICD-10-CM | POA: Diagnosis not present

## 2015-11-25 DIAGNOSIS — I63412 Cerebral infarction due to embolism of left middle cerebral artery: Secondary | ICD-10-CM | POA: Diagnosis present

## 2015-11-25 DIAGNOSIS — Z96643 Presence of artificial hip joint, bilateral: Secondary | ICD-10-CM | POA: Diagnosis present

## 2015-11-25 DIAGNOSIS — Z6823 Body mass index (BMI) 23.0-23.9, adult: Secondary | ICD-10-CM

## 2015-11-25 DIAGNOSIS — I472 Ventricular tachycardia: Secondary | ICD-10-CM | POA: Diagnosis not present

## 2015-11-25 DIAGNOSIS — E7521 Fabry (-Anderson) disease: Secondary | ICD-10-CM | POA: Diagnosis present

## 2015-11-25 DIAGNOSIS — Z79899 Other long term (current) drug therapy: Secondary | ICD-10-CM

## 2015-11-25 DIAGNOSIS — E872 Acidosis: Secondary | ICD-10-CM | POA: Diagnosis present

## 2015-11-25 DIAGNOSIS — Z888 Allergy status to other drugs, medicaments and biological substances status: Secondary | ICD-10-CM

## 2015-11-25 DIAGNOSIS — E1149 Type 2 diabetes mellitus with other diabetic neurological complication: Secondary | ICD-10-CM | POA: Diagnosis present

## 2015-11-25 DIAGNOSIS — R079 Chest pain, unspecified: Secondary | ICD-10-CM | POA: Diagnosis not present

## 2015-11-25 DIAGNOSIS — I69320 Aphasia following cerebral infarction: Secondary | ICD-10-CM

## 2015-11-25 DIAGNOSIS — R29810 Facial weakness: Secondary | ICD-10-CM | POA: Diagnosis present

## 2015-11-25 DIAGNOSIS — Z7901 Long term (current) use of anticoagulants: Secondary | ICD-10-CM

## 2015-11-25 DIAGNOSIS — I6340 Cerebral infarction due to embolism of unspecified cerebral artery: Secondary | ICD-10-CM | POA: Diagnosis present

## 2015-11-25 DIAGNOSIS — I639 Cerebral infarction, unspecified: Secondary | ICD-10-CM

## 2015-11-25 DIAGNOSIS — F329 Major depressive disorder, single episode, unspecified: Secondary | ICD-10-CM | POA: Diagnosis present

## 2015-11-25 DIAGNOSIS — Z87891 Personal history of nicotine dependence: Secondary | ICD-10-CM

## 2015-11-25 DIAGNOSIS — R778 Other specified abnormalities of plasma proteins: Secondary | ICD-10-CM | POA: Diagnosis present

## 2015-11-25 LAB — POCT I-STAT 3, ART BLOOD GAS (G3+)
Acid-base deficit: 4 mmol/L — ABNORMAL HIGH (ref 0.0–2.0)
Bicarbonate: 20.8 mmol/L (ref 20.0–28.0)
O2 SAT: 90 %
PCO2 ART: 33.8 mmHg (ref 32.0–48.0)
PO2 ART: 58 mmHg — AB (ref 83.0–108.0)
Patient temperature: 98.7
TCO2: 22 mmol/L (ref 0–100)
pH, Arterial: 7.397 (ref 7.350–7.450)

## 2015-11-25 LAB — RAPID URINE DRUG SCREEN, HOSP PERFORMED
AMPHETAMINES: NOT DETECTED
BARBITURATES: NOT DETECTED
BENZODIAZEPINES: NOT DETECTED
Cocaine: NOT DETECTED
Opiates: NOT DETECTED
Tetrahydrocannabinol: NOT DETECTED

## 2015-11-25 LAB — I-STAT TROPONIN, ED: Troponin i, poc: 0.22 ng/mL (ref 0.00–0.08)

## 2015-11-25 LAB — CBC
HEMATOCRIT: 36.2 % (ref 36.0–46.0)
HEMOGLOBIN: 10.8 g/dL — AB (ref 12.0–15.0)
MCH: 24.8 pg — AB (ref 26.0–34.0)
MCHC: 29.8 g/dL — ABNORMAL LOW (ref 30.0–36.0)
MCV: 83.2 fL (ref 78.0–100.0)
PLATELETS: 281 10*3/uL (ref 150–400)
RBC: 4.35 MIL/uL (ref 3.87–5.11)
RDW: 21 % — ABNORMAL HIGH (ref 11.5–15.5)
WBC: 3.5 10*3/uL — AB (ref 4.0–10.5)

## 2015-11-25 LAB — COMPREHENSIVE METABOLIC PANEL
ALK PHOS: 37 U/L — AB (ref 38–126)
ALT: 13 U/L — AB (ref 14–54)
AST: 31 U/L (ref 15–41)
Albumin: 3.2 g/dL — ABNORMAL LOW (ref 3.5–5.0)
Anion gap: 10 (ref 5–15)
BILIRUBIN TOTAL: 0.9 mg/dL (ref 0.3–1.2)
BUN: 17 mg/dL (ref 6–20)
CALCIUM: 9.2 mg/dL (ref 8.9–10.3)
CO2: 23 mmol/L (ref 22–32)
CREATININE: 0.97 mg/dL (ref 0.44–1.00)
Chloride: 110 mmol/L (ref 101–111)
GFR calc non Af Amer: 57 mL/min — ABNORMAL LOW (ref >60)
Glucose, Bld: 99 mg/dL (ref 65–99)
Potassium: 4.2 mmol/L (ref 3.5–5.1)
SODIUM: 143 mmol/L (ref 135–145)
TOTAL PROTEIN: 7.7 g/dL (ref 6.5–8.1)

## 2015-11-25 LAB — DIFFERENTIAL
Basophils Absolute: 0 10*3/uL (ref 0.0–0.1)
Basophils Relative: 1 %
EOS PCT: 1 %
Eosinophils Absolute: 0 10*3/uL (ref 0.0–0.7)
LYMPHS ABS: 1.2 10*3/uL (ref 0.7–4.0)
LYMPHS PCT: 33 %
MONO ABS: 0.4 10*3/uL (ref 0.1–1.0)
MONOS PCT: 10 %
NEUTROS ABS: 2 10*3/uL (ref 1.7–7.7)
Neutrophils Relative %: 55 %

## 2015-11-25 LAB — GRAM STAIN

## 2015-11-25 LAB — I-STAT CHEM 8, ED
BUN: 26 mg/dL — AB (ref 6–20)
CREATININE: 1 mg/dL (ref 0.44–1.00)
Calcium, Ion: 0.96 mmol/L — ABNORMAL LOW (ref 1.15–1.40)
Chloride: 109 mmol/L (ref 101–111)
GLUCOSE: 94 mg/dL (ref 65–99)
HCT: 37 % (ref 36.0–46.0)
Hemoglobin: 12.6 g/dL (ref 12.0–15.0)
POTASSIUM: 7.4 mmol/L — AB (ref 3.5–5.1)
Sodium: 141 mmol/L (ref 135–145)
TCO2: 27 mmol/L (ref 0–100)

## 2015-11-25 LAB — URINALYSIS, ROUTINE W REFLEX MICROSCOPIC
Bilirubin Urine: NEGATIVE
GLUCOSE, UA: NEGATIVE mg/dL
HGB URINE DIPSTICK: NEGATIVE
KETONES UR: NEGATIVE mg/dL
Leukocytes, UA: NEGATIVE
Nitrite: NEGATIVE
PROTEIN: NEGATIVE mg/dL
Specific Gravity, Urine: 1.011 (ref 1.005–1.030)
pH: 6 (ref 5.0–8.0)

## 2015-11-25 LAB — APTT: aPTT: 35 seconds (ref 24–36)

## 2015-11-25 LAB — PROTIME-INR
INR: 1.64
Prothrombin Time: 19.6 seconds — ABNORMAL HIGH (ref 11.4–15.2)

## 2015-11-25 LAB — TROPONIN I
Troponin I: 0.19 ng/mL (ref <0.03)
Troponin I: 0.19 ng/mL (ref <0.03)

## 2015-11-25 LAB — I-STAT CG4 LACTIC ACID, ED: Lactic Acid, Venous: 3.26 mmol/L (ref 0.5–1.9)

## 2015-11-25 LAB — BRAIN NATRIURETIC PEPTIDE: B NATRIURETIC PEPTIDE 5: 1953.5 pg/mL — AB (ref 0.0–100.0)

## 2015-11-25 LAB — LACTIC ACID, PLASMA
Lactic Acid, Venous: 2.8 mmol/L (ref 0.5–1.9)
Lactic Acid, Venous: 2.7 mmol/L (ref 0.5–1.9)

## 2015-11-25 LAB — MRSA PCR SCREENING: MRSA BY PCR: NEGATIVE

## 2015-11-25 LAB — STREP PNEUMONIAE URINARY ANTIGEN: Strep Pneumo Urinary Antigen: NEGATIVE

## 2015-11-25 LAB — VITAMIN B12: Vitamin B-12: 1456 pg/mL — ABNORMAL HIGH (ref 180–914)

## 2015-11-25 MED ORDER — DEXTROSE 5 % IV SOLN
2.0000 g | Freq: Two times a day (BID) | INTRAVENOUS | Status: DC
  Administered 2015-11-25 – 2015-11-29 (×7): 2 g via INTRAVENOUS
  Filled 2015-11-25 (×10): qty 2

## 2015-11-25 MED ORDER — VANCOMYCIN HCL IN DEXTROSE 1-5 GM/200ML-% IV SOLN
1000.0000 mg | Freq: Once | INTRAVENOUS | Status: AC
  Administered 2015-11-25: 1000 mg via INTRAVENOUS
  Filled 2015-11-25: qty 200

## 2015-11-25 MED ORDER — APIXABAN 5 MG PO TABS
5.0000 mg | ORAL_TABLET | Freq: Two times a day (BID) | ORAL | Status: DC
  Administered 2015-11-25 – 2015-11-28 (×6): 5 mg via ORAL
  Filled 2015-11-25 (×11): qty 1

## 2015-11-25 MED ORDER — ATORVASTATIN CALCIUM 40 MG PO TABS
40.0000 mg | ORAL_TABLET | Freq: Every day | ORAL | Status: DC
  Administered 2015-11-26 – 2015-11-27 (×2): 40 mg via ORAL
  Filled 2015-11-25 (×2): qty 1

## 2015-11-25 MED ORDER — SODIUM CHLORIDE 0.9 % IV BOLUS (SEPSIS)
500.0000 mL | Freq: Once | INTRAVENOUS | Status: AC
  Administered 2015-11-25: 500 mL via INTRAVENOUS

## 2015-11-25 MED ORDER — POLYETHYLENE GLYCOL 3350 17 G PO PACK: 17.0000 g | PACK | Freq: Every day | ORAL | Status: DC | PRN

## 2015-11-25 MED ORDER — ONDANSETRON HCL 4 MG PO TABS: 4.0000 mg | ORAL_TABLET | Freq: Four times a day (QID) | ORAL | Status: DC | PRN

## 2015-11-25 MED ORDER — ACETAMINOPHEN 160 MG/5ML PO SOLN: 500.0000 mg | ORAL | Status: DC

## 2015-11-25 MED ORDER — ASPIRIN EC 81 MG PO TBEC
81.0000 mg | DELAYED_RELEASE_TABLET | Freq: Every day | ORAL | Status: DC
  Administered 2015-11-26 – 2015-11-28 (×3): 81 mg via ORAL
  Filled 2015-11-25 (×6): qty 1

## 2015-11-25 MED ORDER — IBUPROFEN 200 MG PO TABS: 400.0000 mg | ORAL_TABLET | Freq: Four times a day (QID) | ORAL | Status: DC | PRN

## 2015-11-25 MED ORDER — ACETAMINOPHEN 650 MG RE SUPP
650.0000 mg | Freq: Four times a day (QID) | RECTAL | Status: DC | PRN
  Administered 2015-11-29 – 2015-11-30 (×2): 650 mg via RECTAL
  Filled 2015-11-25 (×2): qty 1

## 2015-11-25 MED ORDER — SERTRALINE HCL 50 MG PO TABS
75.0000 mg | ORAL_TABLET | Freq: Every day | ORAL | Status: DC
  Administered 2015-11-26 – 2015-11-28 (×3): 75 mg via ORAL
  Filled 2015-11-25: qty 1
  Filled 2015-11-25 (×2): qty 2
  Filled 2015-11-25 (×2): qty 1
  Filled 2015-11-25: qty 2

## 2015-11-25 MED ORDER — ONDANSETRON HCL 4 MG/2ML IJ SOLN: 4.0000 mg | Freq: Four times a day (QID) | INTRAMUSCULAR | Status: DC | PRN

## 2015-11-25 MED ORDER — PIPERACILLIN-TAZOBACTAM 3.375 G IVPB
3.3750 g | Freq: Once | INTRAVENOUS | Status: AC
  Administered 2015-11-25: 3.375 g via INTRAVENOUS
  Filled 2015-11-25: qty 50

## 2015-11-25 MED ORDER — SODIUM CHLORIDE 0.9 % IV SOLN
INTRAVENOUS | Status: AC
  Administered 2015-11-25: 13:00:00 via INTRAVENOUS

## 2015-11-25 MED ORDER — LORAZEPAM 2 MG/ML IJ SOLN: 1.0000 mg | Freq: Once | INTRAMUSCULAR | Status: DC

## 2015-11-25 MED ORDER — SODIUM CHLORIDE 0.9 % IV SOLN: INTRAVENOUS | Status: DC

## 2015-11-25 MED ORDER — SODIUM CHLORIDE 0.9 % IV SOLN: 1.0000 mg/kg | INTRAVENOUS | Status: DC

## 2015-11-25 MED ORDER — SODIUM CHLORIDE 0.9 % IV BOLUS (SEPSIS)
1000.0000 mL | Freq: Once | INTRAVENOUS | Status: AC
  Administered 2015-11-25: 1000 mL via INTRAVENOUS

## 2015-11-25 MED ORDER — SODIUM CHLORIDE 0.9% FLUSH
3.0000 mL | Freq: Two times a day (BID) | INTRAVENOUS | Status: DC
  Administered 2015-11-26 – 2015-11-29 (×6): 3 mL via INTRAVENOUS

## 2015-11-25 MED ORDER — DEXTROSE 5 % IV SOLN: 1.0000 g | Freq: Three times a day (TID) | INTRAVENOUS | Status: DC

## 2015-11-25 MED ORDER — ACETAMINOPHEN 325 MG PO TABS: 650.0000 mg | ORAL_TABLET | Freq: Four times a day (QID) | ORAL | Status: DC | PRN

## 2015-11-25 MED ORDER — VANCOMYCIN HCL 500 MG IV SOLR
500.0000 mg | Freq: Two times a day (BID) | INTRAVENOUS | Status: DC
  Administered 2015-11-26 – 2015-11-27 (×5): 500 mg via INTRAVENOUS
  Filled 2015-11-25 (×6): qty 500

## 2015-11-25 MED ORDER — CALAZIME SKIN PROTECTANT EX PSTE: PASTE | Freq: Two times a day (BID) | CUTANEOUS | Status: DC

## 2015-11-25 NOTE — ED Provider Notes (Signed)
[facility]-EMERGENCY DEPT Provider Note   CSN: [id number] Arrival date & time: 11/25/15  0957     History   Chief Complaint Chief Complaint  Patient presents with  . Altered Mental Status    HPI Rebecca [name] is a 70 y.o. female.  Patient was found at nursing home unresponsive. Patient usually alert but nonverbal from stroke   The history is provided by the EMS personnel. No language interpreter was used.  Altered Mental Status   This is a new problem. The current episode started 6 to 12 hours ago. The problem has not changed since onset.Associated symptoms include somnolence. Risk factors: Previous stroke. Her past medical history is significant for COPD. Her past medical history does not include seizures.    Past Medical History:  Diagnosis Date  . Atrial fibrillation (HCC) 05/01/2013  . Depression   . Diabetes mellitus without complication (HCC)   . Expressive aphasia 03/23/2013  . 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 Fabry's disease (HCC)   . Hypertension   . Stroke (HCC)    2015  . Stroke [facility](HCC) 2016   aphasia  . Tobacco abuse 05/01/2013    Patient Active Problem List   Diagnosis Date Noted  . Acute encephalopathy 11/25/2015  . Hyperkalemia 11/25/2015  . HCAP (healthcare-associated pneumonia) 11/25/2015  . Pneumonia 11/25/2015  . 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 Systolic and diastolic CHF, chronic (HCC) 07/26/2015  . Anemia, chronic disease 07/23/2015  . Hematochezia 07/23/2015  . Lower GI bleed 07/18/2015  . Late effects of CVA (cerebrovascular accident) 07/06/2015  . UTI (urinary tract infection) 04/18/2015  . E. coli UTI 04/05/2015  . 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 Dysphagia S/P CVA (cerebrovascular accident) 04/22/2014  . 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 Global aphasia   . Paroxysmal atrial fibrillation (HCC)    . Hyperlipidemia   . Pulmonary hypertension (HCC)   . Elevated troponin 04/01/2014    Past Surgical History:  Procedure Laterality Date  . CESAREAN SECTION     x3  . hip replacement Right 1990's  . hip replacemet Left 1990's  . LAPAROSCOPIC GASTROSTOMY N/A 04/20/2014   Procedure: LAPAROSCOPIC GASTROSTOMY TUBE PLACEMENT;  Surgeon: Axel FillerArmando Ramirez, MD;  Location: [facility] OR;  Service: General;  Laterality: N/A;  . PEG tube placemnt  04/15/14  . TEE WITHOUT CARDIOVERSION N/A 03/26/2013   Procedure: TRANSESOPHAGEAL ECHOCARDIOGRAM (TEE);  Surgeon: Thurmon FairMihai Croitoru, MD;  [facility];  Service: Cardiovascular;  Laterality: N/A;  . TOOTH EXTRACTION Right 1/17   lower canine    OB History    No data available       Home Medications    Prior to Admission medications   Medication Sig Start Date End Date Taking? Authorizing Provider  apixaban (ELIQUIS) 5 MG TABS tablet Take 5 mg by mouth 2 (two) times daily.    Yes Historical Provider, MD  atorvastatin (LIPITOR) 40 MG tablet Take 1 tablet (40 mg total) by mouth daily at 6 PM. 03/27/13  Yes Myra RudeJeremy E Schmitz, MD  metoprolol tartrate (LOPRESSOR) 25 MG tablet Take 0.5 tablets (12.5 mg total) by mouth 2 (two) times daily. 04/07/14  Yes Drema Dallasurtis J Woods, MD  mirtazapine (REMERON) 7.5 MG tablet Take 7.5 mg by mouth at bedtime.   Yes Historical Provider, MD  moxifloxacin (AVELOX) 400 MG tablet Take 400 mg by mouth daily at 8 pm. 11/24/15 12/03/15 Yes Historical Provider, MD  Multiple Vitamin (MULTI VITAMIN DAILY PO) Take 1 tablet by mouth daily.   Yes Historical Provider, MD  polyethylene glycol (MIRALAX / GLYCOLAX) packet Take 17 g by mouth daily.   Yes Historical Provider, MD  saccharomyces boulardii (FLORASTOR) 250 MG capsule Take 250 mg by mouth 2 (two) times daily. 11/18/15 11/28/15 Yes Historical Provider, MD  sertraline (ZOLOFT) 50 MG tablet Take 75 mg by mouth daily.    Yes Historical Provider, MD  Skin Protectants, Misc. (CALAZIME SKIN PROTECTANT  EX) Apply 1 application topically 2 (two) times daily. To buttock   Yes Historical Provider, MD    Family History Family History  Problem Relation Age of Onset  . Alcohol abuse Maternal Aunt     Social History Social History  Substance Use Topics  . Smoking status: Never Smoker  . Smokeless tobacco: Never Used  . Alcohol use No     Allergies   Lisinopril   Review of Systems Review of Systems  Unable to perform ROS: Mental status change     Physical Exam Updated Vital Signs BP 136/93   Pulse 67   Temp (!) 96.4 F (35.8 C) (Axillary)   Resp 15   SpO2 100%   Physical Exam  Constitutional:  Cachectic  HENT:  Head: Normocephalic.  Eyes: Conjunctivae and EOM are normal. No scleral icterus.  Neck: Neck supple. No thyromegaly present.  Cardiovascular: Normal rate and regular rhythm.  Exam reveals no gallop and no friction rub.   No murmur heard. Pulmonary/Chest: No stridor. She has no wheezes. She has no rales. She exhibits no tenderness.  Abdominal: She exhibits no distension. There is no tenderness. There is no rebound.  Musculoskeletal: Normal range of motion. She exhibits no edema.  Lymphadenopathy:    She has no cervical adenopathy.  Neurological: She exhibits normal muscle tone. Coordination normal.  Patient will only responding to painful stimuli with withdrawing her arms and legs  Skin: No rash noted. No erythema.     ED Treatments / Results  Labs (all labs ordered are listed, but only abnormal results are displayed) Labs Reviewed  PROTIME-INR - Abnormal; Notable for the following:       Result Value   Prothrombin Time 19.6 (*)    All other components within normal limits  CBC - Abnormal; Notable for the following:    WBC 3.5 (*)    Hemoglobin 10.8 (*)    MCH 24.8 (*)    MCHC 29.8 (*)    RDW 21.0 (*)    All other components within normal limits  I-STAT TROPOININ, ED - Abnormal; Notable for the following:    Troponin i, poc 0.22 (*)    All  other components within normal limits  I-STAT CHEM 8, ED - Abnormal; Notable for the following:    Potassium 7.4 (*)    BUN 26 (*)    Calcium, Ion 0.96 (*)    All other components within normal limits  I-STAT CG4 LACTIC ACID, ED - Abnormal; Notable for the following:    Lactic Acid, Venous 3.26 (*)    All other components within normal limits  CULTURE, BLOOD (ROUTINE X 2)  CULTURE, BLOOD (ROUTINE X 2)  URINE CULTURE  APTT  DIFFERENTIAL  COMPREHENSIVE METABOLIC PANEL  URINE RAPID DRUG SCREEN, HOSP PERFORMED  BRAIN NATRIURETIC PEPTIDE  URINALYSIS, ROUTINE W REFLEX MICROSCOPIC (NOT AT [facility])  CBG MONITORING, ED  I-STAT CG4 LACTIC ACID, ED    EKG  EKG Interpretation None  Radiology Ct Head Wo Contrast  Result Date: 11/25/2015 CLINICAL DATA:  Right-sided facial droop EXAM: CT HEAD WITHOUT CONTRAST TECHNIQUE: Contiguous axial images were obtained from the base of the skull through the vertex without intravenous contrast. COMPARISON:  October 11, 2015 FINDINGS: Brain: Mild diffuse atrophy remain stable. There is no intracranial mass, hemorrhage, extra-axial fluid collection, or midline shift. There is extensive small vessel disease throughout the centra semiovale bilaterally. Small vessel disease is noted in each thalamus. There is evidence of a prior infarct in the superior posterior left frontal lobe. There is evidence of a prior infarct on the right at the parieto-occipital junction. There is evidence of a prior small infarct in the posterior superior right cerebellum. No new gray-white compartment lesions are evident. No acute infarct is demonstrable compared to prior study. Vascular: There is no hyperdense vessel appreciable. There are focal areas of calcification in the carotid siphons bilaterally as well as in the distal vertebral artery bilaterally and basilar artery regions. Skull: The bony calvarium appears intact. Sinuses/Orbits: Orbits appear symmetric bilaterally. There is  opacification in the left sphenoid sinus. Other visualized paranasal sinuses are clear. Other: Mastoid air cells are clear. IMPRESSION: Atrophy with prior infarcts and extensive small vessel disease, stable. No acute infarct evident. No intracranial mass, hemorrhage, or extra-axial fluid collection. There are multiple foci of arterial vascular calcification. There is sphenoid sinus opacification. Electronically Signed   By: Bretta Bang III M.D.   On: 11/25/2015 11:28   Dg Chest Port 1 View  Result Date: 11/25/2015 CLINICAL DATA:  Weakness, altered mental status, history of diabetes, atrial fibrillation, previous CVA, current smoker. EXAM: PORTABLE CHEST 1 VIEW COMPARISON:  Chest x-ray of April 02, 2014 FINDINGS: The lungs are adequately inflated. There is increased density at the right lung base. There is no pleural effusion. The cardiac silhouette is enlarged but stable. The pulmonary vascularity is normal. There is calcification in the wall of the thoracic aorta. The bony thorax exhibits no acute abnormality. IMPRESSION: Right lower lobe atelectasis or pneumonia. Followup PA and lateral chest X-ray is recommended in 3-4 weeks following trial of antibiotic therapy to ensure resolution and exclude underlying malignancy. Stable cardiomegaly without pulmonary vascular congestion. Aortic atherosclerosis. Electronically Signed   By: David  [place] M.D.   On: 11/25/2015 11:04    Procedures Procedures (including critical care time)  Medications Ordered in ED Medications  vancomycin (VANCOCIN) IVPB 1000 mg/200 mL premix (not administered)  piperacillin-tazobactam (ZOSYN) IVPB 3.375 g (not administered)  sodium chloride 0.9 % bolus 500 mL (500 mLs Intravenous New Bag/Given 11/25/15 1059)     Initial Impression / Assessment and Plan / ED Course  I have reviewed the triage vital signs and the nursing notes.  Pertinent labs & imaging results that were available during my care of the patient were  reviewed by me and considered in my medical decision making (see chart for details).  Clinical Course   CRITICAL CARE Performed by: Sevyn Markham L Total critical care time: 45 minutes Critical care time was exclusive of separately billable procedures and treating other patients. Critical care was necessary to treat or prevent imminent or life-threatening deterioration. Critical care was time spent personally by me on the following activities: development of treatment plan with patient and/or surrogate as well as nursing, discussions with consultants, evaluation of patient's response to treatment, examination of patient, obtaining history from patient or surrogate, ordering and performing treatments and interventions, ordering and review of laboratory studies, ordering and review of radiographic studies,  pulse oximetry and re-evaluation of patient's condition. Patient with possible pneumonia and sepsis. CT scan the head is negative doubt she had another stroke. After treatment in emergency department patient became more alert and would open her eyes to her name. Patient also with non-STEMI. Patient will be admitted to stepdown with cardiology consult  Final Clinical Impressions(s) / ED Diagnoses   Final diagnoses:  Community acquired pneumonia    New Prescriptions New Prescriptions   No medications on file     Bethann Berkshire, MD 11/25/15 1206

## 2015-11-25 NOTE — Progress Notes (Addendum)
Patient having episodes of bradycardia as well as apnea lasting > than 35 seconds on a consistent basis. Charge RN has readjusted leads and adjusted patient position and problem still persists. Have paged the attending.  Dr Susie Cassette returned page, ABG ordered and is consulting CCM.

## 2015-11-25 NOTE — Progress Notes (Signed)
  CRITICAL VALUE ALERT  Critical value received: Troponin 0.19, Lactic acid 2.8  Date of notification:  11/25/2015  Time of notification:  1655  Critical value read back:yes  Nurse who received alert: Shammy MSN  MD notified (1st page): Abrol  Time of first page: 1658  MD notified (2nd page):  Time of second page:  Responding MD:  Susie Cassette  Time MD responded: [phone]

## 2015-11-25 NOTE — H&P (Signed)
History and Physical    Rebecca CarwinConstance E Figuereo WUJ:[id number] DOB: 11/12/1945 DOA: 11/25/2015  PCP: Merrilee SeashoreAnne Alexander, MD Patient coming from: [facility]  Chief Complaint: ams  HPI: Rebecca [name] is a 70 y.o. 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 fib on our requests, hypertension, diabetes type 2, hyperlipidemia, systolic and diastolic heart failure, since emergency department from a nursing facility with the chief complaint of altered mental status. Initial evaluation concerning for sepsis related to healthcare associated pneumonia and elevated troponin concerning for ACS versus demand ischemia  Information is obtained from the staff and the chart as the patient remains somewhat encephalopathic. Ports indicate patient found unresponsive this morning. Poorly patient was at her baseline mental state at bedtime last night which is follow commands but mostly nonverbal. She was awakened this morning staff noted her leaning to the right not following commands. She was sent to the emergency department for acute encephalopathy. No report of any recent fever chills diarrhea vomiting. She is mostly bedbound in the facility and nonverbal.    ED Course: Emergency department she's hemodynamically stable not hypoxic, hypertensive axillary temp 96.4 elevated lactic acid  Review of Systems: As per HPI otherwise 10 point review of systems negative.   Ambulatory Status: She is nonambulatory mostly nonverbal but has developed a communication system where she can make her simple wants and needs known per chart review  Past Medical History:  Diagnosis Date  . Atrial fibrillation (HCC) 05/01/2013  . Depression   . Diabetes mellitus without complication (HCC)   . Expressive aphasia 03/23/2013  . 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 Fabry's disease (HCC)   . Hypertension   . Stroke (HCC)    2015  .  Stroke [facility](HCC) 2016   aphasia  . Tobacco abuse 05/01/2013    Past Surgical History:  Procedure Laterality Date  . CESAREAN SECTION     x3  . hip replacement Right 1990's  . hip replacemet Left 1990's  . LAPAROSCOPIC GASTROSTOMY N/A 04/20/2014   Procedure: LAPAROSCOPIC GASTROSTOMY TUBE PLACEMENT;  Surgeon: Axel FillerArmando Ramirez, MD;  Location: [facility] OR;  Service: General;  Laterality: N/A;  . PEG tube placemnt  04/15/14  . TEE WITHOUT CARDIOVERSION N/A 03/26/2013   Procedure: TRANSESOPHAGEAL ECHOCARDIOGRAM (TEE);  Surgeon: Thurmon FairMihai Croitoru, MD;  [facility];  Service: Cardiovascular;  Laterality: N/A;  . TOOTH EXTRACTION Right 1/17   lower canine    Social History   Social History  . Marital status: Single    Spouse name: N/A  . Number of children: N/A  . Years of education: N/A   Occupational History  . Not on file.   Social History Main Topics  . Smoking status: Never Smoker  . Smokeless tobacco: Never Used  . Alcohol use No  . Drug use: No  . Sexual activity: Not Currently   Other Topics Concern  . Not on file   Social History Narrative  . No narrative on file  Is a resident [facility] nursing facility  Allergies  Allergen Reactions  . Lisinopril Swelling    Family History  Problem Relation Age of Onset  . Alcohol abuse Maternal Aunt     Prior to Admission medications   Medication Sig Start Date End Date Taking? Authorizing Provider  apixaban (ELIQUIS) 5 MG TABS tablet Take 5 mg by mouth 2 (two) times daily.    Yes Historical Provider, MD  atorvastatin (LIPITOR) 40 MG tablet Take  1 tablet (40 mg total) by mouth daily at 6 PM. 03/27/13  Yes Myra Rude, MD  metoprolol tartrate (LOPRESSOR) 25 MG tablet Take 0.5 tablets (12.5 mg total) by mouth 2 (two) times daily. 04/07/14  Yes Drema Dallas, MD  mirtazapine (REMERON) 7.5 MG tablet Take 7.5 mg by mouth at bedtime.   Yes Historical Provider, MD  moxifloxacin (AVELOX) 400 MG tablet Take 400 mg by mouth daily at 8  pm. 11/24/15 12/03/15 Yes Historical Provider, MD  Multiple Vitamin (MULTI VITAMIN DAILY PO) Take 1 tablet by mouth daily.   Yes Historical Provider, MD  polyethylene glycol (MIRALAX / GLYCOLAX) packet Take 17 g by mouth daily.   Yes Historical Provider, MD  saccharomyces boulardii (FLORASTOR) 250 MG capsule Take 250 mg by mouth 2 (two) times daily. 11/18/15 11/28/15 Yes Historical Provider, MD  sertraline (ZOLOFT) 50 MG tablet Take 75 mg by mouth daily.    Yes Historical Provider, MD  Skin Protectants, Misc. (CALAZIME SKIN PROTECTANT EX) Apply 1 application topically 2 (two) times daily. To buttock   Yes Historical Provider, MD    Physical Exam: Vitals:   11/25/15 1230 11/25/15 1243 11/25/15 1245 11/25/15 1300  BP: 143/84 143/84 (!) 135/109 117/94  Pulse:  67    Resp: 21 19 17 11   Temp:  97.5 F (36.4 C)    TempSrc:  Oral    SpO2:  94%       General:  Appears Quite lethargic but not uncomfortable Eyes:  PERRL, EOMI, normal lids, iris ENT:  grossly normal hearing, lips & tongue, mucous membranes of her mouth are moist and pink she tends to eye on her tongue very poor dentition Neck:  no LAD, masses or thyromegaly Cardiovascular:  Irregularly irregular, no m/r/g. No LE edema. [place]andy somewhat cool Respiratory:  CTA bilaterally, no w/r/r. Normal respiratory effort. Snoring respirations Abdomen:  soft, ntnd, positive bowel sounds present sluggish no guarding or rebounding Skin:  no rash or induration seen on limited exam Musculoskeletal:  grossly normal tone BUE/BLE, good ROM, no bony abnormality Psychiatric:  grossly normal mood and affect, speech fluent and appropriate, AOx3 Neurologic:  Arouses briefly to vigorous sternal rub does not follow commands will make eye contact doses back off when not stimulated  Labs on Admission: I have personally reviewed following labs and imaging studies  CBC:  Recent Labs Lab 11/25/15 1050 11/25/15 1100  WBC 3.5*  --   NEUTROABS 2.0  --   HGB  10.8* 12.6  HCT 36.2 37.0  MCV 83.2  --   PLT 281  --    Basic Metabolic Panel:  Recent Labs Lab 11/25/15 1100 11/25/15 1110  NA 141 143  K 7.4* 4.2  CL 109 110  CO2  --  23  GLUCOSE 94 99  BUN 26* 17  CREATININE 1.00 0.97  CALCIUM  --  9.2   GFR: Estimated Creatinine Clearance: 45.9 mL/min (by C-G formula based on SCr of 0.97 mg/dL). Liver Function Tests:  Recent Labs Lab 11/25/15 1110  AST 31  ALT 13*  ALKPHOS 37*  BILITOT 0.9  PROT 7.7  ALBUMIN 3.2*   No results for input(s): LIPASE, AMYLASE in the last 168 hours. No results for input(s): AMMONIA in the last 168 hours. Coagulation Profile:  Recent Labs Lab 11/25/15 1110  INR 1.64   Cardiac Enzymes: No results for input(s): CKTOTAL, CKMB, CKMBINDEX, TROPONINI in the last 168 hours. BNP (last 3 results) No results for input(s): PROBNP in the last  8760 hours. HbA1C: No results for input(s): HGBA1C in the last 72 hours. CBG: No results for input(s): GLUCAP in the last 168 hours. Lipid Profile: No results for input(s): CHOL, HDL, LDLCALC, TRIG, CHOLHDL, LDLDIRECT in the last 72 hours. Thyroid Function Tests: No results for input(s): TSH, T4TOTAL, FREET4, T3FREE, THYROIDAB in the last 72 hours. Anemia Panel: No results for input(s): VITAMINB12, FOLATE, FERRITIN, TIBC, IRON, RETICCTPCT in the last 72 hours. Urine analysis:    Component Value Date/Time   COLORURINE YELLOW 10/11/2015 1656   APPEARANCEUR CLOUDY (A) 10/11/2015 1656   LABSPEC 1.017 10/11/2015 1656   PHURINE 6.0 10/11/2015 1656   GLUCOSEU NEGATIVE 10/11/2015 1656   HGBUR NEGATIVE 10/11/2015 1656   BILIRUBINUR NEGATIVE 10/11/2015 1656   KETONESUR NEGATIVE 10/11/2015 1656   PROTEINUR NEGATIVE 10/11/2015 1656   UROBILINOGEN 1.0 04/12/2014 0102   NITRITE POSITIVE (A) 10/11/2015 1656   LEUKOCYTESUR SMALL (A) 10/11/2015 1656    Creatinine Clearance: Estimated Creatinine Clearance: 45.9 mL/min (by C-G formula based on SCr of 0.97  mg/dL).  Sepsis Labs: @LABRCNTIP (procalcitonin:4,lacticidven:4) )No results found for this or any previous visit (from the past 240 hour(s)).   Radiological Exams on Admission: Ct Head Wo Contrast  Result Date: 11/25/2015 CLINICAL DATA:  Right-sided facial droop EXAM: CT HEAD WITHOUT CONTRAST TECHNIQUE: Contiguous axial images were obtained from the base of the skull through the vertex without intravenous contrast. COMPARISON:  October 11, 2015 FINDINGS: Brain: Mild diffuse atrophy remain stable. There is no intracranial mass, hemorrhage, extra-axial fluid collection, or midline shift. There is extensive small vessel disease throughout the centra semiovale bilaterally. Small vessel disease is noted in each thalamus. There is evidence of a prior infarct in the superior posterior left frontal lobe. There is evidence of a prior infarct on the right at the parieto-occipital junction. There is evidence of a prior small infarct in the posterior superior right cerebellum. No new gray-white compartment lesions are evident. No acute infarct is demonstrable compared to prior study. Vascular: There is no hyperdense vessel appreciable. There are focal areas of calcification in the carotid siphons bilaterally as well as in the distal vertebral artery bilaterally and basilar artery regions. Skull: The bony calvarium appears intact. Sinuses/Orbits: Orbits appear symmetric bilaterally. There is opacification in the left sphenoid sinus. Other visualized paranasal sinuses are clear. Other: Mastoid air cells are clear. IMPRESSION: Atrophy with prior infarcts and extensive small vessel disease, stable. No acute infarct evident. No intracranial mass, hemorrhage, or extra-axial fluid collection. There are multiple foci of arterial vascular calcification. There is sphenoid sinus opacification. Electronically Signed   By: Bretta Bang III M.D.   On: 11/25/2015 11:28   Dg Chest Port 1 View  Result Date: 11/25/2015 CLINICAL  DATA:  Weakness, altered mental status, history of diabetes, atrial fibrillation, previous CVA, current smoker. EXAM: PORTABLE CHEST 1 VIEW COMPARISON:  Chest x-ray of April 02, 2014 FINDINGS: The lungs are adequately inflated. There is increased density at the right lung base. There is no pleural effusion. The cardiac silhouette is enlarged but stable. The pulmonary vascularity is normal. There is calcification in the wall of the thoracic aorta. The bony thorax exhibits no acute abnormality. IMPRESSION: Right lower lobe atelectasis or pneumonia. Followup PA and lateral chest X-ray is recommended in 3-4 weeks following trial of antibiotic therapy to ensure resolution and exclude underlying malignancy. Stable cardiomegaly without pulmonary vascular congestion. Aortic atherosclerosis. Electronically Signed   By: David  [place] M.D.   On: 11/25/2015 11:04    EKG: Independently  reviewed. Sinus arrhythmia Nonspecific IVCD with LAD LVH with secondary repolarization abnormality Anterior infarct,  Assessment/Plan Principal Problem:   Acute encephalopathy Active Problems:   Elevated troponin   Paroxysmal atrial fibrillation (HCC)   Hyperlipidemia   Apraxia following CVA (cerebrovascular accident)   Type II diabetes mellitus with neurological manifestations (HCC)   Benign hypertensive heart disease without heart failure   Anemia, chronic disease   Systolic and diastolic CHF, chronic (HCC)   Fabry disease (HCC)   Hyperkalemia   HCAP (healthcare-associated pneumonia)   #1. Acute encephalopathy. Likely related to possible healthcare associated pneumonia, elevated lactic acid elevated troponin. CT of the head without acute abnormality. Chest x-ray right lower lobe atelectasis or pneumonia. Review indicates baseline is nonverbal but she does communicate [place] she is able to make her simple needs known -Admit to step down -IV fluids -Trend lactic acid -IV antibiotics per pneumonia protocol -Follow  blood cultures -Sputum culture -Strep pneumo urine antigen -Obtain B-12 folate RPR -If no improvement consider further imaging  #2. Elevated troponin. Former smoker history of multiple strokes Fabry disease, HTN.  Initial troponin 0.22. Chart review does indicate history of somewhat elevated troponin.EKG as noted above. Concern for ACS versus demand. -Cycle troponin -Serial EKG -Lipid panel -Continue statin -Cardiology consult  #3. Healthcare associated pneumonia. X-ray with right lower lobe pneumonia or atelectasis. Elevated lactic acid. Oxygen saturation level 97% on room air in the emergency department -Code sepsis called in the emergency department. IV fluids and antibiotics initiated -Blood cultures -Sputum cultures -Antibiotics per protocol -Blood cultures -continue iv fluids -trend lactic acid  4. A. fib. On Eliquis. EKG as noted above. Home medications include metoprolol -Monitor -Hold BB for now  5. Metabolic lactic acidosis. Related to above. Lactic acid 3.26 on presentation. Afebrile hemodynamically stable heart rate 67 mild increase in oxygen need.  She received virgorous IV fluids and antibiotics were initiated -Into the IV fluids judiciously -Trend lactic acid - antibiotics as noted above  6. Diabetes. Diet controlled. Glucose 94 on admission. Chart review indicates recent hemoglobin A1c 5.5 -Monitor CBG daily  #7. History of CVA/aphagia/dysphagia. Chart review indicates patient is on regular diet and thin liquids. No reports of indications of aspiration -We will keep nothing by mouth until more alert  #8. Fabry disease. Chart review indicates no change in status and that patient receives Fabrazyme injection every 2 weeks. Chart review indicates last infusion August 25.    DVT prophylaxis: on eliquis Code Status: full  Family Communication: none present  Disposition Plan: back to facility  Consults called: ED MD called cardiology  Admission status: In  patient admission medically necessary due to unforeseen complications related to possible ACS and sepsis in setting of healthcare associated pneumonia in which patient's physiologic status differ from presentation    Gwenyth Bender MD [facility]  If 7PM-7AM, please contact night-coverage [email or web address] Password [email or web address]  11/25/2015, 1:11 PM

## 2015-11-25 NOTE — ED Notes (Signed)
Attempted report 

## 2015-11-25 NOTE — ED Triage Notes (Signed)
Pt from [place] nursing facility. Pt went to bed at mental baseline- follow commands, nonverbal. Woke up this morning with R sided facial droop and leaning to right side, not following commands Per Staff from facility. Pt woke up like this per EMS. Pt is nonverbal, but can squeeze hands . CBg 121, BP 146/90, sinus rhythm on monitor. HR 70

## 2015-11-25 NOTE — Consult Note (Addendum)
PULMONARY / CRITICAL CARE MEDICINE   Name: Rebecca CarwinConstance E Linnemann MRN: [id number] DOB: 04/21/1945    ADMISSION DATE:  11/25/2015 CONSULTATION DATE:  11/25/2015  REFERRING MD:  Dr. Susie CassetteAbrol  CHIEF COMPLAINT:  Apnea  HISTORY OF PRESENT ILLNESS:   70 year old female with complicated past medical history significant for CVA (with severe residual deficits rendering her non-verbal and minimally interactive in SNF), DM, Fabry disease, and mixed CHF. [facility], but upon first contact with staff 9/7 AM she was noted to have R facial droop and was not quite as responsive as she typically is so she was [facility] workup. In the ED CT head was negative. CXR with questionable hazy opacification in RLL, WBC 3.5, temp 96.4 so concern risen for sepsis. Other laboratory evaluation significant for lactic 3.6 and torponin 0.19. She was admitted to the SDU under hospitalist care with plans for MRI brain, empiric antibiotics for PNA, and IVF hydration. As the day progressed she was noted to have some periods of apnea with HR dropping into 30s. PCCM consulted.   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  PAST SURGICAL HISTORY: She  has a past surgical history that includes TEE without cardioversion (N/A, 03/26/2013); Cesarean section; hip replacement (Right, 1990's); hip replacemet (Left, 1990's); Laparoscopic gastrostomy (N/A, 04/20/2014); PEG tube placemnt (04/15/14); and Tooth extraction (Right, 1/17).  Allergies  Allergen Reactions  . Lisinopril Swelling    No current facility-administered medications on file prior to encounter.    Current Outpatient Prescriptions on File Prior to Encounter   Medication Sig  . apixaban (ELIQUIS) 5 MG TABS tablet Take 5 mg by mouth 2 (two) times daily.   [name]. atorvastatin (LIPITOR) 40 MG tablet Take 1 tablet (40 mg total) by mouth daily at 6 PM.  . metoprolol tartrate (LOPRESSOR) 25 MG tablet Take 0.5 tablets (12.5 mg total) by mouth 2 (two) times daily.  . Multiple Vitamin (MULTI VITAMIN DAILY PO) Take 1 tablet by mouth daily.  . polyethylene glycol (MIRALAX / GLYCOLAX) packet Take 17 g by mouth daily.  [name]. saccharomyces boulardii (FLORASTOR) 250 MG capsule Take 250 mg by mouth 2 (two) times daily.  . sertraline (ZOLOFT) 50 MG tablet Take 75 mg by mouth daily.     FAMILY HISTORY:  Her indicated that her mother is deceased. She indicated that her father is deceased. She indicated that the status of her maternal aunt is unknown.    SOCIAL HISTORY: She  reports that she has never smoked. She has never used smokeless tobacco. She reports that she does not drink alcohol or use drugs.  REVIEW OF SYSTEMS:   Unable due to chronic apahsia  SUBJECTIVE:    VITAL SIGNS: BP (!) 142/96   Pulse (!) 26   Temp 98.7 F (37.1 C) (Oral)   Resp 20   Wt 62.6 kg (138 lb)   SpO2 (!) 69%   BMI 23.69 kg/m   HEMODYNAMICS:    VENTILATOR SETTINGS:    INTAKE / OUTPUT: I/O last 3 completed shifts: In: 1750 [I.V.:500; IV Piggyback:1250] Out: 2 [Urine:1; Stool:1]  PHYSICAL EXAMINATION: General:  Elderly chronically ill appearing female Neuro:  Alert to verbal stimuli. Barely follows commands of thumbs up only. Non-verbal  at baseline HEENT:  Horace/AT, PERRL, no JVD. R sided facial droop Cardiovascular:  NSR with frequent PVC. Bradycardic episodes coincide with apneas on tele.  Lungs:  Clear, diminished. Cheyne stokes respirations Abdomen:  Soft, non-distended Musculoskeletal:  No acute deformity or ROM limitation Skin:  Grossly intact  LABS:  BMET  Recent Labs Lab 11/25/15 1100 11/25/15 1110  NA 141 143  K 7.4* 4.2  CL 109 110  CO2  --  23  BUN  26* 17  CREATININE 1.00 0.97  GLUCOSE 94 99    Electrolytes  Recent Labs Lab 11/25/15 1110  CALCIUM 9.2    CBC  Recent Labs Lab 11/25/15 1050 11/25/15 1100  WBC 3.5*  --   HGB 10.8* 12.6  HCT 36.2 37.0  PLT 281  --     Coag's  Recent Labs Lab 11/25/15 1110  APTT 35  INR 1.64    Sepsis Markers  Recent Labs Lab 11/25/15 1134 11/25/15 1517 11/25/15 1630  LATICACIDVEN 3.26* 2.8* 2.7*    ABG  Recent Labs Lab 11/25/15 1850  PHART 7.397  PCO2ART 33.8  PO2ART 58.0*    Liver Enzymes  Recent Labs Lab 11/25/15 1110  AST 31  ALT 13*  ALKPHOS 37*  BILITOT 0.9  ALBUMIN 3.2*    Cardiac Enzymes  Recent Labs Lab 11/25/15 1517  TROPONINI 0.19*    Glucose No results for input(s): GLUCAP in the last 168 hours.  Imaging Ct Head Wo Contrast  Result Date: 11/25/2015 CLINICAL DATA:  Right-sided facial droop EXAM: CT HEAD WITHOUT CONTRAST TECHNIQUE: Contiguous axial images were obtained from the base of the skull through the vertex without intravenous contrast. COMPARISON:  October 11, 2015 FINDINGS: Brain: Mild diffuse atrophy remain stable. There is no intracranial mass, hemorrhage, extra-axial fluid collection, or midline shift. There is extensive small vessel disease throughout the centra semiovale bilaterally. Small vessel disease is noted in each thalamus. There is evidence of a prior infarct in the superior posterior left frontal lobe. There is evidence of a prior infarct on the right at the parieto-occipital junction. There is evidence of a prior small infarct in the posterior superior right cerebellum. No new gray-white compartment lesions are evident. No acute infarct is demonstrable compared to prior study. Vascular: There is no hyperdense vessel appreciable. There are focal areas of calcification in the carotid siphons bilaterally as well as in the distal vertebral artery bilaterally and basilar artery regions. Skull: The bony calvarium appears intact.  Sinuses/Orbits: Orbits appear symmetric bilaterally. There is opacification in the left sphenoid sinus. Other visualized paranasal sinuses are clear. Other: Mastoid air cells are clear. IMPRESSION: Atrophy with prior infarcts and extensive small vessel disease, stable. No acute infarct evident. No intracranial mass, hemorrhage, or extra-axial fluid collection. There are multiple foci of arterial vascular calcification. There is sphenoid sinus opacification. Electronically Signed   By: Bretta Bang III M.D.   On: 11/25/2015 11:28   Dg Chest Port 1 View  Result Date: 11/25/2015 CLINICAL DATA:  Weakness, altered mental status, history of diabetes, atrial fibrillation, previous CVA, current smoker. EXAM: PORTABLE CHEST 1 VIEW COMPARISON:  Chest x-ray of April 02, 2014 FINDINGS: The lungs are adequately inflated. There is increased density at the right lung base. There is no pleural effusion. The cardiac silhouette is enlarged but stable. The pulmonary vascularity is normal. There is calcification in the wall of the thoracic aorta. The bony thorax exhibits no acute abnormality. IMPRESSION: Right lower lobe atelectasis or pneumonia. Followup PA and lateral  chest X-ray is recommended in 3-4 weeks following trial of antibiotic therapy to ensure resolution and exclude underlying malignancy. Stable cardiomegaly without pulmonary vascular congestion. Aortic atherosclerosis. Electronically Signed   By: David  [place] M.D.   On: 11/25/2015 11:04     STUDIES:  Ct head 9/7 > Atrophy with prior infarcts and extensive small vessel disease, stable. No acute infarct evident. No intracranial mass, hemorrhage, or extra-axial fluid collection. There are multiple foci of arterial vascular calcification. There is sphenoid sinus opacification.  CULTURES: BCx2 9/7 >  Urine 9/7 >  ANTIBIOTICS: Cefepime 9/7 > Vancomycin 9/7 >  SIGNIFICANT EVENTS:   LINES/TUBES:   DISCUSSION: 70 year old female with poor  functional status secondary to CVA in 2015. Admitted with AMS thought secondary to PNA. Also concern CVA. Now with cheyne-stokes and associated intermittent periods of apnea prompting hypoxia/bradycardia. She may ultimately require intubation if these episodes become more frequent or of longer duration. At this point will try to be conservative until contact with family can be established as she is a poor candidate for aggressive ICU interventions. Consider neurology consultation as concern for acute CVA and possible subclinical seizures although suspicion low for the latter. Continue broad ABX for HCAP and MIR pending.   ASSESSMENT / PLAN:  PULMONARY A: Suspect central apnea in setting of multiple strokes with concern for acute stroke Cheyne stokes breathing pattern Hypoxemic respiratory failure in the setting of above  P:   Supplemental O2 2L May need intubation Needs goals of care established. Poor candidate for aggressive ICU interventions. Unable to contact son.   CARDIOVASCULAR A:  Bradycardia, secondary to periods of apnea. HR drops to 30s then recovers.  Elevated troponin > likely demand Chronic systolic/diastolic CHF PAF on eliquis HTN  P:  Telemetry monitoring  May need intubation to prevent bradycardic arrest Monitor closely Trend troponin Holding home metoprolol per primary Cardiology following  RENAL A:   No acute issues  P:   Follow BMP  GASTROINTESTINAL A:   Dysphagia  P:   Diet per primary  HEMATOLOGIC A:   Anemia, at baseline  P:  Follow CBC  INFECTIOUS A:   Possible HCAP  P:   Empiric ABX as above Follow cultures  ENDOCRINE A:   DM  P:   Per primary  NEUROLOGIC A:   Acute encephalopathy potentially in setting infection/HCAP CVA history with residual global weakness and expressive aphasia, nonverbal baseline Concern for acute CVA, could also be subclinical seizures with associated apnea and elevated lactic acid in absence of  hypotension P:   RASS goal: 0 MRI pending Consider neurology consultation Hold all sedating medicaitons   FAMILY  - Updates: Have attempted to contact son to establish goals of care. Left message to call 2H  - Inter-disciplinary family meet or Palliative Care meeting due by:  9/14   Joneen Roach, AGACNP-BC [place] [facility] Pager [phone] or [phone]  11/25/2015 7:34 PM   Attending note: I have seen and examined the patient with nurse practitioner/resident and agree with the note. History, labs and imaging reviewed.  70 Y/O with H/O multiple strokes, cognitive impairment, afib, HTN, hyperlipidemia.  Admitted with RLL pneumonia.  PCCM called for episodes of bradycardia and apnea while asleep.  She seems stable now with HR consistently in 60-70s. There is no imminent need for intubation.  I spoke with the son at bedside. He feels that she should not suffer but is not sure if he wants her intubated. He will need to  discuss with his sister in [place] regarding this. I don't think she is a great candidate for life support given her debility and co morbidities.   -Recommend Primary team to clarify goals of care with the family. -Hold lopressor.  -Continue to monitor resp status and HR. -Keep atropine at bedside  Critical care time - 35 mins. This represents my time independent of the NPs time taking care of the pt.  Chilton Greathouse MD [place] [facility] Pager [phone] If no answer or after 3pm call: [phone] 11/25/2015, 9:40 PM

## 2015-11-25 NOTE — ED Notes (Signed)
No 1426 lab draw,  Pt enroute to floor

## 2015-11-25 NOTE — Consult Note (Signed)
Cardiology Consult    Patient ID: Rebecca [name] MRN: [id number], DOB/AGE: [birth year]   Admit date: 11/25/2015 Date of Consult: 11/25/2015  Primary Physician: Merrilee Seashore, MD Reason for Consult: Elevated Troponin Primary Cardiologist: Dr. Graciela Husbands Requesting Provider: Dr. Susie Cassette   History of Present Illness    Rebecca [name] is a 70 y.o. female with past medical history of PAF, chronic combined systolic and diastolic CHF, CVA (2015 with residual aphasia), Type 2 DM, HTN, HLD and Fabry's disease who presents to [facility] ED on 11/25/2015 from [place] [facility] for new right-sided facial drop and AMS.   Patient is non-verbal and no family members are at the bedside, therefore history is obtained by review of the chart. She does open her eyes in response to her name but quickly falls back asleep. Not nodding her head yes/no to questions.    She was in her normal state of health yesterday evening but this morning but less responsive and had a right-sided facial droop. She is not active at baseline, mostly bedbound and while she is nonverbal, is usually able to nod yes/no to questions.   While admitted, labs have shown a WBC of 3.5, Hgb 10.8, and platelets 281. K+ 4.2. Creatinine 0.97. I-stat troponin 0.22. BNP 1953. Lactic Acid 3.26. EKG shows sinus arrhythmia, HR 65, with no acute ST or T-wave changes. CXR with right lower lobe atelectasis or pneumonia. Followup PA and lateral chest X-ray recommended in 3-4 weeks following trial of antibiotic therapy to ensure resolution and exclude underlying malignancy. Head CT with atrophy with prior infarcts and extensive small vessel disease, stable. No acute infarct evident.  Last echocardiogram was in 03/2014 which showed EF of 45-50% with no regional wall motion abnormalities. Grade 2 DD noted. Moderate AR with mild MR.  PA peak pressure at 42 mm Hg (S). Severe LVH noted. Findings were noted to be suspicious of infiltrative cardiomyopathy.  She was admitted with a recurrent CVA at that time and had mild troponin elevation of 0.25. This was thought to be secondary to demand ischemia in the setting of her CHF exacerbation and CVA and further ischemic evaluation was not pursued.    Past Medical History   Past Medical History:  Diagnosis Date  . Atrial fibrillation (HCC) 05/01/2013  . Depression   . Diabetes mellitus without complication (HCC)   . Expressive aphasia 03/23/2013  . 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 Fabry's disease (HCC)   . Hypertension   . Stroke (HCC)    2015  . Stroke [facility]) 2016   aphasia  . Tobacco abuse 05/01/2013    Past Surgical History:  Procedure Laterality Date  . CESAREAN SECTION     x3  . hip replacement Right 1990's  . hip replacemet Left 1990's  . LAPAROSCOPIC GASTROSTOMY N/A 04/20/2014   Procedure: LAPAROSCOPIC GASTROSTOMY TUBE PLACEMENT;  Surgeon: Axel Filler, MD;  Location: [facility] OR;  Service: General;  Laterality: N/A;  . PEG tube placemnt  04/15/14  . TEE WITHOUT CARDIOVERSION N/A 03/26/2013   Procedure: TRANSESOPHAGEAL ECHOCARDIOGRAM (TEE);  Surgeon: Thurmon Fair, MD;  Location: [facility] ENDOSCOPY;  Service: Cardiovascular;  Laterality: N/A;  . TOOTH EXTRACTION Right 1/17   lower canine     Allergies  Allergies  Allergen Reactions  . Lisinopril Swelling    Inpatient Medications    . apixaban  5 mg Oral BID  . atorvastatin  40 mg Oral q1800  . CALAZIME SKIN PROTECTANT   Apply externally BID  .  sertraline  75 mg Oral Daily  . sodium chloride flush  3 mL Intravenous Q12H    Family History    Family History  Problem Relation Age of Onset  . Alcohol abuse Maternal Aunt     Social History    Social History   Social History  . Marital status: Single    Spouse name: N/A  . Number of children: N/A  . Years of education: N/A   Occupational History  . Not on file.   Social History Main Topics  . Smoking status: Never Smoker  . Smokeless tobacco: Never Used  . Alcohol use No   . Drug use: No  . Sexual activity: Not Currently   Other Topics Concern  . Not on file   Social History Narrative  . No narrative on file     Review of Systems    Unable to be obtained secondary to patient's current mental status.   Physical Exam    Blood pressure 143/84, pulse 67, temperature (!) 96.4 F (35.8 C), temperature source Axillary, resp. rate 21, SpO2 100 %.  General: Elderly African American female appearing in no acute distress. Psych: Normal affect. Neuro: Opens eyes to questions but quickly falls back asleep. Unable to assess A&O. HEENT: Normal  Neck: Supple without bruits or JVD. Lungs:  Resp regular and unlabored, decreased breath sounds at bases. Heart: RRR no s3, s4, 2/6 SEM best appreciated at Apex. Abdomen: Soft, non-tender, non-distended, BS + x 4.  Extremities: No cyanosis or edema. Contractures of lower extremities noted. DP/PT/Radials 2+ and equal bilaterally.  Labs    Troponin (Point of Care Test)  Recent Labs  11/25/15 1058  TROPIPOC 0.22*   No results for input(s): CKTOTAL, CKMB, TROPONINI in the last 72 hours. Lab Results  Component Value Date   WBC 3.5 (L) 11/25/2015   HGB 12.6 11/25/2015   HCT 37.0 11/25/2015   MCV 83.2 11/25/2015   PLT 281 11/25/2015    Recent Labs Lab 11/25/15 1110  NA 143  K 4.2  CL 110  CO2 23  BUN 17  CREATININE 0.97  CALCIUM 9.2  PROT 7.7  BILITOT 0.9  ALKPHOS 37*  ALT 13*  AST 31  GLUCOSE 99   Lab Results  Component Value Date   CHOL 141 10/02/2015   HDL 59 10/02/2015   LDLCALC 66 10/02/2015   TRIG 60 10/02/2015   No results found for: [facility]   Radiology Studies    Ct Head Wo Contrast  Result Date: 11/25/2015 CLINICAL DATA:  Right-sided facial droop EXAM: CT HEAD WITHOUT CONTRAST TECHNIQUE: Contiguous axial images were obtained from the base of the skull through the vertex without intravenous contrast. COMPARISON:  October 11, 2015 FINDINGS: Brain: Mild diffuse atrophy remain stable.  There is no intracranial mass, hemorrhage, extra-axial fluid collection, or midline shift. There is extensive small vessel disease throughout the centra semiovale bilaterally. Small vessel disease is noted in each thalamus. There is evidence of a prior infarct in the superior posterior left frontal lobe. There is evidence of a prior infarct on the right at the parieto-occipital junction. There is evidence of a prior small infarct in the posterior superior right cerebellum. No new gray-white compartment lesions are evident. No acute infarct is demonstrable compared to prior study. Vascular: There is no hyperdense vessel appreciable. There are focal areas of calcification in the carotid siphons bilaterally as well as in the distal vertebral artery bilaterally and basilar artery regions. Skull: The bony calvarium appears  intact. Sinuses/Orbits: Orbits appear symmetric bilaterally. There is opacification in the left sphenoid sinus. Other visualized paranasal sinuses are clear. Other: Mastoid air cells are clear. IMPRESSION: Atrophy with prior infarcts and extensive small vessel disease, stable. No acute infarct evident. No intracranial mass, hemorrhage, or extra-axial fluid collection. There are multiple foci of arterial vascular calcification. There is sphenoid sinus opacification. Electronically Signed   By: Bretta BangWilliam  Woodruff III M.D.   On: 11/25/2015 11:28   Dg Chest Port 1 View  Result Date: 11/25/2015 CLINICAL DATA:  Weakness, altered mental status, history of diabetes, atrial fibrillation, previous CVA, current smoker. EXAM: PORTABLE CHEST 1 VIEW COMPARISON:  Chest x-ray of April 02, 2014 FINDINGS: The lungs are adequately inflated. There is increased density at the right lung base. There is no pleural effusion. The cardiac silhouette is enlarged but stable. The pulmonary vascularity is normal. There is calcification in the wall of the thoracic aorta. The bony thorax exhibits no acute abnormality. IMPRESSION:  Right lower lobe atelectasis or pneumonia. Followup PA and lateral chest X-ray is recommended in 3-4 weeks following trial of antibiotic therapy to ensure resolution and exclude underlying malignancy. Stable cardiomegaly without pulmonary vascular congestion. Aortic atherosclerosis. Electronically Signed   By: David  SwazilandJordan M.D.   On: 11/25/2015 11:04    EKG & Cardiac Imaging    EKG: Sinus arrhythmia, HR 65, with no acute ST or T-wave changes.  Echocardiogram: 03/2014 Study Conclusions  - Left ventricle: The cavity size was normal. There was severe concentric hypertrophy. Systolic function was mildly reduced. The estimated ejection fraction was in the range of 45% to 50%. Wall motion was normal; there were no regional wall motion abnormalities. Features are consistent with a pseudonormal left ventricular filling pattern, with concomitant abnormal relaxation and increased filling pressure (grade 2 diastolic dysfunction). Doppler parameters are consistent with elevated ventricular end-diastolic filling pressure. - Aortic valve: Posteriorly directed jet of the aortic insufficiency hitting the anterior mitral valve leaflet. There was moderate regurgitation. - Aortic root: The aortic root was normal in size. - Mitral valve: Structurally normal valve. There was mild regurgitation. - Left atrium: The atrium was severely dilated. - Right ventricle: Systolic function was mildly reduced. - Right atrium: The atrium was moderately dilated. - Tricuspid valve: There was mild regurgitation. - Pulmonic valve: There was no regurgitation. - Pulmonary arteries: Systolic pressure was mildly increased. PA peak pressure: 42 mm Hg (S). - Inferior vena cava: The vessel was normal in size. - Pericardium, extracardiac: There was no pericardial effusion.  Impressions:  - Compared to the prior study from 03/24/2013 the left ventricle still has severe left ventricular hypertrophy but  there is now mild systolic dysfunction with grade 2 diastolic dysfunction and elevated filling pressures. Right ventricular systolic function is mildly reduced. RVSP 42 mmHg. Moderate aortic and mitral regurgitation. Severely dilated left atrium.  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  Assessment & Plan    1. Elevated Troponin - initial value elevated to 0.22, likely secondary to demand ischemia in the setting of her PNA and AMS with Lactic Acid of 3.26. Patient is aphasic and not responding to questions at this time. Unclear if she has noted any anginal symptoms but none are noted in review of the chart - EKG shows sinus arrhythmia, HR 65, with no acute ST or T-wave changes.  - echocardiogram in 03/2014 showed EF of 45-50% with no regional wall motion abnormalities. Grade 2 DD noted. Has a history of mild troponin elevation up to 0.25  in 2016 om the setting of CVA and CHF exacerbation. - cycle values as already ordered. With no reported anginal symptoms and patient's current medical state, would not anticipate any ischemic workup at this point. If values continue to trend upwards, would obtain echocardiogram as initial step in evaluation.   2. PAF - This patients CHA2DS2-VASc Score and unadjusted Ischemic Stroke Rate (% per year) is equal to 9.7 % stroke rate/year from a score of 6 (CHF, DM, Female, Age, Stroke (2)). Continue Eliquis for anticoagulation.  - BB currently held.   3. Chronic Combined Systolic and Diastolic CHF - echo from 03/2014 showed EF of 45-50% with no regional wall motion abnormalities. Grade 2 DD noted. Moderate AR with mild MR.  PA peak pressure at 42 mm Hg (S). - BNP elevated to 1953. Currently receiving IVF in setting of PNA. Monitor fluid status closely. May require gentle diuresis prior to d/c.  4. Healthcare Associated PNA - CXR with right lower lobe atelectasis or pneumonia. Followup PA and lateral chest X-ray recommended in  3-4 weeks following trial of antibiotic therapy to ensure resolution and exclude underlying malignancy.  - Lactic acid elevated to 3.26. Started on antibiotics while in the ED.  - per admitting team  Signed, Ellsworth Lennox, PA-C 11/25/2015, 12:46 PM Pager: [phone]  I have personally seen and examined this patient with Randall An, PA-C. I agree with the assessment and plan as outlined above. Rebecca [name] is a 70 yo, aphasic nursing home pt who is admitted with altered mental status. It is not clear to me if she is functional at baseline but the records indicate she has been in a poor functional state since her CVA in 2015. She is aphasic. She has PAF and is on Eliquis. She is found to have a pneumonia today. Lactic acid is elevated. Troponin is mildly elevated in the setting of pneumonia, possible sepsis. My exam shows a thin elderly female. She opens her eyes to my voice. CV:RRR, Lungs: clear bilat. Abd: soft  Ext: no edema. Labs reviewed.  She has had no chest pain. Unclear why troponin was checked but it is mildly elevated. Likely demand ischemia in setting of sepsis/pneumonia. Plan to cycle troponin. I would not consider her a candidate for invasive cardiac evaluation. Code status should be addressed.   Verne Carrow 11/25/2015 2:10 PM

## 2015-11-25 NOTE — Progress Notes (Signed)
Pharmacy Antibiotic Note  Rebecca [name] is a 70 y.o. female admitted on 11/25/2015 with pneumonia.  Pharmacy has been consulted for vancomycin and cefepime dosing.  Pt received vancomycin 1g and zosyn 3.375g IV once in the ED.  Plan: Vancomycin 500mg  IV every 12 hours.  Goal trough 15-20 mcg/mL.  Cefepime 2g IV q12h Monitor culture data, renal function and clinical course VT at SS prn     Temp (24hrs), Avg:96.4 F (35.8 C), Min:96.4 F (35.8 C), Max:96.4 F (35.8 C)   Recent Labs Lab 11/25/15 1050 11/25/15 1100 11/25/15 1110 11/25/15 1134  WBC 3.5*  --   --   --   CREATININE  --  1.00 0.97  --   LATICACIDVEN  --   --   --  3.26*    Estimated Creatinine Clearance: 45.9 mL/min (by C-G formula based on SCr of 0.97 mg/dL).    Allergies  Allergen Reactions  . Lisinopril Swelling    Antimicrobials this admission: Vanc 9/7 >>  Cefepime 9/8 >>  Zosyn 9/7  Dose adjustments this admission: n/a  Microbiology results:  BCx:   UCx:    Sputum:    MRSA PCR:    Arlean Hopping. Newman Pies, PharmD, BCPS Clinical Pharmacist Pager [phone] 11/25/2015 12:30 PM

## 2015-11-25 NOTE — Progress Notes (Signed)
Physician has been paged with no response. Will page again.MRI requesting ativan for procedure.

## 2015-11-25 NOTE — Care Management Note (Signed)
Case Management Note  Patient Details  Name: Rebecca [name] MRN: [id number] Date of Birth: Jun 16, 1945  Subjective/Objective:       Adm w encepa             Action/Plan: from [facility]   Expected Discharge Date:                  Expected Discharge Plan:  Skilled Nursing Facility  In-House Referral:  Clinical Social Work  Discharge planning Services     Post Acute Care Choice:    Choice offered to:     DME Arranged:    DME Agency:     HH Arranged:    HH Agency:     Status of Service:  In process, will continue to follow  If discussed at Long Length of Stay Meetings, dates discussed:    Additional Comments:sw ref placed  Hanley Hays, RN 11/25/2015, 3:01 PM

## 2015-11-26 ENCOUNTER — Inpatient Hospital Stay (HOSPITAL_COMMUNITY): Payer: Medicare Other

## 2015-11-26 DIAGNOSIS — I63412 Cerebral infarction due to embolism of left middle cerebral artery: Principal | ICD-10-CM

## 2015-11-26 DIAGNOSIS — I5042 Chronic combined systolic (congestive) and diastolic (congestive) heart failure: Secondary | ICD-10-CM

## 2015-11-26 DIAGNOSIS — I480 Paroxysmal atrial fibrillation: Secondary | ICD-10-CM

## 2015-11-26 DIAGNOSIS — Z7189 Other specified counseling: Secondary | ICD-10-CM

## 2015-11-26 DIAGNOSIS — I482 Chronic atrial fibrillation: Secondary | ICD-10-CM

## 2015-11-26 DIAGNOSIS — E1149 Type 2 diabetes mellitus with other diabetic neurological complication: Secondary | ICD-10-CM

## 2015-11-26 DIAGNOSIS — J189 Pneumonia, unspecified organism: Secondary | ICD-10-CM

## 2015-11-26 LAB — IRON AND TIBC
IRON: 39 ug/dL (ref 28–170)
Saturation Ratios: 12 % (ref 10.4–31.8)
TIBC: 333 ug/dL (ref 250–450)
UIBC: 294 ug/dL

## 2015-11-26 LAB — FERRITIN: FERRITIN: 117 ng/mL (ref 11–307)

## 2015-11-26 LAB — BASIC METABOLIC PANEL
Anion gap: 9 (ref 5–15)
BUN: 14 mg/dL (ref 6–20)
CO2: 22 mmol/L (ref 22–32)
CREATININE: 0.88 mg/dL (ref 0.44–1.00)
Calcium: 8.4 mg/dL — ABNORMAL LOW (ref 8.9–10.3)
Chloride: 112 mmol/L — ABNORMAL HIGH (ref 101–111)
GFR calc Af Amer: >60 mL/min (ref >60)
GLUCOSE: 89 mg/dL (ref 65–99)
Potassium: 5.1 mmol/L (ref 3.5–5.1)
SODIUM: 143 mmol/L (ref 135–145)

## 2015-11-26 LAB — GLUCOSE, CAPILLARY
GLUCOSE-CAPILLARY: 119 mg/dL — AB (ref 65–99)
GLUCOSE-CAPILLARY: 115 mg/dL — AB (ref 65–99)
Glucose-Capillary: 93 mg/dL (ref 65–99)

## 2015-11-26 LAB — URINE CULTURE: Culture: NO GROWTH

## 2015-11-26 LAB — RPR, QUANT+TP ABS (REFLEX): TREPONEMA PALLIDUM AB: NEGATIVE

## 2015-11-26 LAB — CBC
HCT: 29.7 % — ABNORMAL LOW (ref 36.0–46.0)
Hemoglobin: 9.2 g/dL — ABNORMAL LOW (ref 12.0–15.0)
MCH: 25 pg — ABNORMAL LOW (ref 26.0–34.0)
MCHC: 31 g/dL (ref 30.0–36.0)
MCV: 80.7 fL (ref 78.0–100.0)
PLATELETS: 168 10*3/uL (ref 150–400)
RBC: 3.68 MIL/uL — ABNORMAL LOW (ref 3.87–5.11)
RDW: 19.6 % — AB (ref 11.5–15.5)
WBC: 3.5 10*3/uL — AB (ref 4.0–10.5)

## 2015-11-26 LAB — LACTIC ACID, PLASMA: LACTIC ACID, VENOUS: 1.5 mmol/L (ref 0.5–1.9)

## 2015-11-26 LAB — FOLATE RBC
FOLATE, RBC: 1581 ng/mL (ref >498)
Folate, Hemolysate: 502.8 ng/mL
HEMATOCRIT: 31.8 % — AB (ref 34.0–46.6)

## 2015-11-26 LAB — VITAMIN B12: Vitamin B-12: 1214 pg/mL — ABNORMAL HIGH (ref 180–914)

## 2015-11-26 LAB — FOLATE: FOLATE: 14.2 ng/mL (ref >5.9)

## 2015-11-26 LAB — RETICULOCYTES
RBC.: 3.87 MIL/uL (ref 3.87–5.11)
RETIC COUNT ABSOLUTE: 58.1 10*3/uL (ref 19.0–186.0)
Retic Ct Pct: 1.5 % (ref 0.4–3.1)

## 2015-11-26 LAB — RPR: RPR Ser Ql: REACTIVE — AB

## 2015-11-26 MED ORDER — INSULIN ASPART 100 UNIT/ML ~~LOC~~ SOLN
0.0000 [IU] | Freq: Three times a day (TID) | SUBCUTANEOUS | Status: DC
  Administered 2015-11-27: 1 [IU] via SUBCUTANEOUS

## 2015-11-26 MED ORDER — STROKE: EARLY STAGES OF RECOVERY BOOK
Freq: Once | Status: DC
  Filled 2015-11-26 (×2): qty 1

## 2015-11-26 MED ORDER — SENNOSIDES-DOCUSATE SODIUM 8.6-50 MG PO TABS: 1.0000 | ORAL_TABLET | Freq: Every evening | ORAL | Status: DC | PRN

## 2015-11-26 NOTE — Consult Note (Signed)
Admission H&P    Chief Complaint: Altered mental status with facial droop and leaning to the right side.  HPI: Rebecca [name] is an 70 y.o. female with a history of atrial fibrillation on Eliquis, throat with residual aphasia and hemiparesis, hypertension, diabetes mellitus and hyperlipidemia who was admitted on 11/25/2015 for new onset mental status changes, leaning towards the right side and right facial droop. She was admitted for acute encephalopathy, likely related to acute pneumonia, as well as possible recurrent stroke. MRI of the brain showed multiple punctate acute infarctions involving the left MCA territory, likely embolic. 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  LSN: Unclear tPA Given: No: On Eliquis; unclear when last known well mRankin:  Past Medical History:  Diagnosis Date  . Atrial fibrillation ([place]) 05/01/2013  . Depression   . Diabetes mellitus without complication ([place])   . Expressive aphasia 03/23/2013  . 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 Hypertension   . Stroke ([place])    2015  . Stroke [facility]) 2016   aphasia  . Tobacco abuse 05/01/2013    Past Surgical History:  Procedure Laterality Date  . CESAREAN SECTION     x3  . hip replacement Right 1990's  . hip replacemet Left 1990's  . LAPAROSCOPIC GASTROSTOMY N/A 04/20/2014   Procedure: LAPAROSCOPIC GASTROSTOMY TUBE PLACEMENT;  Surgeon: Ralene Ok, MD;  Location: [place];  Service: General;  Laterality: N/A;  . PEG tube placemnt  04/15/14  . TEE WITHOUT CARDIOVERSION N/A 03/26/2013   Procedure: TRANSESOPHAGEAL ECHOCARDIOGRAM (TEE);  Surgeon: Sanda Klein, MD;  Location: [facility] ENDOSCOPY;  Service: Cardiovascular;  Laterality: N/A;  . TOOTH EXTRACTION Right 1/17   lower canine    Family History  Problem Relation Age of Onset  . Alcohol abuse Maternal Aunt    Social History:  reports that she has never smoked. She has never used smokeless tobacco. She  reports that she does not drink alcohol or use drugs.  Allergies:  Allergies  Allergen Reactions  . Lisinopril Swelling    Medications Prior to Admission  Medication Sig Dispense Refill  . apixaban (ELIQUIS) 5 MG TABS tablet Take 5 mg by mouth 2 (two) times daily.     [name] atorvastatin (LIPITOR) 40 MG tablet Take 1 tablet (40 mg total) by mouth daily at 6 PM. 30 tablet 0  . metoprolol tartrate (LOPRESSOR) 25 MG tablet Take 0.5 tablets (12.5 mg total) by mouth 2 (two) times daily. 60 tablet 0  . mirtazapine (REMERON) 7.5 MG tablet Take 7.5 mg by mouth at bedtime.    . moxifloxacin (AVELOX) 400 MG tablet Take 400 mg by mouth daily at 8 pm.    . Multiple Vitamin (MULTI VITAMIN DAILY PO) Take 1 tablet by mouth daily.    . polyethylene glycol (MIRALAX / GLYCOLAX) packet Take 17 g by mouth daily.    [name] saccharomyces boulardii (FLORASTOR) 250 MG capsule Take 250 mg by mouth 2 (two) times daily.    . sertraline (ZOLOFT) 50 MG tablet Take 75 mg by mouth daily.     . Skin Protectants, Misc. (CALAZIME SKIN PROTECTANT EX) Apply 1 application topically 2 (two) times daily. To buttock      ROS: History obtained from chart review  General ROS: negative for - chills, fatigue, fever, night sweats, weight gain or weight loss Psychological ROS: negative for - behavioral disorder, hallucinations, memory difficulties, mood swings or suicidal ideation Ophthalmic ROS: negative for - blurry vision,  double vision, eye pain or loss of vision ENT ROS: negative for - epistaxis, nasal discharge, oral lesions, sore throat, tinnitus or vertigo Allergy and Immunology ROS: negative for - hives or itchy/watery eyes Hematological and Lymphatic ROS: negative for - bleeding problems, bruising or swollen lymph nodes Endocrine ROS: negative for - galactorrhea, hair pattern changes, polydipsia/polyuria or temperature intolerance Respiratory ROS: Acute pneumonia, per history of present illness Cardiovascular ROS: negative for -  chest pain, dyspnea on exertion, edema or irregular heartbeat Gastrointestinal ROS: negative for - abdominal pain, diarrhea, hematemesis, nausea/vomiting or stool incontinence Genito-Urinary ROS: negative for - dysuria, hematuria, incontinence or urinary frequency/urgency Musculoskeletal ROS: negative for - joint swelling or muscular weakness Neurological ROS: as noted in HPI Dermatological ROS: negative for rash and skin lesion changes  Physical Examination: Blood pressure (!) 145/101, pulse 69, temperature 97.9 F (36.6 C), temperature source Oral, resp. rate 16, weight 62.6 kg (138 lb), SpO2 93 %.  HEENT-  Normocephalic, no lesions, without obvious abnormality.  Normal external eye and conjunctiva.  Normal TM's bilaterally.  Normal auditory canals and external ears. Normal external nose, mucus membranes and septum.  Normal pharynx. Neck supple with no masses, nodes, nodules or enlargement. Cardiovascular - irregularly irregular rhythm, S1, S2 normal and no S3 or S4 Lungs - chest clear, no wheezing, rales, normal symmetric air entry Abdomen - soft, non-tender; bowel sounds normal; no masses,  no organomegaly Extremities - no edema  Neurologic Examination: Mental Status: Alert, no acute distress.  Minimal speech output with severe expressive aphasia and mild to moderate receptive aphasia. Able to follow commands fairly well. Cranial Nerves: II-Visual fields intact bilaterally. III/IV/VI-Pupils were equal and reacted. Extraocular movements were full and conjugate.    VII-mild to moderate right lower facial weakness. VIII-normal. X-minimal speech output which was dysarthric. XI: trapezius strength/neck flexion strength normal bilaterally XII-midline tongue extension with normal strength. Motor: Mild diffuse weakness of extremities with moderate increase in muscle tone throughout. Sensory: Normal throughout. Deep Tendon Reflexes: 1+ and symmetric. Plantars: Mute bilaterally Cerebellar:  Difficult to assess because of receptive aphasia. Carotid auscultation: Normal  Results for orders placed or performed during the hospital encounter of 11/25/15 (from the past 48 hour(s))  CBC     Status: Abnormal   Collection Time: 11/25/15 10:50 AM  Result Value Ref Range   WBC 3.5 (L) 4.0 - 10.5 K/uL   RBC 4.35 3.87 - 5.11 MIL/uL   Hemoglobin 10.8 (L) 12.0 - 15.0 g/dL   HCT 36.2 36.0 - 46.0 %   MCV 83.2 78.0 - 100.0 fL   MCH 24.8 (L) 26.0 - 34.0 pg   MCHC 29.8 (L) 30.0 - 36.0 g/dL   RDW 21.0 (H) 11.5 - 15.5 %   Platelets 281 150 - 400 K/uL  Differential     Status: None   Collection Time: 11/25/15 10:50 AM  Result Value Ref Range   Neutrophils Relative % 55 %   Neutro Abs 2.0 1.7 - 7.7 K/uL   Lymphocytes Relative 33 %   Lymphs Abs 1.2 0.7 - 4.0 K/uL   Monocytes Relative 10 %   Monocytes Absolute 0.4 0.1 - 1.0 K/uL   Eosinophils Relative 1 %   Eosinophils Absolute 0.0 0.0 - 0.7 K/uL   Basophils Relative 1 %   Basophils Absolute 0.0 0.0 - 0.1 K/uL  I-stat troponin, ED     Status: Abnormal   Collection Time: 11/25/15 10:58 AM  Result Value Ref Range   Troponin i, poc 0.22 (HH)  0.00 - 0.08 ng/mL   Comment NOTIFIED PHYSICIAN    Comment 3            Comment: Due to the release kinetics of cTnI, a negative result within the first hours of the onset of symptoms does not rule out myocardial infarction with certainty. If myocardial infarction is still suspected, repeat the test at appropriate intervals.   I-Stat Chem 8, ED     Status: Abnormal   Collection Time: 11/25/15 11:00 AM  Result Value Ref Range   Sodium 141 135 - 145 mmol/L   Potassium 7.4 (HH) 3.5 - 5.1 mmol/L   Chloride 109 101 - 111 mmol/L   BUN 26 (H) 6 - 20 mg/dL   Creatinine, Ser 1.00 0.44 - 1.00 mg/dL   Glucose, Bld 94 65 - 99 mg/dL   Calcium, Ion 0.96 (L) 1.15 - 1.40 mmol/L   TCO2 27 0 - 100 mmol/L   Hemoglobin 12.6 12.0 - 15.0 g/dL   HCT 37.0 36.0 - 46.0 %   Comment NOTIFIED PHYSICIAN   Protime-INR      Status: Abnormal   Collection Time: 11/25/15 11:10 AM  Result Value Ref Range   Prothrombin Time 19.6 (H) 11.4 - 15.2 seconds   INR 1.64   APTT     Status: None   Collection Time: 11/25/15 11:10 AM  Result Value Ref Range   aPTT 35 24 - 36 seconds  Comprehensive metabolic panel     Status: Abnormal   Collection Time: 11/25/15 11:10 AM  Result Value Ref Range   Sodium 143 135 - 145 mmol/L   Potassium 4.2 3.5 - 5.1 mmol/L   Chloride 110 101 - 111 mmol/L   CO2 23 22 - 32 mmol/L   Glucose, Bld 99 65 - 99 mg/dL   BUN 17 6 - 20 mg/dL   Creatinine, Ser 0.97 0.44 - 1.00 mg/dL   Calcium 9.2 8.9 - 10.3 mg/dL   Total Protein 7.7 6.5 - 8.1 g/dL   Albumin 3.2 (L) 3.5 - 5.0 g/dL   AST 31 15 - 41 U/L   ALT 13 (L) 14 - 54 U/L   Alkaline Phosphatase 37 (L) 38 - 126 U/L   Total Bilirubin 0.9 0.3 - 1.2 mg/dL   GFR calc non Af Amer 57 (L) >60 mL/min   GFR calc Af Amer >60 >60 mL/min    Comment: (NOTE) The eGFR has been calculated using the CKD EPI equation. This calculation has not been validated in all clinical situations. eGFR's persistently <60 mL/min signify possible Chronic Kidney Disease.    Anion gap 10 5 - 15  Brain natriuretic peptide     Status: Abnormal   Collection Time: 11/25/15 11:10 AM  Result Value Ref Range   B Natriuretic Peptide 1,953.5 (H) 0.0 - 100.0 pg/mL    Comment: HEMOLYSIS AT THIS LEVEL MAY AFFECT RESULT  I-Stat CG4 Lactic Acid, ED     Status: Abnormal   Collection Time: 11/25/15 11:34 AM  Result Value Ref Range   Lactic Acid, Venous 3.26 (HH) 0.5 - 1.9 mmol/L   Comment NOTIFIED PHYSICIAN   Culture, blood (Routine X 2) w Reflex to ID Panel     Status: None (Preliminary result)   Collection Time: 11/25/15 12:05 PM  Result Value Ref Range   Specimen Description BLOOD RIGHT ANTECUBITAL    Special Requests IN PEDIATRIC BOTTLE  1CC    Culture  Setup Time      GRAM POSITIVE COCCI IN CLUSTERS  IN PEDIATRIC BOTTLE Organism ID to follow CRITICAL RESULT CALLED TO,  READ BACK BY AND VERIFIED WITH: N. Batchelder Pharm.D. 14:55 11/26/15 ([email or web address])    Culture GRAM POSITIVE COCCI    Report Status PENDING   Culture, blood (Routine X 2) w Reflex to ID Panel     Status: None (Preliminary result)   Collection Time: 11/25/15 12:28 PM  Result Value Ref Range   Specimen Description BLOOD RIGHT HAND    Special Requests IN PEDIATRIC BOTTLE  1CC    Culture NO GROWTH < 24 HOURS    Report Status PENDING   Rapid urine drug screen (hospital performed)     Status: None   Collection Time: 11/25/15 12:43 PM  Result Value Ref Range   Opiates NONE DETECTED NONE DETECTED   Cocaine NONE DETECTED NONE DETECTED   Benzodiazepines NONE DETECTED NONE DETECTED   Amphetamines NONE DETECTED NONE DETECTED   Tetrahydrocannabinol NONE DETECTED NONE DETECTED   Barbiturates NONE DETECTED NONE DETECTED    Comment:        DRUG SCREEN FOR MEDICAL PURPOSES ONLY.  IF CONFIRMATION IS NEEDED FOR ANY PURPOSE, NOTIFY LAB WITHIN 5 DAYS.        LOWEST DETECTABLE LIMITS FOR URINE DRUG SCREEN Drug Class       Cutoff (ng/mL) Amphetamine      1000 Barbiturate      200 Benzodiazepine   937 Tricyclics       169 Opiates          300 Cocaine          300 THC              50   Urinalysis, Routine w reflex microscopic (not at [facility])     Status: None   Collection Time: 11/25/15 12:43 PM  Result Value Ref Range   Color, Urine YELLOW YELLOW   APPearance CLEAR CLEAR   Specific Gravity, Urine 1.011 1.005 - 1.030   pH 6.0 5.0 - 8.0   Glucose, UA NEGATIVE NEGATIVE mg/dL   Hgb urine dipstick NEGATIVE NEGATIVE   Bilirubin Urine NEGATIVE NEGATIVE   Ketones, ur NEGATIVE NEGATIVE mg/dL   Protein, ur NEGATIVE NEGATIVE mg/dL   Nitrite NEGATIVE NEGATIVE   Leukocytes, UA NEGATIVE NEGATIVE    Comment: MICROSCOPIC NOT DONE ON URINES WITH NEGATIVE PROTEIN, BLOOD, LEUKOCYTES, NITRITE, OR GLUCOSE <1000 mg/dL.  Urine culture     Status: None   Collection Time: 11/25/15 12:43 PM  Result Value Ref Range    Specimen Description URINE, CATHETERIZED    Special Requests NONE    Culture NO GROWTH    Report Status 11/26/2015 FINAL   Gram stain     Status: None   Collection Time: 11/25/15 12:43 PM  Result Value Ref Range   Specimen Description URINE, CATHETERIZED    Special Requests NONE    Gram Stain      WBC PRESENT,BOTH PMN AND MONONUCLEAR GRAM POSITIVE RODS GRAM POSITIVE COCCI IN CLUSTERS GRAM NEGATIVE RODS CYTOSPIN SMEAR    Report Status 11/25/2015 FINAL   Strep pneumoniae urinary antigen     Status: None   Collection Time: 11/25/15 12:43 PM  Result Value Ref Range   Strep Pneumo Urinary Antigen NEGATIVE NEGATIVE    Comment:        Infection due to S. pneumoniae cannot be absolutely ruled out since the antigen present may be below the detection limit of the test.   MRSA PCR Screening     Status: None  Collection Time: 11/25/15  2:52 PM  Result Value Ref Range   MRSA by PCR NEGATIVE NEGATIVE    Comment:        The GeneXpert MRSA Assay (FDA approved for NASAL specimens only), is one component of a comprehensive MRSA colonization surveillance program. It is not intended to diagnose MRSA infection nor to guide or monitor treatment for MRSA infections.   Folate RBC     Status: Abnormal   Collection Time: 11/25/15  3:16 PM  Result Value Ref Range   Folate, Hemolysate 502.8 Not Estab. ng/mL   Hematocrit 31.8 (L) 34.0 - 46.6 %   Folate, RBC 1,581 >498 ng/mL    Comment: (NOTE) Performed At: [facility] [place], [place] [id number] Lindon Romp MD [id number]   Troponin I     Status: Abnormal   Collection Time: 11/25/15  3:17 PM  Result Value Ref Range   Troponin I 0.19 (HH) <0.03 ng/mL    Comment: CRITICAL RESULT CALLED TO, READ BACK BY AND VERIFIED WITHChauncey Cruel ADAMS RN [phone] GREEN R   Vitamin B12     Status: Abnormal   Collection Time: 11/25/15  3:17 PM  Result Value Ref Range   Vitamin B-12 1,456 (H) 180 - 914 pg/mL    Comment:  (NOTE) This assay is not validated for testing neonatal or myeloproliferative syndrome specimens for Vitamin B12 levels.   Lactic acid, plasma     Status: Abnormal   Collection Time: 11/25/15  3:17 PM  Result Value Ref Range   Lactic Acid, Venous 2.8 (HH) 0.5 - 1.9 mmol/L    Comment: CRITICAL RESULT CALLED TO, READ BACK BY AND VERIFIED WITH: S. ADAMS RN [phone] GREEN R   Lactic acid, plasma     Status: Abnormal   Collection Time: 11/25/15  4:30 PM  Result Value Ref Range   Lactic Acid, Venous 2.7 (HH) 0.5 - 1.9 mmol/L    Comment: CRITICAL RESULT CALLED TO, READ BACK BY AND VERIFIED WITH: SAndree Elk RN [phone] GREEN R   I-STAT 3, arterial blood gas (G3+)     Status: Abnormal   Collection Time: 11/25/15  6:50 PM  Result Value Ref Range   pH, Arterial 7.397 7.350 - 7.450   pCO2 arterial 33.8 32.0 - 48.0 mmHg   pO2, Arterial 58.0 (L) 83.0 - 108.0 mmHg   Bicarbonate 20.8 20.0 - 28.0 mmol/L   TCO2 22 0 - 100 mmol/L   O2 Saturation 90.0 %   Acid-base deficit 4.0 (H) 0.0 - 2.0 mmol/L   Patient temperature 98.7 F    Collection site RADIAL, ALLEN'S TEST ACCEPTABLE    Sample type ARTERIAL   Troponin I     Status: Abnormal   Collection Time: 11/25/15  8:05 PM  Result Value Ref Range   Troponin I 0.19 (HH) <0.03 ng/mL    Comment: CRITICAL VALUE NOTED.  VALUE IS CONSISTENT WITH PREVIOUSLY REPORTED AND CALLED VALUE.  Basic metabolic panel     Status: Abnormal   Collection Time: 11/26/15  2:56 AM  Result Value Ref Range   Sodium 143 135 - 145 mmol/L   Potassium 5.1 3.5 - 5.1 mmol/L    Comment: DELTA CHECK NOTED HEMOLYSIS AT THIS LEVEL MAY AFFECT RESULT    Chloride 112 (H) 101 - 111 mmol/L   CO2 22 22 - 32 mmol/L   Glucose, Bld 89 65 - 99 mg/dL   BUN 14 6 - 20 mg/dL   Creatinine, Ser  0.88 0.44 - 1.00 mg/dL   Calcium 8.4 (L) 8.9 - 10.3 mg/dL   GFR calc non Af Amer >60 >60 mL/min   GFR calc Af Amer >60 >60 mL/min    Comment: (NOTE) The eGFR has been calculated using the CKD  EPI equation. This calculation has not been validated in all clinical situations. eGFR's persistently <60 mL/min signify possible Chronic Kidney Disease.    Anion gap 9 5 - 15  CBC     Status: Abnormal   Collection Time: 11/26/15  2:56 AM  Result Value Ref Range   WBC 3.5 (L) 4.0 - 10.5 K/uL   RBC 3.68 (L) 3.87 - 5.11 MIL/uL   Hemoglobin 9.2 (L) 12.0 - 15.0 g/dL    Comment: REPEATED TO VERIFY DELTA CHECK NOTED    HCT 29.7 (L) 36.0 - 46.0 %   MCV 80.7 78.0 - 100.0 fL   MCH 25.0 (L) 26.0 - 34.0 pg   MCHC 31.0 30.0 - 36.0 g/dL   RDW 19.6 (H) 11.5 - 15.5 %   Platelets 168 150 - 400 K/uL  Vitamin B12     Status: Abnormal   Collection Time: 11/26/15  8:16 AM  Result Value Ref Range   Vitamin B-12 1,214 (H) 180 - 914 pg/mL    Comment: (NOTE) This assay is not validated for testing neonatal or myeloproliferative syndrome specimens for Vitamin B12 levels.   Folate     Status: None   Collection Time: 11/26/15  8:16 AM  Result Value Ref Range   Folate 14.2 >5.9 ng/mL  Iron and TIBC     Status: None   Collection Time: 11/26/15  8:16 AM  Result Value Ref Range   Iron 39 28 - 170 ug/dL   TIBC 333 250 - 450 ug/dL   Saturation Ratios 12 10.4 - 31.8 %   UIBC 294 ug/dL  Ferritin     Status: None   Collection Time: 11/26/15  8:16 AM  Result Value Ref Range   Ferritin 117 11 - 307 ng/mL  Reticulocytes     Status: None   Collection Time: 11/26/15  8:16 AM  Result Value Ref Range   Retic Ct Pct 1.5 0.4 - 3.1 %   RBC. 3.87 3.87 - 5.11 MIL/uL   Retic Count, Manual 58.1 19.0 - 186.0 K/uL  Lactic acid, plasma     Status: None   Collection Time: 11/26/15  8:16 AM  Result Value Ref Range   Lactic Acid, Venous 1.5 0.5 - 1.9 mmol/L  Glucose, capillary     Status: None   Collection Time: 11/26/15  1:09 PM  Result Value Ref Range   Glucose-Capillary 93 65 - 99 mg/dL   Comment 1 Capillary Specimen   Glucose, capillary     Status: Abnormal   Collection Time: 11/26/15  5:43 PM  Result Value  Ref Range   Glucose-Capillary 119 (H) 65 - 99 mg/dL   Comment 1 Capillary Specimen   Glucose, capillary     Status: Abnormal   Collection Time: 11/26/15  9:05 PM  Result Value Ref Range   Glucose-Capillary 115 (H) 65 - 99 mg/dL   Comment 1 Capillary Specimen    Ct Head Wo Contrast  Result Date: 11/25/2015 CLINICAL DATA:  Right-sided facial droop EXAM: CT HEAD WITHOUT CONTRAST TECHNIQUE: Contiguous axial images were obtained from the base of the skull through the vertex without intravenous contrast. COMPARISON:  October 11, 2015 FINDINGS: Brain: Mild diffuse atrophy remain stable. There is no  intracranial mass, hemorrhage, extra-axial fluid collection, or midline shift. There is extensive small vessel disease throughout the centra semiovale bilaterally. Small vessel disease is noted in each thalamus. There is evidence of a prior infarct in the superior posterior left frontal lobe. There is evidence of a prior infarct on the right at the parieto-occipital junction. There is evidence of a prior small infarct in the posterior superior right cerebellum. No new gray-white compartment lesions are evident. No acute infarct is demonstrable compared to prior study. Vascular: There is no hyperdense vessel appreciable. There are focal areas of calcification in the carotid siphons bilaterally as well as in the distal vertebral artery bilaterally and basilar artery regions. Skull: The bony calvarium appears intact. Sinuses/Orbits: Orbits appear symmetric bilaterally. There is opacification in the left sphenoid sinus. Other visualized paranasal sinuses are clear. Other: Mastoid air cells are clear. IMPRESSION: Atrophy with prior infarcts and extensive small vessel disease, stable. No acute infarct evident. No intracranial mass, hemorrhage, or extra-axial fluid collection. There are multiple foci of arterial vascular calcification. There is sphenoid sinus opacification. Electronically Signed   By: Lowella Grip III M.D.    On: 11/25/2015 11:28   Mr Brain Wo Contrast  Result Date: 11/26/2015 CLINICAL DATA:  Previous history of stroke. Right facial droop and mental status changes beginning yesterday. EXAM: MRI HEAD WITHOUT CONTRAST TECHNIQUE: Multiplanar, multiecho pulse sequences of the brain and surrounding structures were obtained without intravenous contrast. COMPARISON:  Head CT 11/25/2015.  MRI 04/02/2014. FINDINGS: Brain: The study suffers from motion degradation. There are 3 or 4 scattered punctate foci of restricted diffusion in the left hemisphere in the middle cerebral artery territory consistent with micro embolic infarctions. No large vessel territory acute infarction. The brainstem is normal. There are old small vessel cerebellar infarctions. There is extensive chronic small-vessel ischemic change affecting the thalamotomy, basal ganglia and throughout the cerebral hemispheric white matter. There is an old cortical infarction at the left insula and posterior frontal region. There is old cortical infarction in both occipital regions right more than left. There is old cortical infarction in the right frontal region. No evidence of mass lesion. No sign of acute hemorrhage. There are scattered foci of hemosiderin deposition related to the old infarctions. No hydrocephalus. No extra-axial collection. No pituitary mass. Vascular: Major vessels at the base of the brain show flow. Skull and upper cervical spine: Negative Sinuses/Orbits: No significant sinus disease. No visible orbital pathology. Other: None IMPRESSION: Three or 4 scattered punctate acute infarctions in the left MCA territory consistent with micro embolic infarctions. No large infarction. No swelling or acute hemorrhage. 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 Extensive chronic small vessel ischemic changes elsewhere throughout the brain. Electronically Signed   By: Nelson Chimes M.D.   On: 11/26/2015 12:57   Dg Chest  Port 1 View  Result Date: 11/25/2015 CLINICAL DATA:  Weakness, altered mental status, history of diabetes, atrial fibrillation, previous CVA, current smoker. EXAM: PORTABLE CHEST 1 VIEW COMPARISON:  Chest x-ray of April 02, 2014 FINDINGS: The lungs are adequately inflated. There is increased density at the right lung base. There is no pleural effusion. The cardiac silhouette is enlarged but stable. The pulmonary vascularity is normal. There is calcification in the wall of the thoracic aorta. The bony thorax exhibits no acute abnormality. IMPRESSION: Right lower lobe atelectasis or pneumonia. Followup PA and lateral chest X-ray is recommended in 3-4 weeks following trial of antibiotic therapy to ensure resolution and exclude underlying malignancy.  Stable cardiomegaly without pulmonary vascular congestion. Aortic atherosclerosis. Electronically Signed   By: David  Martinique M.D.   On: 11/25/2015 11:04    Assessment: 70 y.o. female with multiple risk factors for stroke as well as the use cerebral infarctions presenting with recurrent acute punctate ischemic infarctions involving the left MCA territory.  Stroke Risk Factors - atrial fibrillation, diabetes mellitus and hypertension  Plan: 1. HgbA1c, fasting lipid panel 2. MRA  of the brain without contrast 3. PT consult, OT consult, Speech consult 4. Echocardiogram 5. Carotid dopplers 6. Prophylactic therapy-Anticoagulation: Eliquis 7. Risk factor modification 8. Telemetry monitoring  C.R. Nicole Kindred, MD [facility] [phone]  11/26/2015, 9:58 PM

## 2015-11-26 NOTE — Evaluation (Signed)
wClinical/Bedside Swallow Evaluation Patient Details  Name: Rebecca CarwinConstance E [name] MRN: [id number] Date of Birth: 11/25/1945  Today's Date: 11/26/2015 Time: SLP Start Time (ACUTE ONLY): 0907 SLP Stop Time (ACUTE ONLY): 0927 SLP Time Calculation (min) (ACUTE ONLY): 20 min  Past Medical History:  Past Medical History:  Diagnosis Date  . Atrial fibrillation (HCC) 05/01/2013  . Depression   . Diabetes mellitus without complication (HCC)   . Expressive aphasia 03/23/2013  . 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 Fabry's disease (HCC)   . Hypertension   . Stroke (HCC)    2015  . Stroke [facility](HCC) 2016   aphasia  . Tobacco abuse 05/01/2013   Past Surgical History:  Past Surgical History:  Procedure Laterality Date  . CESAREAN SECTION     x3  . hip replacement Right 1990's  . hip replacemet Left 1990's  . LAPAROSCOPIC GASTROSTOMY N/A 04/20/2014   Procedure: LAPAROSCOPIC GASTROSTOMY TUBE PLACEMENT;  Surgeon: Axel FillerArmando Ramirez, MD;  Location: [facility] OR;  Service: General;  Laterality: N/A;  . PEG tube placemnt  04/15/14  . TEE WITHOUT CARDIOVERSION N/A 03/26/2013   Procedure: TRANSESOPHAGEAL ECHOCARDIOGRAM (TEE);  Surgeon: Thurmon FairMihai Croitoru, MD;  [facility];  Service: Cardiovascular;  Laterality: N/A;  . TOOTH EXTRACTION Right 1/17   lower canine   HPI:  70 year old female with complicated past medical history significant for CVA (with severe residual deficits rendering her aphasic and minimally interactive in SNF), DM, Fabry disease, and mixed CHF. She presents 9/7 with R facial droop and AMS with CT head negative. CXR with questionable hazy opacification in RLL. Since admission she has been experiencing periods of apnea and bradycardia. Pt with h/o dysphagia from prior CVA with MBS 03/2014 recommending Dys 1 diet and nectar thick liquids due to silent aspiration of thin liquids. Pt did not advance diet while on CIR, which appears to be in part due to unwillingness to try advanced textures. Pt was  re-evaluated at bedside by SLP  in 07/2015 wtih recommendations for Dys 3 diet and thin liquids.   Assessment / Plan / Recommendation Clinical Impression  Pt has increase in RR with trials of thin liquids, which son reports was happening at SNF as well, where she had recently been downgraded from a regular diet to pureed foods due to oral holding and recent PNA. Small bites of puree are consumed without overt signs of aspiration, as are small sips of nectar thick liquids when SLP is monitoring sip size. She does have oral holding with purees that requires Mod cues from SLP, therefore more advanced trials were deferred. Recommend a more conservative diet with downgrade to Dys 1 textures and nectar thick liquids with full supervision. Discussed with RN and son at bedside - will continue to monitor closely.    Aspiration Risk  Mild aspiration risk;Moderate aspiration risk    Diet Recommendation Dysphagia 1 (Puree);Nectar-thick liquid   Liquid Administration via: Cup;Straw Medication Administration: Crushed with puree Supervision: Staff to assist with self feeding;Full supervision/cueing for compensatory strategies Compensations: Minimize environmental distractions;Slow rate;Small sips/bites Postural Changes: Seated upright at 90 degrees;Remain upright for at least 30 minutes after po intake    Other  Recommendations Oral Care Recommendations: Oral care BID Other Recommendations: Order thickener from pharmacy;Prohibited food (jello, ice cream, thin soups);Remove water pitcher   Follow up Recommendations  Skilled Nursing facility    Frequency and Duration min 2x/week  2 weeks       Prognosis Prognosis for Safe Diet Advancement: Fair Barriers to Reach  Goals: Other (Comment) (PLOF)      Swallow Study   General HPI: 70 year old female with complicated past medical history significant for CVA (with severe residual deficits rendering her aphasic and minimally interactive in SNF), DM, Fabry  disease, and mixed CHF. She presents 9/7 with R facial droop and AMS with CT head negative. CXR with questionable hazy opacification in RLL. Since admission she has been experiencing periods of apnea and bradycardia. Pt with h/o dysphagia from prior CVA with MBS 03/2014 recommending Dys 1 diet and nectar thick liquids due to silent aspiration of thin liquids. Pt did not advance diet while on CIR, which appears to be in part due to unwillingness to try advanced textures. Pt was re-evaluated at bedside by SLP  in 07/2015 wtih recommendations for Dys 3 diet and thin liquids. Type of Study: Bedside Swallow Evaluation Previous Swallow Assessment: see HPI Diet Prior to this Study: Regular;Thin liquids Temperature Spikes Noted: No Respiratory Status: Room air History of Recent Intubation: No Behavior/Cognition: Alert;Requires cueing Oral Cavity Assessment: Within Functional Limits Oral Care Completed by SLP: No Oral Cavity - Dentition: Missing dentition Patient Positioning: Upright in bed Baseline Vocal Quality: Normal    Oral/Motor/Sensory Function     Ice Chips Ice chips: Within functional limits Presentation: Spoon   Thin Liquid Thin Liquid: Impaired Presentation: Spoon;Straw Pharyngeal  Phase Impairments: Suspected delayed Swallow;Change in Vital Signs (increase in RR)    Nectar Thick Nectar Thick Liquid: Impaired Presentation: Straw Pharyngeal Phase Impairments: Suspected delayed Swallow;Change in Vital Signs (increase in RR)   Honey Thick Honey Thick Liquid: Not tested   Puree Puree: Impaired Presentation: Spoon Oral Phase Functional Implications: Oral holding   Solid   GO   Solid: Not tested        Maxcine Ham 11/26/2015,9:59 AM  Maxcine Ham, M.A. CCC-SLP [phone]

## 2015-11-26 NOTE — Progress Notes (Signed)
EEG Completed; Results Pending  

## 2015-11-26 NOTE — Progress Notes (Signed)
Initial Nutrition Assessment  DOCUMENTATION CODES:   Not applicable  INTERVENTION:   -Magic Cup TID with meals  NUTRITION DIAGNOSIS:   Predicted suboptimal nutrient intake related to acute illness as evidenced by  (lethargy/confusion).  GOAL:   Patient will meet greater than or equal to 90% of their needs  MONITOR:   PO intake, Supplement acceptance, Diet advancement, Labs, Weight trends, Skin, I & O's  REASON FOR ASSESSMENT:   Low Braden    ASSESSMENT:   70 year old female with a history of prior stroke, CVA, residual aphasia, atrial fibrillation on eliquis presents to the ER for unresponsiveness, symptoms started 6-12 hours prior to presentation. CT of the head negative for acute stroke. Patient found to have right lower lobe pneumonia and possible sepsis. Started on broad-spectrum antibiotics, troponin slightly abnormal. Cardiology consulted,echo to r/o wall motion abnormalities  Pt admitted with with rt lower lobe pneumonia with possible sepsis, acute encephalopathy, and rt facial droop. Pt is a resident of [facility].   Unable to obtained hx from pt. RN and pt off unit in MRI x 2 when RD attempted to assess pt. No family present.   Reviewed records from [facility]. Pt was on a pureed diet PTA. Reviewed SLP note, which confirmed that pt was placed on a dysphagia 1 diet PTA; noted recommendations for dysphagia 1 diet with nectar thick liquids due to holding food and mild to moderate aspiration risk. PTA medications include MVI, remeron, and 120 ml Medpass 2.0 BID (120 kcals and 5 grams protein daily).   Reviewed wt hx, which revealed wt stability over the last several months.   Per chart review, pt has PEG but has not used in over one year.   Pt at risk for inadequate oral intake given encephalopathy and diet restrictions. Will add Magic cup with meals to optimize nutritional adequacy.   Labs reviewed.  Diet Order:  DIET - DYS 1 Room service appropriate? Yes;  Fluid consistency: Nectar Thick  Skin:  Reviewed, no issues  Last BM:  11/25/15  Height:   Ht Readings from Last 1 Encounters:  11/23/15 5\' 4"  (1.626 m)    Weight:   Wt Readings from Last 1 Encounters:  11/25/15 138 lb (62.6 kg)    Ideal Body Weight:  54.5 kg  BMI:  Body mass index is 23.69 kg/m.  Estimated Nutritional Needs:   Kcal:  1400-1600  Protein:  65-80 grams  Fluid:  1.4-1.6 L  EDUCATION NEEDS:   No education needs identified at this time  Zyairah Wacha A. Mayford Knife, RD, LDN, CDE Pager: [phone] After hours Pager: [phone]

## 2015-11-26 NOTE — Progress Notes (Signed)
PULMONARY / CRITICAL CARE MEDICINE   Name: Rebecca [name] MRN: [id number] DOB: 1945-05-26    ADMISSION DATE:  11/25/2015 CONSULTATION DATE:  11/25/2015  REFERRING MD:  Dr. Susie Cassette  CHIEF COMPLAINT:  Apnea  HISTORY OF PRESENT ILLNESS:   70 year old female with complicated past medical history significant for CVA (with severe residual deficits rendering her non-verbal and minimally interactive in SNF), DM, Fabry disease, and mixed CHF. She was in her [facility] in [facility] 9/8 PM, but upon first contact with staff 9/7 AM she was noted to have R facial droop and was not quite as responsive as she typically is so she was transported to [facility] ED for further workup. In the ED CT head was negative. CXR with questionable hazy opacification in RLL, WBC 3.5, temp 96.4 so concern risen for sepsis. Other laboratory evaluation significant for lactic 3.6 and torponin 0.19. She was admitted to the SDU under hospitalist care with plans for MRI brain, empiric antibiotics for PNA, and IVF hydration. As the day progressed she was noted to have some periods of apnea with HR dropping into 30s. PCCM consulted.     SUBJECTIVE: Improved and in no acute distress   VITAL SIGNS: BP (!) 145/86   Pulse 69   Temp (!) 96.9 F (36.1 C) (Axillary) Comment: RN Notified  Resp (!) 25   Wt 138 lb (62.6 kg)   SpO2 93%   BMI 23.69 kg/m   HEMODYNAMICS:    VENTILATOR SETTINGS:    INTAKE / OUTPUT: I/O last 3 completed shifts: In: 2050 [P.O.:100; I.V.:500; IV Piggyback:1450] Out: 2 [Urine:1; Stool:1]  PHYSICAL EXAMINATION: General:  Elderly chronically ill appearing female Neuro:  Alert to verbal stimuli. Barely follows commands of thumbs up only. Non-verbal at baseline, improved per son HEENT:  [place]/AT, PERRL, no JVD. R sided facial droop Cardiovascular:  NSR with frequent PVC. Bradycardic episodes coincide with apneas on tele.  Lungs:  Clear, diminished. Cheyne stokes respirations Abdomen:  Soft,  non-distended Musculoskeletal:  No acute deformity or ROM limitation Skin:  Grossly intact  LABS:  BMET  Recent Labs Lab 11/25/15 1100 11/25/15 1110 11/26/15 0256  NA 141 143 143  K 7.4* 4.2 5.1  CL 109 110 112*  CO2  --  23 22  BUN 26* 17 14  CREATININE 1.00 0.97 0.88  GLUCOSE 94 99 89    Electrolytes  Recent Labs Lab 11/25/15 1110 11/26/15 0256  CALCIUM 9.2 8.4*    CBC  Recent Labs Lab 11/25/15 1050 11/25/15 1100 11/26/15 0256  WBC 3.5*  --  3.5*  HGB 10.8* 12.6 9.2*  HCT 36.2 37.0 29.7*  PLT 281  --  168    Coag's  Recent Labs Lab 11/25/15 1110  APTT 35  INR 1.64    Sepsis Markers  Recent Labs Lab 11/25/15 1517 11/25/15 1630 11/26/15 0816  LATICACIDVEN 2.8* 2.7* 1.5    ABG  Recent Labs Lab 11/25/15 1850  PHART 7.397  PCO2ART 33.8  PO2ART 58.0*    Liver Enzymes  Recent Labs Lab 11/25/15 1110  AST 31  ALT 13*  ALKPHOS 37*  BILITOT 0.9  ALBUMIN 3.2*    Cardiac Enzymes  Recent Labs Lab 11/25/15 1517 11/25/15 2005  TROPONINI 0.19* 0.19*    Glucose No results for input(s): GLUCAP in the last 168 hours.  Imaging Ct Head Wo Contrast  Result Date: 11/25/2015 CLINICAL DATA:  Right-sided facial droop EXAM: CT HEAD WITHOUT CONTRAST TECHNIQUE: Contiguous axial images were obtained from the  base of the skull through the vertex without intravenous contrast. COMPARISON:  October 11, 2015 FINDINGS: Brain: Mild diffuse atrophy remain stable. There is no intracranial mass, hemorrhage, extra-axial fluid collection, or midline shift. There is extensive small vessel disease throughout the centra semiovale bilaterally. Small vessel disease is noted in each thalamus. There is evidence of a prior infarct in the superior posterior left frontal lobe. There is evidence of a prior infarct on the right at the parieto-occipital junction. There is evidence of a prior small infarct in the posterior superior right cerebellum. No new gray-white  compartment lesions are evident. No acute infarct is demonstrable compared to prior study. Vascular: There is no hyperdense vessel appreciable. There are focal areas of calcification in the carotid siphons bilaterally as well as in the distal vertebral artery bilaterally and basilar artery regions. Skull: The bony calvarium appears intact. Sinuses/Orbits: Orbits appear symmetric bilaterally. There is opacification in the left sphenoid sinus. Other visualized paranasal sinuses are clear. Other: Mastoid air cells are clear. IMPRESSION: Atrophy with prior infarcts and extensive small vessel disease, stable. No acute infarct evident. No intracranial mass, hemorrhage, or extra-axial fluid collection. There are multiple foci of arterial vascular calcification. There is sphenoid sinus opacification. Electronically Signed   By: Bretta Bang III M.D.   On: 11/25/2015 11:28   Dg Chest Port 1 View  Result Date: 11/25/2015 CLINICAL DATA:  Weakness, altered mental status, history of diabetes, atrial fibrillation, previous CVA, current smoker. EXAM: PORTABLE CHEST 1 VIEW COMPARISON:  Chest x-ray of April 02, 2014 FINDINGS: The lungs are adequately inflated. There is increased density at the right lung base. There is no pleural effusion. The cardiac silhouette is enlarged but stable. The pulmonary vascularity is normal. There is calcification in the wall of the thoracic aorta. The bony thorax exhibits no acute abnormality. IMPRESSION: Right lower lobe atelectasis or pneumonia. Followup PA and lateral chest X-ray is recommended in 3-4 weeks following trial of antibiotic therapy to ensure resolution and exclude underlying malignancy. Stable cardiomegaly without pulmonary vascular congestion. Aortic atherosclerosis. Electronically Signed   By: David  [place] M.D.   On: 11/25/2015 11:04     STUDIES:  Ct head 9/7 > Atrophy with prior infarcts and extensive small vessel disease, stable. No acute infarct evident. No  intracranial mass, hemorrhage, or extra-axial fluid collection. There are multiple foci of arterial vascular calcification. There is sphenoid sinus opacification. 9/8 MRI>> 9/8 EEG>>  CULTURES: BCx2 9/7 >  Urine 9/7 >  ANTIBIOTICS: Cefepime 9/7 > Vancomycin 9/7 >  SIGNIFICANT EVENTS:   LINES/TUBES:   DISCUSSION: 70 year old female with poor functional status secondary to CVA in 2015. Admitted with AMS thought secondary to PNA. Also concern CVA. Now with cheyne-stokes and associated intermittent periods of apnea prompting hypoxia/bradycardia. She may ultimately require intubation if these episodes become more frequent or of longer duration. At this point will try to be conservative until contact with family can be established as she is a poor candidate for aggressive ICU interventions. Consider neurology consultation as concern for acute CVA and possible subclinical seizures although suspicion low for the latter. Continue broad ABX for HCAP and MIR pending.  Son to meet with sister about code status. ASSESSMENT / PLAN:  PULMONARY A: Suspect central apnea in setting of multiple strokes with concern for acute stroke Cheyne stokes breathing pattern Hypoxemic respiratory failure in the setting of above  P:   Supplemental O2 2L May need intubation Needs goals of care established. Poor candidate for  aggressive ICU interventions. Son is to meet with sister and is leaning towards dnr. Improved 9/8   CARDIOVASCULAR A:  Bradycardia, secondary to periods of apnea. HR drops to 30s then recovers.  Elevated troponin > likely demand Chronic systolic/diastolic CHF PAF on eliquis HTN  P:  Telemetry monitoring  May need intubation to prevent bradycardic arrest, not currently in distress Monitor closely Trend troponin Holding home metoprolol per primary Cardiology following Code status to be updated  RENAL A:   No acute issues  P:   Follow BMP  GASTROINTESTINAL A:    Dysphagia  P:   Diet per primary  HEMATOLOGIC A:   Anemia, at baseline  P:  Follow CBC  INFECTIOUS A:   Possible HCAP  P:   Empiric ABX as above Follow cultures  ENDOCRINE A:   DM  P:   Per primary  NEUROLOGIC A:   Acute encephalopathy potentially in setting infection/HCAP CVA history with residual global weakness and expressive aphasia, nonverbal baseline Concern for acute CVA, could also be subclinical seizures with associated apnea and elevated lactic acid in absence of hypotension 9/8 back to baseline mental status from previous strokes P:   RASS goal: 0 MRI pending Consider neurology consultation Hold all sedating medicaitons   FAMILY  - Updates: Spoke with son at bedside 9/8 10:30. He is going to discuss code status with sister and leaning towards DNR.  - PCCM will be available PRN but not see unless requested.   - Inter-disciplinary family meet or Palliative Care meeting due by:  9/14   Brett CanalesSteve Minor ACNP Adolph PollackLe Bauer PCCM Pager [phone] till 3 pm If no answer page [phone] 11/26/2015, 10:27 AM  Attending Note:  70 year old female with CVA history that left her debilitated since 2015 who presents to the hospital with AMS and a PNA.  PCCM consulted for bradycardia and PNA.  On exam, non-verbal, debilitated, coarse BS diffusely.  I reviewed CXR myself, RLL infiltrate noted.  Discussed with TRH-MD and PCCM-NP.  PNA:  - Vanc  - Cefepime  - F/u on cultures.  Airway protection: questionable, likely an aspiration PNA.  - SLP but doubt will be able to cooperate.  - Monitor airway protection.   - Son leaning towards DNR.  Bradycardia: secondary to apnea, resolved.  - Monitor respiratory status.  - Tele.  A-fib:  - Eliquis - may have to change to heparin if PO intake becomes an issue.  Continue GOC discussion, PCCM will sign off, please call back if needed.  Patient seen and examined, agree with above note.  I dictated the care and orders written  for this patient under my direction.  Alyson ReedyWesam G Yacoub, MD [phone]

## 2015-11-26 NOTE — [place] FL2 (Deleted)
[place] MEDICAID FL2 LEVEL OF CARE SCREENING TOOL     IDENTIFICATION  Patient Name: Rebecca CarwinConstance E Berkey Birthdate: 05/01/1945 Sex: female Admission Date (Current Location): 11/25/2015  [facility] [place]   Facility and Address:  [facility], [address], [place], [place] [address]      Provider Number: [id number]  Attending Physician Name and Address:  Rodolph Bonganiel V Thompson, MD  Relative Name and Phone Number:  Fayrene FearingJames, son, [phone]    Current Level of Care: Hospital Recommended Level of Care: Skilled Nursing Facility Prior Approval Number:    Date Approved/Denied:   PASRR Number:    Discharge Plan: SNF    Current Diagnoses: Patient Active Problem List   Diagnosis Date Noted  . Acute encephalopathy 11/25/2015  . Hyperkalemia 11/25/2015  . HCAP (healthcare-associated pneumonia) 11/25/2015  . Pneumonia 11/25/2015  . 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 Systolic and diastolic CHF, chronic (HCC) 07/26/2015  . Anemia, chronic disease 07/23/2015  . Hematochezia 07/23/2015  . Lower GI bleed 07/18/2015  . Late effects of CVA (cerebrovascular accident) 07/06/2015  . UTI (urinary tract infection) 04/18/2015  . E. coli UTI 04/05/2015  . 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 Dysphagia S/P CVA (cerebrovascular accident) 04/22/2014  . 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 Global aphasia   . Paroxysmal atrial fibrillation (HCC)   . Hyperlipidemia   . Pulmonary hypertension (HCC)   . Elevated troponin 04/01/2014    Orientation RESPIRATION BLADDER Height & Weight     Self, Place  Normal Incontinent Weight: 62.6 kg (138 lb) Height:     BEHAVIORAL SYMPTOMS/MOOD NEUROLOGICAL BOWEL NUTRITION STATUS  Other (Comment) (none)   Incontinent Diet (Please see DC Summary)  AMBULATORY STATUS COMMUNICATION  OF NEEDS Skin   Extensive Assist Verbally Normal                       Personal Care Assistance Level of Assistance  Bathing, Feeding, Dressing Bathing Assistance: Maximum assistance Feeding assistance: Maximum assistance Dressing Assistance: Maximum assistance     Functional Limitations Info             SPECIAL CARE FACTORS FREQUENCY                       Contractures Contractures Info: Not present    Additional Factors Info  Code Status, Allergies, Psychotropic, Insulin Sliding Scale, Isolation Precautions Code Status Info: Full Allergies Info: Lisinopril Psychotropic Info: Ativan, Zoloft Insulin Sliding Scale Info: insulin aspart (novoLOG) injection 0-9 Units Isolation Precautions Info: ESBL, MRSA     Current Medications (11/26/2015):  This is the current hospital active medication list Current Facility-Administered Medications  Medication Dose Route Frequency Provider Last Rate Last Dose  . acetaminophen (TYLENOL) tablet 650 mg  650 mg Oral Q6H PRN Gwenyth BenderKaren M Black, NP       Or  . acetaminophen (TYLENOL) suppository 650 mg  650 mg Rectal Q6H PRN Gwenyth BenderKaren M Black, NP      . apixaban Everlene Balls(ELIQUIS) tablet 5 mg  5 mg Oral BID Gwenyth BenderKaren M Black, NP   5 mg at 11/26/15 0949  . aspirin EC tablet 81 mg  81 mg Oral Daily Richarda OverlieNayana Abrol, MD   81 mg at 11/26/15 0949  . atorvastatin (LIPITOR) tablet 40 mg  40 mg Oral q1800 Gwenyth BenderKaren M Black, NP      .  ceFEPIme (MAXIPIME) 2 g in dextrose 5 % 50 mL IVPB  2 g Intravenous Q12H Marquita Palms, RPH   2 g at 11/26/15 6701  . ibuprofen (ADVIL,MOTRIN) tablet 400 mg  400 mg Oral Q6H PRN Lesle Chris Black, NP      . insulin aspart (novoLOG) injection 0-9 Units  0-9 Units Subcutaneous TID WC Rodolph Bong, MD      . LORazepam (ATIVAN) injection 1 mg  1 mg Intravenous Once Richarda Overlie, MD      . ondansetron (ZOFRAN) tablet 4 mg  4 mg Oral Q6H PRN Gwenyth Bender, NP       Or  . ondansetron [facility]) injection 4 mg  4 mg Intravenous Q6H PRN Lesle Chris Black,  NP      . polyethylene glycol (MIRALAX / GLYCOLAX) packet 17 g  17 g Oral Daily PRN Gwenyth Bender, NP      . sertraline (ZOLOFT) tablet 75 mg  75 mg Oral Daily Gwenyth Bender, NP   75 mg at 11/26/15 0949  . sodium chloride flush (NS) 0.9 % injection 3 mL  3 mL Intravenous Q12H Gwenyth Bender, NP   3 mL at 11/26/15 0952  . vancomycin (VANCOCIN) 500 mg in sodium chloride 0.9 % 100 mL IVPB  500 mg Intravenous Q12H Marquita Palms, RPH   500 mg at 11/26/15 1259     Discharge Medications: Please see discharge summary for a list of discharge medications.  Relevant Imaging Results:  Relevant Lab Results:   Additional Information SSN: [id number] [place], [place]

## 2015-11-26 NOTE — Evaluation (Signed)
Speech Language Pathology Evaluation Patient Details Name: Rebecca [name] MRN: [id number] DOB: 12/19/1945 Today's Date: 11/26/2015 Time: [id number] SLP Time Calculation (min) (ACUTE ONLY): 13 min  Problem List:  Patient Active Problem List   Diagnosis Date Noted  . Acute encephalopathy 11/25/2015  . Hyperkalemia 11/25/2015  . HCAP (healthcare-associated pneumonia) 11/25/2015  . Pneumonia 11/25/2015  . 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 Systolic and diastolic CHF, chronic (HCC) 07/26/2015  . Anemia, chronic disease 07/23/2015  . Hematochezia 07/23/2015  . Lower GI bleed 07/18/2015  . Late effects of CVA (cerebrovascular accident) 07/06/2015  . UTI (urinary tract infection) 04/18/2015  . E. coli UTI 04/05/2015  . 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 Dysphagia S/P CVA (cerebrovascular accident) 04/22/2014  . 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 Global aphasia   . Paroxysmal atrial fibrillation (HCC)   . Hyperlipidemia   . Pulmonary hypertension (HCC)   . Elevated troponin 04/01/2014   Past Medical History:  Past Medical History:  Diagnosis Date  . Atrial fibrillation (HCC) 05/01/2013  . Depression   . Diabetes mellitus without complication (HCC)   . Expressive aphasia 03/23/2013  . 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 Fabry's disease (HCC)   . Hypertension   . Stroke (HCC)    2015  . Stroke [facility](HCC) 2016   aphasia  . Tobacco abuse 05/01/2013   Past Surgical History:  Past Surgical History:  Procedure Laterality Date  . CESAREAN SECTION     x3  . hip replacement Right 1990's  . hip replacemet Left 1990's  . LAPAROSCOPIC GASTROSTOMY N/A 04/20/2014   Procedure: LAPAROSCOPIC GASTROSTOMY TUBE PLACEMENT;  Surgeon: Axel FillerArmando Ramirez, MD;  Location: [facility] OR;  Service: General;  Laterality: N/A;  . PEG tube placemnt  04/15/14  . TEE WITHOUT  CARDIOVERSION N/A 03/26/2013   Procedure: TRANSESOPHAGEAL ECHOCARDIOGRAM (TEE);  Surgeon: Thurmon FairMihai Croitoru, MD;  [facility];  Service: Cardiovascular;  Laterality: N/A;  . TOOTH EXTRACTION Right 1/17   lower canine   HPI:  70 year old female with complicated past medical history significant for CVA (with severe residual deficits rendering her aphasic and minimally interactive in SNF), DM, Fabry disease, and mixed CHF. She presents 9/7 with R facial droop and AMS with CT head negative. CXR with questionable hazy opacification in RLL. Since admission she has been experiencing periods of apnea and bradycardia. Pt with h/o dysphagia from prior CVA with MBS 03/2014 recommending Dys 1 diet and nectar thick liquids due to silent aspiration of thin liquids. Pt did not advance diet while on CIR, which appears to be in part due to unwillingness to try advanced textures. Pt was re-evaluated at bedside by SLP  in 07/2015 wtih recommendations for Dys 3 diet and thin liquids.   Assessment / Plan / Recommendation Clinical Impression  Pt has minimal verbal output despite prompting throughout evaluation, although she does become more interactive as her arousal improves. She still needs Max cues for completion of simple, one-step commands and she answers "no" to all basic, biographical yes/no questions. She does state her first name, but then she perseverates on this. Per son's report, she is aphasic at baseline but is able to follow commands, respond to yes/no questions appropriately, and produce functional speech (although it is not fluent or conversational). Given her acute changes in cognitive-linguistic function, pt will need additional SLP f/u to maximize functional communication.    SLP Assessment  Patient needs continued Speech Lanaguage Pathology Services    Follow Up Recommendations  Skilled Nursing facility    Frequency and Duration min 2x/week  2 weeks      SLP Evaluation Prior Functioning   Cognitive/Linguistic Baseline: Baseline deficits Baseline deficit details: pt aphasic from prior CVA - son reports she can typically follow one-step commands and has functional verbal output, although it is not conversational Type of Home: Skilled Nursing Facility   Cognition  Overall Cognitive Status: Impaired/Different from baseline Arousal/Alertness: Awake/alert Orientation Level: Oriented to person Attention: Sustained Sustained Attention: Impaired Sustained Attention Impairment: Verbal basic;Functional basic    Comprehension  Auditory Comprehension Overall Auditory Comprehension: Impaired Yes/No Questions: Impaired Basic Biographical Questions: 26-50% accurate (answers "no" to everything) Commands: Impaired One Step Basic Commands: 0-24% accurate    Expression Expression Primary Mode of Expression: Verbal Verbal Expression Overall Verbal Expression: Impaired Initiation: Impaired Automatic Speech: Name (stated first name only)   Oral / Motor  Motor Speech Overall Motor Speech:  (difficult to assess, limited verbal output)   GO                    Maxcine Ham 11/26/2015, 10:06 AM  Maxcine Ham, M.A. CCC-SLP [phone]

## 2015-11-26 NOTE — Plan of Care (Signed)
Problem: Coping: Goal: Ability to verbalize positive feelings about self will improve Outcome: Not Progressing Patient has expressive aphasia and is unable to communicate feelings at this time. Per son, this is patient's baseline.

## 2015-11-26 NOTE — Progress Notes (Signed)
PROGRESS NOTE    Rebecca [name]  AOZ:[id number] DOB: 1945/12/20 DOA: 11/25/2015 PCP: Merrilee Seashore, MD   Brief Narrative:  Rebecca [name] is a 70 y.o. 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 fib on our requests, hypertension, diabetes type 2, hyperlipidemia, systolic and diastolic heart failure, since emergency department from a nursing facility with the chief complaint of altered mental status. Initial evaluation concerning for sepsis related to healthcare associated pneumonia and elevated troponin concerning for ACS versus demand ischemia  Information is obtained from the staff and the chart as the patient remains somewhat encephalopathic. Ports indicate patient found unresponsive this morning. Poorly patient was at her baseline mental state at bedtime last night which is follow commands but mostly nonverbal. She was awakened this morning staff noted her leaning to the right not following commands. She was sent to the emergency department for acute encephalopathy. No report of any recent fever chills diarrhea vomiting. She is mostly bedbound in the facility and nonverbal.     Assessment & Plan:   Principal Problem:   Acute encephalopathy Active Problems:   Elevated troponin   Paroxysmal atrial fibrillation (HCC)   Hyperlipidemia   Apraxia following CVA (cerebrovascular accident)   Type II diabetes mellitus with neurological manifestations (HCC)   Benign hypertensive heart disease without heart failure   Anemia, chronic disease   Systolic and diastolic CHF, chronic (HCC)   Fabry disease (HCC)   Hyperkalemia   HCAP (healthcare-associated pneumonia)  #1 acute encephalopathy Likely metabolic in nature secondary to healthcare associated pneumonia versus aspiration pneumonia. Concern for possible CVA as patient on admission per notes was noted to have a right facial droop  from nursing facility. Concern for also possible seizures as patient was noted to be apneic and bradycardic overnight without hypotension. Patient with clinical improvement and likely close to baseline per son. Patient has been pancultured results pending. CT head with no acute abnormalities. MRI head pending. EEG pending. Continue empiric IV vancomycin and IV cefepime. Follow.  #2 healthcare associated pneumonia versus aspiration pneumonia Per chest x-ray. Patient was on regular diet with thin liquids prior to admission. Patient has been assessed by speech therapy were recommended a dysphagia 1 diet with nectar thick liquids. Lactic acid levels trending down. Sputum Gram stain and cultures pending. Urine strep pneumococcus antigen is negative. Urine Legionella antigen pending. Continue empiric IV vancomycin and IV cefepime. Follow.  #3 chronic systolic and diastolic CHF Stable.  #4 elevated troponin Felt to likely be a demand ischemia in the setting of pneumonia. Patient has been assessed per cardiology and it was felt no ischemic evaluation needed at this time. Cards following.  #5 paroxysmal atrial fibrillation CHA2DS2vASC = 6 Patient in normal sinus rhythm. Patient noted to have periods of apnea with bradycardia overnight and as such beta blocker on hold. Continue ELIQUIS for anticoagulation.  #6 history of CVA Continue eliquis for secondary stroke prevention. Continue Lipitor.  #7 Fabry's disease  Outpatient follow-up.  #8 chronic systolic and diastolic heart failure Stable. Follow.  #9 type 2 diabetes mellitus Hemoglobin A1c was 5.28 April 2015. Check CBGs before meals and at bedtime. Place on sliding scale insulin.    DVT prophylaxis: eliquis Code Status: Full code for now. Son to discuss with his sister in terms of CODE STATUS. Family Communication: Updated son at bedside. Disposition Plan: Remain inpatient is step down unit. Back to skilled nursing facility when medically  stable and  back to baseline.   Consultants:   PCCM: Dr Isaiah SergeMannam 11/25/2015  Cardiology: Dr. Clifton JamesMcAlhany 11/25/2015  Procedures:   EEG 11/26/2015  MRI pending  Chest x-ray 11/25/2015  CT head 11/25/2015  Antimicrobials:   IV vancomycin 11/25/2015  IV cefepime 11/25/2015   Subjective: Patient alert although aphasic. Patient following some commands. Patient tracking. Per son patient seems much better than she did on admission. Patient noted to have some apneic episodes with bradycardia with heart rates in the 30s overnight.  Objective: Vitals:   11/26/15 0400 11/26/15 0500 11/26/15 0800 11/26/15 0834  BP: (!) 125/92 (!) 143/86 (!) 145/86   Pulse: 64 65 69   Resp: (!) 23 13 (!) 25   Temp: 98.7 F (37.1 C)   (!) 96.9 F (36.1 C)  TempSrc: Oral   Axillary  SpO2: 100% 100% 93%   Weight:        Intake/Output Summary (Last 24 hours) at 11/26/15 1026 Last data filed at 11/26/15 0700  Gross per 24 hour  Intake             2050 ml  Output                2 ml  Net             2048 ml   Filed Weights   11/25/15 1311  Weight: 62.6 kg (138 lb)    Examination:  General exam: Appears calm and comfortable.Alert Respiratory system: Clear to auscultation anterior lung fields. Respiratory effort normal. Cardiovascular system: S1 & S2 heard, RRR. No JVD, murmurs, rubs, gallops or clicks. No pedal edema. Gastrointestinal system: Abdomen is nondistended, soft and nontender. No organomegaly or masses felt. Normal bowel sounds heard. Central nervous system: Alert and aphasic however following commands and tracking appropriately.  Extremities: Symmetric 5 x 5 power. Skin: No rashes, lesions or ulcers Psychiatry: Judgement and insight appear normal. Mood & affect appropriate.     Data Reviewed: I have personally reviewed following labs and imaging studies  CBC:  Recent Labs Lab 11/25/15 1050 11/25/15 1100 11/26/15 0256  WBC 3.5*  --  3.5*  NEUTROABS 2.0  --   --   HGB 10.8*  12.6 9.2*  HCT 36.2 37.0 29.7*  MCV 83.2  --  80.7  PLT 281  --  168   Basic Metabolic Panel:  Recent Labs Lab 11/25/15 1100 11/25/15 1110 11/26/15 0256  NA 141 143 143  K 7.4* 4.2 5.1  CL 109 110 112*  CO2  --  23 22  GLUCOSE 94 99 89  BUN 26* 17 14  CREATININE 1.00 0.97 0.88  CALCIUM  --  9.2 8.4*   GFR: Estimated Creatinine Clearance: 50.6 mL/min (by C-G formula based on SCr of 0.88 mg/dL). Liver Function Tests:  Recent Labs Lab 11/25/15 1110  AST 31  ALT 13*  ALKPHOS 37*  BILITOT 0.9  PROT 7.7  ALBUMIN 3.2*   No results for input(s): LIPASE, AMYLASE in the last 168 hours. No results for input(s): AMMONIA in the last 168 hours. Coagulation Profile:  Recent Labs Lab 11/25/15 1110  INR 1.64   Cardiac Enzymes:  Recent Labs Lab 11/25/15 1517 11/25/15 2005  TROPONINI 0.19* 0.19*   BNP (last 3 results) No results for input(s): PROBNP in the last 8760 hours. HbA1C: No results for input(s): HGBA1C in the last 72 hours. CBG: No results for input(s): GLUCAP in the last 168 hours. Lipid Profile: No results for input(s): CHOL, HDL, LDLCALC, TRIG, CHOLHDL,  LDLDIRECT in the last 72 hours. Thyroid Function Tests: No results for input(s): TSH, T4TOTAL, FREET4, T3FREE, THYROIDAB in the last 72 hours. Anemia Panel:  Recent Labs  11/25/15 1517 11/26/15 0816  VITAMINB12 1,456* 1,214*  FOLATE  --  14.2  FERRITIN  --  117  TIBC  --  333  IRON  --  39  RETICCTPCT  --  1.5   Sepsis Labs:  Recent Labs Lab 11/25/15 1134 11/25/15 1517 11/25/15 1630 11/26/15 0816  LATICACIDVEN 3.26* 2.8* 2.7* 1.5    Recent Results (from the past 240 hour(s))  Culture, blood (Routine X 2) w Reflex to ID Panel     Status: None (Preliminary result)   Collection Time: 11/25/15 12:05 PM  Result Value Ref Range Status   Specimen Description BLOOD RIGHT ANTECUBITAL  Final   Special Requests IN PEDIATRIC BOTTLE  1CC  Final   Culture  Setup Time   Final    GRAM POSITIVE  COCCI IN CLUSTERS AEROBIC BOTTLE ONLY Organism ID to follow    Culture GRAM POSITIVE COCCI  Final   Report Status PENDING  Incomplete  Culture, blood (Routine X 2) w Reflex to ID Panel     Status: None (Preliminary result)   Collection Time: 11/25/15 12:28 PM  Result Value Ref Range Status   Specimen Description BLOOD RIGHT HAND  Final   Special Requests IN PEDIATRIC BOTTLE  1CC  Final   Culture NO GROWTH < 24 HOURS  Final   Report Status PENDING  Incomplete  Gram stain     Status: None   Collection Time: 11/25/15 12:43 PM  Result Value Ref Range Status   Specimen Description URINE, CATHETERIZED  Final   Special Requests NONE  Final   Gram Stain   Final    WBC PRESENT,BOTH PMN AND MONONUCLEAR GRAM POSITIVE RODS GRAM POSITIVE COCCI IN CLUSTERS GRAM NEGATIVE RODS CYTOSPIN SMEAR    Report Status 11/25/2015 FINAL  Final  MRSA PCR Screening     Status: None   Collection Time: 11/25/15  2:52 PM  Result Value Ref Range Status   MRSA by PCR NEGATIVE NEGATIVE Final    Comment:        The GeneXpert MRSA Assay (FDA approved for NASAL specimens only), is one component of a comprehensive MRSA colonization surveillance program. It is not intended to diagnose MRSA infection nor to guide or monitor treatment for MRSA infections.          Radiology Studies: Ct Head Wo Contrast  Result Date: 11/25/2015 CLINICAL DATA:  Right-sided facial droop EXAM: CT HEAD WITHOUT CONTRAST TECHNIQUE: Contiguous axial images were obtained from the base of the skull through the vertex without intravenous contrast. COMPARISON:  October 11, 2015 FINDINGS: Brain: Mild diffuse atrophy remain stable. There is no intracranial mass, hemorrhage, extra-axial fluid collection, or midline shift. There is extensive small vessel disease throughout the centra semiovale bilaterally. Small vessel disease is noted in each thalamus. There is evidence of a prior infarct in the superior posterior left frontal lobe. There is  evidence of a prior infarct on the right at the parieto-occipital junction. There is evidence of a prior small infarct in the posterior superior right cerebellum. No new gray-white compartment lesions are evident. No acute infarct is demonstrable compared to prior study. Vascular: There is no hyperdense vessel appreciable. There are focal areas of calcification in the carotid siphons bilaterally as well as in the distal vertebral artery bilaterally and basilar artery regions. Skull: The bony calvarium  appears intact. Sinuses/Orbits: Orbits appear symmetric bilaterally. There is opacification in the left sphenoid sinus. Other visualized paranasal sinuses are clear. Other: Mastoid air cells are clear. IMPRESSION: Atrophy with prior infarcts and extensive small vessel disease, stable. No acute infarct evident. No intracranial mass, hemorrhage, or extra-axial fluid collection. There are multiple foci of arterial vascular calcification. There is sphenoid sinus opacification. Electronically Signed   By: Bretta Bang III M.D.   On: 11/25/2015 11:28   Dg Chest Port 1 View  Result Date: 11/25/2015 CLINICAL DATA:  Weakness, altered mental status, history of diabetes, atrial fibrillation, previous CVA, current smoker. EXAM: PORTABLE CHEST 1 VIEW COMPARISON:  Chest x-ray of April 02, 2014 FINDINGS: The lungs are adequately inflated. There is increased density at the right lung base. There is no pleural effusion. The cardiac silhouette is enlarged but stable. The pulmonary vascularity is normal. There is calcification in the wall of the thoracic aorta. The bony thorax exhibits no acute abnormality. IMPRESSION: Right lower lobe atelectasis or pneumonia. Followup PA and lateral chest X-ray is recommended in 3-4 weeks following trial of antibiotic therapy to ensure resolution and exclude underlying malignancy. Stable cardiomegaly without pulmonary vascular congestion. Aortic atherosclerosis. Electronically Signed   By:  David  [place] M.D.   On: 11/25/2015 11:04        Scheduled Meds: . apixaban  5 mg Oral BID  . aspirin EC  81 mg Oral Daily  . atorvastatin  40 mg Oral q1800  . ceFEPime (MAXIPIME) IV  2 g Intravenous Q12H  . LORazepam  1 mg Intravenous Once  . sertraline  75 mg Oral Daily  . sodium chloride flush  3 mL Intravenous Q12H  . vancomycin  500 mg Intravenous Q12H   Continuous Infusions:    LOS: 1 day    Time spent: 40 minutes    Monish Haliburton, MD [facility] Pager [phone]  If 7PM-7AM, please contact night-coverage [email or web address] Password [email or web address] 11/26/2015, 10:26 AM

## 2015-11-26 NOTE — Progress Notes (Signed)
CCMD notified this RN of pt having 6-7 beat runs of PVC/V-Tach, pt VSS, Tele Strip shown to TRH/MD

## 2015-11-26 NOTE — Clinical Social Work Note (Signed)
Clinical Social Work Assessment  Patient Details  Name: Rebecca [name] MRN: [id number] Date of Birth: Mar 24, 1945  Date of referral:  11/26/15               Reason for consult:  Facility Placement                Permission sought to share information with:  Facility Medical sales representative, Family Supports Permission granted to share information::  No (Patient disoriented; completed assessment w/ son)  Name::     Rebecca [name]  Agency::  [facility]  Relationship::  Son  Solicitor Information:  [phone]  Housing/Transportation Living arrangements for the past 2 months:  Skilled Nursing Facility Source of Information:  Adult Children Patient Interpreter Needed:  None Criminal Activity/Legal Involvement Pertinent to Current Situation/Hospitalization:  No - Comment as needed Significant Relationships:  Adult Children Lives with:  Facility Resident Do you feel safe going back to the place where you live?  Yes Need for family participation in patient care:  Yes (Comment)  Care giving concerns:  CSW received consult regarding return to SNF. Patient is disoriented. CSW spoke with patient's son, Rebecca [name]. Patient is from [facility] and will return there at discharge. CSW to continue to follow for needs.   Social Worker assessment / plan:  CSW spoke with patient's son regarding discharge planning process.   Employment status:  Retired Health and safety inspector:  Medicare PT Recommendations:  Not assessed at this time Information / Referral to community resources:  Skilled Nursing Facility  Patient/Family's Response to care:  Patient's son expressed hope that patient can return to SNF soon.  Patient/Family's Understanding of and Emotional Response to Diagnosis, Current Treatment, and Prognosis:  Patient/family is realistic regarding therapy needs and expressed being hopeful for SNF placement. Patient's son expressed understanding of CSW role and discharge process. No questions/concerns about plan  or treatment.    Emotional Assessment Appearance:  Appears stated age Attitude/Demeanor/Rapport:  Unable to Assess Affect (typically observed):  Unable to Assess Orientation:  Oriented to Self Alcohol / Substance use:  Not Applicable Psych involvement (Current and /or in the community):  No (Comment)  Discharge Needs  Concerns to be addressed:  No discharge needs identified Readmission within the last 30 days:  No Current discharge risk:  None Barriers to Discharge:  Continued Medical Work up   [employer], Theresia Majors 11/26/2015, 5:37 PM

## 2015-11-26 NOTE — Progress Notes (Signed)
This RN traveled to MRI with pt, pt with PVCs/V-Tach unable to travel without RN

## 2015-11-26 NOTE — Progress Notes (Addendum)
     SUBJECTIVE: Pt is non verbal. Several episodes of apnea overnight with bradycardia.   Tele: sinus, rate 60 bpm.   BP (!) 125/92 (BP Location: Left Arm)   Pulse 64   Temp 98.7 F (37.1 C) (Oral)   Resp (!) 23   Wt 138 lb (62.6 kg)   SpO2 100%   BMI 23.69 kg/m   Intake/Output Summary (Last 24 hours) at 11/26/15 0535 Last data filed at 11/26/15 0400  Gross per 24 hour  Intake             2000 ml  Output                2 ml  Net             1998 ml    PHYSICAL EXAM General: Non verbal. Opens eyes to voice.   Psych:  Awakens easily Neck: No JVD. No masses noted.  Lungs: Clear bilaterally with no wheezes or rhonci noted.  Heart: RRR with no murmurs noted. Abdomen: Bowel sounds are present. Soft, non-tender.  Extremities: No lower extremity edema.   LABS: Basic Metabolic Panel:  Recent Labs  50/53/97 1110 11/26/15 0256  NA 143 143  K 4.2 5.1  CL 110 112*  CO2 23 22  GLUCOSE 99 89  BUN 17 14  CREATININE 0.97 0.88  CALCIUM 9.2 8.4*   CBC:  Recent Labs  11/25/15 1050 11/25/15 1100 11/26/15 0256  WBC 3.5*  --  3.5*  NEUTROABS 2.0  --   --   HGB 10.8* 12.6 9.2*  HCT 36.2 37.0 29.7*  MCV 83.2  --  80.7  PLT 281  --  168   Cardiac Enzymes:  Recent Labs  11/25/15 1517 11/25/15 2005  TROPONINI 0.19* 0.19*    Current Meds: . apixaban  5 mg Oral BID  . aspirin EC  81 mg Oral Daily  . atorvastatin  40 mg Oral q1800  . ceFEPime (MAXIPIME) IV  2 g Intravenous Q12H  . LORazepam  1 mg Intravenous Once  . sertraline  75 mg Oral Daily  . sodium chloride flush  3 mL Intravenous Q12H  . vancomycin  500 mg Intravenous Q12H     ASSESSMENT AND PLAN: 70 yo Nursing home patient with prior stroke and aphasia admitted with altered mental status. Baseline functional state is poor. Troponin mildly elevated so cardiology consulted.   1. Elevated Troponin: POC troponin 0.22, flat trend at 0.19 x 2. Likely demand ischemia in setting of pneumonia/sepsis. No  ischemic evaluation is planned. Normal LV function by echo in 2016.   2. PAF: Sinus this am. Periods of bradycardia with apnea overnight. She is not a good candidate for a pacemaker. No indication for pacemaker at this time. CHA2DS2-VASc Score is 6 (CHF, DM, Female, Age, Stroke (2)). Continue Eliquis for anticoagulation. Beta blocker on hold due to episodes of bradycardia.    3. Healthcare Associated Pneumonia:Treatment per primary team.   Verne Carrow  9/8/20175:35 AM

## 2015-11-26 NOTE — Procedures (Signed)
TECHNICAL SUMMARY:  A multichannel referential and bipolar montage EEG using the standard international 10-20 system was performed on the patient described as somnolent.  The dominant background activity consists of 8 hertz activity seen most prominantly over the posterior head region.   2-5 hertz activity is seen overriding in the frontal regions.  Low voltage fast (beta) activity is distributed symmetrically and maximally over the anterior head regions.  ACTIVATION:  Stepwise photic stimulation and HV were not performed.  EPILEPTIFORM ACTIVITY:  There were no spikes, sharp waves or paroxysmal activity.  SLEEP:  No sleep  IMPRESSION:  This is an abnormal EEG demonstrating a mild diffuse slowing of electrocerebral activity.  This can be seen in a wide variety of encephalopathic state including those of a toxic, metabolic, or degenerative nature.  There were no focal, hemispheric, or lateralizing features.  No epileptiform activity was recorded.

## 2015-11-26 NOTE — Progress Notes (Signed)
  PHARMACY - PHYSICIAN COMMUNICATION CRITICAL VALUE ALERT - BLOOD CULTURE IDENTIFICATION (BCID)  Blood cx is 1/2 with GPC in clusters. BCID result was reported as invalid. Most likely due to running the pediatric bottle which may not have enough blood for the test. Most likely contaminant unless other blood cx turns positive as well. Patient already on vancomycin and cefepime for PNA.  Changes to prescribed antibiotics required: Continue cefepime and vancomycin for PNA  Enzo Bi, PharmD, BCPS Clinical Pharmacist Pager [phone] 11/26/2015 3:35 PM

## 2015-11-27 ENCOUNTER — Inpatient Hospital Stay (HOSPITAL_COMMUNITY): Payer: Medicare Other

## 2015-11-27 DIAGNOSIS — R079 Chest pain, unspecified: Secondary | ICD-10-CM

## 2015-11-27 DIAGNOSIS — I69391 Dysphagia following cerebral infarction: Secondary | ICD-10-CM

## 2015-11-27 DIAGNOSIS — I6340 Cerebral infarction due to embolism of unspecified cerebral artery: Secondary | ICD-10-CM | POA: Diagnosis present

## 2015-11-27 DIAGNOSIS — E7521 Fabry (-Anderson) disease: Secondary | ICD-10-CM

## 2015-11-27 LAB — AMMONIA: Ammonia: 37 umol/L — ABNORMAL HIGH (ref 9–35)

## 2015-11-27 LAB — BASIC METABOLIC PANEL
ANION GAP: 8 (ref 5–15)
BUN: 15 mg/dL (ref 6–20)
CO2: 23 mmol/L (ref 22–32)
Calcium: 8.8 mg/dL — ABNORMAL LOW (ref 8.9–10.3)
Chloride: 113 mmol/L — ABNORMAL HIGH (ref 101–111)
Creatinine, Ser: 0.91 mg/dL (ref 0.44–1.00)
GFR calc Af Amer: >60 mL/min (ref >60)
GLUCOSE: 106 mg/dL — AB (ref 65–99)
POTASSIUM: 3.9 mmol/L (ref 3.5–5.1)
Sodium: 144 mmol/L (ref 135–145)

## 2015-11-27 LAB — GLUCOSE, CAPILLARY
GLUCOSE-CAPILLARY: 96 mg/dL (ref 65–99)
GLUCOSE-CAPILLARY: 142 mg/dL — AB (ref 65–99)
Glucose-Capillary: 97 mg/dL (ref 65–99)
Glucose-Capillary: 124 mg/dL — ABNORMAL HIGH (ref 65–99)

## 2015-11-27 LAB — ECHOCARDIOGRAM COMPLETE
CHL CUP DOP CALC LVOT VTI: 13.3 cm
CHL CUP MV DEC (S): 194
CHL CUP RV SYS PRESS: 61 mmHg
E decel time: 194 msec
E/e' ratio: 31.12
FS: 10 % — AB (ref 28–44)
IVS/LV PW RATIO, ED: 1.31
LA ID, A-P, ES: 50 mm
LA diam end sys: 50 mm
LA vol A4C: 187 ml
LA vol index: 97.6 mL/m2
LADIAMINDEX: 2.99 cm/m2
LAVOL: 163 mL
LV E/e' medial: 31.12
LV PW d: 20.8 mm — AB (ref 0.6–1.1)
LV TDI E'LATERAL: 3.21
LV e' LATERAL: 3.21 cm/s
LVEEAVG: 31.12
LVOT SV: 30 mL
LVOT area: 2.27 cm2
LVOT diameter: 17 mm
LVOT peak vel: 80.1 cm/s
MRPISAEROA: 0.12 cm2
MV Peak grad: 4 mmHg
MVPKEVEL: 99.9 m/s
P 1/2 time: 609 ms
RV TAPSE: 12.2 mm
Reg peak vel: 321 cm/s
TDI e' medial: 2.08
TR max vel: 321 cm/s
VTI: 184 cm
WEIGHTICAEL: 2208 [oz_av]

## 2015-11-27 LAB — LIPID PANEL
CHOL/HDL RATIO: 2.6 ratio
Cholesterol: 133 mg/dL (ref 0–200)
HDL: 52 mg/dL (ref >40)
LDL Cholesterol: 70 mg/dL (ref 0–99)
TRIGLYCERIDES: 54 mg/dL (ref <150)
VLDL: 11 mg/dL (ref 0–40)

## 2015-11-27 LAB — CBC
HEMATOCRIT: 29.1 % — AB (ref 36.0–46.0)
HEMOGLOBIN: 9 g/dL — AB (ref 12.0–15.0)
MCH: 24.9 pg — ABNORMAL LOW (ref 26.0–34.0)
MCHC: 30.9 g/dL (ref 30.0–36.0)
MCV: 80.4 fL (ref 78.0–100.0)
Platelets: 213 10*3/uL (ref 150–400)
RBC: 3.62 MIL/uL — ABNORMAL LOW (ref 3.87–5.11)
RDW: 19.5 % — ABNORMAL HIGH (ref 11.5–15.5)
WBC: 3.2 10*3/uL — AB (ref 4.0–10.5)

## 2015-11-27 MED ORDER — IOPAMIDOL (ISOVUE-370) INJECTION 76%
INTRAVENOUS | Status: AC
  Filled 2015-11-27: qty 50

## 2015-11-27 NOTE — Progress Notes (Signed)
PROGRESS NOTE    Rebecca [name]  UJW:[id number] DOB: 1945-06-19 DOA: 11/25/2015 PCP: Merrilee Seashore, MD   Brief Narrative:  Rebecca [name] is a 70 y.o. 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 fib on our requests, hypertension, diabetes type 2, hyperlipidemia, systolic and diastolic heart failure, since emergency department from a nursing facility with the chief complaint of altered mental status. Initial evaluation concerning for sepsis related to healthcare associated pneumonia and elevated troponin concerning for ACS versus demand ischemia  Information is obtained from the staff and the chart as the patient remains somewhat encephalopathic. Ports indicate patient found unresponsive this morning. Poorly patient was at her baseline mental state at bedtime last night which is follow commands but mostly nonverbal. She was awakened this morning staff noted her leaning to the right not following commands. She was sent to the emergency department for acute encephalopathy. No report of any recent fever chills diarrhea vomiting. She is mostly bedbound in the facility and nonverbal. Patient placed empirically on IV antibiotics of vancomycin and cefepime for healthcare associated pneumonia versus aspiration pneumonia. Patient with clinical improvement. MRI of the head which was done was consistent with acute infarction. Stroke workup underway. Neurology consulted.     Assessment & Plan:   Principal Problem:   Acute encephalopathy Active Problems:   Cerebral embolism with cerebral infarction   HCAP (healthcare-associated pneumonia)   Elevated troponin   Global aphasia   Paroxysmal atrial fibrillation (HCC)   Hyperlipidemia   Apraxia following CVA (cerebrovascular accident)   Dysphagia S/P CVA (cerebrovascular accident)   Type II diabetes mellitus with neurological manifestations (HCC)   Benign hypertensive heart disease without heart failure   Anemia, chronic disease   Systolic and diastolic CHF, chronic (HCC)   Fabry disease (HCC)   Hyperkalemia  #1 acute encephalopathy Likely multifactorial secondary to acute infarcts noted on MRI head and also metabolic in nature secondary to healthcare associated pneumonia versus aspiration pneumonia. Patient noted on admission to have a right facial droop from the nursing facility. Concern for also possible seizures as patient was noted to be apneic and bradycardic overnight without hypotension. EEG which was done demonstrated mild diffuse slowing of electrocerebral activity. No epileptiform activity was recorded. Patient with clinical improvement and likely close to baseline per son yesterday. Patient has been pancultured results pending. CT head with no acute abnormalities. MRI head with 3- 4 scattered punctuate acute infarctions in the left MCA territory consistent with microembolic infarctions. No large infarction. No swelling or acute hemorrhage. Old cortical infarct affecting both cerebral hemispheres noted. Extensive chronic small vessel ischemic changes elsewhere throughout the brain. MRA of the head pending. 2-D echo pending. Carotid Dopplers pending. Continue eliquis for secondary stroke prevention. PT/OT /ST. Neurology has been consulted and following. Continue empiric IV vancomycin and IV cefepime. Follow.  #2 acute CVA  MRI head with 3- 4 scattered punctuate acute infarctions in the left MCA territory consistent with microembolic infarctions. No large infarction. No swelling or acute hemorrhage. Old cortical infarct affecting both cerebral hemispheres noted. Extensive chronic small vessel ischemic changes elsewhere throughout the brain. MRA head pending. 2-D echo pending. Carotid Dopplers pending. PT/OT/ST. Continue eliquis for secondary stroke prevention. Neurology consulted and following.   #3 healthcare associated pneumonia  versus aspiration pneumonia Per chest x-ray. Patient was on regular diet with thin liquids prior to admission. Patient has been assessed by speech therapy and recommended a dysphagia 1 diet  with nectar thick liquids. Lactic acid levels trending down. Sputum Gram stain and cultures pending. Urine strep pneumococcus antigen is negative. Urine Legionella antigen pending. Continue empiric IV vancomycin and IV cefepime. Follow.  #4 chronic systolic and diastolic CHF Stable. 2-D echo pending. Beta blocker on hold secondary to bradycardia. Patient with allergy to ace inhibitors. Continue Lipitor, aspirin,eliquis.  #5 elevated troponin Felt to likely be a demand ischemia in the setting of pneumonia. 2-D echo pending. Patient has been assessed per cardiology and it was felt no ischemic evaluation needed at this time. Cards following.  #6 paroxysmal atrial fibrillation CHA2DS2vASC = 6 Patient in normal sinus rhythm. Patient noted to have periods of apnea with bradycardia 2 nights ago, and as such beta blocker on hold. 2-D echo pending. Continue ELIQUIS for anticoagulation.  #7 history of CVA Continue eliquis for secondary stroke prevention. Continue Lipitor.  #8 Fabry's disease  Outpatient follow-up.  #9 chronic systolic and diastolic heart failure Stable. 2-D echo pending. Follow.  #10.  type 2 diabetes mellitus Hemoglobin A1c was 5.28 April 2015. CBGs 97-119. Continue sliding scale insulin.     DVT prophylaxis: eliquis Code Status: Full code for now. Son to discuss with his sister in terms of CODE STATUS. Family Communication: No family at bedside.  Disposition Plan: Remain inpatient is step down unit. Back to skilled nursing facility when medically stable and back to baseline.   Consultants:   PCCM: Dr Isaiah Serge 11/25/2015  Cardiology: Dr. Clifton James 11/25/2015  Neurology: Dr. Roseanne Reno 11/26/2015  Procedures:   EEG 11/26/2015  MRI 11/26/2015  Chest x-ray 11/25/2015  CT head  11/25/2015  2-D echo pending 11/27/2015  Antimicrobials:   IV vancomycin 11/25/2015  IV cefepime 11/25/2015   Subjective: Patient alert although aphasic. Patient following some commands. Patient tracking.   Objective: Vitals:   11/27/15 0005 11/27/15 0400 11/27/15 0753 11/27/15 0800  BP: (!) 140/97 (!) 151/105 (!) 144/100 (!) 136/104  Pulse: 76  66 65  Resp: (!) 25 15 (!) 29 15  Temp: 98 F (36.7 C)   98.8 F (37.1 C)  TempSrc: Oral   Oral  SpO2: 100% 100% 94% 100%  Weight:        Intake/Output Summary (Last 24 hours) at 11/27/15 0953 Last data filed at 11/27/15 0600  Gross per 24 hour  Intake              660 ml  Output                0 ml  Net              660 ml   Filed Weights   11/25/15 1311  Weight: 62.6 kg (138 lb)    Examination:  General exam: Appears calm and comfortable.Alert Respiratory system: Clear to auscultation anterior lung fields. Respiratory effort normal. Cardiovascular system: S1 & S2 heard, RRR. No JVD, murmurs, rubs, gallops or clicks. No pedal edema. Gastrointestinal system: Abdomen is nondistended, soft and nontender. No organomegaly or masses felt. Normal bowel sounds heard. Central nervous system: Alert and aphasic however following commands and tracking appropriately.  Extremities: Symmetric 5 x 5 power. Skin: No rashes, lesions or ulcers Psychiatry: Judgement and insight appear normal. Mood & affect appropriate.     Data Reviewed: I have personally reviewed following labs and imaging studies  CBC:  Recent Labs Lab 11/25/15 1050 11/25/15 1100 11/25/15 1516 11/26/15 0256 11/27/15 0534  WBC 3.5*  --   --  3.5* 3.2*  NEUTROABS 2.0  --   --   --   --  HGB 10.8* 12.6  --  9.2* 9.0*  HCT 36.2 37.0 31.8* 29.7* 29.1*  MCV 83.2  --   --  80.7 80.4  PLT 281  --   --  168 213   Basic Metabolic Panel:  Recent Labs Lab 11/25/15 1100 11/25/15 1110 11/26/15 0256 11/27/15 0534  NA 141 143 143 144  K 7.4* 4.2 5.1 3.9  CL  109 110 112* 113*  CO2  --  23 22 23   GLUCOSE 94 99 89 106*  BUN 26* 17 14 15   CREATININE 1.00 0.97 0.88 0.91  CALCIUM  --  9.2 8.4* 8.8*   GFR: Estimated Creatinine Clearance: 49 mL/min (by C-G formula based on SCr of 0.91 mg/dL). Liver Function Tests:  Recent Labs Lab 11/25/15 1110  AST 31  ALT 13*  ALKPHOS 37*  BILITOT 0.9  PROT 7.7  ALBUMIN 3.2*   No results for input(s): LIPASE, AMYLASE in the last 168 hours.  Recent Labs Lab 11/27/15 0534  AMMONIA 37*   Coagulation Profile:  Recent Labs Lab 11/25/15 1110  INR 1.64   Cardiac Enzymes:  Recent Labs Lab 11/25/15 1517 11/25/15 2005  TROPONINI 0.19* 0.19*   BNP (last 3 results) No results for input(s): PROBNP in the last 8760 hours. HbA1C: No results for input(s): HGBA1C in the last 72 hours. CBG:  Recent Labs Lab 11/26/15 1309 11/26/15 1743 11/26/15 2105 11/27/15 0827  GLUCAP 93 119* 115* 97   Lipid Profile:  Recent Labs  11/27/15 0534  CHOL 133  HDL 52  LDLCALC 70  TRIG 54  CHOLHDL 2.6   Thyroid Function Tests: No results for input(s): TSH, T4TOTAL, FREET4, T3FREE, THYROIDAB in the last 72 hours. Anemia Panel:  Recent Labs  11/25/15 1517 11/26/15 0816  VITAMINB12 1,456* 1,214*  FOLATE  --  14.2  FERRITIN  --  117  TIBC  --  333  IRON  --  39  RETICCTPCT  --  1.5   Sepsis Labs:  Recent Labs Lab 11/25/15 1134 11/25/15 1517 11/25/15 1630 11/26/15 0816  LATICACIDVEN 3.26* 2.8* 2.7* 1.5    Recent Results (from the past 240 hour(s))  Culture, blood (Routine X 2) w Reflex to ID Panel     Status: Abnormal (Preliminary result)   Collection Time: 11/25/15 12:05 PM  Result Value Ref Range Status   Specimen Description BLOOD RIGHT ANTECUBITAL  Final   Special Requests IN PEDIATRIC BOTTLE  1CC  Final   Culture  Setup Time   Final    GRAM POSITIVE COCCI IN CLUSTERS IN PEDIATRIC BOTTLE CRITICAL RESULT CALLED TO, READ BACK BY AND VERIFIED WITH: N. Batchelder Pharm.D. 14:55  11/26/15 ([email or web address])    Culture (A)  Final    STAPHYLOCOCCUS SPECIES (COAGULASE NEGATIVE) THE SIGNIFICANCE OF ISOLATING THIS ORGANISM FROM A SINGLE SET OF BLOOD CULTURES WHEN MULTIPLE SETS ARE DRAWN IS UNCERTAIN. PLEASE NOTIFY THE MICROBIOLOGY DEPARTMENT WITHIN ONE WEEK IF SPECIATION AND SENSITIVITIES ARE REQUIRED.    Report Status PENDING  Incomplete  Culture, blood (Routine X 2) w Reflex to ID Panel     Status: None (Preliminary result)   Collection Time: 11/25/15 12:28 PM  Result Value Ref Range Status   Specimen Description BLOOD RIGHT HAND  Final   Special Requests IN PEDIATRIC BOTTLE  1CC  Final   Culture NO GROWTH 2 DAYS  Final   Report Status PENDING  Incomplete  Urine culture     Status: None   Collection Time: 11/25/15 12:43 PM  Result Value  Ref Range Status   Specimen Description URINE, CATHETERIZED  Final   Special Requests NONE  Final   Culture NO GROWTH  Final   Report Status 11/26/2015 FINAL  Final  Gram stain     Status: None   Collection Time: 11/25/15 12:43 PM  Result Value Ref Range Status   Specimen Description URINE, CATHETERIZED  Final   Special Requests NONE  Final   Gram Stain   Final    WBC PRESENT,BOTH PMN AND MONONUCLEAR GRAM POSITIVE RODS GRAM POSITIVE COCCI IN CLUSTERS GRAM NEGATIVE RODS CYTOSPIN SMEAR    Report Status 11/25/2015 FINAL  Final  MRSA PCR Screening     Status: None   Collection Time: 11/25/15  2:52 PM  Result Value Ref Range Status   MRSA by PCR NEGATIVE NEGATIVE Final    Comment:        The GeneXpert MRSA Assay (FDA approved for NASAL specimens only), is one component of a comprehensive MRSA colonization surveillance program. It is not intended to diagnose MRSA infection nor to guide or monitor treatment for MRSA infections.          Radiology Studies: Ct Angio Head W Or Wo Contrast  Result Date: 11/27/2015 CLINICAL DATA:  Initial evaluation for acute stroke. EXAM: CT ANGIOGRAPHY HEAD AND NECK TECHNIQUE: Multidetector CT  imaging of the head and neck was performed using the standard protocol during bolus administration of intravenous contrast. Multiplanar CT image reconstructions and MIPs were obtained to evaluate the vascular anatomy. Carotid stenosis measurements (when applicable) are obtained utilizing NASCET criteria, using the distal internal carotid diameter as the denominator. CONTRAST:  50 cc of Isovue 370. COMPARISON:  Prior MRI from 11/26/2015. FINDINGS: CT HEAD Age-related cerebral atrophy present. Extensive chronic microvascular ischemic changes again seen. Scattered areas of encephalomalacia involving fourth cerebral hemispheres compatible with remote infarcts. Remote bilateral cerebellar infarcts present. Prominent vascular calcifications within the carotid siphons and vertebrobasilar system. Known small punctate acute infarcts not seen. No other acute large vessel territory infarct. No acute intracranial hemorrhage. No mass lesion, midline shift, or mass effect. No hydrocephalus. No extra-axial fluid collection. Scalp soft tissues demonstrate no acute abnormality. No acute abnormality about the globes and orbits. The opacification with expansion of the left sphenoid sinus, likely related to mucocele. Paranasal sinuses are otherwise clear. No mastoid effusion. Calvarium intact. CTA NECK Aortic arch: Study limited by motion artifact. Visualized aortic arch of normal caliber with normal branch pattern. No high-grade stenosis at the origin of the great vessels. Scattered atheromatous plaque within the arch itself. Visualized subclavian arteries patent. Right carotid system: Right common carotid artery patent from its origin to the bifurcation without stenosis. Vessel tortuosity noted. Mild plaque about the right bifurcation without stenosis. Right ICA widely patent to the skullbase without stenosis, dissection, or occlusion. Left carotid system: Left common carotid artery patent from its origin to the bifurcation without  high-grade stenosis. Mild plaque about the left bifurcation/ proximal left ICA without high-grade stenosis. Left ICA mildly tortuous but patent distally to the skullbase without stenosis, dissection, or occlusion. Vertebral arteries:Both vertebral arteries arise from the subclavian arteries. Proximal vertebral arteries not well evaluated on this exam due to motion artifact. Vertebral arteries patent to the skullbase without obvious stenosis. Skeleton: Advanced multilevel degenerative spondylolysis within the cervical spine. No definite acute osseous abnormality. No worrisome lytic or blastic osseous lesions. Other neck: Visualized lungs are clear. Mild emphysema. Visualized mediastinum grossly unremarkable. Thyroid grossly normal. No definite acute soft tissue abnormality within the  neck, although evaluation limited by motion. CTA HEAD Anterior circulation: Petrous segments patent bilaterally. Multifocal calcified plaque present within the cavernous/ supraclinoid ICAs bilaterally, left worse than right. There is associated moderate multi focal narrowing on the right (approximately up to 50% or so). Moderate to severe multi focal narrowing present on the left (50-75%). A1 segments patent. Right A1 segment slightly diminutive. Anterior communicating artery normal. Anterior cerebral arteries demonstrate multifocal atheromatous irregularity with stenoses, particularly on the right where the distal vessel is attenuated as compared to the left. M1 segments patent without stenosis or occlusion. MCA bifurcations normal. MCA branches patent proximally, and grossly symmetric. Distal small vessel atheromatous irregularity. Posterior circulation: Left vertebral artery dominant. Mild focal plaque within the left V4 segment without significant stenosis. Focal plaque within the diminutive right vertebral artery as it crosses the dural margin without significant stenosis. Additional focal plaque distally within the right V4 segment  with mild to moderate stenosis. Right posterior inferior cerebral artery patent. Left posterior inferior cerebral artery not well visualized. Basilar artery tortuous but patent to its distal aspect. Focal plaque within the proximal- mid basilar artery with moderate stenosis (series 502, image 352). Superior cerebral arteries patent. Posterior cerebral arteries arise from the basilar artery and are opacified to their distal aspects. Venous sinuses: Grossly patent, although not well evaluated on this exam due to motion artifact and timing of the contrast bolus. Anatomic variants: No anatomic variant. No aneurysm or vascular malformation. Delayed phase: No pathologic enhancement. IMPRESSION: CTA NECK IMPRESSION: 1. Mild atheromatous plaque about the carotid bifurcations bilaterally, left greater than right, without flow limiting stenosis. 2. Patent vertebral arteries within the neck. 3. Vessel tortuosity, suggestive of chronic underlying hypertension. 4. Mild emphysema. CTA HEAD IMPRESSION: 1. Negative for large vessel occlusion. 2. Multifocal atheromatous disease involving the anterior and posterior circulations as detailed above. Areas of involvement greatest within the cavernous ICAs, anterior cerebral arteries, and basilar artery. Electronically Signed   By: Rise Mu M.D.   On: 11/27/2015 06:20   Ct Head Wo Contrast  Result Date: 11/25/2015 CLINICAL DATA:  Right-sided facial droop EXAM: CT HEAD WITHOUT CONTRAST TECHNIQUE: Contiguous axial images were obtained from the base of the skull through the vertex without intravenous contrast. COMPARISON:  October 11, 2015 FINDINGS: Brain: Mild diffuse atrophy remain stable. There is no intracranial mass, hemorrhage, extra-axial fluid collection, or midline shift. There is extensive small vessel disease throughout the centra semiovale bilaterally. Small vessel disease is noted in each thalamus. There is evidence of a prior infarct in the superior posterior left  frontal lobe. There is evidence of a prior infarct on the right at the parieto-occipital junction. There is evidence of a prior small infarct in the posterior superior right cerebellum. No new gray-white compartment lesions are evident. No acute infarct is demonstrable compared to prior study. Vascular: There is no hyperdense vessel appreciable. There are focal areas of calcification in the carotid siphons bilaterally as well as in the distal vertebral artery bilaterally and basilar artery regions. Skull: The bony calvarium appears intact. Sinuses/Orbits: Orbits appear symmetric bilaterally. There is opacification in the left sphenoid sinus. Other visualized paranasal sinuses are clear. Other: Mastoid air cells are clear. IMPRESSION: Atrophy with prior infarcts and extensive small vessel disease, stable. No acute infarct evident. No intracranial mass, hemorrhage, or extra-axial fluid collection. There are multiple foci of arterial vascular calcification. There is sphenoid sinus opacification. Electronically Signed   By: Bretta Bang III M.D.   On: 11/25/2015 11:28  Ct Angio Neck W Or Wo Contrast  Result Date: 11/27/2015 CLINICAL DATA:  Initial evaluation for acute stroke. EXAM: CT ANGIOGRAPHY HEAD AND NECK TECHNIQUE: Multidetector CT imaging of the head and neck was performed using the standard protocol during bolus administration of intravenous contrast. Multiplanar CT image reconstructions and MIPs were obtained to evaluate the vascular anatomy. Carotid stenosis measurements (when applicable) are obtained utilizing NASCET criteria, using the distal internal carotid diameter as the denominator. CONTRAST:  50 cc of Isovue 370. COMPARISON:  Prior MRI from 11/26/2015. FINDINGS: CT HEAD Age-related cerebral atrophy present. Extensive chronic microvascular ischemic changes again seen. Scattered areas of encephalomalacia involving fourth cerebral hemispheres compatible with remote infarcts. Remote bilateral  cerebellar infarcts present. Prominent vascular calcifications within the carotid siphons and vertebrobasilar system. Known small punctate acute infarcts not seen. No other acute large vessel territory infarct. No acute intracranial hemorrhage. No mass lesion, midline shift, or mass effect. No hydrocephalus. No extra-axial fluid collection. Scalp soft tissues demonstrate no acute abnormality. No acute abnormality about the globes and orbits. The opacification with expansion of the left sphenoid sinus, likely related to mucocele. Paranasal sinuses are otherwise clear. No mastoid effusion. Calvarium intact. CTA NECK Aortic arch: Study limited by motion artifact. Visualized aortic arch of normal caliber with normal branch pattern. No high-grade stenosis at the origin of the great vessels. Scattered atheromatous plaque within the arch itself. Visualized subclavian arteries patent. Right carotid system: Right common carotid artery patent from its origin to the bifurcation without stenosis. Vessel tortuosity noted. Mild plaque about the right bifurcation without stenosis. Right ICA widely patent to the skullbase without stenosis, dissection, or occlusion. Left carotid system: Left common carotid artery patent from its origin to the bifurcation without high-grade stenosis. Mild plaque about the left bifurcation/ proximal left ICA without high-grade stenosis. Left ICA mildly tortuous but patent distally to the skullbase without stenosis, dissection, or occlusion. Vertebral arteries:Both vertebral arteries arise from the subclavian arteries. Proximal vertebral arteries not well evaluated on this exam due to motion artifact. Vertebral arteries patent to the skullbase without obvious stenosis. Skeleton: Advanced multilevel degenerative spondylolysis within the cervical spine. No definite acute osseous abnormality. No worrisome lytic or blastic osseous lesions. Other neck: Visualized lungs are clear. Mild emphysema. Visualized  mediastinum grossly unremarkable. Thyroid grossly normal. No definite acute soft tissue abnormality within the neck, although evaluation limited by motion. CTA HEAD Anterior circulation: Petrous segments patent bilaterally. Multifocal calcified plaque present within the cavernous/ supraclinoid ICAs bilaterally, left worse than right. There is associated moderate multi focal narrowing on the right (approximately up to 50% or so). Moderate to severe multi focal narrowing present on the left (50-75%). A1 segments patent. Right A1 segment slightly diminutive. Anterior communicating artery normal. Anterior cerebral arteries demonstrate multifocal atheromatous irregularity with stenoses, particularly on the right where the distal vessel is attenuated as compared to the left. M1 segments patent without stenosis or occlusion. MCA bifurcations normal. MCA branches patent proximally, and grossly symmetric. Distal small vessel atheromatous irregularity. Posterior circulation: Left vertebral artery dominant. Mild focal plaque within the left V4 segment without significant stenosis. Focal plaque within the diminutive right vertebral artery as it crosses the dural margin without significant stenosis. Additional focal plaque distally within the right V4 segment with mild to moderate stenosis. Right posterior inferior cerebral artery patent. Left posterior inferior cerebral artery not well visualized. Basilar artery tortuous but patent to its distal aspect. Focal plaque within the proximal- mid basilar artery with moderate stenosis (series 502, image  352). Superior cerebral arteries patent. Posterior cerebral arteries arise from the basilar artery and are opacified to their distal aspects. Venous sinuses: Grossly patent, although not well evaluated on this exam due to motion artifact and timing of the contrast bolus. Anatomic variants: No anatomic variant. No aneurysm or vascular malformation. Delayed phase: No pathologic  enhancement. IMPRESSION: CTA NECK IMPRESSION: 1. Mild atheromatous plaque about the carotid bifurcations bilaterally, left greater than right, without flow limiting stenosis. 2. Patent vertebral arteries within the neck. 3. Vessel tortuosity, suggestive of chronic underlying hypertension. 4. Mild emphysema. CTA HEAD IMPRESSION: 1. Negative for large vessel occlusion. 2. Multifocal atheromatous disease involving the anterior and posterior circulations as detailed above. Areas of involvement greatest within the cavernous ICAs, anterior cerebral arteries, and basilar artery. Electronically Signed   By: Rise Mu M.D.   On: 11/27/2015 06:20   Mr Brain Wo Contrast  Result Date: 11/26/2015 CLINICAL DATA:  Previous history of stroke. Right facial droop and mental status changes beginning yesterday. EXAM: MRI HEAD WITHOUT CONTRAST TECHNIQUE: Multiplanar, multiecho pulse sequences of the brain and surrounding structures were obtained without intravenous contrast. COMPARISON:  Head CT 11/25/2015.  MRI 04/02/2014. FINDINGS: Brain: The study suffers from motion degradation. There are 3 or 4 scattered punctate foci of restricted diffusion in the left hemisphere in the middle cerebral artery territory consistent with micro embolic infarctions. No large vessel territory acute infarction. The brainstem is normal. There are old small vessel cerebellar infarctions. There is extensive chronic small-vessel ischemic change affecting the thalamotomy, basal ganglia and throughout the cerebral hemispheric white matter. There is an old cortical infarction at the left insula and posterior frontal region. There is old cortical infarction in both occipital regions right more than left. There is old cortical infarction in the right frontal region. No evidence of mass lesion. No sign of acute hemorrhage. There are scattered foci of hemosiderin deposition related to the old infarctions. No hydrocephalus. No extra-axial collection.  No pituitary mass. Vascular: Major vessels at the base of the brain show flow. Skull and upper cervical spine: Negative Sinuses/Orbits: No significant sinus disease. No visible orbital pathology. Other: None IMPRESSION: Three or 4 scattered punctate acute infarctions in the left MCA territory consistent with micro embolic infarctions. No large infarction. No swelling or acute hemorrhage. 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 Extensive chronic small vessel ischemic changes elsewhere throughout the brain. Electronically Signed   By: Paulina Fusi M.D.   On: 11/26/2015 12:57   Dg Chest Port 1 View  Result Date: 11/25/2015 CLINICAL DATA:  Weakness, altered mental status, history of diabetes, atrial fibrillation, previous CVA, current smoker. EXAM: PORTABLE CHEST 1 VIEW COMPARISON:  Chest x-ray of April 02, 2014 FINDINGS: The lungs are adequately inflated. There is increased density at the right lung base. There is no pleural effusion. The cardiac silhouette is enlarged but stable. The pulmonary vascularity is normal. There is calcification in the wall of the thoracic aorta. The bony thorax exhibits no acute abnormality. IMPRESSION: Right lower lobe atelectasis or pneumonia. Followup PA and lateral chest X-ray is recommended in 3-4 weeks following trial of antibiotic therapy to ensure resolution and exclude underlying malignancy. Stable cardiomegaly without pulmonary vascular congestion. Aortic atherosclerosis. Electronically Signed   By: David  [place] M.D.   On: 11/25/2015 11:04        Scheduled Meds: .  stroke: mapping our early stages of recovery book   Does not apply Once  . apixaban  5 mg Oral  BID  . aspirin EC  81 mg Oral Daily  . atorvastatin  40 mg Oral q1800  . ceFEPime (MAXIPIME) IV  2 g Intravenous Q12H  . insulin aspart  0-9 Units Subcutaneous TID WC  . iopamidol      . LORazepam  1 mg Intravenous Once  . sertraline  75 mg Oral Daily  . sodium  chloride flush  3 mL Intravenous Q12H  . vancomycin  500 mg Intravenous Q12H   Continuous Infusions:    LOS: 2 days    Time spent: 40 minutes    Alayia Meggison, MD [facility] Pager [phone]  If 7PM-7AM, please contact night-coverage [email or web address] Password [email or web address] 11/27/2015, 9:53 AM

## 2015-11-27 NOTE — Progress Notes (Signed)
SLP Cancellation Note  Patient Details Name: Rebecca [name] MRN: [id number] DOB: 03-31-1945   Cancelled treatment:       Reason Eval/Treat Not Completed: Other (comment) (Patient refused PO's.  ST will continue efforts to follow up for therapeutic diet tolerance and possible advancement.  )  Dimas Aguas, MA, CCC-SLP Acute Rehab SLP [phone] [place] 11/27/2015, 9:58 AM

## 2015-11-27 NOTE — Progress Notes (Signed)
  Echocardiogram 2D Echocardiogram has been performed.  Rebecca [name] 11/27/2015, 9:53 AM

## 2015-11-27 NOTE — Progress Notes (Addendum)
STROKE TEAM PROGRESS NOTE   HISTORY OF PRESENT ILLNESS (per record) Rebecca [name] is an 70 y.o. female with a history of atrial fibrillation on Eliquis, previous stroke with residual aphasia and hemiparesis, hypertension, diabetes mellitus and hyperlipidemia who was admitted on 11/25/2015 for new onset mental status changes, leaning towards the right side and right facial droop. She was admitted for acute encephalopathy, likely related to acute pneumonia, as well as possible recurrent stroke. MRI of the brain showed multiple punctate acute infarctions involving the left MCA territory, likely embolic. 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  LSN: Unclear tPA Given: No: On Eliquis; unclear when last known well mRankin:   SUBJECTIVE (INTERVAL HISTORY) No family is at the bedside.  Overall she feels her condition is stable. She still has severe aphasia and mild right hemiparesis, but seems as her baseline. CTA head and neck today he not show any significant large vessel occlusion. EEG negative for seizure. She is on suddenly and medical for presumed pneumonia treatment.   OBJECTIVE Temp:  [97.9 F (36.6 C)-98.8 F (37.1 C)] 98.8 F (37.1 C) (09/09 0800) Pulse Rate:  [65-76] 65 (09/09 0800) Cardiac Rhythm: Heart block;Normal sinus rhythm (09/09 0830) Resp:  [15-29] 15 (09/09 0800) BP: (136-151)/(94-105) 136/104 (09/09 0800) SpO2:  [94 %-100 %] 100 % (09/09 0800)  CBC:  Recent Labs Lab 11/25/15 1050  11/26/15 0256 11/27/15 0534  WBC 3.5*  --  3.5* 3.2*  NEUTROABS 2.0  --   --   --   HGB 10.8*  < > 9.2* 9.0*  HCT 36.2  < > 29.7* 29.1*  MCV 83.2  --  80.7 80.4  PLT 281  --  168 213  < > = values in this interval not displayed.  Basic Metabolic Panel:   Recent Labs Lab 11/26/15 0256 11/27/15 0534  NA 143 144  K 5.1 3.9  CL 112* 113*  CO2 22 23  GLUCOSE 89 106*  BUN 14 15  CREATININE 0.88 0.91  CALCIUM 8.4* 8.8*    Lipid Panel:      Component Value Date/Time   CHOL 133 11/27/2015 0534   TRIG 54 11/27/2015 0534   HDL 52 11/27/2015 0534   CHOLHDL 2.6 11/27/2015 0534   VLDL 11 11/27/2015 0534   LDLCALC 70 11/27/2015 0534   HgbA1c:  Lab Results  Component Value Date   HGBA1C 5.8 05/05/2015   Urine Drug Screen:     Component Value Date/Time   LABOPIA NONE DETECTED 11/25/2015 1243   COCAINSCRNUR NONE DETECTED 11/25/2015 1243   LABBENZ NONE DETECTED 11/25/2015 1243   AMPHETMU NONE DETECTED 11/25/2015 1243   THCU NONE DETECTED 11/25/2015 1243   LABBARB NONE DETECTED 11/25/2015 1243      IMAGING I have personally reviewed the radiological images below and agree with the radiology interpretations.  Ct Angio Head and Neck W Or Wo Contrast 11/27/2015  CTA NECK  1. Mild atheromatous plaque about the carotid bifurcations bilaterally, left greater than right, without flow limiting stenosis.  2. Patent vertebral arteries within the neck.  3. Vessel tortuosity, suggestive of chronic underlying hypertension.  4. Mild emphysema.   CTA HEAD  1. Negative for large vessel occlusion.  2. Multifocal atheromatous disease involving the anterior and posterior circulations as detailed above. Areas of involvement greatest within the cavernous ICAs, anterior cerebral arteries, and basilar artery.   Ct Head Wo Contrast 11/25/2015 Atrophy with prior infarcts and extensive small vessel disease, stable. No acute infarct  evident. No intracranial mass, hemorrhage, or extra-axial fluid collection. There are multiple foci of arterial vascular calcification. There is sphenoid sinus opacification.   Mr Brain Wo Contrast 11/26/2015 3 or 4 scattered punctate acute infarctions in the left MCA territory consistent with micro embolic infarctions. No large infarction. No swelling or acute hemorrhage. 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 Extensive chronic small vessel ischemic changes elsewhere throughout  the brain.   MRA brain 1. Moderate medium and distal small vessel disease as previously seen. 2. Findings are significantly exaggerated by severe patient motion. 3. Accounting for artifact, no significant proximal stenosis, aneurysm, or branch vessel occlusion is evident.  Dg Chest Port 1 View 11/25/2015 Right lower lobe atelectasis or pneumonia.  Followup PA and lateral chest X-ray is recommended in 3-4 weeks following trial of antibiotic therapy to ensure resolution and exclude underlying malignancy.  Stable cardiomegaly without pulmonary vascular congestion. Aortic atherosclerosis.   EEG 11/26/2015 This is an abnormal EEG demonstrating a mild diffuse slowing of electrocerebral activity.  This can be seen in a wide variety of encephalopathic state including those of a toxic, metabolic, or degenerative nature.  There were no focal, hemispheric, or lateralizing features.  No epileptiform activity was recorded.  2-D echo - Procedure narrative: Transthoracic echocardiography. Image   quality was poor. The study was technically difficult, as a   result of poor patient compliance. - Left ventricle: The cavity size was normal. Wall thickness was   increased in a pattern of severe LVH. Systolic function was   moderately reduced. The estimated ejection fraction was in the   range of 35% to 40%. Diffuse hypokinesis. Findings consistent   with left ventricular diastolic dysfunction. Doppler parameters   are consistent with high ventricular filling pressure. - Ventricular septum: The contour showed systolic flattening. These   changes are consistent with RV pressure overload. - Aortic valve: Trileaflet; mildly calcified leaflets.   Morphologically, there appeared to be mild aortic stenosis. There   was mild regurgitation. - Aorta: Mild aortic root dilatation. Aortic root dimension: 43 mm   (ED). - Mitral valve: There was moderate regurgitation. - Left atrium: The atrium was severely  dilated. - Right ventricle: Systolic function was moderately reduced. - Right atrium: The atrium was mildly dilated. - Atrial septum: No defect or patent foramen ovale was identified. - Tricuspid valve: There was mild-moderate regurgitation. - Pulmonary arteries: PA peak pressure: 44 mm Hg (S).   PHYSICAL EXAM  Temp:  [97.9 F (36.6 C)-98.8 F (37.1 C)] 98.4 F (36.9 C) (09/09 1633) Pulse Rate:  [65-87] 77 (09/09 1633) Resp:  [15-29] 22 (09/09 1633) BP: (132-156)/(94-105) 147/104 (09/09 1633) SpO2:  [94 %-100 %] 100 % (09/09 1633)  General - thin built, well developed, in mild respiratory distress.  Ophthalmologic - Fundi not visualized due to noncooperation.  Cardiovascular - Regular rate and rhythm.  Neuro - awake, alert, in mild respiratory distress. Largely globally aphasic, not following commands except repeated "I am here" on command also severe dysarthria. Paucity of speech. Not able to name or repeat longer sentences. Blinking to visual threat on the left briskly, inconsistently blinking to visual threat on the right. PERRL, eyes moving bilaterally. No significant facial asymmetry. Tongue n the middle inside mouth. LUE 4/5 but RUE 3/5, BLEs 0/5 hip flexion, 3/5 knee extension, 0/5 DF and PF. RLE increased muscle tone, no Babinski bilaterally.   ASSESSMENT/PLAN Ms. Shellia CarwinConstance E Gimbel is a 70 y.o. female with history of atrial fibrillation on  Eliquis, previous stroke with residual aphasia and hemiparesis, hypertension, diabetes mellitus, and hyperlipidemia, presenting with altered mental status and right sided weakness. She did not receive IV t-PA due to Eliquis.   Recrudescence of previous left MCA stroke in the setting of pneumonia   AMS and right-sided weakness improved, currently at baseline  On Cefepime and vancomycin  CXR concerning for RLL pneumonia  Continue treatment as per primary team   Strokes: left MCA punctate infarcts, embolic probably from atrial  fibrillation vs. infection.  Resultant improved AMS and right-sided weakness, currently at baseline  MRI - 3 or 4 scattered punctate acute infarctions in the left MCA territory.  CTA Head & Neck - no large vessel occlusion, atherosclerosis bilateral ICA cavernous segment  2D Echo - EF 35-40%  LDL - 70  HgbA1c pending  Ammonia 37   VTE prophylaxis - Eliquis DIET - DYS 1 Room service appropriate? Yes; Fluid consistency: Nectar Thick  Eliquis (apixaban) daily prior to admission, now on aspirin 81 mg daily and Eliquis (apixaban) daily. Continue aspirin 81 and eliquis 5mg  twice a day on discharge.   Patient counseled to be compliant with her antithrombotic medications  Ongoing aggressive stroke risk factor management  Therapy recommendations: Pending PT OT  Disposition: Pending  History of stroke  03/2013 expressive aphasia and right-sided weakness, MRI showed left frontal embolic infarct, Loop recorder later showed A. fib, patient will put on Xarelto  03/2014 episode of slumping over, global aphasia and right hemianopia. EEG negative. MRI showed left MCA infarcts. MRA showed left MCA decreased blood flow. A1c 6.1 and negative carotid Doppler with TTE EF 45-50%. Xarelto changed to eliquis  A. fib on eilquis  Rate controlled  Continue eliquis 5mg  bid  Add baby aspirin for further protection  Hypertension  Stable  Permissive hypertension (OK if < 220/120) but gradually normalize in 5-7 days  Long-term BP goal normotensive  Hyperlipidemia  Home meds:  Lipitor 40 mg daily resumed in hospital  LDL 70, goal < 70  Continue statin at discharge  Diabetes  HgbA1c pending, goal < 7.0  Controlled  CBG monitoring  Other Stroke Risk Factors  Advanced age  ?? Fabry's disease diagnosed in 2015 - however, alpha galactosidase was WNL.   Former smoker  Other Active Problems  Anemia / leukopenia  depression on [facility] day # 2  Neurology will sign off.  Please call with questions. Pt will follow up with Dr. Roda Shutters at [facility] in about 2 months. Thanks for the consult.  Marvel Plan, MD PhD Stroke Neurology 11/27/2015 5:37 PM      To contact Stroke Continuity provider, please refer to [email or web address]. After hours, contact General Neurology

## 2015-11-28 DIAGNOSIS — E785 Hyperlipidemia, unspecified: Secondary | ICD-10-CM

## 2015-11-28 DIAGNOSIS — I214 Non-ST elevation (NSTEMI) myocardial infarction: Secondary | ICD-10-CM

## 2015-11-28 DIAGNOSIS — E875 Hyperkalemia: Secondary | ICD-10-CM

## 2015-11-28 LAB — CULTURE, BLOOD (ROUTINE X 2)

## 2015-11-28 LAB — CBC
HEMATOCRIT: 28.5 % — AB (ref 36.0–46.0)
HEMOGLOBIN: 8.6 g/dL — AB (ref 12.0–15.0)
MCH: 24.2 pg — AB (ref 26.0–34.0)
MCHC: 30.2 g/dL (ref 30.0–36.0)
MCV: 80.3 fL (ref 78.0–100.0)
Platelets: 212 10*3/uL (ref 150–400)
RBC: 3.55 MIL/uL — AB (ref 3.87–5.11)
RDW: 19.1 % — ABNORMAL HIGH (ref 11.5–15.5)
WBC: 3.8 10*3/uL — ABNORMAL LOW (ref 4.0–10.5)

## 2015-11-28 LAB — BASIC METABOLIC PANEL
ANION GAP: 6 (ref 5–15)
BUN: 13 mg/dL (ref 6–20)
CALCIUM: 8.7 mg/dL — AB (ref 8.9–10.3)
CO2: 22 mmol/L (ref 22–32)
Chloride: 115 mmol/L — ABNORMAL HIGH (ref 101–111)
Creatinine, Ser: 0.84 mg/dL (ref 0.44–1.00)
Glucose, Bld: 109 mg/dL — ABNORMAL HIGH (ref 65–99)
POTASSIUM: 3.8 mmol/L (ref 3.5–5.1)
Sodium: 143 mmol/L (ref 135–145)

## 2015-11-28 LAB — GLUCOSE, CAPILLARY
GLUCOSE-CAPILLARY: 107 mg/dL — AB (ref 65–99)
GLUCOSE-CAPILLARY: 107 mg/dL — AB (ref 65–99)
GLUCOSE-CAPILLARY: 117 mg/dL — AB (ref 65–99)
Glucose-Capillary: 112 mg/dL — ABNORMAL HIGH (ref 65–99)

## 2015-11-28 LAB — HEMOGLOBIN A1C
Hgb A1c MFr Bld: 5.8 % — ABNORMAL HIGH (ref 4.8–5.6)
Mean Plasma Glucose: 120 mg/dL

## 2015-11-28 MED ORDER — SODIUM CHLORIDE 0.9 % IV SOLN: INTRAVENOUS | Status: DC

## 2015-11-28 MED ORDER — ORAL CARE MOUTH RINSE
15.0000 mL | Freq: Two times a day (BID) | OROMUCOSAL | Status: DC
  Administered 2015-11-28 – 2015-11-30 (×6): 15 mL via OROMUCOSAL

## 2015-11-28 MED ORDER — METOPROLOL TARTRATE 12.5 MG HALF TABLET
12.5000 mg | ORAL_TABLET | Freq: Two times a day (BID) | ORAL | Status: DC
  Administered 2015-11-28: 12.5 mg via ORAL
  Filled 2015-11-28 (×4): qty 1

## 2015-11-28 MED ORDER — ACETAMINOPHEN 160 MG/5ML PO SOLN: 500.0000 mg | ORAL | Status: DC

## 2015-11-28 MED ORDER — AGALSIDASE BETA 5 MG IV SOLR
1.0000 mg/kg | INTRAVENOUS | Status: DC
Start: 2015-12-01 — End: 2015-11-28

## 2015-11-28 NOTE — Progress Notes (Addendum)
PROGRESS NOTE    Rebecca [name]  ZOX:[id number] DOB: [birth year] DOA: 11/25/2015 PCP: Merrilee Seashore, MD   Brief Narrative:  Rebecca [name] is a 70 y.o. 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 fib on our requests, hypertension, diabetes type 2, hyperlipidemia, systolic and diastolic heart failure, since emergency department from a nursing facility with the chief complaint of altered mental status. Initial evaluation concerning for sepsis related to healthcare associated pneumonia and elevated troponin concerning for ACS versus demand ischemia  Information is obtained from the staff and the chart as the patient remains somewhat encephalopathic. Ports indicate patient found unresponsive this morning. Poorly patient was at her baseline mental state at bedtime last night which is follow commands but mostly nonverbal. She was awakened this morning staff noted her leaning to the right not following commands. She was sent to the emergency department for acute encephalopathy. No report of any recent fever chills diarrhea vomiting. She is mostly bedbound in the facility and nonverbal. Patient placed empirically on IV antibiotics of vancomycin and cefepime for healthcare associated pneumonia versus aspiration pneumonia. Patient with clinical improvement. MRI of the head which was done was consistent with acute infarction. Stroke workup underway. Neurology consulted.     Assessment & Plan:   Principal Problem:   Acute encephalopathy Active Problems:   Cerebral embolism with cerebral infarction   HCAP (healthcare-associated pneumonia)   Elevated troponin   Global aphasia   Paroxysmal atrial fibrillation (HCC)   Hyperlipidemia   Apraxia following CVA (cerebrovascular accident)   Dysphagia S/P CVA (cerebrovascular accident)   Type II diabetes mellitus with neurological manifestations (HCC)   Benign hypertensive heart disease without heart failure   Anemia, chronic disease   Systolic and diastolic CHF, chronic (HCC)   Fabry disease (HCC)   Hyperkalemia  #1 acute encephalopathy Likely multifactorial secondary to acute infarcts noted on MRI head and also metabolic in nature secondary to healthcare associated pneumonia versus aspiration pneumonia. Patient noted on admission to have a right facial droop from the nursing facility. Concern for also possible seizures as patient was noted to be apneic and bradycardic overnight without hypotension. EEG which was done demonstrated mild diffuse slowing of electrocerebral activity. No epileptiform activity was recorded. Patient with clinical improvement and likely close to baseline per son yesterday. Patient has been pancultured results pending. CT head with no acute abnormalities. MRI head with 3- 4 scattered punctuate acute infarctions in the left MCA territory consistent with microembolic infarctions. No large infarction. No swelling or acute hemorrhage. Old cortical infarct affecting both cerebral hemispheres noted. Extensive chronic small vessel ischemic changes elsewhere throughout the brain. MRA of the head with moderate medium in distal small vessel disease as previously seen. No significant proximal stenosis, aneurysm or branch vessel occlusion evident.  2-D echo with EF of 35-40%. Diffuse hypokinesis. Left atrium severely dilated. Right ventricular systolic function moderately reduced.  CT angiogram head and neck with no large vessel occlusion, atherosclerosis bilaterally ICA cavernous segment.  Continue eliquis and baby aspirin 81 mg, for secondary stroke prevention. PT/OT /ST. Neurology has been consulted and following. Continue empiric IV cefepime. Follow.  #2 acute CVA  MRI head with 3- 4 scattered punctuate acute infarctions in the left MCA territory consistent with microembolic infarctions. No large infarction. No swelling or acute  hemorrhage. Old cortical infarct affecting both cerebral hemispheres noted. Extensive chronic small vessel ischemic changes elsewhere throughout the brain.  MRA head with  moderate medium in distal small vessel disease. No significant proximal stenosis, aneurysm or branch vessel occlusion is evident.  2-D echo with EF of 35-40% with diffuse hypokinesis. Left ventricular diastolic dysfunction. Right ventricle systolic function moderately reduced. Severely dilated left atrium. Mildly dilated right atrium. No source of emboli noted. CT angiogram head and neck with no large vessel occlusion, atherosclerosis bilaterally ICA cavernous segment. LDL of 70. Patient has been seen by neurology and aspirin 81 mg daily has been added to eliquis for further protection and secondary stroke prophylaxis. PT/OT/ST. neurology following.   #3 healthcare associated pneumonia versus aspiration pneumonia Per chest x-ray. Patient was on regular diet with thin liquids prior to admission. Patient has been assessed by speech therapy and recommended a dysphagia 1 diet with nectar thick liquids. Lactic acid levels trending down. Sputum Gram stain and cultures pending. Urine strep pneumococcus antigen is negative. Urine Legionella antigen pending. Blood cultures preliminary with 1 out of 2 with coagulase negative staph. Continue empiric IV cefepime. DC IV vancomycin. Follow.  #4 chronic systolic and diastolic CHF Stable. 2-D echo pending. Beta blocker on hold secondary to bradycardia. Patient with allergy to ace inhibitors. Continue Lipitor, aspirin,eliquis.  #5 elevated troponin/?NSTEMI Felt to likely be a demand ischemia in the setting of pneumonia. Concern for probable non-STEMI as 2-D echo with a EF of 35-40%. Diffuse hypokinesis. Findings consistent with left ventricular diastolic dysfunction, right ventricular systolic function moderately reduced, left atrium severely dilated, right atrium moderately dilated. Patient's beta  blocker was held secondary to episodes of apnea and bradycardia. Unable to place on ACE inhibitor due to allergy. Continue baby aspirin 81 mg daily, eliquis, Lipitor. Patient has been assessed per cardiology and it was felt no ischemic evaluation needed at this time. Cards following.  #6 paroxysmal atrial fibrillation CHA2DS2vASC = 6 Patient in normal sinus rhythm. Patient noted to have periods of apnea with bradycardia 2 nights ago, and as such beta blocker on hold. 2-D echo pending. Continue ELIQUIS for anticoagulation.  #7 history of CVA Continue eliquis and baby aspirin for secondary stroke prevention. Continue Lipitor.  #8 Fabry's disease  Outpatient follow-up.  #9 chronic systolic and diastolic heart failure Stable. 2-D echo with EF of 35-40%. Diffuse hypokinesis. Findings consistent with left ventricular diastolic dysfunction. Right ventricular systolic function moderately reduced. Left atrium severely dilated. Follow.  #10.  type 2 diabetes mellitus Hemoglobin A1c was 5.28 April 2015. CBGs 107-124. Continue sliding scale insulin.     DVT prophylaxis: eliquis Code Status: Full code for now. Son to discuss with his sister in terms of CODE STATUS. Family Communication: Updated son Charlean Sanfilippo via telephone [phone] ).No family at bedside.  Disposition Plan: Transfer to telemetry. Back to skilled nursing facility when medically stable and back to baseline pending PT evaluation.   Consultants:   PCCM: Dr Isaiah Serge 11/25/2015  Cardiology: Dr. Clifton James 11/25/2015  Neurology: Dr. Roseanne Reno 11/26/2015  Procedures:   EEG 11/26/2015  MRI 11/26/2015  Chest x-ray 11/25/2015  CT head 11/25/2015  2-D echo 11/27/2015  MRA 11/27/2015  CT angiogram head and neck 11/27/2015  Antimicrobials:   IV vancomycin 11/25/2015>>>>> 11/28/2015  IV cefepime 11/25/2015   Subjective: Patient alert although aphasic. Patient tracking.   Objective: Vitals:   11/27/15 2015 11/27/15 2318  11/28/15 0352 11/28/15 0848  BP: (!) 141/102 (!) 150/110 (!) 148/97   Pulse: 73 83 91   Resp: (!) 26 (!) 28 16   Temp:  98.8 F (37.1 C) 99.5 F (37.5 C) 99 F (37.2 C)  TempSrc:  Oral Oral Oral  SpO2: 99% 99% 99%   Weight:        Intake/Output Summary (Last 24 hours) at 11/28/15 0854 Last data filed at 11/28/15 0540  Gross per 24 hour  Intake              340 ml  Output               50 ml  Net              290 ml   Filed Weights   11/25/15 1311  Weight: 62.6 kg (138 lb)    Examination:  General exam: Appears calm and comfortable. Eyes open. Respiratory system: Clear to auscultation anterior lung fields. Respiratory effort normal. Cardiovascular system: Irregularly irregular. No JVD, murmurs, rubs, gallops or clicks. No pedal edema. Gastrointestinal system: Abdomen is nondistended, soft and nontender. No organomegaly or masses felt. Normal bowel sounds heard. Central nervous system: Alert and aphasic however following some commands and tracking appropriately.  Extremities: Symmetric 5 x 5 power. Skin: No rashes, lesions or ulcers Psychiatry: Judgement and insight appear normal. Mood & affect appropriate.     Data Reviewed: I have personally reviewed following labs and imaging studies  CBC:  Recent Labs Lab 11/25/15 1050 11/25/15 1100 11/25/15 1516 11/26/15 0256 11/27/15 0534 11/28/15 0230  WBC 3.5*  --   --  3.5* 3.2* 3.8*  NEUTROABS 2.0  --   --   --   --   --   HGB 10.8* 12.6  --  9.2* 9.0* 8.6*  HCT 36.2 37.0 31.8* 29.7* 29.1* 28.5*  MCV 83.2  --   --  80.7 80.4 80.3  PLT 281  --   --  168 213 212   Basic Metabolic Panel:  Recent Labs Lab 11/25/15 1100 11/25/15 1110 11/26/15 0256 11/27/15 0534 11/28/15 0230  NA 141 143 143 144 143  K 7.4* 4.2 5.1 3.9 3.8  CL 109 110 112* 113* 115*  CO2  --  23 22 23 22   GLUCOSE 94 99 89 106* 109*  BUN 26* 17 14 15 13   CREATININE 1.00 0.97 0.88 0.91 0.84  CALCIUM  --  9.2 8.4* 8.8* 8.7*   GFR: Estimated  Creatinine Clearance: 53 mL/min (by C-G formula based on SCr of 0.84 mg/dL). Liver Function Tests:  Recent Labs Lab 11/25/15 1110  AST 31  ALT 13*  ALKPHOS 37*  BILITOT 0.9  PROT 7.7  ALBUMIN 3.2*   No results for input(s): LIPASE, AMYLASE in the last 168 hours.  Recent Labs Lab 11/27/15 0534  AMMONIA 37*   Coagulation Profile:  Recent Labs Lab 11/25/15 1110  INR 1.64   Cardiac Enzymes:  Recent Labs Lab 11/25/15 1517 11/25/15 2005  TROPONINI 0.19* 0.19*   BNP (last 3 results) No results for input(s): PROBNP in the last 8760 hours. HbA1C: No results for input(s): HGBA1C in the last 72 hours. CBG:  Recent Labs Lab 11/27/15 0827 11/27/15 1228 11/27/15 1631 11/27/15 2124 11/28/15 0837  GLUCAP 97 96 142* 124* 107*   Lipid Profile:  Recent Labs  11/27/15 0534  CHOL 133  HDL 52  LDLCALC 70  TRIG 54  CHOLHDL 2.6   Thyroid Function Tests: No results for input(s): TSH, T4TOTAL, FREET4, T3FREE, THYROIDAB in the last 72 hours. Anemia Panel:  Recent Labs  11/25/15 1517 11/26/15 0816  VITAMINB12 1,456* 1,214*  FOLATE  --  14.2  FERRITIN  --  117  TIBC  --  333  IRON  --  39  RETICCTPCT  --  1.5   Sepsis Labs:  Recent Labs Lab 11/25/15 1134 11/25/15 1517 11/25/15 1630 11/26/15 0816  LATICACIDVEN 3.26* 2.8* 2.7* 1.5    Recent Results (from the past 240 hour(s))  Culture, blood (Routine X 2) w Reflex to ID Panel     Status: Abnormal   Collection Time: 11/25/15 12:05 PM  Result Value Ref Range Status   Specimen Description BLOOD RIGHT ANTECUBITAL  Final   Special Requests IN PEDIATRIC BOTTLE  1CC  Final   Culture  Setup Time   Final    GRAM POSITIVE COCCI IN CLUSTERS IN PEDIATRIC BOTTLE CRITICAL RESULT CALLED TO, READ BACK BY AND VERIFIED WITH: N. Batchelder Pharm.D. 14:55 11/26/15 ([email or web address])    Culture (A)  Final    STAPHYLOCOCCUS SPECIES (COAGULASE NEGATIVE) THE SIGNIFICANCE OF ISOLATING THIS ORGANISM FROM A SINGLE SET OF BLOOD CULTURES  WHEN MULTIPLE SETS ARE DRAWN IS UNCERTAIN. PLEASE NOTIFY THE MICROBIOLOGY DEPARTMENT WITHIN ONE WEEK IF SPECIATION AND SENSITIVITIES ARE REQUIRED.    Report Status 11/28/2015 FINAL  Final  Culture, blood (Routine X 2) w Reflex to ID Panel     Status: None (Preliminary result)   Collection Time: 11/25/15 12:28 PM  Result Value Ref Range Status   Specimen Description BLOOD RIGHT HAND  Final   Special Requests IN PEDIATRIC BOTTLE  1CC  Final   Culture NO GROWTH 2 DAYS  Final   Report Status PENDING  Incomplete  Urine culture     Status: None   Collection Time: 11/25/15 12:43 PM  Result Value Ref Range Status   Specimen Description URINE, CATHETERIZED  Final   Special Requests NONE  Final   Culture NO GROWTH  Final   Report Status 11/26/2015 FINAL  Final  Gram stain     Status: None   Collection Time: 11/25/15 12:43 PM  Result Value Ref Range Status   Specimen Description URINE, CATHETERIZED  Final   Special Requests NONE  Final   Gram Stain   Final    WBC PRESENT,BOTH PMN AND MONONUCLEAR GRAM POSITIVE RODS GRAM POSITIVE COCCI IN CLUSTERS GRAM NEGATIVE RODS CYTOSPIN SMEAR    Report Status 11/25/2015 FINAL  Final  MRSA PCR Screening     Status: None   Collection Time: 11/25/15  2:52 PM  Result Value Ref Range Status   MRSA by PCR NEGATIVE NEGATIVE Final    Comment:        The GeneXpert MRSA Assay (FDA approved for NASAL specimens only), is one component of a comprehensive MRSA colonization surveillance program. It is not intended to diagnose MRSA infection nor to guide or monitor treatment for MRSA infections.          Radiology Studies: Ct Angio Head W Or Wo Contrast  Result Date: 11/27/2015 CLINICAL DATA:  Initial evaluation for acute stroke. EXAM: CT ANGIOGRAPHY HEAD AND NECK TECHNIQUE: Multidetector CT imaging of the head and neck was performed using the standard protocol during bolus administration of intravenous contrast. Multiplanar CT image reconstructions and  MIPs were obtained to evaluate the vascular anatomy. Carotid stenosis measurements (when applicable) are obtained utilizing NASCET criteria, using the distal internal carotid diameter as the denominator. CONTRAST:  50 cc of Isovue 370. COMPARISON:  Prior MRI from 11/26/2015. FINDINGS: CT HEAD Age-related cerebral atrophy present. Extensive chronic microvascular ischemic changes again seen. Scattered areas of encephalomalacia involving fourth cerebral hemispheres compatible with remote infarcts. Remote bilateral cerebellar infarcts present. Prominent vascular calcifications  within the carotid siphons and vertebrobasilar system. Known small punctate acute infarcts not seen. No other acute large vessel territory infarct. No acute intracranial hemorrhage. No mass lesion, midline shift, or mass effect. No hydrocephalus. No extra-axial fluid collection. Scalp soft tissues demonstrate no acute abnormality. No acute abnormality about the globes and orbits. The opacification with expansion of the left sphenoid sinus, likely related to mucocele. Paranasal sinuses are otherwise clear. No mastoid effusion. Calvarium intact. CTA NECK Aortic arch: Study limited by motion artifact. Visualized aortic arch of normal caliber with normal branch pattern. No high-grade stenosis at the origin of the great vessels. Scattered atheromatous plaque within the arch itself. Visualized subclavian arteries patent. Right carotid system: Right common carotid artery patent from its origin to the bifurcation without stenosis. Vessel tortuosity noted. Mild plaque about the right bifurcation without stenosis. Right ICA widely patent to the skullbase without stenosis, dissection, or occlusion. Left carotid system: Left common carotid artery patent from its origin to the bifurcation without high-grade stenosis. Mild plaque about the left bifurcation/ proximal left ICA without high-grade stenosis. Left ICA mildly tortuous but patent distally to the  skullbase without stenosis, dissection, or occlusion. Vertebral arteries:Both vertebral arteries arise from the subclavian arteries. Proximal vertebral arteries not well evaluated on this exam due to motion artifact. Vertebral arteries patent to the skullbase without obvious stenosis. Skeleton: Advanced multilevel degenerative spondylolysis within the cervical spine. No definite acute osseous abnormality. No worrisome lytic or blastic osseous lesions. Other neck: Visualized lungs are clear. Mild emphysema. Visualized mediastinum grossly unremarkable. Thyroid grossly normal. No definite acute soft tissue abnormality within the neck, although evaluation limited by motion. CTA HEAD Anterior circulation: Petrous segments patent bilaterally. Multifocal calcified plaque present within the cavernous/ supraclinoid ICAs bilaterally, left worse than right. There is associated moderate multi focal narrowing on the right (approximately up to 50% or so). Moderate to severe multi focal narrowing present on the left (50-75%). A1 segments patent. Right A1 segment slightly diminutive. Anterior communicating artery normal. Anterior cerebral arteries demonstrate multifocal atheromatous irregularity with stenoses, particularly on the right where the distal vessel is attenuated as compared to the left. M1 segments patent without stenosis or occlusion. MCA bifurcations normal. MCA branches patent proximally, and grossly symmetric. Distal small vessel atheromatous irregularity. Posterior circulation: Left vertebral artery dominant. Mild focal plaque within the left V4 segment without significant stenosis. Focal plaque within the diminutive right vertebral artery as it crosses the dural margin without significant stenosis. Additional focal plaque distally within the right V4 segment with mild to moderate stenosis. Right posterior inferior cerebral artery patent. Left posterior inferior cerebral artery not well visualized. Basilar artery  tortuous but patent to its distal aspect. Focal plaque within the proximal- mid basilar artery with moderate stenosis (series 502, image 352). Superior cerebral arteries patent. Posterior cerebral arteries arise from the basilar artery and are opacified to their distal aspects. Venous sinuses: Grossly patent, although not well evaluated on this exam due to motion artifact and timing of the contrast bolus. Anatomic variants: No anatomic variant. No aneurysm or vascular malformation. Delayed phase: No pathologic enhancement. IMPRESSION: CTA NECK IMPRESSION: 1. Mild atheromatous plaque about the carotid bifurcations bilaterally, left greater than right, without flow limiting stenosis. 2. Patent vertebral arteries within the neck. 3. Vessel tortuosity, suggestive of chronic underlying hypertension. 4. Mild emphysema. CTA HEAD IMPRESSION: 1. Negative for large vessel occlusion. 2. Multifocal atheromatous disease involving the anterior and posterior circulations as detailed above. Areas of involvement greatest within the cavernous ICAs,  anterior cerebral arteries, and basilar artery. Electronically Signed   By: Rise Mu M.D.   On: 11/27/2015 06:20   Ct Angio Neck W Or Wo Contrast  Result Date: 11/27/2015 CLINICAL DATA:  Initial evaluation for acute stroke. EXAM: CT ANGIOGRAPHY HEAD AND NECK TECHNIQUE: Multidetector CT imaging of the head and neck was performed using the standard protocol during bolus administration of intravenous contrast. Multiplanar CT image reconstructions and MIPs were obtained to evaluate the vascular anatomy. Carotid stenosis measurements (when applicable) are obtained utilizing NASCET criteria, using the distal internal carotid diameter as the denominator. CONTRAST:  50 cc of Isovue 370. COMPARISON:  Prior MRI from 11/26/2015. FINDINGS: CT HEAD Age-related cerebral atrophy present. Extensive chronic microvascular ischemic changes again seen. Scattered areas of encephalomalacia  involving fourth cerebral hemispheres compatible with remote infarcts. Remote bilateral cerebellar infarcts present. Prominent vascular calcifications within the carotid siphons and vertebrobasilar system. Known small punctate acute infarcts not seen. No other acute large vessel territory infarct. No acute intracranial hemorrhage. No mass lesion, midline shift, or mass effect. No hydrocephalus. No extra-axial fluid collection. Scalp soft tissues demonstrate no acute abnormality. No acute abnormality about the globes and orbits. The opacification with expansion of the left sphenoid sinus, likely related to mucocele. Paranasal sinuses are otherwise clear. No mastoid effusion. Calvarium intact. CTA NECK Aortic arch: Study limited by motion artifact. Visualized aortic arch of normal caliber with normal branch pattern. No high-grade stenosis at the origin of the great vessels. Scattered atheromatous plaque within the arch itself. Visualized subclavian arteries patent. Right carotid system: Right common carotid artery patent from its origin to the bifurcation without stenosis. Vessel tortuosity noted. Mild plaque about the right bifurcation without stenosis. Right ICA widely patent to the skullbase without stenosis, dissection, or occlusion. Left carotid system: Left common carotid artery patent from its origin to the bifurcation without high-grade stenosis. Mild plaque about the left bifurcation/ proximal left ICA without high-grade stenosis. Left ICA mildly tortuous but patent distally to the skullbase without stenosis, dissection, or occlusion. Vertebral arteries:Both vertebral arteries arise from the subclavian arteries. Proximal vertebral arteries not well evaluated on this exam due to motion artifact. Vertebral arteries patent to the skullbase without obvious stenosis. Skeleton: Advanced multilevel degenerative spondylolysis within the cervical spine. No definite acute osseous abnormality. No worrisome lytic or  blastic osseous lesions. Other neck: Visualized lungs are clear. Mild emphysema. Visualized mediastinum grossly unremarkable. Thyroid grossly normal. No definite acute soft tissue abnormality within the neck, although evaluation limited by motion. CTA HEAD Anterior circulation: Petrous segments patent bilaterally. Multifocal calcified plaque present within the cavernous/ supraclinoid ICAs bilaterally, left worse than right. There is associated moderate multi focal narrowing on the right (approximately up to 50% or so). Moderate to severe multi focal narrowing present on the left (50-75%). A1 segments patent. Right A1 segment slightly diminutive. Anterior communicating artery normal. Anterior cerebral arteries demonstrate multifocal atheromatous irregularity with stenoses, particularly on the right where the distal vessel is attenuated as compared to the left. M1 segments patent without stenosis or occlusion. MCA bifurcations normal. MCA branches patent proximally, and grossly symmetric. Distal small vessel atheromatous irregularity. Posterior circulation: Left vertebral artery dominant. Mild focal plaque within the left V4 segment without significant stenosis. Focal plaque within the diminutive right vertebral artery as it crosses the dural margin without significant stenosis. Additional focal plaque distally within the right V4 segment with mild to moderate stenosis. Right posterior inferior cerebral artery patent. Left posterior inferior cerebral artery not well visualized. Basilar  artery tortuous but patent to its distal aspect. Focal plaque within the proximal- mid basilar artery with moderate stenosis (series 502, image 352). Superior cerebral arteries patent. Posterior cerebral arteries arise from the basilar artery and are opacified to their distal aspects. Venous sinuses: Grossly patent, although not well evaluated on this exam due to motion artifact and timing of the contrast bolus. Anatomic variants: No  anatomic variant. No aneurysm or vascular malformation. Delayed phase: No pathologic enhancement. IMPRESSION: CTA NECK IMPRESSION: 1. Mild atheromatous plaque about the carotid bifurcations bilaterally, left greater than right, without flow limiting stenosis. 2. Patent vertebral arteries within the neck. 3. Vessel tortuosity, suggestive of chronic underlying hypertension. 4. Mild emphysema. CTA HEAD IMPRESSION: 1. Negative for large vessel occlusion. 2. Multifocal atheromatous disease involving the anterior and posterior circulations as detailed above. Areas of involvement greatest within the cavernous ICAs, anterior cerebral arteries, and basilar artery. Electronically Signed   By: Rise Mu M.D.   On: 11/27/2015 06:20   Mr Brain Wo Contrast  Result Date: 11/26/2015 CLINICAL DATA:  Previous history of stroke. Right facial droop and mental status changes beginning yesterday. EXAM: MRI HEAD WITHOUT CONTRAST TECHNIQUE: Multiplanar, multiecho pulse sequences of the brain and surrounding structures were obtained without intravenous contrast. COMPARISON:  Head CT 11/25/2015.  MRI 04/02/2014. FINDINGS: Brain: The study suffers from motion degradation. There are 3 or 4 scattered punctate foci of restricted diffusion in the left hemisphere in the middle cerebral artery territory consistent with micro embolic infarctions. No large vessel territory acute infarction. The brainstem is normal. There are old small vessel cerebellar infarctions. There is extensive chronic small-vessel ischemic change affecting the thalamotomy, basal ganglia and throughout the cerebral hemispheric white matter. There is an old cortical infarction at the left insula and posterior frontal region. There is old cortical infarction in both occipital regions right more than left. There is old cortical infarction in the right frontal region. No evidence of mass lesion. No sign of acute hemorrhage. There are scattered foci of hemosiderin  deposition related to the old infarctions. No hydrocephalus. No extra-axial collection. No pituitary mass. Vascular: Major vessels at the base of the brain show flow. Skull and upper cervical spine: Negative Sinuses/Orbits: No significant sinus disease. No visible orbital pathology. Other: None IMPRESSION: Three or 4 scattered punctate acute infarctions in the left MCA territory consistent with micro embolic infarctions. No large infarction. No swelling or acute hemorrhage. 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 Extensive chronic small vessel ischemic changes elsewhere throughout the brain. Electronically Signed   By: Paulina Fusi M.D.   On: 11/26/2015 12:57   Mr Maxine Glenn Head/brain ZO Cm  Result Date: 11/27/2015 CLINICAL DATA:  Acute left MCA territory infarcts.  Abnormal MRI. EXAM: MRA HEAD WITHOUT CONTRAST TECHNIQUE: Angiographic images of the Circle of Willis were obtained using MRA technique without intravenous contrast. COMPARISON:  MRI brain 11/26/2015.  CTA head and neck 11/27/2015 FINDINGS: The study is moderately degraded by patient motion. There is good signal within the terminal ICA bilaterally. The A1 and M1 segments are intact. ACA and MCA branch vessels are poorly visualized. The proximal basilar artery is obscured. The left vertebral artery is the dominant vessel. Distal basilar artery is intact. Both posterior cerebral arteries originate the basilar tip. There is moderate attenuation of PCA branch vessels. IMPRESSION: 1. Moderate medium and distal small vessel disease as previously seen. 2. Findings are significantly exaggerated by severe patient motion. 3. Accounting for artifact, no significant proximal stenosis,  aneurysm, or branch vessel occlusion is evident. Electronically Signed   By: Marin Roberts M.D.   On: 11/27/2015 12:44        Scheduled Meds: .  stroke: mapping our early stages of recovery book   Does not apply Once  . [START ON  12/01/2015] sodium chloride   Intravenous Q14 Days  . [START ON 12/01/2015] acetaminophen (TYLENOL) oral liquid 160 mg/5 mL  500 mg Oral Q14 Days  . apixaban  5 mg Oral BID  . aspirin EC  81 mg Oral Daily  . atorvastatin  40 mg Oral q1800  . ceFEPime (MAXIPIME) IV  2 g Intravenous Q12H  . insulin aspart  0-9 Units Subcutaneous TID WC  . LORazepam  1 mg Intravenous Once  . mouth rinse  15 mL Mouth Rinse BID  . sertraline  75 mg Oral Daily  . sodium chloride flush  3 mL Intravenous Q12H  . vancomycin  500 mg Intravenous Q12H   Continuous Infusions:    LOS: 3 days    Time spent: 40 minutes    THOMPSON,DANIEL, MD [facility] Pager [phone]  If 7PM-7AM, please contact night-coverage [email or web address] Password [facility] 11/28/2015, 8:54 AM

## 2015-11-28 NOTE — Evaluation (Signed)
Physical Therapy Evaluation Patient Details Name: Rebecca [name] MRN: [id number] DOB: [birth year] Today's Date: 11/28/2015   History of Present Illness  ZNIYAH HINCE is a 70 y.o. 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 fib on our requests, hypertension, diabetes type 2, hyperlipidemia, systolic and diastolic heart failure, since emergency department from a nursing facility with the chief complaint of altered mental status. Initial evaluation concerning for sepsis related to healthcare associated pneumonia and elevated troponin concerning for ACS versus demand ischemia  Clinical Impression  Pt admitted with above diagnosis. Pt currently with functional limitations due to the deficits listed below (see PT Problem List). Pt needed 2 persons to assist to EOB and sat with max to min assist at EOB 5 min.  Pt will need SNF at d/c.   Pt will benefit from skilled PT to increase their independence and safety with mobility to allow discharge to the venue listed below.      Follow Up Recommendations SNF;Supervision/Assistance - 24 hour    Equipment Recommendations  None recommended by PT    Recommendations for Other Services       Precautions / Restrictions Precautions Precautions: Fall Restrictions Weight Bearing Restrictions: No      Mobility  Bed Mobility Overal bed mobility: Needs Assistance;+2 for physical assistance Bed Mobility: Supine to Sit     Supine to sit: Total assist;+2 for physical assistance     General bed mobility comments: Pt resisting movement to EOB requiring total assist with pt with heavy posterior lean.  Transfers                    Ambulation/Gait                Stairs            Wheelchair Mobility    Modified Rankin (Stroke Patients Only) Modified Rankin (Stroke Patients Only) Pre-Morbid Rankin Score: Moderately severe  disability Modified Rankin: Severe disability     Balance Overall balance assessment: Needs assistance Sitting-balance support: Bilateral upper extremity supported;Feet supported Sitting balance-Leahy Scale: Poor Sitting balance - Comments: Pt initially needed mod to max assist to sit EOB but progressed to where she sat about a minute with min guard assist.  Pt with posterior lean and lean to right.  Responded well to propping on left elbow.   Postural control: Posterior lean;Right lateral lean                                   Pertinent Vitals/Pain Pain Assessment: No/denies pain  VSS    Home Living Family/patient expects to be discharged to:: Skilled nursing facility     Type of Home: Skilled Nursing Facility           Additional Comments: Pt with aphasia per last admission per chart.    Prior Function Level of Independence: Needs assistance   Gait / Transfers Assistance Needed: per last admit, used wheelchair  ADL's / Homemaking Assistance Needed: unsure- pt not speaking and no family prsent        Hand Dominance        Extremity/Trunk Assessment   Upper Extremity Assessment: Defer to OT evaluation           Lower Extremity Assessment: RLE deficits/detail;LLE deficits/detail RLE Deficits / Details: tone noted.  Difficult to assess ROM and strength due to  extensor tone/rigidity and pt not following commands.  LLE Deficits / Details: tone noted, difficult to assess due to pt not following commands.  Cervical / Trunk Assessment: Kyphotic  Communication   Communication: Expressive difficulties  Cognition Arousal/Alertness: Lethargic Behavior During Therapy: Flat affect Overall Cognitive Status: Impaired/Different from baseline Area of Impairment: Attention;Following commands;Safety/judgement;Awareness;Problem solving       Following Commands: Follows one step commands inconsistently Safety/Judgement: Decreased awareness of  safety;Decreased awareness of deficits   Problem Solving: Slow processing;Difficulty sequencing;Requires verbal cues;Requires tactile cues General Comments: pt with poor response to commands.    General Comments General comments (skin integrity, edema, etc.): Positioned pt on pillows to protect heels as well as pillow to decr internal rotation of bil LEs.      Exercises        Assessment/Plan    PT Assessment Patient needs continued PT services  PT Diagnosis Generalized weakness   PT Problem List Decreased activity tolerance;Decreased balance;Decreased mobility;Decreased strength;Decreased range of motion;Decreased knowledge of use of DME;Decreased cognition;Decreased safety awareness;Decreased knowledge of precautions  PT Treatment Interventions DME instruction;Functional mobility training;Therapeutic activities;Therapeutic exercise;Balance training;Cognitive remediation;Patient/family education   PT Goals (Current goals can be found in the Care Plan section) Acute Rehab PT Goals Patient Stated Goal: unable to state PT Goal Formulation: Patient unable to participate in goal setting Time For Goal Achievement: 12/12/15 Potential to Achieve Goals: Good    Frequency Min 2X/week   Barriers to discharge Decreased caregiver support      Co-evaluation               End of Session Equipment Utilized During Treatment: Gait belt Activity Tolerance: Patient limited by fatigue Patient left: in bed;with call bell/phone within reach;with bed alarm set;with SCD's reapplied Nurse Communication: Mobility status;Need for lift equipment         Time: [id number] PT Time Calculation (min) (ACUTE ONLY): 11 min   Charges:   PT Evaluation $PT Eval Moderate Complexity: 1 Procedure     PT G CodesBerline Lopes:        Hydeia Mcatee F 11/28/2015, 3:55 PM [facility] Naketa Daddario,PT Acute Rehabilitation [phone] (pager)

## 2015-11-28 NOTE — Progress Notes (Signed)
Pharmacy Antibiotic Note  Rebecca [name] is a 70 y.o. female admitted on 11/25/2015 with AMS, CXR - Right lower lobe atelectasis or pneumonia now on cefepime D#4. Scr stable 0.84, est. crcl ~ 50 ml/min   Plan: Continue Cefepime 2g IV q12h Monitor culture data, renal function and clinical course   Weight: 138 lb (62.6 kg)  Temp (24hrs), Avg:98.6 F (37 C), Min:97.4 F (36.3 C), Max:99.5 F (37.5 C)   Recent Labs Lab 11/25/15 1050 11/25/15 1100 11/25/15 1110 11/25/15 1134 11/25/15 1517 11/25/15 1630 11/26/15 0256 11/26/15 0816 11/27/15 0534 11/28/15 0230  WBC 3.5*  --   --   --   --   --  3.5*  --  3.2* 3.8*  CREATININE  --  1.00 0.97  --   --   --  0.88  --  0.91 0.84  LATICACIDVEN  --   --   --  3.26* 2.8* 2.7*  --  1.5  --   --     Estimated Creatinine Clearance: 53 mL/min (by C-G formula based on SCr of 0.84 mg/dL).    Allergies  Allergen Reactions  . Lisinopril Swelling    Antimicrobials this admission: Vanc 9/7 >> 9/10 Cefepime 9/8 >>  Zosyn 9/7  Dose adjustments this admission: n/a  Microbiology results: 9/7 BCx: 1/2 CoNS 9/7 UCx: neg 9/7 Sputum: mixed flora, WBC  9/7 MRSA PCR: negative  Bayard Hugger, PharmD, BCPS  Clinical Pharmacist  Pager: [phone]   11/28/2015 10:51 AM

## 2015-11-28 NOTE — Progress Notes (Signed)
Patient Name: Rebecca [name] Date of Encounter: 11/28/2015  Hospital Problem List     Principal Problem:   Acute encephalopathy Active Problems:   Elevated troponin   Global aphasia   Paroxysmal atrial fibrillation (HCC)   Hyperlipidemia   Apraxia following CVA (cerebrovascular accident)   Dysphagia S/P CVA (cerebrovascular accident)   Type II diabetes mellitus with neurological manifestations (HCC)   Benign hypertensive heart disease without heart failure   Anemia, chronic disease   Systolic and diastolic CHF, chronic (HCC)   Fabry disease (HCC)   Hyperkalemia   HCAP (healthcare-associated pneumonia)   Cerebral embolism with cerebral infarction   NSTEMI (non-ST elevated myocardial infarction) [facility])    Patient Profile     Rebecca Boyett Williamsis a 70 y.o.femalewith medical history significant for augmentative impairment secondary to multiple strokes, CVA with residual expressive aphasia, A. fib, hypertension, diabetes type 2, hyperlipidemia, systolic and diastolic heart failure.  Sent to the emergency department from a nursing facility with the chief complaint of altered mental status. Initial evaluation concerning for sepsis related to healthcare associated pneumonia and elevated troponin.    Subjective   No acute distress.   Unable to wake her up  Inpatient Medications    .  stroke: mapping our early stages of recovery book   Does not apply Once  . [START ON 12/01/2015] sodium chloride   Intravenous Q14 Days  . [START ON 12/01/2015] acetaminophen (TYLENOL) oral liquid 160 mg/5 mL  500 mg Oral Q14 Days  . apixaban  5 mg Oral BID  . aspirin EC  81 mg Oral Daily  . atorvastatin  40 mg Oral q1800  . ceFEPime (MAXIPIME) IV  2 g Intravenous Q12H  . insulin aspart  0-9 Units Subcutaneous TID WC  . LORazepam  1 mg Intravenous Once  . mouth rinse  15 mL Mouth Rinse BID  . sertraline  75 mg Oral Daily  . sodium chloride flush  3 mL Intravenous Q12H    Vital Signs    Vitals:   11/27/15 2015 11/27/15 2318 11/28/15 0352 11/28/15 0848  BP: (!) 141/102 (!) 150/110 (!) 148/97 (!) 147/103  Pulse: 73 83 91 70  Resp: (!) 26 (!) 28 16 20   Temp:  98.8 F (37.1 C) 99.5 F (37.5 C) 99 F (37.2 C)  TempSrc:  Oral Oral Oral  SpO2: 99% 99% 99% 100%  Weight:        Intake/Output Summary (Last 24 hours) at 11/28/15 1031 Last data filed at 11/28/15 0540  Gross per 24 hour  Intake              340 ml  Output               50 ml  Net              290 ml   Filed Weights   11/25/15 1311  Weight: 138 lb (62.6 kg)    Physical Exam    GEN: Malnourished, in  no acute distress.  Neck: Supple, no JVD, carotid bruits, or masses. Cardiac: RRR,  rubs, positive S4. No clubbing, cyanosis, no edema.  Radials/DP/PT 2+ and equal bilaterally.  Respiratory:  Respirations  regular and unlabored, clear to auscultation bilaterally. GI: Soft, nontender, nondistended, BS + x 4. Neuro:  Patient does not wake up to follow commands.  Unable to assess neuro   Labs    CBC  Recent Labs  11/25/15 1050  11/27/15 0534 11/28/15 0230  WBC 3.5*  < >  3.2* 3.8*  NEUTROABS 2.0  --   --   --   HGB 10.8*  < > 9.0* 8.6*  HCT 36.2  < > 29.1* 28.5*  MCV 83.2  < > 80.4 80.3  PLT 281  < > 213 212  < > = values in this interval not displayed. Basic Metabolic Panel  Recent Labs  11/27/15 0534 11/28/15 0230  NA 144 143  K 3.9 3.8  CL 113* 115*  CO2 23 22  GLUCOSE 106* 109*  BUN 15 13  CREATININE 0.91 0.84  CALCIUM 8.8* 8.7*   Liver Function Tests  Recent Labs  11/25/15 1110  AST 31  ALT 13*  ALKPHOS 37*  BILITOT 0.9  PROT 7.7  ALBUMIN 3.2*   No results for input(s): LIPASE, AMYLASE in the last 72 hours. Cardiac Enzymes  Recent Labs  11/25/15 1517 11/25/15 2005  TROPONINI 0.19* 0.19*   BNP Invalid input(s): POCBNP D-Dimer No results for input(s): DDIMER in the last 72 hours. Hemoglobin A1C No results for input(s): HGBA1C in the last 72 hours. Fasting  Lipid Panel  Recent Labs  11/27/15 0534  CHOL 133  HDL 52  LDLCALC 70  TRIG 54  CHOLHDL 2.6   Thyroid Function Tests No results for input(s): TSH, T4TOTAL, T3FREE, THYROIDAB in the last 72 hours.  Invalid input(s): FREET3  Telemetry    NSR, with PACs.    ECG    NA  Radiology    Ct Angio Head W Or Wo Contrast  Result Date: 11/27/2015 CLINICAL DATA:  Initial evaluation for acute stroke. EXAM: CT ANGIOGRAPHY HEAD AND NECK TECHNIQUE: Multidetector CT imaging of the head and neck was performed using the standard protocol during bolus administration of intravenous contrast. Multiplanar CT image reconstructions and MIPs were obtained to evaluate the vascular anatomy. Carotid stenosis measurements (when applicable) are obtained utilizing NASCET criteria, using the distal internal carotid diameter as the denominator. CONTRAST:  50 cc of Isovue 370. COMPARISON:  Prior MRI from 11/26/2015. FINDINGS: CT HEAD Age-related cerebral atrophy present. Extensive chronic microvascular ischemic changes again seen. Scattered areas of encephalomalacia involving fourth cerebral hemispheres compatible with remote infarcts. Remote bilateral cerebellar infarcts present. Prominent vascular calcifications within the carotid siphons and vertebrobasilar system. Known small punctate acute infarcts not seen. No other acute large vessel territory infarct. No acute intracranial hemorrhage. No mass lesion, midline shift, or mass effect. No hydrocephalus. No extra-axial fluid collection. Scalp soft tissues demonstrate no acute abnormality. No acute abnormality about the globes and orbits. The opacification with expansion of the left sphenoid sinus, likely related to mucocele. Paranasal sinuses are otherwise clear. No mastoid effusion. Calvarium intact. CTA NECK Aortic arch: Study limited by motion artifact. Visualized aortic arch of normal caliber with normal branch pattern. No high-grade stenosis at the origin of the great  vessels. Scattered atheromatous plaque within the arch itself. Visualized subclavian arteries patent. Right carotid system: Right common carotid artery patent from its origin to the bifurcation without stenosis. Vessel tortuosity noted. Mild plaque about the right bifurcation without stenosis. Right ICA widely patent to the skullbase without stenosis, dissection, or occlusion. Left carotid system: Left common carotid artery patent from its origin to the bifurcation without high-grade stenosis. Mild plaque about the left bifurcation/ proximal left ICA without high-grade stenosis. Left ICA mildly tortuous but patent distally to the skullbase without stenosis, dissection, or occlusion. Vertebral arteries:Both vertebral arteries arise from the subclavian arteries. Proximal vertebral arteries not well evaluated on this exam due to motion artifact.  Vertebral arteries patent to the skullbase without obvious stenosis. Skeleton: Advanced multilevel degenerative spondylolysis within the cervical spine. No definite acute osseous abnormality. No worrisome lytic or blastic osseous lesions. Other neck: Visualized lungs are clear. Mild emphysema. Visualized mediastinum grossly unremarkable. Thyroid grossly normal. No definite acute soft tissue abnormality within the neck, although evaluation limited by motion. CTA HEAD Anterior circulation: Petrous segments patent bilaterally. Multifocal calcified plaque present within the cavernous/ supraclinoid ICAs bilaterally, left worse than right. There is associated moderate multi focal narrowing on the right (approximately up to 50% or so). Moderate to severe multi focal narrowing present on the left (50-75%). A1 segments patent. Right A1 segment slightly diminutive. Anterior communicating artery normal. Anterior cerebral arteries demonstrate multifocal atheromatous irregularity with stenoses, particularly on the right where the distal vessel is attenuated as compared to the left. M1  segments patent without stenosis or occlusion. MCA bifurcations normal. MCA branches patent proximally, and grossly symmetric. Distal small vessel atheromatous irregularity. Posterior circulation: Left vertebral artery dominant. Mild focal plaque within the left V4 segment without significant stenosis. Focal plaque within the diminutive right vertebral artery as it crosses the dural margin without significant stenosis. Additional focal plaque distally within the right V4 segment with mild to moderate stenosis. Right posterior inferior cerebral artery patent. Left posterior inferior cerebral artery not well visualized. Basilar artery tortuous but patent to its distal aspect. Focal plaque within the proximal- mid basilar artery with moderate stenosis (series 502, image 352). Superior cerebral arteries patent. Posterior cerebral arteries arise from the basilar artery and are opacified to their distal aspects. Venous sinuses: Grossly patent, although not well evaluated on this exam due to motion artifact and timing of the contrast bolus. Anatomic variants: No anatomic variant. No aneurysm or vascular malformation. Delayed phase: No pathologic enhancement. IMPRESSION: CTA NECK IMPRESSION: 1. Mild atheromatous plaque about the carotid bifurcations bilaterally, left greater than right, without flow limiting stenosis. 2. Patent vertebral arteries within the neck. 3. Vessel tortuosity, suggestive of chronic underlying hypertension. 4. Mild emphysema. CTA HEAD IMPRESSION: 1. Negative for large vessel occlusion. 2. Multifocal atheromatous disease involving the anterior and posterior circulations as detailed above. Areas of involvement greatest within the cavernous ICAs, anterior cerebral arteries, and basilar artery. Electronically Signed   By: Rise MuBenjamin  McClintock M.D.   On: 11/27/2015 06:20   Ct Head Wo Contrast  Result Date: 11/25/2015 CLINICAL DATA:  Right-sided facial droop EXAM: CT HEAD WITHOUT CONTRAST TECHNIQUE:  Contiguous axial images were obtained from the base of the skull through the vertex without intravenous contrast. COMPARISON:  October 11, 2015 FINDINGS: Brain: Mild diffuse atrophy remain stable. There is no intracranial mass, hemorrhage, extra-axial fluid collection, or midline shift. There is extensive small vessel disease throughout the centra semiovale bilaterally. Small vessel disease is noted in each thalamus. There is evidence of a prior infarct in the superior posterior left frontal lobe. There is evidence of a prior infarct on the right at the parieto-occipital junction. There is evidence of a prior small infarct in the posterior superior right cerebellum. No new gray-white compartment lesions are evident. No acute infarct is demonstrable compared to prior study. Vascular: There is no hyperdense vessel appreciable. There are focal areas of calcification in the carotid siphons bilaterally as well as in the distal vertebral artery bilaterally and basilar artery regions. Skull: The bony calvarium appears intact. Sinuses/Orbits: Orbits appear symmetric bilaterally. There is opacification in the left sphenoid sinus. Other visualized paranasal sinuses are clear. Other: Mastoid air cells are clear.  IMPRESSION: Atrophy with prior infarcts and extensive small vessel disease, stable. No acute infarct evident. No intracranial mass, hemorrhage, or extra-axial fluid collection. There are multiple foci of arterial vascular calcification. There is sphenoid sinus opacification. Electronically Signed   By: Bretta Bang III M.D.   On: 11/25/2015 11:28   Ct Angio Neck W Or Wo Contrast  Result Date: 11/27/2015 CLINICAL DATA:  Initial evaluation for acute stroke. EXAM: CT ANGIOGRAPHY HEAD AND NECK TECHNIQUE: Multidetector CT imaging of the head and neck was performed using the standard protocol during bolus administration of intravenous contrast. Multiplanar CT image reconstructions and MIPs were obtained to evaluate the  vascular anatomy. Carotid stenosis measurements (when applicable) are obtained utilizing NASCET criteria, using the distal internal carotid diameter as the denominator. CONTRAST:  50 cc of Isovue 370. COMPARISON:  Prior MRI from 11/26/2015. FINDINGS: CT HEAD Age-related cerebral atrophy present. Extensive chronic microvascular ischemic changes again seen. Scattered areas of encephalomalacia involving fourth cerebral hemispheres compatible with remote infarcts. Remote bilateral cerebellar infarcts present. Prominent vascular calcifications within the carotid siphons and vertebrobasilar system. Known small punctate acute infarcts not seen. No other acute large vessel territory infarct. No acute intracranial hemorrhage. No mass lesion, midline shift, or mass effect. No hydrocephalus. No extra-axial fluid collection. Scalp soft tissues demonstrate no acute abnormality. No acute abnormality about the globes and orbits. The opacification with expansion of the left sphenoid sinus, likely related to mucocele. Paranasal sinuses are otherwise clear. No mastoid effusion. Calvarium intact. CTA NECK Aortic arch: Study limited by motion artifact. Visualized aortic arch of normal caliber with normal branch pattern. No high-grade stenosis at the origin of the great vessels. Scattered atheromatous plaque within the arch itself. Visualized subclavian arteries patent. Right carotid system: Right common carotid artery patent from its origin to the bifurcation without stenosis. Vessel tortuosity noted. Mild plaque about the right bifurcation without stenosis. Right ICA widely patent to the skullbase without stenosis, dissection, or occlusion. Left carotid system: Left common carotid artery patent from its origin to the bifurcation without high-grade stenosis. Mild plaque about the left bifurcation/ proximal left ICA without high-grade stenosis. Left ICA mildly tortuous but patent distally to the skullbase without stenosis, dissection,  or occlusion. Vertebral arteries:Both vertebral arteries arise from the subclavian arteries. Proximal vertebral arteries not well evaluated on this exam due to motion artifact. Vertebral arteries patent to the skullbase without obvious stenosis. Skeleton: Advanced multilevel degenerative spondylolysis within the cervical spine. No definite acute osseous abnormality. No worrisome lytic or blastic osseous lesions. Other neck: Visualized lungs are clear. Mild emphysema. Visualized mediastinum grossly unremarkable. Thyroid grossly normal. No definite acute soft tissue abnormality within the neck, although evaluation limited by motion. CTA HEAD Anterior circulation: Petrous segments patent bilaterally. Multifocal calcified plaque present within the cavernous/ supraclinoid ICAs bilaterally, left worse than right. There is associated moderate multi focal narrowing on the right (approximately up to 50% or so). Moderate to severe multi focal narrowing present on the left (50-75%). A1 segments patent. Right A1 segment slightly diminutive. Anterior communicating artery normal. Anterior cerebral arteries demonstrate multifocal atheromatous irregularity with stenoses, particularly on the right where the distal vessel is attenuated as compared to the left. M1 segments patent without stenosis or occlusion. MCA bifurcations normal. MCA branches patent proximally, and grossly symmetric. Distal small vessel atheromatous irregularity. Posterior circulation: Left vertebral artery dominant. Mild focal plaque within the left V4 segment without significant stenosis. Focal plaque within the diminutive right vertebral artery as it crosses the dural margin without  significant stenosis. Additional focal plaque distally within the right V4 segment with mild to moderate stenosis. Right posterior inferior cerebral artery patent. Left posterior inferior cerebral artery not well visualized. Basilar artery tortuous but patent to its distal aspect.  Focal plaque within the proximal- mid basilar artery with moderate stenosis (series 502, image 352). Superior cerebral arteries patent. Posterior cerebral arteries arise from the basilar artery and are opacified to their distal aspects. Venous sinuses: Grossly patent, although not well evaluated on this exam due to motion artifact and timing of the contrast bolus. Anatomic variants: No anatomic variant. No aneurysm or vascular malformation. Delayed phase: No pathologic enhancement. IMPRESSION: CTA NECK IMPRESSION: 1. Mild atheromatous plaque about the carotid bifurcations bilaterally, left greater than right, without flow limiting stenosis. 2. Patent vertebral arteries within the neck. 3. Vessel tortuosity, suggestive of chronic underlying hypertension. 4. Mild emphysema. CTA HEAD IMPRESSION: 1. Negative for large vessel occlusion. 2. Multifocal atheromatous disease involving the anterior and posterior circulations as detailed above. Areas of involvement greatest within the cavernous ICAs, anterior cerebral arteries, and basilar artery. Electronically Signed   By: Rise Mu M.D.   On: 11/27/2015 06:20   Mr Brain Wo Contrast  Result Date: 11/26/2015 CLINICAL DATA:  Previous history of stroke. Right facial droop and mental status changes beginning yesterday. EXAM: MRI HEAD WITHOUT CONTRAST TECHNIQUE: Multiplanar, multiecho pulse sequences of the brain and surrounding structures were obtained without intravenous contrast. COMPARISON:  Head CT 11/25/2015.  MRI 04/02/2014. FINDINGS: Brain: The study suffers from motion degradation. There are 3 or 4 scattered punctate foci of restricted diffusion in the left hemisphere in the middle cerebral artery territory consistent with micro embolic infarctions. No large vessel territory acute infarction. The brainstem is normal. There are old small vessel cerebellar infarctions. There is extensive chronic small-vessel ischemic change affecting the thalamotomy, basal  ganglia and throughout the cerebral hemispheric white matter. There is an old cortical infarction at the left insula and posterior frontal region. There is old cortical infarction in both occipital regions right more than left. There is old cortical infarction in the right frontal region. No evidence of mass lesion. No sign of acute hemorrhage. There are scattered foci of hemosiderin deposition related to the old infarctions. No hydrocephalus. No extra-axial collection. No pituitary mass. Vascular: Major vessels at the base of the brain show flow. Skull and upper cervical spine: Negative Sinuses/Orbits: No significant sinus disease. No visible orbital pathology. Other: None IMPRESSION: Three or 4 scattered punctate acute infarctions in the left MCA territory consistent with micro embolic infarctions. No large infarction. No swelling or acute hemorrhage. 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 Extensive chronic small vessel ischemic changes elsewhere throughout the brain. Electronically Signed   By: Paulina Fusi M.D.   On: 11/26/2015 12:57   Dg Chest Port 1 View  Result Date: 11/25/2015 CLINICAL DATA:  Weakness, altered mental status, history of diabetes, atrial fibrillation, previous CVA, current smoker. EXAM: PORTABLE CHEST 1 VIEW COMPARISON:  Chest x-ray of April 02, 2014 FINDINGS: The lungs are adequately inflated. There is increased density at the right lung base. There is no pleural effusion. The cardiac silhouette is enlarged but stable. The pulmonary vascularity is normal. There is calcification in the wall of the thoracic aorta. The bony thorax exhibits no acute abnormality. IMPRESSION: Right lower lobe atelectasis or pneumonia. Followup PA and lateral chest X-ray is recommended in 3-4 weeks following trial of antibiotic therapy to ensure resolution and exclude underlying malignancy. Stable  cardiomegaly without pulmonary vascular congestion. Aortic  atherosclerosis. Electronically Signed   By: David  [place] M.D.   On: 11/25/2015 11:04   Mr Maxine Glenn Head/brain ZO Cm  Result Date: 11/27/2015 CLINICAL DATA:  Acute left MCA territory infarcts.  Abnormal MRI. EXAM: MRA HEAD WITHOUT CONTRAST TECHNIQUE: Angiographic images of the Circle of Willis were obtained using MRA technique without intravenous contrast. COMPARISON:  MRI brain 11/26/2015.  CTA head and neck 11/27/2015 FINDINGS: The study is moderately degraded by patient motion. There is good signal within the terminal ICA bilaterally. The A1 and M1 segments are intact. ACA and MCA branch vessels are poorly visualized. The proximal basilar artery is obscured. The left vertebral artery is the dominant vessel. Distal basilar artery is intact. Both posterior cerebral arteries originate the basilar tip. There is moderate attenuation of PCA branch vessels. IMPRESSION: 1. Moderate medium and distal small vessel disease as previously seen. 2. Findings are significantly exaggerated by severe patient motion. 3. Accounting for artifact, no significant proximal stenosis, aneurysm, or branch vessel occlusion is evident. Electronically Signed   By: Marin Roberts M.D.   On: 11/27/2015 12:44   ECHO:    - Procedure narrative: Transthoracic echocardiography. Image   quality was poor. The study was technically difficult, as a   result of poor patient compliance. - Left ventricle: The cavity size was normal. Wall thickness was   increased in a pattern of severe LVH. Systolic function was   moderately reduced. The estimated ejection fraction was in the   range of 35% to 40%. Diffuse hypokinesis. Findings consistent   with left ventricular diastolic dysfunction. Doppler parameters   are consistent with high ventricular filling pressure. - Ventricular septum: The contour showed systolic flattening. These   changes are consistent with RV pressure overload. - Aortic valve: Trileaflet; mildly calcified leaflets.    Morphologically, there appeared to be mild aortic stenosis. There   was mild regurgitation. - Aorta: Mild aortic root dilatation. Aortic root dimension: 43 mm   (ED). - Mitral valve: There was moderate regurgitation. - Left atrium: The atrium was severely dilated. - Right ventricle: Systolic function was moderately reduced. - Right atrium: The atrium was mildly dilated. - Atrial septum: No defect or patent foramen ovale was identified. - Tricuspid valve: There was mild-moderate regurgitation. - Pulmonary arteries: PA peak pressure: 44 mm Hg (S).   Assessment & Plan    ELEVATED TROPONIN:  Nonspecific pattern in a patient without obvious cardiac symptoms, acute EKG changes and multiple comorbid conditions that would preclude any invasive evaluation.  EF is slightly lower than previous on echo but this does not change the clinical picture.  Medical management only.  Restart the metoprolol that she was on previously.    PAF:  Continue Eliquis and ASA.      Signed, Rollene Rotunda, MD  11/28/2015, 10:31 AM

## 2015-11-28 NOTE — Progress Notes (Signed)
Patient is alert with eyes open looking around room, but is unable to answer any questions verbally or nonverbally. When asked a question patient just stares at nurse. She is unable to nod her head or point either. Patient could not hold either arm or leg up after nurse picked them up, No attempt made to touch finger or nose, knee to shin, wiggle toes, or close eyes, but did squeeze my fingers with both hands after 30-60 seconds of asking (very delayed). When giving oral medication patient held it in her mouth for a minute until cued to swallow. No coughing noted, just made a face like it tasted bad. Flat affect and right facial droop noted when she smiled after RN requested her to. Remains incontinent of B&B. Q 2 hr turn with heels and elbows off bed. Unable to communicate needs. Bed in low position, bed alarm on. Will continue to monitor closely.

## 2015-11-29 DIAGNOSIS — I63412 Cerebral infarction due to embolism of left middle cerebral artery: Secondary | ICD-10-CM

## 2015-11-29 LAB — BASIC METABOLIC PANEL
ANION GAP: 10 (ref 5–15)
BUN: 21 mg/dL — ABNORMAL HIGH (ref 6–20)
CO2: 19 mmol/L — ABNORMAL LOW (ref 22–32)
Calcium: 9.2 mg/dL (ref 8.9–10.3)
Chloride: 114 mmol/L — ABNORMAL HIGH (ref 101–111)
Creatinine, Ser: 1.03 mg/dL — ABNORMAL HIGH (ref 0.44–1.00)
GFR calc Af Amer: >60 mL/min (ref >60)
GFR, EST NON AFRICAN AMERICAN: 53 mL/min — AB (ref >60)
Glucose, Bld: 99 mg/dL (ref 65–99)
POTASSIUM: 4.4 mmol/L (ref 3.5–5.1)
SODIUM: 143 mmol/L (ref 135–145)

## 2015-11-29 LAB — CBC
HCT: 28.2 % — ABNORMAL LOW (ref 36.0–46.0)
Hemoglobin: 8.9 g/dL — ABNORMAL LOW (ref 12.0–15.0)
MCH: 25.3 pg — ABNORMAL LOW (ref 26.0–34.0)
MCHC: 31.6 g/dL (ref 30.0–36.0)
MCV: 80.1 fL (ref 78.0–100.0)
PLATELETS: 190 10*3/uL (ref 150–400)
RBC: 3.52 MIL/uL — AB (ref 3.87–5.11)
RDW: 19.5 % — ABNORMAL HIGH (ref 11.5–15.5)
WBC: 4.2 10*3/uL (ref 4.0–10.5)

## 2015-11-29 LAB — GLUCOSE, CAPILLARY
GLUCOSE-CAPILLARY: 115 mg/dL — AB (ref 65–99)
GLUCOSE-CAPILLARY: 103 mg/dL — AB (ref 65–99)
GLUCOSE-CAPILLARY: 106 mg/dL — AB (ref 65–99)
GLUCOSE-CAPILLARY: 109 mg/dL — AB (ref 65–99)

## 2015-11-29 LAB — MAGNESIUM: MAGNESIUM: 1.9 mg/dL (ref 1.7–2.4)

## 2015-11-29 MED ORDER — WHITE PETROLATUM GEL
Status: AC
  Administered 2015-11-29: 21:00:00
  Filled 2015-11-29: qty 1

## 2015-11-29 MED ORDER — IOPAMIDOL (ISOVUE-370) INJECTION 76%
50.0000 mL | Freq: Once | INTRAVENOUS | Status: AC | PRN
  Administered 2015-11-27: 50 mL via INTRAVENOUS

## 2015-11-29 MED ORDER — CEFEPIME HCL 2 G IJ SOLR
2.0000 g | INTRAMUSCULAR | Status: DC
  Administered 2015-11-30: 2 g via INTRAVENOUS
  Filled 2015-11-29 (×2): qty 2

## 2015-11-29 MED ORDER — METOPROLOL TARTRATE 5 MG/5ML IV SOLN
2.5000 mg | Freq: Three times a day (TID) | INTRAVENOUS | Status: DC
  Administered 2015-11-29 – 2015-11-30 (×4): 2.5 mg via INTRAVENOUS
  Filled 2015-11-29 (×4): qty 5

## 2015-11-29 NOTE — Progress Notes (Signed)
Speech Language Pathology Treatment: Dysphagia;Cognitive-Linquistic  Patient Details Name: Rebecca [name] MRN: [id number] DOB: 17-Jul-1945 Today's Date: 11/29/2015 Time: 3559-7416 SLP Time Calculation (min) (ACUTE ONLY): 16 min  Assessment / Plan / Recommendation Clinical Impression  Session focused on cognition/speech/swallow. Pt occasionally opened eyes on command; no attempts to vocalize. RN present and provided 1/2 small pill in applesauce however kept teeth/gums together not accepting bolus with multiple attempts and max multimodal cues (labial residue). Did not follow additional commands with max cues and unable to elicit speech (SLP noted baseline of severe CVA with minimal interaction prior). Will continue ST.     HPI HPI: 70 year old female with complicated past medical history significant for CVA (with severe residual deficits rendering her aphasic and minimally interactive in SNF), DM, Fabry disease, and mixed CHF. She presents 9/7 with R facial droop and AMS with CT head negative. CXR with questionable hazy opacification in RLL. Since admission she has been experiencing periods of apnea and bradycardia. Pt with h/o dysphagia from prior CVA with MBS 03/2014 recommending Dys 1 diet and nectar thick liquids due to silent aspiration of thin liquids. Pt did not advance diet while on CIR, which appears to be in part due to unwillingness to try advanced textures. Pt was re-evaluated at bedside by SLP  in 07/2015 wtih recommendations for Dys 3 diet and thin liquids.      SLP Plan  Continue with current plan of care     Recommendations  Diet recommendations: Dysphagia 1 (puree);Nectar-thick liquid Liquids provided via: Cup Medication Administration: Crushed with puree Supervision: Staff to assist with self feeding;Full supervision/cueing for compensatory strategies Compensations: Slow rate;Small sips/bites;Minimize environmental distractions Postural Changes and/or Swallow Maneuvers:  Seated upright 90 degrees             Oral Care Recommendations: Oral care BID Follow up Recommendations: Skilled Nursing facility Plan: Continue with current plan of care     GO                [name] 11/29/2015, 11:04 AM   Breck Coons Lonell Face.Ed [employer] [phone]

## 2015-11-29 NOTE — Progress Notes (Signed)
RN Accompanied SLP into patient's room to witness swallow assessment and retry administering morning meds. Pt is still unable/unwilling to follow commands in order to take meds. Pt would not unclench jaw to allow food to enter mouth when offered apple sauce and nectar thick juice. RN will inform MD of situation.

## 2015-11-29 NOTE — Progress Notes (Signed)
After reviewing pts chart, pt comes from [facility] and will return there.  Was dependent on arrival.  Will defer this OT eval to SNF. Tory Emerald, OTR/l [phone]

## 2015-11-29 NOTE — Progress Notes (Addendum)
PROGRESS NOTE    Rebecca [name]  ZOX:[id number] DOB: 02-May-1945 DOA: 11/25/2015 PCP: Merrilee Seashore, MD   Brief Narrative:  Rebecca [name] is a 70 y.o. 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 fib on our requests, hypertension, diabetes type 2, hyperlipidemia, systolic and diastolic heart failure, since emergency department from a nursing facility with the chief complaint of altered mental status. Initial evaluation concerning for sepsis related to healthcare associated pneumonia and elevated troponin concerning for ACS versus demand ischemia  Information is obtained from the staff and the chart as the patient remains somewhat encephalopathic. Ports indicate patient found unresponsive this morning. Poorly patient was at her baseline mental state at bedtime last night which is follow commands but mostly nonverbal. She was awakened this morning staff noted her leaning to the right not following commands. She was sent to the emergency department for acute encephalopathy. No report of any recent fever chills diarrhea vomiting. She is mostly bedbound in the facility and nonverbal. Patient placed empirically on IV antibiotics of vancomycin and cefepime for healthcare associated pneumonia versus aspiration pneumonia. Patient with clinical improvement. MRI of the head which was done was consistent with acute infarction. Stroke workup underway. Neurology consulted.     Assessment & Plan:   Principal Problem:   Acute encephalopathy Active Problems:   Cerebral embolism with cerebral infarction   HCAP (healthcare-associated pneumonia)   Elevated troponin   Global aphasia   Paroxysmal atrial fibrillation (HCC)   Hyperlipidemia   Apraxia following CVA (cerebrovascular accident)   Dysphagia S/P CVA (cerebrovascular accident)   Type II diabetes mellitus with neurological manifestations (HCC)   Benign hypertensive heart disease without heart failure   Anemia, chronic disease   Systolic and diastolic CHF, chronic (HCC)   Fabry disease (HCC)   Hyperkalemia   NSTEMI (non-ST elevated myocardial infarction) (HCC)  #1 acute encephalopathy Likely multifactorial secondary to acute infarcts noted on MRI head and also metabolic in nature secondary to healthcare associated pneumonia versus aspiration pneumonia. Patient noted on admission to have a right facial droop from the nursing facility. Concern for also possible seizures as patient was noted to be apneic and bradycardic overnight without hypotension. EEG which was done demonstrated mild diffuse slowing of electrocerebral activity. No epileptiform activity was recorded. Patient with clinical improvement and likely close to baseline per son yesterday. Patient has been pancultured results pending. CT head with no acute abnormalities. MRI head with 3- 4 scattered punctuate acute infarctions in the left MCA territory consistent with microembolic infarctions. No large infarction. No swelling or acute hemorrhage. Old cortical infarct affecting both cerebral hemispheres noted. Extensive chronic small vessel ischemic changes elsewhere throughout the brain. MRA of the head with moderate medium in distal small vessel disease as previously seen. No significant proximal stenosis, aneurysm or branch vessel occlusion evident.  2-D echo with EF of 35-40%. Diffuse hypokinesis. Left atrium severely dilated. Right ventricular systolic function moderately reduced.  CT angiogram head and neck with no large vessel occlusion, atherosclerosis bilaterally ICA cavernous segment.  Continue eliquis and baby aspirin 81 mg, for secondary stroke prevention. PT/OT /ST. Neurology has been consulted and following. Continue empiric IV cefepime. Follow.  #2 acute CVA  MRI head with 3- 4 scattered punctuate acute infarctions in the left MCA territory consistent with microembolic  infarctions. No large infarction. No swelling or acute hemorrhage. Old cortical infarct affecting both cerebral hemispheres noted. Extensive chronic small vessel ischemic changes  elsewhere throughout the brain.  MRA head with moderate medium in distal small vessel disease. No significant proximal stenosis, aneurysm or branch vessel occlusion is evident.  2-D echo with EF of 35-40% with diffuse hypokinesis. Left ventricular diastolic dysfunction. Right ventricle systolic function moderately reduced. Severely dilated left atrium. Mildly dilated right atrium. No source of emboli noted. CT angiogram head and neck with no large vessel occlusion, atherosclerosis bilaterally ICA cavernous segment. LDL of 70. Patient has been seen by neurology and aspirin 81 mg daily has been added to eliquis for further protection and secondary stroke prophylaxis. PT/OT/ST. neurology following.   #3 healthcare associated pneumonia versus aspiration pneumonia Per chest x-ray. Patient was on regular diet with thin liquids prior to admission. Patient has been assessed by speech therapy and recommended a dysphagia 1 diet with nectar thick liquids. Lactic acid levels trending down. Sputum Gram stain and cultures pending. Urine strep pneumococcus antigen is negative. Urine Legionella antigen pending. Blood cultures preliminary with 1 out of 2 with coagulase negative staph. Continue empiric IV cefepime. DC'd IV vancomycin. Follow.  #4 chronic systolic and diastolic CHF Stable. 2-D echo EF 35-40%. Diffuse hypokinesis. Beta blocker resumed per cardiology. Patient with allergy to ace inhibitors. Continue Lipitor, aspirin,eliquis.  #5 elevated troponin/?NSTEMI Felt to likely be a demand ischemia in the setting of pneumonia. Concern for probable non-STEMI as 2-D echo with a EF of 35-40%. Diffuse hypokinesis. Findings consistent with left ventricular diastolic dysfunction, right ventricular systolic function moderately reduced, left atrium  severely dilated, right atrium moderately dilated. Patient's beta blocker was held secondary to episodes of apnea and bradycardia. Unable to place on ACE inhibitor due to allergy. Continue baby aspirin 81 mg daily, eliquis, Lipitor. Metoprolol has been resumed per cardiology. Patient has been assessed per cardiology and it was felt no ischemic evaluation needed at this time. Cards following.  #6 paroxysmal atrial fibrillation CHA2DS2vASC = 6 Patient in normal sinus rhythm. Patient noted to have periods of apnea with bradycardia 3-4 nights ago, and as such beta blocker on hold. 2-D echo with EF of 35-40%. Diffuse hypokinesis. Left ventricular diastolic dysfunction. Right ventricular systolic function moderately reduced. Severely dilated left atrium. Patient has been resumed back on home regimen of metoprolol per cardiology. Continue ELIQUIS and aspirin for anticoagulation. Cardiology following.  #7 nonsustained V. tach Per nursing patient with 8 beat run of nonsustained V. tach. Patient asymptomatic. Per nursing patient noted to be pocketing pills and a such likely not swallowing pills. Patient on beta blocker. Nurse to reassess. Speech therapy following. If patient not swallowing pills may need to place on IV Lopressor. Keep potassium around 4. Keep magnesium at 2.  #8 history of CVA Continue eliquis and baby aspirin for secondary stroke prevention. Continue Lipitor.  #9 Fabry's disease  Outpatient follow-up.  #10 chronic systolic and diastolic heart failure Stable. 2-D echo with EF of 35-40%. Diffuse hypokinesis. Findings consistent with left ventricular diastolic dysfunction. Right ventricular systolic function moderately reduced. Left atrium severely dilated. Patient being seen by cardiology who are recommending medical management. Patient's metoprolol has been resumed per cardiology. Follow.  #11.  type 2 diabetes mellitus Hemoglobin A1c was 5.28 April 2015. CBGs 107-117. Continue sliding  scale insulin.   #12 prognosis Patient with a poor prognosis. Patient with recurrent acute CVAs despite being on anticoagulation. Patient with chronic systolic and diastolic heart failure with worsening ejection fraction EF of 35-40%. Patient is a phasic. Patient per nursing is pocketing food and with poor oral intake despite being  followed by speech therapy. Will consulted palliative care for goals of care.    DVT prophylaxis: eliquis Code Status: Full code for now. Son to discuss with his sister in terms of CODE STATUS. Family Communication: Updated son Charlean SanfilippoDaryn via telephone [phone] ).No family at bedside.  Disposition Plan: Back to skilled nursing facility when medically stable and back to baseline.   Consultants:   PCCM: Dr Isaiah SergeMannam 11/25/2015  Cardiology: Dr. Clifton JamesMcAlhany 11/25/2015  Neurology: Dr. Roseanne RenoStewart 11/26/2015  Procedures:   EEG 11/26/2015  MRI 11/26/2015  Chest x-ray 11/25/2015  CT head 11/25/2015  2-D echo 11/27/2015  MRA 11/27/2015  CT angiogram head and neck 11/27/2015  Antimicrobials:   IV vancomycin 11/25/2015>>>>> 11/28/2015  IV cefepime 11/25/2015   Subjective: Patient sleeping easily arousable by verbal stimuli. Drifts back off to sleep. Aphasic.  Per nursing patient with a peak run of V. tach this morning. Patient asymptomatic. Per nursing patient pocketing food and medications.  Objective: Vitals:   11/28/15 2140 11/29/15 0140 11/29/15 0618 11/29/15 1014  BP: (!) 150/94 (!) 143/90 (!) 140/97 (!) 138/93  Pulse: 82 86 81 69  Resp: 20 20 20 18   Temp: 99 F (37.2 C) 98.6 F (37 C) 100.2 F (37.9 C) 98.8 F (37.1 C)  TempSrc: Oral Oral Axillary Oral  SpO2: 98% 97% 100% 97%  Weight:        Intake/Output Summary (Last 24 hours) at 11/29/15 1028 Last data filed at 11/29/15 0900  Gross per 24 hour  Intake                0 ml  Output                0 ml  Net                0 ml   Filed Weights   11/25/15 1311  Weight: 62.6 kg (138 lb)     Examination:  General exam: Sleeping. Open his eyes to verbal stimuli but drifts back off to sleep.  Respiratory system: Clear to auscultation anterior lung fields. Respiratory effort normal. Cardiovascular system: Irregularly irregular. No JVD, murmurs, rubs, gallops or clicks. No pedal edema. Gastrointestinal system: Abdomen is nondistended, soft and nontender. No organomegaly or masses felt. Normal bowel sounds heard. Central nervous system: Alert and aphasic however following some commands and tracking appropriately.  Extremities: Symmetric 5 x 5 power. Skin: No rashes, lesions or ulcers Psychiatry: Judgement and insight appear normal. Mood & affect appropriate.     Data Reviewed: I have personally reviewed following labs and imaging studies  CBC:  Recent Labs Lab 11/25/15 1050 11/25/15 1100 11/25/15 1516 11/26/15 0256 11/27/15 0534 11/28/15 0230 11/29/15 0458  WBC 3.5*  --   --  3.5* 3.2* 3.8* 4.2  NEUTROABS 2.0  --   --   --   --   --   --   HGB 10.8* 12.6  --  9.2* 9.0* 8.6* 8.9*  HCT 36.2 37.0 31.8* 29.7* 29.1* 28.5* 28.2*  MCV 83.2  --   --  80.7 80.4 80.3 80.1  PLT 281  --   --  168 213 212 190   Basic Metabolic Panel:  Recent Labs Lab 11/25/15 1110 11/26/15 0256 11/27/15 0534 11/28/15 0230 11/29/15 0458  NA 143 143 144 143 143  K 4.2 5.1 3.9 3.8 4.4  CL 110 112* 113* 115* 114*  CO2 23 22 23 22  19*  GLUCOSE 99 89 106* 109* 99  BUN 17 14 15  13 21*  CREATININE 0.97 0.88 0.91 0.84 1.03*  CALCIUM 9.2 8.4* 8.8* 8.7* 9.2  MG  --   --   --   --  1.9   GFR: Estimated Creatinine Clearance: 43.3 mL/min (by C-G formula based on SCr of 1.03 mg/dL). Liver Function Tests:  Recent Labs Lab 11/25/15 1110  AST 31  ALT 13*  ALKPHOS 37*  BILITOT 0.9  PROT 7.7  ALBUMIN 3.2*   No results for input(s): LIPASE, AMYLASE in the last 168 hours.  Recent Labs Lab 11/27/15 0534  AMMONIA 37*   Coagulation Profile:  Recent Labs Lab 11/25/15 1110  INR  1.64   Cardiac Enzymes:  Recent Labs Lab 11/25/15 1517 11/25/15 2005  TROPONINI 0.19* 0.19*   BNP (last 3 results) No results for input(s): PROBNP in the last 8760 hours. HbA1C:  Recent Labs  11/27/15 0534  HGBA1C 5.8*   CBG:  Recent Labs Lab 11/28/15 0837 11/28/15 1206 11/28/15 1610 11/28/15 2240 11/29/15 0636  GLUCAP 107* 112* 107* 117* 115*   Lipid Profile:  Recent Labs  11/27/15 0534  CHOL 133  HDL 52  LDLCALC 70  TRIG 54  CHOLHDL 2.6   Thyroid Function Tests: No results for input(s): TSH, T4TOTAL, FREET4, T3FREE, THYROIDAB in the last 72 hours. Anemia Panel: No results for input(s): VITAMINB12, FOLATE, FERRITIN, TIBC, IRON, RETICCTPCT in the last 72 hours. Sepsis Labs:  Recent Labs Lab 11/25/15 1134 11/25/15 1517 11/25/15 1630 11/26/15 0816  LATICACIDVEN 3.26* 2.8* 2.7* 1.5    Recent Results (from the past 240 hour(s))  Culture, blood (Routine X 2) w Reflex to ID Panel     Status: Abnormal   Collection Time: 11/25/15 12:05 PM  Result Value Ref Range Status   Specimen Description BLOOD RIGHT ANTECUBITAL  Final   Special Requests IN PEDIATRIC BOTTLE  1CC  Final   Culture  Setup Time   Final    GRAM POSITIVE COCCI IN CLUSTERS IN PEDIATRIC BOTTLE CRITICAL RESULT CALLED TO, READ BACK BY AND VERIFIED WITH: N. Batchelder Pharm.D. 14:55 11/26/15 ([email or web address])    Culture (A)  Final    STAPHYLOCOCCUS SPECIES (COAGULASE NEGATIVE) THE SIGNIFICANCE OF ISOLATING THIS ORGANISM FROM A SINGLE SET OF BLOOD CULTURES WHEN MULTIPLE SETS ARE DRAWN IS UNCERTAIN. PLEASE NOTIFY THE MICROBIOLOGY DEPARTMENT WITHIN ONE WEEK IF SPECIATION AND SENSITIVITIES ARE REQUIRED.    Report Status 11/28/2015 FINAL  Final  Culture, blood (Routine X 2) w Reflex to ID Panel     Status: None (Preliminary result)   Collection Time: 11/25/15 12:28 PM  Result Value Ref Range Status   Specimen Description BLOOD RIGHT HAND  Final   Special Requests IN PEDIATRIC BOTTLE  1CC  Final   Culture  NO GROWTH 3 DAYS  Final   Report Status PENDING  Incomplete  Urine culture     Status: None   Collection Time: 11/25/15 12:43 PM  Result Value Ref Range Status   Specimen Description URINE, CATHETERIZED  Final   Special Requests NONE  Final   Culture NO GROWTH  Final   Report Status 11/26/2015 FINAL  Final  Gram stain     Status: None   Collection Time: 11/25/15 12:43 PM  Result Value Ref Range Status   Specimen Description URINE, CATHETERIZED  Final   Special Requests NONE  Final   Gram Stain   Final    WBC PRESENT,BOTH PMN AND MONONUCLEAR GRAM POSITIVE RODS GRAM POSITIVE COCCI IN CLUSTERS GRAM NEGATIVE RODS CYTOSPIN SMEAR  Report Status 11/25/2015 FINAL  Final  MRSA PCR Screening     Status: None   Collection Time: 11/25/15  2:52 PM  Result Value Ref Range Status   MRSA by PCR NEGATIVE NEGATIVE Final    Comment:        The GeneXpert MRSA Assay (FDA approved for NASAL specimens only), is one component of a comprehensive MRSA colonization surveillance program. It is not intended to diagnose MRSA infection nor to guide or monitor treatment for MRSA infections.          Radiology Studies: Mr Maxine Glenn Head/brain ZO Cm  Result Date: 11/27/2015 CLINICAL DATA:  Acute left MCA territory infarcts.  Abnormal MRI. EXAM: MRA HEAD WITHOUT CONTRAST TECHNIQUE: Angiographic images of the Circle of Willis were obtained using MRA technique without intravenous contrast. COMPARISON:  MRI brain 11/26/2015.  CTA head and neck 11/27/2015 FINDINGS: The study is moderately degraded by patient motion. There is good signal within the terminal ICA bilaterally. The A1 and M1 segments are intact. ACA and MCA branch vessels are poorly visualized. The proximal basilar artery is obscured. The left vertebral artery is the dominant vessel. Distal basilar artery is intact. Both posterior cerebral arteries originate the basilar tip. There is moderate attenuation of PCA branch vessels. IMPRESSION: 1. Moderate  medium and distal small vessel disease as previously seen. 2. Findings are significantly exaggerated by severe patient motion. 3. Accounting for artifact, no significant proximal stenosis, aneurysm, or branch vessel occlusion is evident. Electronically Signed   By: Marin Roberts M.D.   On: 11/27/2015 12:44        Scheduled Meds: .  stroke: mapping our early stages of recovery book   Does not apply Once  . [START ON 12/01/2015] sodium chloride   Intravenous Q14 Days  . [START ON 12/01/2015] acetaminophen (TYLENOL) oral liquid 160 mg/5 mL  500 mg Oral Q14 Days  . apixaban  5 mg Oral BID  . aspirin EC  81 mg Oral Daily  . atorvastatin  40 mg Oral q1800  . [START ON 11/30/2015] ceFEPime (MAXIPIME) IV  2 g Intravenous Q24H  . insulin aspart  0-9 Units Subcutaneous TID WC  . LORazepam  1 mg Intravenous Once  . mouth rinse  15 mL Mouth Rinse BID  . metoprolol tartrate  12.5 mg Oral BID  . sertraline  75 mg Oral Daily  . sodium chloride flush  3 mL Intravenous Q12H   Continuous Infusions:    LOS: 4 days    Time spent: 40 minutes    THOMPSON,DANIEL, MD [facility] Pager [phone]  If 7PM-7AM, please contact night-coverage [email or web address] Password [facility] 11/29/2015, 10:28 AM

## 2015-11-29 NOTE — Progress Notes (Signed)
Pt had 8 beat run of V-Tach according to tele monitor (informed by Dwayne). MD informed and updated on status of patient. Pt still appears lethargic but comfortable. Aroused pt to give morning meds. Pt opened eyes but still appeared lethargic and quickly drifted back to resting with eyes closed. Will try again to assess pt's swallowing ability and administer meds when patient is more alert.

## 2015-11-29 NOTE — Consult Note (Signed)
Consultation Note Date: 11/29/2015   Patient Name: Rebecca CarwinConstance E [name]  DOB: 04/17/1945  MRN: [id number]  Age / Sex: 70 y.o., female  PCP: Margit HanksAnne D Alexander, MD Referring Physician: Rodolph Bonganiel V Thompson, MD  Reason for Consultation: Establishing goals of care  HPI/Patient Profile: 70 y.o. female  with past medical history of Fabry's disease, DM, Afib, tobacco abuse, and CVA who was admitted on 11/25/2015 with acute encephalopathy and sepsis.  Work up revealed RLL pneumonia (HCAP vs Aspiration), and multiple punctate infarcts.  She is being treated with broad spectrum antibiotics for pneumonia.  She has been assessed by speech therapy and started on a dysphagia 1 diet.  Unfortunately she is not eating.  She was also seen by neurology who recommended that eliquis and 81 mg aspirin be continued along with lipitor.   Clinical Assessment and Goals of Care: I examined the patient at bedside.  She did not speak to me and would not open her eyes or follow commands.  Per the RN tech she has not eaten anything today and she sleeps almost all of the time.  I spoke with her son Rebecca Fearing(James?) on the phone.  Rebecca [name] mentioned that they lost his brother last month to a large stroke and he wondered if his mother was grieving that loss.  The brother had had a series of small strokes before the large one that took his life.  This was interesting as it is not unlike Rebecca [name].  I told him she was not eating and he was not surprised.  I mentioned that I was concerned she may be near end of life - and he was very surprised.  We discussed the process of slowly dwindling away with each little stroke and that the process of dying is not sudden in may people - rather they dwindle away.  He understood.  We discussed hospice services at the SNF on discharge.  He said he would discuss these things with his sister in [place].    He talked with his sister  last night about code status.  She wanted her mother to remain full code.  I will follow up with Rebecca [name] in person on 9/12 at 9:00 am.  We will discuss hospice services again.  I will also try to conference on his sister to help prepare her for the eventual passing of her mother and to attempt to change the patient's code status.   OTHER.  The patient's son is her Management consultantdecision maker.  He makes decisions jointly with his sister.    SUMMARY OF RECOMMENDATIONS    Consider Hospice Services at SNF  Will follow up with family regarding aspiration / dementia and code status.  Code Status/Advance Care Planning:  Full code    Symptom Management:   Per primary team.   Palliative Prophylaxis:   Aspiration, Bowel Regimen and Frequent Pain Assessment  Prognosis:   < 6 months.  Certainly much less if she will not take nutrition.  Discharge Planning: Skilled Nursing Facility with Hospice vs Palliative based  on what the family decides they want.      Primary Diagnoses: Present on Admission: . Acute encephalopathy . Anemia, chronic disease . Benign hypertensive heart disease without heart failure . Elevated troponin . Fabry disease (HCC) . Hyperlipidemia . Paroxysmal atrial fibrillation (HCC) . Systolic and diastolic CHF, chronic (HCC) . Type II diabetes mellitus with neurological manifestations (HCC) . Hyperkalemia . HCAP (healthcare-associated pneumonia) . Cerebral embolism with cerebral infarction . Global aphasia   I have reviewed the medical record, interviewed the patient and family, and examined the patient. The following aspects are pertinent.  Past Medical History:  Diagnosis Date  . Atrial fibrillation (HCC) 05/01/2013  . Depression   . Diabetes mellitus without complication (HCC)   . Expressive aphasia 03/23/2013  . 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 Fabry's disease (HCC)   . Hypertension   . Stroke (HCC)    2015  . Stroke [facility]) 2016   aphasia  . Tobacco abuse  05/01/2013   Social History   Social History  . Marital status: Single    Spouse name: Rebecca [name]  . Number of children: Rebecca [name]  . Years of education: Rebecca [name]   Social History Main Topics  . Smoking status: Never Smoker  . Smokeless tobacco: Never Used  . Alcohol use No  . Drug use: No  . Sexual activity: Not Currently   Other Topics Concern  . None   Social History Narrative  . None   Family History  Problem Relation Age of Onset  . Alcohol abuse Maternal Aunt    Scheduled Meds: .  stroke: mapping our early stages of recovery book   Does not apply Once  . [START ON 12/01/2015] sodium chloride   Intravenous Q14 Days  . [START ON 12/01/2015] acetaminophen (TYLENOL) oral liquid 160 mg/5 mL  500 mg Oral Q14 Days  . apixaban  5 mg Oral BID  . aspirin EC  81 mg Oral Daily  . atorvastatin  40 mg Oral q1800  . [START ON 11/30/2015] ceFEPime (MAXIPIME) IV  2 g Intravenous Q24H  . insulin aspart  0-9 Units Subcutaneous TID WC  . LORazepam  1 mg Intravenous Once  . mouth rinse  15 mL Mouth Rinse BID  . metoprolol  2.5 mg Intravenous Q8H  . sertraline  75 mg Oral Daily  . sodium chloride flush  3 mL Intravenous Q12H   Continuous Infusions:  PRN Meds:.acetaminophen **OR** acetaminophen, ibuprofen, ondansetron **OR** ondansetron (ZOFRAN) IV, polyethylene glycol, senna-docusate Medications Prior to Admission:  Prior to Admission medications   Medication Sig Start Date End Date Taking? Authorizing Provider  apixaban (ELIQUIS) 5 MG TABS tablet Take 5 mg by mouth 2 (two) times daily.    Yes Historical Provider, MD  atorvastatin (LIPITOR) 40 MG tablet Take 1 tablet (40 mg total) by mouth daily at 6 PM. 03/27/13  Yes Myra Rude, MD  metoprolol tartrate (LOPRESSOR) 25 MG tablet Take 0.5 tablets (12.5 mg total) by mouth 2 (two) times daily. 04/07/14  Yes Drema Dallas, MD  mirtazapine (REMERON) 7.5 MG tablet Take 7.5 mg by mouth at bedtime.   Yes Historical Provider, MD  moxifloxacin (AVELOX) 400 MG  tablet Take 400 mg by mouth daily at 8 pm. 11/24/15 12/03/15 Yes Historical Provider, MD  Multiple Vitamin (MULTI VITAMIN DAILY PO) Take 1 tablet by mouth daily.   Yes Historical Provider, MD  polyethylene glycol (MIRALAX / GLYCOLAX) packet Take 17 g by mouth daily.   Yes Historical Provider, MD  sertraline (  ZOLOFT) 50 MG tablet Take 75 mg by mouth daily.    Yes Historical Provider, MD  Skin Protectants, Misc. (CALAZIME SKIN PROTECTANT EX) Apply 1 application topically 2 (two) times daily. To buttock   Yes Historical Provider, MD   Allergies  Allergen Reactions  . Lisinopril Swelling   Review of Systems:  Patient not speaking.  Physical Exam  Frail, thin, female,  Non verbal, eyes closed. Patient became agitated on my exam (Breathing became briefly labored) CV irreg Abdomen thin, nt Extremities no edema.     Vital Signs: BP (!) 164/98 (BP Location: Right Arm)   Pulse 61   Temp 98.6 F (37 C) (Axillary)   Resp 18   Wt 62.6 kg (138 lb)   SpO2 98%   BMI 23.69 kg/m  Pain Assessment: Faces   Pain Score: 0-No pain   SpO2: SpO2: 98 % O2 Device:SpO2: 98 % O2 Flow Rate: .O2 Flow Rate (L/min): 2.5 L/min  IO: Intake/output summary:  Intake/Output Summary (Last 24 hours) at 11/29/15 1548 Last data filed at 11/29/15 1300  Gross per 24 hour  Intake                0 ml  Output                0 ml  Net                0 ml    LBM: Last BM Date: 11/28/15 Baseline Weight: Weight: 62.6 kg (138 lb) Most recent weight: Weight: 62.6 kg (138 lb)     Palliative Assessment/Data:   Flowsheet Rows   Flowsheet Row Most Recent Value  Intake Tab  Referral Department  Hospitalist  Unit at Time of Referral  Cardiac/Telemetry Unit  Palliative Care Primary Diagnosis  Neurology  Date Notified  11/29/15  Palliative Care Type  New Palliative care  Reason for referral  Clarify Goals of Care  Date of Admission  11/25/15  Date first seen by Palliative Care  11/29/15  # of days Palliative referral  response time  0 Day(s)  # of days IP prior to Palliative referral  4  Clinical Assessment  Palliative Performance Scale Score  10%  Psychosocial & Spiritual Assessment  Palliative Care Outcomes  Patient/Family meeting held?  Yes  Who was at the meeting?  patient and son on the phone.  Palliative Care Outcomes  Provided psychosocial or spiritual support, Counseled regarding hospice, Clarified goals of care      Time In: 3:00 Time Out: 4:10 Time Total: 70 min. Greater than 50%  of this time was spent counseling and coordinating care related to the above assessment and plan.  Signed by: Algis Downs, PA-C Palliative Medicine Pager: [phone]   Please contact Palliative Medicine Team phone at [phone] for questions and concerns.  For individual provider: See Loretha Stapler

## 2015-11-30 DIAGNOSIS — Z515 Encounter for palliative care: Secondary | ICD-10-CM

## 2015-11-30 DIAGNOSIS — R627 Adult failure to thrive: Secondary | ICD-10-CM

## 2015-11-30 DIAGNOSIS — Z7189 Other specified counseling: Secondary | ICD-10-CM

## 2015-11-30 DIAGNOSIS — D638 Anemia in other chronic diseases classified elsewhere: Secondary | ICD-10-CM

## 2015-11-30 DIAGNOSIS — R630 Anorexia: Secondary | ICD-10-CM

## 2015-11-30 DIAGNOSIS — F0280 Dementia in other diseases classified elsewhere without behavioral disturbance: Secondary | ICD-10-CM

## 2015-11-30 LAB — GLUCOSE, CAPILLARY
GLUCOSE-CAPILLARY: 113 mg/dL — AB (ref 65–99)
Glucose-Capillary: 114 mg/dL — ABNORMAL HIGH (ref 65–99)

## 2015-11-30 LAB — CBC
HEMATOCRIT: 30.5 % — AB (ref 36.0–46.0)
Hemoglobin: 9.3 g/dL — ABNORMAL LOW (ref 12.0–15.0)
MCH: 24.6 pg — ABNORMAL LOW (ref 26.0–34.0)
MCHC: 30.5 g/dL (ref 30.0–36.0)
MCV: 80.7 fL (ref 78.0–100.0)
Platelets: 161 10*3/uL (ref 150–400)
RBC: 3.78 MIL/uL — ABNORMAL LOW (ref 3.87–5.11)
RDW: 19.7 % — AB (ref 11.5–15.5)
WBC: 3.8 10*3/uL — ABNORMAL LOW (ref 4.0–10.5)

## 2015-11-30 LAB — BASIC METABOLIC PANEL
Anion gap: 11 (ref 5–15)
BUN: 29 mg/dL — AB (ref 6–20)
CO2: 18 mmol/L — ABNORMAL LOW (ref 22–32)
Calcium: 9.2 mg/dL (ref 8.9–10.3)
Chloride: 114 mmol/L — ABNORMAL HIGH (ref 101–111)
Creatinine, Ser: 0.95 mg/dL (ref 0.44–1.00)
GFR calc Af Amer: >60 mL/min (ref >60)
GFR, EST NON AFRICAN AMERICAN: 59 mL/min — AB (ref >60)
GLUCOSE: 109 mg/dL — AB (ref 65–99)
POTASSIUM: 4.3 mmol/L (ref 3.5–5.1)
Sodium: 143 mmol/L (ref 135–145)

## 2015-11-30 LAB — CULTURE, BLOOD (ROUTINE X 2): Culture: NO GROWTH

## 2015-11-30 MED ORDER — LORAZEPAM 2 MG/ML PO CONC: 1.0000 mg | Freq: Three times a day (TID) | ORAL | 0 refills | Status: DC | PRN

## 2015-11-30 MED ORDER — BISACODYL 10 MG RE SUPP: 10.0000 mg | RECTAL | 0 refills | Status: AC | PRN

## 2015-11-30 MED ORDER — ACETAMINOPHEN 325 MG PO TABS: 650.0000 mg | ORAL_TABLET | Freq: Four times a day (QID) | ORAL | Status: DC | PRN

## 2015-11-30 MED ORDER — MORPHINE SULFATE (CONCENTRATE) 10 MG/0.5ML PO SOLN: 5.0000 mg | ORAL | 0 refills | Status: DC | PRN

## 2015-11-30 MED ORDER — MORPHINE SULFATE (CONCENTRATE) 10 MG/0.5ML PO SOLN: 5.0000 mg | ORAL | Status: DC | PRN

## 2015-11-30 MED ORDER — ASPIRIN 81 MG PO TBEC: 81.0000 mg | DELAYED_RELEASE_TABLET | Freq: Every day | ORAL | Status: DC

## 2015-11-30 MED ORDER — HYDRALAZINE HCL 20 MG/ML IJ SOLN
10.0000 mg | Freq: Once | INTRAMUSCULAR | Status: AC
  Administered 2015-11-30: 10 mg via INTRAVENOUS
  Filled 2015-11-30: qty 1

## 2015-11-30 MED ORDER — ACETAMINOPHEN 650 MG RE SUPP: 650.0000 mg | Freq: Four times a day (QID) | RECTAL | 0 refills | Status: AC | PRN

## 2015-11-30 NOTE — NC FL2 (Signed)
  [place] MEDICAID FL2 LEVEL OF CARE SCREENING TOOL     IDENTIFICATION  Patient Name: Rebecca [name] Birthdate: [birth year] Sex: female Admission Date (Current Location): 11/25/2015  [facility] and [place] Number:  Producer, television/film/video and Address:  [facility] [place], [address] [place], [place] [address]      Provider Number: [id number]  Attending Physician Name and Address:  Rodolph Bong, MD  Relative Name and Phone Number:  [name], son, [phone]    Current Level of Care: Hospital Recommended Level of Care: Skilled Nursing Facility Prior Approval Number:    Date Approved/Denied:   PASRR Number: [id number]  Discharge Plan: SNF    Current Diagnoses: Patient Active Problem List   Diagnosis Date Noted  . FTT (failure to thrive) in adult 11/30/2015  . Cerebral infarction due to embolism of left middle cerebral artery (HCC)   . NSTEMI (non-ST elevated myocardial infarction) (HCC)   . Cerebral embolism with cerebral infarction 11/27/2015  . Acute encephalopathy 11/25/2015  . Hyperkalemia 11/25/2015  . HCAP (healthcare-associated pneumonia) 11/25/2015  . Community acquired pneumonia 11/25/2015  . 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 Systolic and diastolic CHF, chronic (HCC) 07/26/2015  . Anemia, chronic disease 07/23/2015  . Hematochezia 07/23/2015  . Lower GI bleed 07/18/2015  . Late effects of CVA (cerebrovascular accident) 07/06/2015  . UTI (urinary tract infection) 04/18/2015  . E. coli UTI 04/05/2015  . 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 Dysphagia S/P CVA (cerebrovascular accident) 04/22/2014  . 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 Global aphasia   . Paroxysmal atrial fibrillation (HCC)   . Hyperlipidemia   . Pulmonary hypertension (HCC)   . Elevated troponin 04/01/2014    Orientation  RESPIRATION BLADDER Height & Weight     Self  Normal Incontinent Weight: 138 lb (62.6 kg) Height:     BEHAVIORAL SYMPTOMS/MOOD NEUROLOGICAL BOWEL NUTRITION STATUS  Other (Comment) (none)   Incontinent Diet (DYS 1, Nectar Thick Liquids, Comfort Feeds)  AMBULATORY STATUS COMMUNICATION OF NEEDS Skin   Total Care Verbally Normal                       Personal Care Assistance Level of Assistance  Total care Bathing Assistance: Maximum assistance Feeding assistance: Maximum assistance Dressing Assistance: Maximum assistance Total Care Assistance: Maximum assistance   Functional Limitations Info  Sight, Hearing Sight Info: Impaired Hearing Info: Impaired Speech Info: Impaired    SPECIAL CARE FACTORS FREQUENCY                       Contractures Contractures Info: Not present    Additional Factors Info  Code Status, Allergies, Psychotropic, Isolation Precautions Code Status Info: Full Code Allergies Info: Lisinopril Psychotropic Info: Medications Insulin Sliding Scale Info: none Isolation Precautions Info: Contact Iso, MRSA, ESBL     Discharge Medications: Please see discharge summary for a list of discharge medications.  Relevant Imaging Results:  Relevant Lab Results:   Additional Information SSN:  [id number]   Pt will be followed by Hospice services at facility.   Dede Query, LCSW

## 2015-11-30 NOTE — Progress Notes (Signed)
BP 152/103, no medications scheduled at this time. On call paged, one time order hydralazine 10mg  IV received, as patient not tolerating PO meds at this time.  Will continue to monitor patient.

## 2015-11-30 NOTE — Progress Notes (Signed)
Physical Therapy Treatment Patient Details Name: Rebecca [name] MRN: [id number] DOB: 1945-03-24 Today's Date: 11/30/2015    History of Present Illness Rebecca [name] is a 70 y.o. 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 fib on our requests, hypertension, diabetes type 2, hyperlipidemia, systolic and diastolic heart failure, since emergency department from a nursing facility with the chief complaint of altered mental status. Initial evaluation concerning for sepsis related to healthcare associated pneumonia and elevated troponin concerning for ACS versus demand ischemia    PT Comments    Patient total +2 for bed mobility and with no verbal communication. Responded to name being called and followed tactile cues more so than verbal. Current plan remains appropriate.   Follow Up Recommendations  SNF;Supervision/Assistance - 24 hour     Equipment Recommendations  None recommended by PT    Recommendations for Other Services       Precautions / Restrictions Precautions Precautions: Fall Restrictions Weight Bearing Restrictions: No    Mobility  Bed Mobility Overal bed mobility: Needs Assistance;+2 for physical assistance Bed Mobility: Rolling Rolling: Total assist;+2 for physical assistance         General bed mobility comments: +2 to roll with use of bed pad to roll hips; pt responded to tactile cues more than verbal but multimodal cues given for sequencing; pt maintained flexed bilat UE with some R UE extension at times  Transfers                    Ambulation/Gait                 Stairs            Wheelchair Mobility    Modified Rankin (Stroke Patients Only) Modified Rankin (Stroke Patients Only) Pre-Morbid Rankin Score: Moderately severe disability Modified Rankin: Severe disability     Balance                                    Cognition Arousal/Alertness: Lethargic Behavior During Therapy: Flat affect Overall Cognitive Status: Impaired/Different from baseline Area of Impairment: Attention;Following commands   Current Attention Level: Focused   Following Commands: Follows one step commands inconsistently     Problem Solving: Requires verbal cues;Requires tactile cues General Comments: pt mainly kept eyes closed during session and with no verbal communication; pt responded to her name and able to track; responded to tactile cues more than verbal    Exercises      General Comments General comments (skin integrity, edema, etc.): variation of short and deep breaths throughout session      Pertinent Vitals/Pain Pain Assessment: Faces Faces Pain Scale: No hurt    Home Living                      Prior Function            PT Goals (current goals can now be found in the care plan section) Acute Rehab PT Goals Patient Stated Goal: unable to state PT Goal Formulation: Patient unable to participate in goal setting Time For Goal Achievement: 12/12/15 Potential to Achieve Goals: Good Progress towards PT goals: Not progressing toward goals - comment    Frequency  Min 2X/week    PT Plan Current plan remains appropriate    Co-evaluation  End of Session   Activity Tolerance: Patient tolerated treatment well Patient left: in bed;with call bell/phone within reach;with bed alarm set;with SCD's reapplied     Time: 1431-1451 PT Time Calculation (min) (ACUTE ONLY): 20 min  Charges:  $Therapeutic Activity: 8-22 mins                    G Codes:      Derek MoundKellyn R Velina Drollinger Anisten Tomassi, PTA Pager: [phone]   11/30/2015, 4:26 PM

## 2015-11-30 NOTE — Discharge Summary (Signed)
Physician Discharge Summary  Rebecca [name] KTG:[id number] DOB: [birth year] DOA: 11/25/2015  PCP: Inocencio Homes, MD  Admit date: 11/25/2015 Discharge date: 11/30/2015  Time spent: 65 minutes  Recommendations for Outpatient Follow-up:  1. Patient will be discharged to [facility] facility with hospice following. 2. Follow-up with M.D. at skilled nursing facility.   Discharge Diagnoses:  Principal Problem:   Acute encephalopathy Active Problems:   Cerebral embolism with cerebral infarction   HCAP (healthcare-associated pneumonia)   Elevated troponin   Global aphasia   Paroxysmal atrial fibrillation (HCC)   Hyperlipidemia   Apraxia following CVA (cerebrovascular accident)   Dysphagia S/P CVA (cerebrovascular accident)   Type II diabetes mellitus with neurological manifestations (HCC)   Benign hypertensive heart disease without heart failure   Anemia, chronic disease   Systolic and diastolic CHF, chronic (HCC)   Fabry disease (HCC)   Hyperkalemia   NSTEMI (non-ST elevated myocardial infarction) ([place])   Cerebral infarction due to embolism of left middle cerebral artery (HCC)   FTT (failure to thrive) in adult   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 Comfort feeds.  Filed Weights   11/25/15 1311  Weight: 62.6 kg (138 lb)    History of present illness:  Per Dr. Elwanda Brooklyn is a 70 y.o. 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 fib on our requests, hypertension, diabetes type 2, hyperlipidemia, systolic and diastolic heart failure, since emergency department from a nursing facility with the chief complaint of altered mental status. Initial evaluation  concerning for sepsis related to healthcare associated pneumonia and elevated troponin concerning for ACS versus demand ischemia  Information was obtained from the staff and the chart as the patient remained somewhat encephalopathic. Reports indicate patient found unresponsive on the morning of admission. Patient was at her baseline mental state at bedtime the night, prior to admission, which is following commands but mostly nonverbal. She was awakened the morning, staff noted her leaning to the right not following commands. She was sent to the emergency department for acute encephalopathy. No report of any recent fever chills diarrhea vomiting. She is mostly bedbound in the facility and nonverbal.  Hospital Course:  #1 acute encephalopathy Likely multifactorial secondary to acute infarcts noted on MRI head and also metabolic in nature secondary to healthcare associated pneumonia versus aspiration pneumonia. Patient noted on admission to have a right facial droop from the nursing facility. Concern for also possible seizures as patient was noted to be apneic and bradycardic overnight without hypotension. EEG which was done demonstrated mild diffuse slowing of electrocerebral activity. No epileptiform activity was recorded. Patient with clinical improvement and likely close to baseline per son yesterday. Patient has been pancultured results pending. CT head with no acute abnormalities. MRI head with 3- 4 scattered punctuate acute infarctions in the left MCA territory consistent with microembolic infarctions. No large infarction. No swelling or acute hemorrhage. Old cortical infarct affecting both cerebral hemispheres noted. Extensive chronic small vessel ischemic changes elsewhere throughout the brain. MRA of the head with moderate medium in distal small vessel disease as previously seen. No significant proximal stenosis, aneurysm or branch vessel occlusion evident.  2-D echo with EF of 35-40%. Diffuse  hypokinesis. Left atrium severely dilated. Right ventricular systolic function moderately  reduced.  CT angiogram head and neck with no large vessel occlusion, atherosclerosis bilaterally ICA cavernous segment.  Continued on eliquis and baby aspirin 81 mg, for secondary stroke prevention. PT/OT /ST. neurology followed the patient throughout the hospitalization. Patient received IV vancomycin and IV cefepime during the hospitalization and narrowed down to IV cefepime. Patient received a total of 5 days of IV antibiotics.  Patient was refusing oral intake. Patient was declining. Palliative care was consulted and patient was made full comfort measures. Patient be discharged to skilled nursing facility with hospice following.   #2 acute CVA  MRI head with 3- 4 scattered punctuate acute infarctions in the left MCA territory consistent with microembolic infarctions. No large infarction. No swelling or acute hemorrhage. Old cortical infarct affecting both cerebral hemispheres noted. Extensive chronic small vessel ischemic changes elsewhere throughout the brain.  MRA head with moderate medium in distal small vessel disease. No significant proximal stenosis, aneurysm or branch vessel occlusion is evident.  2-D echo with EF of 35-40% with diffuse hypokinesis. Left ventricular diastolic dysfunction. Right ventricle systolic function moderately reduced. Severely dilated left atrium. Mildly dilated right atrium. No source of emboli noted. CT angiogram head and neck with no large vessel occlusion, atherosclerosis bilaterally ICA cavernous segment. LDL of 70. Patient has been seen by neurology and aspirin 81 mg daily has been added to eliquis for further protection and secondary stroke prophylaxis.  Palliative care met with family and patient and was decided on comfort measures. Patient will be discharged to skilled nursing facility with hospice following on aspirin and eliquis for secondary stroke prevention if patient  is able to tolerate it. Patient's statin has been discontinued as patient is now comfort measures.   #3 healthcare associated pneumonia versus aspiration pneumonia Per chest x-ray. Patient was on regular diet with thin liquids prior to admission. Patient has been assessed by speech therapy and recommended a dysphagia 1 diet with nectar thick liquids. Lactic acid levels trending down. Sputum Gram stain and cultures pending. Urine strep pneumococcus antigen is negative. Urine Legionella antigen pending. Blood cultures with contaminant. Patient was placed empirically on IV vancomycin and IV cefepime. Patient remained afebrile. Patient improved clinically in terms of her pneumonia. IV vancomycin was discontinued. Patient was maintained on IV cefepime. Patient received a total of 5 days of antibiotic treatment. Patient was seen in consultation by palliative care and patient was made full comfort measures. Patient be discharged to skilled nursing facility with hospice. No further antibiotics needed.   #4 chronic systolic and diastolic CHF Stable. 2-D echo EF 35-40%. Diffuse hypokinesis. Beta blocker resumed per cardiology. Patient with allergy to ace inhibitors. Continued Lipitor, aspirin,eliquis. Patient was refusing oral intake and declining. Palliative care met with family and patient was made full comfort measures. Patient will be discharged to skilled nursing facility with hospice following.  #5 elevated troponin/?NSTEMI Felt to likely be a demand ischemia in the setting of pneumonia. Concern for probable non-STEMI as 2-D echo with a EF of 35-40%. Diffuse hypokinesis. Findings consistent with left ventricular diastolic dysfunction, right ventricular systolic function moderately reduced, left atrium severely dilated, right atrium moderately dilated. Patient's beta blocker was held secondary to episodes of apnea and bradycardia. Unable to place on ACE inhibitor due to allergy. Continued on baby aspirin  81 mg daily, eliquis, Lipitor. Metoprolol was resumed per cardiology. Patient has been assessed per cardiology and it was felt no ischemic evaluation needed at this time.   #6 paroxysmal atrial fibrillation CHA2DS2vASC = 6 Patient  in normal sinus rhythm. Patient noted to have periods of apnea with bradycardia during the first night of hospitalization, and as such patient's beta blocker was held.  2-D echo with EF of 35-40%. Diffuse hypokinesis. Left ventricular diastolic dysfunction. Right ventricular systolic function moderately reduced. Severely dilated left atrium. Patient has been resumed back on home regimen of metoprolol per cardiology. Patient was maintained on Continue ELIQUIS and aspirin for anticoagulation. patient started pocketing food and medications and refusing oral intake. Palliative care met with family and patient was placed on comfort measures. Patient will be discharged to skilled nursing facility with hospice.    #7 asymptomatic nonsustained V. tach Per nursing patient with 8 beat run of nonsustained V. tach. Patient asymptomatic. Per nursing patient noted to be pocketing pills and likely not swallowing pills. Patient on beta blocker. Patient was placed on IV Lopressor. Patient with poor prognosis and was declining and a such palliative care met with the family and patient is now comfort measures. Patient will be discharged on metoprolol orally if able to take it. Patient will follow with hospice or skilled nursing facility.   #8 history of CVA Patient was maintained on an liquids and a baby aspirin added to her regimen for secondary stroke prevention. Patient was also maintained on Lipitor during the hospitalization. Lipitor will be discontinued on discharge as patient is now comfort care.   #9 Fabry's disease  Due to patient's poor prognosis failure to thrive and declining health status palliative care met with the family patient is now comfort measures and will follow-up  with hospice at the skilled nursing facility. Patient will no longer require infusions.  #20 chronic systolic and diastolic heart failure Stable. 2-D echo with EF of 35-40%. Diffuse hypokinesis. Findings consistent with left ventricular diastolic dysfunction. Right ventricular systolic function moderately reduced. Left atrium severely dilated. Patient being seen by cardiology who are recommended medical management. Patient's metoprolol has been resumed per cardiology. Patient however with poor oral intake refusing to take her medications or food. Palliative care has met with the family and decided on hospice to follow the patient at skilled nursing facility for comfort measures. Patient will be discharged to skilled nursing facility with hospice following.   #11.  type 2 diabetes mellitus Hemoglobin A1c was 5.28 April 2015. CBGs 107-117. Patient was maintained on a sliding scale insulin. No insulin needed on discharge.  #12 FTT/ Poor prognosis Patient with a poor prognosis. Patient with recurrent acute CVAs despite being on anticoagulation. Patient with chronic systolic and diastolic heart failure with worsening ejection fraction EF of 35-40%. Patient is aphasic. Patient per nursing is pocketing food and with poor oral intake despite being followed by speech therapy. Patient also refusing to eat. Patient has been seen in consultation by palliative care who met with the family goals of care was discussed and it was decided that patient will be discharged to skilled nursing facility with hospice following.      Procedures:  EEG 11/26/2015  MRI 11/26/2015  Chest x-ray 11/25/2015  CT head 11/25/2015  2-D echo 11/27/2015  MRA 11/27/2015  CT angiogram head and neck 11/27/2015   Consultations:  PCCM: Dr Vaughan Browner 11/25/2015  Cardiology: Dr. Angelena Form 11/25/2015  Neurology: Dr. Nicole Kindred 11/26/2015  Palliative Care: Dr Wilford Corner, PA 11/29/2015   Discharge Exam: Vitals:    11/30/15 0211 11/30/15 0525  BP: (!) 138/91 139/87  Pulse:  66  Resp:  (!) 22  Temp:  97.7 F (36.5 C)    General: NAD  Cardiovascular: RRR Respiratory: CTAB anterior lung fields  Discharge Instructions   Discharge Instructions    Ambulatory referral to Neurology    Complete by:  As directed   Pt will follow up with Dr. Erlinda Hong at [facility] in about 2 months. Thanks.   Diet general    Complete by:  As directed   Dysphagia 1 diet with nectar thick liquids.   Increase activity slowly    Complete by:  As directed     Current Discharge Medication List    START taking these medications   Details  acetaminophen (TYLENOL) 325 MG tablet Take 2 tablets (650 mg total) by mouth every 6 (six) hours as needed for mild pain (or Fever >/= 101).    acetaminophen (TYLENOL) 650 MG suppository Place 1 suppository (650 mg total) rectally every 6 (six) hours as needed for mild pain (or Fever >/= 101). Qty: 12 suppository, Refills: 0    aspirin EC 81 MG EC tablet Take 1 tablet (81 mg total) by mouth daily.    bisacodyl (DULCOLAX) 10 MG suppository Place 1 suppository (10 mg total) rectally as needed for moderate constipation. Qty: 12 suppository, Refills: 0    LORazepam (ATIVAN) 2 MG/ML concentrated solution Take 0.5 mLs (1 mg total) by mouth every 8 (eight) hours as needed for anxiety, sedation or sleep (nausea). Qty: 30 mL, Refills: 0    Morphine Sulfate (MORPHINE CONCENTRATE) 10 MG/0.5ML SOLN concentrated solution Place 0.25 mLs (5 mg total) under the tongue every 2 (two) hours as needed for moderate pain or shortness of breath. Qty: 30 mL, Refills: 0      CONTINUE these medications which have NOT CHANGED   Details  apixaban (ELIQUIS) 5 MG TABS tablet Take 5 mg by mouth 2 (two) times daily.     metoprolol tartrate (LOPRESSOR) 25 MG tablet Take 0.5 tablets (12.5 mg total) by mouth 2 (two) times daily. Qty: 60 tablet, Refills: 0    mirtazapine (REMERON) 7.5 MG tablet Take 7.5 mg by mouth at  bedtime.    sertraline (ZOLOFT) 50 MG tablet Take 75 mg by mouth daily.     Skin Protectants, Misc. (CALAZIME SKIN PROTECTANT EX) Apply 1 application topically 2 (two) times daily. To buttock      STOP taking these medications     atorvastatin (LIPITOR) 40 MG tablet      moxifloxacin (AVELOX) 400 MG tablet      Multiple Vitamin (MULTI VITAMIN DAILY PO)      polyethylene glycol (MIRALAX / GLYCOLAX) packet      saccharomyces boulardii (FLORASTOR) 250 MG capsule        Allergies  Allergen Reactions  . Lisinopril Swelling    Contact information for follow-up providers    f/u with hospice at SNF .   Why:  f/u with hospice at SNF       f/u with MD at SNF .   Why:  f/u with MD at SNF           Contact information for after-discharge care    [facility] .   Specialty:  [place] information: [address] [phone]                   The results of significant diagnostics from this hospitalization (including imaging, microbiology, ancillary and laboratory) are listed below for reference.    Significant Diagnostic Studies: Ct Angio Head W Or  Wo Contrast  Result Date: 11/27/2015 CLINICAL DATA:  Initial evaluation for acute stroke. EXAM: CT ANGIOGRAPHY HEAD AND NECK TECHNIQUE: Multidetector CT imaging of the head and neck was performed using the standard protocol during bolus administration of intravenous contrast. Multiplanar CT image reconstructions and MIPs were obtained to evaluate the vascular anatomy. Carotid stenosis measurements (when applicable) are obtained utilizing NASCET criteria, using the distal internal carotid diameter as the denominator. CONTRAST:  50 cc of Isovue 370. COMPARISON:  Prior MRI from 11/26/2015. FINDINGS: CT HEAD Age-related cerebral atrophy present. Extensive chronic microvascular ischemic changes again seen. Scattered areas of  encephalomalacia involving fourth cerebral hemispheres compatible with remote infarcts. Remote bilateral cerebellar infarcts present. Prominent vascular calcifications within the carotid siphons and vertebrobasilar system. Known small punctate acute infarcts not seen. No other acute large vessel territory infarct. No acute intracranial hemorrhage. No mass lesion, midline shift, or mass effect. No hydrocephalus. No extra-axial fluid collection. Scalp soft tissues demonstrate no acute abnormality. No acute abnormality about the globes and orbits. The opacification with expansion of the left sphenoid sinus, likely related to mucocele. Paranasal sinuses are otherwise clear. No mastoid effusion. Calvarium intact. CTA NECK Aortic arch: Study limited by motion artifact. Visualized aortic arch of normal caliber with normal branch pattern. No high-grade stenosis at the origin of the great vessels. Scattered atheromatous plaque within the arch itself. Visualized subclavian arteries patent. Right carotid system: Right common carotid artery patent from its origin to the bifurcation without stenosis. Vessel tortuosity noted. Mild plaque about the right bifurcation without stenosis. Right ICA widely patent to the skullbase without stenosis, dissection, or occlusion. Left carotid system: Left common carotid artery patent from its origin to the bifurcation without high-grade stenosis. Mild plaque about the left bifurcation/ proximal left ICA without high-grade stenosis. Left ICA mildly tortuous but patent distally to the skullbase without stenosis, dissection, or occlusion. Vertebral arteries:Both vertebral arteries arise from the subclavian arteries. Proximal vertebral arteries not well evaluated on this exam due to motion artifact. Vertebral arteries patent to the skullbase without obvious stenosis. Skeleton: Advanced multilevel degenerative spondylolysis within the cervical spine. No definite acute osseous abnormality. No  worrisome lytic or blastic osseous lesions. Other neck: Visualized lungs are clear. Mild emphysema. Visualized mediastinum grossly unremarkable. Thyroid grossly normal. No definite acute soft tissue abnormality within the neck, although evaluation limited by motion. CTA HEAD Anterior circulation: Petrous segments patent bilaterally. Multifocal calcified plaque present within the cavernous/ supraclinoid ICAs bilaterally, left worse than right. There is associated moderate multi focal narrowing on the right (approximately up to 50% or so). Moderate to severe multi focal narrowing present on the left (50-75%). A1 segments patent. Right A1 segment slightly diminutive. Anterior communicating artery normal. Anterior cerebral arteries demonstrate multifocal atheromatous irregularity with stenoses, particularly on the right where the distal vessel is attenuated as compared to the left. M1 segments patent without stenosis or occlusion. MCA bifurcations normal. MCA branches patent proximally, and grossly symmetric. Distal small vessel atheromatous irregularity. Posterior circulation: Left vertebral artery dominant. Mild focal plaque within the left V4 segment without significant stenosis. Focal plaque within the diminutive right vertebral artery as it crosses the dural margin without significant stenosis. Additional focal plaque distally within the right V4 segment with mild to moderate stenosis. Right posterior inferior cerebral artery patent. Left posterior inferior cerebral artery not well visualized. Basilar artery tortuous but patent to its distal aspect. Focal plaque within the proximal- mid basilar artery with moderate stenosis (series 502, image 352). Superior cerebral arteries patent.  Posterior cerebral arteries arise from the basilar artery and are opacified to their distal aspects. Venous sinuses: Grossly patent, although not well evaluated on this exam due to motion artifact and timing of the contrast bolus.  Anatomic variants: No anatomic variant. No aneurysm or vascular malformation. Delayed phase: No pathologic enhancement. IMPRESSION: CTA NECK IMPRESSION: 1. Mild atheromatous plaque about the carotid bifurcations bilaterally, left greater than right, without flow limiting stenosis. 2. Patent vertebral arteries within the neck. 3. Vessel tortuosity, suggestive of chronic underlying hypertension. 4. Mild emphysema. CTA HEAD IMPRESSION: 1. Negative for large vessel occlusion. 2. Multifocal atheromatous disease involving the anterior and posterior circulations as detailed above. Areas of involvement greatest within the cavernous ICAs, anterior cerebral arteries, and basilar artery. Electronically Signed   By: Jeannine Boga M.D.   On: 11/27/2015 06:20   Ct Head Wo Contrast  Result Date: 11/25/2015 CLINICAL DATA:  Right-sided facial droop EXAM: CT HEAD WITHOUT CONTRAST TECHNIQUE: Contiguous axial images were obtained from the base of the skull through the vertex without intravenous contrast. COMPARISON:  October 11, 2015 FINDINGS: Brain: Mild diffuse atrophy remain stable. There is no intracranial mass, hemorrhage, extra-axial fluid collection, or midline shift. There is extensive small vessel disease throughout the centra semiovale bilaterally. Small vessel disease is noted in each thalamus. There is evidence of a prior infarct in the superior posterior left frontal lobe. There is evidence of a prior infarct on the right at the parieto-occipital junction. There is evidence of a prior small infarct in the posterior superior right cerebellum. No new gray-white compartment lesions are evident. No acute infarct is demonstrable compared to prior study. Vascular: There is no hyperdense vessel appreciable. There are focal areas of calcification in the carotid siphons bilaterally as well as in the distal vertebral artery bilaterally and basilar artery regions. Skull: The bony calvarium appears intact. Sinuses/Orbits:  Orbits appear symmetric bilaterally. There is opacification in the left sphenoid sinus. Other visualized paranasal sinuses are clear. Other: Mastoid air cells are clear. IMPRESSION: Atrophy with prior infarcts and extensive small vessel disease, stable. No acute infarct evident. No intracranial mass, hemorrhage, or extra-axial fluid collection. There are multiple foci of arterial vascular calcification. There is sphenoid sinus opacification. Electronically Signed   By: Lowella Grip III M.D.   On: 11/25/2015 11:28   Ct Angio Neck W Or Wo Contrast  Result Date: 11/27/2015 CLINICAL DATA:  Initial evaluation for acute stroke. EXAM: CT ANGIOGRAPHY HEAD AND NECK TECHNIQUE: Multidetector CT imaging of the head and neck was performed using the standard protocol during bolus administration of intravenous contrast. Multiplanar CT image reconstructions and MIPs were obtained to evaluate the vascular anatomy. Carotid stenosis measurements (when applicable) are obtained utilizing NASCET criteria, using the distal internal carotid diameter as the denominator. CONTRAST:  50 cc of Isovue 370. COMPARISON:  Prior MRI from 11/26/2015. FINDINGS: CT HEAD Age-related cerebral atrophy present. Extensive chronic microvascular ischemic changes again seen. Scattered areas of encephalomalacia involving fourth cerebral hemispheres compatible with remote infarcts. Remote bilateral cerebellar infarcts present. Prominent vascular calcifications within the carotid siphons and vertebrobasilar system. Known small punctate acute infarcts not seen. No other acute large vessel territory infarct. No acute intracranial hemorrhage. No mass lesion, midline shift, or mass effect. No hydrocephalus. No extra-axial fluid collection. Scalp soft tissues demonstrate no acute abnormality. No acute abnormality about the globes and orbits. The opacification with expansion of the left sphenoid sinus, likely related to mucocele. Paranasal sinuses are otherwise  clear. No mastoid effusion. Calvarium intact.  CTA NECK Aortic arch: Study limited by motion artifact. Visualized aortic arch of normal caliber with normal branch pattern. No high-grade stenosis at the origin of the great vessels. Scattered atheromatous plaque within the arch itself. Visualized subclavian arteries patent. Right carotid system: Right common carotid artery patent from its origin to the bifurcation without stenosis. Vessel tortuosity noted. Mild plaque about the right bifurcation without stenosis. Right ICA widely patent to the skullbase without stenosis, dissection, or occlusion. Left carotid system: Left common carotid artery patent from its origin to the bifurcation without high-grade stenosis. Mild plaque about the left bifurcation/ proximal left ICA without high-grade stenosis. Left ICA mildly tortuous but patent distally to the skullbase without stenosis, dissection, or occlusion. Vertebral arteries:Both vertebral arteries arise from the subclavian arteries. Proximal vertebral arteries not well evaluated on this exam due to motion artifact. Vertebral arteries patent to the skullbase without obvious stenosis. Skeleton: Advanced multilevel degenerative spondylolysis within the cervical spine. No definite acute osseous abnormality. No worrisome lytic or blastic osseous lesions. Other neck: Visualized lungs are clear. Mild emphysema. Visualized mediastinum grossly unremarkable. Thyroid grossly normal. No definite acute soft tissue abnormality within the neck, although evaluation limited by motion. CTA HEAD Anterior circulation: Petrous segments patent bilaterally. Multifocal calcified plaque present within the cavernous/ supraclinoid ICAs bilaterally, left worse than right. There is associated moderate multi focal narrowing on the right (approximately up to 50% or so). Moderate to severe multi focal narrowing present on the left (50-75%). A1 segments patent. Right A1 segment slightly diminutive.  Anterior communicating artery normal. Anterior cerebral arteries demonstrate multifocal atheromatous irregularity with stenoses, particularly on the right where the distal vessel is attenuated as compared to the left. M1 segments patent without stenosis or occlusion. MCA bifurcations normal. MCA branches patent proximally, and grossly symmetric. Distal small vessel atheromatous irregularity. Posterior circulation: Left vertebral artery dominant. Mild focal plaque within the left V4 segment without significant stenosis. Focal plaque within the diminutive right vertebral artery as it crosses the dural margin without significant stenosis. Additional focal plaque distally within the right V4 segment with mild to moderate stenosis. Right posterior inferior cerebral artery patent. Left posterior inferior cerebral artery not well visualized. Basilar artery tortuous but patent to its distal aspect. Focal plaque within the proximal- mid basilar artery with moderate stenosis (series 502, image 352). Superior cerebral arteries patent. Posterior cerebral arteries arise from the basilar artery and are opacified to their distal aspects. Venous sinuses: Grossly patent, although not well evaluated on this exam due to motion artifact and timing of the contrast bolus. Anatomic variants: No anatomic variant. No aneurysm or vascular malformation. Delayed phase: No pathologic enhancement. IMPRESSION: CTA NECK IMPRESSION: 1. Mild atheromatous plaque about the carotid bifurcations bilaterally, left greater than right, without flow limiting stenosis. 2. Patent vertebral arteries within the neck. 3. Vessel tortuosity, suggestive of chronic underlying hypertension. 4. Mild emphysema. CTA HEAD IMPRESSION: 1. Negative for large vessel occlusion. 2. Multifocal atheromatous disease involving the anterior and posterior circulations as detailed above. Areas of involvement greatest within the cavernous ICAs, anterior cerebral arteries, and basilar  artery. Electronically Signed   By: Jeannine Boga M.D.   On: 11/27/2015 06:20   Mr Brain Wo Contrast  Result Date: 11/26/2015 CLINICAL DATA:  Previous history of stroke. Right facial droop and mental status changes beginning yesterday. EXAM: MRI HEAD WITHOUT CONTRAST TECHNIQUE: Multiplanar, multiecho pulse sequences of the brain and surrounding structures were obtained without intravenous contrast. COMPARISON:  Head CT 11/25/2015.  MRI 04/02/2014. FINDINGS: Brain:  The study suffers from motion degradation. There are 3 or 4 scattered punctate foci of restricted diffusion in the left hemisphere in the middle cerebral artery territory consistent with micro embolic infarctions. No large vessel territory acute infarction. The brainstem is normal. There are old small vessel cerebellar infarctions. There is extensive chronic small-vessel ischemic change affecting the thalamotomy, basal ganglia and throughout the cerebral hemispheric white matter. There is an old cortical infarction at the left insula and posterior frontal region. There is old cortical infarction in both occipital regions right more than left. There is old cortical infarction in the right frontal region. No evidence of mass lesion. No sign of acute hemorrhage. There are scattered foci of hemosiderin deposition related to the old infarctions. No hydrocephalus. No extra-axial collection. No pituitary mass. Vascular: Major vessels at the base of the brain show flow. Skull and upper cervical spine: Negative Sinuses/Orbits: No significant sinus disease. No visible orbital pathology. Other: None IMPRESSION: Three or 4 scattered punctate acute infarctions in the left MCA territory consistent with micro embolic infarctions. No large infarction. No swelling or acute hemorrhage. 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 Extensive chronic small vessel ischemic changes elsewhere throughout the brain. Electronically Signed    By: Nelson Chimes M.D.   On: 11/26/2015 12:57   Dg Chest Port 1 View  Result Date: 11/25/2015 CLINICAL DATA:  Weakness, altered mental status, history of diabetes, atrial fibrillation, previous CVA, current smoker. EXAM: PORTABLE CHEST 1 VIEW COMPARISON:  Chest x-ray of April 02, 2014 FINDINGS: The lungs are adequately inflated. There is increased density at the right lung base. There is no pleural effusion. The cardiac silhouette is enlarged but stable. The pulmonary vascularity is normal. There is calcification in the wall of the thoracic aorta. The bony thorax exhibits no acute abnormality. IMPRESSION: Right lower lobe atelectasis or pneumonia. Followup PA and lateral chest X-ray is recommended in 3-4 weeks following trial of antibiotic therapy to ensure resolution and exclude underlying malignancy. Stable cardiomegaly without pulmonary vascular congestion. Aortic atherosclerosis. Electronically Signed   By: David  Martinique M.D.   On: 11/25/2015 11:04   Mr Jodene Nam Head/brain BF Cm  Result Date: 11/27/2015 CLINICAL DATA:  Acute left MCA territory infarcts.  Abnormal MRI. EXAM: MRA HEAD WITHOUT CONTRAST TECHNIQUE: Angiographic images of the Circle of Willis were obtained using MRA technique without intravenous contrast. COMPARISON:  MRI brain 11/26/2015.  CTA head and neck 11/27/2015 FINDINGS: The study is moderately degraded by patient motion. There is good signal within the terminal ICA bilaterally. The A1 and M1 segments are intact. ACA and MCA branch vessels are poorly visualized. The proximal basilar artery is obscured. The left vertebral artery is the dominant vessel. Distal basilar artery is intact. Both posterior cerebral arteries originate the basilar tip. There is moderate attenuation of PCA branch vessels. IMPRESSION: 1. Moderate medium and distal small vessel disease as previously seen. 2. Findings are significantly exaggerated by severe patient motion. 3. Accounting for artifact, no significant  proximal stenosis, aneurysm, or branch vessel occlusion is evident. Electronically Signed   By: San Morelle M.D.   On: 11/27/2015 12:44    Microbiology: Recent Results (from the past 240 hour(s))  Culture, blood (Routine X 2) w Reflex to ID Panel     Status: Abnormal   Collection Time: 11/25/15 12:05 PM  Result Value Ref Range Status   Specimen Description BLOOD RIGHT ANTECUBITAL  Final   Special Requests IN PEDIATRIC BOTTLE  [place]  Final  Culture  Setup Time   Final    GRAM POSITIVE COCCI IN CLUSTERS IN PEDIATRIC BOTTLE CRITICAL RESULT CALLED TO, READ BACK BY AND VERIFIED WITH: N. Batchelder Pharm.D. 14:55 11/26/15 ([email or web address])    Culture (A)  Final    STAPHYLOCOCCUS SPECIES (COAGULASE NEGATIVE) THE SIGNIFICANCE OF ISOLATING THIS ORGANISM FROM A SINGLE SET OF BLOOD CULTURES WHEN MULTIPLE SETS ARE DRAWN IS UNCERTAIN. PLEASE NOTIFY THE MICROBIOLOGY DEPARTMENT WITHIN ONE WEEK IF SPECIATION AND SENSITIVITIES ARE REQUIRED.    Report Status 11/28/2015 FINAL  Final  Culture, blood (Routine X 2) w Reflex to ID Panel     Status: None (Preliminary result)   Collection Time: 11/25/15 12:28 PM  Result Value Ref Range Status   Specimen Description BLOOD RIGHT HAND  Final   Special Requests IN PEDIATRIC BOTTLE  1CC  Final   Culture NO GROWTH 4 DAYS  Final   Report Status PENDING  Incomplete  Urine culture     Status: None   Collection Time: 11/25/15 12:43 PM  Result Value Ref Range Status   Specimen Description URINE, CATHETERIZED  Final   Special Requests NONE  Final   Culture NO GROWTH  Final   Report Status 11/26/2015 FINAL  Final  Gram stain     Status: None   Collection Time: 11/25/15 12:43 PM  Result Value Ref Range Status   Specimen Description URINE, CATHETERIZED  Final   Special Requests NONE  Final   Gram Stain   Final    WBC PRESENT,BOTH PMN AND MONONUCLEAR GRAM POSITIVE RODS GRAM POSITIVE COCCI IN CLUSTERS GRAM NEGATIVE RODS CYTOSPIN SMEAR    Report Status 11/25/2015  FINAL  Final  MRSA PCR Screening     Status: None   Collection Time: 11/25/15  2:52 PM  Result Value Ref Range Status   MRSA by PCR NEGATIVE NEGATIVE Final    Comment:        The GeneXpert MRSA Assay (FDA approved for NASAL specimens only), is one component of a comprehensive MRSA colonization surveillance program. It is not intended to diagnose MRSA infection nor to guide or monitor treatment for MRSA infections.      Labs: Basic Metabolic Panel:  Recent Labs Lab 11/26/15 0256 11/27/15 0534 11/28/15 0230 11/29/15 0458 11/30/15 0544  NA 143 144 143 143 143  K 5.1 3.9 3.8 4.4 4.3  CL 112* 113* 115* 114* 114*  CO2 _0 19* 18*  GLUCOSE 89 106* 109* 99 109*  BUN _1 21* 29*  CREATININE 0.88 0.91 0.84 1.03* 0.95  CALCIUM 8.4* 8.8* 8.7* 9.2 9.2  MG  --   --   --  1.9  --    Liver Function Tests:  Recent Labs Lab 11/25/15 1110  AST 31  ALT 13*  ALKPHOS 37*  BILITOT 0.9  PROT 7.7  ALBUMIN 3.2*   No results for input(s): LIPASE, AMYLASE in the last 168 hours.  Recent Labs Lab 11/27/15 0534  AMMONIA 37*   CBC:  Recent Labs Lab 11/25/15 1050  11/26/15 0256 11/27/15 0534 11/28/15 0230 11/29/15 0458 11/30/15 0544  WBC 3.5*  --  3.5* 3.2* 3.8* 4.2 3.8*  NEUTROABS 2.0  --   --   --   --   --   --   HGB 10.8*  < > 9.2* 9.0* 8.6* 8.9* 9.3*  HCT 36.2  < > 29.7* 29.1* 28.5* 28.2* 30.5*  MCV 83.2  --  80.7 80.4 80.3 80.1 80.7  PLT 281  --  168 213 212 190 161  < > = values in this interval not displayed. Cardiac Enzymes:  Recent Labs Lab 11/25/15 1517 11/25/15 2005  TROPONINI 0.19* 0.19*   BNP: BNP (last 3 results)  Recent Labs  11/25/15 1110  BNP 1,953.5*    ProBNP (last 3 results) No results for input(s): PROBNP in the last 8760 hours.  CBG:  Recent Labs Lab 11/29/15 0636 11/29/15 1147 11/29/15 1617 11/29/15 2200 11/30/15 0608  GLUCAP 115* 103* 106* 109* 114*       Signed:  Brittanee Ghazarian MD.  [facility] 11/30/2015, 10:52 AM

## 2015-11-30 NOTE — Plan of Care (Signed)
Problem: Coping: Goal: Ability to verbalize positive feelings about self will improve Outcome: Not Applicable Date Met: 67/56/12 Patient non-verbal at this time

## 2015-11-30 NOTE — Care Management Note (Signed)
Case Management Note  Patient Details  Name: Rebecca [name] MRN: [id number] Date of Birth: 07-13-1945  Subjective/Objective:                    Action/Plan: Plan is for patient to discharge back to [facility] today with hospice care. No further needs per CM.   Expected Discharge Date:                  Expected Discharge Plan:  Skilled Nursing Facility  In-House Referral:  Clinical Social Work  Discharge planning Services     Post Acute Care Choice:    Choice offered to:     DME Arranged:    DME Agency:     HH Arranged:    HH Agency:     Status of Service:  In process, will continue to follow  If discussed at Long Length of Stay Meetings, dates discussed:    Additional Comments:  Kermit Balo, RN 11/30/2015, 11:56 AM

## 2015-11-30 NOTE — Progress Notes (Signed)
Notified by central tele about 6 beat run of vtach; MD paged. No orders received at this time.

## 2015-11-30 NOTE — Clinical Social Work Note (Signed)
RN Report Information [facility] via [facility] Report # [phone] Room # 111  Pt is ready for discharge today and will return to [facility]. Pt will have hospice following, and pt's son chose [facility]. CSW updated facility with agency preference. Facility also ready to admit pt as they have received discharge information. Pt's son is aware and agreeable to discharge plan. RN will call report. [facility] will provide transportation. CSW is signing off as no further needs identified.   Dede Query, MSW, LCSW  Clinical Social Worker [phone]

## 2015-11-30 NOTE — Progress Notes (Signed)
Pt being discharged to skilled nursing facility per orders from MD. Pt and family made aware of transfer. RN called and gave report to [place] at facility. Pt's IV was removed before discharge. Pt exited hospital via stretcher.
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
## 2015-12-01 ENCOUNTER — Non-Acute Institutional Stay (SKILLED_NURSING_FACILITY): Payer: Medicare Other | Admitting: Internal Medicine

## 2015-12-01 ENCOUNTER — Encounter (HOSPITAL_COMMUNITY): Payer: Self-pay | Admitting: Neurology

## 2015-12-01 ENCOUNTER — Encounter (HOSPITAL_COMMUNITY): Admission: RE | Admit: 2015-12-01 | Payer: Medicare Other | Source: Ambulatory Visit

## 2015-12-01 ENCOUNTER — Emergency Department (HOSPITAL_COMMUNITY): Payer: Medicare Other

## 2015-12-01 ENCOUNTER — Encounter: Payer: Self-pay | Admitting: Internal Medicine

## 2015-12-01 ENCOUNTER — Inpatient Hospital Stay (HOSPITAL_COMMUNITY)
Admission: EM | Admit: 2015-12-01 | Discharge: 2015-12-08 | DRG: 189 | Disposition: A | Discharge status: Skilled Nursing Facility | Payer: Medicare Other | Attending: Internal Medicine | Admitting: Internal Medicine

## 2015-12-01 DIAGNOSIS — R0902 Hypoxemia: Secondary | ICD-10-CM | POA: Diagnosis not present

## 2015-12-01 DIAGNOSIS — R4182 Altered mental status, unspecified: Secondary | ICD-10-CM

## 2015-12-01 DIAGNOSIS — Z7901 Long term (current) use of anticoagulants: Secondary | ICD-10-CM

## 2015-12-01 DIAGNOSIS — Z515 Encounter for palliative care: Secondary | ICD-10-CM | POA: Diagnosis not present

## 2015-12-01 DIAGNOSIS — E872 Acidosis, unspecified: Secondary | ICD-10-CM | POA: Diagnosis present

## 2015-12-01 DIAGNOSIS — I69390 Apraxia following cerebral infarction: Secondary | ICD-10-CM

## 2015-12-01 DIAGNOSIS — I69391 Dysphagia following cerebral infarction: Secondary | ICD-10-CM

## 2015-12-01 DIAGNOSIS — I69354 Hemiplegia and hemiparesis following cerebral infarction affecting left non-dominant side: Secondary | ICD-10-CM

## 2015-12-01 DIAGNOSIS — J9600 Acute respiratory failure, unspecified whether with hypoxia or hypercapnia: Secondary | ICD-10-CM | POA: Diagnosis not present

## 2015-12-01 DIAGNOSIS — E7521 Fabry (-Anderson) disease: Secondary | ICD-10-CM | POA: Diagnosis present

## 2015-12-01 DIAGNOSIS — R064 Hyperventilation: Secondary | ICD-10-CM | POA: Diagnosis not present

## 2015-12-01 DIAGNOSIS — Z23 Encounter for immunization: Secondary | ICD-10-CM | POA: Diagnosis not present

## 2015-12-01 DIAGNOSIS — J9621 Acute and chronic respiratory failure with hypoxia: Secondary | ICD-10-CM | POA: Diagnosis not present

## 2015-12-01 DIAGNOSIS — Z66 Do not resuscitate: Secondary | ICD-10-CM

## 2015-12-01 DIAGNOSIS — R1310 Dysphagia, unspecified: Secondary | ICD-10-CM | POA: Diagnosis present

## 2015-12-01 DIAGNOSIS — I5043 Acute on chronic combined systolic (congestive) and diastolic (congestive) heart failure: Secondary | ICD-10-CM

## 2015-12-01 DIAGNOSIS — I5042 Chronic combined systolic (congestive) and diastolic (congestive) heart failure: Secondary | ICD-10-CM

## 2015-12-01 DIAGNOSIS — J9601 Acute respiratory failure with hypoxia: Secondary | ICD-10-CM | POA: Diagnosis present

## 2015-12-01 DIAGNOSIS — J189 Pneumonia, unspecified organism: Secondary | ICD-10-CM | POA: Diagnosis present

## 2015-12-01 DIAGNOSIS — G9340 Encephalopathy, unspecified: Secondary | ICD-10-CM | POA: Diagnosis not present

## 2015-12-01 DIAGNOSIS — I480 Paroxysmal atrial fibrillation: Secondary | ICD-10-CM | POA: Diagnosis present

## 2015-12-01 DIAGNOSIS — I63412 Cerebral infarction due to embolism of left middle cerebral artery: Secondary | ICD-10-CM

## 2015-12-01 DIAGNOSIS — F329 Major depressive disorder, single episode, unspecified: Secondary | ICD-10-CM | POA: Diagnosis present

## 2015-12-01 DIAGNOSIS — Z96643 Presence of artificial hip joint, bilateral: Secondary | ICD-10-CM | POA: Diagnosis present

## 2015-12-01 DIAGNOSIS — I110 Hypertensive heart disease with heart failure: Secondary | ICD-10-CM | POA: Diagnosis present

## 2015-12-01 DIAGNOSIS — Z79899 Other long term (current) drug therapy: Secondary | ICD-10-CM

## 2015-12-01 DIAGNOSIS — I6330 Cerebral infarction due to thrombosis of unspecified cerebral artery: Secondary | ICD-10-CM | POA: Diagnosis not present

## 2015-12-01 DIAGNOSIS — G8191 Hemiplegia, unspecified affecting right dominant side: Secondary | ICD-10-CM | POA: Diagnosis not present

## 2015-12-01 DIAGNOSIS — F419 Anxiety disorder, unspecified: Secondary | ICD-10-CM | POA: Diagnosis present

## 2015-12-01 DIAGNOSIS — I69320 Aphasia following cerebral infarction: Secondary | ICD-10-CM

## 2015-12-01 DIAGNOSIS — Z6820 Body mass index (BMI) 20.0-20.9, adult: Secondary | ICD-10-CM

## 2015-12-01 DIAGNOSIS — Y95 Nosocomial condition: Secondary | ICD-10-CM | POA: Diagnosis present

## 2015-12-01 DIAGNOSIS — E119 Type 2 diabetes mellitus without complications: Secondary | ICD-10-CM | POA: Diagnosis present

## 2015-12-01 DIAGNOSIS — E43 Unspecified severe protein-calorie malnutrition: Secondary | ICD-10-CM | POA: Diagnosis present

## 2015-12-01 DIAGNOSIS — R627 Adult failure to thrive: Secondary | ICD-10-CM | POA: Diagnosis present

## 2015-12-01 DIAGNOSIS — I63511 Cerebral infarction due to unspecified occlusion or stenosis of right middle cerebral artery: Secondary | ICD-10-CM | POA: Diagnosis not present

## 2015-12-01 DIAGNOSIS — Z87891 Personal history of nicotine dependence: Secondary | ICD-10-CM

## 2015-12-01 DIAGNOSIS — Z7982 Long term (current) use of aspirin: Secondary | ICD-10-CM

## 2015-12-01 DIAGNOSIS — F0390 Unspecified dementia without behavioral disturbance: Secondary | ICD-10-CM | POA: Diagnosis present

## 2015-12-01 DIAGNOSIS — Z888 Allergy status to other drugs, medicaments and biological substances status: Secondary | ICD-10-CM

## 2015-12-01 LAB — I-STAT CHEM 8, ED
BUN: 52 mg/dL — AB (ref 6–20)
CHLORIDE: 115 mmol/L — AB (ref 101–111)
CREATININE: 1 mg/dL (ref 0.44–1.00)
Calcium, Ion: 1.2 mmol/L (ref 1.15–1.40)
GLUCOSE: 121 mg/dL — AB (ref 65–99)
HCT: 33 % — ABNORMAL LOW (ref 36.0–46.0)
Hemoglobin: 11.2 g/dL — ABNORMAL LOW (ref 12.0–15.0)
POTASSIUM: 4.8 mmol/L (ref 3.5–5.1)
Sodium: 150 mmol/L — ABNORMAL HIGH (ref 135–145)
TCO2: 26 mmol/L (ref 0–100)

## 2015-12-01 LAB — URINALYSIS, ROUTINE W REFLEX MICROSCOPIC
Bilirubin Urine: NEGATIVE
GLUCOSE, UA: NEGATIVE mg/dL
Hgb urine dipstick: NEGATIVE
Ketones, ur: NEGATIVE mg/dL
LEUKOCYTES UA: NEGATIVE
NITRITE: NEGATIVE
PH: 5 (ref 5.0–8.0)
PROTEIN: NEGATIVE mg/dL
Specific Gravity, Urine: 1.007 (ref 1.005–1.030)

## 2015-12-01 LAB — COMPREHENSIVE METABOLIC PANEL
ALBUMIN: 3.5 g/dL (ref 3.5–5.0)
ALK PHOS: 43 U/L (ref 38–126)
ALT: 22 U/L (ref 14–54)
AST: 40 U/L (ref 15–41)
Anion gap: 12 (ref 5–15)
BUN: 35 mg/dL — ABNORMAL HIGH (ref 6–20)
CALCIUM: 9.5 mg/dL (ref 8.9–10.3)
CHLORIDE: 116 mmol/L — AB (ref 101–111)
CO2: 20 mmol/L — AB (ref 22–32)
CREATININE: 0.93 mg/dL (ref 0.44–1.00)
GFR calc Af Amer: >60 mL/min (ref >60)
GFR calc non Af Amer: >60 mL/min (ref >60)
GLUCOSE: 107 mg/dL — AB (ref 65–99)
Potassium: 4.5 mmol/L (ref 3.5–5.1)
SODIUM: 148 mmol/L — AB (ref 135–145)
Total Bilirubin: 1.1 mg/dL (ref 0.3–1.2)
Total Protein: 7.8 g/dL (ref 6.5–8.1)

## 2015-12-01 LAB — CBG MONITORING, ED: GLUCOSE-CAPILLARY: 118 mg/dL — AB (ref 65–99)

## 2015-12-01 LAB — I-STAT VENOUS BLOOD GAS, ED
ACID-BASE DEFICIT: 2 mmol/L (ref 0.0–2.0)
BICARBONATE: 24.6 mmol/L (ref 20.0–28.0)
O2 Saturation: 70 %
PCO2 VEN: 50 mmHg (ref 44.0–60.0)
PO2 VEN: 41 mmHg (ref 32.0–45.0)
TCO2: 26 mmol/L (ref 0–100)
pH, Ven: 7.3 (ref 7.250–7.430)

## 2015-12-01 LAB — BRAIN NATRIURETIC PEPTIDE: B Natriuretic Peptide: >4500 pg/mL — ABNORMAL HIGH (ref 0.0–100.0)

## 2015-12-01 LAB — I-STAT CG4 LACTIC ACID, ED
Lactic Acid, Venous: 3.07 mmol/L (ref 0.5–1.9)
Lactic Acid, Venous: 1.76 mmol/L (ref 0.5–1.9)

## 2015-12-01 LAB — GLUCOSE, CAPILLARY: GLUCOSE-CAPILLARY: 130 mg/dL — AB (ref 65–99)

## 2015-12-01 LAB — I-STAT TROPONIN, ED: Troponin i, poc: 0.24 ng/mL (ref 0.00–0.08)

## 2015-12-01 MED ORDER — FUROSEMIDE 10 MG/ML IJ SOLN: 20.0000 mg | Freq: Once | INTRAMUSCULAR | Status: DC

## 2015-12-01 MED ORDER — NALOXONE HCL 2 MG/2ML IJ SOSY
2.0000 mg | PREFILLED_SYRINGE | Freq: Once | INTRAMUSCULAR | Status: AC
  Administered 2015-12-01: 2 mg via INTRAVENOUS

## 2015-12-01 MED ORDER — ONDANSETRON HCL 4 MG/2ML IJ SOLN
4.0000 mg | Freq: Four times a day (QID) | INTRAMUSCULAR | Status: DC | PRN
Start: 2015-12-01 — End: 2015-12-08

## 2015-12-01 MED ORDER — DEXTROSE 5 % IV SOLN
2.0000 g | INTRAVENOUS | Status: DC
  Administered 2015-12-01 – 2015-12-03 (×3): 2 g via INTRAVENOUS
  Filled 2015-12-01 (×3): qty 2

## 2015-12-01 MED ORDER — MORPHINE SULFATE (PF) 2 MG/ML IV SOLN
2.0000 mg | INTRAVENOUS | Status: DC | PRN
  Administered 2015-12-01: 2 mg via INTRAVENOUS
  Filled 2015-12-01: qty 1

## 2015-12-01 MED ORDER — VANCOMYCIN HCL IN DEXTROSE 1-5 GM/200ML-% IV SOLN
1000.0000 mg | Freq: Once | INTRAVENOUS | Status: AC
  Administered 2015-12-01: 1000 mg via INTRAVENOUS
  Filled 2015-12-01: qty 200

## 2015-12-01 MED ORDER — ONDANSETRON HCL 4 MG PO TABS: 4.0000 mg | ORAL_TABLET | Freq: Four times a day (QID) | ORAL | Status: DC | PRN

## 2015-12-01 MED ORDER — SODIUM CHLORIDE 0.9% FLUSH: 3.0000 mL | INTRAVENOUS | Status: DC | PRN

## 2015-12-01 MED ORDER — HEPARIN SODIUM (PORCINE) 5000 UNIT/ML IJ SOLN
5000.0000 [IU] | Freq: Three times a day (TID) | INTRAMUSCULAR | Status: DC
  Administered 2015-12-02 – 2015-12-08 (×17): 5000 [IU] via SUBCUTANEOUS
  Filled 2015-12-01 (×10): qty 1

## 2015-12-01 MED ORDER — BISACODYL 10 MG RE SUPP: 10.0000 mg | RECTAL | Status: DC | PRN

## 2015-12-01 MED ORDER — ASPIRIN 81 MG PO TBEC: 81.0000 mg | DELAYED_RELEASE_TABLET | Freq: Every day | ORAL | Status: DC

## 2015-12-01 MED ORDER — FUROSEMIDE 10 MG/ML IJ SOLN
40.0000 mg | Freq: Two times a day (BID) | INTRAMUSCULAR | Status: DC
  Administered 2015-12-01 – 2015-12-05 (×8): 40 mg via INTRAVENOUS
  Filled 2015-12-01 (×9): qty 4

## 2015-12-01 MED ORDER — VANCOMYCIN HCL 500 MG IV SOLR
500.0000 mg | Freq: Two times a day (BID) | INTRAVENOUS | Status: DC
  Filled 2015-12-01: qty 500

## 2015-12-01 MED ORDER — FUROSEMIDE 10 MG/ML IJ SOLN
40.0000 mg | Freq: Once | INTRAMUSCULAR | Status: AC
  Administered 2015-12-01: 40 mg via INTRAVENOUS
  Filled 2015-12-01: qty 4

## 2015-12-01 MED ORDER — VANCOMYCIN HCL 500 MG IV SOLR
500.0000 mg | Freq: Two times a day (BID) | INTRAVENOUS | Status: DC
  Administered 2015-12-02 – 2015-12-03 (×3): 500 mg via INTRAVENOUS
  Filled 2015-12-01 (×5): qty 500

## 2015-12-01 MED ORDER — SODIUM CHLORIDE 0.9 % IV SOLN
250.0000 mL | INTRAVENOUS | Status: DC | PRN
  Administered 2015-12-02: 250 mL via INTRAVENOUS

## 2015-12-01 MED ORDER — SODIUM CHLORIDE 0.9% FLUSH
3.0000 mL | Freq: Two times a day (BID) | INTRAVENOUS | Status: DC
  Administered 2015-12-02 – 2015-12-07 (×10): 3 mL via INTRAVENOUS

## 2015-12-01 MED ORDER — ASPIRIN EC 81 MG PO TBEC: 81.0000 mg | DELAYED_RELEASE_TABLET | Freq: Every day | ORAL | Status: DC

## 2015-12-01 NOTE — Progress Notes (Signed)
Pharmacy Antibiotic Note  Rebecca [name] is a 70 y.o. female admitted on 12/01/2015 with AMS and SOB.  Pharmacy has been consulted for vancomycin dosing. Patient discharged yesterday after treatment for HCAP. On vanc during previous admission at 500 mg q12hr from 9/8-9/10. Currently temp low at 96.5, lactate trending down from 3.07 to 1.76, and WBC (from 9/12) low at 3.8.  SCr 0.93 with estimated CrCl of 45 mL/min.   Plan: Vancomycin 1g IV once, then 500 mg IV q12hr Vancomycin goal trough 15-20 mcg/mL Cefepime 2g q24 hr - dose verified by pharmacy  Monitor renal function, clinical status, culture results, and vancomycin trough as needed  Temp (24hrs), Avg:96.5 F (35.8 C), Min:96.5 F (35.8 C), Max:96.5 F (35.8 C)   Recent Labs Lab 11/25/15 1517 11/25/15 1630 11/26/15 0256 11/26/15 0816 11/27/15 0534 11/28/15 0230 11/29/15 0458 11/30/15 0544 12/01/15 1151 12/01/15 1206 12/01/15 1233 12/01/15 1735  WBC  --   --  3.5*  --  3.2* 3.8* 4.2 3.8*  --   --   --   --   CREATININE  --   --  0.88  --  0.91 0.84 1.03* 0.95 1.00  --  0.93  --   LATICACIDVEN 2.8* 2.7*  --  1.5  --   --   --   --   --  3.07*  --  1.76    Estimated Creatinine Clearance: 47.9 mL/min (by C-G formula based on SCr of 0.93 mg/dL).    Allergies  Allergen Reactions  . Lisinopril Swelling    Antimicrobials this admission: 9/12 Cefepime >>  9/12 Vanc >>   Dose adjustments this admission: N/A  Microbiology results: pending   Thank you for allowing pharmacy to be a part of this patient's care.  York Cerise, PharmD Pharmacy Resident  Pager [phone] 12/01/15 7:10 PM

## 2015-12-01 NOTE — ED Notes (Signed)
Followed up with Dr. Konrad Dolores regarding admission order, order being placed now

## 2015-12-01 NOTE — ED Notes (Signed)
RN attempt to get labs x 2, unsuccessful.

## 2015-12-01 NOTE — Progress Notes (Signed)
Rebecca [name] and I examined Rebecca [name] and attempted to reach her [name].  We just met with him yesterday in PMT consultation.    Rebecca [name] is apneic and appears as though she may slip away soon.    I will add low dose morphine PRN for respiratory distress.  Imogene Burn, [place] Palliative Medicine Pager: [phone]

## 2015-12-01 NOTE — ED Notes (Signed)
Admitting MD at bedside.

## 2015-12-01 NOTE — ED Notes (Signed)
Dr. Konrad Dolores aware of pt's output post foley and heavily soaked brief pre foley

## 2015-12-01 NOTE — ED Notes (Addendum)
Spoke with Dr. Konrad Dolores. Reports pt is DNR.

## 2015-12-01 NOTE — ED Notes (Signed)
Dr. Verdie Mosher at bedside on the phone talking with patient's son about code status and family wishes. Pt is on NRB

## 2015-12-01 NOTE — ED Notes (Signed)
Portable XR at beside

## 2015-12-01 NOTE — ED Triage Notes (Addendum)
Per ems- Pt comes from [facility] was d/c from hospital yesterday for CVA, today had 80% oxygen sats RA this morning, placed on Kaser and maintained 90's. However, she became apneic when EMS arrived agonal breathing and started to assist ventilations. BP 160/100, CBG 151, HR 62. She is hospice for CHF.

## 2015-12-01 NOTE — H&P (Signed)
History and Physical    Rebecca [name]:[id number] DOB: 1945-11-13 DOA: 12/01/2015  PCP: Merrilee Seashore, MD Patient coming from: SNF  Chief Complaint: ams, sob  HPI: Rebecca [name] is a 70 y.o. 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 Level V caveat applies as patient is unable to provide any reliable history at this time. History provided by SNF staff, EDP, and EMS. At baseline patient is only minimally verbal and occasionally obeys commands. Of note patient was discharged from [facility] on 11/30/2015 after 5 day admission for stroke with subsequent development of HC AP. She was noted to have difficulty breathing at the SNF. EMS was called patient was noted to have an O2 saturation of approximately 80%. She was placed on nasal cannula with improvement in her effort and O2 saturations. Patient was only minimally responsive during this period of time. History is able to be obtained. No other focal complaints noted by SNF staff    ED Course: Objective findings outlined below. Patient noted be fluid overloaded given 40 mg of IV Lasix. Patient's O2 was weaned to nasal cannula after respiratory effort and O2 desaturations improved.  Review of Systems: As per HPI otherwise 10 point review of systems negative.   Ambulatory Status: bed bound   Past Medical History:  Diagnosis Date  . Atrial fibrillation (HCC) 05/01/2013  . Depression   . Diabetes mellitus without complication (HCC)   . Expressive aphasia 03/23/2013  . 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 Fabry's disease (HCC)   . Hypertension   . Stroke (HCC)    2015  . Stroke [facility] 2016   aphasia  . Tobacco abuse 05/01/2013    Past Surgical History:  Procedure Laterality Date  . CESAREAN SECTION     x3  . hip replacement Right 1990's  . hip replacemet Left 1990's  . LAPAROSCOPIC GASTROSTOMY  N/A 04/20/2014   Procedure: LAPAROSCOPIC GASTROSTOMY TUBE PLACEMENT;  Surgeon: Axel Filler, MD;  Location: [facility] OR;  Service: General;  Laterality: N/A;  . PEG tube placemnt  04/15/14  . TEE WITHOUT CARDIOVERSION N/A 03/26/2013   Procedure: TRANSESOPHAGEAL ECHOCARDIOGRAM (TEE);  Surgeon: Thurmon Fair, MD;  Location: [facility] ENDOSCOPY;  Service: Cardiovascular;  Laterality: N/A;  . TOOTH EXTRACTION Right 1/17   lower canine    Social History   Social History  . Marital status: Single    Spouse name: N/A  . Number of children: N/A  . Years of education: N/A   Occupational History  . Not on file.   Social History Main Topics  . Smoking status: Never Smoker  . Smokeless tobacco: Never Used  . Alcohol use No  . Drug use: No  . Sexual activity: Not Currently   Other Topics Concern  . Not on file   Social History Narrative  . No narrative on file    Allergies  Allergen Reactions  . Lisinopril Swelling    Family History  Problem Relation Age of Onset  . Alcohol abuse Maternal Aunt     Prior to Admission medications   Medication Sig Start Date End Date Taking? Authorizing Provider  acetaminophen (TYLENOL) 325 MG tablet Take 2 tablets (650 mg total) by mouth every 6 (six) hours as needed for mild pain (or Fever >/= 101). 11/30/15  Yes Rodolph Bong, MD  acetaminophen (TYLENOL) 650 MG suppository Place 1 suppository (650 mg total)  rectally every 6 (six) hours as needed for mild pain (or Fever >/= 101). 11/30/15  Yes Rodolph Bonganiel V Thompson, MD  apixaban (ELIQUIS) 5 MG TABS tablet Take 5 mg by mouth 2 (two) times daily.    Yes Historical Provider, MD  aspirin EC 81 MG EC tablet Take 1 tablet (81 mg total) by mouth daily. 11/30/15  Yes Rodolph Bonganiel V Thompson, MD  bisacodyl (DULCOLAX) 10 MG suppository Place 1 suppository (10 mg total) rectally as needed for moderate constipation. 11/30/15  Yes Rodolph Bonganiel V Thompson, MD  LORazepam (ATIVAN) 2 MG/ML concentrated solution Take 0.5 mLs (1 mg total) by  mouth every 8 (eight) hours as needed for anxiety, sedation or sleep (nausea). 11/30/15  Yes Rodolph Bonganiel V Thompson, MD  metoprolol tartrate (LOPRESSOR) 25 MG tablet Take 0.5 tablets (12.5 mg total) by mouth 2 (two) times daily. 04/07/14  Yes Drema Dallasurtis J Woods, MD  mirtazapine (REMERON) 7.5 MG tablet Take 7.5 mg by mouth at bedtime.   Yes Historical Provider, MD  Morphine Sulfate (MORPHINE CONCENTRATE) 10 MG/0.5ML SOLN concentrated solution Place 0.25 mLs (5 mg total) under the tongue every 2 (two) hours as needed for moderate pain or shortness of breath. 11/30/15  Yes Rodolph Bonganiel V Thompson, MD  sertraline (ZOLOFT) 50 MG tablet Take 75 mg by mouth daily.    Yes Historical Provider, MD  Skin Protectants, Misc. (CALAZIME SKIN PROTECTANT EX) Apply 1 application topically 2 (two) times daily. TO BUTTOCKS   Yes Historical Provider, MD    Physical Exam: Vitals:   12/01/15 1600 12/01/15 1630 12/01/15 1700 12/01/15 1800  BP: 142/96 140/88 139/95 142/79  Pulse: (!) 57 (!) 49  (!) 46  Resp: (!) 8 20 22 17   SpO2: 100% 100%  100%     General: Frail and cachectic appearing, lying in bed Eyes:  PERRL BUT SLUGGISH , nml lids, ENT: Dry mucous membranes, poor dentition Neck:  no LAD, masses or thyromegaly Cardiovascular: 3/6 systolic murmur, 1+ bilateral lotion any pitting edema, Respiratory: Coarse breath sounds throughout, intermittent agonal breathing Abdomen:  soft, ntnd, NABS Skin:  no rash or induration seen on limited exam Musculoskeletal: Patient is obtunded, Imodium normality is appreciated. Will withdraw to painful stimuli  Psychiatric: Withdrawal to painful stimuli, does not follow commands, obtunded Neurologic: Difficult to fully appreciate due to patient's current obtunded state.  Labs on Admission: I have personally reviewed following labs and imaging studies  CBC:  Recent Labs Lab 11/25/15 1050  11/26/15 0256 11/27/15 0534 11/28/15 0230 11/29/15 0458 11/30/15 0544 12/01/15 1151  WBC 3.5*   --  3.5* 3.2* 3.8* 4.2 3.8*  --   NEUTROABS 2.0  --   --   --   --   --   --   --   HGB 10.8*  < > 9.2* 9.0* 8.6* 8.9* 9.3* 11.2*  HCT 36.2  < > 29.7* 29.1* 28.5* 28.2* 30.5* 33.0*  MCV 83.2  --  80.7 80.4 80.3 80.1 80.7  --   PLT 281  --  168 213 212 190 161  --   < > = values in this interval not displayed. Basic Metabolic Panel:  Recent Labs Lab 11/27/15 0534 11/28/15 0230 11/29/15 0458 11/30/15 0544 12/01/15 1151 12/01/15 1233  NA 144 143 143 143 150* 148*  K 3.9 3.8 4.4 4.3 4.8 4.5  CL 113* 115* 114* 114* 115* 116*  CO2 23 22 19* 18*  --  20*  GLUCOSE 106* 109* 99 109* 121* 107*  BUN 15 13 21*  29* 52* 35*  CREATININE 0.91 0.84 1.03* 0.95 1.00 0.93  CALCIUM 8.8* 8.7* 9.2 9.2  --  9.5  MG  --   --  1.9  --   --   --    GFR: Estimated Creatinine Clearance: 47.9 mL/min (by C-G formula based on SCr of 0.93 mg/dL). Liver Function Tests:  Recent Labs Lab 11/25/15 1110 12/01/15 1233  AST 31 40  ALT 13* 22  ALKPHOS 37* 43  BILITOT 0.9 1.1  PROT 7.7 7.8  ALBUMIN 3.2* 3.5   No results for input(s): LIPASE, AMYLASE in the last 168 hours.  Recent Labs Lab 11/27/15 0534  AMMONIA 37*   Coagulation Profile:  Recent Labs Lab 11/25/15 1110  INR 1.64   Cardiac Enzymes:  Recent Labs Lab 11/25/15 1517 11/25/15 2005  TROPONINI 0.19* 0.19*   BNP (last 3 results) No results for input(s): PROBNP in the last 8760 hours. HbA1C: No results for input(s): HGBA1C in the last 72 hours. CBG:  Recent Labs Lab 11/29/15 1617 11/29/15 2200 11/30/15 0608 11/30/15 1128 12/01/15 1142  GLUCAP 106* 109* 114* 113* 118*   Lipid Profile: No results for input(s): CHOL, HDL, LDLCALC, TRIG, CHOLHDL, LDLDIRECT in the last 72 hours. Thyroid Function Tests: No results for input(s): TSH, T4TOTAL, FREET4, T3FREE, THYROIDAB in the last 72 hours. Anemia Panel: No results for input(s): VITAMINB12, FOLATE, FERRITIN, TIBC, IRON, RETICCTPCT in the last 72 hours. Urine analysis:      Component Value Date/Time   COLORURINE YELLOW 12/01/2015 1715   APPEARANCEUR CLOUDY (A) 12/01/2015 1715   LABSPEC 1.007 12/01/2015 1715   PHURINE 5.0 12/01/2015 1715   GLUCOSEU NEGATIVE 12/01/2015 1715   HGBUR NEGATIVE 12/01/2015 1715   BILIRUBINUR NEGATIVE 12/01/2015 1715   KETONESUR NEGATIVE 12/01/2015 1715   PROTEINUR NEGATIVE 12/01/2015 1715   UROBILINOGEN 1.0 04/12/2014 0102   NITRITE NEGATIVE 12/01/2015 1715   LEUKOCYTESUR NEGATIVE 12/01/2015 1715    Creatinine Clearance: Estimated Creatinine Clearance: 47.9 mL/min (by C-G formula based on SCr of 0.93 mg/dL).  Sepsis Labs: @LABRCNTIP (procalcitonin:4,lacticidven:4) ) Recent Results (from the past 240 hour(s))  Culture, blood (Routine X 2) w Reflex to ID Panel     Status: Abnormal   Collection Time: 11/25/15 12:05 PM  Result Value Ref Range Status   Specimen Description BLOOD RIGHT ANTECUBITAL  Final   Special Requests IN PEDIATRIC BOTTLE  1CC  Final   Culture  Setup Time   Final    GRAM POSITIVE COCCI IN CLUSTERS IN PEDIATRIC BOTTLE CRITICAL RESULT CALLED TO, READ BACK BY AND VERIFIED WITH: N. Batchelder Pharm.D. 14:55 11/26/15 ([email or web address])    Culture (A)  Final    STAPHYLOCOCCUS SPECIES (COAGULASE NEGATIVE) THE SIGNIFICANCE OF ISOLATING THIS ORGANISM FROM A SINGLE SET OF BLOOD CULTURES WHEN MULTIPLE SETS ARE DRAWN IS UNCERTAIN. PLEASE NOTIFY THE MICROBIOLOGY DEPARTMENT WITHIN ONE WEEK IF SPECIATION AND SENSITIVITIES ARE REQUIRED.    Report Status 11/28/2015 FINAL  Final  Culture, blood (Routine X 2) w Reflex to ID Panel     Status: None   Collection Time: 11/25/15 12:28 PM  Result Value Ref Range Status   Specimen Description BLOOD RIGHT HAND  Final   Special Requests IN PEDIATRIC BOTTLE  1CC  Final   Culture NO GROWTH 5 DAYS  Final   Report Status 11/30/2015 FINAL  Final  Urine culture     Status: None   Collection Time: 11/25/15 12:43 PM  Result Value Ref Range Status   Specimen Description URINE, CATHETERIZED   Final  Special Requests NONE  Final   Culture NO GROWTH  Final   Report Status 11/26/2015 FINAL  Final  Gram stain     Status: None   Collection Time: 11/25/15 12:43 PM  Result Value Ref Range Status   Specimen Description URINE, CATHETERIZED  Final   Special Requests NONE  Final   Gram Stain   Final    WBC PRESENT,BOTH PMN AND MONONUCLEAR GRAM POSITIVE RODS GRAM POSITIVE COCCI IN CLUSTERS GRAM NEGATIVE RODS CYTOSPIN SMEAR    Report Status 11/25/2015 FINAL  Final  MRSA PCR Screening     Status: None   Collection Time: 11/25/15  2:52 PM  Result Value Ref Range Status   MRSA by PCR NEGATIVE NEGATIVE Final    Comment:        The GeneXpert MRSA Assay (FDA approved for NASAL specimens only), is one component of a comprehensive MRSA colonization surveillance program. It is not intended to diagnose MRSA infection nor to guide or monitor treatment for MRSA infections.      Radiological Exams on Admission: Ct Head Wo Contrast  Result Date: 12/01/2015 CLINICAL DATA:  Altered mental status. EXAM: CT HEAD WITHOUT CONTRAST TECHNIQUE: Contiguous axial images were obtained from the base of the skull through the vertex without intravenous contrast. COMPARISON:  CT brain 11/27/2015 FINDINGS: Brain: No evidence of acute infarction, hemorrhage, extra-axial collection, ventriculomegaly, or mass effect. Old left frontal lobe infarct with encephalomalacia. Old right posterior parietal lobe infarct. Generalized cerebral atrophy. Periventricular white matter low attenuation likely secondary to microangiopathy. Vascular: Cerebrovascular atherosclerotic calcifications are noted. Skull: Negative for fracture or focal lesion. Sinuses/Orbits: Visualized portions of the orbits are unremarkable. Visualized portions of the paranasal sinuses and mastoid air cells are unremarkable. Other: None. IMPRESSION: 1. No acute intracranial pathology. Electronically Signed   By: Elige Ko   On: 12/01/2015 12:51   Dg  Chest Port 1 View  Result Date: 12/01/2015 CLINICAL DATA:  Acute respiratory failure.  Atrial fibrillation. EXAM: PORTABLE CHEST 1 VIEW COMPARISON:  12/01/2015 FINDINGS: Stable moderate cardiomegaly.  Aortic atherosclerosis. Mild airspace disease is again seen in the lateral right lung base. No evidence of pulmonary edema or pleural effusion. IMPRESSION: No significant change in mild right basilar airspace opacity, suspicious for pneumonia. Stable cardiomegaly.  No evidence of congestive heart failure. Aortic atherosclerosis. Electronically Signed   By: Myles Rosenthal M.D.   On: 12/01/2015 17:59   Dg Chest Portable 1 View  Result Date: 12/01/2015 CLINICAL DATA:  Altered mental status, low oxygen saturation, recent stroke and hospitalization, history diabetes mellitus, hypertension, atrial fibrillation, smoking EXAM: PORTABLE CHEST 1 VIEW COMPARISON:  Portable exam 1258 hours compared to 11/25/2015 FINDINGS: Enlargement of cardiac silhouette with pulmonary vascular congestion. Tortuous thoracic aorta with atherosclerotic calcification. Bibasilar subsegmental atelectasis greater on RIGHT. Lungs otherwise clear. No pleural effusion or pneumothorax. Probable BILATERAL chronic rotator cuff tears. IMPRESSION: Enlargement of cardiac silhouette with pulmonary vascular congestion. Bibasilar subsegmental atelectasis greater on RIGHT. Electronically Signed   By: Ulyses Southward M.D.   On: 12/01/2015 13:17      Assessment/Plan Active Problems:   Acute encephalopathy   HCAP (healthcare-associated pneumonia)   End of life care   Acute on chronic respiratory failure (HCC)   Lactic acidemia   Social: Of note at last admission patient was started into the hospice program. However due to the short time that she was discharged from the hospital the services were unable to be realized to the full spectrum. Both the DP and myself  have had lengthy discussions with patient's son who is healthcare power of attorney regarding  goals of care. His desire is to make his mother comfortable at this time and does not want anything "heroic". We've agreed that there be no significant escalation in her therapy notes prolong her life such as BiPAP or intubation. Patient's son is to call his sister who has expressed desires to have his mother kept alive until she can get down from [place]. Very clear discussions have been had with the son with regards to this being an unethical decision with regards to the pain and suffering his mother is going through. He agrees with this assessment and will talk to his sister about that tonight. Patient's son has stated that he wants his mother to be kept comfortable but will not transition to full comfort care at this time as there is a chance that her respiratory failure may be reversed with aggressive diuresis. Palliative care has been made aware and we greatly appreciate their assistance in this case. Best case scenario for this patient to be to be made full comfort care and transitioned either to an inpatient hospice center or she is unstable to be kept here at [facility] until she passes. Patient's son aware that she may not survive the night. - No AM labs - minimal vital sign checks  Respiratory failure with hypoxemia: O2 saturation of 80% noted at SNF. Currently with agonal breathing intermittently on 2 L nasal cannula, CXR concerning for CHF exacerbation with the NP greater than 4500. VBG showing pH 7.3, PCO2 50, PO2 41, bicarbonate 26. Repeat chest x-ray shows interval improvement in pulmonary vascular congestion by my read after dose of IV Lasix. CXR shows persistance of HCAP. Of note patient's anabiotic were stopped at time of discharge during last hospitalization as it was presumed that patient will go on hospice care. As this is not yet at been initiated I will resume antibiotics until final decisions are made and patient is able to be transferred to a hospice center. Lactic acid has  improved as patient's respiratory status is improved with decline from 3-1.7. - Vanc/Cefepime - Lasix IV 40 BID - [place] O2   Acute encephalopathy: Patient with abnormal baseline communicative abilities. 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 UA negative. No significant metabolic derangements. No reversal of AMS w/ Narcan. - Treatment plan as above  Anxiety: - continue Ativan for anxiety  HTN: - Hydralazine PRN  Other home medications not able to be ordered at this time as pt not taking PO and moving to full hospice in the AM.   DVT prophylaxis: hep  Code Status: DNR  Family Communication: Son - HCPOA  Disposition Plan: pending hospice placement or passing in hospital  Consults called: Palliative  Admission status: obs    Areg Bialas J MD [facility]  If 7PM-7AM, please contact night-coverage [email or web address] Password [facility]  12/01/2015, 6:21 PM

## 2015-12-01 NOTE — ED Notes (Signed)
Attempted to call report. Floor RN unable to accept report.  

## 2015-12-01 NOTE — ED Notes (Signed)
Per Dr. Margot Ables: place foley then call to report output

## 2015-12-01 NOTE — ED Provider Notes (Signed)
[facility]-EMERGENCY DEPT Provider Note   CSN: [id number] Arrival date & time: 12/01/15  1132     History   Chief Complaint Chief Complaint  Patient presents with  . Respiratory Distress    HPI Rebecca [name] is a 70 y.o. female.  HPI Level 5 caveat due to patient being non-verbal.  70 year old female who presents with respiratory distress. 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 She is on L [place]. Was recently discharged from the hospital yesterday after management of acute encephalopathy due to hospital-acquired pneumonia and acute CVA. History obtained from EMS states that nursing home was concerned that patient was hypoxic to 80% earlier today. She was placed on nasal cannula with improvement in her hypoxia. Later on in the day she was noted to be abnormally breathing, and EMS was called. Bag valve mask ventilation was performed and she was brought to the ED for evaluation.   Past Medical History:  Diagnosis Date  . Atrial fibrillation (HCC) 05/01/2013  . Depression   . Diabetes mellitus without complication (HCC)   . Expressive aphasia 03/23/2013  . 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 Fabry's disease (HCC)   . Hypertension   . Stroke (HCC)    2015  . Stroke [facility](HCC) 2016   aphasia  . Tobacco abuse 05/01/2013    Patient Active Problem List   Diagnosis Date Noted  . FTT (failure to thrive) in adult 11/30/2015  . 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 Anorexia symptom   . Palliative care encounter   . Encounter for hospice care discussion   . Cerebral infarction due to embolism of left middle cerebral artery (HCC)   . NSTEMI (non-ST elevated myocardial infarction) (HCC)   . Cerebral embolism with cerebral infarction 11/27/2015  . Acute encephalopathy 11/25/2015  . Hyperkalemia 11/25/2015  . HCAP  (healthcare-associated pneumonia) 11/25/2015  . Community acquired pneumonia 11/25/2015  . 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 Systolic and diastolic CHF, chronic (HCC) 07/26/2015  . Anemia, chronic disease 07/23/2015  . Hematochezia 07/23/2015  . Lower GI bleed 07/18/2015  . Late effects of CVA (cerebrovascular accident) 07/06/2015  . UTI (urinary tract infection) 04/18/2015  . E. coli UTI 04/05/2015  . 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 DNR (do not resuscitate) discussion   . Dysphagia S/P CVA (cerebrovascular accident) 04/22/2014  . 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 Global aphasia   . Paroxysmal atrial fibrillation (HCC)   . Hyperlipidemia   . Pulmonary hypertension (HCC)   . Elevated troponin 04/01/2014    Past Surgical History:  Procedure Laterality Date  . CESAREAN SECTION     x3  . hip replacement Right 1990's  . hip replacemet Left 1990's  . LAPAROSCOPIC GASTROSTOMY N/A 04/20/2014   Procedure: LAPAROSCOPIC GASTROSTOMY TUBE PLACEMENT;  Surgeon: Axel FillerArmando Ramirez, MD;  Location: [facility] OR;  Service: General;  Laterality: N/A;  . PEG tube placemnt  04/15/14  . TEE WITHOUT CARDIOVERSION N/A 03/26/2013   Procedure: TRANSESOPHAGEAL ECHOCARDIOGRAM (TEE);  Surgeon: Thurmon FairMihai Croitoru, MD;  [facility];  Service: Cardiovascular;  Laterality: N/A;  . TOOTH EXTRACTION Right 1/17   lower canine    OB History    No data available       Home Medications  Prior to Admission medications   Medication Sig Start Date End Date Taking? Authorizing Provider  acetaminophen (TYLENOL) 325 MG tablet Take 2 tablets (650 mg total) by mouth every 6 (six) hours as needed for mild pain (or Fever >/= 101). 11/30/15  Yes Rodolph Bong, MD  acetaminophen (TYLENOL) 650 MG suppository Place 1 suppository (650 mg total) rectally every 6 (six) hours as needed  for mild pain (or Fever >/= 101). 11/30/15  Yes Rodolph Bong, MD  apixaban (ELIQUIS) 5 MG TABS tablet Take 5 mg by mouth 2 (two) times daily.    Yes Historical Provider, MD  aspirin EC 81 MG EC tablet Take 1 tablet (81 mg total) by mouth daily. 11/30/15  Yes Rodolph Bong, MD  bisacodyl (DULCOLAX) 10 MG suppository Place 1 suppository (10 mg total) rectally as needed for moderate constipation. 11/30/15  Yes Rodolph Bong, MD  LORazepam (ATIVAN) 2 MG/ML concentrated solution Take 0.5 mLs (1 mg total) by mouth every 8 (eight) hours as needed for anxiety, sedation or sleep (nausea). 11/30/15  Yes Rodolph Bong, MD  metoprolol tartrate (LOPRESSOR) 25 MG tablet Take 0.5 tablets (12.5 mg total) by mouth 2 (two) times daily. 04/07/14  Yes Drema Dallas, MD  mirtazapine (REMERON) 7.5 MG tablet Take 7.5 mg by mouth at bedtime.   Yes Historical Provider, MD  Morphine Sulfate (MORPHINE CONCENTRATE) 10 MG/0.5ML SOLN concentrated solution Place 0.25 mLs (5 mg total) under the tongue every 2 (two) hours as needed for moderate pain or shortness of breath. 11/30/15  Yes Rodolph Bong, MD  sertraline (ZOLOFT) 50 MG tablet Take 75 mg by mouth daily.    Yes Historical Provider, MD  Skin Protectants, Misc. (CALAZIME SKIN PROTECTANT EX) Apply 1 application topically 2 (two) times daily. TO BUTTOCKS   Yes Historical Provider, MD    Family History Family History  Problem Relation Age of Onset  . Alcohol abuse Maternal Aunt     Social History Social History  Substance Use Topics  . Smoking status: Never Smoker  . Smokeless tobacco: Never Used  . Alcohol use No     Allergies   Lisinopril   Review of Systems Review of Systems Unable to be obtained: non-verbal  Physical Exam Updated Vital Signs BP 134/97   Pulse (!) 57   Resp 22   SpO2 100%   Physical Exam   ED Treatments / Results  Labs (all labs ordered are listed, but only abnormal results are displayed) Labs Reviewed    COMPREHENSIVE METABOLIC PANEL - Abnormal; Notable for the following:       Result Value   Sodium 148 (*)    Chloride 116 (*)    CO2 20 (*)    Glucose, Bld 107 (*)    BUN 35 (*)    All other components within normal limits  BRAIN NATRIURETIC PEPTIDE - Abnormal; Notable for the following:    B Natriuretic Peptide >4,500.0 (*)    All other components within normal limits  CBG MONITORING, ED - Abnormal; Notable for the following:    Glucose-Capillary 118 (*)    All other components within normal limits  I-STAT CHEM 8, ED - Abnormal; Notable for the following:    Sodium 150 (*)    Chloride 115 (*)    BUN 52 (*)    Glucose, Bld 121 (*)    Hemoglobin 11.2 (*)    HCT 33.0 (*)    All other components within normal limits  I-STAT  TROPOININ, ED - Abnormal; Notable for the following:    Troponin i, poc 0.24 (*)    All other components within normal limits  I-STAT CG4 LACTIC ACID, ED - Abnormal; Notable for the following:    Lactic Acid, Venous 3.07 (*)    All other components within normal limits  CBC WITH DIFFERENTIAL/PLATELET  URINALYSIS, ROUTINE W REFLEX MICROSCOPIC (NOT AT [facility])  CBG MONITORING, ED  I-STAT VENOUS BLOOD GAS, ED  I-STAT CG4 LACTIC ACID, ED    EKG  EKG Interpretation  Date/Time:  Wednesday December 01 2015 11:38:47 EDT Ventricular Rate:  66 PR Interval:    QRS Duration: 108 QT Interval:  441 QTC Calculation: 463 R Axis:   -59 Text Interpretation:  Sinus rhythm Left anterior fascicular block LVH with secondary repolarization abnormality Anterior Q waves, possibly due to LVH Similar to prior EKG  Confirmed by Daimen Shovlin MD, Traveion Ruddock [phone]) on 12/01/2015 11:55:30 AM       Radiology Ct Head Wo Contrast  Result Date: 12/01/2015 CLINICAL DATA:  Altered mental status. EXAM: CT HEAD WITHOUT CONTRAST TECHNIQUE: Contiguous axial images were obtained from the base of the skull through the vertex without intravenous contrast. COMPARISON:  CT brain 11/27/2015 FINDINGS: Brain: No  evidence of acute infarction, hemorrhage, extra-axial collection, ventriculomegaly, or mass effect. Old left frontal lobe infarct with encephalomalacia. Old right posterior parietal lobe infarct. Generalized cerebral atrophy. Periventricular white matter low attenuation likely secondary to microangiopathy. Vascular: Cerebrovascular atherosclerotic calcifications are noted. Skull: Negative for fracture or focal lesion. Sinuses/Orbits: Visualized portions of the orbits are unremarkable. Visualized portions of the paranasal sinuses and mastoid air cells are unremarkable. Other: None. IMPRESSION: 1. No acute intracranial pathology. Electronically Signed   By: Elige Ko   On: 12/01/2015 12:51   Dg Chest Portable 1 View  Result Date: 12/01/2015 CLINICAL DATA:  Altered mental status, low oxygen saturation, recent stroke and hospitalization, history diabetes mellitus, hypertension, atrial fibrillation, smoking EXAM: PORTABLE CHEST 1 VIEW COMPARISON:  Portable exam 1258 hours compared to 11/25/2015 FINDINGS: Enlargement of cardiac silhouette with pulmonary vascular congestion. Tortuous thoracic aorta with atherosclerotic calcification. Bibasilar subsegmental atelectasis greater on RIGHT. Lungs otherwise clear. No pleural effusion or pneumothorax. Probable BILATERAL chronic rotator cuff tears. IMPRESSION: Enlargement of cardiac silhouette with pulmonary vascular congestion. Bibasilar subsegmental atelectasis greater on RIGHT. Electronically Signed   By: Ulyses Southward M.D.   On: 12/01/2015 13:17    Procedures Procedures (including critical care time)   CRITICAL CARE Performed by: Lavera Guise   Total critical care time: 50 minutes  Critical care time was exclusive of separately billable procedures and treating other patients.  Critical care was necessary to treat or prevent imminent or life-threatening deterioration.  Critical care was time spent personally by me on the following activities: development  of treatment plan with patient and/or surrogate as well as nursing, discussions with consultants, evaluation of patient's response to treatment, examination of patient, obtaining history from patient or surrogate, ordering and performing treatments and interventions, ordering and review of laboratory studies, ordering and review of radiographic studies, pulse oximetry and re-evaluation of patient's condition.  Medications Ordered in ED Medications  naloxone (NARCAN) injection 2 mg (2 mg Intravenous Given 12/01/15 1142)  furosemide (LASIX) injection 40 mg (40 mg Intravenous Given 12/01/15 1408)     Initial Impression / Assessment and Plan / ED Course  I have reviewed the triage vital signs and the nursing notes.  Pertinent labs & imaging results that were available during my care  of the patient were reviewed by me and considered in my medical decision making (see chart for details).  Patient appearing encephalopathic on arrival, with GCS 6. On chart review, this is close to her baseline though. Occasionally becomes apneic requiring tactile stimulation, but ultimately weaned to nasal cannula. Narcan given prior to this, but no significant change in mental status.   Elevated lactate of 3.07. Feels this may have been response to respiratory failure earlier. Currently not suspicious for sepsis although UA pending. CXR w/o PNA but evidence of pulmonary edema. BNP > 4500 suggestive of CHF exacerbation. Given lasix for diuresis.  Discussed goals of care with son. This was also done by Dr. Margot Ables from hospitalist service. She is DNR and hospice at this time. Admitted for observation.    Final Clinical Impressions(s) / ED Diagnoses   Final diagnoses:  Acute respiratory failure with hypoxia (HCC)  Acute encephalopathy  Acute on chronic combined systolic and diastolic congestive heart failure [facility])    New Prescriptions New Prescriptions   No medications on file     Lavera Guise, MD 12/01/15  1639

## 2015-12-01 NOTE — ED Notes (Signed)
Switched to 4 L  per Dr. Verdie Mosher.

## 2015-12-01 NOTE — ED Notes (Signed)
Pt's son at bedside. Dr. Verdie Mosher at bedside.

## 2015-12-01 NOTE — ED Notes (Signed)
Prior to inserting foley pt began urinating. UA collected via clean catch, foley then inserted after. Peri-care completed prior to foley insertion.

## 2015-12-01 NOTE — ED Notes (Signed)
Phlebotomy at bedside trying to get labs.

## 2015-12-02 ENCOUNTER — Other Ambulatory Visit: Payer: Self-pay

## 2015-12-02 DIAGNOSIS — J9621 Acute and chronic respiratory failure with hypoxia: Secondary | ICD-10-CM | POA: Diagnosis not present

## 2015-12-02 DIAGNOSIS — E43 Unspecified severe protein-calorie malnutrition: Secondary | ICD-10-CM | POA: Diagnosis present

## 2015-12-02 DIAGNOSIS — Z515 Encounter for palliative care: Secondary | ICD-10-CM | POA: Diagnosis not present

## 2015-12-02 DIAGNOSIS — R0600 Dyspnea, unspecified: Secondary | ICD-10-CM

## 2015-12-02 DIAGNOSIS — R627 Adult failure to thrive: Secondary | ICD-10-CM | POA: Diagnosis present

## 2015-12-02 DIAGNOSIS — I69320 Aphasia following cerebral infarction: Secondary | ICD-10-CM | POA: Diagnosis not present

## 2015-12-02 DIAGNOSIS — I69390 Apraxia following cerebral infarction: Secondary | ICD-10-CM | POA: Diagnosis not present

## 2015-12-02 DIAGNOSIS — Z66 Do not resuscitate: Secondary | ICD-10-CM | POA: Diagnosis not present

## 2015-12-02 DIAGNOSIS — E872 Acidosis: Secondary | ICD-10-CM | POA: Diagnosis present

## 2015-12-02 DIAGNOSIS — I69354 Hemiplegia and hemiparesis following cerebral infarction affecting left non-dominant side: Secondary | ICD-10-CM | POA: Diagnosis not present

## 2015-12-02 DIAGNOSIS — E119 Type 2 diabetes mellitus without complications: Secondary | ICD-10-CM | POA: Diagnosis present

## 2015-12-02 DIAGNOSIS — I69391 Dysphagia following cerebral infarction: Secondary | ICD-10-CM | POA: Diagnosis not present

## 2015-12-02 DIAGNOSIS — I110 Hypertensive heart disease with heart failure: Secondary | ICD-10-CM | POA: Diagnosis present

## 2015-12-02 DIAGNOSIS — I63511 Cerebral infarction due to unspecified occlusion or stenosis of right middle cerebral artery: Secondary | ICD-10-CM | POA: Diagnosis not present

## 2015-12-02 DIAGNOSIS — Z7901 Long term (current) use of anticoagulants: Secondary | ICD-10-CM | POA: Diagnosis not present

## 2015-12-02 DIAGNOSIS — Z96643 Presence of artificial hip joint, bilateral: Secondary | ICD-10-CM | POA: Diagnosis present

## 2015-12-02 DIAGNOSIS — E7521 Fabry (-Anderson) disease: Secondary | ICD-10-CM | POA: Diagnosis present

## 2015-12-02 DIAGNOSIS — Z23 Encounter for immunization: Secondary | ICD-10-CM | POA: Diagnosis not present

## 2015-12-02 DIAGNOSIS — J9601 Acute respiratory failure with hypoxia: Secondary | ICD-10-CM | POA: Diagnosis not present

## 2015-12-02 DIAGNOSIS — Y95 Nosocomial condition: Secondary | ICD-10-CM | POA: Diagnosis present

## 2015-12-02 DIAGNOSIS — I480 Paroxysmal atrial fibrillation: Secondary | ICD-10-CM | POA: Diagnosis present

## 2015-12-02 DIAGNOSIS — F0390 Unspecified dementia without behavioral disturbance: Secondary | ICD-10-CM | POA: Diagnosis present

## 2015-12-02 DIAGNOSIS — R1310 Dysphagia, unspecified: Secondary | ICD-10-CM | POA: Diagnosis present

## 2015-12-02 DIAGNOSIS — J189 Pneumonia, unspecified organism: Secondary | ICD-10-CM | POA: Diagnosis not present

## 2015-12-02 DIAGNOSIS — G9340 Encephalopathy, unspecified: Secondary | ICD-10-CM | POA: Diagnosis not present

## 2015-12-02 DIAGNOSIS — F419 Anxiety disorder, unspecified: Secondary | ICD-10-CM | POA: Diagnosis present

## 2015-12-02 DIAGNOSIS — F329 Major depressive disorder, single episode, unspecified: Secondary | ICD-10-CM | POA: Diagnosis present

## 2015-12-02 DIAGNOSIS — I6330 Cerebral infarction due to thrombosis of unspecified cerebral artery: Secondary | ICD-10-CM | POA: Diagnosis not present

## 2015-12-02 DIAGNOSIS — J9600 Acute respiratory failure, unspecified whether with hypoxia or hypercapnia: Secondary | ICD-10-CM | POA: Diagnosis present

## 2015-12-02 DIAGNOSIS — G8191 Hemiplegia, unspecified affecting right dominant side: Secondary | ICD-10-CM | POA: Diagnosis not present

## 2015-12-02 DIAGNOSIS — I5043 Acute on chronic combined systolic (congestive) and diastolic (congestive) heart failure: Secondary | ICD-10-CM | POA: Diagnosis present

## 2015-12-02 LAB — GLUCOSE, CAPILLARY
GLUCOSE-CAPILLARY: 88 mg/dL (ref 65–99)
GLUCOSE-CAPILLARY: 115 mg/dL — AB (ref 65–99)
GLUCOSE-CAPILLARY: 105 mg/dL — AB (ref 65–99)
Glucose-Capillary: 89 mg/dL (ref 65–99)

## 2015-12-02 MED ORDER — ORAL CARE MOUTH RINSE
15.0000 mL | Freq: Two times a day (BID) | OROMUCOSAL | Status: DC
  Administered 2015-12-03 – 2015-12-07 (×10): 15 mL via OROMUCOSAL

## 2015-12-02 MED ORDER — MORPHINE SULFATE (PF) 2 MG/ML IV SOLN: 2.0000 mg | INTRAVENOUS | Status: DC | PRN

## 2015-12-02 MED ORDER — MORPHINE SULFATE (CONCENTRATE) 10 MG/0.5ML PO SOLN: 5.0000 mg | ORAL | 0 refills | Status: DC | PRN

## 2015-12-02 MED ORDER — CHLORHEXIDINE GLUCONATE 0.12 % MT SOLN
15.0000 mL | Freq: Two times a day (BID) | OROMUCOSAL | Status: DC
  Administered 2015-12-03 – 2015-12-07 (×10): 15 mL via OROMUCOSAL
  Filled 2015-12-02 (×9): qty 15

## 2015-12-02 MED ORDER — LORAZEPAM 2 MG/ML PO CONC: 1.0000 mg | Freq: Three times a day (TID) | ORAL | 5 refills | Status: DC | PRN

## 2015-12-02 NOTE — Care Management Obs Status (Signed)
MEDICARE OBSERVATION STATUS NOTIFICATION   Patient Details  Name: Rebecca [name] MRN: [id number] Date of Birth: 09/16/1945   Medicare Observation Status Notification Given:  Yes    Charvis Lightner, Annamarie Major, RN 12/02/2015, 4:39 PM

## 2015-12-02 NOTE — Progress Notes (Signed)
PROGRESS NOTE    CIA GARRETSON  HQI:[id number] DOB: 1945/07/08 DOA: 12/01/2015 PCP: Merrilee Seashore, MD   Brief Narrative: Rebecca [name] is a 70 y.o. with history of combined systolic and diastolic heart failure with last EF of 55%, paroxysmal atrial fibrillation, CVA, hypertension. She presented with acute mid status change and shortness of breath. She was found to have a likely pneumonia on chest x-ray which was being treated during her last admission and pulmonary congestion.   Assessment & Plan:   Active Problems:   Acute encephalopathy   HCAP (healthcare-associated pneumonia)   End of life care   Acute respiratory failure with hypoxia (HCC)   Lactic acidemia   Dyspnea   Acute respiratory failure with hypoxemia Healthcare associated pneumonia This appears likely secondary to HCAP as she did not finish her regimen after discharge secondary to transitioning to comfort care. She'll chest x-ray showed pulmonary vascular congestion which improved after 1 dose of Lasix 40 mg IV on repeat chest x-ray. From chart history, appears patient is doing better than yesterday -Continue vancomycin IV and cefepime  -Continue nasal cannula O2 as needed to keep sats above 92%   Acute encephalopathy Likely secondary to hypoxemia, secondary to pneumonia -We'll treat as above  Anxiety -Continue Ativan  Hypertension  -Hydralazine when necessary  Social   of care has been consult for this complex situation with family. Patient's status has been changed from comfort care to DO NOT RESUSCITATE. Patient will be treated for infection likely causing current decompensation. Healthcare is working with 2 further address goals of care there is a clear direction going forward. Patient's initial plan was for hospice on previous discharge however, hospice was not able to see the patient before return to the hospital.  DVT prophylaxis: Heparin subcutaneous  Code StatuDO NOT RESUSCITATE  Family  Communication:  no family at bedside Disposition Plan: Plan is for discharge to residential hospice versus possible death in the hospital   Consultants:   Palliative care  Procedures:  None  Antimicrobials:  Vancomycin IV (9/13>>  cefepime IV  (9/13>>   Subjective: Patient would not respond to questioning.  Objective: Vitals:   12/01/15 2000 12/01/15 2035 12/02/15 0538 12/02/15 0906  BP: 133/84 (!) 139/93 139/81 136/82  Pulse: (!) 43 70 (!) 53 (!) 58  Resp:  16 15 17   Temp:  97.6 F (36.4 C) 97.7 F (36.5 C) 97.8 F (36.6 C)  TempSrc:  Oral Oral Oral  SpO2: 100% 98% 96% 98%  Weight:  61 kg (134 lb 7.7 oz)    Height:  5\' 4"  (1.626 m)      Intake/Output Summary (Last 24 hours) at 12/02/15 1404 Last data filed at 12/02/15 1339  Gross per 24 hour  Intake              100 ml  Output             2195 ml  Net            -2095 ml   Filed Weights   12/01/15 2035  Weight: 61 kg (134 lb 7.7 oz)    Examination:  General exam: Appears calm and comfortable. No distress Respiratory system: Clear to auscultation. Respiratory effort normal. Cardiovascular system: S1 & S2 heard, RRR.  PMI displaced Gastrointestinal system: Abdomen is nondistended, soft and nontender. Normal bowel sounds heard. Central nervous system: response to voice. Does not respond to commands. Extremities: No edema. No calf tenderness Skin: No cyanosis. No rashes Psychiatry:  Judgement and insight impaired     Data Reviewed: I have personally reviewed following labs and imaging studies  CBC:  Recent Labs Lab 11/26/15 0256 11/27/15 0534 11/28/15 0230 11/29/15 0458 11/30/15 0544 12/01/15 1151  WBC 3.5* 3.2* 3.8* 4.2 3.8*  --   HGB 9.2* 9.0* 8.6* 8.9* 9.3* 11.2*  HCT 29.7* 29.1* 28.5* 28.2* 30.5* 33.0*  MCV 80.7 80.4 80.3 80.1 80.7  --   PLT 168 213 212 190 161  --    Basic Metabolic Panel:  Recent Labs Lab 11/27/15 0534 11/28/15 0230 11/29/15 0458 11/30/15 0544 12/01/15 1151  12/01/15 1233  NA 144 143 143 143 150* 148*  K 3.9 3.8 4.4 4.3 4.8 4.5  CL 113* 115* 114* 114* 115* 116*  CO2 23 22 19* 18*  --  20*  GLUCOSE 106* 109* 99 109* 121* 107*  BUN 15 13 21* 29* 52* 35*  CREATININE 0.91 0.84 1.03* 0.95 1.00 0.93  CALCIUM 8.8* 8.7* 9.2 9.2  --  9.5  MG  --   --  1.9  --   --   --    GFR: Estimated Creatinine Clearance: 47.9 mL/min (by C-G formula based on SCr of 0.93 mg/dL). Liver Function Tests:  Recent Labs Lab 12/01/15 1233  AST 40  ALT 22  ALKPHOS 43  BILITOT 1.1  PROT 7.8  ALBUMIN 3.5   No results for input(s): LIPASE, AMYLASE in the last 168 hours.  Recent Labs Lab 11/27/15 0534  AMMONIA 37*   Coagulation Profile: No results for input(s): INR, PROTIME in the last 168 hours. Cardiac Enzymes:  Recent Labs Lab 11/25/15 1517 11/25/15 2005  TROPONINI 0.19* 0.19*   BNP (last 3 results) No results for input(s): PROBNP in the last 8760 hours. HbA1C: No results for input(s): HGBA1C in the last 72 hours. CBG:  Recent Labs Lab 11/30/15 1128 12/01/15 1142 12/01/15 2116 12/02/15 0736 12/02/15 1157  GLUCAP 113* 118* 130* 88 115*   Lipid Profile: No results for input(s): CHOL, HDL, LDLCALC, TRIG, CHOLHDL, LDLDIRECT in the last 72 hours. Thyroid Function Tests: No results for input(s): TSH, T4TOTAL, FREET4, T3FREE, THYROIDAB in the last 72 hours. Anemia Panel: No results for input(s): VITAMINB12, FOLATE, FERRITIN, TIBC, IRON, RETICCTPCT in the last 72 hours. Sepsis Labs:  Recent Labs Lab 11/25/15 1630 11/26/15 0816 12/01/15 1206 12/01/15 1735  LATICACIDVEN 2.7* 1.5 3.07* 1.76    Recent Results (from the past 240 hour(s))  Culture, blood (Routine X 2) w Reflex to ID Panel     Status: Abnormal   Collection Time: 11/25/15 12:05 PM  Result Value Ref Range Status   Specimen Description BLOOD RIGHT ANTECUBITAL  Final   Special Requests IN PEDIATRIC BOTTLE  1CC  Final   Culture  Setup Time   Final    GRAM POSITIVE COCCI IN  CLUSTERS IN PEDIATRIC BOTTLE CRITICAL RESULT CALLED TO, READ BACK BY AND VERIFIED WITH: N. Batchelder Pharm.D. 14:55 11/26/15 ([email or web address])    Culture (A)  Final    STAPHYLOCOCCUS SPECIES (COAGULASE NEGATIVE) THE SIGNIFICANCE OF ISOLATING THIS ORGANISM FROM A SINGLE SET OF BLOOD CULTURES WHEN MULTIPLE SETS ARE DRAWN IS UNCERTAIN. PLEASE NOTIFY THE MICROBIOLOGY DEPARTMENT WITHIN ONE WEEK IF SPECIATION AND SENSITIVITIES ARE REQUIRED.    Report Status 11/28/2015 FINAL  Final  Culture, blood (Routine X 2) w Reflex to ID Panel     Status: None   Collection Time: 11/25/15 12:28 PM  Result Value Ref Range Status   Specimen Description BLOOD RIGHT HAND  Final   Special Requests IN PEDIATRIC BOTTLE  1CC  Final   Culture NO GROWTH 5 DAYS  Final   Report Status 11/30/2015 FINAL  Final  Urine culture     Status: None   Collection Time: 11/25/15 12:43 PM  Result Value Ref Range Status   Specimen Description URINE, CATHETERIZED  Final   Special Requests NONE  Final   Culture NO GROWTH  Final   Report Status 11/26/2015 FINAL  Final  Gram stain     Status: None   Collection Time: 11/25/15 12:43 PM  Result Value Ref Range Status   Specimen Description URINE, CATHETERIZED  Final   Special Requests NONE  Final   Gram Stain   Final    WBC PRESENT,BOTH PMN AND MONONUCLEAR GRAM POSITIVE RODS GRAM POSITIVE COCCI IN CLUSTERS GRAM NEGATIVE RODS CYTOSPIN SMEAR    Report Status 11/25/2015 FINAL  Final  MRSA PCR Screening     Status: None   Collection Time: 11/25/15  2:52 PM  Result Value Ref Range Status   MRSA by PCR NEGATIVE NEGATIVE Final    Comment:        The GeneXpert MRSA Assay (FDA approved for NASAL specimens only), is one component of a comprehensive MRSA colonization surveillance program. It is not intended to diagnose MRSA infection nor to guide or monitor treatment for MRSA infections.          Radiology Studies: Ct Head Wo Contrast  Result Date: 12/01/2015 CLINICAL DATA:   Altered mental status. EXAM: CT HEAD WITHOUT CONTRAST TECHNIQUE: Contiguous axial images were obtained from the base of the skull through the vertex without intravenous contrast. COMPARISON:  CT brain 11/27/2015 FINDINGS: Brain: No evidence of acute infarction, hemorrhage, extra-axial collection, ventriculomegaly, or mass effect. Old left frontal lobe infarct with encephalomalacia. Old right posterior parietal lobe infarct. Generalized cerebral atrophy. Periventricular white matter low attenuation likely secondary to microangiopathy. Vascular: Cerebrovascular atherosclerotic calcifications are noted. Skull: Negative for fracture or focal lesion. Sinuses/Orbits: Visualized portions of the orbits are unremarkable. Visualized portions of the paranasal sinuses and mastoid air cells are unremarkable. Other: None. IMPRESSION: 1. No acute intracranial pathology. Electronically Signed   By: Elige Ko   On: 12/01/2015 12:51   Dg Chest Port 1 View  Result Date: 12/01/2015 CLINICAL DATA:  Acute respiratory failure.  Atrial fibrillation. EXAM: PORTABLE CHEST 1 VIEW COMPARISON:  12/01/2015 FINDINGS: Stable moderate cardiomegaly.  Aortic atherosclerosis. Mild airspace disease is again seen in the lateral right lung base. No evidence of pulmonary edema or pleural effusion. IMPRESSION: No significant change in mild right basilar airspace opacity, suspicious for pneumonia. Stable cardiomegaly.  No evidence of congestive heart failure. Aortic atherosclerosis. Electronically Signed   By: Myles Rosenthal M.D.   On: 12/01/2015 17:59   Dg Chest Portable 1 View  Result Date: 12/01/2015 CLINICAL DATA:  Altered mental status, low oxygen saturation, recent stroke and hospitalization, history diabetes mellitus, hypertension, atrial fibrillation, smoking EXAM: PORTABLE CHEST 1 VIEW COMPARISON:  Portable exam 1258 hours compared to 11/25/2015 FINDINGS: Enlargement of cardiac silhouette with pulmonary vascular congestion. Tortuous  thoracic aorta with atherosclerotic calcification. Bibasilar subsegmental atelectasis greater on RIGHT. Lungs otherwise clear. No pleural effusion or pneumothorax. Probable BILATERAL chronic rotator cuff tears. IMPRESSION: Enlargement of cardiac silhouette with pulmonary vascular congestion. Bibasilar subsegmental atelectasis greater on RIGHT. Electronically Signed   By: Ulyses Southward M.D.   On: 12/01/2015 13:17        Scheduled Meds: . aspirin EC  81 mg  Oral Daily  . ceFEPime (MAXIPIME) IV  2 g Intravenous Q24H  . furosemide  40 mg Intravenous BID  . heparin  5,000 Units Subcutaneous Q8H  . sodium chloride flush  3 mL Intravenous Q12H  . vancomycin  500 mg Intravenous Q12H   Continuous Infusions:    LOS: 0 days     Rebecca [name] [facility] 12/02/2015, 2:04 PM Pager: [phone]  If 7PM-7AM, please contact night-coverage [email or web address] Password [email or web address] 12/02/2015, 2:04 PM

## 2015-12-02 NOTE — Consult Note (Signed)
Consultation Note Date: 12/02/2015   Patient Name: Rebecca [name]  DOB: 1945-12-14  MRN: [id number]  Age / Sex: 70 y.o., female  PCP: Hennie Duos, MD Referring Physician: Mariel Aloe, MD  Reason for Consultation: Establishing goals of care, Psychosocial/spiritual support and Terminal Care  HPI/Patient Profile: 70 y.o. female  with past medical history of Fabry's disease, stroke, expressive aphasia, and atrial fibrillation who was admitted on 12/01/2015 with hypoxia likely secondary to a heart failure exacerbation.  Ms. Dicenzo had just been discharged from the hospital (the same 24 hour period) to SNF after being treated for acute encephalopathy.  She was supposed to be followed by Hospice at [facility] but her decline was so sudden that Hospice had not yet had time to make an initial visit.   During her most recent hospitalization she was treated for HCAP vs aspiration pneumonia and was found to have had multiple small acute infarctions in the left MCA territory.  She had stopped eating and was refusing medications.    Clinical Assessment and Goals of Care: I met last admission with the patient and her son Nancee Liter several times.  Nancee Liter is his mother's primary care taker and is extremely dedicated to her.  Ms. Milson lost her other son to kidney failure last month.  The patient and her two sons all have 50 disease. Daryn agreed that Hospice and comfort measures was the best course of action for his mother.  She remained a full code however because Daryn would not change the code status without his sister's agreement.  The sister, Carlyon Shadow, is in [place] and according to [place] she is very busy with work and concerned about finances  - and thus she is unable to come to [facility].  Daryn was willing to intubate his mother in order to keep her alive until his sister could come to [facility].  This admission, Ms. Ronning was made DNR  on admission with Daryn's consent.  She appears to be actively dying.  My understanding is that the admitting doctor explained to Daryn that Ms. Flicker will be admitted and diuresed.  He recommended comfort medications PRN and no escalation of oxygen therapy.  I spoke to Daryn this morning on the phone after examining Ms. Pardon.  I explained that I think she is in the process of dying and may be most comfortable at residential hospice.  Nancee Liter is exhausted.  He was on the phone with family members most of the night and will not be in the hospital today.   I asked for his sister's name and number and I contacted her.  She would not take my phone call and since Daryn suggested I text her - I did.  The response to my text was filled with aggravated emotion, and it was very evident that texting was not an appropriate form of communication.     In addition to the stress of multiple hospitalizations for Ms. Guerrier the family is trying to cope with the death of their brother 57 month  ago.  Their emotional response is inhibiting their ability to make decisions regarding her care or disposition at this point.  Primary decision maker is:  Architect (two adult children)   SUMMARY OF RECOMMENDATIONS    Palliative Medicine will touch base with Daryn again tomorrow.  Code Status/Advance Care Planning:  DNR   Symptom Management:   Low dose morphine for pain or dyspnea   Palliative Prophylaxis:   Delirium Protocol, Eye Care, Frequent Pain Assessment, Oral Care and Turn Reposition   Psycho-social/Spiritual:   Desire for further Chaplaincy support: no  Additional Recommendations: Caregiving  Support/Resources  Prognosis:   Hours - Days  Discharge Planning: Anticipated Hospital Death vs residential hospice     Primary Diagnoses: Present on Admission: . HCAP (healthcare-associated pneumonia) . Acute encephalopathy   I have reviewed the medical record, interviewed the patient  and family, and examined the patient. The following aspects are pertinent.  Past Medical History:  Diagnosis Date  . Atrial fibrillation ([place]) 05/01/2013  . Depression   . Diabetes mellitus without complication ([place])   . Expressive aphasia 03/23/2013  . Fabry disease ([place]) 05/01/2013  . Fabry's disease ([place])   . Hypertension   . Stroke ([place])    2015  . Stroke [facility]) 2016   aphasia  . Tobacco abuse 05/01/2013   Social History   Social History  . Marital status: Single    Spouse name: N/A  . Number of children: N/A  . Years of education: N/A   Social History Main Topics  . Smoking status: Never Smoker  . Smokeless tobacco: Never Used  . Alcohol use No  . Drug use: No  . Sexual activity: Not Currently   Other Topics Concern  . None   Social History Narrative  . None   Family History  Problem Relation Age of Onset  . Alcohol abuse Maternal Aunt    Scheduled Meds: . aspirin EC  81 mg Oral Daily  . ceFEPime (MAXIPIME) IV  2 g Intravenous Q24H  . furosemide  40 mg Intravenous BID  . heparin  5,000 Units Subcutaneous Q8H  . sodium chloride flush  3 mL Intravenous Q12H  . vancomycin  500 mg Intravenous Q12H   Continuous Infusions:  PRN Meds:.sodium chloride, bisacodyl, morphine injection, ondansetron **OR** ondansetron (ZOFRAN) IV, sodium chloride flush Medications Prior to Admission:  Prior to Admission medications   Medication Sig Start Date End Date Taking? Authorizing Provider  acetaminophen (TYLENOL) 325 MG tablet Take 2 tablets (650 mg total) by mouth every 6 (six) hours as needed for mild pain (or Fever >/= 101). 11/30/15  Yes Eugenie Filler, MD  acetaminophen (TYLENOL) 650 MG suppository Place 1 suppository (650 mg total) rectally every 6 (six) hours as needed for mild pain (or Fever >/= 101). 11/30/15  Yes Eugenie Filler, MD  apixaban (ELIQUIS) 5 MG TABS tablet Take 5 mg by mouth 2 (two) times daily.    Yes Historical Provider, MD  aspirin EC 81 MG EC tablet  Take 1 tablet (81 mg total) by mouth daily. 11/30/15  Yes Eugenie Filler, MD  bisacodyl (DULCOLAX) 10 MG suppository Place 1 suppository (10 mg total) rectally as needed for moderate constipation. 11/30/15  Yes Eugenie Filler, MD  LORazepam (ATIVAN) 2 MG/ML concentrated solution Take 0.5 mLs (1 mg total) by mouth every 8 (eight) hours as needed for anxiety, sedation or sleep (nausea). 11/30/15  Yes Eugenie Filler, MD  metoprolol tartrate (LOPRESSOR) 25 MG tablet Take  0.5 tablets (12.5 mg total) by mouth 2 (two) times daily. 04/07/14  Yes Allie Bossier, MD  mirtazapine (REMERON) 7.5 MG tablet Take 7.5 mg by mouth at bedtime.   Yes Historical Provider, MD  Morphine Sulfate (MORPHINE CONCENTRATE) 10 MG/0.5ML SOLN concentrated solution Place 0.25 mLs (5 mg total) under the tongue every 2 (two) hours as needed for moderate pain or shortness of breath. 11/30/15  Yes Eugenie Filler, MD  sertraline (ZOLOFT) 50 MG tablet Take 75 mg by mouth daily.    Yes Historical Provider, MD  Skin Protectants, Misc. (CALAZIME SKIN PROTECTANT EX) Apply 1 application topically 2 (two) times daily. TO BUTTOCKS   Yes Historical Provider, MD   Allergies  Allergen Reactions  . Lisinopril Swelling   Review of Systems:  Patient asleep and aphasic  Physical Exam  Frail female, resting,  Resp apneic breathing CV irreg irreg Abdomen Thin, nd Ext no edema   Vital Signs: BP 136/82 (BP Location: Right Arm)   Pulse (!) 58   Temp 97.8 F (36.6 C) (Oral)   Resp 17   Ht 5' 4"  (1.626 m)   Wt 61 kg (134 lb 7.7 oz)   SpO2 98%   BMI 23.08 kg/m      Pain Score: 0-No pain   SpO2: SpO2: 98 % O2 Device:SpO2: 98 % O2 Flow Rate: .O2 Flow Rate (L/min): 2 L/min  IO: Intake/output summary:  Intake/Output Summary (Last 24 hours) at 12/02/15 1112 Last data filed at 12/02/15 1043  Gross per 24 hour  Intake              100 ml  Output             2195 ml  Net            -2095 ml    LBM: Last BM Date:  11/30/15 Baseline Weight: Weight: 61 kg (134 lb 7.7 oz) Most recent weight: Weight: 61 kg (134 lb 7.7 oz)     Palliative Assessment/Data:   Flowsheet Rows   Flowsheet Row Most Recent Value  Intake Tab  Referral Department  -- [ED]  Unit at Time of Referral  ER  Palliative Care Primary Diagnosis  Neurology  Date Notified  12/01/15  Palliative Care Type  Return patient Palliative Care  Reason for referral  Clarify Goals of Care  Date of Admission  12/01/15  # of days IP prior to Palliative referral  0  Clinical Assessment  Psychosocial & Spiritual Assessment  Palliative Care Outcomes      Time In: 11:00 Time Out: 12:00 Time Total: 60 min. Greater than 50%  of this time was spent counseling and coordinating care related to the above assessment and plan.  Signed by:  Imogene Burn, PA-C Palliative Medicine Pager: [phone]   Please contact Palliative Medicine Team phone at [phone] for questions and concerns.  For individual provider: See Shea Evans

## 2015-12-02 NOTE — Telephone Encounter (Signed)
Nursing home medication refill.

## 2015-12-02 NOTE — Progress Notes (Signed)
No charge note  Attempted to call daughter, Agustin Cree, in [place] [phone].  Voice mail is full.  Patient appears to be actively dying.  Son Lobbyist) understands but is reluctant to make any decisions without his sister.  He is absolutely exhausted today and will not return to the hospital until tomorrow.  She was d/c'd to SNF yesterday and sent back to the hospital with in the day.  They were unable to handle her.   She is residential hospice appropriate.    I will continue to attempt to reach Darlene.  Algis Downs, [place] Palliative Medicine Pager: [phone]

## 2015-12-03 ENCOUNTER — Other Ambulatory Visit: Payer: Self-pay | Admitting: *Deleted

## 2015-12-03 DIAGNOSIS — E872 Acidosis: Secondary | ICD-10-CM

## 2015-12-03 MED ORDER — LEVOFLOXACIN IN D5W 500 MG/100ML IV SOLN
500.0000 mg | Freq: Once | INTRAVENOUS | Status: AC
  Administered 2015-12-03: 500 mg via INTRAVENOUS
  Filled 2015-12-03: qty 100

## 2015-12-03 MED ORDER — LEVOFLOXACIN 500 MG PO TABS: 500.0000 mg | ORAL_TABLET | ORAL | Status: DC

## 2015-12-03 MED ORDER — LEVOFLOXACIN 500 MG PO TABS
500.0000 mg | ORAL_TABLET | ORAL | Status: DC
  Administered 2015-12-04: 500 mg via ORAL
  Filled 2015-12-03: qty 1

## 2015-12-03 MED ORDER — LORAZEPAM 1 MG PO TABS: ORAL_TABLET | ORAL | 0 refills | Status: DC

## 2015-12-03 NOTE — Progress Notes (Addendum)
PROGRESS NOTE    Rebecca [name]  XUX:[id number] DOB: [birth year] DOA: 12/01/2015 PCP: Merrilee Seashore, MD   Brief Narrative: Rebecca [name] is a 70 y.o. with history of combined systolic and diastolic heart failure with last EF of 55%, paroxysmal atrial fibrillation, CVA, hypertension. She presented with acute mid status change and shortness of breath. She was found to have a likely pneumonia on chest x-ray which was being treated during her last admission and pulmonary congestion.   Assessment & Plan:   Active Problems:   Acute encephalopathy   HCAP (healthcare-associated pneumonia)   End of life care   Acute respiratory failure with hypoxia (HCC)   Lactic acidemia   Dyspnea   Acute on chronic respiratory failure (HCC)   Acute respiratory failure with hypoxemia Healthcare associated pneumonia Acute on chronic CHF exacerbation - present on admission and resolved shortly after admission This appears likely secondary to HCAP as she did not finish her regimen after discharge secondary to transitioning to comfort care. She'll chest x-ray showed pulmonary vascular congestion which improved after 1 dose of Lasix 40 mg IV on repeat chest x-ray. From chart history, appears patient is doing better than yesterday. Appears to have improved responsiveness. -Continue vancomycin IV and cefepime today. Patient has been afebrile, blood/urine cultures not obtained secondary to anticipated disposition. At this point, will transition to to oral Levaquin tomorrow -Continue nasal cannula O2 as needed to keep sats above 92%  Acute encephalopathy Likely secondary to hypoxemia, secondary to pneumonia. Appears more responsive today. Still does not do anything with regard to commands -Will treat as above -Dysphagia 1 diet per previous SLP recommendations -SLP reevaluation  Anxiety -Continue Ativan  Hypertension  -Hydralazine when necessary  Social  Palliative care has been consult for this  complex situation with family. Patient's status has been changed from comfort care to DO NOT RESUSCITATE. Patient will be treated for infection likely causing current decompensation. Healthcare is working with 2 further address goals of care there is a clear direction going forward. Patient's initial plan was for hospice on previous discharge however, hospice was not able to see the patient before return to the hospital. -follow-up with son today -Patient is NPO. Will need to figure out nutrition goals. I do not think perenteral feeding would be a good direction for this patient.  Addendum: Have tried to get in touch with [name] to no avail. It appears that palliative care has been fired by family. Unsure of where this is going with regard to patient's care. I anticipate this may need a consult to the ethics.  DVT prophylaxis: Heparin subcutaneous  Code Status: DO NOT RESUSCITATE  Family Communication:  no family at bedside. Disposition Plan: Plan is for discharge to residential hospice vs back to SNF   Consultants:   Palliative care  Procedures:  None  Antimicrobials:  Vancomycin IV (9/13>>  cefepime IV  (9/13>>   Subjective: Patient would not respond to questioning.  Objective: Vitals:   12/02/15 1723 12/02/15 2013 12/03/15 0506 12/03/15 0739  BP: 125/79 128/78 127/74 (!) 141/81  Pulse: (!) 59 61 (!) 107 (!) 52  Resp: 16 16 15 16   Temp: 98 F (36.7 C) 98.8 F (37.1 C) 98 F (36.7 C) 98.2 F (36.8 C)  TempSrc: Oral Oral  Oral  SpO2: 99% 100% 90% 100%  Weight:      Height:        Intake/Output Summary (Last 24 hours) at 12/03/15 1557 Last data filed at 12/03/15  1406  Gross per 24 hour  Intake           391.33 ml  Output             1875 ml  Net         -1483.67 ml   Filed Weights   12/01/15 2035  Weight: 61 kg (134 lb 7.7 oz)    Examination:  General exam: Appears calm and comfortable. No distress Respiratory system: Clear to auscultation.  Respiratory effort normal. Cardiovascular system: S1 & S2 heard, RRR.  PMI displaced Gastrointestinal system: Abdomen is nondistended, soft and nontender. Normal bowel sounds heard. Central nervous system: response to voice. Does not respond to commands. Extremities: No edema. No calf tenderness Skin: No cyanosis. No rashes Psychiatry: Judgement and insight impaired     Data Reviewed: I have personally reviewed following labs and imaging studies  CBC:  Recent Labs Lab 11/27/15 0534 11/28/15 0230 11/29/15 0458 11/30/15 0544 12/01/15 1151  WBC 3.2* 3.8* 4.2 3.8*  --   HGB 9.0* 8.6* 8.9* 9.3* 11.2*  HCT 29.1* 28.5* 28.2* 30.5* 33.0*  MCV 80.4 80.3 80.1 80.7  --   PLT 213 212 190 161  --    Basic Metabolic Panel:  Recent Labs Lab 11/27/15 0534 11/28/15 0230 11/29/15 0458 11/30/15 0544 12/01/15 1151 12/01/15 1233  NA 144 143 143 143 150* 148*  K 3.9 3.8 4.4 4.3 4.8 4.5  CL 113* 115* 114* 114* 115* 116*  CO2 23 22 19* 18*  --  20*  GLUCOSE 106* 109* 99 109* 121* 107*  BUN 15 13 21* 29* 52* 35*  CREATININE 0.91 0.84 1.03* 0.95 1.00 0.93  CALCIUM 8.8* 8.7* 9.2 9.2  --  9.5  MG  --   --  1.9  --   --   --    GFR: Estimated Creatinine Clearance: 47.9 mL/min (by C-G formula based on SCr of 0.93 mg/dL). Liver Function Tests:  Recent Labs Lab 12/01/15 1233  AST 40  ALT 22  ALKPHOS 43  BILITOT 1.1  PROT 7.8  ALBUMIN 3.5   No results for input(s): LIPASE, AMYLASE in the last 168 hours.  Recent Labs Lab 11/27/15 0534  AMMONIA 37*   Coagulation Profile: No results for input(s): INR, PROTIME in the last 168 hours. Cardiac Enzymes: No results for input(s): CKTOTAL, CKMB, CKMBINDEX, TROPONINI in the last 168 hours. BNP (last 3 results) No results for input(s): PROBNP in the last 8760 hours. HbA1C: No results for input(s): HGBA1C in the last 72 hours. CBG:  Recent Labs Lab 12/01/15 2116 12/02/15 0736 12/02/15 1157 12/02/15 1719 12/02/15 2132  GLUCAP  130* 88 115* 89 105*   Lipid Profile: No results for input(s): CHOL, HDL, LDLCALC, TRIG, CHOLHDL, LDLDIRECT in the last 72 hours. Thyroid Function Tests: No results for input(s): TSH, T4TOTAL, FREET4, T3FREE, THYROIDAB in the last 72 hours. Anemia Panel: No results for input(s): VITAMINB12, FOLATE, FERRITIN, TIBC, IRON, RETICCTPCT in the last 72 hours. Sepsis Labs:  Recent Labs Lab 12/01/15 1206 12/01/15 1735  LATICACIDVEN 3.07* 1.76    Recent Results (from the past 240 hour(s))  Culture, blood (Routine X 2) w Reflex to ID Panel     Status: Abnormal   Collection Time: 11/25/15 12:05 PM  Result Value Ref Range Status   Specimen Description BLOOD RIGHT ANTECUBITAL  Final   Special Requests IN PEDIATRIC BOTTLE  1CC  Final   Culture  Setup Time   Final    GRAM POSITIVE COCCI  IN CLUSTERS IN PEDIATRIC BOTTLE CRITICAL RESULT CALLED TO, READ BACK BY AND VERIFIED WITH: N. Batchelder Pharm.D. 14:55 11/26/15 ([email or web address])    Culture (A)  Final    STAPHYLOCOCCUS SPECIES (COAGULASE NEGATIVE) THE SIGNIFICANCE OF ISOLATING THIS ORGANISM FROM A SINGLE SET OF BLOOD CULTURES WHEN MULTIPLE SETS ARE DRAWN IS UNCERTAIN. PLEASE NOTIFY THE MICROBIOLOGY DEPARTMENT WITHIN ONE WEEK IF SPECIATION AND SENSITIVITIES ARE REQUIRED.    Report Status 11/28/2015 FINAL  Final  Culture, blood (Routine X 2) w Reflex to ID Panel     Status: None   Collection Time: 11/25/15 12:28 PM  Result Value Ref Range Status   Specimen Description BLOOD RIGHT HAND  Final   Special Requests IN PEDIATRIC BOTTLE  1CC  Final   Culture NO GROWTH 5 DAYS  Final   Report Status 11/30/2015 FINAL  Final  Urine culture     Status: None   Collection Time: 11/25/15 12:43 PM  Result Value Ref Range Status   Specimen Description URINE, CATHETERIZED  Final   Special Requests NONE  Final   Culture NO GROWTH  Final   Report Status 11/26/2015 FINAL  Final  Gram stain     Status: None   Collection Time: 11/25/15 12:43 PM  Result Value Ref  Range Status   Specimen Description URINE, CATHETERIZED  Final   Special Requests NONE  Final   Gram Stain   Final    WBC PRESENT,BOTH PMN AND MONONUCLEAR GRAM POSITIVE RODS GRAM POSITIVE COCCI IN CLUSTERS GRAM NEGATIVE RODS CYTOSPIN SMEAR    Report Status 11/25/2015 FINAL  Final  MRSA PCR Screening     Status: None   Collection Time: 11/25/15  2:52 PM  Result Value Ref Range Status   MRSA by PCR NEGATIVE NEGATIVE Final    Comment:        The GeneXpert MRSA Assay (FDA approved for NASAL specimens only), is one component of a comprehensive MRSA colonization surveillance program. It is not intended to diagnose MRSA infection nor to guide or monitor treatment for MRSA infections.          Radiology Studies: Dg Chest Port 1 View  Result Date: 12/01/2015 CLINICAL DATA:  Acute respiratory failure.  Atrial fibrillation. EXAM: PORTABLE CHEST 1 VIEW COMPARISON:  12/01/2015 FINDINGS: Stable moderate cardiomegaly.  Aortic atherosclerosis. Mild airspace disease is again seen in the lateral right lung base. No evidence of pulmonary edema or pleural effusion. IMPRESSION: No significant change in mild right basilar airspace opacity, suspicious for pneumonia. Stable cardiomegaly.  No evidence of congestive heart failure. Aortic atherosclerosis. Electronically Signed   By: Myles RosenthalJohn  Stahl M.D.   On: 12/01/2015 17:59        Scheduled Meds: . aspirin EC  81 mg Oral Daily  . ceFEPime (MAXIPIME) IV  2 g Intravenous Q24H  . chlorhexidine  15 mL Mouth Rinse BID  . furosemide  40 mg Intravenous BID  . heparin  5,000 Units Subcutaneous Q8H  . mouth rinse  15 mL Mouth Rinse q12n4p  . sodium chloride flush  3 mL Intravenous Q12H  . vancomycin  500 mg Intravenous Q12H   Continuous Infusions:    LOS: 1 day     Jacquelin HawkingRalph Nettey [facility] 12/03/2015, 3:57 PM Pager: [phone]  If 7PM-7AM, please contact night-coverage [email or web address] Password [email or web address] 12/03/2015, 3:57 PM

## 2015-12-03 NOTE — Telephone Encounter (Signed)
[facility] #[phone] Fax:[phone]  

## 2015-12-03 NOTE — Progress Notes (Signed)
Pharmacy Antibiotic Note  Rebecca [name] is a 70 y.o. female admitted on 12/01/2015 with AMS and SOB.  Pharmacy has been consulted for vancomycin dosing.   Patient recently hospitalized with HCAP, hospice care was being pursued however she was not out of the hospital long enough for care to be established. Palliative care is involved.  Plan: -Vancomycin 500mg  IV q12hr. Vancomycin goal trough 15-20 mcg/mL -Cefepime 2g q24 hr - dose verified by pharmacy  -BMET in the morning, vancomycin trough PRN  Temp (24hrs), Avg:98.3 F (36.8 C), Min:98 F (36.7 C), Max:98.8 F (37.1 C)   Recent Labs Lab 11/27/15 0534 11/28/15 0230 11/29/15 0458 11/30/15 0544 12/01/15 1151 12/01/15 1206 12/01/15 1233 12/01/15 1735  WBC 3.2* 3.8* 4.2 3.8*  --   --   --   --   CREATININE 0.91 0.84 1.03* 0.95 1.00  --  0.93  --   LATICACIDVEN  --   --   --   --   --  3.07*  --  1.76    Estimated Creatinine Clearance: 47.9 mL/min (by C-G formula based on SCr of 0.93 mg/dL).    Allergies  Allergen Reactions  . Lisinopril Swelling    Antimicrobials this admission: Cefepime 9/12>>  Vanc 9/12>>   Dose adjustments this admission: N/A  Microbiology results: none sent  Thank you for allowing pharmacy to be a part of this patient's care.  Jaonna Word D. Jamilett Ferrante, PharmD, BCPS Clinical Pharmacist Pager: [phone] 12/03/2015 10:19 AM

## 2015-12-03 NOTE — Progress Notes (Signed)
Nutrition Brief Note  Pt identified by a low braden score. Chart reviewed. Palliative care is following. Per Palliative note, pt appears to be actively dying. Pt is currently NPO. Per MD note, plan is to discharge to residential hospice versus possible death in the hospital. No further nutrition interventions warranted at this time. Please consult as needed.   Roslyn Smiling, MS, RD, LDN Pager # [phone] After hours/ weekend pager # [phone]

## 2015-12-03 NOTE — Progress Notes (Signed)
No charge note.   Patient's daughter requested that Palliative Medicine step back from supporting the patient.   I have left a message for Charlean Sanfilippo [facility]) to call our office [phone]) if there is anything at all we can do to support either him or his mother.  Algis Downs, [place] Palliative Medicine Pager: [phone]

## 2015-12-04 LAB — GLUCOSE, CAPILLARY: Glucose-Capillary: 86 mg/dL (ref 65–99)

## 2015-12-04 MED ORDER — INFLUENZA VAC SPLIT QUAD 0.5 ML IM SUSY
0.5000 mL | PREFILLED_SYRINGE | INTRAMUSCULAR | Status: AC
  Administered 2015-12-05: 0.5 mL via INTRAMUSCULAR

## 2015-12-04 NOTE — Progress Notes (Signed)
[facility]  PROGRESS NOTE  Rebecca [name] WGN:[id number] DOB: Sep 01, 1945 DOA: 12/01/2015 PCP: Merrilee Seashore, MD   Brief HPI:  70 y.o. with history of combined systolic and diastolic heart failure with last EF of 55%, paroxysmal atrial fibrillation, CVA, hypertension. She presented with acute mid status change and shortness of breath. She was found to have a likely pneumonia on chest x-ray which was being treated during her last admission and pulmonary congestion.    Subjective    Patient seen and examined, nonverbal. Denies any pain at this time.    Assessment/Plan:     1. Acute hypoxic respiratory failure-multifactorial, likely from healthcare associated pneumonia and CHF. Patient given Lasix, IV antibiotics vancomycin and cefepime have been discontinued and patient started on Levaquin. 2. Encephalopathy- improving, likely from above, patient more responsive but  continue to be nonverbal. 3. Anxiety- continue when necessary Ativan 4. Hypertension-continue when necessary hydralazine  Goals of care-palliative care was consulted, patient was changed from comfort care to DO NOT RESUSCITATE. Palliative care has been fired by patient's daughter. As per my discussion with patient's son, patient now FULL CODE. He wants her to go back to skilled nursing facility with hospice     DVT prophylaxis: Heparin  Code Status: Full code, called patient's son today and he changed her from DO NOT RESUSCITATE to full code  Family Communication: Discussed with patient's son on phone, he wants her to go back to [facility] with hospice services.  Disposition Plan: Skilled nursing facility   Consultants:  Palliative care  Procedures:  None  Antibiotics:   Anti-infectives    Start     Dose/Rate Route Frequency Ordered Stop   12/04/15 2000  levofloxacin (LEVAQUIN) tablet 500 mg     500 mg Oral Every 24 hours 12/03/15 1959     12/03/15 2100  levofloxacin (LEVAQUIN) IVPB 500  mg     500 mg 100 mL/hr over 60 Minutes Intravenous  Once 12/03/15 1959 12/03/15 2318   12/03/15 1915  levofloxacin (LEVAQUIN) tablet 500 mg  Status:  Discontinued     500 mg Oral Every 24 hours 12/03/15 1857 12/03/15 1959   12/02/15 0800  vancomycin (VANCOCIN) 500 mg in sodium chloride 0.9 % 100 mL IVPB  Status:  Discontinued     500 mg 100 mL/hr over 60 Minutes Intravenous Every 12 hours 12/01/15 1912 12/01/15 1932   12/02/15 0800  vancomycin (VANCOCIN) 500 mg in sodium chloride 0.9 % 100 mL IVPB  Status:  Discontinued     500 mg 100 mL/hr over 60 Minutes Intravenous Every 12 hours 12/01/15 1932 12/03/15 1857   12/01/15 1900  vancomycin (VANCOCIN) IVPB 1000 mg/200 mL premix     1,000 mg 200 mL/hr over 60 Minutes Intravenous  Once 12/01/15 1848 12/01/15 2047   12/01/15 1845  ceFEPIme (MAXIPIME) 2 g in dextrose 5 % 50 mL IVPB  Status:  Discontinued     2 g 100 mL/hr over 30 Minutes Intravenous Every 24 hours 12/01/15 1826 12/03/15 1857       Objective   Vitals:   12/03/15 2044 12/04/15 0458 12/04/15 0746 12/04/15 1744  BP: 138/88 131/84 104/87 111/70  Pulse: (!) 56 (!) 53 (!) 59 (!) 56  Resp: 14 14 15 16   Temp: 97.9 F (36.6 C) 99.5 F (37.5 C) 98.4 F (36.9 C) 98.7 F (37.1 C)  TempSrc: Axillary Axillary Oral Oral  SpO2: 100% 100% 100% 100%  Weight: 59.9 kg (132 lb 0.9 oz)  Height:        Intake/Output Summary (Last 24 hours) at 12/04/15 1834 Last data filed at 12/04/15 1249  Gross per 24 hour  Intake                0 ml  Output             1325 ml  Net            -1325 ml   Filed Weights   12/01/15 2035 12/03/15 2044  Weight: 61 kg (134 lb 7.7 oz) 59.9 kg (132 lb 0.9 oz)     Physical Examination:  General exam: Appears calm and comfortable. Respiratory system: Clear to auscultation. Respiratory effort normal. Cardiovascular system:  RRR. No  murmurs, rubs, gallops. No pedal edema. GI system: Abdomen is nondistended, soft and nontender. No organomegaly.   Central nervous system. No focal neurological deficits. 5 x 5 power in all extremities. Skin: No rashes, lesions or ulcers. Psychiatry: Alert, oriented x 3.Judgement and insight appear normal. Affect normal.    Data Reviewed: I have personally reviewed following labs and imaging studies  CBG:  Recent Labs Lab 12/01/15 2116 12/02/15 0736 12/02/15 1157 12/02/15 1719 12/02/15 2132  GLUCAP 130* 88 115* 89 105*    CBC:  Recent Labs Lab 11/28/15 0230 11/29/15 0458 11/30/15 0544 12/01/15 1151  WBC 3.8* 4.2 3.8*  --   HGB 8.6* 8.9* 9.3* 11.2*  HCT 28.5* 28.2* 30.5* 33.0*  MCV 80.3 80.1 80.7  --   PLT 212 190 161  --     Basic Metabolic Panel:  Recent Labs Lab 11/28/15 0230 11/29/15 0458 11/30/15 0544 12/01/15 1151 12/01/15 1233  NA 143 143 143 150* 148*  K 3.8 4.4 4.3 4.8 4.5  CL 115* 114* 114* 115* 116*  CO2 22 19* 18*  --  20*  GLUCOSE 109* 99 109* 121* 107*  BUN 13 21* 29* 52* 35*  CREATININE 0.84 1.03* 0.95 1.00 0.93  CALCIUM 8.7* 9.2 9.2  --  9.5  MG  --  1.9  --   --   --     Recent Results (from the past 240 hour(s))  Culture, blood (Routine X 2) w Reflex to ID Panel     Status: Abnormal   Collection Time: 11/25/15 12:05 PM  Result Value Ref Range Status   Specimen Description BLOOD RIGHT ANTECUBITAL  Final   Special Requests IN PEDIATRIC BOTTLE  1CC  Final   Culture  Setup Time   Final    GRAM POSITIVE COCCI IN CLUSTERS IN PEDIATRIC BOTTLE CRITICAL RESULT CALLED TO, READ BACK BY AND VERIFIED WITH: N. Batchelder Pharm.D. 14:55 11/26/15 ([email or web address])    Culture (A)  Final    STAPHYLOCOCCUS SPECIES (COAGULASE NEGATIVE) THE SIGNIFICANCE OF ISOLATING THIS ORGANISM FROM A SINGLE SET OF BLOOD CULTURES WHEN MULTIPLE SETS ARE DRAWN IS UNCERTAIN. PLEASE NOTIFY THE MICROBIOLOGY DEPARTMENT WITHIN ONE WEEK IF SPECIATION AND SENSITIVITIES ARE REQUIRED.    Report Status 11/28/2015 FINAL  Final  Culture, blood (Routine X 2) w Reflex to ID Panel     Status: None    Collection Time: 11/25/15 12:28 PM  Result Value Ref Range Status   Specimen Description BLOOD RIGHT HAND  Final   Special Requests IN PEDIATRIC BOTTLE  1CC  Final   Culture NO GROWTH 5 DAYS  Final   Report Status 11/30/2015 FINAL  Final  Urine culture     Status: None   Collection Time: 11/25/15 12:43 PM  Result  Value Ref Range Status   Specimen Description URINE, CATHETERIZED  Final   Special Requests NONE  Final   Culture NO GROWTH  Final   Report Status 11/26/2015 FINAL  Final  Gram stain     Status: None   Collection Time: 11/25/15 12:43 PM  Result Value Ref Range Status   Specimen Description URINE, CATHETERIZED  Final   Special Requests NONE  Final   Gram Stain   Final    WBC PRESENT,BOTH PMN AND MONONUCLEAR GRAM POSITIVE RODS GRAM POSITIVE COCCI IN CLUSTERS GRAM NEGATIVE RODS CYTOSPIN SMEAR    Report Status 11/25/2015 FINAL  Final  MRSA PCR Screening     Status: None   Collection Time: 11/25/15  2:52 PM  Result Value Ref Range Status   MRSA by PCR NEGATIVE NEGATIVE Final    Comment:        The GeneXpert MRSA Assay (FDA approved for NASAL specimens only), is one component of a comprehensive MRSA colonization surveillance program. It is not intended to diagnose MRSA infection nor to guide or monitor treatment for MRSA infections.      Liver Function Tests:  Recent Labs Lab 12/01/15 1233  AST 40  ALT 22  ALKPHOS 43  BILITOT 1.1  PROT 7.8  ALBUMIN 3.5    Cardiac Enzymes: No results for input(s): CKTOTAL, CKMB, CKMBINDEX, TROPONINI in the last 168 hours. BNP (last 3 results)  Recent Labs  11/25/15 1110 12/01/15 1233  BNP 1,953.5* >4,500.0*    ProBNP (last 3 results) No results for input(s): PROBNP in the last 8760 hours.    Studies: No results found.  Scheduled Meds: . aspirin EC  81 mg Oral Daily  . chlorhexidine  15 mL Mouth Rinse BID  . furosemide  40 mg Intravenous BID  . heparin  5,000 Units Subcutaneous Q8H  . [START ON  12/05/2015] Influenza vac split quadrivalent PF  0.5 mL Intramuscular Tomorrow-1000  . levofloxacin  500 mg Oral Q24H  . mouth rinse  15 mL Mouth Rinse q12n4p  . sodium chloride flush  3 mL Intravenous Q12H    Continuous Infusions:   Time spent: 25 min  [facility],Shantoya Geurts S   [facility] Pager [phone]. If 7PM-7AM, please contact night-coverage at [email or web address], Office  [phone]  password [email or web address] 12/04/2015, 6:34 PM  LOS: 2 days

## 2015-12-04 NOTE — Evaluation (Signed)
Clinical/Bedside Swallow Evaluation Patient Details  Name: Rebecca [name] MRN: [id number] Date of Birth: 04/25/1945  Today's Date: 12/04/2015 Time: SLP Start Time (ACUTE ONLY): 1635 SLP Stop Time (ACUTE ONLY): 1655 SLP Time Calculation (min) (ACUTE ONLY): 20 min  Past Medical History:  Past Medical History:  Diagnosis Date  . Atrial fibrillation (HCC) 05/01/2013  . Depression   . Diabetes mellitus without complication (HCC)   . Expressive aphasia 03/23/2013  . 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 Fabry's disease (HCC)   . Hypertension   . Stroke (HCC)    2015  . Stroke [facility](HCC) 2016   aphasia  . Tobacco abuse 05/01/2013   Past Surgical History:  Past Surgical History:  Procedure Laterality Date  . CESAREAN SECTION     x3  . hip replacement Right 1990's  . hip replacemet Left 1990's  . LAPAROSCOPIC GASTROSTOMY N/A 04/20/2014   Procedure: LAPAROSCOPIC GASTROSTOMY TUBE PLACEMENT;  Surgeon: Axel FillerArmando Ramirez, MD;  Location: [facility] OR;  Service: General;  Laterality: N/A;  . PEG tube placemnt  04/15/14  . TEE WITHOUT CARDIOVERSION N/A 03/26/2013   Procedure: TRANSESOPHAGEAL ECHOCARDIOGRAM (TEE);  Surgeon: Thurmon FairMihai Croitoru, MD;  [facility];  Service: Cardiovascular;  Laterality: N/A;  . TOOTH EXTRACTION Right 1/17   lower canine   HPI:  Pt is a 70 y.o. female with PMH of Rebecca E Williamsis a 70 y.o.with history of combined systolic and diastolic heart failure with last EF of 55%, paroxysmal atrial fibrillation, CVA, hypertension. Pt admitted 9/7 with acute mid status change and shortness of breath. Pt was found to have a likely pneumonia on chest x-ray which was being treated during last admission and pulmonary congestion. MRI 9/8 showed 3 or 4 punctate acute infarcts in L MCA territory consistent with microembolic infarctions. Pt was previously followed by SLP on 9/8 with recommendation for dysphagia 1/ nectar-thick liquids. Pt was discharged 9/12 and re-admitted 9/13 with acute  mental status change and SOB, found to have a likely PNA and pulmonary congestion.  Current diet is dysphagia 1/ thin liquids. Bedside swallow eval re-ordered.   Assessment / Plan / Recommendation Clinical Impression  Pt showed no overt s/s of aspiration at bedside; however, suspect that the pt's swallow initiation timing is inconsistent, sometimes with hyolaryngeal movement immediately and other times a 2-3 second delay. Pt was minimally verbal, answering some yes/ no questions and able to repeat name after model, not always able to follow 1-step commands. Given these findings, recommend continuing dysphagia 1 diet and downgrading to nectar-thick liquids, meds crushed in puree, full supervision for feeding and cue small bites/ sips. Will continue to follow for diet tolerance.    Aspiration Risk  Mild aspiration risk;Moderate aspiration risk    Diet Recommendation Dysphagia 1 (Puree);Nectar-thick liquid   Liquid Administration via: Straw Medication Administration: Crushed with puree Supervision: Staff to assist with self feeding;Full supervision/cueing for compensatory strategies Compensations: Slow rate;Small sips/bites;Minimize environmental distractions Postural Changes: Seated upright at 90 degrees    Other  Recommendations Oral Care Recommendations: Oral care BID Other Recommendations: Order thickener from pharmacy;Prohibited food (jello, ice cream, thin soups);Remove water pitcher   Follow up Recommendations Skilled Nursing facility      Frequency and Duration min 1 x/week  1 week       Prognosis Prognosis for Safe Diet Advancement: Fair Barriers to Reach Goals: Cognitive deficits;Other (Comment) (previous level of function)      Swallow Study   General HPI: Pt is a 70 y.o. female with PMH  of Rebecca [name] a 70 y.o.with history of combined systolic and diastolic heart failure with last EF of 55%, paroxysmal atrial fibrillation, CVA, hypertension. Pt admitted 9/7  with acute mid status change and shortness of breath. Pt was found to have a likely pneumonia on chest x-ray which was being treated during last admission and pulmonary congestion. MRI 9/8 showed 3 or 4 punctate acute infarcts in L MCA territory consistent with microembolic infarctions. Pt was previously followed by SLP on 9/8 with recommendation for dysphagia 1/ nectar-thick liquids. Pt was discharged 9/12 and re-admitted 9/13 with acute mental status change and SOB, found to have a likely PNA and pulmonary congestion.  Current diet is dysphagia 1/ thin liquids. Bedside swallow eval re-ordered. Type of Study: Bedside Swallow Evaluation Previous Swallow Assessment: see HPI Diet Prior to this Study: Dysphagia 1 (puree);Thin liquids Temperature Spikes Noted: Yes Respiratory Status: Nasal cannula History of Recent Intubation: No Behavior/Cognition: Alert;Cooperative;Requires cueing;Doesn't follow directions Oral Cavity Assessment: Dry Oral Care Completed by SLP: Yes Oral Cavity - Dentition: Missing dentition;Poor condition Self-Feeding Abilities: Total assist Patient Positioning: Upright in bed Baseline Vocal Quality: Breathy Volitional Cough: Cognitively unable to elicit Volitional Swallow: Unable to elicit    Oral/Motor/Sensory Function Overall Oral Motor/Sensory Function: Other (comment) (difficult to assess- pt not following commands)   Ice Chips Ice chips: Not tested   Thin Liquid Thin Liquid: Impaired Presentation: Straw Pharyngeal  Phase Impairments: Suspected delayed Swallow    Nectar Thick Nectar Thick Liquid: Impaired Presentation: Straw Pharyngeal Phase Impairments: Suspected delayed Swallow   Honey Thick Honey Thick Liquid: Not tested   Puree Puree: Impaired Presentation: Spoon Pharyngeal Phase Impairments: Suspected delayed Swallow   Solid   GO   Solid: Not tested        Metro Kung, MA, CCC-SLP 12/04/2015,5:02 PM [phone]

## 2015-12-05 ENCOUNTER — Encounter: Payer: Self-pay | Admitting: Internal Medicine

## 2015-12-05 ENCOUNTER — Inpatient Hospital Stay (HOSPITAL_COMMUNITY): Payer: Medicare Other

## 2015-12-05 DIAGNOSIS — G9340 Encephalopathy, unspecified: Secondary | ICD-10-CM

## 2015-12-05 LAB — COMPREHENSIVE METABOLIC PANEL
ALT: 27 U/L (ref 14–54)
ANION GAP: 11 (ref 5–15)
AST: 56 U/L — ABNORMAL HIGH (ref 15–41)
Albumin: 3.5 g/dL (ref 3.5–5.0)
Alkaline Phosphatase: 39 U/L (ref 38–126)
BUN: 50 mg/dL — AB (ref 6–20)
CHLORIDE: 111 mmol/L (ref 101–111)
CO2: 25 mmol/L (ref 22–32)
Calcium: 9.6 mg/dL (ref 8.9–10.3)
Creatinine, Ser: 1.5 mg/dL — ABNORMAL HIGH (ref 0.44–1.00)
GFR calc non Af Amer: 34 mL/min — ABNORMAL LOW (ref >60)
GFR, EST AFRICAN AMERICAN: 39 mL/min — AB (ref >60)
Glucose, Bld: 130 mg/dL — ABNORMAL HIGH (ref 65–99)
Potassium: 4.8 mmol/L (ref 3.5–5.1)
SODIUM: 147 mmol/L — AB (ref 135–145)
Total Bilirubin: 0.9 mg/dL (ref 0.3–1.2)
Total Protein: 8.5 g/dL — ABNORMAL HIGH (ref 6.5–8.1)

## 2015-12-05 LAB — LACTIC ACID, PLASMA: LACTIC ACID, VENOUS: 2.8 mmol/L — AB (ref 0.5–1.9)

## 2015-12-05 LAB — CBC
HCT: 36.8 % (ref 36.0–46.0)
Hemoglobin: 11.5 g/dL — ABNORMAL LOW (ref 12.0–15.0)
MCH: 25.1 pg — AB (ref 26.0–34.0)
MCHC: 31.3 g/dL (ref 30.0–36.0)
MCV: 80.3 fL (ref 78.0–100.0)
PLATELETS: 183 10*3/uL (ref 150–400)
RBC: 4.58 MIL/uL (ref 3.87–5.11)
RDW: 19.4 % — ABNORMAL HIGH (ref 11.5–15.5)
WBC: 4.5 10*3/uL (ref 4.0–10.5)

## 2015-12-05 LAB — GLUCOSE, CAPILLARY
GLUCOSE-CAPILLARY: 107 mg/dL — AB (ref 65–99)
Glucose-Capillary: 128 mg/dL — ABNORMAL HIGH (ref 65–99)

## 2015-12-05 MED ORDER — LEVOFLOXACIN IN D5W 750 MG/150ML IV SOLN
750.0000 mg | INTRAVENOUS | Status: DC
  Administered 2015-12-05 – 2015-12-07 (×2): 750 mg via INTRAVENOUS
  Filled 2015-12-05 (×2): qty 150

## 2015-12-05 MED ORDER — LEVOFLOXACIN 750 MG PO TABS: 750.0000 mg | ORAL_TABLET | ORAL | Status: DC

## 2015-12-05 NOTE — Progress Notes (Signed)
Pharmacy Antibiotic Note  Rebecca [name] is a 70 y.o. female admitted on 12/01/2015 with AMS and SOB.  Pharmacy consulted for renal dose adjusted of antibiotics.   HCAP with Acute respiratory failure with hypoxemia:  Afeb, last SCr 0.93 done 9/13 , CrCl ~48 ml/min MD  transitioned IV antibiotics to to oral Levaquin 500mg  po q24h on 12/03/15.  For CrCl < 50 ml/min the dose should be reduced to 750 mg po q48 hours.   Plan: Changed oral levofloxacin to 750 mg q48h  Temp (24hrs), Avg:98.1 F (36.7 C), Min:97.8 F (36.6 C), Max:98.7 F (37.1 C)   Recent Labs Lab 11/29/15 0458 11/30/15 0544 12/01/15 1151 12/01/15 1206 12/01/15 1233 12/01/15 1735 12/05/15 1101  WBC 4.2 3.8*  --   --   --   --  4.5  CREATININE 1.03* 0.95 1.00  --  0.93  --   --   LATICACIDVEN  --   --   --  3.07*  --  1.76  --     Estimated Creatinine Clearance: 47.9 mL/min (by C-G formula based on SCr of 0.93 mg/dL).    Allergies  Allergen Reactions  . Lisinopril Swelling    Antimicrobials this admission: Cefepime 9/12>>  Vanc 9/12>>   Dose adjustments this admission: N/A  Microbiology results: none sent  Thank you for allowing pharmacy to be a part of this patient's care.  Noah Delaine, RPh Clinical Pharmacist Pager: [phone] 12/05/2015 11:31 AM

## 2015-12-05 NOTE — Progress Notes (Signed)
This is an acute visit.  Level care skilled.  Facility is [facility]  Chief complaint-acute visit secondary to hypoxia-altered mental status-.  History of present illness.  Patient is a 70 year old female with a complex medical history including a recent hospitalization for altered mental status-acute encephalopathy.  This was thought to be multi factor oh including acute infarcts noted on MRI as well as changes related to healthcare associated pneumonia versus aspiration pneumonia.  MI R showed scattered microembolic infarctions no large infarction. She was continued on L christen baby aspirin and followed by neurology.  In regards to the ammonia was treated with IV antibiotics.  Patient had continued decline in palliative care was consulted and it was consideration for comfort measures however this has not been fully implemented here at the facility yet since she has just recently returned from the hospital.  She also has a history of chronic systolic and diastolic CHF again with what appears to be significant failure to thrive.  This morning patient developed altered mental status apparently was somewhat less alert-she was found to be hypoxic with O2 saturations in the 80s-this did respond somewhat to oxygen but still appear to have some labored breathing when I saw her-and in fact this appeared to progress in a fairly quick fashion when I was evaluating her.  Nursing staff has not really been able to obtain her blood pressure this is very difficult to auscultate she appears to again be declining significantly here.  She is at times tachycardic with a pulse somewhat over 100 I got 100 before EMS arrived. --She did have an episode of V. tach in the hospital and orders for metoprolol apparently if she will take it although I suspect this is been a challenge with her declining status  Past Medical History:  Diagnosis Date  . Atrial fibrillation (HCC) 05/01/2013  .  Depression   . Diabetes mellitus without complication (HCC)   . Expressive aphasia 03/23/2013  . 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 Fabry's disease (HCC)   . Hypertension   . Stroke (HCC)    2015  . Stroke [facility](HCC) 2016   aphasia  . Tobacco abuse 05/01/2013         Past Surgical History:  Procedure Laterality Date  . CESAREAN SECTION     x3  . hip replacement Right 1990's  . hip replacemet Left 1990's  . LAPAROSCOPIC GASTROSTOMY N/A 04/20/2014   Procedure: LAPAROSCOPIC GASTROSTOMY TUBE PLACEMENT;  Surgeon: Axel FillerArmando Ramirez, MD;  Location: [facility] OR;  Service: General;  Laterality: N/A;  . PEG tube placemnt  04/15/14  . TEE WITHOUT CARDIOVERSION N/A 03/26/2013   Procedure: TRANSESOPHAGEAL ECHOCARDIOGRAM (TEE);  Surgeon: Thurmon FairMihai Croitoru, MD;  [facility];  Service: Cardiovascular;  Laterality: N/A;  . TOOTH EXTRACTION Right 1/17   lower canine    Social History        Social History  . Marital status: Single    Spouse name: N/A  . Number of children: N/A  . Years of education: N/A   Occupational History  . Not on file.       Social History Main Topics  . Smoking status: Never Smoker  . Smokeless tobacco: Never Used  . Alcohol use No  . Drug use: No  . Sexual activity: Not Currently       Other Topics Concern  . Not on file      Social History Narrative  . No narrative on file  Is a resident [facility] nursing  facility      Allergies  Allergen Reactions  . Lisinopril Swelling         Family History  Problem Relation Age of Onset  . Alcohol abuse Maternal Aunt      Medications.  Tylenol 650 mg every 6 hours when necessary.  Aspirin 81 mg daily.  Ativan 1 mg every 8 hours when necessary.  Eliquist 5 mg twice a day.  Lopressor 12.5 mg twice a day.  Morphine sulfate 5 mg under tongue every 2 hours when necessary pain or shortness of breath.  Remeron 7.5 mg daily.  Zoloft 75 mg daily.  Review of systems is  essentially unattainable secondary to dementia and patient's altered mental status.  Physical exam.  She appears to be afebrile pulse is averaging slightly over 100 respirations of 12 blood pressures difficult to obtain O2 saturation is been on the 80s has risen bordering on 90 with aggressive oxygen supplementation.  In general this is an extremely frail elderly female she appears to be in somewhat moderate discomfort with somewhat irregular breathing.  Her skin is warm and dry.  Eyes pupils appear reactive she does not really make eye contact.  Heart is regular irregular at times tachycardic this varies slightly over 100 at times. She has a 2/6 systolic murmur with 1+ lower extremity edema  She does not have significant lower extremity edema.  Chest difficult exam since patient does not really follow verbal commands appears to have some diffuse congestion however with some labored--irregular breathing.  Abdomen is soft does not appear acutely tender although this is difficult to fully tell bowel sounds are positive.  Musculoskeletal has extreme frailty and all extremities does not really move extremities on command does have a history of CVA  Neurologic cannot really do a adequate exam secondary to patient not really responding to any verbal commands does appear to respond to painful stimuli however.  Psych again appears to have significant dementia altered mental status is not really talking or following any verbal commands   Labs.  11/30/2015.  WBC 3.8 hemoglobin 9.3 platelets 161.  Sodium 143 potassium 4.3 BUN 29 creatinine 0.95.  Assessment plan.  #1 hypoxia and altered mental status with labored breathing-concern here for declining status studies progressing fairly significantly-there were discussions about comfort care in the hospital and apparently hospice consult is pending-nursing staff has spoken with her son this morning however and he would like at this point  aggressive measures so will send her to the ER for expedient evaluation-clinically she appears to be significantly declining and at risk [place] suspect for continued decline With her diagnoses of failurer to thrive-recent CVA-pneumonia-as well as congestive heart failure   [id number]

## 2015-12-05 NOTE — Progress Notes (Signed)
[facility]  PROGRESS NOTE  Rebecca CarwinConstance E Kotz ZOX:[id number] DOB: 06/12/1945 DOA: 12/01/2015 PCP: Merrilee SeashoreAnne Alexander, MD   Brief HPI:  70 y.o. with history of combined systolic and diastolic heart failure with last EF of 55%, paroxysmal atrial fibrillation, CVA, hypertension. She presented with altered mental status and shortness of breath. She was found to have a likely pneumonia on chest x-ray which probably hasnt resolved from last admission.   Change in  Mental status on 9/17, a repeat CT head ordered, followed by MRI brain.  Requested neurology assistance.     Subjective    Patient seen and examined, nonverbal. Right sided gaze. Spasticity in the right upper extremity.    Assessment/Plan:     1. Acute hypoxic respiratory failure-multifactorial, likely from healthcare associated pneumonia and CHF. Patient given Lasix, IV antibiotics vancomycin and cefepime have been discontinued and patient started on Levaquin. Currently on [place] oxygen, with good oxygen saturations. Able to  Maintain airway.  2. Acute change in mental status from yesterday :  H/o recent punctate strokes. stat MRI brain ordered, and neurology consulted. As per the son pt has had 2 to3 seizure activities at the SNF before admission, none reported on admission or during the hospitalization. Ordered EEG too. Unclear if she had another CVA vs metabolic encephalopathy. Will get stat labs and imaging. She was on eliquis and aspirin on discharge.  3. Anxiety- continue when necessary Ativan 4. Hypertension- well controlled.  5. Hypernatremia: get stat labs to check a repeat sodium today.  6. Anemia: anemia of chronic disease. Repeat labs today.  7. H/p Paroxysmal atrial fibrillation: currently NSR. eliquis was discontinued on 9/14. 8. H/o acute on chronic systolic CHF:  On IV lasix 40 mg BID. bnp added.    Goals of care-palliative care was consulted, patient was changed from comfort care to DO NOT RESUSCITATE. Palliative  care has been fired by patient's daughter. As per my discussion with patient's son, patient now FULL CODE. He wants her to go back to skilled nursing facility with hospice if possible.      DVT prophylaxis: Heparin  Code Status: Full code,   Family Communication: Discussed with patient's son at bedside and updated.  Disposition Plan: Skilled nursing facility   Consultants:  Palliative care  Neurology.   Procedures:  None  Antibiotics:   Anti-infectives    Start     Dose/Rate Route Frequency Ordered Stop   12/04/15 2000  levofloxacin (LEVAQUIN) tablet 500 mg     500 mg Oral Every 24 hours 12/03/15 1959     12/03/15 2100  levofloxacin (LEVAQUIN) IVPB 500 mg     500 mg 100 mL/hr over 60 Minutes Intravenous  Once 12/03/15 1959 12/03/15 2318   12/03/15 1915  levofloxacin (LEVAQUIN) tablet 500 mg  Status:  Discontinued     500 mg Oral Every 24 hours 12/03/15 1857 12/03/15 1959   12/02/15 0800  vancomycin (VANCOCIN) 500 mg in sodium chloride 0.9 % 100 mL IVPB  Status:  Discontinued     500 mg 100 mL/hr over 60 Minutes Intravenous Every 12 hours 12/01/15 1912 12/01/15 1932   12/02/15 0800  vancomycin (VANCOCIN) 500 mg in sodium chloride 0.9 % 100 mL IVPB  Status:  Discontinued     500 mg 100 mL/hr over 60 Minutes Intravenous Every 12 hours 12/01/15 1932 12/03/15 1857   12/01/15 1900  vancomycin (VANCOCIN) IVPB 1000 mg/200 mL premix     1,000 mg 200 mL/hr over 60 Minutes Intravenous  Once  12/01/15 1848 12/01/15 2047   12/01/15 1845  ceFEPIme (MAXIPIME) 2 g in dextrose 5 % 50 mL IVPB  Status:  Discontinued     2 g 100 mL/hr over 30 Minutes Intravenous Every 24 hours 12/01/15 1826 12/03/15 1857       Objective   Vitals:   12/04/15 1744 12/04/15 2041 12/05/15 0457 12/05/15 0711  BP: 111/70 128/90 111/86 130/89  Pulse: (!) 56 81 70 85  Resp: 16 18 19    Temp: 98.7 F (37.1 C) 97.8 F (36.6 C) 97.8 F (36.6 C) 98.1 F (36.7 C)  TempSrc: Oral Oral Axillary Axillary   SpO2: 100% 100% 100% 100%  Weight:      Height:        Intake/Output Summary (Last 24 hours) at 12/05/15 0807 Last data filed at 12/05/15 0457  Gross per 24 hour  Intake               60 ml  Output              600 ml  Net             -540 ml   Filed Weights   12/01/15 2035 12/03/15 2044  Weight: 61 kg (134 lb 7.7 oz) 59.9 kg (132 lb 0.9 oz)     Physical Examination:  General exam: lethargic, non verbal, responding to pain.  Respiratory system: Clear to auscultation. Respiratory effort normal. Cardiovascular system:  RRR. No  murmurs, rubs, gallops. No pedal edema. GI system: Abdomen is nondistended, non tender. Bowel sound heard.  Central nervous system. Letharic, non verbal. Right sided gaze, spasticity in the right upper extremity. Weakness in the left upper extremity. Gait could not be tested as she is mostly bed bound. Not oriented.  Skin: No rashes, lesions or ulcers.     Data Reviewed: I have personally reviewed following labs and imaging studies  CBG:  Recent Labs Lab 12/02/15 1157 12/02/15 1719 12/02/15 2132 12/04/15 2032 12/05/15 0718  GLUCAP 115* 89 105* 86 128*    CBC:  Recent Labs Lab 11/29/15 0458 11/30/15 0544 12/01/15 1151  WBC 4.2 3.8*  --   HGB 8.9* 9.3* 11.2*  HCT 28.2* 30.5* 33.0*  MCV 80.1 80.7  --   PLT 190 161  --     Basic Metabolic Panel:  Recent Labs Lab 11/29/15 0458 11/30/15 0544 12/01/15 1151 12/01/15 1233  NA 143 143 150* 148*  K 4.4 4.3 4.8 4.5  CL 114* 114* 115* 116*  CO2 19* 18*  --  20*  GLUCOSE 99 109* 121* 107*  BUN 21* 29* 52* 35*  CREATININE 1.03* 0.95 1.00 0.93  CALCIUM 9.2 9.2  --  9.5  MG 1.9  --   --   --     Recent Results (from the past 240 hour(s))  Culture, blood (Routine X 2) w Reflex to ID Panel     Status: Abnormal   Collection Time: 11/25/15 12:05 PM  Result Value Ref Range Status   Specimen Description BLOOD RIGHT ANTECUBITAL  Final   Special Requests IN PEDIATRIC BOTTLE  1CC  Final    Culture  Setup Time   Final    GRAM POSITIVE COCCI IN CLUSTERS IN PEDIATRIC BOTTLE CRITICAL RESULT CALLED TO, READ BACK BY AND VERIFIED WITH: N. Batchelder Pharm.D. 14:55 11/26/15 ([email or web address])    Culture (A)  Final    STAPHYLOCOCCUS SPECIES (COAGULASE NEGATIVE) THE SIGNIFICANCE OF ISOLATING THIS ORGANISM FROM A SINGLE SET OF BLOOD CULTURES WHEN  MULTIPLE SETS ARE DRAWN IS UNCERTAIN. PLEASE NOTIFY THE MICROBIOLOGY DEPARTMENT WITHIN ONE WEEK IF SPECIATION AND SENSITIVITIES ARE REQUIRED.    Report Status 11/28/2015 FINAL  Final  Culture, blood (Routine X 2) w Reflex to ID Panel     Status: None   Collection Time: 11/25/15 12:28 PM  Result Value Ref Range Status   Specimen Description BLOOD RIGHT HAND  Final   Special Requests IN PEDIATRIC BOTTLE  1CC  Final   Culture NO GROWTH 5 DAYS  Final   Report Status 11/30/2015 FINAL  Final  Urine culture     Status: None   Collection Time: 11/25/15 12:43 PM  Result Value Ref Range Status   Specimen Description URINE, CATHETERIZED  Final   Special Requests NONE  Final   Culture NO GROWTH  Final   Report Status 11/26/2015 FINAL  Final  Gram stain     Status: None   Collection Time: 11/25/15 12:43 PM  Result Value Ref Range Status   Specimen Description URINE, CATHETERIZED  Final   Special Requests NONE  Final   Gram Stain   Final    WBC PRESENT,BOTH PMN AND MONONUCLEAR GRAM POSITIVE RODS GRAM POSITIVE COCCI IN CLUSTERS GRAM NEGATIVE RODS CYTOSPIN SMEAR    Report Status 11/25/2015 FINAL  Final  MRSA PCR Screening     Status: None   Collection Time: 11/25/15  2:52 PM  Result Value Ref Range Status   MRSA by PCR NEGATIVE NEGATIVE Final    Comment:        The GeneXpert MRSA Assay (FDA approved for NASAL specimens only), is one component of a comprehensive MRSA colonization surveillance program. It is not intended to diagnose MRSA infection nor to guide or monitor treatment for MRSA infections.      Liver Function Tests:  Recent  Labs Lab 12/01/15 1233  AST 40  ALT 22  ALKPHOS 43  BILITOT 1.1  PROT 7.8  ALBUMIN 3.5    Cardiac Enzymes: No results for input(s): CKTOTAL, CKMB, CKMBINDEX, TROPONINI in the last 168 hours. BNP (last 3 results)  Recent Labs  11/25/15 1110 12/01/15 1233  BNP 1,953.5* >4,500.0*    ProBNP (last 3 results) No results for input(s): PROBNP in the last 8760 hours.    Studies: No results found.  Scheduled Meds: . aspirin EC  81 mg Oral Daily  . chlorhexidine  15 mL Mouth Rinse BID  . furosemide  40 mg Intravenous BID  . heparin  5,000 Units Subcutaneous Q8H  . Influenza vac split quadrivalent PF  0.5 mL Intramuscular Tomorrow-1000  . levofloxacin  500 mg Oral Q24H  . mouth rinse  15 mL Mouth Rinse q12n4p  . sodium chloride flush  3 mL Intravenous Q12H    Continuous Infusions:   Time spent: 25 min  Jered Heiny   [facility] Pager [phone] If 7PM-7AM, please contact night-coverage at [email or web address], Office  [phone]  password [email or web address] 12/05/2015, 8:07 AM  LOS: 3 days

## 2015-12-05 NOTE — Progress Notes (Signed)
Notified Dr Blake Divine re: heart rhythm is SR 2 degree type 2 VSS. Barbera Setters RN

## 2015-12-05 NOTE — Progress Notes (Signed)
Came in to give patient's heparin shot. Noted patient not as responsive as she was. Would not follow command or even make eye contact. Patient also noted to have apnea for 5-10 seconds and have a fix gaze. She was  leaning more to her RT side. Son at bedside and witnessed the event.  MD made aware RRRN paged.   RRRN at beside momentarily.  Vitals remained stable as well as CBG.  MD paged again to come see the patient.

## 2015-12-05 NOTE — Significant Event (Addendum)
Rapid Response Event Note  Overview: Time Called: 0702 Arrival Time: 0702 Event Type: Neurologic  Initial Focused Assessment: Called by bedside RN to assess patient.  Per bedside RN, patient had a change in neurological exam and a fixed gaze.  Patient admitted from SNF for acute mental status changes, SOB, and PNA work up.  Patient was recently admitted for CVA and on MRI and CTs done on 9/8 and 9/9, which were + for 3-4 L MCA infarcts. Per notes, son, and bedside RNs, patient is baseline nonverbal and intermittently follows commands, her weaker side is her RT side. Upon assessment, patient did appear to have R sided Gaze preference, + cough (very weak), + gag, + CR, pupils reactive, round, brisk, and equal. Patient not follow any commands, patient did respond to pain in RUE/RLE but did not in LUE/LLE (which is the stronger side).  VS stable, SBP in the 120s-140s, skin was warm, + Pulses, lung sounds were diminished, currently patient is protecting her airway.  Per nursing the few seconds of apnea that the patient experiences is baseline for her according to the son that is present. Patient is not in respiratory distress and current hemodynamically is stable. Dr. Blake Divine at bedside. NIH done, per patient is 26.   Interventions: -- NS started at 100cc/hr -- HOB flat -- Patient taken to STAT HEAD CT  Plan of Care (if not transferred): Patient returned to 6E per [facility] MD, bedside RN informed that CT done, will follow up with results. - Son went home to get rest, DNR was resended by daughter and patient is Full Code.  - called bedside to see if there was change in patient's status, none per RN, CT and MRI + new R MCA infarct.  Event Summary: Name of Physician Notified: Dr. Blake Divine by Primary RN at [phone]    at    Outcome: Stayed in room and stabalized  Event End Time: 0805  Windy Carina

## 2015-12-05 NOTE — Consult Note (Addendum)
Neurology Consultation Reason for Consult: Ischemic infarct Referring Physician: Blake Divine, V  CC: Decreased responsiveness  History is obtained from: Chart  HPI: Rebecca [name] is a 70 y.o. female with a recent history of small punctate infarcts in left MCA distribution. She was admitted on September 8, then discharged on September 12. She was readmitted on September 13 with hospital-acquired pneumonia. She apparently was lying minimally verbal at baseline, would occasionally obey commands. During her hospitalization, she was seen by the palliative care team. She was switched to comfort only care after conversations with the patient's son who is also her caregiver, but then subsequently apparently she was made full code again before aggressive care. It was noticed last night that she was not quite as interactive as normal and a CT head was performed this morning. This shows a developing moderate to large MCA territory infarction.  There has been some concern for seizures, but I do not see this represented in the chart. A routine EEG could be reasonable but I do not think her events of the past 24 hours or due to seizure.   ROS: A 14 point ROS was performed and is negative except as noted in the HPI.   Past Medical History:  Diagnosis Date  . Atrial fibrillation (HCC) 05/01/2013  . Depression   . Diabetes mellitus without complication (HCC)   . Expressive aphasia 03/23/2013  . 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 Fabry's disease (HCC)   . Hypertension   . Stroke (HCC)    2015  . Stroke [name]) 2016   aphasia  . Tobacco abuse 05/01/2013     Family History  Problem Relation Age of Onset  . Alcohol abuse Maternal Aunt      Social History:  reports that she has never smoked. She has never used smokeless tobacco. She reports that she does not drink alcohol or use drugs.   Exam: Current vital signs: BP (!) 114/57   Pulse 75   Temp 97.9 F (36.6 C) (Oral)   Resp 16   Ht 5\' 4"  (1.626  m)   Wt 59.9 kg (132 lb 0.9 oz)   SpO2 100%   BMI 22.67 kg/m  Vital signs in last 24 hours: Temp:  [97.8 F (36.6 C)-98.7 F (37.1 C)] 97.9 F (36.6 C) (09/17 1105) Pulse Rate:  [56-85] 75 (09/17 0723) Resp:  [16-19] 16 (09/17 1105) BP: (111-144)/(57-95) 114/57 (09/17 1105) SpO2:  [100 %] 100 % (09/17 1105)   Physical Exam  Constitutional: Appears Elderly Psych: Affect appropriate to situation Eyes: No scleral injection HENT: No OP obstrucion Head: Normocephalic.  Cardiovascular: Normal rate and regular rhythm.  Respiratory: Effort normal and breath sounds normal to anterior ascultation GI: Soft.  No distension. There is no tenderness.  Skin: WDI  Neuro: Mental Status: Patient opens eyes to voice, but does not fixate or track. She does not follow commands, does not speak. Cranial Nerves: II: She blinks to threat from the right but not left Pupils are equal, round, and reactive to light.   III,IV, VI: She has a right gaze deviation V:VII: Blinks to eyelid simulation bilaterally Motor: She has increased tone on the right, decreased tone on the left. She flexes to noxious stimuli in all 4 extremities  Sensory: As above  Cerebellar: Patient does not perform   I have reviewed labs in epic and the results pertinent to this consultation are: CMP- Creatinine  I have reviewed the images obtained: CT head-hypodensity in the MCA distribution  Impression: 70 year old female with new large right MCA stroke in the setting of previous left MCA stroke. She now has a fairly dense hemiparesis on both sides, and I suspect her cognition is affected as well. Given that she is on the cusp of full comfort care prior to this event, I think that this represents a considerable down step in expected quality of life.  Recommendations: 1) MRI brain initially recommended, available at the time of finalizing this report 2) I do not think a full stroke workup needs to be repeated given her recent  admission for stroke. 3) if aggressive care is desired, then would proceed PT, OT, ST  4) would hold anticoagulation for now, could use ASA 81 mg daily 5) stroke team to follow if full comfort care is not pursued, otherwise neurology to sign off but would be available as needed.    Ritta SlotMcNeill Kerrington Greenhalgh, MD [facility] [phone]  If 7pm- 7am, please page neurology on call as listed in AMION.

## 2015-12-06 DIAGNOSIS — I6330 Cerebral infarction due to thrombosis of unspecified cerebral artery: Secondary | ICD-10-CM | POA: Diagnosis not present

## 2015-12-06 DIAGNOSIS — E43 Unspecified severe protein-calorie malnutrition: Secondary | ICD-10-CM | POA: Diagnosis present

## 2015-12-06 LAB — GLUCOSE, CAPILLARY
GLUCOSE-CAPILLARY: 106 mg/dL — AB (ref 65–99)
Glucose-Capillary: 109 mg/dL — ABNORMAL HIGH (ref 65–99)
Glucose-Capillary: 118 mg/dL — ABNORMAL HIGH (ref 65–99)
Glucose-Capillary: 119 mg/dL — ABNORMAL HIGH (ref 65–99)

## 2015-12-06 MED ORDER — KCL IN DEXTROSE-NACL 20-5-0.45 MEQ/L-%-% IV SOLN
INTRAVENOUS | Status: DC
  Administered 2015-12-06 – 2015-12-07 (×4): via INTRAVENOUS
  Filled 2015-12-06 (×5): qty 1000

## 2015-12-06 NOTE — Evaluation (Signed)
Occupational Therapy Evaluation Patient Details Name: Rebecca [name] MRN: [id number] DOB: 11/10/1945 Today's Date: 12/06/2015    History of Present Illness Pt is a 70 y.o. female with PMH of Rebecca [name] a 70 y.o.with history of combined systolic and diastolic heart failure with last EF of 55%, paroxysmal atrial fibrillation, CVA, hypertension. Pt admitted 9/7 with acute mid status change and shortness of breath. Pt was found to have a likely pneumonia on chest x-ray which was being treated during last admission and pulmonary congestion. MRI 9/8 showed 3 or 4 punctate acute infarcts in L MCA territory consistent with microembolic infarctions. Pt was discharged 9/12 and re-admitted 9/13 with acute mental status change and SOB; Neurologic changes noted 9/17, MRI shows new R MCA infarct.   Clinical Impression   This 70 yo female admitted with above presents to acute OT with deficits below (see OT problem list) thus affecting her ability to help care for herself. She will benefit from a right palm guard and trial to see how she does when she is up in a recliner. Recommend follow up at SNF as they deem appropriate.   Follow Up Recommendations  SNF;Other (comment) (as they deem appropriate)    Equipment Recommendations  Other (comment) (TBD at next venue)       Precautions / Restrictions Precautions Precautions: Fall Precaution Comments: Watch for extensor tone when moving Required Braces or Orthoses: Other Brace/Splint Other Brace/Splint: Therapies are requesting palm guard, resting foot splints Restrictions Weight Bearing Restrictions: No      Mobility Bed Mobility Overal bed mobility: Needs Assistance;+2 for physical assistance Bed Mobility: Rolling;Sidelying to Sit;Sit to Supine Rolling: Total assist;+2 for physical assistance Sidelying to sit: Total assist;+2 for physical assistance   Sit to supine: Total assist;+2 for physical assistance   General bed mobility  comments: Requiring +2 total assist for all aspects of bed mobility; Total assist to elevate trunk to sit with support given at upper trunk and pelvic girdle to attain upright sitting position; 2 person assist to return to sit     Balance Overall balance assessment: Needs assistance Sitting-balance support: No upper extremity supported;Feet supported Sitting balance-Leahy Scale: Zero Sitting balance - Comments: max to total assist to maintain upright sitting EOB; When presented with relatively uncomfortable stimuli (mouth suctioning, wet washcloth to face) noting ramp up of extensor tone, requiring total assist to stablize feet and support back to prevent sliding off of the bed Postural control: Posterior lean (especially when extensor tone sets in)                                  ADL                                         General ADL Comments: total A for all basic ADLs     Vision Additional Comments: Pt only intermittently blinks to threat at left eye, no response to left eye. Both eyes open while sitting EOB          Pertinent Vitals/Pain Pain Assessment: Faces Pain Location: Noted increased extensor tone response to activities on face and mouth (ie" washing her face and suctioning out her mouth) Pain Descriptors / Indicators:  (Incr extensor tone throughout) Pain Intervention(s): Repositioned     Hand Dominance  (unknown and pt unable to  state (tone in RUE))   Extremity/Trunk Assessment Upper Extremity Assessment Upper Extremity Assessment: RUE deficits/detail;LUE deficits/detail RUE Deficits / Details: Increased tone at rest with increased extensor tone with face washing and mouth suctioning RUE Coordination: decreased fine motor;decreased gross motor LUE Deficits / Details: No AROM noted in LUE; PROM WNL LUE Coordination: decreased fine motor;decreased gross motor   Lower Extremity Assessment Lower Extremity Assessment: RLE  deficits/detail;LLE deficits/detail RLE Deficits / Details: Increased tone noted, especally extensor tone with certain stimuli; Difficult to assess ROM and strength due to extensor tone/rigidity and pt not following commands. Noted tending to have R fot and ankle inverted and supinated LLE Deficits / Details: Tone present, but to a lesser extent than R LE; Able to move through foot, ankle, knee and hip PROM freely; no voluntary movement noted   Cervical / Trunk Assessment Cervical / Trunk Assessment: Kyphotic   Communication Communication Communication: Expressive difficulties;Receptive difficulties   Cognition Arousal/Alertness: Awake/alert Behavior During Therapy: Flat affect Overall Cognitive Status: No family/caregiver present to determine baseline cognitive functioning Area of Impairment: Attention;Following commands   Current Attention Level: Focused   Following Commands:  (did not follow commands)       General Comments: Ms. Haagensen maintained eyes open once sitting up on EOB, did not blink to threat left eye, inconsistent blink to threat Right eye; Noted deep breathing response to wiping face with wet washcloth; otherwise minimal responses to auditory and visual stimuli       Exercises Exercises: Other exercises Other Exercises Other Exercises: Performed PROM therex bil LEs to assess tone and response to movement stimulus        Home Living Family/patient expects to be discharged to:: Skilled nursing facility                                 Additional Comments: Plan is to dc to SNF with Hospice Services      Prior Functioning/Environment Level of Independence: Needs assistance  Gait / Transfers Assistance Needed: Used wheelchair     Comments: Above gleaned from chart review        OT Problem List: Impaired UE functional use;Decreased activity tolerance;Impaired balance (sitting and/or standing);Impaired tone   OT Treatment/Interventions:  Self-care/ADL training;Balance training;Splinting;Patient/family education;Therapeutic activities    OT Goals(Current goals can be found in the care plan section) Acute Rehab OT Goals Patient Stated Goal: unable to state OT Goal Formulation: Patient unable to participate in goal setting Time For Goal Achievement: 12-24-2015 Potential to Achieve Goals: Poor  OT Frequency: Min 2X/week   Barriers to D/C: Decreased caregiver support          Co-evaluation PT/OT/SLP Co-Evaluation/Treatment: Yes Reason for Co-Treatment: For patient/therapist safety;Necessary to address cognition/behavior during functional activity PT goals addressed during session: Mobility/safety with mobility;Balance (positioning) OT goals addressed during session: Strengthening/ROM      End of Session Equipment Utilized During Treatment:  (none)  Activity Tolerance: Patient limited by lethargy Patient left: in bed;with call bell/phone within reach;with chair alarm set (pillow under calves, pillow between feet due to internal rotation of both LEs, pillow under RUE and towel under right hand for safe positioning of RUE with tonal issues)   Time: [id number] OT Time Calculation (min): 18 min Charges:  OT General Charges $OT Visit: 1 Procedure OT Evaluation $OT Eval Moderate Complexity: 1 Procedure  Evette Georges [phone] 12/06/2015, 11:08 AM

## 2015-12-06 NOTE — Clinical Social Work Note (Signed)
Ms. Semmler from [facility] facility and per contact with patient's son, Giulia Bermingham ([phone]), the plan is for her to return (full assessment to follow). CSW will continue to follow and faciliteat discharge back to skilled facility when medially stable.  Genelle Bal, MSW, LCSW Licensed Clinical Social Worker Clinical Social Work Department [employer] [phone]

## 2015-12-06 NOTE — Progress Notes (Signed)
Provider on call notified that patient had some runs of V-tach tonight. RN spoke to central tele about concern.

## 2015-12-06 NOTE — Progress Notes (Addendum)
Speech Language Pathology Treatment: Dysphagia  Patient Details Name: Rebecca [name] MRN: [id number] DOB: [birth year] Today's Date: 12/06/2015 Time: 5427-0623 SLP Time Calculation (min) (ACUTE ONLY): 16 min  Assessment / Plan / Recommendation Clinical Impression  Pt seen with PT and OT to maximize arousal at edge of bed. Pt awake with eyes open , but minimal responsive to visual and tactile cues, except for some posturing and increased tone with activities. SLP provided tactile stimulation to relax clenched jaw and encourage lingualand labial movement with a wet swab. Overall, pt is not appropriate for PO intake due to severe impaired awareness and motor response to PO. Will follow for further attempts, but prognosis for safe intake and adequate nutrition appears poor.    HPI HPI: Pt is a 70 y.o. female with PMH of Rebecca Baragan Williamsis a 70 y.o.with history of combined systolic and diastolic heart failure with last EF of 55%, paroxysmal atrial fibrillation, CVA, hypertension. Pt admitted 9/7 with acute mid status change and shortness of breath. Pt was found to have a likely pneumonia on chest x-ray which was being treated during last admission and pulmonary congestion. MRI 9/8 showed 3 or 4 punctate acute infarcts in L MCA territory consistent with microembolic infarctions. Pt was previously followed by SLP on 9/8 with recommendation for dysphagia 1/ nectar-thick liquids. Pt was discharged 9/12 and re-admitted 9/13 with acute mental status change and SOB, found to have a likely PNA and pulmonary congestion.  BSE recommended downgrade from thin to nectar thick liquids, then pt had neuro change, found to have new right CVA.      SLP Plan        Recommendations  Diet recommendations: NPO                        GO               [name], MA CCC-SLP [phone]  [name] 12/06/2015, 10:27 AM

## 2015-12-06 NOTE — Progress Notes (Signed)
Initial Nutrition Assessment  DOCUMENTATION CODES:   Severe malnutrition in context of acute illness/injury  INTERVENTION:  Diet advancement per MD and team as appropriate.   If unable to advance diet, enteral nutrition (if within goals of care)may need to be considered to provide adequate nutrition.  Once nutrition is restarted, monitor magnesium, potassium, and phosphorus daily for at least 3 days, MD to replete as needed, as pt is at risk for refeeding syndrome given severe malnutrition and little to no PO since admission.  RD to continue to monitor.   NUTRITION DIAGNOSIS:   Malnutrition related to acute illness as evidenced by moderate depletion of body fat, moderate depletions of muscle mass, energy intake < or equal to 50% for > or equal to 5 days.  GOAL:   Patient will meet greater than or equal to 90% of their needs  MONITOR:   Diet advancement, Labs, Weight trends, Skin, I & O's  REASON FOR ASSESSMENT:   Low Braden    ASSESSMENT:   70 y.o. female with PMH of Rebecca [name] is a 70 y.o. with history of combined systolic and diastolic heart failure with last EF of 55%, paroxysmal atrial fibrillation, CVA, hypertension. Pt admitted 9/7 with acute mid status change and shortness of breath. Pt was found to have a likely pneumonia on chest x-ray which was being treated during last admission and pulmonary congestion. MRI 9/8 showed 3 or 4 punctate acute infarcts in L MCA territory consistent with microembolic infarctions. Pt was discharged 9/12 and re-admitted 9/13 with acute mental status change and SOB; Neurologic changes noted 9/17, MRI shows new R MCA infarct. Patient's daughter and son request pt is now full code.   Pt is currently NPO. Per SLP,  pt with severe impaired awareness and motor response to PO. No family at bedside. Pt unresponsive to questions asked. RD unable to obtain most recent nutrition history. Noted little to no PO intake since admission. Pt is at  risk for refeeding once nutrition is restarted. If pt unable to have diet advanced, may need consideration of enteral nutrition if within goals of care. Pt with a 12.5% weight loss since admission. RD to continue to monitor.   Nutrition-Focused physical exam completed. Findings are moderate fat depletion, moderate muscle depletion, and no edema.   Labs and medications reviewed. Sodium elevated at 147.   Diet Order:  Diet NPO time specified  Skin:  Reviewed, no issues  Last BM:  9/13  Height:   Ht Readings from Last 1 Encounters:  12/01/15 5\' 4"  (1.626 m)    Weight:   Wt Readings from Last 1 Encounters:  12/06/15 119 lb 7.8 oz (54.2 kg)    Ideal Body Weight:  54.5 kg  BMI:  Body mass index is 20.51 kg/m.  Estimated Nutritional Needs:   Kcal:  1400-1600  Protein:  65-75 grams  Fluid:  >/= 1.5 L/day  EDUCATION NEEDS:   No education needs identified at this time  Roslyn Smiling, MS, RD, LDN Pager # [phone] After hours/ weekend pager # [phone]

## 2015-12-06 NOTE — Evaluation (Signed)
Physical Therapy Evaluation Patient Details Name: Rebecca [name] MRN: [id number] DOB: 13-Sep-1945 Today's Date: 12/06/2015   History of Present Illness  Pt is a 70 y.o. female with PMH of Rebecca Pong Williamsis a 70 y.o.with history of combined systolic and diastolic heart failure with last EF of 55%, paroxysmal atrial fibrillation, CVA, hypertension. Pt admitted 9/7 with acute mid status change and shortness of breath. Pt was found to have a likely pneumonia on chest x-ray which was being treated during last admission and pulmonary congestion. MRI 9/8 showed 3 or 4 punctate acute infarcts in L MCA territory consistent with microembolic infarctions. Pt was discharged 9/12 and re-admitted 9/13 with acute mental status change and SOB; Neurologic changes noted 9/17, MRI shows new R MCA infarct.  Clinical Impression   Pt admitted with above diagnosis. Pt currently with functional limitations due to the deficits listed below (see PT Problem List).   Rebecca [name] is minimally responsive to auditory and visual stimuli; incr tone response to tactile stimuli; maintained eyes opened to stimulus of being in upright seated posture; Noted plan is for SNF with hospice services following; PT is in agreement; While here, will request palm guard for R hand and resting foot splints, as well as possible air mattress overlay and cushion for chair;   Acute PT goals will be focused on safety with OOB transfers and ability to safely stay in chair, and caregiver education.  Pt will benefit from skilled PT to increase their independence and safety with mobility to allow discharge to the venue listed below.       Follow Up Recommendations SNF;Supervision/Assistance - 24 hour    Equipment Recommendations  Wheelchair (measurements PT);Wheelchair cushion (measurements PT);Other (comment) (lift; air-mattress overlay)    Recommendations for Other Services Other (comment) (Noted some Hospice involvement)      Precautions / Restrictions Precautions Precautions: Fall Precaution Comments: Watch for extensor tone when moving Required Braces or Orthoses: Other Brace/Splint Other Brace/Splint: Therapies are requesting palm guard, resting foot splints      Mobility  Bed Mobility Overal bed mobility: Needs Assistance;+2 for physical assistance Bed Mobility: Rolling;Sidelying to Sit;Sit to Supine Rolling: Total assist;+2 for physical assistance Sidelying to sit: Total assist;+2 for physical assistance   Sit to supine: Total assist;+2 for physical assistance   General bed mobility comments: Requiring +2 total assist for all aspects of bed mobility; Total assist to elevate trunk to sit with support given at upper trunk and pelvic girdle to attain upright sitting position; 2 person assist to return to sit  Transfers                    Ambulation/Gait                Stairs            Wheelchair Mobility    Modified Rankin (Stroke Patients Only) Modified Rankin (Stroke Patients Only) Pre-Morbid Rankin Score: Severe disability Modified Rankin: Severe disability     Balance Overall balance assessment: Needs assistance Sitting-balance support: No upper extremity supported;Feet supported Sitting balance-Leahy Scale: Zero Sitting balance - Comments: max to total assist to maintain upright sitting EOB; When presented with relatively uncomfortable stimuli (mouth suctioning, wet washcloth to face) noting ramp up of extensor tone, requiring total assist to stablize feet and support back to prevent sliding off of the bed Postural control: Posterior lean (especially when extensor tone sets in)  Pertinent Vitals/Pain Pain Assessment: Faces Pain Location: Noted increased extensor tone response to activities that were uncomfortable, like suctioning mouth Pain Descriptors / Indicators:  (Incr extensor tone throughout) Pain  Intervention(s): Repositioned    Home Living Family/patient expects to be discharged to:: Skilled nursing facility                 Additional Comments: Plan is to dc to SNF with Hospice Services    Prior Function Level of Independence: Needs assistance   Gait / Transfers Assistance Needed: Used wheelchair     Comments: Above gleaned from chart review     Hand Dominance        Extremity/Trunk Assessment   Upper Extremity Assessment: Defer to OT evaluation           Lower Extremity Assessment: RLE deficits/detail;LLE deficits/detail RLE Deficits / Details: Increased tone noted, especally extensor tone with certain stimuli; Difficult to assess ROM and strength due to extensor tone/rigidity and pt not following commands. Noted tending to have R fot and ankle inverted and supinated LLE Deficits / Details: Tone present, but to a lesser extent than R LE; Able to move through foot, ankle, knee and hip PROM freely; no voluntary movement noted  Cervical / Trunk Assessment: Kyphotic  Communication   Communication: Expressive difficulties;Receptive difficulties  Cognition Arousal/Alertness: Awake/alert Behavior During Therapy: Flat affect Overall Cognitive Status: No family/caregiver present to determine baseline cognitive functioning Area of Impairment: Attention;Following commands   Current Attention Level: Focused   Following Commands:  (Did not follow commands)       General Comments: Rebecca [name] maintained eyes open once sitting up on EOB, did not blink to threat Left eye, inconsistent blink to threat Right eye; Noted deep breathing response to wiping face with wet washcloth; otherwise minimal responses to auditory and visual stimuli     General Comments General comments (skin integrity, edema, etc.): Noting variation of periods of quiet breathing and deep breathing throughout session    Exercises Other Exercises Other Exercises: Performed PROM therex bil LEs to  assess tone and response to movement stimulus   Assessment/Plan    PT Assessment Patient needs continued PT services  PT Problem List Decreased activity tolerance;Decreased balance;Decreased mobility;Decreased strength;Decreased range of motion;Decreased knowledge of use of DME;Decreased cognition;Decreased safety awareness;Decreased knowledge of precautions;Cardiopulmonary status limiting activity;Decreased coordination;Impaired tone          PT Treatment Interventions Functional mobility training;Therapeutic activities;Therapeutic exercise;Balance training;Neuromuscular re-education;Cognitive remediation;Patient/family education;Wheelchair mobility training;Manual techniques    PT Goals (Current goals can be found in the Care Plan section)  Acute Rehab PT Goals Patient Stated Goal: unable to state PT Goal Formulation: Patient unable to participate in goal setting Time For Goal Achievement: 12/20/15 Potential to Achieve Goals: Fair Additional Goals Additional Goal #1: Pt will tolerate mechanical lift for safe transfers OOB Additional Goal #2: Pt will be able to maintain a safe position in recliner to allow for more repertoire of positions of comfort and allow for pressure redistribution    Frequency Min 2X/week (on a trial basis, pending ability to participate)   Barriers to discharge        Co-evaluation PT/OT/SLP Co-Evaluation/Treatment: Yes Reason for Co-Treatment: Necessary to address cognition/behavior during functional activity;For patient/therapist safety PT goals addressed during session: Mobility/safety with mobility;Balance (positioning)         End of Session Equipment Utilized During Treatment: Other (comment) (bed pad) Activity Tolerance: Patient tolerated treatment well Patient left: in bed;with call bell/phone within reach;with bed alarm  set Nurse Communication: Mobility status;Need for lift equipment         Time: [id number] PT Time Calculation (min)  (ACUTE ONLY): 18 min   Charges:   PT Evaluation $PT Eval Moderate Complexity: 1 Procedure     PT G Codes:        Van ClinesGarrigan, Aeneas Longsworth Hamff 12/06/2015, 10:58 AM  Van ClinesHolly Keora Eccleston, PT  Acute Rehabilitation Services Pager [phone] Office [phone]

## 2015-12-06 NOTE — Progress Notes (Signed)
STROKE TEAM PROGRESS NOTE   HISTORY OF PRESENT ILLNESS (per record) Rebecca [name] is a 70 y.o. female with a recent history of small punctate infarcts in left MCA distribution. She was admitted on September 8, then discharged on September 12. She was readmitted on September 13 with hospital-acquired pneumonia. She apparently was minimally verbal at baseline, would occasionally obey commands. During her hospitalization, she was seen by the palliative care team. She was switched to comfort only care after conversations with the patient's son who is also her caregiver, but then subsequently apparently she was made full code again with aggressive care. It was noticed last night that she was not quite as interactive as normal and a CT head was performed this morning. This shows a developing moderate to large MCA territory infarction.  There has been some concern for seizures, but was not represented in the chart. A routine EEG could be reasonable, but it is not thought her events of the past 24 hours are due to seizure.  Patient was not administered IV t-PA secondary to unknown last known well.    SUBJECTIVE (INTERVAL HISTORY) No family present. Patient lying in bed. No change in physical exam since yesterday.    OBJECTIVE Temp:  [98.1 F (36.7 C)-98.2 F (36.8 C)] 98.1 F (36.7 C) (09/18 0942) Pulse Rate:  [75-100] 75 (09/18 0942) Cardiac Rhythm: Heart block (09/17 2351) Resp:  [15-19] 16 (09/18 0942) BP: (117-144)/(79-100) 131/82 (09/18 0942) SpO2:  [96 %-100 %] 100 % (09/18 0942) Weight:  [54.2 kg (119 lb 7.8 oz)] 54.2 kg (119 lb 7.8 oz) (09/18 0129)  CBC:  Recent Labs Lab 11/30/15 0544 12/01/15 1151 12/05/15 1101  WBC 3.8*  --  4.5  HGB 9.3* 11.2* 11.5*  HCT 30.5* 33.0* 36.8  MCV 80.7  --  80.3  PLT 161  --  183    Basic Metabolic Panel:  Recent Labs Lab 12/01/15 1233 12/05/15 1101  NA 148* 147*  K 4.5 4.8  CL 116* 111  CO2 20* 25  GLUCOSE 107* 130*  BUN 35*  50*  CREATININE 0.93 1.50*  CALCIUM 9.5 9.6    Lipid Panel:    Component Value Date/Time   CHOL 133 11/27/2015 0534   TRIG 54 11/27/2015 0534   HDL 52 11/27/2015 0534   CHOLHDL 2.6 11/27/2015 0534   VLDL 11 11/27/2015 0534   LDLCALC 70 11/27/2015 0534   HgbA1c:  Lab Results  Component Value Date   HGBA1C 5.8 (H) 11/27/2015   Urine Drug Screen:    Component Value Date/Time   LABOPIA NONE DETECTED 11/25/2015 1243   COCAINSCRNUR NONE DETECTED 11/25/2015 1243   LABBENZ NONE DETECTED 11/25/2015 1243   AMPHETMU NONE DETECTED 11/25/2015 1243   THCU NONE DETECTED 11/25/2015 1243   LABBARB NONE DETECTED 11/25/2015 1243      IMAGING  Ct Head Wo Contrast  Result Date: 12/05/2015 CLINICAL DATA:  Pt was found unresponsive on the floor of her home time down unknown She has left side weakness EXAM: CT HEAD WITHOUT CONTRAST TECHNIQUE: Contiguous axial images were obtained from the base of the skull through the vertex without intravenous contrast. COMPARISON:  12/01/2015 FINDINGS: Brain: No intracranial hemorrhage. No parenchymal contusion. No midline shift or mass effect. Basilar cisterns are patent. No skull base fracture. No fluid in the paranasal sinuses or mastoid air cells. Orbits are normal. New cortical hypodensity in the RIGHT frontotemporal lobe (image 19, series 201) within the territory of the RIGHT middle cerebral artery. Remote  infarction within the LEFT middle cerebral artery territory again noted. Extensive periventricular subcortical white matter hypodensities unchanged. Atrophy and proportional ventricular dilatation unchanged Vascular: Vascular calcification of the circle Willis vessels. Skull:  No skull fracture Sinuses/Orbits: Paranasal sinuses and mastoid air cells are clear. Orbits are clear. Other: None IMPRESSION: 1. New RIGHT middle cerebral artery territory infarction. 2. Remote LEFT middle cerebral artery territory infarction. 3. Extensive white matter microvascular  disease. These results will be called to the ordering clinician or representative by the Radiologist Assistant, and communication documented in the PACS or zVision Dashboard. Electronically Signed   By: Genevive BiStewart  Edmunds M.D.   On: 12/05/2015 08:35   Mr Brain Wo Contrast  Result Date: 12/05/2015 CLINICAL DATA:  Acute change in mental status, heart failure, shortness of breath, atrial fibrillation, history of seizures. EXAM: MRI HEAD WITHOUT CONTRAST TECHNIQUE: Multiplanar, multiecho pulse sequences of the brain and surrounding structures were obtained without intravenous contrast. COMPARISON:  MRI brain 11/26/2015. FINDINGS: Brain: Since the previous MR, the patient has developed a large area of confluent infarction in the RIGHT frontal and posterior frontal regions, affecting the cortex and white matter involving the frontal operculum, superior insula, and posterior frontal cortex almost to the vertex. A second smaller area of confluent RIGHT MCA territory ischemia affects the RIGHT parietal lobe. Resolving areas of punctate micro embolic acute infarction on the LEFT as demonstrated on the previous MR from 11/26/2015. No brainstem or posterior fossa involvement. Early hemorrhagic transformation of the acute infarction as demonstrated on gradient sequence. No confluent lobar hematoma. Global atrophy with chronic microvascular ischemic change. Widespread areas of chronic infarction, most notable in the LEFT MCA territory affecting the frontal lobe. Chronic areas of infarction are redemonstrated in the cerebellum, occipital lobes, and elsewhere in the RIGHT hemisphere. 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 Vascular: Flow voids are maintained. Thick-walled dolichoectatic basilar artery is patent. Skull and upper cervical spine: Partial empty sella. No tonsillar herniation. Advanced cervical spondylosis. Sinuses/Orbits: Conjugate gaze to  the RIGHT. BILATERAL cataract extraction. Slight layering fluid in the LEFT posterior ethmoids. Other: None. IMPRESSION: Acute regions of cerebral infarction within the RIGHT MCA territory, with the largest confluent area involving a RIGHT frontal and posterior frontal cortex and subcortical white matter. Early hemorrhagic transformation, gyriform/petechial, as seen on gradient sequence. These changes have developed since the previous MR of 11/26/2015. A call has been placed to the ordering provider to discuss the findings. Electronically Signed   By: Elsie StainJohn T Curnes M.D.   On: 12/05/2015 10:56    PHYSICAL EXAM Frail cachectic elderly african american lady. . Afebrile. Head is nontraumatic. Neck is supple without bruit.    Cardiac exam no murmur or gallop. Lungs are clear to auscultation. Distal pulses are well felt. Neurological Exam :  Stuporose. Can be aroused with sternal rub briefly but will not open eyes completely. He will not follow commands. She does not speak. Right gaze deviation. She will not follow my hand in a direction. Pupils irregular but reactive. She blinks partially to threat on the right but not on the left. Left lower facial weakness. Tongue midline. Motor system exam no right-sided semi-purposefully against gravity. Tone is increased on the right. She has much slight withdrawal in the left arm and left leg to painful stimuli but it is weaker than the right. Cannot  Test sensation and coordination reliably Gait was not tested. Both plantar ielicitation leads  to withdrawal response. ASSESSMENT/PLAN Rebecca [name] is a 70 y.o. female with history of atrial fibrillation on Eliquis, previous stroke with residual aphasia and hemiparesis, hypertension, diabetes mellitus, and hyperlipidemia admitted 9/13 with altered mental status and SOB. CT done showed a new large R MCA infarct. She did not receive IV t-PA due to unknown time last known well.   Stroke:  New large R MCA embolic  infarct in setting of previous L MCA embolic infarcts, felt to be secondary to atrial fibrillation   MRI  New large R MCA infarcts with hemorrhagic transformation  No need to repeat or do further testing as done earlier this month  Heparin 5000 units sq tid for VTE prophylaxis  Diet NPO time specified  On Eliquis and aspirin PTA, now on aspirin 81 mg daily ordered, though patient failed swallow  Recurrent Stroke is large and disabling in this lady with hx of stroke. Meaningful recovery is not expected. Neurologic status is poor. If family desire aggressive care, pt would need to resume anticoagulation via alternative feeding method (PEG). Recommend return to comfort care status. No family present during rounds.  Disposition:  Plan return to SNF  Nothing further to add from stroke standpoint. Will sign off. Call for questions  History of stroke  03/2013 expressive aphasia and right-sided weakness, MRI showed left frontal embolic infarct, Loop recorder later showed A. fib, patient will put on Xarelto  03/2014 episode of slumping over, global aphasia and right hemianopia. EEG negative. MRI showed left MCA infarcts. MRA showed left MCA decreased blood flow. A1c 6.1 and negative carotid Doppler with TTE EF 45-50%. Xarelto changed to eliquis  11/2015  left MCA punctate infarcts, embolic probably from atrial fibrillation vs. Infection, on eliquis  Atrial Fibrillation  Home anticoagulation:  aspirin 81 mg daily and Eliquis (apixaban) daily    Hypertension  Controlled  Hyperlipidemia  Home meds:  lipitor 40  LDL 70  Diabetes  HgbA1c 5.8, at goal < 7.0  Other Stroke Risk Factors  Advanced age  ?? Fabry's disease diagnosed in 2015 - however, alpha galactosidase was WNL.   Former Cigarette smoker  Hospital day # 4  BIBY,SHARON  [facility] See Amion for Pager information 12/06/2015 4:05 PM  I have personally examined this patient, reviewed notes, independently  viewed imaging studies, participated in medical decision making and plan of care.ROS completed by me personally and pertinent positives fully documented  I have made any additions or clarifications directly to the above note. Agree with note above.  The patient presented 10 days ago with a smaller stroke but given her poor general condition was made comfort care and all medications were held. She has now had a large right MCA embolic infarct from atrial fibrillation. Her neurological condition and overall general medical condition is very poor. She is unlikely to survive have any meaningful quality of life. The family has reversed  decision on DO NOT RESUSCITATE and comfort care but I do not think the patient is likely going to get any significant better. I do not believe further stroke evaluation and aggressive treatment is necessary. She cannot get anticoagulation unless she can swallow or has a feeding tube. Continue aspirin activated them. Discussed this with Dr. Cena Benton. No family available at the bedside for discussion. Greater than 50% time during this 25 minute visit was spent on coordination of care about her stroke  Delia Heady, MD Medical Director [facility] Pager: [phone] 12/06/2015 4:43 PM   To contact Stroke Continuity provider, please  refer to [email or web address] After hours, contact General Neurology

## 2015-12-06 NOTE — Progress Notes (Signed)
[facility]  PROGRESS NOTE  Rebecca [name]:[id number] DOB: 1945-07-20 DOA: 12/01/2015 PCP: Merrilee Seashore, MD   Brief HPI:  70 y.o. with history of combined systolic and diastolic heart failure with last EF of 55%, paroxysmal atrial fibrillation, CVA, hypertension. She presented with altered mental status and shortness of breath. She was found to have a likely pneumonia on chest x-ray which probably hasnt resolved from last admission.   Change in  Mental status on 9/17, a repeat CT head ordered, followed by MRI brain.  Requested neurology assistance.   Subjective   Patient has no new complaints. No acute issues overnight.    Assessment/Plan:    1. Acute hypoxic respiratory failure-multifactorial, likely from healthcare associated pneumonia and CHF. Patient given Lasix, IV antibiotics vancomycin and cefepime have been discontinued and patient started on Levaquin. Currently on [place] oxygen, with good oxygen saturations. Able to  Maintain airway.  2. Acute change in mental status from yesterday :  H/o recent punctate strokes. stat MRI brain ordered, and neurology consulted. As per the son pt has had 2 to 3 seizure activities at the SNF before admission, none reported on admission or during the hospitalization. Ordered EEG too. Pt had another stroke. Neurology has nothing more to offer and planning on signing off.  3. Anxiety- continue when necessary Ativan 4. Hypertension- well controlled.  5. Hypernatremia: mild. Will place on MIVF's 6. Anemia: anemia of chronic disease. stable 7. H/p Paroxysmal atrial fibrillation: currently NSR. eliquis was discontinued on 9/14. 8. H/o acute on chronic systolic CHF:  On IV lasix 40 mg BID. bnp added.    Goals of care- palliative care was consulted, patient was changed from comfort care to DO NOT RESUSCITATE. Palliative care has been fired by patient's daughter. As per my discussion with patient's son, patient continues FULL CODE. He wants her  to go back to skilled nursing facility with hospice if possible.   DVT prophylaxis: Heparin  Code Status: Full code,   Family Communication: Discussed with patient's son at bedside and updated.  Disposition Plan: Skilled nursing facility   Consultants:  Palliative care  Neurology.   Procedures:  None  Antibiotics:   Anti-infectives    Start     Dose/Rate Route Frequency Ordered Stop   12/05/15 2200  levofloxacin (LEVAQUIN) tablet 750 mg  Status:  Discontinued     750 mg Oral Every 48 hours 12/05/15 1130 12/05/15 1159   12/05/15 1300  levofloxacin (LEVAQUIN) IVPB 750 mg     750 mg 100 mL/hr over 90 Minutes Intravenous Every 48 hours 12/05/15 1159     12/04/15 2000  levofloxacin (LEVAQUIN) tablet 500 mg  Status:  Discontinued     500 mg Oral Every 24 hours 12/03/15 1959 12/05/15 1130   12/03/15 2100  levofloxacin (LEVAQUIN) IVPB 500 mg     500 mg 100 mL/hr over 60 Minutes Intravenous  Once 12/03/15 1959 12/03/15 2318   12/03/15 1915  levofloxacin (LEVAQUIN) tablet 500 mg  Status:  Discontinued     500 mg Oral Every 24 hours 12/03/15 1857 12/03/15 1959   12/02/15 0800  vancomycin (VANCOCIN) 500 mg in sodium chloride 0.9 % 100 mL IVPB  Status:  Discontinued     500 mg 100 mL/hr over 60 Minutes Intravenous Every 12 hours 12/01/15 1912 12/01/15 1932   12/02/15 0800  vancomycin (VANCOCIN) 500 mg in sodium chloride 0.9 % 100 mL IVPB  Status:  Discontinued     500 mg 100 mL/hr over 60  Minutes Intravenous Every 12 hours 12/01/15 1932 12/03/15 1857   12/01/15 1900  vancomycin (VANCOCIN) IVPB 1000 mg/200 mL premix     1,000 mg 200 mL/hr over 60 Minutes Intravenous  Once 12/01/15 1848 12/01/15 2047   12/01/15 1845  ceFEPIme (MAXIPIME) 2 g in dextrose 5 % 50 mL IVPB  Status:  Discontinued     2 g 100 mL/hr over 30 Minutes Intravenous Every 24 hours 12/01/15 1826 12/03/15 1857       Objective   Vitals:   12/05/15 2023 12/06/15 0129 12/06/15 0501 12/06/15 0942  BP: 117/79   136/86 131/82  Pulse: 96  88 75  Resp: 19  15 16   Temp: 98.1 F (36.7 C)  98.2 F (36.8 C) 98.1 F (36.7 C)  TempSrc: Axillary  Oral Axillary  SpO2: 100%  97% 100%  Weight:  54.2 kg (119 lb 7.8 oz)    Height:        Intake/Output Summary (Last 24 hours) at 12/06/15 1716 Last data filed at 12/06/15 1700  Gross per 24 hour  Intake                0 ml  Output              575 ml  Net             -575 ml   Filed Weights   12/01/15 2035 12/03/15 2044 12/06/15 0129  Weight: 61 kg (134 lb 7.7 oz) 59.9 kg (132 lb 0.9 oz) 54.2 kg (119 lb 7.8 oz)   Physical Examination:  General exam: lethargic, non verbal, responding to pain, in nad.   Respiratory system: Clear to auscultation. Respiratory effort normal. Cardiovascular system:  RRR. No  murmurs, rubs, gallops. No pedal edema. GI system: Abdomen is nondistended, non tender. Bowel sound heard.  Central nervous system. Letharic, non verbal continued. Right sided gaze, spasticity in the right upper extremity. Weakness in the left upper extremity. Gait could not be tested as she is mostly bed bound. Not oriented.  Skin: No rashes, lesions or ulcers.  Data Reviewed: I have personally reviewed following labs and imaging studies  CBG:  Recent Labs Lab 12/05/15 0718 12/05/15 2108 12/06/15 0813 12/06/15 1145 12/06/15 1621  GLUCAP 128* 107* 109* 118* 119*    CBC:  Recent Labs Lab 11/30/15 0544 12/01/15 1151 12/05/15 1101  WBC 3.8*  --  4.5  HGB 9.3* 11.2* 11.5*  HCT 30.5* 33.0* 36.8  MCV 80.7  --  80.3  PLT 161  --  183    Basic Metabolic Panel:  Recent Labs Lab 11/30/15 0544 12/01/15 1151 12/01/15 1233 12/05/15 1101  NA 143 150* 148* 147*  K 4.3 4.8 4.5 4.8  CL 114* 115* 116* 111  CO2 18*  --  20* 25  GLUCOSE 109* 121* 107* 130*  BUN 29* 52* 35* 50*  CREATININE 0.95 1.00 0.93 1.50*  CALCIUM 9.2  --  9.5 9.6    No results found for this or any previous visit (from the past 240 hour(s)).   Liver Function  Tests:  Recent Labs Lab 12/01/15 1233 12/05/15 1101  AST 40 56*  ALT 22 27  ALKPHOS 43 39  BILITOT 1.1 0.9  PROT 7.8 8.5*  ALBUMIN 3.5 3.5    Cardiac Enzymes: No results for input(s): CKTOTAL, CKMB, CKMBINDEX, TROPONINI in the last 168 hours. BNP (last 3 results)  Recent Labs  11/25/15 1110 12/01/15 1233  BNP 1,953.5* >4,500.0*    ProBNP (  last 3 results) No results for input(s): PROBNP in the last 8760 hours.    Studies: Ct Head Wo Contrast  Result Date: 12/05/2015 CLINICAL DATA:  Pt was found unresponsive on the floor of her home time down unknown She has left side weakness EXAM: CT HEAD WITHOUT CONTRAST TECHNIQUE: Contiguous axial images were obtained from the base of the skull through the vertex without intravenous contrast. COMPARISON:  12/01/2015 FINDINGS: Brain: No intracranial hemorrhage. No parenchymal contusion. No midline shift or mass effect. Basilar cisterns are patent. No skull base fracture. No fluid in the paranasal sinuses or mastoid air cells. Orbits are normal. New cortical hypodensity in the RIGHT frontotemporal lobe (image 19, series 201) within the territory of the RIGHT middle cerebral artery. Remote infarction within the LEFT middle cerebral artery territory again noted. Extensive periventricular subcortical white matter hypodensities unchanged. Atrophy and proportional ventricular dilatation unchanged Vascular: Vascular calcification of the circle Willis vessels. Skull:  No skull fracture Sinuses/Orbits: Paranasal sinuses and mastoid air cells are clear. Orbits are clear. Other: None IMPRESSION: 1. New RIGHT middle cerebral artery territory infarction. 2. Remote LEFT middle cerebral artery territory infarction. 3. Extensive white matter microvascular disease. These results will be called to the ordering clinician or representative by the Radiologist Assistant, and communication documented in the PACS or zVision Dashboard. Electronically Signed   By: Genevive Bi M.D.   On: 12/05/2015 08:35   Mr Brain Wo Contrast  Result Date: 12/05/2015 CLINICAL DATA:  Acute change in mental status, heart failure, shortness of breath, atrial fibrillation, history of seizures. EXAM: MRI HEAD WITHOUT CONTRAST TECHNIQUE: Multiplanar, multiecho pulse sequences of the brain and surrounding structures were obtained without intravenous contrast. COMPARISON:  MRI brain 11/26/2015. FINDINGS: Brain: Since the previous MR, the patient has developed a large area of confluent infarction in the RIGHT frontal and posterior frontal regions, affecting the cortex and white matter involving the frontal operculum, superior insula, and posterior frontal cortex almost to the vertex. A second smaller area of confluent RIGHT MCA territory ischemia affects the RIGHT parietal lobe. Resolving areas of punctate micro embolic acute infarction on the LEFT as demonstrated on the previous MR from 11/26/2015. No brainstem or posterior fossa involvement. Early hemorrhagic transformation of the acute infarction as demonstrated on gradient sequence. No confluent lobar hematoma. Global atrophy with chronic microvascular ischemic change. Widespread areas of chronic infarction, most notable in the LEFT MCA territory affecting the frontal lobe. Chronic areas of infarction are redemonstrated in the cerebellum, occipital lobes, and elsewhere in the RIGHT hemisphere. 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 Vascular: Flow voids are maintained. Thick-walled dolichoectatic basilar artery is patent. Skull and upper cervical spine: Partial empty sella. No tonsillar herniation. Advanced cervical spondylosis. Sinuses/Orbits: Conjugate gaze to the RIGHT. BILATERAL cataract extraction. Slight layering fluid in the LEFT posterior ethmoids. Other: None. IMPRESSION: Acute regions of cerebral infarction within the RIGHT MCA territory, with the largest  confluent area involving a RIGHT frontal and posterior frontal cortex and subcortical white matter. Early hemorrhagic transformation, gyriform/petechial, as seen on gradient sequence. These changes have developed since the previous MR of 11/26/2015. A call has been placed to the ordering provider to discuss the findings. Electronically Signed   By: Elsie Stain M.D.   On: 12/05/2015 10:56    Scheduled Meds: . aspirin EC  81 mg Oral Daily  . chlorhexidine  15 mL Mouth Rinse BID  . heparin  5,000 Units Subcutaneous Q8H  .  levofloxacin (LEVAQUIN) IV  750 mg Intravenous Q48H  . mouth rinse  15 mL Mouth Rinse q12n4p  . sodium chloride flush  3 mL Intravenous Q12H    Continuous Infusions: . dextrose 5 % and 0.45 % NaCl with KCl 20 mEq/L 75 mL/hr at 12/06/15 1240    Time spent: 30 min  Estephan Gallardo, [facility]   [facility] Pager [phone] If 7PM-7AM, please contact night-coverage at [email or web address], Office  [phone]  password [email or web address] 12/06/2015, 5:16 PM  LOS: 4 days

## 2015-12-07 LAB — BASIC METABOLIC PANEL
Anion gap: 12 (ref 5–15)
BUN: 50 mg/dL — AB (ref 6–20)
CHLORIDE: 110 mmol/L (ref 101–111)
CO2: 25 mmol/L (ref 22–32)
CREATININE: 1.33 mg/dL — AB (ref 0.44–1.00)
Calcium: 8.9 mg/dL (ref 8.9–10.3)
GFR calc Af Amer: 45 mL/min — ABNORMAL LOW (ref >60)
GFR, EST NON AFRICAN AMERICAN: 39 mL/min — AB (ref >60)
Glucose, Bld: 346 mg/dL — ABNORMAL HIGH (ref 65–99)
Potassium: 4.7 mmol/L (ref 3.5–5.1)
SODIUM: 147 mmol/L — AB (ref 135–145)

## 2015-12-07 LAB — GLUCOSE, CAPILLARY
GLUCOSE-CAPILLARY: 137 mg/dL — AB (ref 65–99)
GLUCOSE-CAPILLARY: 135 mg/dL — AB (ref 65–99)
GLUCOSE-CAPILLARY: 101 mg/dL — AB (ref 65–99)
Glucose-Capillary: 113 mg/dL — ABNORMAL HIGH (ref 65–99)
Glucose-Capillary: 122 mg/dL — ABNORMAL HIGH (ref 65–99)
Glucose-Capillary: 126 mg/dL — ABNORMAL HIGH (ref 65–99)

## 2015-12-07 LAB — CBC
HEMATOCRIT: 35.4 % — AB (ref 36.0–46.0)
HEMOGLOBIN: 10.6 g/dL — AB (ref 12.0–15.0)
MCH: 24.8 pg — ABNORMAL LOW (ref 26.0–34.0)
MCHC: 29.9 g/dL — ABNORMAL LOW (ref 30.0–36.0)
MCV: 82.7 fL (ref 78.0–100.0)
Platelets: 227 10*3/uL (ref 150–400)
RBC: 4.28 MIL/uL (ref 3.87–5.11)
RDW: 19.3 % — ABNORMAL HIGH (ref 11.5–15.5)
WBC: 4.9 10*3/uL (ref 4.0–10.5)

## 2015-12-07 LAB — PROTIME-INR
INR: 1.5
Prothrombin Time: 18.3 seconds — ABNORMAL HIGH (ref 11.4–15.2)

## 2015-12-07 MED ORDER — ASPIRIN 300 MG RE SUPP
300.0000 mg | Freq: Every day | RECTAL | Status: DC
  Administered 2015-12-07: 300 mg via RECTAL
  Filled 2015-12-07 (×2): qty 1

## 2015-12-07 NOTE — NC FL2 (Signed)
[place] MEDICAID FL2 LEVEL OF CARE SCREENING TOOL     IDENTIFICATION  Patient Name: Rebecca [name] Birthdate: 01/10/1946 Sex: female Admission Date (Current Location): 12/01/2015  [facility] [place]   Facility and Address:  [facility], [address], [place], [place] [address]      Provider Number: [id number]  Attending Physician Name and Address:  Penny Piarlando Vega, MD  Relative Name and Phone Number:  Storm FriskJames Mcquaid - [phone]    Current Level of Care: Hospital Recommended Level of Care: Skilled Nursing Facility Prior Approval Number:    Date Approved/Denied:   PASRR Number: [id number]  Discharge Plan: SNF    Current Diagnoses: Patient Active Problem List   Diagnosis Date Noted  . Cerebral thrombosis with cerebral infarction 12/06/2015  . Protein-calorie malnutrition, severe 12/06/2015  . Acute on chronic respiratory failure (HCC) 12/02/2015  . Dyspnea   . End of life care 12/01/2015  . Acute respiratory failure with hypoxia (HCC) 12/01/2015  . Lactic acidemia 12/01/2015  . Acute on chronic combined systolic and diastolic congestive heart failure (HCC)   . FTT (failure to thrive) in adult 11/30/2015  . 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 Anorexia symptom   . Palliative care encounter   . Encounter for hospice care discussion   . Cerebral infarction due to embolism of left middle cerebral artery (HCC)   . NSTEMI (non-ST elevated myocardial infarction) (HCC)   . Cerebral embolism with cerebral infarction 11/27/2015  . Acute encephalopathy 11/25/2015  . Hyperkalemia 11/25/2015  . HCAP (healthcare-associated pneumonia) 11/25/2015  . Community acquired pneumonia 11/25/2015  . 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 Systolic and diastolic CHF, chronic (HCC) 07/26/2015  . Anemia, chronic disease 07/23/2015  . Hematochezia 07/23/2015  . Lower GI bleed 07/18/2015  . Late effects of CVA (cerebrovascular  accident) 07/06/2015  . UTI (urinary tract infection) 04/18/2015  . E. coli UTI 04/05/2015  . 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 DNR (do not resuscitate) discussion   . Dysphagia S/P CVA (cerebrovascular accident) 04/22/2014  . 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 Global aphasia   . Paroxysmal atrial fibrillation (HCC)   . Hyperlipidemia   . Pulmonary hypertension (HCC)   . Elevated troponin 04/01/2014    Orientation RESPIRATION BLADDER Height & Weight     Self  Normal Continent - Patient has Foley Cath that was inserted 12/01/2015. Weight: 119 lb 7.8 oz (54.2 kg) Height:  5\' 4"  (162.6 cm)  BEHAVIORAL SYMPTOMS/MOOD NEUROLOGICAL BOWEL NUTRITION STATUS  Other (Comment) (Not interactive)   Incontinent Diet (Dysphagia 1 with [place]ector thick liquids)  AMBULATORY STATUS COMMUNICATION OF NEEDS Skin   Total Care (Patient did not ambulate with PT on 12/05/2015) Does not communicate Normal                       Personal Care Assistance Level of Assistance  Bathing, Feeding, Dressing Bathing Assistance: Maximum assistance Feeding assistance: Maximum assistance Dressing Assistance: Maximum assistance     Functional Limitations Info  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 OT  Frequency: Occupational Therapy evaluation completed on 12/06/2015 with recommedations of 2 times per week minimum.            Contractures      Additional Factors Info  Code Status, Allergies Code Status Info: Full Code Allergies Info: Lisonopril     Isolation Precautions Info:  Contact Precaution (ESBL in urine)     Current Medications (12/07/2015):  This is the current hospital active medication list Current Facility-Administered Medications  Medication Dose Route Frequency Provider Last Rate Last Dose  . 0.9 %  sodium chloride infusion  250 mL Intravenous PRN Ozella Rocks, MD 10 mL/hr at 12/02/15 0852 250 mL at 12/02/15 0852  . aspirin suppository 300 mg  300 mg Rectal Daily Layne Benton, NP      . bisacodyl (DULCOLAX) suppository 10 mg  10 mg Rectal PRN Ozella Rocks, MD      . chlorhexidine (PERIDEX) 0.12 % solution 15 mL  15 mL Mouth Rinse BID Narda Bonds, MD   15 mL at 12/07/15 1110  . dextrose 5 % and 0.45 % NaCl with KCl 20 mEq/L infusion   Intravenous Continuous Penny Pia, MD 75 mL/hr at 12/07/15 0609    . heparin injection 5,000 Units  5,000 Units Subcutaneous Q8H Ozella Rocks, MD   5,000 Units at 12/07/15 [phone]  . levofloxacin (LEVAQUIN) IVPB 750 mg  750 mg Intravenous Q48H Kathlen Mody, MD   750 mg at 12/05/15 1358  . MEDLINE mouth rinse  15 mL Mouth Rinse q12n4p Narda Bonds, MD   15 mL at 12/07/15 1110  . morphine 2 MG/ML injection 2 mg  2 mg Intravenous Q1H PRN Alita Chyle, NP      . ondansetron [facility]) tablet 4 mg  4 mg Oral Q6H PRN Ozella Rocks, MD       Or  . ondansetron [facility] injection 4 mg  4 mg Intravenous Q6H PRN Ozella Rocks, MD      . sodium chloride flush (NS) 0.9 % injection 3 mL  3 mL Intravenous Q12H Ozella Rocks, MD   3 mL at 12/07/15 1110  . sodium chloride flush (NS) 0.9 % injection 3 mL  3 mL Intravenous PRN Ozella Rocks, MD         Discharge Medications: Please see discharge summary for a list of discharge medications.  Relevant Imaging Results:  Relevant Lab Results:   Additional Information SSN:  [id number]  Mamye, Pech Work [phone]

## 2015-12-07 NOTE — Care Management Important Message (Signed)
Important Message  Patient Details  Name: Rebecca [name] MRN: [id number] Date of Birth: 11-10-1945   Medicare Important Message Given:  Yes    Deardra Hinkley, Annamarie Major, RN 12/07/2015, 11:39 AM

## 2015-12-07 NOTE — Progress Notes (Signed)
Patient ID: Rebecca [name], female   DOB: 09/22/1945, 70 y.o.   MRN: [id number]   Request received for percutaneous gastric tube placement  Chart review reveals previous unsuccessful attempt by IR 04/17/14: IMPRESSION: Status post attempted percutaneous gastrostomy tube. The procedure was not completed given that the distal transverse colon and splenic flexure were found to overlie the anterior surface of the stomach, as was seen on prior CT. No safe fluoroscopic window was present for attempted placement.  Gastric tube was placed in OR with Dr Derrell Lolling 04/20/2014  Discussed with Dr Bonnielee Haff Rec: request to [facility]  Discussed with Dr Vega---he will call [facility]

## 2015-12-07 NOTE — Progress Notes (Signed)
Occupational Therapy Treatment Patient Details Name: Rebecca [name] MRN: [id number] DOB: 06-21-1945 Today's Date: 12/07/2015    History of present illness Pt is a 70 y.o. female with PMH of DACODA TETA a 70 y.o.with history of combined systolic and diastolic heart failure with last EF of 55%, paroxysmal atrial fibrillation, CVA, hypertension. Pt admitted 9/7 with acute mid status change and shortness of breath. Pt was found to have a likely pneumonia on chest x-ray which was being treated during last admission and pulmonary congestion. MRI 9/8 showed 3 or 4 punctate acute infarcts in L MCA territory consistent with microembolic infarctions. Pt was discharged 9/12 and re-admitted 9/13 with acute mental status change and SOB; Neurologic changes noted 9/17, MRI shows new R MCA infarct.   OT comments  This 70 yo female admitted with above presents to acute OT with application of right palm guard splint this session, will follow for tolerance.   Follow Up Recommendations  SNF;Other (comment) (as they deem appropriate)    Equipment Recommendations  Other (comment) (TBD at next venue)       Precautions / Restrictions Precautions Precautions: Fall Precaution Comments: Watch for extensor tone when moving Required Braces or Orthoses: Other Brace/Splint Other Brace/Splint: palm guard to Right hand Restrictions Weight Bearing Restrictions: No                    Vision                 Additional Comments: pt with intermittent minimal eye opening throughout assessing and cleaning hand and then applying right palm guard          Cognition   Behavior During Therapy: Flat affect Overall Cognitive Status: No family/caregiver present to determine baseline cognitive functioning                         Exercises Other Exercises Other Exercises: I was able with minmal effort to get pt's fingers released from her palm where fingers nails are digging into her  palm, I then washed pt's right hand thoroughly, dried thoroughly, applied micro guard powder and then applied right palm guard           Pertinent Vitals/ Pain       Pain Location: Pt with increased movement of RUE in response to be working on opening her right hand (once I got fingers to release out of palm she settled down) Pain Intervention(s): Repositioned         Frequency  Min 2X/week        Progress Toward Goals  OT Goals(current goals can now be found in the care plan section)  Progress towards OT goals: Progressing toward goals     Plan Discharge plan remains appropriate       End of Session Equipment Utilized During Treatment:  (right palm guard)   Activity Tolerance Patient tolerated treatment well   Patient Left in bed   Nurse Communication  (right palm guard applied)        Time: 6967-8938 OT Time Calculation (min): 17 min  Charges: OT General Charges $OT Visit: 1 Procedure OT Treatments $Orthotics Fit/Training: 8-22 mins  Evette Georges [phone] 12/07/2015, 8:30 AM

## 2015-12-07 NOTE — Progress Notes (Signed)
Speech Language Pathology Treatment:    Patient Details Name: Rebecca [name] MRN: [id number] DOB: 1945-12-19 Today's Date: 12/07/2015 Time: [id number] SLP Time Calculation (min) (ACUTE ONLY): 16 min  Assessment / Plan / Recommendation Clinical Impression  Pt demonstrates no ability to swallow or manage secretions. Her tongue is protruded from her mouth and attempts at oral care or ice touched to lips and tongue elicit no purposeful response. Prognosis for recovery of swallow mechanism in the near future is poor. The pts son was outside the room when I arrived. He stated he knew she was not capable of eating or drinking and he just wanted her to be able to pass peacefully. Discussed with MD.    HPI HPI: Pt is a 70 y.o. female with PMH of Rebecca Bridgman Williamsis a 70 y.o.with history of combined systolic and diastolic heart failure with last EF of 55%, paroxysmal atrial fibrillation, CVA, hypertension. Pt admitted 9/7 with acute mid status change and shortness of breath. Pt was found to have a likely pneumonia on chest x-ray which was being treated during last admission and pulmonary congestion. MRI 9/8 showed 3 or 4 punctate acute infarcts in L MCA territory consistent with microembolic infarctions. Pt was previously followed by SLP on 9/8 with recommendation for dysphagia 1/ nectar-thick liquids. Pt was discharged 9/12 and re-admitted 9/13 with acute mental status change and SOB, found to have a likely PNA and pulmonary congestion.  Current diet is dysphagia 1/ thin liquids. Bedside swallow eval re-ordered.      SLP Plan  Continue with current plan of care     Recommendations  Diet recommendations: NPO                Plan: Continue with current plan of care       GO               [facility], MA CCC-SLP [phone]  [name] 12/07/2015, 2:00 PM

## 2015-12-07 NOTE — Care Management Note (Signed)
Case Management Note  Patient Details  Name: GURNOOR LAHN MRN: [id number] Date of Birth: [birth year]  Subjective/Objective:      CM following for progression and d/c summary.               Action/Plan: 12/07/2015 Noted MD notes stating that pt may only go to SNF without feeding tube. Per CSW V Crawford and this CM , this has not been our experience. This CM contacted Dr Cena Benton, who is also under this impression. Dr Cena Benton pointed out that IR has declined to try to place a tube based on pt past studies. Noted surgical plan to discuss this with family tomorrow 12/08/2015.  This pt remains very unstable, responds only to painful stimuli.  Will discuss with surgery tomorrow after the surgeon has discussed with family. Will also followup with Rep for [facility] re concern about pt return to SNF.   Expected Discharge Date:                  Expected Discharge Plan:  Skilled Nursing Facility  In-House Referral:  Clinical Social Work  Discharge planning Services  CM Consult  Post Acute Care Choice:  NA Choice offered to:  NA  DME Arranged:  N/A DME Agency:     HH Arranged:  NA HH Agency:  NA  Status of Service:  Completed, signed off  If discussed at Long Length of Stay Meetings, dates discussed:    Additional Comments:  Starlyn Skeans, RN 12/07/2015, 4:19 PM

## 2015-12-07 NOTE — Progress Notes (Signed)
[facility]  PROGRESS NOTE  Rebecca [name] NWG:[id number] DOB: 06/16/1945 DOA: 12/01/2015 PCP: Merrilee SeashoreAnne Alexander, MD   Brief HPI:  70 y.o. with history of combined systolic and diastolic heart failure with last EF of 55%, paroxysmal atrial fibrillation, CVA, hypertension. She presented with altered mental status and shortness of breath. She was found to have a likely pneumonia on chest x-ray which probably hasnt resolved from last admission.   The patient has had several CVAs and as such neurology has been following. Prognosis poor and this is been discussed with patient's son alongside neurologist. Son would like to proceed with PEG tube placement as such consulted general surgery  Subjective   Son states that he would like PEG tube placed for nutrition. Per my discussion with nursing there was concern about being able to place patient into a skilled nursing facility with PEG tube versus no PEG tube if patient is unable to feed themselves. I have discussed this with care manager who will assist me in finding out for disposition purposes.   Assessment/Plan:    1. Acute hypoxic respiratory failure-multifactorial, likely from healthcare associated pneumonia and CHF. Patient given Lasix, IV antibiotics vancomycin and cefepime have been discontinued and patient started on Levaquin. Currently on [place] oxygen, with good oxygen saturations. Able to  Maintain airway.  2. Acute change in mental status from yesterday :  H/o recent punctate strokes. stat MRI brain ordered, and neurology consulted. As per the son pt has had 2 to 3 seizure activities at the SNF before admission, none reported on admission or during the hospitalization. Ordered EEG too. Pt had another stroke. Neurology has nothing more to offer and planning on signing off. Discussed poor prognosis with patient's son. Attempted to call daughter multiple times with no response  3. Anxiety- continue when necessary Ativan 4. Hypertension- well  controlled.  5. Hypernatremia: mild. Will place on MIVF's 6. Anemia: anemia of chronic disease. stable 7. H/p Paroxysmal atrial fibrillation: currently NSR. eliquis was discontinued on 9/14. 8. H/o acute on chronic systolic CHF:  On IV lasix 40 mg BID. bnp added.    Goals of care- palliative care was consulted, patient was changed from comfort care to DO NOT RESUSCITATE. Palliative care has been fired by patient's daughter. As per my discussion with patient's son, patient continues FULL CODE. He wants her to go back to skilled nursing facility. After discussion son wants PEG tube placed for nutrition despite poor prognosis and our recommendations for comfort care measures.  DVT prophylaxis: Heparin  Code Status: Full code,   Family Communication: Discussed with patient's son at bedside and updated.  Disposition Plan: Skilled nursing facility   Consultants:  Palliative care  Neurology.   General surgery  Procedures:  None  Antibiotics:   Anti-infectives    Start     Dose/Rate Route Frequency Ordered Stop   12/05/15 2200  levofloxacin (LEVAQUIN) tablet 750 mg  Status:  Discontinued     750 mg Oral Every 48 hours 12/05/15 1130 12/05/15 1159   12/05/15 1300  levofloxacin (LEVAQUIN) IVPB 750 mg     750 mg 100 mL/hr over 90 Minutes Intravenous Every 48 hours 12/05/15 1159     12/04/15 2000  levofloxacin (LEVAQUIN) tablet 500 mg  Status:  Discontinued     500 mg Oral Every 24 hours 12/03/15 1959 12/05/15 1130   12/03/15 2100  levofloxacin (LEVAQUIN) IVPB 500 mg     500 mg 100 mL/hr over 60 Minutes Intravenous  Once 12/03/15 1959  12/03/15 2318   12/03/15 1915  levofloxacin (LEVAQUIN) tablet 500 mg  Status:  Discontinued     500 mg Oral Every 24 hours 12/03/15 1857 12/03/15 1959   12/02/15 0800  vancomycin (VANCOCIN) 500 mg in sodium chloride 0.9 % 100 mL IVPB  Status:  Discontinued     500 mg 100 mL/hr over 60 Minutes Intravenous Every 12 hours 12/01/15 1912 12/01/15 1932    12/02/15 0800  vancomycin (VANCOCIN) 500 mg in sodium chloride 0.9 % 100 mL IVPB  Status:  Discontinued     500 mg 100 mL/hr over 60 Minutes Intravenous Every 12 hours 12/01/15 1932 12/03/15 1857   12/01/15 1900  vancomycin (VANCOCIN) IVPB 1000 mg/200 mL premix     1,000 mg 200 mL/hr over 60 Minutes Intravenous  Once 12/01/15 1848 12/01/15 2047   12/01/15 1845  ceFEPIme (MAXIPIME) 2 g in dextrose 5 % 50 mL IVPB  Status:  Discontinued     2 g 100 mL/hr over 30 Minutes Intravenous Every 24 hours 12/01/15 1826 12/03/15 1857       Objective   Vitals:   12/06/15 1730 12/06/15 2147 12/07/15 0405 12/07/15 0936  BP: 129/89 (!) 147/81 (!) 141/85 (!) 137/92  Pulse: 77 98 66 62  Resp: 16 18 16 17   Temp: 97.8 F (36.6 C) 97.8 F (36.6 C) 98.1 F (36.7 C) 97.7 F (36.5 C)  TempSrc: Axillary Axillary Axillary Axillary  SpO2: 100% 100% 100% 100%  Weight:      Height:        Intake/Output Summary (Last 24 hours) at 12/07/15 1725 Last data filed at 12/07/15 1330  Gross per 24 hour  Intake              700 ml  Output              370 ml  Net              330 ml   Filed Weights   12/01/15 2035 12/03/15 2044 12/06/15 0129  Weight: 61 kg (134 lb 7.7 oz) 59.9 kg (132 lb 0.9 oz) 54.2 kg (119 lb 7.8 oz)   Physical Examination:  General exam: lethargic, non verbal, Fixed gaze to the right, in nad.   Respiratory system: Clear to auscultation. Respiratory effort normal. Cardiovascular system:  RRR. No  murmurs, rubs, gallops. No pedal edema. GI system: Abdomen is nondistended, non tender. Bowel sound heard.  Central nervous system. Letharic, non verbal continued. Right sided gaze, spasticity in the right upper extremity. Weakness in the left upper extremity. Gait could not be tested as she is mostly bed bound. Not oriented.  Skin: No rashes, lesions or ulcers.  Data Reviewed: I have personally reviewed following labs and imaging studies  CBG:  Recent Labs Lab 12/07/15 0044  12/07/15 0409 12/07/15 0743 12/07/15 1137 12/07/15 1635  GLUCAP 122* 113* 137* 135* 126*    CBC:  Recent Labs Lab 12/01/15 1151 12/05/15 1101  WBC  --  4.5  HGB 11.2* 11.5*  HCT 33.0* 36.8  MCV  --  80.3  PLT  --  183    Basic Metabolic Panel:  Recent Labs Lab 12/01/15 1151 12/01/15 1233 12/05/15 1101 12/07/15 0015  NA 150* 148* 147* 147*  K 4.8 4.5 4.8 4.7  CL 115* 116* 111 110  CO2  --  20* 25 25  GLUCOSE 121* 107* 130* 346*  BUN 52* 35* 50* 50*  CREATININE 1.00 0.93 1.50* 1.33*  CALCIUM  --  9.5 9.6 8.9    No results found for this or any previous visit (from the past 240 hour(s)).   Liver Function Tests:  Recent Labs Lab 12/01/15 1233 12/05/15 1101  AST 40 56*  ALT 22 27  ALKPHOS 43 39  BILITOT 1.1 0.9  PROT 7.8 8.5*  ALBUMIN 3.5 3.5    Cardiac Enzymes: No results for input(s): CKTOTAL, CKMB, CKMBINDEX, TROPONINI in the last 168 hours. BNP (last 3 results)  Recent Labs  11/25/15 1110 12/01/15 1233  BNP 1,953.5* >4,500.0*    ProBNP (last 3 results) No results for input(s): PROBNP in the last 8760 hours.    Studies: No results found.  Scheduled Meds: . aspirin  300 mg Rectal Daily  . chlorhexidine  15 mL Mouth Rinse BID  . heparin  5,000 Units Subcutaneous Q8H  . levofloxacin (LEVAQUIN) IV  750 mg Intravenous Q48H  . mouth rinse  15 mL Mouth Rinse q12n4p  . sodium chloride flush  3 mL Intravenous Q12H    Continuous Infusions: . dextrose 5 % and 0.45 % NaCl with KCl 20 mEq/L 75 mL/hr at 12/07/15 0609    Time spent: 30 min  Chaselyn Nanney, [facility]   [facility] Pager [phone] If 7PM-7AM, please contact night-coverage at [email or web address], Office  [phone]  password [email or web address] 12/07/2015, 5:25 PM  LOS: 5 days

## 2015-12-07 NOTE — Progress Notes (Signed)
Palliative Medicine RN Note: We have not been contacted by the family to re-engage. PMT will officially sign off. Please re-consult if family would like to meet with [place] again.  Margret Chance Shawonda Kerce, RN, BSN, [facility] 12/07/2015 1:18 PM Cell [phone] 8:00-4:00 Monday-Friday Office [phone]

## 2015-12-07 NOTE — Progress Notes (Signed)
STROKE TEAM PROGRESS NOTE   HISTORY OF PRESENT ILLNESS (per record) Rebecca [name] is a 70 y.o. female with a recent history of small punctate infarcts in left MCA distribution. She was admitted on September 8, then discharged on September 12. She was readmitted on September 13 with hospital-acquired pneumonia. She apparently was minimally verbal at baseline, would occasionally obey commands. During her hospitalization, she was seen by the palliative care team. She was switched to comfort only care after conversations with the patient's son who is also her caregiver, but then subsequently apparently she was made full code again with aggressive care. It was noticed last night that she was not quite as interactive as normal and a CT head was performed this morning. This shows a developing moderate to large MCA territory infarction.  There has been some concern for seizures, but was not represented in the chart. A routine EEG could be reasonable, but it is not thought her events of the past 24 hours are due to seizure.  Patient was not administered IV t-PA secondary to unknown last known well.    SUBJECTIVE (INTERVAL HISTORY) Son present at bedside. Patient lying in bed. No change in physical exam since yesterday.    OBJECTIVE Temp:  [97.7 F (36.5 C)-98.1 F (36.7 C)] 97.7 F (36.5 C) (09/19 0936) Pulse Rate:  [62-98] 62 (09/19 0936) Cardiac Rhythm: Other (Comment) (09/19 0707) Resp:  [16-18] 17 (09/19 0936) BP: (129-147)/(81-92) 137/92 (09/19 0936) SpO2:  [100 %] 100 % (09/19 0936)  CBC:   Recent Labs Lab 12/01/15 1151 12/05/15 1101  WBC  --  4.5  HGB 11.2* 11.5*  HCT 33.0* 36.8  MCV  --  80.3  PLT  --  183    Basic Metabolic Panel:   Recent Labs Lab 12/05/15 1101 12/07/15 0015  NA 147* 147*  K 4.8 4.7  CL 111 110  CO2 25 25  GLUCOSE 130* 346*  BUN 50* 50*  CREATININE 1.50* 1.33*  CALCIUM 9.6 8.9    Lipid Panel:     Component Value Date/Time   CHOL 133  11/27/2015 0534   TRIG 54 11/27/2015 0534   HDL 52 11/27/2015 0534   CHOLHDL 2.6 11/27/2015 0534   VLDL 11 11/27/2015 0534   LDLCALC 70 11/27/2015 0534   HgbA1c:  Lab Results  Component Value Date   HGBA1C 5.8 (H) 11/27/2015   Urine Drug Screen:     Component Value Date/Time   LABOPIA NONE DETECTED 11/25/2015 1243   COCAINSCRNUR NONE DETECTED 11/25/2015 1243   LABBENZ NONE DETECTED 11/25/2015 1243   AMPHETMU NONE DETECTED 11/25/2015 1243   THCU NONE DETECTED 11/25/2015 1243   LABBARB NONE DETECTED 11/25/2015 1243      IMAGING  No results found.  PHYSICAL EXAM Frail cachectic elderly african american lady. . Afebrile. Head is nontraumatic. Neck is supple without bruit.    Cardiac exam no murmur or gallop. Lungs are clear to auscultation. Distal pulses are well felt. Neurological Exam :  Stuporose. Can be aroused with sternal rub briefly but will not open eyes completely. She will not follow commands. She does not speak. Right gaze deviation. She will not follow my hand in a direction. Pupils irregular but reactive. She blinks partially to threat on the right but not on the left. Left lower facial weakness. Tongue midline. Motor system exam no right-sided semi-purposefully against gravity. Tone is increased on the right. She has much slight withdrawal in the left arm and left leg to painful  stimuli but it is weaker than the right. Cannot  Test sensation and coordination reliably Gait was not tested. Both plantar ielicitation leads  to withdrawal response. ASSESSMENT/PLAN Rebecca [name] is a 70 y.o. female with history of atrial fibrillation on Eliquis, previous stroke with residual aphasia and hemiparesis, hypertension, diabetes mellitus, and hyperlipidemia admitted 9/13 with altered mental status and SOB. CT done showed a new large R MCA infarct. She did not receive IV t-PA due to unknown time last known well.   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 Meaningful recovery is not expected. Neurologic status is poor. If family desire aggressive care, pt would need to resume anticoagulation via alternative feeding method (PEG). Recommend return to comfort care status. No family present during rounds.  Disposition:  Plan return to SNF  Nothing further to add from stroke standpoint. Will sign off. Call for questions  History of stroke  03/2013 expressive aphasia and right-sided weakness, MRI showed left frontal embolic infarct, Loop recorder later showed A. fib, patient will put on Xarelto  03/2014 episode of slumping over, global aphasia and right hemianopia. EEG negative. MRI showed left MCA infarcts. MRA showed left MCA decreased blood flow. A1c 6.1 and negative carotid Doppler with TTE EF 45-50%. Xarelto changed to eliquis  11/2015  left MCA punctate infarcts, embolic probably from atrial fibrillation vs. Infection, on eliquis  Atrial Fibrillation  Home anticoagulation:  aspirin 81 mg daily and Eliquis (apixaban) daily    Hypertension  Controlled  Hyperlipidemia  Home meds:  lipitor 40  LDL 70  Diabetes  HgbA1c 5.8, at goal < 7.0  Other Stroke Risk Factors  Advanced age  ?? Fabry's disease diagnosed in 2015 - however, alpha galactosidase was WNL.   Former Cigarette smoker  Hospital day # 5  Casi Westerfeld  [facility] See Amion for Pager information 12/07/2015 1:17 PM  I have personally examined this patient, reviewed notes, independently viewed imaging studies,  participated in medical decision making and plan of care.ROS completed by me personally and pertinent positives fully documented  I have made any additions or clarifications directly to the above note. Agree with note above.  The patient presented 10 days ago with a smaller stroke but given her poor general condition was made comfort care and all medications were held. She has now had a large right MCA embolic infarct from atrial fibrillation. Her neurological condition and overall general medical condition is very poor. She is unlikely to survive have any meaningful quality of life. The family has reversed  decision on DO NOT RESUSCITATE and comfort care but I do not think the patient is likely going to get any significant better. I do not believe further stroke evaluation and aggressive treatment is necessary. She cannot get anticoagulation unless she can swallow or has a feeding tube. Continue aspirin rectally. Discussed this with Dr. Cena Benton. I had a very long discussion with the patient's son at the bedside regarding her poor prognosis and overall poor  general medical condition and chances of survival and achieving any meaningful quality of life being quite slim. He understands her poor condition but feels that he is under a lot of pressure to continue support since he lost his brother from a stroke a month ago and patient's daughter who lives out of state does not want to withdraw care at the present time. He realizes patient cannot go to a skilled nursing facility without a feeding tube and reluctantly agrees to have been placed.. Continue aspirin-he has A dense which over 2 anticoagulation.  Stroke team will sign off kindly call for questions. Greater than 50% time during this 35 minute visit was spent on coordination of care about her stroke  Delia HeadyPramod Blima Jaimes, MD Medical Director [facility] Pager: [phone] 12/07/2015 1:17 PM   To contact Stroke Continuity provider, please refer to  [email or web address]. After hours, contact General Neurology STROKE TEAM PROGRESS NOTE   HISTORY OF PRESENT ILLNESS (per record) Shellia CarwinConstance E Espy is a 70 y.o. female with a recent history of small punctate infarcts in left MCA distribution. She was admitted on September 8, then discharged on September 12. She was readmitted on September 13 with hospital-acquired pneumonia. She apparently was minimally verbal at baseline, would occasionally obey commands. During her hospitalization, she was seen by the palliative care team. She was switched to comfort only care after conversations with the patient's son who is also her caregiver, but then subsequently apparently she was made full code again with aggressive care. It was noticed last night that she was not quite as interactive as normal and a CT head was performed this morning. This shows a developing moderate to large MCA territory infarction.  There has been some concern for seizures, but was not represented in the chart. A routine EEG could be reasonable, but it is not thought her events of the past 24 hours are due to seizure.  Patient was not administered IV t-PA secondary to unknown last known well.    SUBJECTIVE (INTERVAL HISTORY) No family present. Patient lying in bed. No change in physical exam since yesterday.    OBJECTIVE Temp:  [97.7 F (36.5 C)-98.1 F (36.7 C)] 97.7 F (36.5 C) (09/19 0936) Pulse Rate:  [62-98] 62 (09/19 0936) Cardiac Rhythm: Other (Comment) (09/19 0707) Resp:  [16-18] 17 (09/19 0936) BP: (129-147)/(81-92) 137/92 (09/19 0936) SpO2:  [100 %] 100 % (09/19 0936)  CBC:   Recent Labs Lab 12/01/15 1151 12/05/15 1101  WBC  --  4.5  HGB 11.2* 11.5*  HCT 33.0* 36.8  MCV  --  80.3  PLT  --  183    Basic Metabolic Panel:   Recent Labs Lab 12/05/15 1101 12/07/15 0015  NA 147* 147*  K 4.8 4.7  CL 111 110  CO2 25 25  GLUCOSE 130* 346*  BUN 50* 50*  CREATININE 1.50* 1.33*  CALCIUM 9.6 8.9    Lipid  Panel:     Component Value Date/Time   CHOL 133 11/27/2015 0534   TRIG 54 11/27/2015 0534   HDL 52 11/27/2015 0534   CHOLHDL 2.6 11/27/2015 0534   VLDL 11 11/27/2015 0534   LDLCALC 70 11/27/2015 0534   HgbA1c:  Lab Results  Component Value Date   HGBA1C 5.8 (H) 11/27/2015   Urine Drug Screen:     Component Value Date/Time   LABOPIA NONE DETECTED 11/25/2015 1243   COCAINSCRNUR NONE DETECTED 11/25/2015 1243   LABBENZ NONE DETECTED 11/25/2015 1243   AMPHETMU NONE DETECTED 11/25/2015  1243   THCU NONE DETECTED 11/25/2015 1243   LABBARB NONE DETECTED 11/25/2015 1243      IMAGING  No results found.  PHYSICAL EXAM Frail cachectic elderly african american lady. . Afebrile. Head is nontraumatic. Neck is supple without bruit.    Cardiac exam no murmur or gallop. Lungs are clear to auscultation. Distal pulses are well felt. Neurological Exam :  Stuporose. Can be aroused with sternal rub briefly but will not open eyes completely. He will not follow commands. She does not speak. Right gaze deviation. She will not follow my hand in a direction. Pupils irregular but reactive. She blinks partially to threat on the right but not on the left. Left lower facial weakness. Tongue midline. Motor system exam no right-sided semi-purposefully against gravity. Tone is increased on the right. She has much slight withdrawal in the left arm and left leg to painful stimuli but it is weaker than the right. Cannot  Test sensation and coordination reliably Gait was not tested. Both plantar ielicitation leads  to withdrawal response. ASSESSMENT/PLAN Rebecca [name] is a 70 y.o. female with history of atrial fibrillation on Eliquis, previous stroke with residual aphasia and hemiparesis, hypertension, diabetes mellitus, and hyperlipidemia admitted 9/13 with altered mental status and SOB. CT done showed a new large R MCA infarct. She did not receive IV t-PA due to unknown time last known well.   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 Meaningful recovery is not expected. Neurologic status is poor. If family desire aggressive care, pt would need to resume anticoagulation via alternative feeding method (PEG). Recommend return to comfort care status. No family present during rounds.  Disposition:  Plan return to SNF  Nothing further to add from stroke standpoint. Will sign off. Call for questions  History of stroke  03/2013 expressive aphasia and right-sided weakness, MRI showed left frontal embolic infarct, Loop recorder later showed A. fib, patient will put on Xarelto  03/2014 episode of slumping over, global aphasia and right hemianopia. EEG negative. MRI showed left MCA infarcts. MRA showed left MCA decreased blood flow. A1c 6.1 and negative carotid Doppler with TTE EF 45-50%. Xarelto changed to eliquis  11/2015  left MCA punctate infarcts, embolic probably from atrial fibrillation vs. Infection, on eliquis  Atrial Fibrillation  Home anticoagulation:  aspirin 81 mg daily and Eliquis (apixaban) daily    Hypertension  Controlled  Hyperlipidemia  Home meds:  lipitor 40  LDL 70  Diabetes  HgbA1c 5.8, at goal < 7.0  Other Stroke Risk Factors  Advanced age  ?? Fabry's disease diagnosed in 2015 - however, alpha galactosidase was WNL.   Former Cigarette smoker  Hospital day # 5  Ambers Iyengar  [facility] See Amion for Pager information 12/07/2015 1:17 PM  I have personally examined this patient, reviewed  notes, independently viewed imaging studies, participated in medical decision making and plan of care.ROS completed by me personally and pertinent positives fully documented  I have made any additions  or clarifications directly to the above note. Agree with note above.  The patient presented 10 days ago with a smaller stroke but given her poor general condition was made comfort care and all medications were held. She has now had a large right MCA embolic infarct from atrial fibrillation. Her neurological condition and overall general medical condition is very poor. She is unlikely to survive have any meaningful quality of life. The family has reversed  decision on DO NOT RESUSCITATE and comfort care but I do not think the patient is likely going to get any significant better. I do not believe further stroke evaluation and aggressive treatment is necessary. She cannot get anticoagulation unless she can swallow or has a feeding tube. Continue aspirin activated them. Discussed this with Dr. Cena Benton. No family available at the bedside for discussion. Greater than 50% time during this 25 minute visit was spent on coordination of care about her stroke  Delia Heady, MD Medical Director [facility] Pager: [phone] 12/07/2015 1:17 PM   To contact Stroke Continuity provider, please refer to [email or web address]. After hours, contact General Neurology

## 2015-12-07 NOTE — Consult Note (Signed)
Reason for Consult: Replacement of gastrotomy feeding tube Referring Physician: DR. Hillary Bow  PCP: Inocencio Homes, MD  Rebecca [name] is an 70 y.o. female.   HPI: PT WITH hx of CVA and strokes, non verbal.  Readmitted on 12/01/15 after d/c to SNF on 11/30/15.  Hospitalized on 9/7 with acute encephalopathy, and sepsis, new RLL pneumonia, and multiple punctate infarcts.  She was on a stage 1 dysphagia diet at that time but not eating at that time.  She was seen by Palliative during that hospitalization. Her son agreed to comfort care and Hospice care at the SNF.  Her daughter in [place] wants her to be a full code.  She went to SNF on 11/30/15 and returned to the ED on the following day via EMS with saturations in the 80's, on R/A, this improved with O2 up to the 90's.  She then became apneic and required assisted ventilation by EMS.  She did not have a pneumonia on readmission, but did have pulmonary edema with BNP >4500. She was treated in the ED and admitted, she was also seen by Palliative care again. She was being treated conservatively with morphine for respiratory distress and on 12/03/15 they were ask to "step back," from the patients care by her daughter, DNR status reversed at this point also.  Initial Swallow evaluation recommended D1 with with small bits/cues for eating, nectar thick liquids. By 9/18 they recommended she be NPO secondary to decline in her mentation.  Rapid response 9/17 non responsive with right sided gaze.  Reported seizure activity at SNF .  Seen by neurology 9/17, MRI at that time shows: New large R MCA embolic infarct in setting of previous L MCA embolic infarcts, felt to be secondary to atrial fibrillation  MRI  New large R MCA infarcts with hemorrhagic transformation changes from study on  11/26/15.  Meanful recovery is not expected.  At this point she cannot swallow and we are ask to see about Feeding tube placement.  She is now a full code. DNR has been discontinued by the family,  she is a full code.  Past Medical History:  Diagnosis Date  . Atrial fibrillation ([place]) ON CHRONIC ANTICOAGULATION 05/01/2013  . Depression   . Diabetes mellitus without complication ([place])   . Expressive aphasia 03/23/2013  . 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 Hypertension   . Stroke [facility]) 2015 2016   Aphasia WITH SECOND STROKE 2016  . Tobacco abuse 05/01/2013   . dextrose 5 % and 0.45 % NaCl with KCl 20 mEq/L 75 mL/hr at 12/07/15 2119    Past Surgical History:  Procedure Laterality Date  . CESAREAN SECTION     x3  . hip replacement Right 1990's  . hip replacemet Left 1990's  . LAPAROSCOPIC GASTROSTOMY N/A 04/20/2014   Procedure: LAPAROSCOPIC GASTROSTOMY TUBE PLACEMENT;  Surgeon: Ralene Ok, MD;  Location: [facility];  Service: General;  Laterality: N/A;  . PEG tube placemnt  04/15/14  . TEE WITHOUT CARDIOVERSION N/A 03/26/2013   Procedure: TRANSESOPHAGEAL ECHOCARDIOGRAM (TEE);  Surgeon: Sanda Klein, MD;  Location[facility] ENDOSCOPY;  Service: Cardiovascular;  Laterality: N/A;  . TOOTH EXTRACTION Right 1/17   lower canine    Family History  Problem Relation Age of Onset  . Alcohol abuse Maternal Aunt     Social History:  reports that she has never smoked. She has never used smokeless tobacco. She reports that she does not drink alcohol or use drugs.  Allergies:  Allergies  Allergen Reactions  . Lisinopril Swelling    Medications:  Prior to Admission:  Prescriptions Prior to Admission  Medication Sig Dispense Refill Last Dose  . acetaminophen (TYLENOL) 325 MG tablet Take 2 tablets (650 mg total) by mouth every 6 (six) hours as needed for mild pain (or Fever >/= 101).   Taking  . acetaminophen (TYLENOL) 650 MG suppository Place 1 suppository (650 mg total) rectally every 6 (six) hours as needed for mild pain (or Fever >/= 101). 12 suppository 0 Taking  . apixaban (ELIQUIS) 5 MG TABS tablet Take 5 mg by mouth 2 (two) times daily.    11/30/2015 at 2000  .  aspirin EC 81 MG EC tablet Take 1 tablet (81 mg total) by mouth daily.   11/30/2015 at 0800  . bisacodyl (DULCOLAX) 10 MG suppository Place 1 suppository (10 mg total) rectally as needed for moderate constipation. 12 suppository 0 Taking  . metoprolol tartrate (LOPRESSOR) 25 MG tablet Take 0.5 tablets (12.5 mg total) by mouth 2 (two) times daily. 60 tablet 0 11/30/2015 at 2000  . mirtazapine (REMERON) 7.5 MG tablet Take 7.5 mg by mouth at bedtime.   11/30/2015 at 2100  . sertraline (ZOLOFT) 50 MG tablet Take 75 mg by mouth daily.    not noted at not noted  . Skin Protectants, Misc. (CALAZIME SKIN PROTECTANT EX) Apply 1 application topically 2 (two) times daily. TO BUTTOCKS   11/30/2015 at 2000   Scheduled: . aspirin  300 mg Rectal Daily  . chlorhexidine  15 mL Mouth Rinse BID  . heparin  5,000 Units Subcutaneous Q8H  . levofloxacin (LEVAQUIN) IV  750 mg Intravenous Q48H  . mouth rinse  15 mL Mouth Rinse q12n4p  . sodium chloride flush  3 mL Intravenous Q12H   Continuous: . dextrose 5 % and 0.45 % NaCl with KCl 20 mEq/L 75 mL/hr at 12/07/15 0609   MIW:OEHOZY chloride, bisacodyl, morphine injection, ondansetron **OR** ondansetron (ZOFRAN) IV, sodium chloride flush Anti-infectives    Start     Dose/Rate Route Frequency Ordered Stop   12/05/15 2200  levofloxacin (LEVAQUIN) tablet 750 mg  Status:  Discontinued     750 mg Oral Every 48 hours 12/05/15 1130 12/05/15 1159   12/05/15 1300  levofloxacin (LEVAQUIN) IVPB 750 mg     750 mg 100 mL/hr over 90 Minutes Intravenous Every 48 hours 12/05/15 1159     12/04/15 2000  levofloxacin (LEVAQUIN) tablet 500 mg  Status:  Discontinued     500 mg Oral Every 24 hours 12/03/15 1959 12/05/15 1130   12/03/15 2100  levofloxacin (LEVAQUIN) IVPB 500 mg     500 mg 100 mL/hr over 60 Minutes Intravenous  Once 12/03/15 1959 12/03/15 2318   12/03/15 1915  levofloxacin (LEVAQUIN) tablet 500 mg  Status:  Discontinued     500 mg Oral Every 24 hours 12/03/15 1857  12/03/15 1959   12/02/15 0800  vancomycin (VANCOCIN) 500 mg in sodium chloride 0.9 % 100 mL IVPB  Status:  Discontinued     500 mg 100 mL/hr over 60 Minutes Intravenous Every 12 hours 12/01/15 1912 12/01/15 1932   12/02/15 0800  vancomycin (VANCOCIN) 500 mg in sodium chloride 0.9 % 100 mL IVPB  Status:  Discontinued     500 mg 100 mL/hr over 60 Minutes Intravenous Every 12 hours 12/01/15 1932 12/03/15 1857   12/01/15 1900  vancomycin (VANCOCIN) IVPB 1000 mg/200 mL premix     1,000 mg 200 mL/hr over  60 Minutes Intravenous  Once 12/01/15 1848 12/01/15 2047   12/01/15 1845  ceFEPIme (MAXIPIME) 2 g in dextrose 5 % 50 mL IVPB  Status:  Discontinued     2 g 100 mL/hr over 30 Minutes Intravenous Every 24 hours 12/01/15 1826 12/03/15 1857      Results for orders placed or performed during the hospital encounter of 12/01/15 (from the past 48 hour(s))  Glucose, capillary     Status: Abnormal   Collection Time: 12/05/15  9:08 PM  Result Value Ref Range   Glucose-Capillary 107 (H) 65 - 99 mg/dL  Glucose, capillary     Status: Abnormal   Collection Time: 12/06/15  8:13 AM  Result Value Ref Range   Glucose-Capillary 109 (H) 65 - 99 mg/dL  Glucose, capillary     Status: Abnormal   Collection Time: 12/06/15 11:45 AM  Result Value Ref Range   Glucose-Capillary 118 (H) 65 - 99 mg/dL  Glucose, capillary     Status: Abnormal   Collection Time: 12/06/15  4:21 PM  Result Value Ref Range   Glucose-Capillary 119 (H) 65 - 99 mg/dL  Glucose, capillary     Status: Abnormal   Collection Time: 12/06/15  9:45 PM  Result Value Ref Range   Glucose-Capillary 106 (H) 65 - 99 mg/dL  Basic metabolic panel     Status: Abnormal   Collection Time: 12/07/15 12:15 AM  Result Value Ref Range   Sodium 147 (H) 135 - 145 mmol/L   Potassium 4.7 3.5 - 5.1 mmol/L   Chloride 110 101 - 111 mmol/L   CO2 25 22 - 32 mmol/L   Glucose, Bld 346 (H) 65 - 99 mg/dL   BUN 50 (H) 6 - 20 mg/dL   Creatinine, Ser 1.33 (H) 0.44 - 1.00  mg/dL   Calcium 8.9 8.9 - 10.3 mg/dL   GFR calc non Af Amer 39 (L) >60 mL/min   GFR calc Af Amer 45 (L) >60 mL/min    Comment: (NOTE) The eGFR has been calculated using the CKD EPI equation. This calculation has not been validated in all clinical situations. eGFR's persistently <60 mL/min signify possible Chronic Kidney Disease.    Anion gap 12 5 - 15  Glucose, capillary     Status: Abnormal   Collection Time: 12/07/15 12:44 AM  Result Value Ref Range   Glucose-Capillary 122 (H) 65 - 99 mg/dL  Glucose, capillary     Status: Abnormal   Collection Time: 12/07/15  4:09 AM  Result Value Ref Range   Glucose-Capillary 113 (H) 65 - 99 mg/dL  Glucose, capillary     Status: Abnormal   Collection Time: 12/07/15  7:43 AM  Result Value Ref Range   Glucose-Capillary 137 (H) 65 - 99 mg/dL    No results found.  Review of Systems  Unable to perform ROS: Patient unresponsive   Blood pressure (!) 137/92, pulse 62, temperature 97.7 F (36.5 C), temperature source Axillary, resp. rate 17, height 5' 4" (1.626 m), weight 54.2 kg (119 lb 7.8 oz), SpO2 100 %. Physical Exam  Constitutional: No distress.  Frail elderly woman, unresponsive, she looked up once when I was shaking her, but no other response to entire exam.    HENT:  Head: Normocephalic and atraumatic.  Eyes: Right eye exhibits no discharge. Left eye exhibits no discharge. No scleral icterus.  Neck: No JVD present. No tracheal deviation present. No thyromegaly present.  Cardiovascular: Normal rate, regular rhythm, normal heart sounds and intact distal pulses.  Respiratory: Effort normal. No respiratory distress. She has wheezes. She exhibits no tenderness.  GI: Soft. Bowel sounds are normal. She exhibits no distension and no mass. There is no tenderness. There is no rebound and no guarding.  Scar from the old gastrotomy tube site.   Musculoskeletal: She exhibits no edema or deformity.  Lymphadenopathy:    She has no cervical  adenopathy.  Neurological:  Pt unresponsive to entire exam and any questions.  Skin: Skin is warm and dry. No rash noted. She is not diaphoretic. No erythema. No pallor.  Psychiatric:  Unresponsive to any stimulus.      Assessment/Plan: Multiple CVA's with new large RMCA, prior LMCA, Meaning recovery is not expected AF now off anticoagulation secondary to new CVA and no oral route for drug Aphagia/dysphagia - currently NPO Prior gastrostomy tube 04/2014, removed some time this year Full Code per family request Fabry disease Hypertension Hx of tobacco abuse  Plan:  Dr Wendee Beavers would like [place] to consider gastrostomy tube for tube feedings.  No one is with the patient.   I called Mare Loan  Her son at home phone.  We discussed a nasogastric feeding tube, a possible PEG feeding tube, and surgical open feeding tube placement. I told him from a surgical standpoint we would consider trying to do a PEG tube first with the hope that the stomach is still stuck up to the abdominal wall from the previous feeding tube.  The second choice would be an open approach similar to tube placement similar procedure last year. Both of these procedures would require some anesthesia.  He is concerned she would not recover from the anesthesia.    He also ask about a feeding tube and I told him we could ask IR to try place one.  This would not require anesthesia, but is more difficult with the absence of any swallow.  He want to consider the options and he will be here tomorrow and talk to Dr. Grandville Silos.  She is currently NPO and just getting IV fluids.      , 12/07/2015, 1:18 PM

## 2015-12-08 DIAGNOSIS — E43 Unspecified severe protein-calorie malnutrition: Secondary | ICD-10-CM

## 2015-12-08 LAB — GLUCOSE, CAPILLARY
Glucose-Capillary: 115 mg/dL — ABNORMAL HIGH (ref 65–99)
Glucose-Capillary: 96 mg/dL (ref 65–99)

## 2015-12-08 MED ORDER — MORPHINE SULFATE (CONCENTRATE) 10 MG/0.5ML PO SOLN: 5.0000 mg | ORAL | 0 refills | Status: AC | PRN

## 2015-12-08 MED ORDER — LORAZEPAM 2 MG/ML PO CONC: 1.0000 mg | Freq: Three times a day (TID) | ORAL | 0 refills | Status: AC | PRN

## 2015-12-08 MED ORDER — ASPIRIN 300 MG RE SUPP: 300.0000 mg | Freq: Every day | RECTAL | 0 refills | Status: AC

## 2015-12-08 NOTE — Clinical Social Work Note (Signed)
Clinical Social Work Assessment  Patient Details  Name: Rebecca [name] MRN: [id number] Date of Birth: 28-Mar-1945  Date of referral:  12/06/15               Reason for consult:  Facility Placement                Permission sought to share information with:  Other Permission granted to share information::  No (Patient non-verbal)  Name::     Rebecca [name]  Agency::     Relationship::  Brother  Contact Information:  [phone]  Housing/Transportation Living arrangements for the past 2 months:  Skilled Nursing Facility [facility]) Source of Information:  Other (Comment Required) (Brother Rebecca [name]) Patient Interpreter Needed:  None Criminal Activity/Legal Involvement Pertinent to Current Situation/Hospitalization:  No - Comment as needed Significant Relationships:  Siblings Lives with:  Facility Resident ([facility]) Do you feel safe going back to the place where you live?  Yes Need for family participation in patient care:  Yes (Comment)  Care giving concerns:  None expressed by brother regarding care received at skilled nursing facility.  Social Worker assessment / plan:  CSW talked with patient's brother, Rebecca [name] on 9/18 and confirmed that patient would return to [facility] when ready for discharge.  Employment status:  Disabled (Comment on whether or not currently receiving Disability) Insurance information:  Medicare, Medicaid In [place] PT Recommendations:  Skilled Nursing Facility Information / Referral to community resources:  Other (Comment Required) (None needed or requested as patient from skilled facility)  Patient/Family's Response to care:  No concerns expressed to CSW regarding care during hospitalization.  Patient/Family's Understanding of and Emotional Response to Diagnosis, Current Treatment, and Prognosis:  Not discussed.  Emotional Assessment Appearance:  Appears stated age Attitude/Demeanor/Rapport:  Unresponsive (Patient is mute) Affect  (typically observed):  Unable to Assess Orientation:  Oriented to Self Alcohol / Substance use:  Tobacco Use, Alcohol Use, Illicit Drugs (Patient reports no tobacco, alcohol or illicit drug use) Psych involvement (Current and /or in the community):  No (Comment)  Discharge Needs  Concerns to be addressed:  Discharge Planning Concerns Readmission within the last 30 days:  Yes Current discharge risk:  None Barriers to Discharge:  No Barriers Identified   Rebecca Goldmann, LCSW 12/08/2015, 5:45 PM

## 2015-12-08 NOTE — Progress Notes (Signed)
Brief Nutrition Note  Per chart, family has decided to stop care. Pt will return to SNF. No g-tube or nutrition needed.  Current diet order is NPO.  Labs reviewed and include elevated Na, elevated BUN, and elevated creatinine. CBGs: 96-137 in past 24 hrs  Medications reviewed and include heparin and dextrose with sodium chloride and potassium chloride.  No additional nutrition needs at this time. Please re-consult RD if new needs arise.  Rosemarie Ax Dietetic Intern Pager Number: [phone]

## 2015-12-08 NOTE — Progress Notes (Signed)
Patient ID: Rebecca CarwinConstance E [name], female   DOB: 06/26/1945, 70 y.o.   MRN: [id number]  [facility] Progress Note     Subjective: Spoke with Dr. Gonzella Lexhungel, family has decided to stop all care and have patient return to SNF. She will not be needing g-tube or other source of nutrition.  Objective: Vital signs in last 24 hours: Temp:  [97.8 F (36.6 C)-98.9 F (37.2 C)] 98.7 F (37.1 C) (09/20 0442) Pulse Rate:  [64-87] 87 (09/20 0442) Resp:  [16-18] 16 (09/20 0442) BP: (121-154)/(76-102) 154/102 (09/20 0442) SpO2:  [95 %-100 %] 99 % (09/20 0442) Last BM Date: 12/01/15  Intake/Output from previous day: 09/19 0701 - 09/20 0700 In: 2100 [I.V.:2100] Out: 445 [Urine:445] Intake/Output this shift: No intake/output data recorded.   Lab Results:   Recent Labs  12/07/15 2017  WBC 4.9  HGB 10.6*  HCT 35.4*  PLT 227   BMET  Recent Labs  12/07/15 0015  NA 147*  K 4.7  CL 110  CO2 25  GLUCOSE 346*  BUN 50*  CREATININE 1.33*  CALCIUM 8.9   PT/INR  Recent Labs  12/07/15 2017  LABPROT 18.3*  INR 1.50   CMP     Component Value Date/Time   NA 147 (H) 12/07/2015 0015   NA 142 03/30/2015   K 4.7 12/07/2015 0015   CL 110 12/07/2015 0015   CO2 25 12/07/2015 0015   GLUCOSE 346 (H) 12/07/2015 0015   BUN 50 (H) 12/07/2015 0015   BUN 41 (A) 03/30/2015   CREATININE 1.33 (H) 12/07/2015 0015   CALCIUM 8.9 12/07/2015 0015   PROT 8.5 (H) 12/05/2015 1101   ALBUMIN 3.5 12/05/2015 1101   AST 56 (H) 12/05/2015 1101   ALT 27 12/05/2015 1101   ALKPHOS 39 12/05/2015 1101   BILITOT 0.9 12/05/2015 1101   GFRNONAA 39 (L) 12/07/2015 0015   GFRAA 45 (L) 12/07/2015 0015   Lipase  No results found for: LIPASE     Studies/Results: No results found.  Anti-infectives: Anti-infectives    Start     Dose/Rate Route Frequency Ordered Stop   12/05/15 2200  levofloxacin (LEVAQUIN) tablet 750 mg  Status:  Discontinued     750 mg Oral Every 48 hours 12/05/15 1130 12/05/15  1159   12/05/15 1300  levofloxacin (LEVAQUIN) IVPB 750 mg     750 mg 100 mL/hr over 90 Minutes Intravenous Every 48 hours 12/05/15 1159     12/04/15 2000  levofloxacin (LEVAQUIN) tablet 500 mg  Status:  Discontinued     500 mg Oral Every 24 hours 12/03/15 1959 12/05/15 1130   12/03/15 2100  levofloxacin (LEVAQUIN) IVPB 500 mg     500 mg 100 mL/hr over 60 Minutes Intravenous  Once 12/03/15 1959 12/03/15 2318   12/03/15 1915  levofloxacin (LEVAQUIN) tablet 500 mg  Status:  Discontinued     500 mg Oral Every 24 hours 12/03/15 1857 12/03/15 1959   12/02/15 0800  vancomycin (VANCOCIN) 500 mg in sodium chloride 0.9 % 100 mL IVPB  Status:  Discontinued     500 mg 100 mL/hr over 60 Minutes Intravenous Every 12 hours 12/01/15 1912 12/01/15 1932   12/02/15 0800  vancomycin (VANCOCIN) 500 mg in sodium chloride 0.9 % 100 mL IVPB  Status:  Discontinued     500 mg 100 mL/hr over 60 Minutes Intravenous Every 12 hours 12/01/15 1932 12/03/15 1857   12/01/15 1900  vancomycin (VANCOCIN) IVPB 1000 mg/200 mL premix     1,000  mg 200 mL/hr over 60 Minutes Intravenous  Once 12/01/15 1848 12/01/15 2047   12/01/15 1845  ceFEPIme (MAXIPIME) 2 g in dextrose 5 % 50 mL IVPB  Status:  Discontinued     2 g 100 mL/hr over 30 Minutes Intravenous Every 24 hours 12/01/15 1826 12/03/15 1857       Assessment/Plan Multiple CVA's with new large RMCA, prior LMCA, Meaning recovery is not expected AF now off anticoagulation secondary to new CVA and no oral route for drug Aphagia/dysphagia - currently NPO Prior gastrostomy tube 04/2014, removed some time this year Full Code per family request Fabry disease Hypertension Hx of tobacco abuse  Plan - per Dr. Gonzella Lex family has decided to stop all care and have patient return to SNF. She will not be needing g-tube or other source of nutrition. General surgery will sign off.   LOS: 6 days    Edson Snowball , [facility] 12/08/2015, 11:02 AM Pager:  [phone] Consults: [phone] Mon-Fri 7:00 am-4:30 pm Sat-Sun 7:00 am-11:30 am

## 2015-12-08 NOTE — Clinical Social Work Note (Signed)
Ms. Harvell medically stable for discharge back to [facility] today. Facility notified and discharge clinicals transmitted to facility. Patient's brother [name] aware of discharge. Patient will be transported back to [facility] via ambulance.  Genelle Bal, MSW, LCSW Licensed Clinical Social Worker Clinical Social Work Department [employer] [phone]

## 2015-12-08 NOTE — Discharge Summary (Signed)
Physician Discharge Summary  Rebecca [name] YTK:[id number] DOB: 13-Jun-1945 DOA: 12/01/2015  PCP: Inocencio Homes, MD  Admit date: 12/01/2015 Discharge date: 12/08/2015  Admitted From: Skilled nursing facility [facility]) Disposition: Return to skilled nursing facility [facility]) with hospice follow-up.  Recommendations for Outpatient Follow-up:  Patient is being discharged back to skilled nursing facility with recommendation for hospice care and going for comfort. Extensive discussion with son [name] (patient's  healthcare POA ) at bedside on the day of discharge.  He wishes patient to be made fairly comfortable, does not want any resuscitation or measures that does not improve quality of life, including placement of a feeding tube or rehospitalization.     Equipment/Devices: Oxygen and Foley for comfort  Discharge Condition:Guarded CODE STATUS:DO NOT RESUSCITATE Diet recommendation: Nothing by mouth. Comfort feeds if tolerated    Discharge Diagnoses:  Principal Problem:   Cerebral thrombosis with cerebral infarction   Active Problems:   Acute respiratory failure with hypoxia (HCC)   HCAP (healthcare-associated pneumonia)   Acute encephalopathy   End of life care   Lactic acidemia   Protein-calorie malnutrition, severe  Brief narrative/history of present illness 70 year old female with history of multiple strokes, paroxysmal A. 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 In the ED she was found to be in acute hypoxic respiratory failure with chest x-ray showing pneumonia. Given her overall poor prognosis goals of care discussions were done and palliative care consulted. Patient and family initially opted for DO NOT RESUSCITATE and possible hospice however they refused subsequently. (As per son, when he met the patient 3 days back she did  not want to be DO NOT RESUSCITATE). Patient was being treated for pneumonia but on 9/17 she became increasingly confused with workup showing a moderate to large left MCA territory infarct with hemorrhagic transformation. Neurology was consulted. Given no clear onset of symptom she did not received IV TPA.  Hospital course Acute right MCA territory infarct with hemorrhagic transformation Patient is now aphasic with right sided weakness. Overall prognosis is extremely guarded. Placed on full dose aspirin suppository. Unable to take any medications by mouth. Discontinued anticoagulation. 2-D echo with EF of 45-50%. No further recommendations per neurology and suggests very poor prognosis.[name]  Dysphagia Son initially wanted of feeding tube. However when explained that this will be done by surgery (given prior gastrostomy tube placement by surgery due to unsafe fluoroscopic window on prior attempt by IR) and would require anesthesia, patient's son refused to have a feeding tube placed in. He tells me that he was with the impression that patient would be discharged with an NG tube. Patient's son does not want her to receive anesthesia or undergo a surgical procedure that would cause her discomfort. He understands that patient will have no means of nutrition but wants her to be comfortable and have a better quality of life. He agrees that patient can have comfort feeds if tolerated.  Goals of care After a long discussion with patient's son at bedside he wants her to be made for comfort and be discharged back to skilled nursing facility where he is very well taken care. I will resume her home dose of Ativan and morphine for comfort. Continue oxygen for comfort. Continue Foley upon discharge for comfort.  Healthcare associated pneumonia/acute hypoxic respiratory failure Completed 7 days of antibiotic course  Family communication: Son at bedside  disposition: Skilled  nursing facility with hospice  follow-up  Discharge Instructions     Medication List    STOP taking these medications   aspirin 81 MG EC tablet Replaced by:  aspirin 300 MG suppository   ELIQUIS 5 MG Tabs tablet Generic drug:  apixaban   metoprolol tartrate 25 MG tablet Commonly known as:  LOPRESSOR   mirtazapine 7.5 MG tablet Commonly known as:  REMERON   sertraline 50 MG tablet Commonly known as:  ZOLOFT     TAKE these medications   acetaminophen 650 MG suppository Commonly known as:  TYLENOL Place 1 suppository (650 mg total) rectally every 6 (six) hours as needed for mild pain (or Fever >/= 101). What changed:  Another medication with the same name was removed. Continue taking this medication, and follow the directions you see here.   aspirin 300 MG suppository Place 1 suppository (300 mg total) rectally daily. Replaces:  aspirin 81 MG EC tablet   bisacodyl 10 MG suppository Commonly known as:  DULCOLAX Place 1 suppository (10 mg total) rectally as needed for moderate constipation.   CALAZIME SKIN PROTECTANT EX Apply 1 application topically 2 (two) times daily. TO BUTTOCKS   LORazepam 2 MG/ML concentrated solution Commonly known as:  ATIVAN Take 0.5 mLs (1 mg total) by mouth every 8 (eight) hours as needed for anxiety, sedation or sleep (nausea).   morphine CONCENTRATE 10 MG/0.5ML Soln concentrated solution Place 0.25 mLs (5 mg total) under the tongue every 2 (two) hours as needed for moderate pain or shortness of breath.      Follow-up Information    MD at SNF in 1 week .        hospice / palliaitve care follow up in 1 week .          Allergies  Allergen Reactions  . Lisinopril Swelling    Consultations:  Palliative care  Neurology   Procedures/Studies: Ct Angio Head W Or Wo Contrast  Result Date: 11/27/2015 CLINICAL DATA:  Initial evaluation for acute stroke. EXAM: CT ANGIOGRAPHY HEAD AND NECK TECHNIQUE: Multidetector CT imaging of the head and neck was performed  using the standard protocol during bolus administration of intravenous contrast. Multiplanar CT image reconstructions and MIPs were obtained to evaluate the vascular anatomy. Carotid stenosis measurements (when applicable) are obtained utilizing NASCET criteria, using the distal internal carotid diameter as the denominator. CONTRAST:  50 cc of Isovue 370. COMPARISON:  Prior MRI from 11/26/2015. FINDINGS: CT HEAD Age-related cerebral atrophy present. Extensive chronic microvascular ischemic changes again seen. Scattered areas of encephalomalacia involving fourth cerebral hemispheres compatible with remote infarcts. Remote bilateral cerebellar infarcts present. Prominent vascular calcifications within the carotid siphons and vertebrobasilar system. Known small punctate acute infarcts not seen. No other acute large vessel territory infarct. No acute intracranial hemorrhage. No mass lesion, midline shift, or mass effect. No hydrocephalus. No extra-axial fluid collection. Scalp soft tissues demonstrate no acute abnormality. No acute abnormality about the globes and orbits. The opacification with expansion of the left sphenoid sinus, likely related to mucocele. Paranasal sinuses are otherwise clear. No mastoid effusion. Calvarium intact. CTA NECK Aortic arch: Study limited by motion artifact. Visualized aortic arch of normal caliber with normal branch pattern. No high-grade stenosis at the origin of the great vessels. Scattered atheromatous plaque within the arch itself. Visualized subclavian arteries patent. Right carotid system: Right common carotid artery patent from its origin to the bifurcation without stenosis. Vessel tortuosity noted. Mild plaque about the right bifurcation without stenosis. Right ICA widely  patent to the skullbase without stenosis, dissection, or occlusion. Left carotid system: Left common carotid artery patent from its origin to the bifurcation without high-grade stenosis. Mild plaque about the  left bifurcation/ proximal left ICA without high-grade stenosis. Left ICA mildly tortuous but patent distally to the skullbase without stenosis, dissection, or occlusion. Vertebral arteries:Both vertebral arteries arise from the subclavian arteries. Proximal vertebral arteries not well evaluated on this exam due to motion artifact. Vertebral arteries patent to the skullbase without obvious stenosis. Skeleton: Advanced multilevel degenerative spondylolysis within the cervical spine. No definite acute osseous abnormality. No worrisome lytic or blastic osseous lesions. Other neck: Visualized lungs are clear. Mild emphysema. Visualized mediastinum grossly unremarkable. Thyroid grossly normal. No definite acute soft tissue abnormality within the neck, although evaluation limited by motion. CTA HEAD Anterior circulation: Petrous segments patent bilaterally. Multifocal calcified plaque present within the cavernous/ supraclinoid ICAs bilaterally, left worse than right. There is associated moderate multi focal narrowing on the right (approximately up to 50% or so). Moderate to severe multi focal narrowing present on the left (50-75%). A1 segments patent. Right A1 segment slightly diminutive. Anterior communicating artery normal. Anterior cerebral arteries demonstrate multifocal atheromatous irregularity with stenoses, particularly on the right where the distal vessel is attenuated as compared to the left. M1 segments patent without stenosis or occlusion. MCA bifurcations normal. MCA branches patent proximally, and grossly symmetric. Distal small vessel atheromatous irregularity. Posterior circulation: Left vertebral artery dominant. Mild focal plaque within the left V4 segment without significant stenosis. Focal plaque within the diminutive right vertebral artery as it crosses the dural margin without significant stenosis. Additional focal plaque distally within the right V4 segment with mild to moderate stenosis. Right  posterior inferior cerebral artery patent. Left posterior inferior cerebral artery not well visualized. Basilar artery tortuous but patent to its distal aspect. Focal plaque within the proximal- mid basilar artery with moderate stenosis (series 502, image 352). Superior cerebral arteries patent. Posterior cerebral arteries arise from the basilar artery and are opacified to their distal aspects. Venous sinuses: Grossly patent, although not well evaluated on this exam due to motion artifact and timing of the contrast bolus. Anatomic variants: No anatomic variant. No aneurysm or vascular malformation. Delayed phase: No pathologic enhancement. IMPRESSION: CTA NECK IMPRESSION: 1. Mild atheromatous plaque about the carotid bifurcations bilaterally, left greater than right, without flow limiting stenosis. 2. Patent vertebral arteries within the neck. 3. Vessel tortuosity, suggestive of chronic underlying hypertension. 4. Mild emphysema. CTA HEAD IMPRESSION: 1. Negative for large vessel occlusion. 2. Multifocal atheromatous disease involving the anterior and posterior circulations as detailed above. Areas of involvement greatest within the cavernous ICAs, anterior cerebral arteries, and basilar artery. Electronically Signed   By: Jeannine Boga M.D.   On: 11/27/2015 06:20   Ct Head Wo Contrast  Result Date: 12/05/2015 CLINICAL DATA:  Pt was found unresponsive on the floor of her home time down unknown She has left side weakness EXAM: CT HEAD WITHOUT CONTRAST TECHNIQUE: Contiguous axial images were obtained from the base of the skull through the vertex without intravenous contrast. COMPARISON:  12/01/2015 FINDINGS: Brain: No intracranial hemorrhage. No parenchymal contusion. No midline shift or mass effect. Basilar cisterns are patent. No skull base fracture. No fluid in the paranasal sinuses or mastoid air cells. Orbits are normal. New cortical hypodensity in the RIGHT frontotemporal lobe (image 19, series 201)  within the territory of the RIGHT middle cerebral artery. Remote infarction within the LEFT middle cerebral artery territory again noted. Extensive periventricular  subcortical white matter hypodensities unchanged. Atrophy and proportional ventricular dilatation unchanged Vascular: Vascular calcification of the circle Willis vessels. Skull:  No skull fracture Sinuses/Orbits: Paranasal sinuses and mastoid air cells are clear. Orbits are clear. Other: None IMPRESSION: 1. New RIGHT middle cerebral artery territory infarction. 2. Remote LEFT middle cerebral artery territory infarction. 3. Extensive white matter microvascular disease. These results will be called to the ordering clinician or representative by the Radiologist Assistant, and communication documented in the PACS or zVision Dashboard. Electronically Signed   By: Suzy Bouchard M.D.   On: 12/05/2015 08:35   Ct Head Wo Contrast  Result Date: 12/01/2015 CLINICAL DATA:  Altered mental status. EXAM: CT HEAD WITHOUT CONTRAST TECHNIQUE: Contiguous axial images were obtained from the base of the skull through the vertex without intravenous contrast. COMPARISON:  CT brain 11/27/2015 FINDINGS: Brain: No evidence of acute infarction, hemorrhage, extra-axial collection, ventriculomegaly, or mass effect. Old left frontal lobe infarct with encephalomalacia. Old right posterior parietal lobe infarct. Generalized cerebral atrophy. Periventricular white matter low attenuation likely secondary to microangiopathy. Vascular: Cerebrovascular atherosclerotic calcifications are noted. Skull: Negative for fracture or focal lesion. Sinuses/Orbits: Visualized portions of the orbits are unremarkable. Visualized portions of the paranasal sinuses and mastoid air cells are unremarkable. Other: None. IMPRESSION: 1. No acute intracranial pathology. Electronically Signed   By: Kathreen Devoid   On: 12/01/2015 12:51   Ct Head Wo Contrast  Result Date: 11/25/2015 CLINICAL DATA:   Right-sided facial droop EXAM: CT HEAD WITHOUT CONTRAST TECHNIQUE: Contiguous axial images were obtained from the base of the skull through the vertex without intravenous contrast. COMPARISON:  October 11, 2015 FINDINGS: Brain: Mild diffuse atrophy remain stable. There is no intracranial mass, hemorrhage, extra-axial fluid collection, or midline shift. There is extensive small vessel disease throughout the centra semiovale bilaterally. Small vessel disease is noted in each thalamus. There is evidence of a prior infarct in the superior posterior left frontal lobe. There is evidence of a prior infarct on the right at the parieto-occipital junction. There is evidence of a prior small infarct in the posterior superior right cerebellum. No new gray-white compartment lesions are evident. No acute infarct is demonstrable compared to prior study. Vascular: There is no hyperdense vessel appreciable. There are focal areas of calcification in the carotid siphons bilaterally as well as in the distal vertebral artery bilaterally and basilar artery regions. Skull: The bony calvarium appears intact. Sinuses/Orbits: Orbits appear symmetric bilaterally. There is opacification in the left sphenoid sinus. Other visualized paranasal sinuses are clear. Other: Mastoid air cells are clear. IMPRESSION: Atrophy with prior infarcts and extensive small vessel disease, stable. No acute infarct evident. No intracranial mass, hemorrhage, or extra-axial fluid collection. There are multiple foci of arterial vascular calcification. There is sphenoid sinus opacification. Electronically Signed   By: Lowella Grip III M.D.   On: 11/25/2015 11:28   Ct Angio Neck W Or Wo Contrast  Result Date: 11/27/2015 CLINICAL DATA:  Initial evaluation for acute stroke. EXAM: CT ANGIOGRAPHY HEAD AND NECK TECHNIQUE: Multidetector CT imaging of the head and neck was performed using the standard protocol during bolus administration of intravenous contrast.  Multiplanar CT image reconstructions and MIPs were obtained to evaluate the vascular anatomy. Carotid stenosis measurements (when applicable) are obtained utilizing NASCET criteria, using the distal internal carotid diameter as the denominator. CONTRAST:  50 cc of Isovue 370. COMPARISON:  Prior MRI from 11/26/2015. FINDINGS: CT HEAD Age-related cerebral atrophy present. Extensive chronic microvascular ischemic changes again seen. Scattered areas  of encephalomalacia involving fourth cerebral hemispheres compatible with remote infarcts. Remote bilateral cerebellar infarcts present. Prominent vascular calcifications within the carotid siphons and vertebrobasilar system. Known small punctate acute infarcts not seen. No other acute large vessel territory infarct. No acute intracranial hemorrhage. No mass lesion, midline shift, or mass effect. No hydrocephalus. No extra-axial fluid collection. Scalp soft tissues demonstrate no acute abnormality. No acute abnormality about the globes and orbits. The opacification with expansion of the left sphenoid sinus, likely related to mucocele. Paranasal sinuses are otherwise clear. No mastoid effusion. Calvarium intact. CTA NECK Aortic arch: Study limited by motion artifact. Visualized aortic arch of normal caliber with normal branch pattern. No high-grade stenosis at the origin of the great vessels. Scattered atheromatous plaque within the arch itself. Visualized subclavian arteries patent. Right carotid system: Right common carotid artery patent from its origin to the bifurcation without stenosis. Vessel tortuosity noted. Mild plaque about the right bifurcation without stenosis. Right ICA widely patent to the skullbase without stenosis, dissection, or occlusion. Left carotid system: Left common carotid artery patent from its origin to the bifurcation without high-grade stenosis. Mild plaque about the left bifurcation/ proximal left ICA without high-grade stenosis. Left ICA mildly  tortuous but patent distally to the skullbase without stenosis, dissection, or occlusion. Vertebral arteries:Both vertebral arteries arise from the subclavian arteries. Proximal vertebral arteries not well evaluated on this exam due to motion artifact. Vertebral arteries patent to the skullbase without obvious stenosis. Skeleton: Advanced multilevel degenerative spondylolysis within the cervical spine. No definite acute osseous abnormality. No worrisome lytic or blastic osseous lesions. Other neck: Visualized lungs are clear. Mild emphysema. Visualized mediastinum grossly unremarkable. Thyroid grossly normal. No definite acute soft tissue abnormality within the neck, although evaluation limited by motion. CTA HEAD Anterior circulation: Petrous segments patent bilaterally. Multifocal calcified plaque present within the cavernous/ supraclinoid ICAs bilaterally, left worse than right. There is associated moderate multi focal narrowing on the right (approximately up to 50% or so). Moderate to severe multi focal narrowing present on the left (50-75%). A1 segments patent. Right A1 segment slightly diminutive. Anterior communicating artery normal. Anterior cerebral arteries demonstrate multifocal atheromatous irregularity with stenoses, particularly on the right where the distal vessel is attenuated as compared to the left. M1 segments patent without stenosis or occlusion. MCA bifurcations normal. MCA branches patent proximally, and grossly symmetric. Distal small vessel atheromatous irregularity. Posterior circulation: Left vertebral artery dominant. Mild focal plaque within the left V4 segment without significant stenosis. Focal plaque within the diminutive right vertebral artery as it crosses the dural margin without significant stenosis. Additional focal plaque distally within the right V4 segment with mild to moderate stenosis. Right posterior inferior cerebral artery patent. Left posterior inferior cerebral artery  not well visualized. Basilar artery tortuous but patent to its distal aspect. Focal plaque within the proximal- mid basilar artery with moderate stenosis (series 502, image 352). Superior cerebral arteries patent. Posterior cerebral arteries arise from the basilar artery and are opacified to their distal aspects. Venous sinuses: Grossly patent, although not well evaluated on this exam due to motion artifact and timing of the contrast bolus. Anatomic variants: No anatomic variant. No aneurysm or vascular malformation. Delayed phase: No pathologic enhancement. IMPRESSION: CTA NECK IMPRESSION: 1. Mild atheromatous plaque about the carotid bifurcations bilaterally, left greater than right, without flow limiting stenosis. 2. Patent vertebral arteries within the neck. 3. Vessel tortuosity, suggestive of chronic underlying hypertension. 4. Mild emphysema. CTA HEAD IMPRESSION: 1. Negative for large vessel occlusion. 2. Multifocal atheromatous  disease involving the anterior and posterior circulations as detailed above. Areas of involvement greatest within the cavernous ICAs, anterior cerebral arteries, and basilar artery. Electronically Signed   By: Jeannine Boga M.D.   On: 11/27/2015 06:20   Mr Brain Wo Contrast  Result Date: 12/05/2015 CLINICAL DATA:  Acute change in mental status, heart failure, shortness of breath, atrial fibrillation, history of seizures. EXAM: MRI HEAD WITHOUT CONTRAST TECHNIQUE: Multiplanar, multiecho pulse sequences of the brain and surrounding structures were obtained without intravenous contrast. COMPARISON:  MRI brain 11/26/2015. FINDINGS: Brain: Since the previous MR, the patient has developed a large area of confluent infarction in the RIGHT frontal and posterior frontal regions, affecting the cortex and white matter involving the frontal operculum, superior insula, and posterior frontal cortex almost to the vertex. A second smaller area of confluent RIGHT MCA territory ischemia  affects the RIGHT parietal lobe. Resolving areas of punctate micro embolic acute infarction on the LEFT as demonstrated on the previous MR from 11/26/2015. No brainstem or posterior fossa involvement. Early hemorrhagic transformation of the acute infarction as demonstrated on gradient sequence. No confluent lobar hematoma. Global atrophy with chronic microvascular ischemic change. Widespread areas of chronic infarction, most notable in the LEFT MCA territory affecting the frontal lobe. Chronic areas of infarction are redemonstrated in the cerebellum, occipital lobes, and elsewhere in the RIGHT hemisphere. 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 Vascular: Flow voids are maintained. Thick-walled dolichoectatic basilar artery is patent. Skull and upper cervical spine: Partial empty sella. No tonsillar herniation. Advanced cervical spondylosis. Sinuses/Orbits: Conjugate gaze to the RIGHT. BILATERAL cataract extraction. Slight layering fluid in the LEFT posterior ethmoids. Other: None. IMPRESSION: Acute regions of cerebral infarction within the RIGHT MCA territory, with the largest confluent area involving a RIGHT frontal and posterior frontal cortex and subcortical white matter. Early hemorrhagic transformation, gyriform/petechial, as seen on gradient sequence. These changes have developed since the previous MR of 11/26/2015. A call has been placed to the ordering provider to discuss the findings. Electronically Signed   By: Staci Righter M.D.   On: 12/05/2015 10:56   Mr Brain Wo Contrast  Result Date: 11/26/2015 CLINICAL DATA:  Previous history of stroke. Right facial droop and mental status changes beginning yesterday. EXAM: MRI HEAD WITHOUT CONTRAST TECHNIQUE: Multiplanar, multiecho pulse sequences of the brain and surrounding structures were obtained without intravenous contrast. COMPARISON:  Head CT 11/25/2015.  MRI 04/02/2014.  FINDINGS: Brain: The study suffers from motion degradation. There are 3 or 4 scattered punctate foci of restricted diffusion in the left hemisphere in the middle cerebral artery territory consistent with micro embolic infarctions. No large vessel territory acute infarction. The brainstem is normal. There are old small vessel cerebellar infarctions. There is extensive chronic small-vessel ischemic change affecting the thalamotomy, basal ganglia and throughout the cerebral hemispheric white matter. There is an old cortical infarction at the left insula and posterior frontal region. There is old cortical infarction in both occipital regions right more than left. There is old cortical infarction in the right frontal region. No evidence of mass lesion. No sign of acute hemorrhage. There are scattered foci of hemosiderin deposition related to the old infarctions. No hydrocephalus. No extra-axial collection. No pituitary mass. Vascular: Major vessels at the base of the brain show flow. Skull and upper cervical spine: Negative Sinuses/Orbits: No significant sinus disease. No visible orbital pathology. Other: None IMPRESSION: Three or 4 scattered punctate acute infarctions in the left MCA territory consistent  with micro embolic infarctions. No large infarction. No swelling or acute hemorrhage. 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 Extensive chronic small vessel ischemic changes elsewhere throughout the brain. Electronically Signed   By: Nelson Chimes M.D.   On: 11/26/2015 12:57   Dg Chest Port 1 View  Result Date: 12/01/2015 CLINICAL DATA:  Acute respiratory failure.  Atrial fibrillation. EXAM: PORTABLE CHEST 1 VIEW COMPARISON:  12/01/2015 FINDINGS: Stable moderate cardiomegaly.  Aortic atherosclerosis. Mild airspace disease is again seen in the lateral right lung base. No evidence of pulmonary edema or pleural effusion. IMPRESSION: No significant change in mild right basilar  airspace opacity, suspicious for pneumonia. Stable cardiomegaly.  No evidence of congestive heart failure. Aortic atherosclerosis. Electronically Signed   By: Earle Gell M.D.   On: 12/01/2015 17:59   Dg Chest Portable 1 View  Result Date: 12/01/2015 CLINICAL DATA:  Altered mental status, low oxygen saturation, recent stroke and hospitalization, history diabetes mellitus, hypertension, atrial fibrillation, smoking EXAM: PORTABLE CHEST 1 VIEW COMPARISON:  Portable exam 1258 hours compared to 11/25/2015 FINDINGS: Enlargement of cardiac silhouette with pulmonary vascular congestion. Tortuous thoracic aorta with atherosclerotic calcification. Bibasilar subsegmental atelectasis greater on RIGHT. Lungs otherwise clear. No pleural effusion or pneumothorax. Probable BILATERAL chronic rotator cuff tears. IMPRESSION: Enlargement of cardiac silhouette with pulmonary vascular congestion. Bibasilar subsegmental atelectasis greater on RIGHT. Electronically Signed   By: Lavonia Dana M.D.   On: 12/01/2015 13:17   Dg Chest Port 1 View  Result Date: 11/25/2015 CLINICAL DATA:  Weakness, altered mental status, history of diabetes, atrial fibrillation, previous CVA, current smoker. EXAM: PORTABLE CHEST 1 VIEW COMPARISON:  Chest x-ray of April 02, 2014 FINDINGS: The lungs are adequately inflated. There is increased density at the right lung base. There is no pleural effusion. The cardiac silhouette is enlarged but stable. The pulmonary vascularity is normal. There is calcification in the wall of the thoracic aorta. The bony thorax exhibits no acute abnormality. IMPRESSION: Right lower lobe atelectasis or pneumonia. Followup PA and lateral chest X-ray is recommended in 3-4 weeks following trial of antibiotic therapy to ensure resolution and exclude underlying malignancy. Stable cardiomegaly without pulmonary vascular congestion. Aortic atherosclerosis. Electronically Signed   By: David  Martinique M.D.   On: 11/25/2015 11:04   Mr  Jodene Nam Head/brain KV Cm  Result Date: 11/27/2015 CLINICAL DATA:  Acute left MCA territory infarcts.  Abnormal MRI. EXAM: MRA HEAD WITHOUT CONTRAST TECHNIQUE: Angiographic images of the Circle of Willis were obtained using MRA technique without intravenous contrast. COMPARISON:  MRI brain 11/26/2015.  CTA head and neck 11/27/2015 FINDINGS: The study is moderately degraded by patient motion. There is good signal within the terminal ICA bilaterally. The A1 and M1 segments are intact. ACA and MCA branch vessels are poorly visualized. The proximal basilar artery is obscured. The left vertebral artery is the dominant vessel. Distal basilar artery is intact. Both posterior cerebral arteries originate the basilar tip. There is moderate attenuation of PCA branch vessels. IMPRESSION: 1. Moderate medium and distal small vessel disease as previously seen. 2. Findings are significantly exaggerated by severe patient motion. 3. Accounting for artifact, no significant proximal stenosis, aneurysm, or branch vessel occlusion is evident. Electronically Signed   By: San Morelle M.D.   On: 11/27/2015 12:44    (Echo, Carotid, EGD, Colonoscopy, ERCP)    Subjective:  noted to have 5 runs of NSVT  Discharge Exam: Vitals:   12/08/15 0442 12/08/15 1000  BP: (!) 154/102 (!) 151/86  Pulse: 87 74  Resp: 16 20  Temp: 98.7 F (37.1 C) 98.4 F (36.9 C)   Vitals:   12/07/15 1715 12/07/15 2115 12/08/15 0442 12/08/15 1000  BP: 125/76 121/77 (!) 154/102 (!) 151/86  Pulse: 70 64 87 74  Resp: 18 16 16 20   Temp: 97.8 F (36.6 C) 98.9 F (37.2 C) 98.7 F (37.1 C) 98.4 F (36.9 C)  TempSrc: Axillary Oral Oral Oral  SpO2: 100% 95% 99% 98%  Weight:      Height:        General: Aphasic non-communicative HEENT: No pallor, dry mucosa, supple neck Cardiovascular: S1 and S2 irregular, no murmurs rub or gallop Respiratory: Clear bilaterally, no added sounds Abdominal: Soft, NT, ND, bowel sounds + Extremities: no edema,  no cyanosis CNS: Nonverbal, left sided weakness    The results of significant diagnostics from this hospitalization (including imaging, microbiology, ancillary and laboratory) are listed below for reference.     Microbiology: No results found for this or any previous visit (from the past 240 hour(s)).   Labs: BNP (last 3 results)  Recent Labs  11/25/15 1110 12/01/15 1233  BNP 1,953.5* >8,676.1*   Basic Metabolic Panel:  Recent Labs Lab 12/05/15 1101 12/07/15 0015  NA 147* 147*  K 4.8 4.7  CL 111 110  CO2 25 25  GLUCOSE 130* 346*  BUN 50* 50*  CREATININE 1.50* 1.33*  CALCIUM 9.6 8.9   Liver Function Tests:  Recent Labs Lab 12/05/15 1101  AST 56*  ALT 27  ALKPHOS 39  BILITOT 0.9  PROT 8.5*  ALBUMIN 3.5   No results for input(s): LIPASE, AMYLASE in the last 168 hours. No results for input(s): AMMONIA in the last 168 hours. CBC:  Recent Labs Lab 12/05/15 1101 12/07/15 2017  WBC 4.5 4.9  HGB 11.5* 10.6*  HCT 36.8 35.4*  MCV 80.3 82.7  PLT 183 227   Cardiac Enzymes: No results for input(s): CKTOTAL, CKMB, CKMBINDEX, TROPONINI in the last 168 hours. BNP: Invalid input(s): POCBNP CBG:  Recent Labs Lab 12/07/15 1137 12/07/15 1635 12/07/15 2112 12/08/15 0040 12/08/15 0441  GLUCAP 135* 126* 101* 96 115*   D-Dimer No results for input(s): DDIMER in the last 72 hours. Hgb A1c No results for input(s): HGBA1C in the last 72 hours. Lipid Profile No results for input(s): CHOL, HDL, LDLCALC, TRIG, CHOLHDL, LDLDIRECT in the last 72 hours. Thyroid function studies No results for input(s): TSH, T4TOTAL, T3FREE, THYROIDAB in the last 72 hours.  Invalid input(s): FREET3 Anemia work up No results for input(s): VITAMINB12, FOLATE, FERRITIN, TIBC, IRON, RETICCTPCT in the last 72 hours. Urinalysis    Component Value Date/Time   COLORURINE YELLOW 12/01/2015 1715   APPEARANCEUR CLOUDY (A) 12/01/2015 1715   LABSPEC 1.007 12/01/2015 1715   PHURINE 5.0  12/01/2015 1715   GLUCOSEU NEGATIVE 12/01/2015 1715   HGBUR NEGATIVE 12/01/2015 [place] 12/01/2015 1715   KETONESUR NEGATIVE 12/01/2015 1715   PROTEINUR NEGATIVE 12/01/2015 1715   UROBILINOGEN 1.0 04/12/2014 0102   NITRITE NEGATIVE 12/01/2015 1715   LEUKOCYTESUR NEGATIVE 12/01/2015 1715   Sepsis Labs Invalid input(s): PROCALCITONIN,  WBC,  LACTICIDVEN Microbiology No results found for this or any previous visit (from the past 240 hour(s)).   Time coordinating discharge: Over 30 minutes  SIGNED:   Louellen Molder, MD  [facility] 12/08/2015, 1:35 PM Pager   If 7PM-7AM, please contact night-coverage [email or web address] Password [email or web address]

## 2015-12-09 ENCOUNTER — Encounter: Payer: Self-pay | Admitting: Internal Medicine

## 2015-12-09 ENCOUNTER — Non-Acute Institutional Stay (SKILLED_NURSING_FACILITY): Payer: Medicare Other | Admitting: Internal Medicine

## 2015-12-09 DIAGNOSIS — I480 Paroxysmal atrial fibrillation: Secondary | ICD-10-CM | POA: Diagnosis not present

## 2015-12-09 DIAGNOSIS — E1149 Type 2 diabetes mellitus with other diabetic neurological complication: Secondary | ICD-10-CM

## 2015-12-09 DIAGNOSIS — I5042 Chronic combined systolic (congestive) and diastolic (congestive) heart failure: Secondary | ICD-10-CM | POA: Diagnosis not present

## 2015-12-09 DIAGNOSIS — I63412 Cerebral infarction due to embolism of left middle cerebral artery: Secondary | ICD-10-CM

## 2015-12-09 DIAGNOSIS — R627 Adult failure to thrive: Secondary | ICD-10-CM

## 2015-12-09 DIAGNOSIS — I69391 Dysphagia following cerebral infarction: Secondary | ICD-10-CM | POA: Diagnosis not present

## 2015-12-09 DIAGNOSIS — D638 Anemia in other chronic diseases classified elsewhere: Secondary | ICD-10-CM

## 2015-12-09 DIAGNOSIS — J9601 Acute respiratory failure with hypoxia: Secondary | ICD-10-CM | POA: Diagnosis not present

## 2015-12-09 DIAGNOSIS — E7521 Fabry (-Anderson) disease: Secondary | ICD-10-CM

## 2015-12-09 NOTE — Progress Notes (Signed)
Patient ID: Rebecca [name], female   DOB: 13-Sep-1945, 70 y.o.   MRN: [id number]    HISTORY AND PHYSICAL   DATE: 12/09/2015  Location:    [place] Room Number: 111 A Place of Service: SNF (31)   Extended Emergency Contact Information Primary Emergency Contact: [place] Phone: [phone] Relation: Son Secondary Emergency Contact: Recardo Evangelist [place] of [place] Mobile Phone: [phone] Relation: None  Advanced Directive information Does patient have an advance directive?: No, Would patient like information on creating an advanced directive?: No - patient declined information  Chief Complaint  Patient presents with  . Readmit To SNF    HPI:  70 yo female long term resident seen today as a readmission into SNF following hospital stay 9/7th-12th for HCAP, elevated Troponin, concern for ACS vs demand ischemia, acute encephalopathy. MRI brain revealed 3-4 scattered punctate acute infarcts in left MCA; no significant stenosis. 2D echo showed EF 35-40% with diffuse hypokinesis. Palliative care recommended comfort care and she was sent back to SNF for that reason. Upon returning to SNF, family revoked comfort care. Pt experienced acute respiratory failure and was taken back to hospital. She was then admitted 9/13th-20th with acute respiratory faiulure with hypoxia, HCAP, acute encephalopathy, severe protein calorie malnutrition. She had worsening confusion. Neuro consulted. 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 Pt became aphasic with new stroke and she was made NPO. Palliative care again recommended comfort care and family agreed. She now presents to SNF with goal of comfort and family aware.  Pt is nonverbal and unable to participate in HPI. Hx obtained from chart. She is on ATC [place] O2 . No nursing issues. No falls. She is lethargic. She has morphine soln and ativan soln prn.    Past  Medical History:  Diagnosis Date  . Atrial fibrillation ([place]) 05/01/2013  . Depression   . Diabetes mellitus without complication ([place])   . Expressive aphasia 03/23/2013  . 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 Hypertension   . Stroke ([place])    2015  . Stroke [facility]) 2016   aphasia  . Tobacco abuse 05/01/2013    Past Surgical History:  Procedure Laterality Date  . CESAREAN SECTION     x3  . hip replacement Right 1990's  . hip replacemet Left 1990's  . LAPAROSCOPIC GASTROSTOMY N/A 04/20/2014   Procedure: LAPAROSCOPIC GASTROSTOMY TUBE PLACEMENT;  Surgeon: Ralene Ok, MD;  Location: [place];  Service: General;  Laterality: N/A;  . PEG tube placemnt  04/15/14  . TEE WITHOUT CARDIOVERSION N/A 03/26/2013   Procedure: TRANSESOPHAGEAL ECHOCARDIOGRAM (TEE);  Surgeon: Sanda Klein, MD;  Location: [place];  Service: Cardiovascular;  Laterality: N/A;  . TOOTH EXTRACTION Right 1/17   lower canine    Patient Care Team: Gildardo Cranker, DO as PCP - General (Internal Medicine) Gerlene Fee, NP as Nurse Practitioner ([place]) [facility] ([place])  Social History   Social History  . Marital status: Single    Spouse name: N/A  . Number of children: N/A  . Years of education: N/A   Occupational History  . Not on file.   Social History Main Topics  . Smoking status: Never Smoker  . Smokeless tobacco: Never Used  . Alcohol use No  . Drug use: No  . Sexual activity: Not Currently   Other Topics Concern  . Not on file   Social History Narrative  .  No narrative on file     reports that she has never smoked. She has never used smokeless tobacco. She reports that she does not drink alcohol or use drugs.  Family History  Problem Relation Age of Onset  . Alcohol abuse Maternal Aunt    Family Status  Relation Status  . Mother Deceased  . Father Deceased  . Maternal Aunt     Immunization History  Administered  Date(s) Administered  . Influenza,inj,Quad PF,36+ Mos 12/05/2015  . PPD Test 11/26/2015    Allergies  Allergen Reactions  . Lisinopril Swelling    Medications: Patient's Medications  New Prescriptions   No medications on file  Previous Medications   ACETAMINOPHEN (TYLENOL) 650 MG SUPPOSITORY    Place 1 suppository (650 mg total) rectally every 6 (six) hours as needed for mild pain (or Fever >/= 101).   ASPIRIN 300 MG SUPPOSITORY    Place 1 suppository (300 mg total) rectally daily.   BISACODYL (DULCOLAX) 10 MG SUPPOSITORY    Place 1 suppository (10 mg total) rectally as needed for moderate constipation.   LORAZEPAM (ATIVAN) 2 MG/ML CONCENTRATED SOLUTION    Take 0.5 mLs (1 mg total) by mouth every 8 (eight) hours as needed for anxiety, sedation or sleep (nausea).   MORPHINE SULFATE (MORPHINE CONCENTRATE) 10 MG/0.5ML SOLN CONCENTRATED SOLUTION    Place 0.25 mLs (5 mg total) under the tongue every 2 (two) hours as needed for moderate pain or shortness of breath.   SKIN PROTECTANTS, MISC. (CALAZIME SKIN PROTECTANT EX)    Apply 1 application topically 2 (two) times daily. TO BUTTOCKS  Modified Medications   No medications on file  Discontinued Medications   No medications on file    Review of Systems  Unable to perform ROS: Patient nonverbal    Vitals:   12/09/15 1124  BP: (!) 88/65  Pulse: (!) 59  Resp: (!) 36  Temp: 97.3 F (36.3 C)  TempSrc: Oral  SpO2: 90%  Weight: 119 lb 7.8 oz (54.2 kg)  Height: _0  (1.626 m)   Body mass index is 20.51 kg/m.  Physical Exam  Constitutional: She appears well-developed. She appears lethargic.  Frail appearing with labored breathing, lying in bed. [place] O2 intact. Looks ill  HENT:  MM dry  Eyes: Pupils are equal, round, and reactive to light. No scleral icterus.  Neck: Neck supple. Carotid bruit is not present. No tracheal deviation present. No thyromegaly present.  Cardiovascular: Normal rate and intact distal pulses.  An irregularly  irregular rhythm present. Exam reveals no gallop and no friction rub.   Murmur heard.  Systolic murmur is present with a grade of 1/6  No LE edema b/l. no calf TTP.   Pulmonary/Chest: Breath sounds normal. No stridor. She is in respiratory distress (labored breathing). She has no wheezes. She has no rales.  Abdominal: Soft. Bowel sounds are normal. She exhibits no distension and no mass. There is no hepatomegaly. There is no tenderness. There is no rebound and no guarding.  Musculoskeletal: She exhibits edema.  Lymphadenopathy:    She has no cervical adenopathy.  Neurological: She appears lethargic.  Skin: Skin is warm and dry. No rash noted.  Psychiatric: She has a normal mood and affect. Her behavior is normal.     Labs reviewed: Admission on 12/01/2015, Discharged on 12/08/2015  Component Date Value Ref Range Status  . Glucose-Capillary 12/01/2015 118* 65 - 99 mg/dL Final  . Comment 1 12/01/2015 Notify RN   Final  .  Sodium 12/01/2015 150* 135 - 145 mmol/L Final  . Potassium 12/01/2015 4.8  3.5 - 5.1 mmol/L Final  . Chloride 12/01/2015 115* 101 - 111 mmol/L Final  . BUN 12/01/2015 52* 6 - 20 mg/dL Final  . Creatinine, Ser 12/01/2015 1.00  0.44 - 1.00 mg/dL Final  . Glucose, Bld 12/01/2015 121* 65 - 99 mg/dL Final  . Calcium, Ion 12/01/2015 1.20  1.15 - 1.40 mmol/L Final  . TCO2 12/01/2015 26  0 - 100 mmol/L Final  . Hemoglobin 12/01/2015 11.2* 12.0 - 15.0 g/dL Final  . HCT 12/01/2015 33.0* 36.0 - 46.0 % Final  . Troponin i, poc 12/01/2015 0.24* 0.00 - 0.08 ng/mL Final  . Comment 12/01/2015 NOTIFIED PHYSICIAN   Final  . Comment 3 12/01/2015          Final   Comment: Due to the release kinetics of cTnI, a negative result within the first hours of the onset of symptoms does not rule out myocardial infarction with certainty. If myocardial infarction is still suspected, repeat the test at appropriate intervals.   . Sodium 12/01/2015 148* 135 - 145 mmol/L Final  . Potassium  12/01/2015 4.5  3.5 - 5.1 mmol/L Final  . Chloride 12/01/2015 116* 101 - 111 mmol/L Final  . CO2 12/01/2015 20* 22 - 32 mmol/L Final  . Glucose, Bld 12/01/2015 107* 65 - 99 mg/dL Final  . BUN 12/01/2015 35* 6 - 20 mg/dL Final  . Creatinine, Ser 12/01/2015 0.93  0.44 - 1.00 mg/dL Final  . Calcium 12/01/2015 9.5  8.9 - 10.3 mg/dL Final  . Total Protein 12/01/2015 7.8  6.5 - 8.1 g/dL Final  . Albumin 12/01/2015 3.5  3.5 - 5.0 g/dL Final  . AST 12/01/2015 40  15 - 41 U/L Final  . ALT 12/01/2015 22  14 - 54 U/L Final  . Alkaline Phosphatase 12/01/2015 43  38 - 126 U/L Final  . Total Bilirubin 12/01/2015 1.1  0.3 - 1.2 mg/dL Final  . GFR calc non Af Amer 12/01/2015 >60  >60 mL/min Final  . GFR calc Af Amer 12/01/2015 >60  >60 mL/min Final   Comment: (NOTE) The eGFR has been calculated using the CKD EPI equation. This calculation has not been validated in all clinical situations. eGFR's persistently <60 mL/min signify possible Chronic Kidney Disease.   . Anion gap 12/01/2015 12  5 - 15 Final  . B Natriuretic Peptide 12/01/2015 >4500.0* 0.0 - 100.0 pg/mL Final  . Lactic Acid, Venous 12/01/2015 3.07* 0.5 - 1.9 mmol/L Final  . Comment 12/01/2015 NOTIFIED PHYSICIAN   Final  . Color, Urine 12/01/2015 YELLOW  YELLOW Final  . APPearance 12/01/2015 CLOUDY* CLEAR Final  . Specific Gravity, Urine 12/01/2015 1.007  1.005 - 1.030 Final  . pH 12/01/2015 5.0  5.0 - 8.0 Final  . Glucose, UA 12/01/2015 NEGATIVE  NEGATIVE mg/dL Final  . Hgb urine dipstick 12/01/2015 NEGATIVE  NEGATIVE Final  . Bilirubin Urine 12/01/2015 NEGATIVE  NEGATIVE Final  . Ketones, ur 12/01/2015 NEGATIVE  NEGATIVE mg/dL Final  . Protein, ur 12/01/2015 NEGATIVE  NEGATIVE mg/dL Final  . Nitrite 12/01/2015 NEGATIVE  NEGATIVE Final  . Leukocytes, UA 12/01/2015 NEGATIVE  NEGATIVE Final  . pH, Ven 12/01/2015 7.300  7.250 - 7.430 Final  . pCO2, Ven 12/01/2015 50.0  44.0 - 60.0 mmHg Final  . pO2, Ven 12/01/2015 41.0  32.0 - 45.0 mmHg  Final  . Bicarbonate 12/01/2015 24.6  20.0 - 28.0 mmol/L Final  . TCO2 12/01/2015 26  0 -  100 mmol/L Final  . O2 Saturation 12/01/2015 70.0  % Final  . Acid-base deficit 12/01/2015 2.0  0.0 - 2.0 mmol/L Final  . Patient temperature 12/01/2015 HIDE   Final  . Sample type 12/01/2015 VENOUS   Final  . Lactic Acid, Venous 12/01/2015 1.76  0.5 - 1.9 mmol/L Final  . Glucose-Capillary 12/01/2015 130* 65 - 99 mg/dL Final  . Glucose-Capillary 12/02/2015 88  65 - 99 mg/dL Final  . Glucose-Capillary 12/02/2015 115* 65 - 99 mg/dL Final  . Glucose-Capillary 12/02/2015 89  65 - 99 mg/dL Final  . Glucose-Capillary 12/02/2015 105* 65 - 99 mg/dL Final  . Glucose-Capillary 12/04/2015 86  65 - 99 mg/dL Final  . Glucose-Capillary 12/05/2015 128* 65 - 99 mg/dL Final  . WBC 12/05/2015 4.5  4.0 - 10.5 K/uL Final  . RBC 12/05/2015 4.58  3.87 - 5.11 MIL/uL Final  . Hemoglobin 12/05/2015 11.5* 12.0 - 15.0 g/dL Final  . HCT 12/05/2015 36.8  36.0 - 46.0 % Final  . MCV 12/05/2015 80.3  78.0 - 100.0 fL Final  . MCH 12/05/2015 25.1* 26.0 - 34.0 pg Final  . MCHC 12/05/2015 31.3  30.0 - 36.0 g/dL Final  . RDW 12/05/2015 19.4* 11.5 - 15.5 % Final  . Platelets 12/05/2015 183  150 - 400 K/uL Final  . Sodium 12/05/2015 147* 135 - 145 mmol/L Final  . Potassium 12/05/2015 4.8  3.5 - 5.1 mmol/L Final  . Chloride 12/05/2015 111  101 - 111 mmol/L Final  . CO2 12/05/2015 25  22 - 32 mmol/L Final  . Glucose, Bld 12/05/2015 130* 65 - 99 mg/dL Final  . BUN 12/05/2015 50* 6 - 20 mg/dL Final  . Creatinine, Ser 12/05/2015 1.50* 0.44 - 1.00 mg/dL Final  . Calcium 12/05/2015 9.6  8.9 - 10.3 mg/dL Final  . Total Protein 12/05/2015 8.5* 6.5 - 8.1 g/dL Final  . Albumin 12/05/2015 3.5  3.5 - 5.0 g/dL Final  . AST 12/05/2015 56* 15 - 41 U/L Final  . ALT 12/05/2015 27  14 - 54 U/L Final  . Alkaline Phosphatase 12/05/2015 39  38 - 126 U/L Final  . Total Bilirubin 12/05/2015 0.9  0.3 - 1.2 mg/dL Final  . GFR calc non Af Amer 12/05/2015  34* >60 mL/min Final  . GFR calc Af Amer 12/05/2015 39* >60 mL/min Final   Comment: (NOTE) The eGFR has been calculated using the CKD EPI equation. This calculation has not been validated in all clinical situations. eGFR's persistently <60 mL/min signify possible Chronic Kidney Disease.   . Anion gap 12/05/2015 11  5 - 15 Final  . Lactic Acid, Venous 12/05/2015 2.8* 0.5 - 1.9 mmol/L Final   Comment: CRITICAL RESULT CALLED TO, READ BACK BY AND VERIFIED WITH: L TURNER,RN 1155 12/05/15 D BRADLEY   . Glucose-Capillary 12/05/2015 107* 65 - 99 mg/dL Final  . Glucose-Capillary 12/06/2015 109* 65 - 99 mg/dL Final  . Glucose-Capillary 12/06/2015 118* 65 - 99 mg/dL Final  . Glucose-Capillary 12/06/2015 119* 65 - 99 mg/dL Final  . Sodium 12/07/2015 147* 135 - 145 mmol/L Final  . Potassium 12/07/2015 4.7  3.5 - 5.1 mmol/L Final  . Chloride 12/07/2015 110  101 - 111 mmol/L Final  . CO2 12/07/2015 25  22 - 32 mmol/L Final  . Glucose, Bld 12/07/2015 346* 65 - 99 mg/dL Final  . BUN 12/07/2015 50* 6 - 20 mg/dL Final  . Creatinine, Ser 12/07/2015 1.33* 0.44 - 1.00 mg/dL Final  . Calcium 12/07/2015 8.9  8.9 -  10.3 mg/dL Final  . GFR calc non Af Amer 12/07/2015 39* >60 mL/min Final  . GFR calc Af Amer 12/07/2015 45* >60 mL/min Final   Comment: (NOTE) The eGFR has been calculated using the CKD EPI equation. This calculation has not been validated in all clinical situations. eGFR's persistently <60 mL/min signify possible Chronic Kidney Disease.   . Anion gap 12/07/2015 12  5 - 15 Final  . Glucose-Capillary 12/06/2015 106* 65 - 99 mg/dL Final  . Glucose-Capillary 12/07/2015 122* 65 - 99 mg/dL Final  . Glucose-Capillary 12/07/2015 113* 65 - 99 mg/dL Final  . Glucose-Capillary 12/07/2015 137* 65 - 99 mg/dL Final  . WBC 12/07/2015 4.9  4.0 - 10.5 K/uL Final  . RBC 12/07/2015 4.28  3.87 - 5.11 MIL/uL Final  . Hemoglobin 12/07/2015 10.6* 12.0 - 15.0 g/dL Final  . HCT 12/07/2015 35.4* 36.0 - 46.0 % Final   . MCV 12/07/2015 82.7  78.0 - 100.0 fL Final  . MCH 12/07/2015 24.8* 26.0 - 34.0 pg Final  . MCHC 12/07/2015 29.9* 30.0 - 36.0 g/dL Final  . RDW 12/07/2015 19.3* 11.5 - 15.5 % Final  . Platelets 12/07/2015 227  150 - 400 K/uL Final  . Prothrombin Time 12/07/2015 18.3* 11.4 - 15.2 seconds Final  . INR 12/07/2015 1.50   Final  . Glucose-Capillary 12/07/2015 135* 65 - 99 mg/dL Final  . Glucose-Capillary 12/07/2015 126* 65 - 99 mg/dL Final  . Glucose-Capillary 12/07/2015 101* 65 - 99 mg/dL Final  . Glucose-Capillary 12/08/2015 96  65 - 99 mg/dL Final  . Glucose-Capillary 12/08/2015 115* 65 - 99 mg/dL Final  Admission on 11/25/2015, Discharged on 11/30/2015  No results displayed because visit has over 200 results.    Hospital Outpatient Visit on 11/12/2015  Component Date Value Ref Range Status  . WBC 11/12/2015 4.3  4.0 - 10.5 K/uL Final  . RBC 11/12/2015 4.06  3.87 - 5.11 MIL/uL Final  . Hemoglobin 11/12/2015 10.4* 12.0 - 15.0 g/dL Final  . HCT 11/12/2015 32.5* 36.0 - 46.0 % Final  . MCV 11/12/2015 80.0  78.0 - 100.0 fL Final  . MCH 11/12/2015 25.6* 26.0 - 34.0 pg Final  . MCHC 11/12/2015 32.0  30.0 - 36.0 g/dL Final  . RDW 11/12/2015 19.5* 11.5 - 15.5 % Final  . Platelets 11/12/2015 223  150 - 400 K/uL Final  . Sodium 11/12/2015 142  135 - 145 mmol/L Final  . Potassium 11/12/2015 3.8  3.5 - 5.1 mmol/L Final  . Chloride 11/12/2015 112* 101 - 111 mmol/L Final  . CO2 11/12/2015 25  22 - 32 mmol/L Final  . Glucose, Bld 11/12/2015 101* 65 - 99 mg/dL Final  . BUN 11/12/2015 20  6 - 20 mg/dL Final  . Creatinine, Ser 11/12/2015 0.97  0.44 - 1.00 mg/dL Final  . Calcium 11/12/2015 8.9  8.9 - 10.3 mg/dL Final  . Total Protein 11/12/2015 7.7  6.5 - 8.1 g/dL Final  . Albumin 11/12/2015 3.4* 3.5 - 5.0 g/dL Final  . AST 11/12/2015 27  15 - 41 U/L Final  . ALT 11/12/2015 11* 14 - 54 U/L Final  . Alkaline Phosphatase 11/12/2015 37* 38 - 126 U/L Final  . Total Bilirubin 11/12/2015 0.5  0.3 - 1.2  mg/dL Final  . GFR calc non Af Amer 11/12/2015 57* >60 mL/min Final  . GFR calc Af Amer 11/12/2015 >60  >60 mL/min Final   Comment: (NOTE) The eGFR has been calculated using the CKD EPI equation. This calculation has not  been validated in all clinical situations. eGFR's persistently <60 mL/min signify possible Chronic Kidney Disease.   . Anion gap 11/12/2015 5  5 - 15 Final  Nursing Home on 11/02/2015  Component Date Value Ref Range Status  . Microalb, Ur 10/02/2015 6.6   Final  . LDl/HDL Ratio 10/02/2015 2.4   Final  . Triglycerides 10/02/2015 60  40 - 160 mg/dL Final  . Cholesterol 10/02/2015 141  0 - 200 mg/dL Final  . HDL 10/02/2015 59  35 - 70 mg/dL Final  . LDL Cholesterol 10/02/2015 66  mg/dL Final  Hospital Outpatient Visit on 10/15/2015  Component Date Value Ref Range Status  . Sodium 10/15/2015 144  135 - 145 mmol/L Final  . Potassium 10/15/2015 4.0  3.5 - 5.1 mmol/L Final  . Chloride 10/15/2015 113* 101 - 111 mmol/L Final  . CO2 10/15/2015 24  22 - 32 mmol/L Final  . Glucose, Bld 10/15/2015 108* 65 - 99 mg/dL Final  . BUN 10/15/2015 31* 6 - 20 mg/dL Final  . Creatinine, Ser 10/15/2015 0.97  0.44 - 1.00 mg/dL Final  . Calcium 10/15/2015 9.2  8.9 - 10.3 mg/dL Final  . Total Protein 10/15/2015 8.5* 6.5 - 8.1 g/dL Final  . Albumin 10/15/2015 4.1  3.5 - 5.0 g/dL Final  . AST 10/15/2015 31  15 - 41 U/L Final  . ALT 10/15/2015 16  14 - 54 U/L Final  . Alkaline Phosphatase 10/15/2015 33* 38 - 126 U/L Final  . Total Bilirubin 10/15/2015 1.2  0.3 - 1.2 mg/dL Final  . GFR calc non Af Amer 10/15/2015 57* >60 mL/min Final  . GFR calc Af Amer 10/15/2015 >60  >60 mL/min Final   Comment: (NOTE) The eGFR has been calculated using the CKD EPI equation. This calculation has not been validated in all clinical situations. eGFR's persistently <60 mL/min signify possible Chronic Kidney Disease.   . Anion gap 10/15/2015 7  5 - 15 Final  Admission on 10/11/2015, Discharged on 10/11/2015    Component Date Value Ref Range Status  . Specimen Description 10/14/2015 URINE, CATHETERIZED   Final  . Special Requests 10/14/2015 NONE   Final  . Culture 10/14/2015 *  Final                   Value:>=100,000 COLONIES/mL ESCHERICHIA COLI Confirmed Extended Spectrum Beta-Lactamase Producer (ESBL)   . Report Status 10/14/2015 10/14/2015 FINAL   Final  . Organism ID, Bacteria 10/14/2015 ESCHERICHIA COLI*  Final  . Color, Urine 10/11/2015 YELLOW  YELLOW Final  . APPearance 10/11/2015 CLOUDY* CLEAR Final  . Specific Gravity, Urine 10/11/2015 1.017  1.005 - 1.030 Final  . pH 10/11/2015 6.0  5.0 - 8.0 Final  . Glucose, UA 10/11/2015 NEGATIVE  NEGATIVE mg/dL Final  . Hgb urine dipstick 10/11/2015 NEGATIVE  NEGATIVE Final  . Bilirubin Urine 10/11/2015 NEGATIVE  NEGATIVE Final  . Ketones, ur 10/11/2015 NEGATIVE  NEGATIVE mg/dL Final  . Protein, ur 10/11/2015 NEGATIVE  NEGATIVE mg/dL Final  . Nitrite 10/11/2015 POSITIVE* NEGATIVE Final  . Leukocytes, UA 10/11/2015 SMALL* NEGATIVE Final  . Sodium 10/11/2015 142  135 - 145 mmol/L Final  . Potassium 10/11/2015 3.8  3.5 - 5.1 mmol/L Final  . Chloride 10/11/2015 111  101 - 111 mmol/L Final  . CO2 10/11/2015 24  22 - 32 mmol/L Final  . Glucose, Bld 10/11/2015 112* 65 - 99 mg/dL Final  . BUN 10/11/2015 20  6 - 20 mg/dL Final  . Creatinine, Ser 10/11/2015 0.92  0.44 - 1.00 mg/dL Final  . Calcium 10/11/2015 9.1  8.9 - 10.3 mg/dL Final  . GFR calc non Af Amer 10/11/2015 >60  >60 mL/min Final  . GFR calc Af Amer 10/11/2015 >60  >60 mL/min Final   Comment: (NOTE) The eGFR has been calculated using the CKD EPI equation. This calculation has not been validated in all clinical situations. eGFR's persistently <60 mL/min signify possible Chronic Kidney Disease.   . Anion gap 10/11/2015 7  5 - 15 Final  . WBC 10/11/2015 4.1  4.0 - 10.5 K/uL Final  . RBC 10/11/2015 4.29  3.87 - 5.11 MIL/uL Final  . Hemoglobin 10/11/2015 10.8* 12.0 - 15.0 g/dL Final  .  HCT 10/11/2015 35.0* 36.0 - 46.0 % Final  . MCV 10/11/2015 81.6  78.0 - 100.0 fL Final  . MCH 10/11/2015 25.2* 26.0 - 34.0 pg Final  . MCHC 10/11/2015 30.9  30.0 - 36.0 g/dL Final  . RDW 10/11/2015 16.7* 11.5 - 15.5 % Final  . Platelets 10/11/2015 176  150 - 400 K/uL Final  . Neutrophils Relative % 10/11/2015 67  % Final  . Neutro Abs 10/11/2015 2.7  1.7 - 7.7 K/uL Final  . Lymphocytes Relative 10/11/2015 27  % Final  . Lymphs Abs 10/11/2015 1.1  0.7 - 4.0 K/uL Final  . Monocytes Relative 10/11/2015 5  % Final  . Monocytes Absolute 10/11/2015 0.2  0.1 - 1.0 K/uL Final  . Eosinophils Relative 10/11/2015 1  % Final  . Eosinophils Absolute 10/11/2015 0.0  0.0 - 0.7 K/uL Final  . Basophils Relative 10/11/2015 0  % Final  . Basophils Absolute 10/11/2015 0.0  0.0 - 0.1 K/uL Final  . Squamous Epithelial / LPF 10/11/2015 0-5* NONE SEEN Final  . WBC, UA 10/11/2015 0-5  0 - 5 WBC/hpf Final  . RBC / HPF 10/11/2015 0-5  0 - 5 RBC/hpf Final  . Bacteria, UA 10/11/2015 MANY* NONE SEEN Final  Nursing Home on 10/01/2015  Component Date Value Ref Range Status  . Hemoglobin 09/06/2015 11.3* 12.0 - 16.0 g/dL Final  . HCT 09/06/2015 40  36 - 46 % Final  . Platelets 09/06/2015 199  150 - 399 K/L Final  . WBC 09/06/2015 3.2  10^3/mL Final  Lab on 09/09/2015  Component Date Value Ref Range Status  . Hemoglobin 09/06/2015 11.3* 12.0 - 16.0 g/dL Final  . HCT 09/06/2015 40  36 - 46 % Final  . Platelets 09/06/2015 199  150 - 399 K/L Final  . WBC 09/06/2015 3.2  10^3/mL Final    Ct Angio Head W Or Wo Contrast  Result Date: 11/27/2015 CLINICAL DATA:  Initial evaluation for acute stroke. EXAM: CT ANGIOGRAPHY HEAD AND NECK TECHNIQUE: Multidetector CT imaging of the head and neck was performed using the standard protocol during bolus administration of intravenous contrast. Multiplanar CT image reconstructions and MIPs were obtained to evaluate the vascular anatomy. Carotid stenosis measurements (when applicable)  are obtained utilizing NASCET criteria, using the distal internal carotid diameter as the denominator. CONTRAST:  50 cc of Isovue 370. COMPARISON:  Prior MRI from 11/26/2015. FINDINGS: CT HEAD Age-related cerebral atrophy present. Extensive chronic microvascular ischemic changes again seen. Scattered areas of encephalomalacia involving fourth cerebral hemispheres compatible with remote infarcts. Remote bilateral cerebellar infarcts present. Prominent vascular calcifications within the carotid siphons and vertebrobasilar system. Known small punctate acute infarcts not seen. No other acute large vessel territory infarct. No acute intracranial hemorrhage. No mass lesion, midline shift, or mass effect. No hydrocephalus.  No extra-axial fluid collection. Scalp soft tissues demonstrate no acute abnormality. No acute abnormality about the globes and orbits. The opacification with expansion of the left sphenoid sinus, likely related to mucocele. Paranasal sinuses are otherwise clear. No mastoid effusion. Calvarium intact. CTA NECK Aortic arch: Study limited by motion artifact. Visualized aortic arch of normal caliber with normal branch pattern. No high-grade stenosis at the origin of the great vessels. Scattered atheromatous plaque within the arch itself. Visualized subclavian arteries patent. Right carotid system: Right common carotid artery patent from its origin to the bifurcation without stenosis. Vessel tortuosity noted. Mild plaque about the right bifurcation without stenosis. Right ICA widely patent to the skullbase without stenosis, dissection, or occlusion. Left carotid system: Left common carotid artery patent from its origin to the bifurcation without high-grade stenosis. Mild plaque about the left bifurcation/ proximal left ICA without high-grade stenosis. Left ICA mildly tortuous but patent distally to the skullbase without stenosis, dissection, or occlusion. Vertebral arteries:Both vertebral arteries arise from  the subclavian arteries. Proximal vertebral arteries not well evaluated on this exam due to motion artifact. Vertebral arteries patent to the skullbase without obvious stenosis. Skeleton: Advanced multilevel degenerative spondylolysis within the cervical spine. No definite acute osseous abnormality. No worrisome lytic or blastic osseous lesions. Other neck: Visualized lungs are clear. Mild emphysema. Visualized mediastinum grossly unremarkable. Thyroid grossly normal. No definite acute soft tissue abnormality within the neck, although evaluation limited by motion. CTA HEAD Anterior circulation: Petrous segments patent bilaterally. Multifocal calcified plaque present within the cavernous/ supraclinoid ICAs bilaterally, left worse than right. There is associated moderate multi focal narrowing on the right (approximately up to 50% or so). Moderate to severe multi focal narrowing present on the left (50-75%). A1 segments patent. Right A1 segment slightly diminutive. Anterior communicating artery normal. Anterior cerebral arteries demonstrate multifocal atheromatous irregularity with stenoses, particularly on the right where the distal vessel is attenuated as compared to the left. M1 segments patent without stenosis or occlusion. MCA bifurcations normal. MCA branches patent proximally, and grossly symmetric. Distal small vessel atheromatous irregularity. Posterior circulation: Left vertebral artery dominant. Mild focal plaque within the left V4 segment without significant stenosis. Focal plaque within the diminutive right vertebral artery as it crosses the dural margin without significant stenosis. Additional focal plaque distally within the right V4 segment with mild to moderate stenosis. Right posterior inferior cerebral artery patent. Left posterior inferior cerebral artery not well visualized. Basilar artery tortuous but patent to its distal aspect. Focal plaque within the proximal- mid basilar artery with moderate  stenosis (series 502, image 352). Superior cerebral arteries patent. Posterior cerebral arteries arise from the basilar artery and are opacified to their distal aspects. Venous sinuses: Grossly patent, although not well evaluated on this exam due to motion artifact and timing of the contrast bolus. Anatomic variants: No anatomic variant. No aneurysm or vascular malformation. Delayed phase: No pathologic enhancement. IMPRESSION: CTA NECK IMPRESSION: 1. Mild atheromatous plaque about the carotid bifurcations bilaterally, left greater than right, without flow limiting stenosis. 2. Patent vertebral arteries within the neck. 3. Vessel tortuosity, suggestive of chronic underlying hypertension. 4. Mild emphysema. CTA HEAD IMPRESSION: 1. Negative for large vessel occlusion. 2. Multifocal atheromatous disease involving the anterior and posterior circulations as detailed above. Areas of involvement greatest within the cavernous ICAs, anterior cerebral arteries, and basilar artery. Electronically Signed   By: Jeannine Boga M.D.   On: 11/27/2015 06:20   Ct Head Wo Contrast  Result Date: 12/05/2015 CLINICAL DATA:  Pt was  found unresponsive on the floor of her home time down unknown She has left side weakness EXAM: CT HEAD WITHOUT CONTRAST TECHNIQUE: Contiguous axial images were obtained from the base of the skull through the vertex without intravenous contrast. COMPARISON:  12/01/2015 FINDINGS: Brain: No intracranial hemorrhage. No parenchymal contusion. No midline shift or mass effect. Basilar cisterns are patent. No skull base fracture. No fluid in the paranasal sinuses or mastoid air cells. Orbits are normal. New cortical hypodensity in the RIGHT frontotemporal lobe (image 19, series 201) within the territory of the RIGHT middle cerebral artery. Remote infarction within the LEFT middle cerebral artery territory again noted. Extensive periventricular subcortical white matter hypodensities unchanged. Atrophy and  proportional ventricular dilatation unchanged Vascular: Vascular calcification of the circle Willis vessels. Skull:  No skull fracture Sinuses/Orbits: Paranasal sinuses and mastoid air cells are clear. Orbits are clear. Other: None IMPRESSION: 1. New RIGHT middle cerebral artery territory infarction. 2. Remote LEFT middle cerebral artery territory infarction. 3. Extensive white matter microvascular disease. These results will be called to the ordering clinician or representative by the Radiologist Assistant, and communication documented in the PACS or zVision Dashboard. Electronically Signed   By: Suzy Bouchard M.D.   On: 12/05/2015 08:35   Ct Head Wo Contrast  Result Date: 12/01/2015 CLINICAL DATA:  Altered mental status. EXAM: CT HEAD WITHOUT CONTRAST TECHNIQUE: Contiguous axial images were obtained from the base of the skull through the vertex without intravenous contrast. COMPARISON:  CT brain 11/27/2015 FINDINGS: Brain: No evidence of acute infarction, hemorrhage, extra-axial collection, ventriculomegaly, or mass effect. Old left frontal lobe infarct with encephalomalacia. Old right posterior parietal lobe infarct. Generalized cerebral atrophy. Periventricular white matter low attenuation likely secondary to microangiopathy. Vascular: Cerebrovascular atherosclerotic calcifications are noted. Skull: Negative for fracture or focal lesion. Sinuses/Orbits: Visualized portions of the orbits are unremarkable. Visualized portions of the paranasal sinuses and mastoid air cells are unremarkable. Other: None. IMPRESSION: 1. No acute intracranial pathology. Electronically Signed   By: Kathreen Devoid   On: 12/01/2015 12:51   Ct Head Wo Contrast  Result Date: 11/25/2015 CLINICAL DATA:  Right-sided facial droop EXAM: CT HEAD WITHOUT CONTRAST TECHNIQUE: Contiguous axial images were obtained from the base of the skull through the vertex without intravenous contrast. COMPARISON:  October 11, 2015 FINDINGS: Brain: Mild  diffuse atrophy remain stable. There is no intracranial mass, hemorrhage, extra-axial fluid collection, or midline shift. There is extensive small vessel disease throughout the centra semiovale bilaterally. Small vessel disease is noted in each thalamus. There is evidence of a prior infarct in the superior posterior left frontal lobe. There is evidence of a prior infarct on the right at the parieto-occipital junction. There is evidence of a prior small infarct in the posterior superior right cerebellum. No new gray-white compartment lesions are evident. No acute infarct is demonstrable compared to prior study. Vascular: There is no hyperdense vessel appreciable. There are focal areas of calcification in the carotid siphons bilaterally as well as in the distal vertebral artery bilaterally and basilar artery regions. Skull: The bony calvarium appears intact. Sinuses/Orbits: Orbits appear symmetric bilaterally. There is opacification in the left sphenoid sinus. Other visualized paranasal sinuses are clear. Other: Mastoid air cells are clear. IMPRESSION: Atrophy with prior infarcts and extensive small vessel disease, stable. No acute infarct evident. No intracranial mass, hemorrhage, or extra-axial fluid collection. There are multiple foci of arterial vascular calcification. There is sphenoid sinus opacification. Electronically Signed   By: Lowella Grip III M.D.   On:  11/25/2015 11:28   Ct Angio Neck W Or Wo Contrast  Result Date: 11/27/2015 CLINICAL DATA:  Initial evaluation for acute stroke. EXAM: CT ANGIOGRAPHY HEAD AND NECK TECHNIQUE: Multidetector CT imaging of the head and neck was performed using the standard protocol during bolus administration of intravenous contrast. Multiplanar CT image reconstructions and MIPs were obtained to evaluate the vascular anatomy. Carotid stenosis measurements (when applicable) are obtained utilizing NASCET criteria, using the distal internal carotid diameter as the  denominator. CONTRAST:  50 cc of Isovue 370. COMPARISON:  Prior MRI from 11/26/2015. FINDINGS: CT HEAD Age-related cerebral atrophy present. Extensive chronic microvascular ischemic changes again seen. Scattered areas of encephalomalacia involving fourth cerebral hemispheres compatible with remote infarcts. Remote bilateral cerebellar infarcts present. Prominent vascular calcifications within the carotid siphons and vertebrobasilar system. Known small punctate acute infarcts not seen. No other acute large vessel territory infarct. No acute intracranial hemorrhage. No mass lesion, midline shift, or mass effect. No hydrocephalus. No extra-axial fluid collection. Scalp soft tissues demonstrate no acute abnormality. No acute abnormality about the globes and orbits. The opacification with expansion of the left sphenoid sinus, likely related to mucocele. Paranasal sinuses are otherwise clear. No mastoid effusion. Calvarium intact. CTA NECK Aortic arch: Study limited by motion artifact. Visualized aortic arch of normal caliber with normal branch pattern. No high-grade stenosis at the origin of the great vessels. Scattered atheromatous plaque within the arch itself. Visualized subclavian arteries patent. Right carotid system: Right common carotid artery patent from its origin to the bifurcation without stenosis. Vessel tortuosity noted. Mild plaque about the right bifurcation without stenosis. Right ICA widely patent to the skullbase without stenosis, dissection, or occlusion. Left carotid system: Left common carotid artery patent from its origin to the bifurcation without high-grade stenosis. Mild plaque about the left bifurcation/ proximal left ICA without high-grade stenosis. Left ICA mildly tortuous but patent distally to the skullbase without stenosis, dissection, or occlusion. Vertebral arteries:Both vertebral arteries arise from the subclavian arteries. Proximal vertebral arteries not well evaluated on this exam due  to motion artifact. Vertebral arteries patent to the skullbase without obvious stenosis. Skeleton: Advanced multilevel degenerative spondylolysis within the cervical spine. No definite acute osseous abnormality. No worrisome lytic or blastic osseous lesions. Other neck: Visualized lungs are clear. Mild emphysema. Visualized mediastinum grossly unremarkable. Thyroid grossly normal. No definite acute soft tissue abnormality within the neck, although evaluation limited by motion. CTA HEAD Anterior circulation: Petrous segments patent bilaterally. Multifocal calcified plaque present within the cavernous/ supraclinoid ICAs bilaterally, left worse than right. There is associated moderate multi focal narrowing on the right (approximately up to 50% or so). Moderate to severe multi focal narrowing present on the left (50-75%). A1 segments patent. Right A1 segment slightly diminutive. Anterior communicating artery normal. Anterior cerebral arteries demonstrate multifocal atheromatous irregularity with stenoses, particularly on the right where the distal vessel is attenuated as compared to the left. M1 segments patent without stenosis or occlusion. MCA bifurcations normal. MCA branches patent proximally, and grossly symmetric. Distal small vessel atheromatous irregularity. Posterior circulation: Left vertebral artery dominant. Mild focal plaque within the left V4 segment without significant stenosis. Focal plaque within the diminutive right vertebral artery as it crosses the dural margin without significant stenosis. Additional focal plaque distally within the right V4 segment with mild to moderate stenosis. Right posterior inferior cerebral artery patent. Left posterior inferior cerebral artery not well visualized. Basilar artery tortuous but patent to its distal aspect. Focal plaque within the proximal- mid basilar artery with moderate  stenosis (series 502, image 352). Superior cerebral arteries patent. Posterior cerebral  arteries arise from the basilar artery and are opacified to their distal aspects. Venous sinuses: Grossly patent, although not well evaluated on this exam due to motion artifact and timing of the contrast bolus. Anatomic variants: No anatomic variant. No aneurysm or vascular malformation. Delayed phase: No pathologic enhancement. IMPRESSION: CTA NECK IMPRESSION: 1. Mild atheromatous plaque about the carotid bifurcations bilaterally, left greater than right, without flow limiting stenosis. 2. Patent vertebral arteries within the neck. 3. Vessel tortuosity, suggestive of chronic underlying hypertension. 4. Mild emphysema. CTA HEAD IMPRESSION: 1. Negative for large vessel occlusion. 2. Multifocal atheromatous disease involving the anterior and posterior circulations as detailed above. Areas of involvement greatest within the cavernous ICAs, anterior cerebral arteries, and basilar artery. Electronically Signed   By: Jeannine Boga M.D.   On: 11/27/2015 06:20   Mr Brain Wo Contrast  Result Date: 12/05/2015 CLINICAL DATA:  Acute change in mental status, heart failure, shortness of breath, atrial fibrillation, history of seizures. EXAM: MRI HEAD WITHOUT CONTRAST TECHNIQUE: Multiplanar, multiecho pulse sequences of the brain and surrounding structures were obtained without intravenous contrast. COMPARISON:  MRI brain 11/26/2015. FINDINGS: Brain: Since the previous MR, the patient has developed a large area of confluent infarction in the RIGHT frontal and posterior frontal regions, affecting the cortex and white matter involving the frontal operculum, superior insula, and posterior frontal cortex almost to the vertex. A second smaller area of confluent RIGHT MCA territory ischemia affects the RIGHT parietal lobe. Resolving areas of punctate micro embolic acute infarction on the LEFT as demonstrated on the previous MR from 11/26/2015. No brainstem or posterior fossa involvement. Early hemorrhagic transformation of  the acute infarction as demonstrated on gradient sequence. No confluent lobar hematoma. Global atrophy with chronic microvascular ischemic change. Widespread areas of chronic infarction, most notable in the LEFT MCA territory affecting the frontal lobe. Chronic areas of infarction are redemonstrated in the cerebellum, occipital lobes, and elsewhere in the RIGHT hemisphere. 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 Vascular: Flow voids are maintained. Thick-walled dolichoectatic basilar artery is patent. Skull and upper cervical spine: Partial empty sella. No tonsillar herniation. Advanced cervical spondylosis. Sinuses/Orbits: Conjugate gaze to the RIGHT. BILATERAL cataract extraction. Slight layering fluid in the LEFT posterior ethmoids. Other: None. IMPRESSION: Acute regions of cerebral infarction within the RIGHT MCA territory, with the largest confluent area involving a RIGHT frontal and posterior frontal cortex and subcortical white matter. Early hemorrhagic transformation, gyriform/petechial, as seen on gradient sequence. These changes have developed since the previous MR of 11/26/2015. A call has been placed to the ordering provider to discuss the findings. Electronically Signed   By: Staci Righter M.D.   On: 12/05/2015 10:56   Mr Brain Wo Contrast  Result Date: 11/26/2015 CLINICAL DATA:  Previous history of stroke. Right facial droop and mental status changes beginning yesterday. EXAM: MRI HEAD WITHOUT CONTRAST TECHNIQUE: Multiplanar, multiecho pulse sequences of the brain and surrounding structures were obtained without intravenous contrast. COMPARISON:  Head CT 11/25/2015.  MRI 04/02/2014. FINDINGS: Brain: The study suffers from motion degradation. There are 3 or 4 scattered punctate foci of restricted diffusion in the left hemisphere in the middle cerebral artery territory consistent with micro embolic infarctions. No large  vessel territory acute infarction. The brainstem is normal. There are old small vessel cerebellar infarctions. There is extensive chronic small-vessel ischemic change affecting the thalamotomy, basal ganglia and throughout the cerebral  hemispheric white matter. There is an old cortical infarction at the left insula and posterior frontal region. There is old cortical infarction in both occipital regions right more than left. There is old cortical infarction in the right frontal region. No evidence of mass lesion. No sign of acute hemorrhage. There are scattered foci of hemosiderin deposition related to the old infarctions. No hydrocephalus. No extra-axial collection. No pituitary mass. Vascular: Major vessels at the base of the brain show flow. Skull and upper cervical spine: Negative Sinuses/Orbits: No significant sinus disease. No visible orbital pathology. Other: None IMPRESSION: Three or 4 scattered punctate acute infarctions in the left MCA territory consistent with micro embolic infarctions. No large infarction. No swelling or acute hemorrhage. 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 Extensive chronic small vessel ischemic changes elsewhere throughout the brain. Electronically Signed   By: Nelson Chimes M.D.   On: 11/26/2015 12:57   Dg Chest Port 1 View  Result Date: 12/01/2015 CLINICAL DATA:  Acute respiratory failure.  Atrial fibrillation. EXAM: PORTABLE CHEST 1 VIEW COMPARISON:  12/01/2015 FINDINGS: Stable moderate cardiomegaly.  Aortic atherosclerosis. Mild airspace disease is again seen in the lateral right lung base. No evidence of pulmonary edema or pleural effusion. IMPRESSION: No significant change in mild right basilar airspace opacity, suspicious for pneumonia. Stable cardiomegaly.  No evidence of congestive heart failure. Aortic atherosclerosis. Electronically Signed   By: Earle Gell M.D.   On: 12/01/2015 17:59   Dg Chest Portable 1 View  Result Date:  12/01/2015 CLINICAL DATA:  Altered mental status, low oxygen saturation, recent stroke and hospitalization, history diabetes mellitus, hypertension, atrial fibrillation, smoking EXAM: PORTABLE CHEST 1 VIEW COMPARISON:  Portable exam 1258 hours compared to 11/25/2015 FINDINGS: Enlargement of cardiac silhouette with pulmonary vascular congestion. Tortuous thoracic aorta with atherosclerotic calcification. Bibasilar subsegmental atelectasis greater on RIGHT. Lungs otherwise clear. No pleural effusion or pneumothorax. Probable BILATERAL chronic rotator cuff tears. IMPRESSION: Enlargement of cardiac silhouette with pulmonary vascular congestion. Bibasilar subsegmental atelectasis greater on RIGHT. Electronically Signed   By: Lavonia Dana M.D.   On: 12/01/2015 13:17   Dg Chest Port 1 View  Result Date: 11/25/2015 CLINICAL DATA:  Weakness, altered mental status, history of diabetes, atrial fibrillation, previous CVA, current smoker. EXAM: PORTABLE CHEST 1 VIEW COMPARISON:  Chest x-ray of April 02, 2014 FINDINGS: The lungs are adequately inflated. There is increased density at the right lung base. There is no pleural effusion. The cardiac silhouette is enlarged but stable. The pulmonary vascularity is normal. There is calcification in the wall of the thoracic aorta. The bony thorax exhibits no acute abnormality. IMPRESSION: Right lower lobe atelectasis or pneumonia. Followup PA and lateral chest X-ray is recommended in 3-4 weeks following trial of antibiotic therapy to ensure resolution and exclude underlying malignancy. Stable cardiomegaly without pulmonary vascular congestion. Aortic atherosclerosis. Electronically Signed   By: David  Martinique M.D.   On: 11/25/2015 11:04   Mr Jodene Nam Head/brain CN Cm  Result Date: 11/27/2015 CLINICAL DATA:  Acute left MCA territory infarcts.  Abnormal MRI. EXAM: MRA HEAD WITHOUT CONTRAST TECHNIQUE: Angiographic images of the Circle of Willis were obtained using MRA technique without  intravenous contrast. COMPARISON:  MRI brain 11/26/2015.  CTA head and neck 11/27/2015 FINDINGS: The study is moderately degraded by patient motion. There is good signal within the terminal ICA bilaterally. The A1 and M1 segments are intact. ACA and MCA branch vessels are poorly visualized. The proximal basilar artery is obscured. The left vertebral artery  is the dominant vessel. Distal basilar artery is intact. Both posterior cerebral arteries originate the basilar tip. There is moderate attenuation of PCA branch vessels. IMPRESSION: 1. Moderate medium and distal small vessel disease as previously seen. 2. Findings are significantly exaggerated by severe patient motion. 3. Accounting for artifact, no significant proximal stenosis, aneurysm, or branch vessel occlusion is evident. Electronically Signed   By: San Morelle M.D.   On: 11/27/2015 12:44     Assessment/Plan   ICD-9-CM ICD-10-CM   1. FTT (failure to thrive) in adult 783.7 R62.7   2. Cerebral infarction due to embolism of left middle cerebral artery (HCC) 434.11 I63.412   3. Acute respiratory failure with hypoxia ([place]) 518.81 J96.01    with recent HCAP tx  4. Systolic and diastolic CHF, chronic (HCC) 428.42 I50.42    428.0    5. Dysphagia S/P CVA (cerebrovascular accident) 438.82 I69.391   6. 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 Paroxysmal atrial fibrillation (HCC) 427.31 I48.0   9. Anemia, chronic disease 285.29 D63.8     Cont current meds for comfort only  GOAL is comfort care  Pt followed by hospice team  Will follow   Madasyn Heath S. Perlie Gold  [facility] [address] [place], [place] [address] [phone] Cell (Monday-Friday 8 AM - 5 PM) [phone] After 5 PM and follow prompts

## 2015-12-17 ENCOUNTER — Encounter (HOSPITAL_COMMUNITY): Admission: RE | Admit: 2015-12-17 | Payer: Medicare Other | Source: Ambulatory Visit

## 2015-12-17 NOTE — Progress Notes (Signed)
This encounter was created in error - please disregard.

## 2015-12-19 DEATH — deceased

## 2015-12-31 ENCOUNTER — Encounter (HOSPITAL_COMMUNITY): Payer: Self-pay

## 2016-01-14 ENCOUNTER — Encounter (HOSPITAL_COMMUNITY): Payer: Self-pay

## 2016-01-18 ENCOUNTER — Ambulatory Visit: Payer: Medicare Other | Admitting: Neurology

## 2016-01-28 ENCOUNTER — Encounter (HOSPITAL_COMMUNITY): Payer: Self-pay

## 2016-02-15 ENCOUNTER — Encounter (HOSPITAL_COMMUNITY): Payer: Self-pay

## 2016-02-29 ENCOUNTER — Encounter (HOSPITAL_COMMUNITY): Payer: Self-pay

## 2016-03-15 ENCOUNTER — Encounter (HOSPITAL_COMMUNITY): Payer: Self-pay

## 2016-06-15 IMAGING — CT CT HEAD W/O CM
1 of 2 series · 15 of 30 positions shown, 19 images · non-contrast
Comparison: Head CT 12/25/2003.  MRI brain 03/24/2013.

CLINICAL DATA: Unresponsive tonight.  Initial encounter.

EXAM:
CT HEAD WITHOUT CONTRAST
TECHNIQUE: Contiguous axial images were obtained from the base of the skull
through the vertex without intravenous contrast.

[Series 2: head 5.0 h30s · axial · 0.46mm/px · z∈[+1434,+1559]mm · 15 of 29 slices shown, 19 images]
[im 2/29  brain]
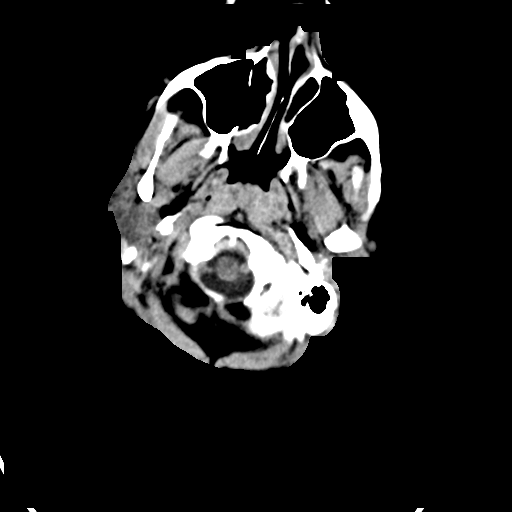
[im 2/29  bone]
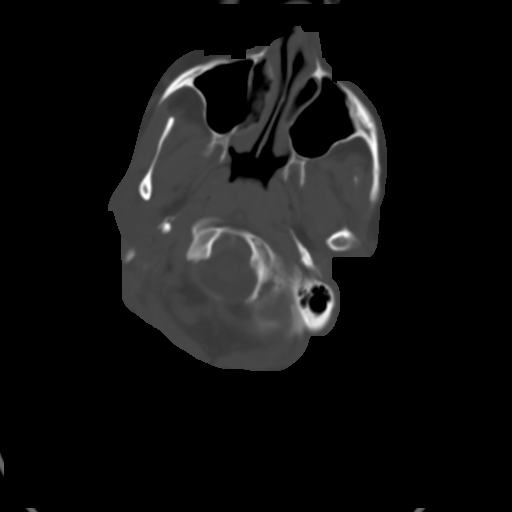
[im 4/29  brain]
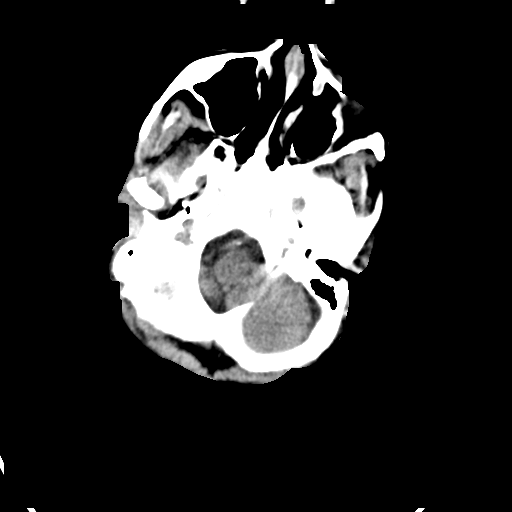
[im 6/29  brain]
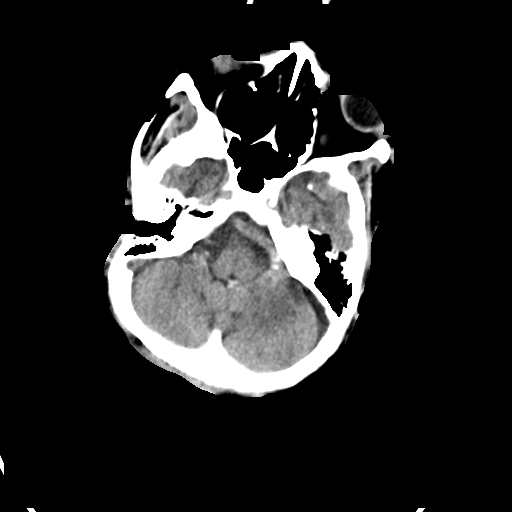
[im 8/29  brain]
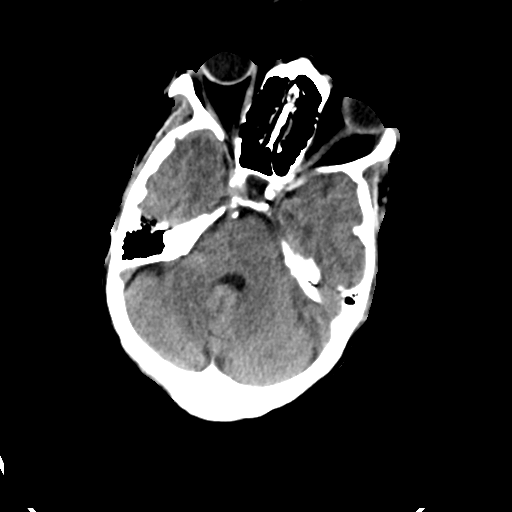
[im 9/29  brain]
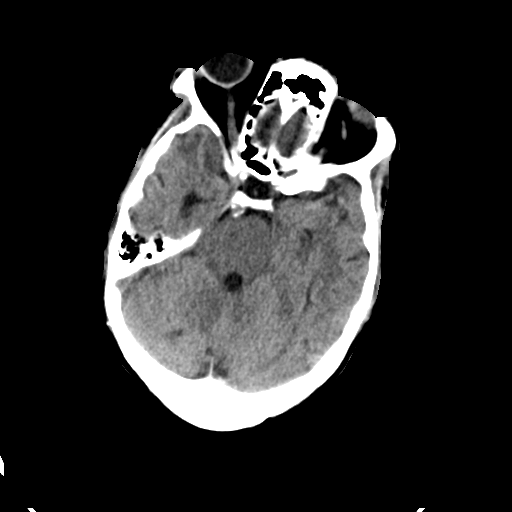
[im 9/29  bone]
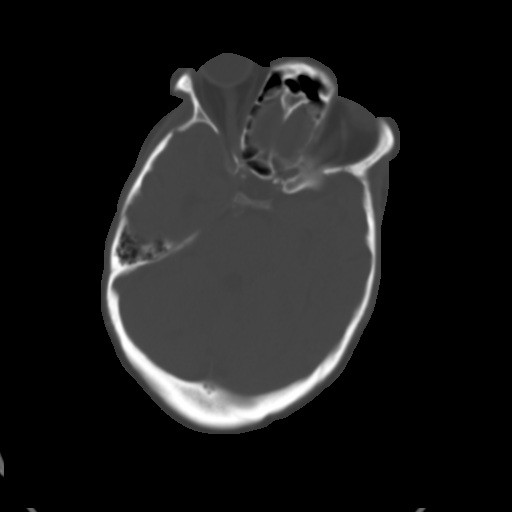
[im 11/29  brain]
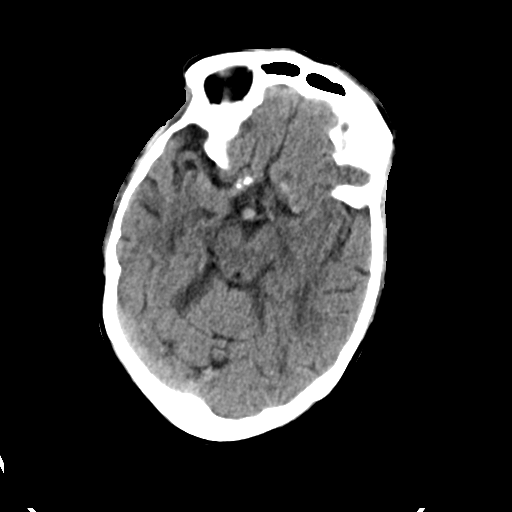
[im 13/29  brain]
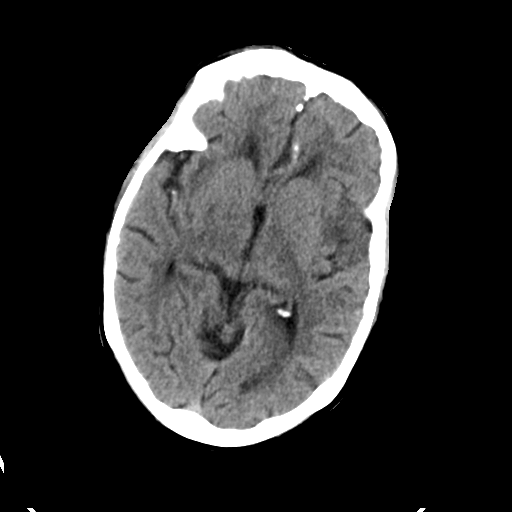
[im 15/29  brain]
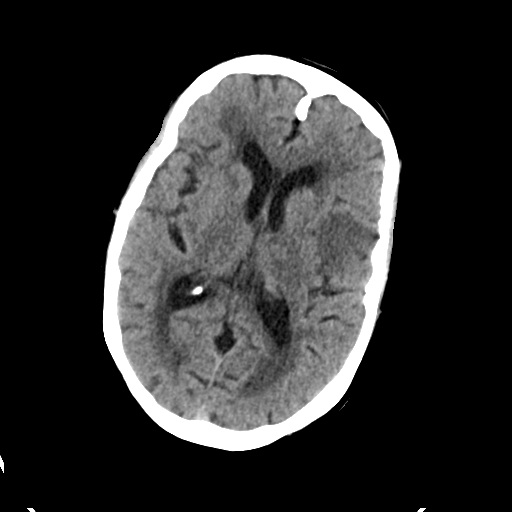
[im 16/29  brain]
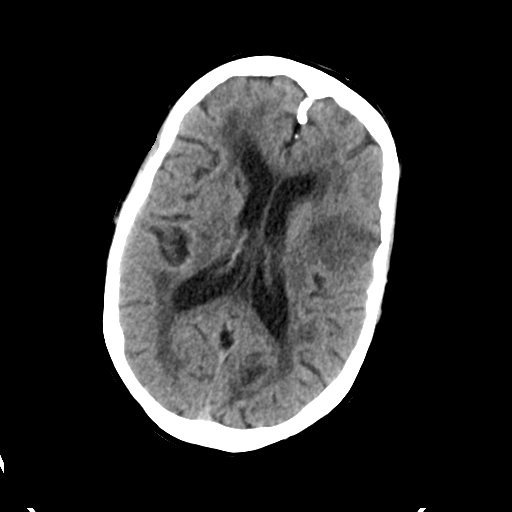
[im 16/29  bone]
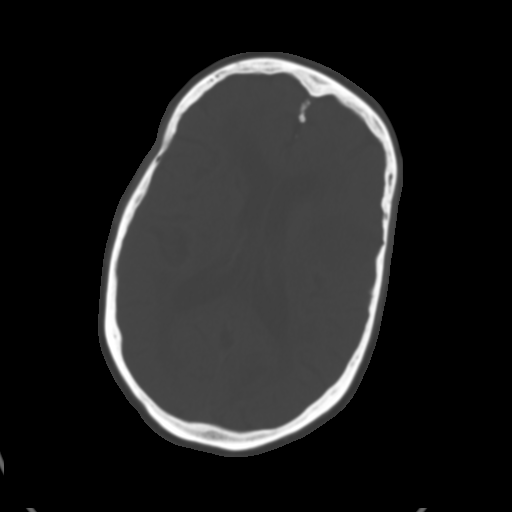
[im 18/29  brain]
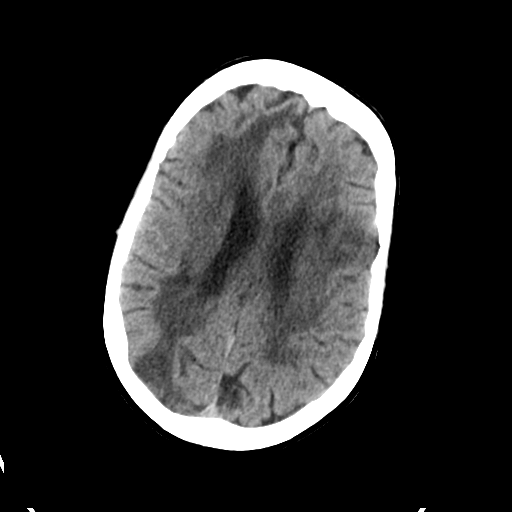
[im 20/29  brain]
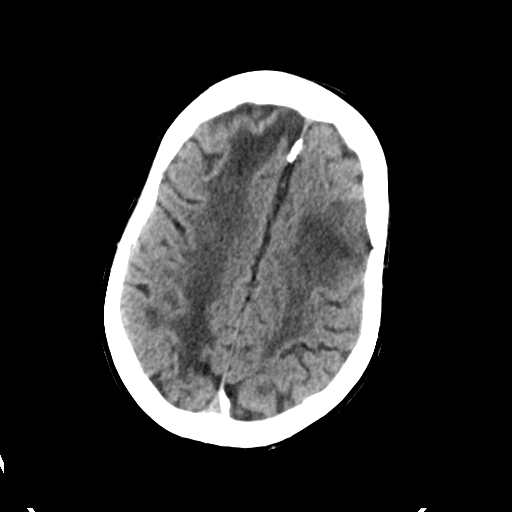
[im 22/29  brain]
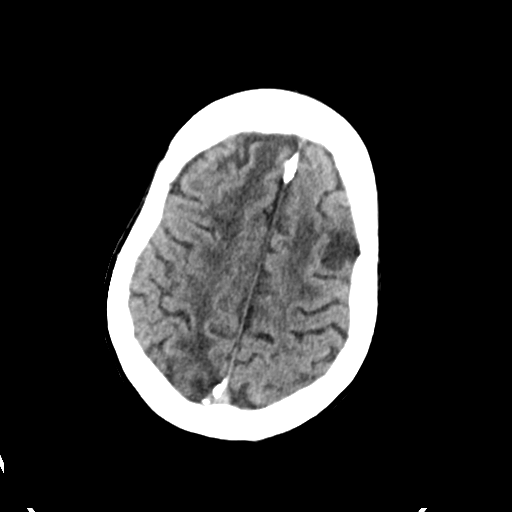
[im 23/29  brain]
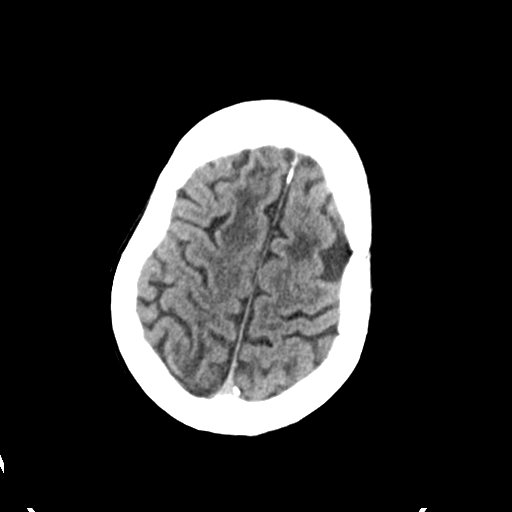
[im 23/29  bone]
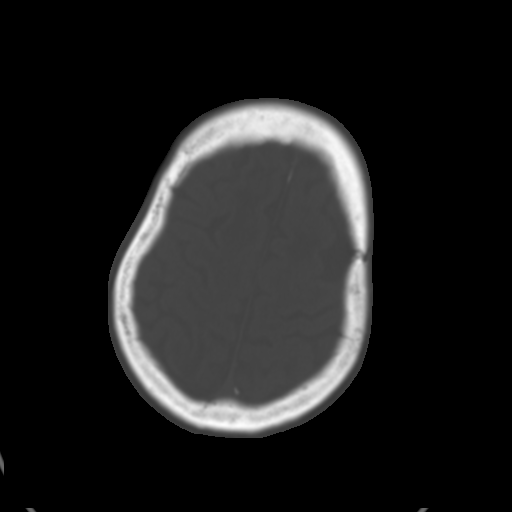
[im 25/29  brain]
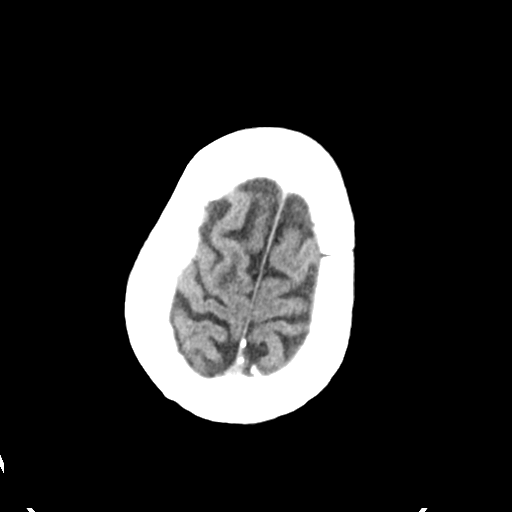
[im 27/29  brain]
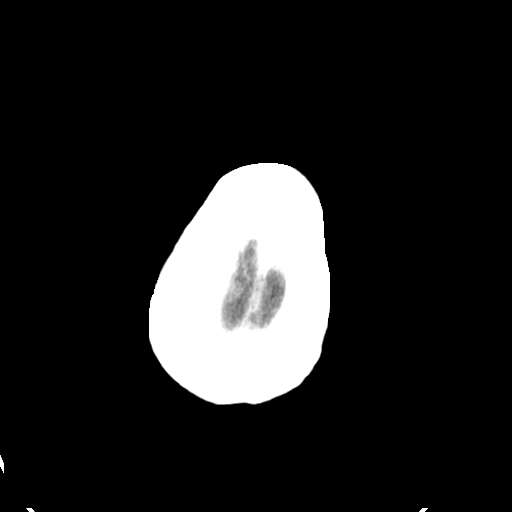

[15 of 30 positions shown; findings below may reference images not displayed]

FINDINGS: There is no evidence of acute intracranial hemorrhage, mass lesion,
brain edema or extra-axial fluid collection. The ventricles and
subarachnoid spaces are mildly prominent but stable. Extensive
chronic small vessel ischemic changes and old cortical infarcts are
again noted bilaterally. There is increased low-density and gyral
swelling in the left frontotemporal region (images 13-16) which
could reflect a subacute infarct. Intracranial vascular
calcifications again noted.

The visualized paranasal sinuses, mastoid air cells and middle ears
are clear. The calvarium is intact.
IMPRESSION: Extensive chronic ischemic changes bilaterally with possible
subacute extension in the left frontotemporal region. No evidence of
acute intracranial hemorrhage.

## 2016-06-15 IMAGING — CR DG CHEST 1V PORT
1 series · 1 of 1 positions shown · non-contrast
Comparison: 12/24/2013.

CLINICAL DATA: Altered mental status with weakness. Possible
stroke. Initial encounter.

EXAM:
PORTABLE CHEST - 1 VIEW

[AP]
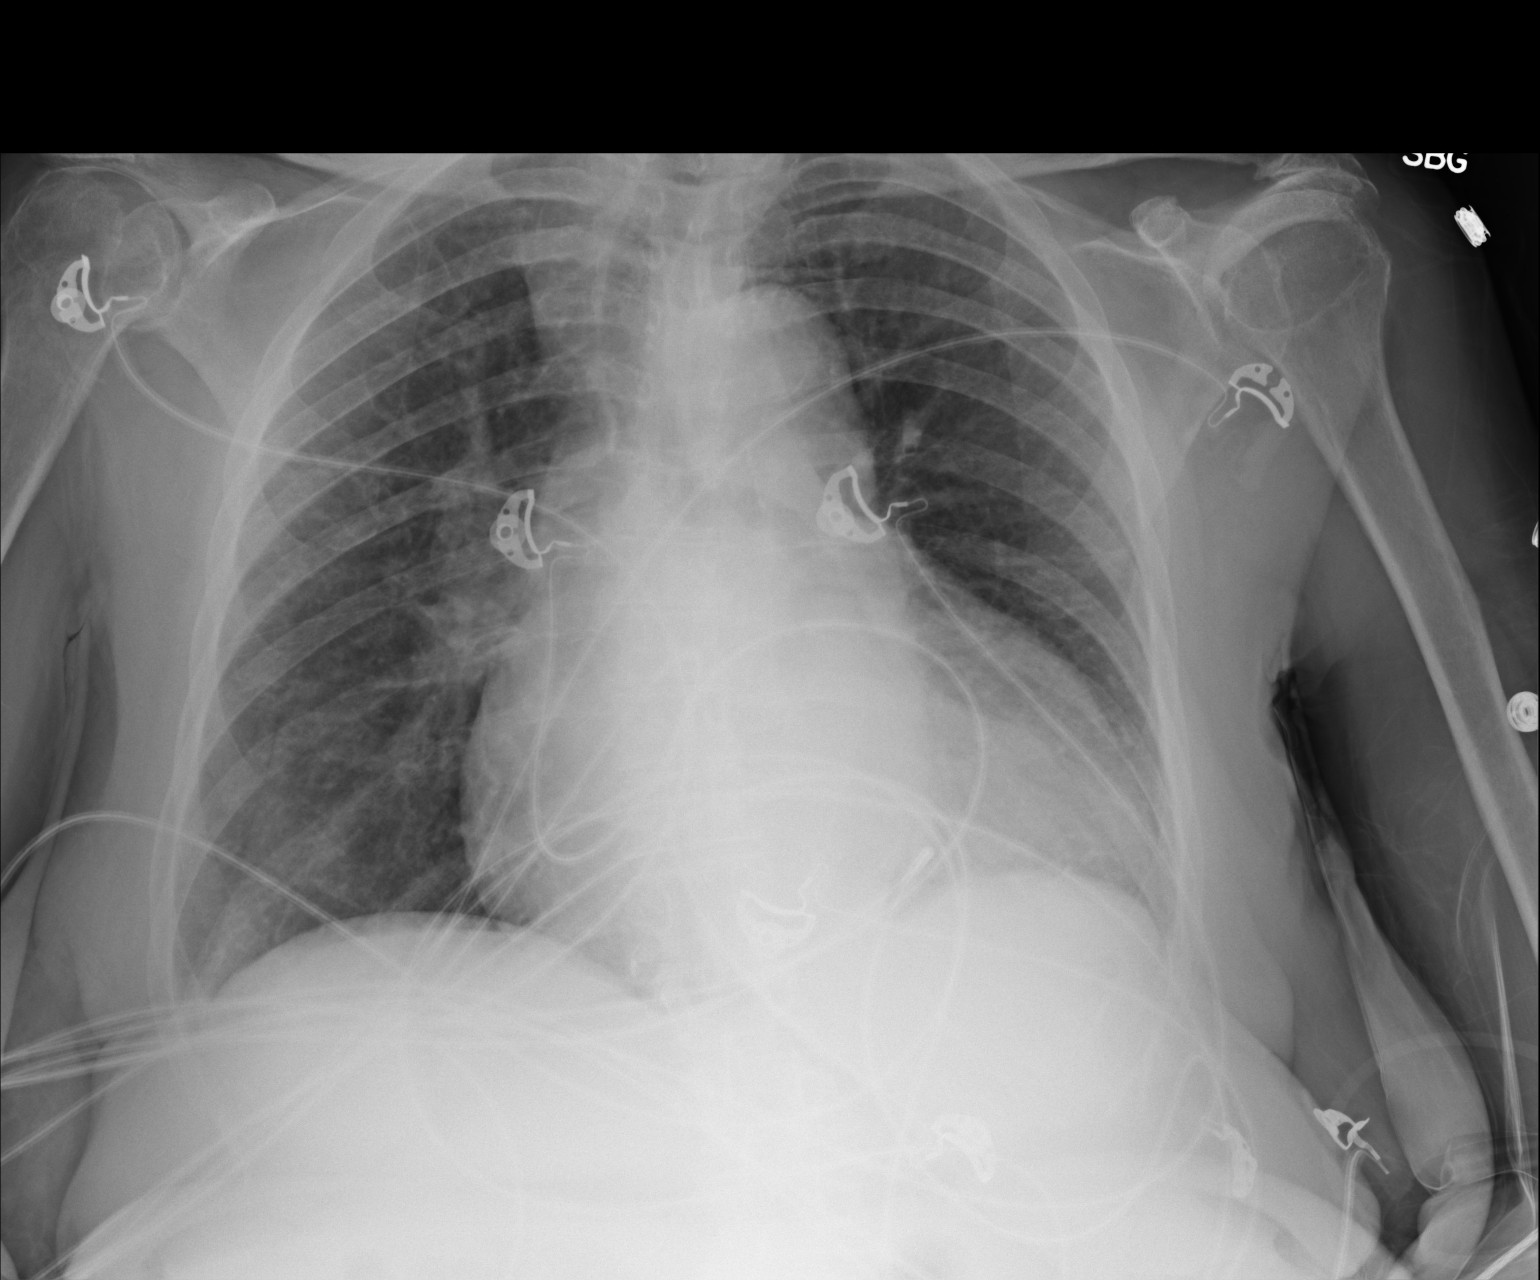

[1 of 1 positions shown; findings below may reference images not displayed]

FINDINGS: 1518 hr. There is stable cardiomegaly and vascular ectasia. The
lungs are clear. There is no pleural effusion or pneumothorax. No
acute osseous findings are seen. Multiple telemetry leads overlie
the chest.
IMPRESSION: Stable cardiomegaly.  No acute cardiopulmonary process demonstrated.
